# Patient Record
Sex: Male | Born: 1955 | Race: White | Hispanic: No | Marital: Married | Smoking: Former smoker
Health system: Southern US, Community
[De-identification: ages and names within clinical notes are randomized; demographics above are authoritative.]

## PROBLEM LIST (undated history)

## (undated) DIAGNOSIS — I2510 Atherosclerotic heart disease of native coronary artery without angina pectoris: Secondary | ICD-10-CM

## (undated) DIAGNOSIS — M1990 Unspecified osteoarthritis, unspecified site: Secondary | ICD-10-CM

## (undated) DIAGNOSIS — I509 Heart failure, unspecified: Secondary | ICD-10-CM

## (undated) DIAGNOSIS — J449 Chronic obstructive pulmonary disease, unspecified: Secondary | ICD-10-CM

## (undated) DIAGNOSIS — K219 Gastro-esophageal reflux disease without esophagitis: Secondary | ICD-10-CM

## (undated) DIAGNOSIS — IMO0001 Reserved for inherently not codable concepts without codable children: Secondary | ICD-10-CM

## (undated) DIAGNOSIS — J45909 Unspecified asthma, uncomplicated: Secondary | ICD-10-CM

## (undated) DIAGNOSIS — I4891 Unspecified atrial fibrillation: Secondary | ICD-10-CM

## (undated) DIAGNOSIS — I219 Acute myocardial infarction, unspecified: Secondary | ICD-10-CM

## (undated) DIAGNOSIS — I10 Essential (primary) hypertension: Secondary | ICD-10-CM

---

## 2003-08-09 HISTORY — PX: CHOLECYSTECTOMY: SHX55

## 2005-06-04 ENCOUNTER — Other Ambulatory Visit: Payer: Self-pay

## 2005-06-05 ENCOUNTER — Inpatient Hospital Stay: Payer: Self-pay | Admitting: Internal Medicine

## 2007-08-09 HISTORY — PX: CORONARY ANGIOGRAM: SHX5786

## 2007-08-09 HISTORY — PX: ANGIOPLASTY: SHX39

## 2007-08-26 ENCOUNTER — Other Ambulatory Visit: Payer: Self-pay

## 2007-08-27 ENCOUNTER — Other Ambulatory Visit: Payer: Self-pay

## 2007-08-27 ENCOUNTER — Inpatient Hospital Stay: Payer: Self-pay | Admitting: Internal Medicine

## 2007-08-30 ENCOUNTER — Other Ambulatory Visit: Payer: Self-pay

## 2007-09-03 ENCOUNTER — Other Ambulatory Visit: Payer: Self-pay

## 2007-09-18 ENCOUNTER — Ambulatory Visit: Payer: Self-pay | Admitting: Internal Medicine

## 2008-01-08 ENCOUNTER — Ambulatory Visit: Payer: Self-pay | Admitting: Internal Medicine

## 2008-01-17 ENCOUNTER — Ambulatory Visit: Payer: Self-pay | Admitting: Internal Medicine

## 2008-04-17 ENCOUNTER — Ambulatory Visit: Payer: Self-pay | Admitting: Internal Medicine

## 2010-12-17 ENCOUNTER — Ambulatory Visit: Payer: Self-pay

## 2011-01-06 ENCOUNTER — Ambulatory Visit: Payer: Self-pay

## 2011-06-01 ENCOUNTER — Inpatient Hospital Stay: Payer: Self-pay | Admitting: Internal Medicine

## 2011-06-01 ENCOUNTER — Ambulatory Visit: Payer: Self-pay | Admitting: Internal Medicine

## 2011-06-13 ENCOUNTER — Other Ambulatory Visit: Payer: Self-pay | Admitting: Nephrology

## 2013-07-18 ENCOUNTER — Inpatient Hospital Stay: Payer: Self-pay | Admitting: Internal Medicine

## 2013-07-18 LAB — CK TOTAL AND CKMB (NOT AT ARMC): CK, Total: 98 U/L (ref 35–232)

## 2013-07-18 LAB — COMPREHENSIVE METABOLIC PANEL
Albumin: 3.5 g/dL (ref 3.4–5.0)
Anion Gap: 7 (ref 7–16)
Bilirubin,Total: 1.3 mg/dL — ABNORMAL HIGH (ref 0.2–1.0)
Chloride: 101 mmol/L (ref 98–107)
Co2: 27 mmol/L (ref 21–32)
Creatinine: 0.76 mg/dL (ref 0.60–1.30)
EGFR (African American): >60
EGFR (Non-African Amer.): >60
Potassium: 3.8 mmol/L (ref 3.5–5.1)
SGPT (ALT): 32 U/L (ref 12–78)
Total Protein: 7.5 g/dL (ref 6.4–8.2)

## 2013-07-18 LAB — URINALYSIS, COMPLETE
Bacteria: NONE SEEN
Bilirubin,UR: NEGATIVE
Blood: NEGATIVE
Glucose,UR: NEGATIVE mg/dL (ref 0–75)
Nitrite: NEGATIVE
Specific Gravity: 1.013 (ref 1.003–1.030)

## 2013-07-18 LAB — CBC
HGB: 16.1 g/dL (ref 13.0–18.0)
MCH: 29.9 pg (ref 26.0–34.0)
MCHC: 33.1 g/dL (ref 32.0–36.0)
Platelet: 285 10*3/uL (ref 150–440)
RDW: 14.1 % (ref 11.5–14.5)
WBC: 15.5 10*3/uL — ABNORMAL HIGH (ref 3.8–10.6)

## 2013-07-18 LAB — LIPASE, BLOOD: Lipase: 861 U/L — ABNORMAL HIGH (ref 73–393)

## 2013-07-18 LAB — LIPID PANEL: Triglycerides: 53 mg/dL (ref 0–200)

## 2013-07-19 LAB — COMPREHENSIVE METABOLIC PANEL
Albumin: 2.8 g/dL — ABNORMAL LOW (ref 3.4–5.0)
Anion Gap: 5 — ABNORMAL LOW (ref 7–16)
Bilirubin,Total: 1.6 mg/dL — ABNORMAL HIGH (ref 0.2–1.0)
Calcium, Total: 8.4 mg/dL — ABNORMAL LOW (ref 8.5–10.1)
Chloride: 101 mmol/L (ref 98–107)
Co2: 29 mmol/L (ref 21–32)
Creatinine: 0.8 mg/dL (ref 0.60–1.30)
EGFR (Non-African Amer.): >60
Glucose: 86 mg/dL (ref 65–99)
Osmolality: 268 (ref 275–301)
Potassium: 3.9 mmol/L (ref 3.5–5.1)
SGPT (ALT): 32 U/L (ref 12–78)
Sodium: 135 mmol/L — ABNORMAL LOW (ref 136–145)
Total Protein: 6.6 g/dL (ref 6.4–8.2)

## 2013-07-19 LAB — CBC WITH DIFFERENTIAL/PLATELET
Basophil %: 0.3 %
Eosinophil %: 0.9 %
HCT: 42.2 % (ref 40.0–52.0)
HGB: 14.2 g/dL (ref 13.0–18.0)
Lymphocyte %: 16.7 %
MCH: 30.7 pg (ref 26.0–34.0)
MCHC: 33.7 g/dL (ref 32.0–36.0)
MCV: 91 fL (ref 80–100)
Monocyte %: 12.3 %
Neutrophil %: 69.8 %
RBC: 4.64 10*6/uL (ref 4.40–5.90)
WBC: 12.4 10*3/uL — ABNORMAL HIGH (ref 3.8–10.6)

## 2013-07-19 LAB — LIPASE, BLOOD: Lipase: 420 U/L — ABNORMAL HIGH (ref 73–393)

## 2013-07-19 LAB — MAGNESIUM: Magnesium: 1.5 mg/dL — ABNORMAL LOW

## 2013-07-29 ENCOUNTER — Other Ambulatory Visit: Payer: Self-pay | Admitting: Internal Medicine

## 2013-07-29 DIAGNOSIS — K219 Gastro-esophageal reflux disease without esophagitis: Secondary | ICD-10-CM

## 2013-09-04 ENCOUNTER — Other Ambulatory Visit: Payer: Self-pay

## 2014-04-23 ENCOUNTER — Ambulatory Visit: Payer: Self-pay

## 2014-07-08 ENCOUNTER — Ambulatory Visit: Payer: Self-pay | Admitting: Internal Medicine

## 2014-08-18 DIAGNOSIS — J439 Emphysema, unspecified: Secondary | ICD-10-CM | POA: Diagnosis not present

## 2014-08-18 DIAGNOSIS — J449 Chronic obstructive pulmonary disease, unspecified: Secondary | ICD-10-CM | POA: Diagnosis not present

## 2014-09-18 DIAGNOSIS — J449 Chronic obstructive pulmonary disease, unspecified: Secondary | ICD-10-CM | POA: Diagnosis not present

## 2014-09-18 DIAGNOSIS — J439 Emphysema, unspecified: Secondary | ICD-10-CM | POA: Diagnosis not present

## 2014-10-17 DIAGNOSIS — J439 Emphysema, unspecified: Secondary | ICD-10-CM | POA: Diagnosis not present

## 2014-10-17 DIAGNOSIS — J449 Chronic obstructive pulmonary disease, unspecified: Secondary | ICD-10-CM | POA: Diagnosis not present

## 2014-10-23 DIAGNOSIS — M064 Inflammatory polyarthropathy: Secondary | ICD-10-CM | POA: Diagnosis not present

## 2014-10-23 DIAGNOSIS — J449 Chronic obstructive pulmonary disease, unspecified: Secondary | ICD-10-CM | POA: Diagnosis not present

## 2014-10-23 DIAGNOSIS — Z9981 Dependence on supplemental oxygen: Secondary | ICD-10-CM | POA: Diagnosis not present

## 2014-10-23 DIAGNOSIS — I10 Essential (primary) hypertension: Secondary | ICD-10-CM | POA: Diagnosis not present

## 2014-11-12 DIAGNOSIS — J449 Chronic obstructive pulmonary disease, unspecified: Secondary | ICD-10-CM | POA: Diagnosis not present

## 2014-11-12 DIAGNOSIS — J9611 Chronic respiratory failure with hypoxia: Secondary | ICD-10-CM | POA: Diagnosis not present

## 2014-11-12 DIAGNOSIS — J309 Allergic rhinitis, unspecified: Secondary | ICD-10-CM | POA: Diagnosis not present

## 2014-11-12 DIAGNOSIS — R0602 Shortness of breath: Secondary | ICD-10-CM | POA: Diagnosis not present

## 2014-11-12 DIAGNOSIS — F17201 Nicotine dependence, unspecified, in remission: Secondary | ICD-10-CM | POA: Diagnosis not present

## 2014-11-17 DIAGNOSIS — J439 Emphysema, unspecified: Secondary | ICD-10-CM | POA: Diagnosis not present

## 2014-11-17 DIAGNOSIS — J449 Chronic obstructive pulmonary disease, unspecified: Secondary | ICD-10-CM | POA: Diagnosis not present

## 2014-11-28 NOTE — Consult Note (Signed)
PATIENT NAME:  Erik [name], Cotton L MR#:  [id number] DATE OF BIRTH:  Jul 17, 1956  DATE OF CONSULTATION:  07/18/2013  REFERRING PHYSICIAN:   CONSULTING PHYSICIAN:  Marcina MillardAlexander Keiasha Diep, MD  PRIMARY CARE PHYSICIAN: Beverely RisenFozia Khan, MD  PRIMARY CARDIOLOGIST: Arnoldo HookerBruce Kowalski, MD  CHIEF COMPLAINT: Abdominal pain.   REASON FOR CONSULTATION: Requested for evaluation of chest discomfort and atrial fibrillation.   HISTORY OF PRESENT ILLNESS: The patient is a 59 year old gentleman with known history of coronary artery disease status post prior MI and coronary stent as well as atrial fibrillation. The patient is noncompliant with his medications, has not taken any medications for over 7 months and has not seen his primary care physician or cardiologist. The patient reports a several day history of recurrent episodes of midepigastric discomfort described as burning sensation with mild reflux and abdominal distention. He presented [facility] Room where he was experiencing abdominal discomfort with some radiation into his chest. Admission labs were notable for negative troponin, elevated lipase of 86, and a white count of 15,000. EKG revealed atrial fibrillation with rapid ventricular rate.   PAST MEDICAL HISTORY: 1.  Coronary artery disease status post lateral MI, bare metal stent second obtuse marginal branch, 08/2007. 2.  Hypertension.  3.  Hyperlipidemia.  4.  Atrial fibrillation.  5.  Chronic obstructive pulmonary disease.  6.  Sleep apnea.   ADMISSION MEDICATIONS: None. The patient previously was taking Pradaxa 150 mg b.i.d., metoprolol ER 50 mg daily, Cartia XT 240 mg daily, Lasix 40 mg daily, and Bumetanide 1 daily.   SOCIAL HISTORY: The patient is married. He has a remote tobacco abuse history, quit 18 years ago.   FAMILY HISTORY: Father with known history of myocardial infarction.   REVIEW OF SYSTEMS: CONSTITUTIONAL: The patient has had some intermittent fever and chills.  EYES: No blurry  vision.  EARS: No hearing loss.  RESPIRATORY: The patient does have chronic exertional dyspnea.  CARDIOVASCULAR: The patient does have mid epigastric discomfort which radiates up into his chest. GASTROINTESTINAL: The patient has midepigastric discomfort, nausea and  vomiting.  GENITOURINARY: No dysuria or hematuria.  ENDOCRINE: No polyuria or polydipsia.  MUSCULOSKELETAL: No arthralgias or myalgias.  NEUROLOGICAL: No focal muscle weakness or numbness.  PSYCHOLOGICAL: No depression or anxiety.   PHYSICAL EXAMINATION: VITAL SIGNS: Blood pressure 149/79, pulse 85, respirations 18, temperature 97.3, and pulse ox 96%.  HEENT: Pupils equal and reactive to light and accommodation.  NECK: Supple without thyromegaly.  LUNGS: Clear.  HEART: Normal JVP. Normal PMI. Irregularly irregular rhythm. Normal S1 and S2. No appreciable gallop, murmur, or rub.  ABDOMEN: Soft and nontender.  EXTREMITIES: There was trace to 1+ bilateral pedal edema.  MUSCULOSKELETAL: Normal muscle tone.  NEUROLOGIC: The patient is alert and oriented x3. Motor and sensory both grossly intact.   IMPRESSION: A 59 year old gentleman with known coronary artery disease and atrial fibrillation. He has been noncompliant with his medications. The patient presents with mid epigastric discomfort and elevated lipase consistent with pancreatitis. The patient initially was hypertensive, which is now better controlled on current medications. The patient has a CHADS2 score of 1, previously on Pradaxa. Initial troponin is negative   RECOMMENDATIONS: 1.  Agree with overall current therapy. 2.  Would reinstate appropriate medications. The patient was previously on Cardizem and currently is on short acting Cardizem 30 mg daily p.o. q. 6. Once heart rate better tolerated, would switch to long-acting Cardizem CD 120 to 180 mg daily.  3.  Restart metoprolol succinate 50 mg daily.  4.  The patient has a CHADS2 score of 1. Could be on either aspirin or  chronic anticoagulation with warfarin or novel new anticoagulants. May consider continuing an aspirin since there is a question of compliance.  5.  Review 2-D echocardiogram.  6.  Further recommendations pending echocardiogram results.   ____________________________ Marcina Millard, MD ap:sb D: 07/18/2013 12:58:22 ET T: 07/18/2013 13:33:59 ET JOB#: [id number]  cc: Marcina Millard, MD, <Dictator> Marcina Millard MD ELECTRONICALLY SIGNED 07/26/2013 8:51

## 2014-11-28 NOTE — H&P (Signed)
PATIENT NAME:  Erik [name], Erik [name] MR#:  [id number] DATE OF BIRTH:  Oct 03, 1955  DATE OF ADMISSION:  07/18/2013  PRIMARY CARE PHYSICIAN:  Dr. Beverely Risen.   CARDIOLOGIST:  Dr. Gwen Pounds.   CHIEF COMPLAINT:  Chest pain and abdominal pain with indigestion.   HISTORY OF PRESENT ILLNESS:  This is a very nice 59 year old gentleman who has a history of multiple medical problems including chronic obstructive pulmonary disease, coronary artery disease, previous MI, hyperlipidemia, atrial fibrillation previously on Pradaxa and suspected sleep apnea. The patient comes today with a history of indigestion for the past 3 days, getting worse, but the wife states that for 7 days he has been actually feeling ingested. The patient states that last night he was not able to sleep due to the abdominal pain and indigestion. The abdominal pain was a burning sensation, radiating to the back with intensity of 5 or 6/10. The patient states that he had a little bit reflux on top of that and significant abdominal distention. The patient states that this morning, he started having actually chest pain located in the left lower area around the nipple area, deep inside, feeling like it was radiating down into the axilla. The intensity at the moment was 9/10. Right now it is 1 or 2/10. The patient had association with nausea but the nausea has been going on for 3 to 4 days. The patient states that there was not any diaphoresis associated with that. The patient had pain on his chest, which was cramp-like and the pain in the belly is burning like. The patient states that over the past 7 to 8 days, he has been having significant difficulty swallowing. He feels like something gets stuck in his chest. The patient is seen in the ER where his lipase is 886.  His cardiac enzymes are negative, and his white blood count is 15,000.  He has atrial fibrillation with RVR and has significant edema of the lower extremities with erythema, which is new within the  last 24 hours. He denies any fever but his white count is elevated. The patient states that he has been having a lot of family situations for which he has not take his medications in over 6 months. The patient is admitted for control and treatment of these situations.   REVIEW OF SYSTEMS:  A 12-system review of systems is done.  CONSTITUTIONAL:  No fever. Positive fatigue. Positive for shortness of breath, negative weight loss. Positive weight gain.  EYES:  No blurry vision, double vision or glaucoma.  ENT:  Positive difficulty swallowing foods. It gets stuck in his esophagus. No tinnitus. No epistaxis.  RESPIRATORY:  No cough. No wheezing at this moment but the patient has COPD and has significant exertional dyspnea.  CARDIOVASCULAR:  No orthopnea. Positive chest pain as mentioned above. Positive edemas. Positive arrhythmia. Positive palpitations, no syncope.  GASTROINTESTINAL:  Nausea, vomiting positive. No diarrhea. Positive abdominal pain as mentioned above. No constipation, diarrhea. The patient states that his stools a little bit hard, but he goes every day.  GENITOURINARY:  No dysuria, hematuria, changes in frequency.  ENDOCRINE:  No polyuria, polydipsia, polyphagia, cold or heat intolerance.  HEMATOLOGIC AND LYMPHATIC:  No anemia, easy bruising or swollen glands.  SKIN:  No rashes or petechiae.   NEUROLOGIC:  No numbness, tingling or CVAs.  PSYCHIATRIC:  No insomnia or depression.   PAST MEDICAL HISTORY:  1.  Coronary artery disease.  2.  Severe COPD.  3.  A history of previous MI.  4.  Hyperlipidemia.  5.  Chronic atrial fibrillation, supposed to be on anticoagulation with Pradaxa, but he is no longer taking.  6.  Sleep apnea, noncompliant with treatment.  7.  There is no documented history of CHF. Last echo in 2012 had a normal left ventricular ejection fraction of 50% with dilation of the atrium.   ALLERGIES:  BENADRYL and ZYRTEC, which apparently gave him anaphylaxis or  angioedema.   MEDICATIONS:  The patient is no longer taking any medications, but prior he was taking:  1.  Metoprolol ER 50. 2.  Cartia XT 240,  3.  Lasix 20 mg daily. 4.  Bumetanide. 5.  Pradaxa 150 twice daily but, again, he is no longer taking any of those.   FAMILY HISTORY:  Positive for a daughter 61 years old had a stroke. His father had a heart attack. There is history of lung cancer in his family as well.   PAST SURGICAL HISTORY:  1.  Cholecystectomy.  2.  Stent placement in coronary arteries.   SOCIAL HISTORY:  The patient is disabled due to his COPD. He states on the previous history that he was smoking two years ago, although the patient states that he has not smoked anything in 18 years. He lives with his wife.   PHYSICAL EXAMINATION:  VITAL SIGNS:  Blood pressures 150/81, pulse 117, respirations 24, temperature 98, oxygen saturation 100% on room air.  GENERAL:  Alert, oriented x 3, in no acute distress. No respiratory distress. Hemodynamically stable.  HEENT:  Pupils are equal and reactive. Extraocular movements are intact. Mucosa are moist. Anicteric sclerae. Pink conjunctivae. No oral lesions. No oropharyngeal exudates.   NECK:  Fat, supple. No JVD. No thyromegaly. No adenopathy. No rigidity.  CARDIOVASCULAR:  Irregular rate and rhythm. No murmurs, rubs or gallops are appreciated. No displacement of PMI. No tenderness to palpation anterior chest wall.  LUNGS:  Mostly clear without any wheezing or crepitus. No use of accessory muscles. No dullness to percussion.  ABDOMEN:  Soft, tender to palpation at the level of the epigastric area, but no rebound, no guarding. No hepatosplenomegaly. The patient has a very distended abdomen.  GENITAL:  Deferred.  EXTREMITIES:  Edema of the lower extremities with significant erythema on the pretibial areas, goes from the ankles to 2 inches below the knee. The patient states that this is new and it has been going on for the last 24 to 48  hours. The edemas are chronic.  VASCULAR:  Pulses +2. Capillary refill less than 3.  SKIN:  Erythema at the level of the pretibial areas as mentioned above. Plaque-like, no suppuration, the beginning of cellulitis. No petechiae.  LYMPHATIC:  Negative for lymphadenopathy in the neck or supraclavicular areas.  NEUROLOGIC:  Cranial nerves II through XII intact. Strength is 5/5 in all 4 extremities. No focal findings.  PSYCHIATRIC:  No significant agitation. Normal judgment. The patient is alert, oriented x 3.  MUSCULOSKELETAL:  No joint effusions or joint swelling.   LABORATORY, DIAGNOSTIC, AND RADIOLOGICAL DATA:  Glucose 122, sodium 135, creatinine 0.76. Lipase is 861, albumin is 1.3. LFTs within normal limits, otherwise. His white count is 15,000. His hemoglobin 16 and his platelet count is 285. Urinalysis is pending. Chest x-ray: changes of pulmonary disease, emphysematous changes, cardiomegaly, which is stable.   ASSESSMENT AND PLAN:  The patient is a very pleasant 59 year old gentleman, who presented to the ER with chest pain and abdominal digestion. 1.  Chest pain. Due to significant risk factors  and noncompliance with medication, we are going to try to rule out acute coronary syndrome, but the patient very likely has chest pain, which is secondary to radiation of the pain from the abdomen from pancreatitis, but again with his risk factors it is better to rule him out. We are going to do an echocardiogram. We are going to cycle cardiac enzymes. I am going to give him aspirin. Since the patient has severe indigestion, I am going to do buffered aspirin. We are going to give him a beta blocker, nitroglycerin and morphine as needed.  2.  Consultation with Dr. Gwen Pounds.  3.  Atrial fibrillation with rapid ventricular response. The patient has a heart rate in the 120s. At this moment, we are going to give him one dose of IV Cardizem. I think after that he will be able to go to the floor. I am going to put  him on IV metoprolol at 5 mg every 6 hours and start him on Cardizem 30 mg every 6 hours as well. With these interventions, the patient should be able to go to the telemetry floor.  4.  Since the patient has a CHADS score of around 3, we are going to give him full anticoagulation with Lovenox and monitor closely. His platelets are normal. The patient used to be on Pradaxa, has not taken it. He is at risk of stroke.  5.  As far as his abdominal pain and indigestion, the patient has pancreatitis. We are going to keep him n.p.o., IV fluids at 100 mL/hour, and monitor closely. The patient does not have a history congestive heart failure, but that was from an echocardiogram that was done 2 years ago when the patient was treatment. Right now he has not had any significant treatment or medications. We are going to repeat the echocardiogram and monitor for any fluid overload.  6.  Leukocytosis. The patient has erythema of the lower extremities, which is new, likely secondary to beginnings of cellulitis. He had leukocytosis in the past, seems to be chronic process for what we are going to follow up. I am going to start him on Rocephin for treatment of the cellulitis.  7.  Cellulitis. See above. Rocephin indicated at this moment.  8.  Chronic obstructive pulmonary disease. Continue nebulizers. The patient does not have any symptoms of exacerbation at this moment.  9.  Sleep apnea. The patient is intolerant to CPAP. He does not want to be on it.  10.  Hyperlipidemia. Check lipid profile. As far as the pancreatitis goes, the patient does not have gallbladder and he does not have a history of alcohol use for what we are going to rule out the possibility of hypertriglyceridemia. For now, we are going to treat his pancreatitis with IV fluids, symptomatic treatment.  11.  Deep vein thrombosis prophylaxis. The patient full dose of Lovenox.  12.  Gastrointestinal prophylaxis with Protonix.   TIME SPENT:  I spent about 50  minutes with this patient.   ____________________________ Felipa Furnace, MD rsg:jm D: 07/18/2013 11:11:39 ET T: 07/18/2013 11:44:01 ET JOB#: [id number]  cc: Felipa Furnace, MD, <Dictator> Nyellie Yetter Juanda Chance MD ELECTRONICALLY SIGNED 07/29/2013 12:25

## 2014-11-28 NOTE — Discharge Summary (Signed)
PATIENT NAME:  Erik [name], Erik [name] MR#:  [id number] DATE OF BIRTH:  13-May-1956  DATE OF ADMISSION:  07/18/2013 DATE OF DISCHARGE:  07/19/2013  PRIMARY CARE PROVIDER: Adrian BlackwaterShaukat Khan, MD  DISCHARGE DIAGNOSES:  1.  Acute pancreatitis, mild.  2.  Atrial fibrillation with rapid ventricular rate.  3.  Chronic respiratory failure.  4.  Chronic obstructive pulmonary disease.  5.  Noncompliance.   CONSULTS: Dr. Darrold JunkerParaschos of cardiology.   IMAGING STUDIES DONE: Include an echocardiogram which showed EF of 35% to 40%.   Chest x-ray, portable, shows no acute disease, emphysema.   ADMITTING HISTORY AND PHYSICAL: Please see detailed H and P dictated previously by Dr. Mordecai MaesSanchez. In brief, a 59 year old male patient with prior history of hypertension, systolic CHF, COPD, chronic respiratory failure, who presented to the hospital complaining of epigastric abdominal pain. The patient was found to have acute pancreatitis, also atrial fibrillation with rapid ventricular rate, chest pain. Admitted to the hospitalist service.   HOSPITAL COURSE:  1.  Acute pancreatitis, mild. The patient had lipase elevated at 810 which has trended down to 420. He has tolerated a regular diet with minimal pain and he is ready for discharge home. His ultrasound did not show any gallstones or dilation of CBD.  2.  Atrial fibrillation with rapid ventricular rate. The patient was supposed to be on Cartia and metoprolol but has been noncompliant with his medications for many days. This was restarted and his heart rate is less than 100. The patient will be on aspirin. Anticoagulation has been limited to aspirin not to Coumadin per Dr. Darrold JunkerParaschos' recommendation secondary to concern for noncompliance.  3.  Chronic systolic CHF, EF of 35% to 40%. The patient will be on a beta blocker, Lasix. No ACE inhibitors at this time secondary to borderline low blood pressures. The patient will be started on low-dose ACE inhibitors when he follows with his  cardiologist.  4.  COPD and chronic respiratory failure have been stable.  5.  The patient did have mild chest pain secondary to his pancreatitis. Dr. Darrold JunkerParaschos has cleared him from a cardiology standpoint.   Prior to discharge, the patient does not have any wheezing. Heart rate is 90 and the patient will be discharged home to follow up with his primary care physician.   DISCHARGE MEDICATIONS:  Include:  1.  Advair 250/50 inhaled twice a day.  2.  Spiriva 18 mcg inhaled once a day.  3.  Aspirin 325 mg daily.  4.  Metoprolol tartrate 50 mg oral twice a day.  5.  Cardizem CD 240 mg oral once a day.  6.  ProAir HFA 90 mcg per inhalation, 2 puffs inhaled 4 times a day as needed for wheezing.  7.  Lasix 20 mg daily.  8.  Amoxicillin clavulanate 875/125 mg oral 2 times a day.  9.  Acetaminophen oxycodone 325/5, one tablet oral 3 times a day as needed for pain.   DISCHARGE INSTRUCTIONS: Home oxygen with 3 liters of oxygen, continuous; low-sodium diet. Activity as tolerated.   FOLLOWUP: With Dr. Darrold JunkerParaschos of cardiology and primary care physician in 1 to 2 weeks.   TIME SPENT ON DAY OF DISCHARGE IN DISCHARGE ACTIVITY: Was 45 minutes.    ____________________________ Molinda BailiffSrikar R. Jamacia Jester, MD srs:np D: 07/19/2013 14:11:14 ET T: 07/19/2013 15:55:59 ET JOB#: [id number]  cc: Wardell HeathSrikar R. Elpidio AnisSudini, MD, <Dictator> Laurier NancyShaukat A. Khan, MD Marcina MillardAlexander Paraschos, MD Orie FishermanSRIKAR R Lene Mckay MD ELECTRONICALLY SIGNED 07/28/2013 10:37

## 2014-12-17 DIAGNOSIS — J439 Emphysema, unspecified: Secondary | ICD-10-CM | POA: Diagnosis not present

## 2014-12-17 DIAGNOSIS — J449 Chronic obstructive pulmonary disease, unspecified: Secondary | ICD-10-CM | POA: Diagnosis not present

## 2014-12-30 DIAGNOSIS — I10 Essential (primary) hypertension: Secondary | ICD-10-CM | POA: Diagnosis not present

## 2014-12-30 DIAGNOSIS — J440 Chronic obstructive pulmonary disease with acute lower respiratory infection: Secondary | ICD-10-CM | POA: Diagnosis not present

## 2014-12-30 DIAGNOSIS — Z9981 Dependence on supplemental oxygen: Secondary | ICD-10-CM | POA: Diagnosis not present

## 2014-12-30 DIAGNOSIS — J309 Allergic rhinitis, unspecified: Secondary | ICD-10-CM | POA: Diagnosis not present

## 2014-12-30 DIAGNOSIS — R0602 Shortness of breath: Secondary | ICD-10-CM | POA: Diagnosis not present

## 2015-01-17 DIAGNOSIS — J439 Emphysema, unspecified: Secondary | ICD-10-CM | POA: Diagnosis not present

## 2015-01-17 DIAGNOSIS — J449 Chronic obstructive pulmonary disease, unspecified: Secondary | ICD-10-CM | POA: Diagnosis not present

## 2015-02-02 ENCOUNTER — Ambulatory Visit
Admission: RE | Admit: 2015-02-02 | Discharge: 2015-02-02 | Disposition: A | Discharge status: Home / Self Care | Payer: Medicare Other | Source: Ambulatory Visit | Attending: Internal Medicine | Admitting: Internal Medicine

## 2015-02-02 ENCOUNTER — Other Ambulatory Visit: Payer: Self-pay | Admitting: Internal Medicine

## 2015-02-02 DIAGNOSIS — J440 Chronic obstructive pulmonary disease with acute lower respiratory infection: Secondary | ICD-10-CM | POA: Diagnosis not present

## 2015-02-02 DIAGNOSIS — R0602 Shortness of breath: Secondary | ICD-10-CM

## 2015-02-02 DIAGNOSIS — J449 Chronic obstructive pulmonary disease, unspecified: Secondary | ICD-10-CM | POA: Insufficient documentation

## 2015-02-02 DIAGNOSIS — R05 Cough: Secondary | ICD-10-CM | POA: Insufficient documentation

## 2015-02-02 DIAGNOSIS — Z9981 Dependence on supplemental oxygen: Secondary | ICD-10-CM | POA: Diagnosis not present

## 2015-02-02 DIAGNOSIS — J069 Acute upper respiratory infection, unspecified: Secondary | ICD-10-CM

## 2015-02-02 DIAGNOSIS — I2510 Atherosclerotic heart disease of native coronary artery without angina pectoris: Secondary | ICD-10-CM | POA: Diagnosis not present

## 2015-02-02 DIAGNOSIS — J309 Allergic rhinitis, unspecified: Secondary | ICD-10-CM | POA: Diagnosis not present

## 2015-02-05 DIAGNOSIS — R0602 Shortness of breath: Secondary | ICD-10-CM | POA: Diagnosis not present

## 2015-02-16 DIAGNOSIS — J449 Chronic obstructive pulmonary disease, unspecified: Secondary | ICD-10-CM | POA: Diagnosis not present

## 2015-02-16 DIAGNOSIS — J439 Emphysema, unspecified: Secondary | ICD-10-CM | POA: Diagnosis not present

## 2015-02-23 DIAGNOSIS — I10 Essential (primary) hypertension: Secondary | ICD-10-CM | POA: Diagnosis not present

## 2015-02-23 DIAGNOSIS — E782 Mixed hyperlipidemia: Secondary | ICD-10-CM | POA: Diagnosis not present

## 2015-02-23 DIAGNOSIS — I4891 Unspecified atrial fibrillation: Secondary | ICD-10-CM | POA: Diagnosis not present

## 2015-02-23 DIAGNOSIS — I2510 Atherosclerotic heart disease of native coronary artery without angina pectoris: Secondary | ICD-10-CM | POA: Diagnosis not present

## 2015-02-26 DIAGNOSIS — R079 Chest pain, unspecified: Secondary | ICD-10-CM | POA: Diagnosis not present

## 2015-03-02 DIAGNOSIS — I25119 Atherosclerotic heart disease of native coronary artery with unspecified angina pectoris: Secondary | ICD-10-CM | POA: Diagnosis not present

## 2015-03-02 DIAGNOSIS — I2510 Atherosclerotic heart disease of native coronary artery without angina pectoris: Secondary | ICD-10-CM | POA: Insufficient documentation

## 2015-03-12 DIAGNOSIS — J9611 Chronic respiratory failure with hypoxia: Secondary | ICD-10-CM | POA: Diagnosis not present

## 2015-03-12 DIAGNOSIS — I279 Pulmonary heart disease, unspecified: Secondary | ICD-10-CM | POA: Diagnosis not present

## 2015-03-12 DIAGNOSIS — J449 Chronic obstructive pulmonary disease, unspecified: Secondary | ICD-10-CM | POA: Diagnosis not present

## 2015-03-12 DIAGNOSIS — R0602 Shortness of breath: Secondary | ICD-10-CM | POA: Diagnosis not present

## 2015-03-18 ENCOUNTER — Ambulatory Visit
Admission: RE | Admit: 2015-03-18 | Discharge: 2015-03-18 | Disposition: A | Discharge status: Home / Self Care | Payer: Medicare Other | Source: Ambulatory Visit | Attending: Internal Medicine | Admitting: Internal Medicine

## 2015-03-18 ENCOUNTER — Encounter
Admission: RE | Disposition: A | Discharge status: Home / Self Care | Payer: Self-pay | Source: Ambulatory Visit | Attending: Internal Medicine

## 2015-03-18 ENCOUNTER — Encounter: Payer: Self-pay | Admitting: *Deleted

## 2015-03-18 DIAGNOSIS — K219 Gastro-esophageal reflux disease without esophagitis: Secondary | ICD-10-CM | POA: Insufficient documentation

## 2015-03-18 DIAGNOSIS — J439 Emphysema, unspecified: Secondary | ICD-10-CM | POA: Diagnosis not present

## 2015-03-18 DIAGNOSIS — Z823 Family history of stroke: Secondary | ICD-10-CM | POA: Diagnosis not present

## 2015-03-18 DIAGNOSIS — E785 Hyperlipidemia, unspecified: Secondary | ICD-10-CM | POA: Diagnosis not present

## 2015-03-18 DIAGNOSIS — I10 Essential (primary) hypertension: Secondary | ICD-10-CM | POA: Diagnosis not present

## 2015-03-18 DIAGNOSIS — E780 Pure hypercholesterolemia: Secondary | ICD-10-CM | POA: Insufficient documentation

## 2015-03-18 DIAGNOSIS — I25119 Atherosclerotic heart disease of native coronary artery with unspecified angina pectoris: Secondary | ICD-10-CM | POA: Diagnosis not present

## 2015-03-18 DIAGNOSIS — M1990 Unspecified osteoarthritis, unspecified site: Secondary | ICD-10-CM | POA: Insufficient documentation

## 2015-03-18 DIAGNOSIS — Z8249 Family history of ischemic heart disease and other diseases of the circulatory system: Secondary | ICD-10-CM | POA: Diagnosis not present

## 2015-03-18 DIAGNOSIS — Z79899 Other long term (current) drug therapy: Secondary | ICD-10-CM | POA: Insufficient documentation

## 2015-03-18 DIAGNOSIS — Z7982 Long term (current) use of aspirin: Secondary | ICD-10-CM | POA: Insufficient documentation

## 2015-03-18 DIAGNOSIS — Z888 Allergy status to other drugs, medicaments and biological substances status: Secondary | ICD-10-CM | POA: Diagnosis not present

## 2015-03-18 HISTORY — DX: Acute myocardial infarction, unspecified: I21.9

## 2015-03-18 HISTORY — DX: Essential (primary) hypertension: I10

## 2015-03-18 HISTORY — DX: Reserved for inherently not codable concepts without codable children: IMO0001

## 2015-03-18 HISTORY — DX: Atherosclerotic heart disease of native coronary artery without angina pectoris: I25.10

## 2015-03-18 HISTORY — DX: Unspecified asthma, uncomplicated: J45.909

## 2015-03-18 HISTORY — DX: Unspecified atrial fibrillation: I48.91

## 2015-03-18 HISTORY — DX: Unspecified osteoarthritis, unspecified site: M19.90

## 2015-03-18 HISTORY — DX: Chronic obstructive pulmonary disease, unspecified: J44.9

## 2015-03-18 HISTORY — PX: CARDIAC CATHETERIZATION: SHX172

## 2015-03-18 HISTORY — DX: Gastro-esophageal reflux disease without esophagitis: K21.9

## 2015-03-18 SURGERY — LEFT HEART CATH
Anesthesia: Moderate Sedation

## 2015-03-18 MED ORDER — HEPARIN (PORCINE) IN NACL 2-0.9 UNIT/ML-% IJ SOLN
INTRAMUSCULAR | Status: AC
  Filled 2015-03-18: qty 500

## 2015-03-18 MED ORDER — FENTANYL CITRATE (PF) 100 MCG/2ML IJ SOLN
INTRAMUSCULAR | Status: AC
  Filled 2015-03-18: qty 2

## 2015-03-18 MED ORDER — SODIUM CHLORIDE 0.9 % IV SOLN
INTRAVENOUS | Status: DC
  Administered 2015-03-18 (×2): via INTRAVENOUS

## 2015-03-18 MED ORDER — FENTANYL CITRATE (PF) 100 MCG/2ML IJ SOLN
INTRAMUSCULAR | Status: DC | PRN
  Administered 2015-03-18: 25 ug via INTRAVENOUS

## 2015-03-18 MED ORDER — MIDAZOLAM HCL 2 MG/2ML IJ SOLN
INTRAMUSCULAR | Status: DC | PRN
  Administered 2015-03-18: 1 mg via INTRAVENOUS

## 2015-03-18 MED ORDER — MIDAZOLAM HCL 2 MG/2ML IJ SOLN
INTRAMUSCULAR | Status: AC
  Filled 2015-03-18: qty 2

## 2015-03-18 MED ORDER — IOHEXOL 300 MG/ML  SOLN
INTRAMUSCULAR | Status: DC | PRN
  Administered 2015-03-18: 120 mL via INTRA_ARTERIAL

## 2015-03-18 MED ORDER — SODIUM CHLORIDE 0.9 % IJ SOLN: 3.0000 mL | Freq: Two times a day (BID) | INTRAMUSCULAR | Status: DC

## 2015-03-18 SURGICAL SUPPLY — 9 items
CATH INFINITI 5FR ANG PIGTAIL (CATHETERS) ×3 IMPLANT
CATH INFINITI 5FR JL4 (CATHETERS) ×3 IMPLANT
CATH INFINITI JR4 5F (CATHETERS) ×3 IMPLANT
DEVICE CLOSURE MYNXGRIP 5F (Vascular Products) ×3 IMPLANT
KIT MANI 3VAL PERCEP (MISCELLANEOUS) ×3 IMPLANT
NEEDLE PERC 18GX7CM (NEEDLE) ×3 IMPLANT
PACK CARDIAC CATH (CUSTOM PROCEDURE TRAY) ×3 IMPLANT
SHEATH PINNACLE 5F 10CM (SHEATH) ×3 IMPLANT
WIRE EMERALD 3MM-J .035X150CM (WIRE) ×3 IMPLANT

## 2015-03-18 NOTE — Discharge Instructions (Signed)
Coronary Angiogram °A coronary angiogram, also called coronary angiography, is an X-ray procedure used to look at the arteries in the heart. In this procedure, a dye (contrast dye) is injected through a long, hollow tube (catheter). The catheter is about the size of a piece of cooked spaghetti and is inserted through your groin, wrist, or arm. The dye is injected into each artery, and X-rays are then taken to show if there is a blockage in the arteries of your heart. °LET YOUR HEALTH CARE PROVIDER KNOW ABOUT: °· Any allergies you have, including allergies to shellfish or contrast dye.   °· All medicines you are taking, including vitamins, herbs, eye drops, creams, and over-the-counter medicines.   °· Previous problems you or members of your family have had with the use of anesthetics.   °· Any blood disorders you have.   °· Previous surgeries you have had. °· History of kidney problems or failure.   °· Other medical conditions you have. °RISKS AND COMPLICATIONS  °Generally, a coronary angiogram is a safe procedure. However, problems can occur and include: °· Allergic reaction to the dye. °· Bleeding from the access site or other locations. °· Kidney injury, especially in people with impaired kidney function.  °· Stroke (rare). °· Heart attack (rare). °BEFORE THE PROCEDURE  °· Do not eat or drink anything after midnight the night before the procedure or as directed by your health care provider.   °· Ask your health care provider about changing or stopping your regular medicines. This is especially important if you are taking diabetes medicines or blood thinners. °PROCEDURE °· You may be given a medicine to help you relax (sedative) before the procedure. This medicine is given through an intravenous (IV) access tube that is inserted into one of your veins.   °· The area where the catheter will be inserted will be washed and shaved. This is usually done in the groin but may be done in the fold of your arm (near your  elbow) or in the wrist.    °· A medicine will be given to numb the area where the catheter will be inserted (local anesthetic).   °· The health care provider will insert the catheter into an artery. The catheter will be guided by using a special type of X-ray (fluoroscopy) of the blood vessel being examined.   °· A special dye will then be injected into the catheter, and X-rays will be taken. The dye will help to show where any narrowing or blockages are located in the heart arteries.   °AFTER THE PROCEDURE  °· If the procedure is done through the leg, you will be kept in bed lying flat for several hours. You will be instructed to not bend or cross your legs. °· The insertion site will be checked frequently.   °· The pulse in your feet or wrist will be checked frequently.   °· Additional blood tests, X-rays, and an electrocardiogram may be done.   °Document Released: 01/29/2003 Document Revised: 12/09/2013 Document Reviewed: 12/17/2012 °[employer]® Patient Information ©2015 [employer]. This information is not intended to replace advice given to you by your health care provider. Make sure you discuss any questions you have with your health care provider. ° °

## 2015-03-27 DIAGNOSIS — I482 Chronic atrial fibrillation, unspecified: Secondary | ICD-10-CM | POA: Insufficient documentation

## 2015-03-27 DIAGNOSIS — I25119 Atherosclerotic heart disease of native coronary artery with unspecified angina pectoris: Secondary | ICD-10-CM | POA: Diagnosis not present

## 2015-03-27 DIAGNOSIS — I10 Essential (primary) hypertension: Secondary | ICD-10-CM | POA: Diagnosis not present

## 2015-03-27 DIAGNOSIS — E782 Mixed hyperlipidemia: Secondary | ICD-10-CM | POA: Diagnosis not present

## 2015-04-06 ENCOUNTER — Other Ambulatory Visit: Payer: Self-pay | Admitting: Physician Assistant

## 2015-04-06 DIAGNOSIS — R0602 Shortness of breath: Secondary | ICD-10-CM

## 2015-04-08 DIAGNOSIS — R0602 Shortness of breath: Secondary | ICD-10-CM | POA: Diagnosis not present

## 2015-04-19 DIAGNOSIS — J449 Chronic obstructive pulmonary disease, unspecified: Secondary | ICD-10-CM | POA: Diagnosis not present

## 2015-04-19 DIAGNOSIS — J439 Emphysema, unspecified: Secondary | ICD-10-CM | POA: Diagnosis not present

## 2015-04-28 DIAGNOSIS — J440 Chronic obstructive pulmonary disease with acute lower respiratory infection: Secondary | ICD-10-CM | POA: Diagnosis not present

## 2015-04-28 DIAGNOSIS — I10 Essential (primary) hypertension: Secondary | ICD-10-CM | POA: Diagnosis not present

## 2015-04-28 DIAGNOSIS — Z0001 Encounter for general adult medical examination with abnormal findings: Secondary | ICD-10-CM | POA: Diagnosis not present

## 2015-04-28 DIAGNOSIS — I279 Pulmonary heart disease, unspecified: Secondary | ICD-10-CM | POA: Diagnosis not present

## 2015-04-28 DIAGNOSIS — Z9981 Dependence on supplemental oxygen: Secondary | ICD-10-CM | POA: Diagnosis not present

## 2015-04-28 DIAGNOSIS — I2510 Atherosclerotic heart disease of native coronary artery without angina pectoris: Secondary | ICD-10-CM | POA: Diagnosis not present

## 2015-05-19 DIAGNOSIS — J449 Chronic obstructive pulmonary disease, unspecified: Secondary | ICD-10-CM | POA: Diagnosis not present

## 2015-06-19 DIAGNOSIS — J449 Chronic obstructive pulmonary disease, unspecified: Secondary | ICD-10-CM | POA: Diagnosis not present

## 2015-06-19 DIAGNOSIS — J439 Emphysema, unspecified: Secondary | ICD-10-CM | POA: Diagnosis not present

## 2015-07-19 DIAGNOSIS — J449 Chronic obstructive pulmonary disease, unspecified: Secondary | ICD-10-CM | POA: Diagnosis not present

## 2015-07-19 DIAGNOSIS — J439 Emphysema, unspecified: Secondary | ICD-10-CM | POA: Diagnosis not present

## 2015-08-13 DIAGNOSIS — J449 Chronic obstructive pulmonary disease, unspecified: Secondary | ICD-10-CM | POA: Diagnosis not present

## 2015-08-13 DIAGNOSIS — I25118 Atherosclerotic heart disease of native coronary artery with other forms of angina pectoris: Secondary | ICD-10-CM | POA: Diagnosis not present

## 2015-08-13 DIAGNOSIS — J9611 Chronic respiratory failure with hypoxia: Secondary | ICD-10-CM | POA: Diagnosis not present

## 2015-08-19 DIAGNOSIS — J439 Emphysema, unspecified: Secondary | ICD-10-CM | POA: Diagnosis not present

## 2015-08-19 DIAGNOSIS — J449 Chronic obstructive pulmonary disease, unspecified: Secondary | ICD-10-CM | POA: Diagnosis not present

## 2015-08-26 DIAGNOSIS — I10 Essential (primary) hypertension: Secondary | ICD-10-CM | POA: Diagnosis not present

## 2015-08-26 DIAGNOSIS — I482 Chronic atrial fibrillation: Secondary | ICD-10-CM | POA: Diagnosis not present

## 2015-08-26 DIAGNOSIS — Z9981 Dependence on supplemental oxygen: Secondary | ICD-10-CM | POA: Diagnosis not present

## 2015-08-26 DIAGNOSIS — J440 Chronic obstructive pulmonary disease with acute lower respiratory infection: Secondary | ICD-10-CM | POA: Diagnosis not present

## 2015-08-26 DIAGNOSIS — R0602 Shortness of breath: Secondary | ICD-10-CM | POA: Diagnosis not present

## 2015-09-19 DIAGNOSIS — J439 Emphysema, unspecified: Secondary | ICD-10-CM | POA: Diagnosis not present

## 2015-09-19 DIAGNOSIS — J449 Chronic obstructive pulmonary disease, unspecified: Secondary | ICD-10-CM | POA: Diagnosis not present

## 2015-10-17 DIAGNOSIS — J439 Emphysema, unspecified: Secondary | ICD-10-CM | POA: Diagnosis not present

## 2015-10-17 DIAGNOSIS — J449 Chronic obstructive pulmonary disease, unspecified: Secondary | ICD-10-CM | POA: Diagnosis not present

## 2015-11-17 DIAGNOSIS — J439 Emphysema, unspecified: Secondary | ICD-10-CM | POA: Diagnosis not present

## 2015-11-17 DIAGNOSIS — J449 Chronic obstructive pulmonary disease, unspecified: Secondary | ICD-10-CM | POA: Diagnosis not present

## 2015-12-17 DIAGNOSIS — J449 Chronic obstructive pulmonary disease, unspecified: Secondary | ICD-10-CM | POA: Diagnosis not present

## 2015-12-17 DIAGNOSIS — J439 Emphysema, unspecified: Secondary | ICD-10-CM | POA: Diagnosis not present

## 2015-12-24 DIAGNOSIS — I482 Chronic atrial fibrillation: Secondary | ICD-10-CM | POA: Diagnosis not present

## 2015-12-24 DIAGNOSIS — Z0001 Encounter for general adult medical examination with abnormal findings: Secondary | ICD-10-CM | POA: Diagnosis not present

## 2015-12-24 DIAGNOSIS — I2510 Atherosclerotic heart disease of native coronary artery without angina pectoris: Secondary | ICD-10-CM | POA: Diagnosis not present

## 2015-12-24 DIAGNOSIS — J449 Chronic obstructive pulmonary disease, unspecified: Secondary | ICD-10-CM | POA: Diagnosis not present

## 2015-12-24 DIAGNOSIS — I10 Essential (primary) hypertension: Secondary | ICD-10-CM | POA: Diagnosis not present

## 2015-12-24 DIAGNOSIS — Z9981 Dependence on supplemental oxygen: Secondary | ICD-10-CM | POA: Diagnosis not present

## 2015-12-31 ENCOUNTER — Other Ambulatory Visit: Payer: Self-pay | Admitting: Nurse Practitioner

## 2016-01-01 ENCOUNTER — Other Ambulatory Visit: Payer: Self-pay | Admitting: Nurse Practitioner

## 2016-01-01 DIAGNOSIS — N62 Hypertrophy of breast: Secondary | ICD-10-CM

## 2016-01-01 DIAGNOSIS — N649 Disorder of breast, unspecified: Secondary | ICD-10-CM

## 2016-01-12 ENCOUNTER — Other Ambulatory Visit: Payer: Medicare Other

## 2016-01-12 ENCOUNTER — Ambulatory Visit: Payer: Medicare Other

## 2016-01-17 DIAGNOSIS — J439 Emphysema, unspecified: Secondary | ICD-10-CM | POA: Diagnosis not present

## 2016-01-17 DIAGNOSIS — J449 Chronic obstructive pulmonary disease, unspecified: Secondary | ICD-10-CM | POA: Diagnosis not present

## 2016-02-08 ENCOUNTER — Ambulatory Visit
Admission: RE | Admit: 2016-02-08 | Discharge: 2016-02-08 | Disposition: A | Discharge status: Home / Self Care | Payer: Medicare Other | Source: Ambulatory Visit | Attending: Physician Assistant | Admitting: Physician Assistant

## 2016-02-08 ENCOUNTER — Other Ambulatory Visit: Payer: Self-pay | Admitting: Physician Assistant

## 2016-02-08 DIAGNOSIS — R5381 Other malaise: Secondary | ICD-10-CM | POA: Diagnosis not present

## 2016-02-08 DIAGNOSIS — R05 Cough: Secondary | ICD-10-CM

## 2016-02-08 DIAGNOSIS — R140 Abdominal distension (gaseous): Secondary | ICD-10-CM | POA: Diagnosis not present

## 2016-02-08 DIAGNOSIS — K5900 Constipation, unspecified: Secondary | ICD-10-CM | POA: Diagnosis not present

## 2016-02-08 DIAGNOSIS — I482 Chronic atrial fibrillation: Secondary | ICD-10-CM | POA: Diagnosis not present

## 2016-02-08 DIAGNOSIS — R059 Cough, unspecified: Secondary | ICD-10-CM

## 2016-02-08 DIAGNOSIS — R0602 Shortness of breath: Secondary | ICD-10-CM | POA: Diagnosis not present

## 2016-02-16 DIAGNOSIS — J439 Emphysema, unspecified: Secondary | ICD-10-CM | POA: Diagnosis not present

## 2016-02-16 DIAGNOSIS — J449 Chronic obstructive pulmonary disease, unspecified: Secondary | ICD-10-CM | POA: Diagnosis not present

## 2016-02-16 DIAGNOSIS — R140 Abdominal distension (gaseous): Secondary | ICD-10-CM | POA: Diagnosis not present

## 2016-02-16 DIAGNOSIS — K5900 Constipation, unspecified: Secondary | ICD-10-CM | POA: Diagnosis not present

## 2016-02-16 DIAGNOSIS — D72829 Elevated white blood cell count, unspecified: Secondary | ICD-10-CM | POA: Diagnosis not present

## 2016-02-17 DIAGNOSIS — E782 Mixed hyperlipidemia: Secondary | ICD-10-CM | POA: Diagnosis not present

## 2016-02-17 DIAGNOSIS — I25118 Atherosclerotic heart disease of native coronary artery with other forms of angina pectoris: Secondary | ICD-10-CM | POA: Diagnosis not present

## 2016-02-17 DIAGNOSIS — I482 Chronic atrial fibrillation: Secondary | ICD-10-CM | POA: Diagnosis not present

## 2016-03-02 DIAGNOSIS — R140 Abdominal distension (gaseous): Secondary | ICD-10-CM | POA: Diagnosis not present

## 2016-03-14 DIAGNOSIS — K760 Fatty (change of) liver, not elsewhere classified: Secondary | ICD-10-CM | POA: Diagnosis not present

## 2016-03-14 DIAGNOSIS — D72829 Elevated white blood cell count, unspecified: Secondary | ICD-10-CM | POA: Diagnosis not present

## 2016-03-14 DIAGNOSIS — I279 Pulmonary heart disease, unspecified: Secondary | ICD-10-CM | POA: Diagnosis not present

## 2016-03-14 DIAGNOSIS — K5900 Constipation, unspecified: Secondary | ICD-10-CM | POA: Diagnosis not present

## 2016-03-14 DIAGNOSIS — R160 Hepatomegaly, not elsewhere classified: Secondary | ICD-10-CM | POA: Diagnosis not present

## 2016-03-15 ENCOUNTER — Other Ambulatory Visit: Payer: Self-pay | Admitting: Physician Assistant

## 2016-03-15 DIAGNOSIS — R160 Hepatomegaly, not elsewhere classified: Secondary | ICD-10-CM

## 2016-03-18 DIAGNOSIS — J439 Emphysema, unspecified: Secondary | ICD-10-CM | POA: Diagnosis not present

## 2016-03-18 DIAGNOSIS — J449 Chronic obstructive pulmonary disease, unspecified: Secondary | ICD-10-CM | POA: Diagnosis not present

## 2016-03-25 ENCOUNTER — Ambulatory Visit
Admission: RE | Admit: 2016-03-25 | Discharge: 2016-03-25 | Disposition: A | Discharge status: Home / Self Care | Payer: Medicare Other | Source: Ambulatory Visit | Attending: Physician Assistant | Admitting: Physician Assistant

## 2016-03-25 DIAGNOSIS — R160 Hepatomegaly, not elsewhere classified: Secondary | ICD-10-CM | POA: Diagnosis not present

## 2016-03-25 DIAGNOSIS — I700 Atherosclerosis of aorta: Secondary | ICD-10-CM | POA: Insufficient documentation

## 2016-03-25 LAB — POCT I-STAT CREATININE: Creatinine, Ser: 0.8 mg/dL (ref 0.61–1.24)

## 2016-03-25 MED ORDER — IOPAMIDOL (ISOVUE-370) INJECTION 76%
100.0000 mL | Freq: Once | INTRAVENOUS | Status: AC | PRN
  Administered 2016-03-25: 100 mL via INTRAVENOUS

## 2016-04-12 DIAGNOSIS — K760 Fatty (change of) liver, not elsewhere classified: Secondary | ICD-10-CM | POA: Diagnosis not present

## 2016-04-12 DIAGNOSIS — D72829 Elevated white blood cell count, unspecified: Secondary | ICD-10-CM | POA: Diagnosis not present

## 2016-04-12 DIAGNOSIS — R160 Hepatomegaly, not elsewhere classified: Secondary | ICD-10-CM | POA: Diagnosis not present

## 2016-04-18 DIAGNOSIS — J449 Chronic obstructive pulmonary disease, unspecified: Secondary | ICD-10-CM | POA: Diagnosis not present

## 2016-04-18 DIAGNOSIS — J439 Emphysema, unspecified: Secondary | ICD-10-CM | POA: Diagnosis not present

## 2016-04-20 DIAGNOSIS — E782 Mixed hyperlipidemia: Secondary | ICD-10-CM | POA: Diagnosis not present

## 2016-04-20 DIAGNOSIS — I25118 Atherosclerotic heart disease of native coronary artery with other forms of angina pectoris: Secondary | ICD-10-CM | POA: Diagnosis not present

## 2016-04-20 DIAGNOSIS — I10 Essential (primary) hypertension: Secondary | ICD-10-CM | POA: Diagnosis not present

## 2016-04-20 DIAGNOSIS — I482 Chronic atrial fibrillation: Secondary | ICD-10-CM | POA: Diagnosis not present

## 2016-04-22 DIAGNOSIS — K760 Fatty (change of) liver, not elsewhere classified: Secondary | ICD-10-CM | POA: Diagnosis not present

## 2016-04-22 DIAGNOSIS — R7301 Impaired fasting glucose: Secondary | ICD-10-CM | POA: Diagnosis not present

## 2016-04-22 DIAGNOSIS — I5042 Chronic combined systolic (congestive) and diastolic (congestive) heart failure: Secondary | ICD-10-CM | POA: Diagnosis not present

## 2016-04-22 DIAGNOSIS — I10 Essential (primary) hypertension: Secondary | ICD-10-CM | POA: Diagnosis not present

## 2016-04-22 DIAGNOSIS — I2510 Atherosclerotic heart disease of native coronary artery without angina pectoris: Secondary | ICD-10-CM | POA: Diagnosis not present

## 2016-04-22 DIAGNOSIS — M064 Inflammatory polyarthropathy: Secondary | ICD-10-CM | POA: Diagnosis not present

## 2016-05-18 DIAGNOSIS — J439 Emphysema, unspecified: Secondary | ICD-10-CM | POA: Diagnosis not present

## 2016-05-18 DIAGNOSIS — J449 Chronic obstructive pulmonary disease, unspecified: Secondary | ICD-10-CM | POA: Diagnosis not present

## 2016-06-13 DIAGNOSIS — I482 Chronic atrial fibrillation: Secondary | ICD-10-CM | POA: Diagnosis not present

## 2016-06-13 DIAGNOSIS — E782 Mixed hyperlipidemia: Secondary | ICD-10-CM | POA: Diagnosis not present

## 2016-06-13 DIAGNOSIS — I25118 Atherosclerotic heart disease of native coronary artery with other forms of angina pectoris: Secondary | ICD-10-CM | POA: Diagnosis not present

## 2016-06-13 DIAGNOSIS — I10 Essential (primary) hypertension: Secondary | ICD-10-CM | POA: Diagnosis not present

## 2016-06-18 DIAGNOSIS — J439 Emphysema, unspecified: Secondary | ICD-10-CM | POA: Diagnosis not present

## 2016-06-18 DIAGNOSIS — J449 Chronic obstructive pulmonary disease, unspecified: Secondary | ICD-10-CM | POA: Diagnosis not present

## 2016-06-20 DIAGNOSIS — I482 Chronic atrial fibrillation: Secondary | ICD-10-CM | POA: Diagnosis not present

## 2016-07-18 DIAGNOSIS — J449 Chronic obstructive pulmonary disease, unspecified: Secondary | ICD-10-CM | POA: Diagnosis not present

## 2016-07-18 DIAGNOSIS — J439 Emphysema, unspecified: Secondary | ICD-10-CM | POA: Diagnosis not present

## 2016-08-18 DIAGNOSIS — J439 Emphysema, unspecified: Secondary | ICD-10-CM | POA: Diagnosis not present

## 2016-08-18 DIAGNOSIS — J449 Chronic obstructive pulmonary disease, unspecified: Secondary | ICD-10-CM | POA: Diagnosis not present

## 2016-09-18 DIAGNOSIS — J439 Emphysema, unspecified: Secondary | ICD-10-CM | POA: Diagnosis not present

## 2016-09-18 DIAGNOSIS — J449 Chronic obstructive pulmonary disease, unspecified: Secondary | ICD-10-CM | POA: Diagnosis not present

## 2016-10-16 DIAGNOSIS — J449 Chronic obstructive pulmonary disease, unspecified: Secondary | ICD-10-CM | POA: Diagnosis not present

## 2016-10-16 DIAGNOSIS — J439 Emphysema, unspecified: Secondary | ICD-10-CM | POA: Diagnosis not present

## 2016-11-16 DIAGNOSIS — J439 Emphysema, unspecified: Secondary | ICD-10-CM | POA: Diagnosis not present

## 2016-11-16 DIAGNOSIS — J449 Chronic obstructive pulmonary disease, unspecified: Secondary | ICD-10-CM | POA: Diagnosis not present

## 2016-12-15 DIAGNOSIS — J9611 Chronic respiratory failure with hypoxia: Secondary | ICD-10-CM | POA: Diagnosis not present

## 2016-12-15 DIAGNOSIS — I482 Chronic atrial fibrillation: Secondary | ICD-10-CM | POA: Diagnosis not present

## 2016-12-15 DIAGNOSIS — I279 Pulmonary heart disease, unspecified: Secondary | ICD-10-CM | POA: Diagnosis not present

## 2016-12-15 DIAGNOSIS — J449 Chronic obstructive pulmonary disease, unspecified: Secondary | ICD-10-CM | POA: Diagnosis not present

## 2016-12-15 DIAGNOSIS — Z9981 Dependence on supplemental oxygen: Secondary | ICD-10-CM | POA: Diagnosis not present

## 2016-12-16 DIAGNOSIS — J439 Emphysema, unspecified: Secondary | ICD-10-CM | POA: Diagnosis not present

## 2016-12-16 DIAGNOSIS — J449 Chronic obstructive pulmonary disease, unspecified: Secondary | ICD-10-CM | POA: Diagnosis not present

## 2017-01-16 DIAGNOSIS — J439 Emphysema, unspecified: Secondary | ICD-10-CM | POA: Diagnosis not present

## 2017-01-16 DIAGNOSIS — J449 Chronic obstructive pulmonary disease, unspecified: Secondary | ICD-10-CM | POA: Diagnosis not present

## 2017-01-18 DIAGNOSIS — R0602 Shortness of breath: Secondary | ICD-10-CM | POA: Diagnosis not present

## 2017-02-15 DIAGNOSIS — I6523 Occlusion and stenosis of bilateral carotid arteries: Secondary | ICD-10-CM | POA: Diagnosis not present

## 2017-02-15 DIAGNOSIS — J449 Chronic obstructive pulmonary disease, unspecified: Secondary | ICD-10-CM | POA: Diagnosis not present

## 2017-02-15 DIAGNOSIS — Z0001 Encounter for general adult medical examination with abnormal findings: Secondary | ICD-10-CM | POA: Diagnosis not present

## 2017-02-15 DIAGNOSIS — J439 Emphysema, unspecified: Secondary | ICD-10-CM | POA: Diagnosis not present

## 2017-02-15 DIAGNOSIS — I25118 Atherosclerotic heart disease of native coronary artery with other forms of angina pectoris: Secondary | ICD-10-CM | POA: Diagnosis not present

## 2017-02-15 DIAGNOSIS — Z125 Encounter for screening for malignant neoplasm of prostate: Secondary | ICD-10-CM | POA: Diagnosis not present

## 2017-02-15 DIAGNOSIS — M549 Dorsalgia, unspecified: Secondary | ICD-10-CM | POA: Diagnosis not present

## 2017-02-15 DIAGNOSIS — I279 Pulmonary heart disease, unspecified: Secondary | ICD-10-CM | POA: Diagnosis not present

## 2017-03-18 DIAGNOSIS — J449 Chronic obstructive pulmonary disease, unspecified: Secondary | ICD-10-CM | POA: Diagnosis not present

## 2017-03-18 DIAGNOSIS — J439 Emphysema, unspecified: Secondary | ICD-10-CM | POA: Diagnosis not present

## 2017-04-18 DIAGNOSIS — J449 Chronic obstructive pulmonary disease, unspecified: Secondary | ICD-10-CM | POA: Diagnosis not present

## 2017-04-18 DIAGNOSIS — J439 Emphysema, unspecified: Secondary | ICD-10-CM | POA: Diagnosis not present

## 2017-05-18 DIAGNOSIS — J449 Chronic obstructive pulmonary disease, unspecified: Secondary | ICD-10-CM | POA: Diagnosis not present

## 2017-05-18 DIAGNOSIS — J439 Emphysema, unspecified: Secondary | ICD-10-CM | POA: Diagnosis not present

## 2017-06-18 DIAGNOSIS — J449 Chronic obstructive pulmonary disease, unspecified: Secondary | ICD-10-CM | POA: Diagnosis not present

## 2017-06-18 DIAGNOSIS — J439 Emphysema, unspecified: Secondary | ICD-10-CM | POA: Diagnosis not present

## 2017-07-18 DIAGNOSIS — J449 Chronic obstructive pulmonary disease, unspecified: Secondary | ICD-10-CM | POA: Diagnosis not present

## 2017-07-18 DIAGNOSIS — J439 Emphysema, unspecified: Secondary | ICD-10-CM | POA: Diagnosis not present

## 2017-08-13 ENCOUNTER — Inpatient Hospital Stay
Admission: EM | Admit: 2017-08-13 | Discharge: 2017-08-19 | DRG: 190 | Disposition: A | Discharge status: Home Health | Payer: Medicare Other | Attending: Internal Medicine | Admitting: Internal Medicine

## 2017-08-13 ENCOUNTER — Encounter: Payer: Self-pay | Admitting: Emergency Medicine

## 2017-08-13 ENCOUNTER — Emergency Department: Payer: Medicare Other

## 2017-08-13 ENCOUNTER — Other Ambulatory Visit: Payer: Self-pay

## 2017-08-13 DIAGNOSIS — Z79899 Other long term (current) drug therapy: Secondary | ICD-10-CM | POA: Diagnosis not present

## 2017-08-13 DIAGNOSIS — R0603 Acute respiratory distress: Secondary | ICD-10-CM

## 2017-08-13 DIAGNOSIS — I4891 Unspecified atrial fibrillation: Secondary | ICD-10-CM | POA: Diagnosis not present

## 2017-08-13 DIAGNOSIS — J441 Chronic obstructive pulmonary disease with (acute) exacerbation: Principal | ICD-10-CM | POA: Diagnosis present

## 2017-08-13 DIAGNOSIS — I509 Heart failure, unspecified: Secondary | ICD-10-CM | POA: Diagnosis present

## 2017-08-13 DIAGNOSIS — Z7951 Long term (current) use of inhaled steroids: Secondary | ICD-10-CM | POA: Diagnosis not present

## 2017-08-13 DIAGNOSIS — Z9981 Dependence on supplemental oxygen: Secondary | ICD-10-CM

## 2017-08-13 DIAGNOSIS — J9601 Acute respiratory failure with hypoxia: Secondary | ICD-10-CM | POA: Diagnosis not present

## 2017-08-13 DIAGNOSIS — J209 Acute bronchitis, unspecified: Secondary | ICD-10-CM | POA: Diagnosis not present

## 2017-08-13 DIAGNOSIS — Z7982 Long term (current) use of aspirin: Secondary | ICD-10-CM

## 2017-08-13 DIAGNOSIS — I482 Chronic atrial fibrillation: Secondary | ICD-10-CM | POA: Diagnosis not present

## 2017-08-13 DIAGNOSIS — R079 Chest pain, unspecified: Secondary | ICD-10-CM | POA: Diagnosis not present

## 2017-08-13 DIAGNOSIS — I110 Hypertensive heart disease with heart failure: Secondary | ICD-10-CM | POA: Diagnosis present

## 2017-08-13 DIAGNOSIS — Z23 Encounter for immunization: Secondary | ICD-10-CM

## 2017-08-13 DIAGNOSIS — R0602 Shortness of breath: Secondary | ICD-10-CM | POA: Diagnosis not present

## 2017-08-13 DIAGNOSIS — I480 Paroxysmal atrial fibrillation: Secondary | ICD-10-CM | POA: Diagnosis not present

## 2017-08-13 DIAGNOSIS — R0902 Hypoxemia: Secondary | ICD-10-CM

## 2017-08-13 DIAGNOSIS — M5432 Sciatica, left side: Secondary | ICD-10-CM | POA: Diagnosis present

## 2017-08-13 DIAGNOSIS — I252 Old myocardial infarction: Secondary | ICD-10-CM | POA: Diagnosis not present

## 2017-08-13 DIAGNOSIS — J449 Chronic obstructive pulmonary disease, unspecified: Secondary | ICD-10-CM

## 2017-08-13 DIAGNOSIS — J9621 Acute and chronic respiratory failure with hypoxia: Secondary | ICD-10-CM | POA: Diagnosis present

## 2017-08-13 DIAGNOSIS — J440 Chronic obstructive pulmonary disease with acute lower respiratory infection: Secondary | ICD-10-CM | POA: Diagnosis not present

## 2017-08-13 DIAGNOSIS — I2510 Atherosclerotic heart disease of native coronary artery without angina pectoris: Secondary | ICD-10-CM | POA: Diagnosis present

## 2017-08-13 DIAGNOSIS — Z87891 Personal history of nicotine dependence: Secondary | ICD-10-CM | POA: Diagnosis not present

## 2017-08-13 DIAGNOSIS — J439 Emphysema, unspecified: Secondary | ICD-10-CM | POA: Diagnosis not present

## 2017-08-13 DIAGNOSIS — K219 Gastro-esophageal reflux disease without esophagitis: Secondary | ICD-10-CM | POA: Diagnosis not present

## 2017-08-13 HISTORY — DX: Heart failure, unspecified: I50.9

## 2017-08-13 LAB — BASIC METABOLIC PANEL
Anion gap: 10 (ref 5–15)
BUN: 14 mg/dL (ref 6–20)
CHLORIDE: 103 mmol/L (ref 101–111)
CO2: 24 mmol/L (ref 22–32)
CREATININE: 0.87 mg/dL (ref 0.61–1.24)
Calcium: 8.8 mg/dL — ABNORMAL LOW (ref 8.9–10.3)
GFR calc non Af Amer: >60 mL/min (ref >60)
Glucose, Bld: 119 mg/dL — ABNORMAL HIGH (ref 65–99)
Potassium: 4.5 mmol/L (ref 3.5–5.1)
Sodium: 137 mmol/L (ref 135–145)

## 2017-08-13 LAB — BLOOD GAS, VENOUS
Acid-base deficit: 0.2 mmol/L (ref 0.0–2.0)
Bicarbonate: 24.8 mmol/L (ref 20.0–28.0)
Delivery systems: POSITIVE
O2 Saturation: 93.9 %
Patient temperature: 37
pCO2, Ven: 41 mmHg — ABNORMAL LOW (ref 44.0–60.0)
pH, Ven: 7.39 (ref 7.250–7.430)
pO2, Ven: 71 mmHg — ABNORMAL HIGH (ref 32.0–45.0)

## 2017-08-13 LAB — URINALYSIS, COMPLETE (UACMP) WITH MICROSCOPIC
BACTERIA UA: NONE SEEN
Bilirubin Urine: NEGATIVE
GLUCOSE, UA: NEGATIVE mg/dL
KETONES UR: NEGATIVE mg/dL
LEUKOCYTES UA: NEGATIVE
NITRITE: NEGATIVE
PH: 5 (ref 5.0–8.0)
PROTEIN: 30 mg/dL — AB
Specific Gravity, Urine: 1.014 (ref 1.005–1.030)

## 2017-08-13 LAB — CBC WITH DIFFERENTIAL/PLATELET
Basophils Absolute: 0 10*3/uL (ref 0–0.1)
Basophils Relative: 0 %
EOS ABS: 0.1 10*3/uL (ref 0–0.7)
Eosinophils Relative: 2 %
HCT: 40.5 % (ref 40.0–52.0)
HEMOGLOBIN: 13.5 g/dL (ref 13.0–18.0)
LYMPHS ABS: 1.8 10*3/uL (ref 1.0–3.6)
LYMPHS PCT: 26 %
MCH: 30.3 pg (ref 26.0–34.0)
MCHC: 33.2 g/dL (ref 32.0–36.0)
MCV: 91.3 fL (ref 80.0–100.0)
Monocytes Absolute: 1.5 10*3/uL — ABNORMAL HIGH (ref 0.2–1.0)
Monocytes Relative: 21 %
NEUTROS PCT: 51 %
Neutro Abs: 3.5 10*3/uL (ref 1.4–6.5)
Platelets: 220 10*3/uL (ref 150–440)
RBC: 4.44 MIL/uL (ref 4.40–5.90)
RDW: 14.3 % (ref 11.5–14.5)
WBC: 6.9 10*3/uL (ref 3.8–10.6)

## 2017-08-13 LAB — INFLUENZA PANEL BY PCR (TYPE A & B)
INFLAPCR: NEGATIVE
Influenza B By PCR: NEGATIVE

## 2017-08-13 LAB — BRAIN NATRIURETIC PEPTIDE: B Natriuretic Peptide: 187 pg/mL — ABNORMAL HIGH (ref 0.0–100.0)

## 2017-08-13 LAB — LACTIC ACID, PLASMA: Lactic Acid, Venous: 1.3 mmol/L (ref 0.5–1.9)

## 2017-08-13 LAB — TROPONIN I

## 2017-08-13 MED ORDER — METHYLPREDNISOLONE SODIUM SUCC 125 MG IJ SOLR: 125.0000 mg | Freq: Once | INTRAMUSCULAR | Status: DC

## 2017-08-13 MED ORDER — METOPROLOL TARTRATE 25 MG PO TABS
25.0000 mg | ORAL_TABLET | Freq: Once | ORAL | Status: AC
  Administered 2017-08-13: 25 mg via ORAL
  Filled 2017-08-13: qty 1

## 2017-08-13 MED ORDER — METOPROLOL TARTRATE 5 MG/5ML IV SOLN
10.0000 mg | Freq: Once | INTRAVENOUS | Status: AC
  Administered 2017-08-13: 10 mg via INTRAVENOUS
  Filled 2017-08-13: qty 10

## 2017-08-13 MED ORDER — IPRATROPIUM-ALBUTEROL 0.5-2.5 (3) MG/3ML IN SOLN
3.0000 mL | Freq: Once | RESPIRATORY_TRACT | Status: AC
  Administered 2017-08-13: 3 mL via RESPIRATORY_TRACT
  Filled 2017-08-13: qty 3

## 2017-08-13 MED ORDER — METOPROLOL TARTRATE 5 MG/5ML IV SOLN: 5.0000 mg | Freq: Once | INTRAVENOUS | Status: DC

## 2017-08-13 MED ORDER — METOPROLOL TARTRATE 5 MG/5ML IV SOLN
INTRAVENOUS | Status: AC
  Administered 2017-08-13: 5 mg via INTRAVENOUS
  Filled 2017-08-13: qty 5

## 2017-08-13 MED ORDER — METHYLPREDNISOLONE SODIUM SUCC 125 MG IJ SOLR
INTRAMUSCULAR | Status: AC
  Filled 2017-08-13: qty 2

## 2017-08-13 MED ORDER — METOPROLOL TARTRATE 5 MG/5ML IV SOLN
5.0000 mg | Freq: Once | INTRAVENOUS | Status: AC
  Administered 2017-08-13: 5 mg via INTRAVENOUS
  Filled 2017-08-13: qty 5

## 2017-08-13 NOTE — ED Triage Notes (Signed)
EMS pt from home in respiratory distress; pt reports shortness of breath over the last few days that has worsened significantly in the last hour or 2; pt restless;  History of COPD; unable to talk in full sentences;

## 2017-08-13 NOTE — ED Notes (Signed)
meds given again   Pt alert  afib at 145   Pt on bipap.  Iv in place   Skin warm and dry

## 2017-08-13 NOTE — ED Notes (Signed)
Pt taken off bipap and placed on 3 liters oxygen via nasal cannula.

## 2017-08-13 NOTE — ED Provider Notes (Signed)
[facility] Emergency Department Provider Note ____________________________________________   First MD Initiated Contact with Patient 08/13/17 1939     (approximate)  I have reviewed the triage vital signs and the nursing notes.   HISTORY  Chief Complaint Respiratory Distress  History of present illness limited due to respiratory distress  HPI Erik [name] is a 62 y.o. male with past medical history as noted below including CHF and COPD who presents with shortness of breath, gradual onset over the last several days, worsening in the last 2 hours, and associated with nonproductive cough.  No associated fever.  Past Medical History:  Diagnosis Date  . Arthritis   . Asthma   . Atrial fibrillation (HCC)   . CHF (congestive heart failure) (HCC)   . COPD (chronic obstructive pulmonary disease) (HCC)   . Coronary artery disease   . GERD (gastroesophageal reflux disease)   . Hypertension   . Myocardial infarction (HCC)   . Shortness of breath dyspnea     There are no active problems to display for this patient.   Past Surgical History:  Procedure Laterality Date  . CARDIAC CATHETERIZATION N/A 03/18/2015   Procedure: Left Heart Cath with Coronary Angiography;  Surgeon: Lamar Blinks, MD;  Location: [facility] INVASIVE CV LAB;  Service: Cardiovascular;  Laterality: N/A;    Prior to Admission medications   Medication Sig Start Date End Date Taking? Authorizing Provider  albuterol-ipratropium (COMBIVENT) 18-103 MCG/ACT inhaler Inhale 2 puffs into the lungs every 4 (four) hours.   Yes [provider]  aspirin 325 MG tablet Take 325 mg by mouth daily.   Yes [provider]  diltiazem (CARDIZEM) 120 MG tablet Take 120 mg by mouth daily.   Yes [provider]  furosemide (LASIX) 20 MG tablet Take 20 mg by mouth daily.   Yes [provider]  loratadine (CLARITIN) 10 MG tablet Take 10 mg by mouth daily.   Yes [provider]    Allergies Benadryl [diphenhydramine hcl]  History reviewed. No pertinent family history.  Social History Social History   Tobacco Use  . Smoking status: Former Smoker    Years: 25.00    Last attempt to quit: 03/20/1997    Years since quitting: 20.4  . Smokeless tobacco: Never Used  Substance Use Topics  . Alcohol use: No  . Drug use: No    Review of Systems Level V caveat: Review of systems limited due to respiratory distress Constitutional: No fever. Cardiovascular: Positive for mild chest pain. Respiratory: Positive shortness of breath. Gastrointestinal: No vomiting.   Musculoskeletal: Negative for back pain. Skin: Negative for rash. Neurological: Negative for headache.   ____________________________________________   PHYSICAL EXAM:  VITAL SIGNS: ED Triage Vitals  Enc Vitals Group     BP 08/13/17 1934 (!) 142/91     Pulse --      Resp 08/13/17 1934 (!) 38     Temp --      Temp src --      SpO2 08/13/17 1934 100 %     Weight 08/13/17 1935 260 lb 7 oz (118.1 kg)     Height 08/13/17 1935 5\' 10"  (1.778 m)     Head Circumference --      Peak Flow --      Pain Score --      Pain Loc --      Pain Edu? --      Excl. in GC? --     Constitutional:  Alert and oriented.  Uncomfortable appearing and in some respiratory distress. Speaking in short sentences. Eyes: Conjunctivae are normal.  EOMI. Head: Atraumatic. Nose: No congestion/rhinnorhea. Mouth/Throat: Mucous membranes are slightly dry.   Neck: Normal range of motion.  Cardiovascular: Tachycardic, irregular rhythm. Good peripheral circulation. Respiratory: Increased respiratory effort.  Decreased breath sounds bilaterally with scattered rhonchi and coarse sounds. Gastrointestinal: Soft and nontender.  Genitourinary: No flank tenderness. Musculoskeletal: Trace bilateral lower extremity edema.  Extremities warm and well perfused.  Neurologic:  Normal speech and language.  Motor intact in all  extremities.  No gross focal neurologic deficits are appreciated.  Skin:  Skin is warm and dry. No rash noted. Psychiatric: Mood and affect are normal. Speech and behavior are normal.  ____________________________________________   LABS (all labs ordered are listed, but only abnormal results are displayed)  Labs Reviewed  BASIC METABOLIC PANEL - Abnormal; Notable for the following components:      Result Value   Glucose, Bld 119 (*)    Calcium 8.8 (*)    All other components within normal limits  CBC WITH DIFFERENTIAL/PLATELET - Abnormal; Notable for the following components:   Monocytes Absolute 1.5 (*)    All other components within normal limits  BRAIN NATRIURETIC PEPTIDE - Abnormal; Notable for the following components:   B Natriuretic Peptide 187.0 (*)    All other components within normal limits  BLOOD GAS, VENOUS - Abnormal; Notable for the following components:   pCO2, Ven 41 (*)    pO2, Ven 71.0 (*)    All other components within normal limits  CULTURE, BLOOD (ROUTINE X 2)  CULTURE, BLOOD (ROUTINE X 2)  TROPONIN I  LACTIC ACID, PLASMA  INFLUENZA PANEL BY PCR (TYPE A & B)  LACTIC ACID, PLASMA  URINALYSIS, COMPLETE (UACMP) WITH MICROSCOPIC   ____________________________________________  EKG ED ECG REPORT I, Dionne BucySebastian Keidy Thurgood, the attending physician, personally viewed and interpreted this ECG.  Date: 08/13/2017 EKG Time: 1941 Rate: 128 Rhythm: Atrial fibrillation with RVR QRS Axis: normal Intervals: normal ST/T Wave abnormalities: Nonspecific inferior abnormalities Narrative Interpretation: no evidence of acute ischemia; no recent previous EKG available for comparison ____________________________________________  RADIOLOGY  CXR: Cardiomegaly with no evidence of pulmonary edema no focal infiltrate  ____________________________________________   PROCEDURES  Procedure(s) performed: No    Critical Care performed: Yes  CRITICAL CARE Performed by:  Dionne BucySebastian Banita Lehn   Total critical care time: 45 minutes  Critical care time was exclusive of separately billable procedures and treating other patients.  Critical care was necessary to treat or prevent imminent or life-threatening deterioration.  Critical care was time spent personally by me on the following activities: development of treatment plan with patient and/or surrogate as well as nursing, discussions with consultants, evaluation of patient's response to treatment, examination of patient, obtaining history from patient or surrogate, ordering and performing treatments and interventions, ordering and review of laboratory studies, ordering and review of radiographic studies, pulse oximetry and re-evaluation of patient's condition. ____________________________________________   INITIAL IMPRESSION / ASSESSMENT AND PLAN / ED COURSE  Pertinent labs & imaging results that were available during my care of the patient were reviewed by me and considered in my medical decision making (see chart for details).  62 year old male with history of COPD and CHF and other past medical history as noted above presents with gradual onset of shortness of breath over the last several days with acute worsening in the last few hours.  On arrival the patient is extremely uncomfortable appearing, tachypneic, and  in some respiratory distress.  I did not see his room air O2 sat but on nonrebreather he was at 100%.  Heart rate was noted to be as high as the 190s in rapid A. fib, and his respiratory rate was in the 30s and 40s.  Lungs with decreased breath sounds bilaterally and some rhonchi, but no obvious rales or wheeze.  I performed a bedside ultrasound to evaluate the lungs; there were some B-lines in the lower lobes to suggest possible edema but overall the pattern was not consistent with diffuse APE.  Past medical records reviewed in Epic; patient was most recently admitted in 2014 for pancreatitis and  atrial fibrillation with COPD.  Overall presentation based on the vital signs, exam, and bedside ultrasound findings, is most consistent with acute COPD with resulting A. fib with RVR.  I have a lower suspicion for acute CHF given that the patient has no obvious rales, and is not significantly hypertensive.  Also consider pneumonia, bronchitis, influenza.  Due to the work of breathing I placed the patient on BiPAP, and will give nebs; steroid given by EMS.  We will obtain chest x-ray and lab workup to determine the diagnosis.  Given his extremely elevated heart rate I gave metoprolol IV for rate control.   ----------------------------------------- 10:59 PM on 08/13/2017 -----------------------------------------  Patient has been weaned off of the BiPAP is now comfortable on nasal cannula although still somewhat tachypneic.  Maintaining good O2 sat with nasal cannula.  Chest x-ray does not show focal infiltrate and there is no evidence of acute CHF from the current workup.  Heart rate is significantly improved after metoprolol.  Given the patient's respiratory distress and severity of his exacerbation, we will proceed with admission.  I signed the patient out to the hospitalist Dr. Caryn Bee.  ____________________________________________   FINAL CLINICAL IMPRESSION(S) / ED DIAGNOSES  Final diagnoses:  COPD exacerbation (HCC)  Atrial fibrillation, unspecified type (HCC)  Respiratory distress      NEW MEDICATIONS STARTED DURING THIS VISIT:  This SmartLink is deprecated. Use AVSMEDLIST instead to display the medication list for a patient.   Note:  This document was prepared using Dragon voice recognition software and may include unintentional dictation errors.    Dionne Bucy, MD 08/13/17 2300

## 2017-08-13 NOTE — ED Notes (Signed)
Pt waiting on admission.

## 2017-08-14 ENCOUNTER — Inpatient Hospital Stay
Admit: 2017-08-14 | Discharge: 2017-08-14 | Disposition: A | Discharge status: Home / Self Care | Payer: Medicare Other | Attending: Internal Medicine | Admitting: Internal Medicine

## 2017-08-14 DIAGNOSIS — J441 Chronic obstructive pulmonary disease with (acute) exacerbation: Secondary | ICD-10-CM | POA: Diagnosis present

## 2017-08-14 DIAGNOSIS — J9621 Acute and chronic respiratory failure with hypoxia: Secondary | ICD-10-CM | POA: Diagnosis present

## 2017-08-14 DIAGNOSIS — I110 Hypertensive heart disease with heart failure: Secondary | ICD-10-CM | POA: Diagnosis present

## 2017-08-14 DIAGNOSIS — Z23 Encounter for immunization: Secondary | ICD-10-CM | POA: Diagnosis not present

## 2017-08-14 DIAGNOSIS — Z87891 Personal history of nicotine dependence: Secondary | ICD-10-CM | POA: Diagnosis not present

## 2017-08-14 DIAGNOSIS — M5432 Sciatica, left side: Secondary | ICD-10-CM | POA: Diagnosis present

## 2017-08-14 DIAGNOSIS — R0602 Shortness of breath: Secondary | ICD-10-CM | POA: Diagnosis present

## 2017-08-14 DIAGNOSIS — J209 Acute bronchitis, unspecified: Secondary | ICD-10-CM | POA: Diagnosis present

## 2017-08-14 DIAGNOSIS — K219 Gastro-esophageal reflux disease without esophagitis: Secondary | ICD-10-CM | POA: Diagnosis present

## 2017-08-14 DIAGNOSIS — Z7951 Long term (current) use of inhaled steroids: Secondary | ICD-10-CM | POA: Diagnosis not present

## 2017-08-14 DIAGNOSIS — Z79899 Other long term (current) drug therapy: Secondary | ICD-10-CM | POA: Diagnosis not present

## 2017-08-14 DIAGNOSIS — I482 Chronic atrial fibrillation: Secondary | ICD-10-CM | POA: Diagnosis present

## 2017-08-14 DIAGNOSIS — I509 Heart failure, unspecified: Secondary | ICD-10-CM | POA: Diagnosis present

## 2017-08-14 DIAGNOSIS — Z9981 Dependence on supplemental oxygen: Secondary | ICD-10-CM | POA: Diagnosis not present

## 2017-08-14 DIAGNOSIS — J449 Chronic obstructive pulmonary disease, unspecified: Secondary | ICD-10-CM | POA: Diagnosis present

## 2017-08-14 DIAGNOSIS — J440 Chronic obstructive pulmonary disease with acute lower respiratory infection: Secondary | ICD-10-CM | POA: Diagnosis present

## 2017-08-14 DIAGNOSIS — Z7982 Long term (current) use of aspirin: Secondary | ICD-10-CM | POA: Diagnosis not present

## 2017-08-14 DIAGNOSIS — R0902 Hypoxemia: Secondary | ICD-10-CM

## 2017-08-14 DIAGNOSIS — I2510 Atherosclerotic heart disease of native coronary artery without angina pectoris: Secondary | ICD-10-CM | POA: Diagnosis present

## 2017-08-14 DIAGNOSIS — I252 Old myocardial infarction: Secondary | ICD-10-CM | POA: Diagnosis not present

## 2017-08-14 LAB — BASIC METABOLIC PANEL
ANION GAP: 12 (ref 5–15)
BUN: 13 mg/dL (ref 6–20)
CHLORIDE: 100 mmol/L — AB (ref 101–111)
CO2: 25 mmol/L (ref 22–32)
CREATININE: 0.76 mg/dL (ref 0.61–1.24)
Calcium: 9.1 mg/dL (ref 8.9–10.3)
GFR calc non Af Amer: >60 mL/min (ref >60)
Glucose, Bld: 170 mg/dL — ABNORMAL HIGH (ref 65–99)
Potassium: 4.1 mmol/L (ref 3.5–5.1)
SODIUM: 137 mmol/L (ref 135–145)

## 2017-08-14 LAB — CBC
HCT: 50.3 % (ref 40.0–52.0)
HEMOGLOBIN: 16.4 g/dL (ref 13.0–18.0)
MCH: 30.1 pg (ref 26.0–34.0)
MCHC: 32.5 g/dL (ref 32.0–36.0)
MCV: 92.5 fL (ref 80.0–100.0)
Platelets: 329 10*3/uL (ref 150–440)
RBC: 5.44 MIL/uL (ref 4.40–5.90)
RDW: 14.7 % — AB (ref 11.5–14.5)
WBC: 8.3 10*3/uL (ref 3.8–10.6)

## 2017-08-14 LAB — GLUCOSE, CAPILLARY
Glucose-Capillary: 132 mg/dL — ABNORMAL HIGH (ref 65–99)
Glucose-Capillary: 147 mg/dL — ABNORMAL HIGH (ref 65–99)

## 2017-08-14 MED ORDER — FUROSEMIDE 10 MG/ML IJ SOLN
40.0000 mg | Freq: Once | INTRAMUSCULAR | Status: AC
  Administered 2017-08-14: 40 mg via INTRAVENOUS
  Filled 2017-08-14: qty 4

## 2017-08-14 MED ORDER — ENOXAPARIN SODIUM 40 MG/0.4ML ~~LOC~~ SOLN
40.0000 mg | SUBCUTANEOUS | Status: DC
  Administered 2017-08-14 – 2017-08-18 (×5): 40 mg via SUBCUTANEOUS
  Filled 2017-08-14 (×5): qty 0.4

## 2017-08-14 MED ORDER — ONDANSETRON HCL 4 MG PO TABS: 4.0000 mg | ORAL_TABLET | Freq: Four times a day (QID) | ORAL | Status: DC | PRN

## 2017-08-14 MED ORDER — METOPROLOL TARTRATE 25 MG PO TABS
25.0000 mg | ORAL_TABLET | Freq: Two times a day (BID) | ORAL | Status: DC
  Administered 2017-08-14 – 2017-08-19 (×10): 25 mg via ORAL
  Filled 2017-08-14 (×10): qty 1

## 2017-08-14 MED ORDER — METOPROLOL TARTRATE 5 MG/5ML IV SOLN
5.0000 mg | INTRAVENOUS | Status: DC | PRN
  Administered 2017-08-14: 5 mg via INTRAVENOUS
  Filled 2017-08-14: qty 5

## 2017-08-14 MED ORDER — DILTIAZEM HCL ER COATED BEADS 120 MG PO CP24
120.0000 mg | ORAL_CAPSULE | Freq: Every day | ORAL | Status: DC
Start: 2017-08-14 — End: 2017-08-15
  Administered 2017-08-14 – 2017-08-15 (×2): 120 mg via ORAL
  Filled 2017-08-14 (×2): qty 1

## 2017-08-14 MED ORDER — ISOSORBIDE MONONITRATE ER 30 MG PO TB24
30.0000 mg | ORAL_TABLET | Freq: Every day | ORAL | Status: DC
  Administered 2017-08-14 – 2017-08-15 (×2): 15 mg via ORAL
  Administered 2017-08-16 – 2017-08-19 (×4): 30 mg via ORAL
  Filled 2017-08-14 (×6): qty 1

## 2017-08-14 MED ORDER — MELATONIN 5 MG PO TABS
5.0000 mg | ORAL_TABLET | Freq: Every evening | ORAL | Status: DC | PRN
  Administered 2017-08-16: 5 mg via ORAL
  Filled 2017-08-14 (×2): qty 1

## 2017-08-14 MED ORDER — BISACODYL 5 MG PO TBEC: 5.0000 mg | DELAYED_RELEASE_TABLET | Freq: Every day | ORAL | Status: DC | PRN

## 2017-08-14 MED ORDER — SODIUM CHLORIDE 0.9% FLUSH
3.0000 mL | Freq: Two times a day (BID) | INTRAVENOUS | Status: DC
  Administered 2017-08-14 – 2017-08-18 (×9): 3 mL via INTRAVENOUS

## 2017-08-14 MED ORDER — ASPIRIN 325 MG PO TABS
325.0000 mg | ORAL_TABLET | Freq: Every day | ORAL | Status: DC
  Administered 2017-08-14 – 2017-08-19 (×6): 325 mg via ORAL
  Filled 2017-08-14 (×6): qty 1

## 2017-08-14 MED ORDER — METHYLPREDNISOLONE SODIUM SUCC 125 MG IJ SOLR
80.0000 mg | Freq: Four times a day (QID) | INTRAMUSCULAR | Status: DC
  Administered 2017-08-14 – 2017-08-18 (×19): 80 mg via INTRAVENOUS
  Filled 2017-08-14 (×19): qty 2

## 2017-08-14 MED ORDER — FUROSEMIDE 20 MG PO TABS
20.0000 mg | ORAL_TABLET | Freq: Every day | ORAL | Status: DC
  Administered 2017-08-14 – 2017-08-19 (×6): 20 mg via ORAL
  Filled 2017-08-14 (×6): qty 1

## 2017-08-14 MED ORDER — INFLUENZA VAC SPLIT QUAD 0.5 ML IM SUSY
0.5000 mL | PREFILLED_SYRINGE | INTRAMUSCULAR | Status: AC
  Administered 2017-08-15: 0.5 mL via INTRAMUSCULAR
  Filled 2017-08-14: qty 0.5

## 2017-08-14 MED ORDER — ACETAMINOPHEN 325 MG PO TABS
650.0000 mg | ORAL_TABLET | Freq: Four times a day (QID) | ORAL | Status: DC | PRN
  Administered 2017-08-14: 650 mg via ORAL
  Filled 2017-08-14: qty 2

## 2017-08-14 MED ORDER — GUAIFENESIN ER 600 MG PO TB12
600.0000 mg | ORAL_TABLET | Freq: Two times a day (BID) | ORAL | Status: DC
  Administered 2017-08-14 – 2017-08-19 (×11): 600 mg via ORAL
  Filled 2017-08-14 (×11): qty 1

## 2017-08-14 MED ORDER — ACETAMINOPHEN 650 MG RE SUPP: 650.0000 mg | Freq: Four times a day (QID) | RECTAL | Status: DC | PRN

## 2017-08-14 MED ORDER — DEXTROSE 5 % IV SOLN
1.0000 g | INTRAVENOUS | Status: DC
  Administered 2017-08-14 – 2017-08-18 (×5): 1 g via INTRAVENOUS
  Filled 2017-08-14 (×5): qty 10

## 2017-08-14 MED ORDER — IPRATROPIUM-ALBUTEROL 0.5-2.5 (3) MG/3ML IN SOLN
3.0000 mL | Freq: Four times a day (QID) | RESPIRATORY_TRACT | Status: DC
  Administered 2017-08-14 – 2017-08-19 (×20): 3 mL via RESPIRATORY_TRACT
  Filled 2017-08-14 (×22): qty 3

## 2017-08-14 MED ORDER — LORATADINE 10 MG PO TABS
10.0000 mg | ORAL_TABLET | Freq: Every day | ORAL | Status: DC
  Administered 2017-08-16 – 2017-08-19 (×4): 10 mg via ORAL
  Filled 2017-08-14 (×5): qty 1

## 2017-08-14 MED ORDER — PHENOL 1.4 % MT LIQD
1.0000 | OROMUCOSAL | Status: DC | PRN
  Filled 2017-08-14: qty 177

## 2017-08-14 MED ORDER — DEXTROSE 5 % IV SOLN
500.0000 mg | INTRAVENOUS | Status: DC
  Administered 2017-08-14 – 2017-08-17 (×4): 500 mg via INTRAVENOUS
  Filled 2017-08-14 (×4): qty 500

## 2017-08-14 MED ORDER — HYDROCODONE-ACETAMINOPHEN 5-325 MG PO TABS
1.0000 | ORAL_TABLET | ORAL | Status: DC | PRN
  Administered 2017-08-16: 1 via ORAL
  Filled 2017-08-14: qty 1

## 2017-08-14 MED ORDER — HEPARIN SODIUM (PORCINE) 5000 UNIT/ML IJ SOLN
5000.0000 [IU] | Freq: Three times a day (TID) | INTRAMUSCULAR | Status: DC
  Administered 2017-08-14: 5000 [IU] via SUBCUTANEOUS
  Filled 2017-08-14 (×2): qty 1

## 2017-08-14 MED ORDER — IPRATROPIUM-ALBUTEROL 0.5-2.5 (3) MG/3ML IN SOLN
3.0000 mL | Freq: Four times a day (QID) | RESPIRATORY_TRACT | Status: DC | PRN
  Filled 2017-08-14: qty 3

## 2017-08-14 MED ORDER — ONDANSETRON HCL 4 MG/2ML IJ SOLN: 4.0000 mg | Freq: Four times a day (QID) | INTRAMUSCULAR | Status: DC | PRN

## 2017-08-14 MED ORDER — DOCUSATE SODIUM 100 MG PO CAPS
100.0000 mg | ORAL_CAPSULE | Freq: Two times a day (BID) | ORAL | Status: DC
  Administered 2017-08-16 – 2017-08-19 (×3): 100 mg via ORAL
  Filled 2017-08-14 (×9): qty 1

## 2017-08-14 NOTE — ED Notes (Addendum)
Transport patient to floor 2A - 239.AS

## 2017-08-14 NOTE — Care Management Important Message (Signed)
Important Message  Patient Details  Name: Erik [name] MRN: [id number] Date of Birth: 06/30/1956   Medicare Important Message Given:  Yes Signed IM notice given    Eber HongGreene, Tipton Ballow R, RN 08/14/2017, 1:58 PM

## 2017-08-14 NOTE — Progress Notes (Signed)
CCMD called to report patients heart rate in the 180s. Went to check on patient, and patient was having a coughing spell. He stated he couldn't cough up enough of the flem. MD notified, new orders received. Patient calmer now with heart rate back down in the 120s. Will continue to monitor patient.

## 2017-08-14 NOTE — H&P (Signed)
[facility] -  at [facility]   PATIENT NAME: Erik [name]    MR#:  [id number]  DATE OF BIRTH:  10/18/1955  DATE OF ADMISSION:  08/13/2017  PRIMARY CARE PHYSICIAN: Lyndon CodeKhan, Fozia M, MD   REQUESTING/REFERRING PHYSICIAN:   CHIEF COMPLAINT:   Chief Complaint  Patient presents with  . Respiratory Distress    HISTORY OF PRESENT ILLNESS: Erik [name]  is a 62 y.o. male with a known history of CHF and COPD on continuous 2 L per nasal cannula at home.  Patient was brought to emergency room for acute onset of severe shortness of breath started in the past 2-3 days gradually getting worse.  Patient complains of dry cough and wheezing x 3 days,  but no chest pain or fever or chills.  He does recall sick contacts some of the family members, who were recently visiting. At the arrival to emergency room oxygen saturation was in the 80s and heart rate was in 190s.  EKG showed atrial fibrillation with rapid ventricular response.  Chest x-ray shows acute bronchitis changes but no infiltrates.  He was initially placed on BiPAP, but he improved and he is now able to tolerate nasal cannula.  Heart rate improved as well and low 100s after metoprolol IV.   PAST MEDICAL HISTORY:   Past Medical History:  Diagnosis Date  . Arthritis   . Asthma   . Atrial fibrillation (HCC)   . CHF (congestive heart failure) (HCC)   . COPD (chronic obstructive pulmonary disease) (HCC)   . Coronary artery disease   . GERD (gastroesophageal reflux disease)   . Hypertension   . Myocardial infarction (HCC)   . Shortness of breath dyspnea     PAST SURGICAL HISTORY:  Past Surgical History:  Procedure Laterality Date  . CARDIAC CATHETERIZATION N/A 03/18/2015   Procedure: Left Heart Cath with Coronary Angiography;  Surgeon: Lamar BlinksBruce J Kowalski, MD;  Location: [facility] INVASIVE CV LAB;  Service: Cardiovascular;  Laterality: N/A;    SOCIAL HISTORY:  Social History   Tobacco Use  . Smoking status:  Former Smoker    Years: 25.00    Last attempt to quit: 03/20/1997    Years since quitting: 20.4  . Smokeless tobacco: Never Used  Substance Use Topics  . Alcohol use: No    FAMILY HISTORY: History reviewed. No pertinent family history.  DRUG ALLERGIES:  Allergies  Allergen Reactions  . Benadryl [Diphenhydramine Hcl] Shortness Of Breath  . Morphine And Related     REVIEW OF SYSTEMS:   CONSTITUTIONAL: No fever. He c/o fatigue or weakness.  EYES: No blurred or double vision.  EARS, NOSE, AND THROAT: No tinnitus or ear pain.  RESPIRATORY: Pt c/o cough, shortness of breath and  wheezing; no hemoptysis.  CARDIOVASCULAR: No chest pain, orthopnea, edema.  GASTROINTESTINAL: No nausea, vomiting, diarrhea or abdominal pain.  GENITOURINARY: No dysuria, hematuria.  ENDOCRINE: No polyuria, nocturia,  HEMATOLOGY: No anemia, easy bruising or bleeding SKIN: No rash or lesion. MUSCULOSKELETAL: History of osteoarthritis.   NEUROLOGIC: No focal weakness.  PSYCHIATRY: No anxiety or depression.   MEDICATIONS AT HOME:  Prior to Admission medications   Medication Sig Start Date End Date Taking? Authorizing Provider  albuterol-ipratropium (COMBIVENT) 18-103 MCG/ACT inhaler Inhale 2 puffs into the lungs every 4 (four) hours.   Yes [provider]  aspirin 325 MG tablet Take 325 mg by mouth daily.   Yes [provider]  diltiazem (CARTIA XT) 120 MG 24 hr capsule TAKE ONE  CAPSULE BY MOUTH EVERY DAY 04/14/17  Yes [provider]  furosemide (LASIX) 20 MG tablet Take 20 mg by mouth daily.   Yes [provider]  isosorbide mononitrate (IMDUR) 30 MG 24 hr tablet Take 1 tablet by mouth daily. 03/08/17  Yes [provider]  mometasone-formoterol (DULERA) 200-5 MCG/ACT AERO Inhale 2 Inhalers into the lungs 2 (two) times daily.   Yes [provider]  diltiazem (CARDIZEM) 120 MG tablet Take 120 mg by mouth daily.    [provider]  loratadine  (CLARITIN) 10 MG tablet Take 10 mg by mouth daily.    [provider]      PHYSICAL EXAMINATION:   VITAL SIGNS: Blood pressure (!) 142/90, pulse (!) 119, temperature 98.9 F (37.2 C), resp. rate (!) 21, height 5\' 10"  (1.778 m), weight 113.7 kg (250 lb 11.2 oz), SpO2 90 %.  GENERAL:  62 y.o.-year-old patient lying in the bed, at 45 degrees in mild acute respiratory distress.   EYES: Pupils equal, round, reactive to light and accommodation. No scleral icterus. Extraocular muscles intact.  HEENT: Head atraumatic, normocephalic. Oropharynx and nasopharynx clear.  NECK:  Supple, no jugular venous distention. No thyroid enlargement, no tenderness.  LUNGS: Reduced breath sounds and wheezing noted bilaterally.  Currently, no use of accessory muscles of respiration.  CARDIOVASCULAR: Heart rate 109, irregularly irregular. No S3/S4.  ABDOMEN: Soft, nontender, nondistended. Bowel sounds present. No organomegaly or mass.  EXTREMITIES: No pedal edema, cyanosis, or clubbing.  NEUROLOGIC: No focal weakness. Gait not checked.  PSYCHIATRIC: The patient is alert and oriented x 3.  SKIN: No obvious rash, lesion, or ulcer.   LABORATORY PANEL:   CBC Recent Labs  Lab 08/13/17 1940  WBC 6.9  HGB 13.5  HCT 40.5  PLT 220  MCV 91.3  MCH 30.3  MCHC 33.2  RDW 14.3  LYMPHSABS 1.8  MONOABS 1.5*  EOSABS 0.1  BASOSABS 0.0   ------------------------------------------------------------------------------------------------------------------  Chemistries  Recent Labs  Lab 08/13/17 1940  NA 137  K 4.5  CL 103  CO2 24  GLUCOSE 119*  BUN 14  CREATININE 0.87  CALCIUM 8.8*   ------------------------------------------------------------------------------------------------------------------ estimated creatinine clearance is 109.8 mL/min (by C-G formula based on SCr of 0.87  mg/dL). ------------------------------------------------------------------------------------------------------------------ No results for input(s): TSH, T4TOTAL, T3FREE, THYROIDAB in the last 72 hours.  Invalid input(s): FREET3   Coagulation profile No results for input(s): INR, PROTIME in the last 168 hours. ------------------------------------------------------------------------------------------------------------------- No results for input(s): DDIMER in the last 72 hours. -------------------------------------------------------------------------------------------------------------------  Cardiac Enzymes Recent Labs  Lab 08/13/17 1940  TROPONINI <0.03   ------------------------------------------------------------------------------------------------------------------ Invalid input(s): POCBNP  ---------------------------------------------------------------------------------------------------------------  Urinalysis    Component Value Date/Time   COLORURINE YELLOW (A) 08/13/2017 2310   APPEARANCEUR CLEAR (A) 08/13/2017 2310   APPEARANCEUR Clear 07/18/2013 1730   LABSPEC 1.014 08/13/2017 2310   LABSPEC 1.013 07/18/2013 1730   PHURINE 5.0 08/13/2017 2310   GLUCOSEU NEGATIVE 08/13/2017 2310   GLUCOSEU Negative 07/18/2013 1730   HGBUR SMALL (A) 08/13/2017 2310   BILIRUBINUR NEGATIVE 08/13/2017 2310   BILIRUBINUR Negative 07/18/2013 1730   KETONESUR NEGATIVE 08/13/2017 2310   PROTEINUR 30 (A) 08/13/2017 2310   NITRITE NEGATIVE 08/13/2017 2310   LEUKOCYTESUR NEGATIVE 08/13/2017 2310   LEUKOCYTESUR Negative 07/18/2013 1730     RADIOLOGY: Dg Chest Portable 1 View  Result Date: 08/13/2017 CLINICAL DATA:  EMS pt from home in respiratory distress; pt reports shortness of breath over the last few days that has worsened significantly in the last hour  or 2; pt restless; History of COPD; unable to talk in full sentences EXAM: PORTABLE CHEST 1 VIEW COMPARISON:  03/06/2016 FINDINGS:  Cardiopericardial silhouette appears enlarged, also accentuated by the technique. The lungs are clear. No pulmonary edema. IMPRESSION: Enlarged cardiac silhouette. Consider cardiomegaly or pericardial effusion. No pulmonary edema . Electronically Signed   By: Norva Pavlov M.D.   On: 08/13/2017 22:03    EKG: Orders placed or performed during the hospital encounter of 08/13/17  . ED EKG  . ED EKG  . EKG 12-Lead  . EKG 12-Lead    IMPRESSION AND PLAN:  1.  Acute hypoxemic respiratory failure secondary to COPD exacerbation and acute bronchitis.  Will start oxygen treatment, antibiotics, mednebs and steroids. 2.  Acute COPD exacerbation, see treatment above. 3.  Acute bronchitis, treatment as above under #1. 4.  Atrial fibrillation with RVR.  Rate improving status post metoprolol IV.  Restart home medication, Cardizem.  We will check 2D echo.  Patient requires long-term anticoagulation.  All the records are reviewed and case discussed with ED provider. Management plans discussed with the patient, family and they are in agreement.  CODE STATUS:    Code Status Orders  (From admission, onward)        Start     Ordered   08/14/17 0127  Full code  Continuous     08/14/17 0126    Code Status History    Date Active Date Inactive Code Status Order ID Comments User Context   This patient has a current code status but no historical code status.       TOTAL TIME TAKING CARE OF THIS PATIENT: 40 minutes.    Cammy Copa M.D on 08/14/2017 at 2:52 AM  Between 7am to 6pm - Pager - [phone]  After 6pm go to [email or web address] - password [email or web address] [facility]  [place] [facility]  Office  [phone]  CC: Primary care physician; Lyndon Code, MD

## 2017-08-14 NOTE — Progress Notes (Signed)
Pt arrived via stretcher from ED. Pt A&O with tachypnea on O2@ 3 liters. Telemetry monitor applied and called to CCMD.  Oriented to room, call bell, ascom. Booklet given

## 2017-08-14 NOTE — Plan of Care (Signed)
O2 @3  liters, 2liters chronic at home

## 2017-08-14 NOTE — Progress Notes (Signed)
MD Allena KatzPatel made aware of pt's HR in the 110-130's nonsustaining. An order for Lopressor IV Every 4 hours for HR>115 was obtained. Will administer and continue to monitor.

## 2017-08-14 NOTE — Progress Notes (Signed)
Admitted this morning for COPD flare.  Has been having some cough, shortness of breath started on IV steroids, bronchodilators.  Patient states that his phlegm is not breaking and wants some mucolytic.  Admission medications, lab data reviewed.  Vitals; most recent blood pressure 135/86, heart rate 82, saturation 93% on 2 L, temperature 97.30F. Physical examination: Alert, awake, oriented. Cardiovascular: S1, S2 regular. Lungs:  faint expiratory wheeze bilaterally. Abdomen: Soft, nontender, nondistended bowel sounds present.  1.  COPD exacerbation: Continue bronchodilators, IV steroids, antibiotics. 2.  Chronic respiratory failure: Patient has 2 L of oxygen all the time he will continue that. Sinus tachycardia due to COPD flare.  Continue Cardizem, metoprolol. 4.  History of coronary artery disease follows up with Dr. Gwen PoundsKowalski.  Plan continue Imdur, beta-blockers, statins, 5.  Chronic atrial fibrillation: On aspirin 325 mg p.o. daily, Cartia 120 mg p.o. daily, metoprolol 25 mg is ordered here.  requested Mucinex, ordered Mucinex to loosen the phlegm. Time spent 25 min

## 2017-08-14 NOTE — ED Notes (Signed)
Report called to michelle rn floor nurse 

## 2017-08-14 NOTE — Progress Notes (Signed)
*  PRELIMINARY RESULTS* Echocardiogram 2D Echocardiogram has been performed.  Cristela BlueHege, Marigene Erler 08/14/2017, 2:13 PM

## 2017-08-15 LAB — HIV ANTIBODY (ROUTINE TESTING W REFLEX): HIV Screen 4th Generation wRfx: NONREACTIVE

## 2017-08-15 LAB — ECHOCARDIOGRAM COMPLETE
HEIGHTINCHES: 70 in
WEIGHTICAEL: 4011.2 [oz_av]

## 2017-08-15 LAB — GLUCOSE, CAPILLARY: Glucose-Capillary: 127 mg/dL — ABNORMAL HIGH (ref 65–99)

## 2017-08-15 LAB — MAGNESIUM: Magnesium: 2 mg/dL (ref 1.7–2.4)

## 2017-08-15 MED ORDER — DILTIAZEM HCL ER COATED BEADS 180 MG PO CP24
180.0000 mg | ORAL_CAPSULE | Freq: Every day | ORAL | Status: DC
  Administered 2017-08-16 – 2017-08-19 (×4): 180 mg via ORAL
  Filled 2017-08-15 (×4): qty 1

## 2017-08-15 MED ORDER — PSEUDOEPHEDRINE HCL ER 120 MG PO TB12
120.0000 mg | ORAL_TABLET | Freq: Two times a day (BID) | ORAL | Status: DC
  Administered 2017-08-15 – 2017-08-19 (×9): 120 mg via ORAL
  Filled 2017-08-15 (×10): qty 1

## 2017-08-15 MED ORDER — ACETYLCYSTEINE 20 % IN SOLN
2.0000 mL | Freq: Three times a day (TID) | RESPIRATORY_TRACT | Status: DC
  Administered 2017-08-15 – 2017-08-16 (×3): 2 mL via RESPIRATORY_TRACT
  Administered 2017-08-16: 08:00:00 via RESPIRATORY_TRACT
  Administered 2017-08-17: 2 mL via RESPIRATORY_TRACT
  Filled 2017-08-15 (×8): qty 4

## 2017-08-15 NOTE — Progress Notes (Signed)
MD made aware that patient only takes 15mg  of Isosorbide Mononitrate (IMDUR) at home. Here he has been scheduled 30mg , but patient only wants to take the 15mg . MD verbalized that was ok. Will continue to monitor patient.

## 2017-08-15 NOTE — Progress Notes (Signed)
Patient is refusing bed alarm despite education on the importance of having it on. Patient states he is not going to get up without calling first. Bed in lowest position with phone and call bell within reach. Will continue to educate and monitor patient.

## 2017-08-15 NOTE — Care Management Note (Signed)
Case Management Note  Patient Details  Name: Erik [name] MRN: [id number] Date of Birth: 11/26/1955  Subjective/Objective:                 patient admitted from home.  Has chronic oxygen through [employer].  PCP- Beverely RisenFozia Khan and pulmonologist is Freda MunroSaadat Khan in the same office. currently is not receiving any services in the home.  Denies any issues with obtaining medications or with transportation.  Currently is not receiving any services in the home.  Does not require any assistive device for ambulation   Action/Plan:   Expected Discharge Date:                  Expected Discharge Plan:     In-House Referral:     Discharge planning Services     Post Acute Care Choice:    Choice offered to:     DME Arranged:    DME Agency:     HH Arranged:    HH Agency:     Status of Service:     If discussed at [employer] Meetings, dates discussed:    Additional Comments:  Eber HongGreene, Janiylah Hannis R, RN 08/15/2017, 2:11 PM

## 2017-08-15 NOTE — Progress Notes (Signed)
[facility] Physicians - [place] at [facility]   PATIENT NAME: Erik [name]    MR#:  [id number]  DATE OF BIRTH:  10/27/1955  SUBJECTIVE:  He  is admitted for COPD exacerbation yesterday.  Today he complains of sinus pressure, not able to get the phlegm out.  CHIEF COMPLAINT:   Chief Complaint  Patient presents with  . Respiratory Distress    REVIEW OF SYSTEMS:   ROS CONSTITUTIONAL: No fever, fatigue or weakness.  EYES: No blurred or double vision.  EARS, NOSE, AND THROAT: No tinnitus or ear pain.  RESPIRATORY: shortness of breath, mild cough.[name]  CARDIOVASCULAR: No chest pain, orthopnea, edema.  GASTROINTESTINAL: No nausea, vomiting, diarrhea or abdominal pain.  GENITOURINARY: No dysuria, hematuria.  ENDOCRINE: No polyuria, nocturia,  HEMATOLOGY: No anemia, easy bruising or bleeding SKIN: No rash or lesion. MUSCULOSKELETAL: No joint pain or arthritis.   NEUROLOGIC: No tingling, numbness, weakness.  PSYCHIATRY: No anxiety or depression.   DRUG ALLERGIES:   Allergies  Allergen Reactions  . Benadryl [Diphenhydramine Hcl] Shortness Of Breath  . Morphine And Related     VITALS:  Blood pressure (!) 136/105, pulse (!) 116, temperature 98.3 F (36.8 C), resp. rate 18, height 5\' 10"  (1.778 m), weight 112.8 kg (248 lb 11.2 oz), SpO2 97 %.  PHYSICAL EXAMINATION:  GENERAL:  62 y.o.-year-old patient lying in the bed with no acute distress.  c/ of stuffiness in the nose and also , sinus pressure. EYES: Pupils equal, round, reactive to light and accommodation. No scleral icterus. Extraocular muscles intact.  HEENT: Head atraumatic, normocephalic. Oropharynx and nasopharynx clear.  NECK:  Supple, no jugular venous distention. No thyroid enlargement, no tenderness.  LUNGS: Breath sounds bilaterally, no wheezing or rales.[name]  CARDIOVASCULAR: S1, S2 tachycardic, no murmurs, rubs, or gallops.  ABDOMEN: Soft, nontender, nondistended. Bowel sounds present. No organomegaly or  mass.  EXTREMITIES: No pedal edema, cyanosis, or clubbing.  NEUROLOGIC: Cranial nerves II through XII are intact. Muscle strength 5/5 in all extremities. Sensation intact. Gait not checked.  PSYCHIATRIC: The patient is alert and oriented x 3.  SKIN: No obvious rash, lesion, or ulcer.    LABORATORY PANEL:   CBC Recent Labs  Lab 08/14/17 0527  WBC 8.3  HGB 16.4  HCT 50.3  PLT 329   ------------------------------------------------------------------------------------------------------------------  Chemistries  Recent Labs  Lab 08/14/17 0527 08/15/17 0411  NA 137  --   K 4.1  --   CL 100*  --   CO2 25  --   GLUCOSE 170*  --   BUN 13  --   CREATININE 0.76  --   CALCIUM 9.1  --   MG  --  2.0   ------------------------------------------------------------------------------------------------------------------  Cardiac Enzymes Recent Labs  Lab 08/13/17 1940  TROPONINI <0.03   ------------------------------------------------------------------------------------------------------------------  RADIOLOGY:  Dg Chest Portable 1 View  Result Date: 08/13/2017 CLINICAL DATA:  EMS pt from home in respiratory distress; pt reports shortness of breath over the last few days that has worsened significantly in the last hour or 2; pt restless; History of COPD; unable to talk in full sentences EXAM: PORTABLE CHEST 1 VIEW COMPARISON:  03/06/2016 FINDINGS: Cardiopericardial silhouette appears enlarged, also accentuated by the technique. The lungs are clear. No pulmonary edema. IMPRESSION: Enlarged cardiac silhouette. Consider cardiomegaly or pericardial effusion. No pulmonary edema . Electronically Signed   By: Norva Pavlov M.D.   On: 08/13/2017 22:03    EKG:   Orders placed or performed during the hospital encounter of 08/13/17  .  ED EKG  . ED EKG  . EKG 12-Lead  . EKG 12-Lead    ASSESSMENT AND PLAN:   COPD exacerbation.  Clinically slightly better than yesterday, continue  bronchodilators, IV steroids. 2.  Acute bronchitis: Continue IV antibiotics 3.  Sinus tachycardia secondary to COPD exacerbation, respiratory distress: Increase the dose of Cardizem to 180 mg daily. 4.  Chronic atrial fibrillation: Patient is on aspirin 320 mg p.o. daily, continue beta-blockers, calcium channel blockers, Patient follows up with Dr. Gwen PoundsKowalski.  Patient echocardiogram showed EF 60-65%. #5. history of CAD: Patient takes only 15 mg of Imdur not 30 mg.  Change at the dose of Imdur.    All the records are reviewed and case discussed with Care Management/Social Workerr. Management plans discussed with the patient, family and they are in agreement.  CODE STATUS: full code TOTAL TIME TAKING CARE OF THIS PATIENT: 35minutes.   POSSIBLE D/C IN 1-2 DAYS, DEPENDING ON CLINICAL CONDITION.   Katha HammingSnehalatha Kyrra Prada M.D on 08/15/2017 at 12:53 PM  Between 7am to 6pm - Pager - [phone]  After 6pm go to [email or web address] - password [email or web address] [facility]  [facility]  Office  [phone]  CC: Primary care physician; Lyndon CodeKhan, Fozia M, MD   Note: This dictation was prepared with Dragon dictation along with smaller phrase technology. Any transcriptional errors that result from this process are unintentional.

## 2017-08-15 NOTE — Plan of Care (Signed)
  Progressing Education: Knowledge of General Education information will improve 08/15/2017 1748 - Progressing by Erma HeritageAlejo Calderon, Ziona Wickens, RN Health Behavior/Discharge Planning: Ability to manage health-related needs will improve 08/15/2017 1748 - Progressing by Weldon PickingAlejo Calderon, Manuella GhaziBerenice, RN Clinical Measurements: Ability to maintain clinical measurements within normal limits will improve 08/15/2017 1748 - Progressing by Erma HeritageAlejo Calderon, Nyjai Graff, RN Respiratory complications will improve 08/15/2017 1748 - Progressing by Weldon PickingAlejo Calderon, Manuella GhaziBerenice, RN Activity: Risk for activity intolerance will decrease 08/15/2017 1748 - Progressing by Weldon PickingAlejo Calderon, Manuella GhaziBerenice, RN Nutrition: Adequate nutrition will be maintained 08/15/2017 1748 - Progressing by Weldon PickingAlejo Calderon, Manuella GhaziBerenice, RN Coping: Level of anxiety will decrease 08/15/2017 1748 - Progressing by Weldon PickingAlejo Calderon, Manuella GhaziBerenice, RN Elimination: Will not experience complications related to bowel motility 08/15/2017 1748 - Progressing by Weldon PickingAlejo Calderon, Manuella GhaziBerenice, RN Will not experience complications related to urinary retention 08/15/2017 1748 - Progressing by Erma HeritageAlejo Calderon, Arnette Driggs, RN

## 2017-08-15 NOTE — Progress Notes (Signed)
Spoke to MD Caryn BeeMaier to make her aware about pt's HR and increased  in SOB. Verbal orders received for PO lopressor 25 mg BID and IV lasix 40 mg once. Will continue to monitor.

## 2017-08-16 LAB — GLUCOSE, CAPILLARY: GLUCOSE-CAPILLARY: 128 mg/dL — AB (ref 65–99)

## 2017-08-16 MED ORDER — NYSTATIN 100000 UNIT/ML MT SUSP
5.0000 mL | Freq: Four times a day (QID) | OROMUCOSAL | Status: DC
  Administered 2017-08-16 – 2017-08-17 (×4): 500000 [IU] via ORAL
  Filled 2017-08-16 (×6): qty 5

## 2017-08-16 NOTE — Plan of Care (Signed)
  Progressing Education: Knowledge of General Education information will improve 08/16/2017 2327 - Progressing by Dorna LeitzNesbitt, Kadience Macchi M, RN Education: Knowledge of disease or condition will improve 08/16/2017 2327 - Progressing by Dorna LeitzNesbitt, Verl Whitmore M, RN Activity: Ability to tolerate increased activity will improve 08/16/2017 2327 - Progressing by Dorna LeitzNesbitt, Paloma Grange M, RN Respiratory: Levels of oxygenation will improve 08/16/2017 2327 - Progressing by Dorna LeitzNesbitt, Lakecia Deschamps M, RN

## 2017-08-16 NOTE — Progress Notes (Addendum)
[facility] Physicians - [place] at [facility]   PATIENT NAME: Erik [name]    MR#:  [id number]  DATE OF BIRTH:  Jun 22, 1956  SUBJECTIVE:  He  is admitted for COPD exacerbation yesterday.  Feeling  better than yesterday.  Does not want Mucomyst nebulizer.  Complains of sore throat.  CHIEF COMPLAINT:   Chief Complaint  Patient presents with  . Respiratory Distress    REVIEW OF SYSTEMS:   ROS CONSTITUTIONAL: No fever, fatigue or weakness.  EYES: No blurred or double vision.  EARS, NOSE, AND THROAT: No tinnitus or ear pain.  RESPIRATORY: shortness of breath, mild cough.[name]  CARDIOVASCULAR: No chest pain, orthopnea, edema.  GASTROINTESTINAL: No nausea, vomiting, diarrhea or abdominal pain.  GENITOURINARY: No dysuria, hematuria.  ENDOCRINE: No polyuria, nocturia,  HEMATOLOGY: No anemia, easy bruising or bleeding SKIN: No rash or lesion. MUSCULOSKELETAL: No joint pain or arthritis.   NEUROLOGIC: No tingling, numbness, weakness.  PSYCHIATRY: No anxiety or depression.   DRUG ALLERGIES:   Allergies  Allergen Reactions  . Benadryl [Diphenhydramine Hcl] Shortness Of Breath  . Morphine And Related     VITALS:  Blood pressure 132/88, pulse 99, temperature (!) 97.3 F (36.3 C), temperature source Oral, resp. rate 20, height 5\' 10"  (1.778 m), weight 113.2 kg (249 lb 9.6 oz), SpO2 93 %.  PHYSICAL EXAMINATION:  GENERAL:  62 y.o.-year-old patient lying in the bed with no acute distress.  c/ of stuffiness in the nose and also , sinus pressure. EYES: Pupils equal, round, reactive to light and accommodation. No scleral icterus. Extraocular muscles intact.  HEENT: Head atraumatic, normocephalic. Oropharynx and nasopharynx clear.  NECK:  Supple, no jugular venous distention. No thyroid enlargement, no tenderness.  LUNGS: Diminished breath sounds bilaterally. CARDIOVASCULAR: S1, S2 tachycardic, no murmurs, rubs, or gallops.  ABDOMEN: Soft, nontender, nondistended. Bowel sounds  present. No organomegaly or mass.  EXTREMITIES: No pedal edema, cyanosis, or clubbing.  NEUROLOGIC: Cranial nerves II through XII are intact. Muscle strength 5/5 in all extremities. Sensation intact. Gait not checked.  PSYCHIATRIC: The patient is alert and oriented x 3.  SKIN: No obvious rash, lesion, or ulcer.    LABORATORY PANEL:   CBC Recent Labs  Lab 08/14/17 0527  WBC 8.3  HGB 16.4  HCT 50.3  PLT 329   ------------------------------------------------------------------------------------------------------------------  Chemistries  Recent Labs  Lab 08/14/17 0527 08/15/17 0411  NA 137  --   K 4.1  --   CL 100*  --   CO2 25  --   GLUCOSE 170*  --   BUN 13  --   CREATININE 0.76  --   CALCIUM 9.1  --   MG  --  2.0   ------------------------------------------------------------------------------------------------------------------  Cardiac Enzymes Recent Labs  Lab 08/13/17 1940  TROPONINI <0.03   ------------------------------------------------------------------------------------------------------------------  RADIOLOGY:  No results found.  EKG:   Orders placed or performed during the hospital encounter of 08/13/17  . ED EKG  . ED EKG  . EKG 12-Lead  . EKG 12-Lead    ASSESSMENT AND PLAN:   COPD exacerbation.  Clinically slightly better than yesterday, continue bronchodilators, IV steroids.  Discontinue Mucomyst nebulizer because he feels sore throat r.  Continue Mucomyst Mucinex, add nystatin  2.  Acute bronchitis: Continue IV antibiotics 3.  Sinus tachycardia secondary to COPD exacerbation, respiratory distress: Increase the dose of Cardizem to 180 mg daily. 4.  Chronic atrial fibrillation: Patient is on aspirin 320 mg p.o. daily, continue beta-blockers, calcium channel blockers, Patient follows up  with Dr. Gwen PoundsKowalski.  Patient echocardiogram showed EF 60-65%. #5. history of CAD: Patient takes only 15 mg of Imdur not 30 mg.  Change at the dose of  Imdur. Likely discharge  home tomorrow.   All the records are reviewed and case discussed with Care Management/Social Workerr. Management plans discussed with the patient, family and they are in agreement.  CODE STATUS: full code TOTAL TIME TAKING CARE OF THIS PATIENT: 35minutes.   POSSIBLE D/C IN 1-2 DAYS, DEPENDING ON CLINICAL CONDITION.   Katha HammingSnehalatha Galena Logie M.D on 08/16/2017 at 11:21 AM  Between 7am to 6pm - Pager - [phone]  After 6pm go to [email or web address] - password [email or web address] [email or web address]  [facility] [place] [facility]  Office  [phone]  CC: Primary care physician; Lyndon CodeKhan, Fozia M, MD   Note: This dictation was prepared with Dragon dictation along with smaller phrase technology. Any transcriptional errors that result from this process are unintentional.

## 2017-08-17 LAB — GLUCOSE, CAPILLARY: GLUCOSE-CAPILLARY: 188 mg/dL — AB (ref 65–99)

## 2017-08-17 MED ORDER — AZITHROMYCIN 250 MG PO TABS
500.0000 mg | ORAL_TABLET | Freq: Every day | ORAL | Status: DC
  Administered 2017-08-18: 500 mg via ORAL
  Filled 2017-08-17: qty 2

## 2017-08-17 MED ORDER — FUROSEMIDE 10 MG/ML IJ SOLN
40.0000 mg | Freq: Once | INTRAMUSCULAR | Status: AC
  Administered 2017-08-17: 40 mg via INTRAVENOUS
  Filled 2017-08-17: qty 4

## 2017-08-17 NOTE — Evaluation (Signed)
Physical Therapy Evaluation Patient Details Name: Erik [name] MRN: [id number] DOB: 31-Dec-1955 Today's Date: 08/17/2017   History of Present Illness  62 y.o. male with a known history of CHF and COPD on continuous 2 L per nasal cannula at home. Chronic L LE sciatica  Clinical Impression  Pt was able to ambulate ~150 ft with walker but fatigued quickly and ultimately needed O2 increased to 4 L (dropped as low as 83% on 2 liters).  Pt is able to negotiate up/down steps with step-to strategy but overall was safe apart from significant fatigue.  Apparently he uses a standard walker at home and picks it up each time, he did report feeling considerably better with the FWW. Discussed and instructed on hip stretch to initiate addressing sciatica, as pt's activity tolerance improves he may benefit from outpatient PT, but at this point he will require HHPT.    Follow Up Recommendations Home health PT    Equipment Recommendations  Rolling walker with 5" wheels    Recommendations for Other Services       Precautions / Restrictions Restrictions Weight Bearing Restrictions: No      Mobility  Bed Mobility Overal bed mobility: Independent                Transfers Overall transfer level: Independent Equipment used: Rolling walker (2 wheeled)             General transfer comment: Pt needed heavy b/l UE push to get to standing, but did not need assist.  Reliant on walker for balance  Ambulation/Gait Ambulation/Gait assistance: Min guard Ambulation Distance (Feet): 150 Feet Assistive device: Rolling walker (2 wheeled)       General Gait Details: Pt with forward flexed, slow, hesitant gait.  Relatively consistent cadence despite L LE sciatic pain.  Pt became very fatigued with the effort, 2 liters bumped to 4 liters as sats dropped from mid 90s to 83%, increased to ~90 on 4 liters.  Stairs Stairs: Yes Stairs assistance: Min guard Stair Management: Two rails;Step to  pattern Number of Stairs: 6 General stair comments: Pt again hesitant on steps, reliant on UEs, quick to fatigue though he was safe and relatively confident with performing steps.  Wheelchair Mobility    Modified Rankin (Stroke Patients Only)       Balance Overall balance assessment: Modified Independent                                           Pertinent Vitals/Pain      Home Living Family/patient expects to be discharged to:: Private residence Living Arrangements: Spouse/significant other;Other (Comment) Available Help at Discharge: Family Type of Home: House Home Access: Stairs to enter Entrance Stairs-Rails: Can reach both Entrance Stairs-Number of Steps: 6   Home Equipment: Walker - 4 wheels;Walker - standard      Prior Function Level of Independence: Independent with assistive device(s)         Comments: Pt and wife report that he is largely sedentary, reliant on AD     Hand Dominance        Extremity/Trunk Assessment   Upper Extremity Assessment Upper Extremity Assessment: Overall WFL for tasks assessed;Generalized weakness    Lower Extremity Assessment Lower Extremity Assessment: Overall WFL for tasks assessed;Generalized weakness(v. tight in b/l hips, unable to achieve tailor position)       Communication   Communication: No  difficulties  Cognition Arousal/Alertness: Awake/alert Behavior During Therapy: WFL for tasks assessed/performed Overall Cognitive Status: Within Functional Limits for tasks assessed                                        General Comments      Exercises Other Exercises Other Exercises: performed and instructed with hip abd stretches   Assessment/Plan    PT Assessment Patient needs continued PT services  PT Problem List         PT Treatment Interventions Gait training;Therapeutic exercise;Therapeutic activities;DME instruction;Stair training;Functional mobility training;Balance  training;Neuromuscular re-education;Patient/family education    PT Goals (Current goals can be found in the Care Plan section)  Acute Rehab PT Goals Patient Stated Goal: be more active, control L sciatic pain PT Goal Formulation: With patient Time For Goal Achievement: 08/31/17 Potential to Achieve Goals: Fair    Frequency Min 2X/week   Barriers to discharge        Co-evaluation               AM-PAC PT "6 Clicks" Daily Activity  Outcome Measure Difficulty turning over in bed (including adjusting bedclothes, sheets and blankets)?: None Difficulty moving from lying on back to sitting on the side of the bed? : None Difficulty sitting down on and standing up from a chair with arms (e.g., wheelchair, bedside commode, etc,.)?: None Help needed moving to and from a bed to chair (including a wheelchair)?: A Little Help needed walking in hospital room?: A Little Help needed climbing 3-5 steps with a railing? : A Little 6 Click Score: 21    End of Session Equipment Utilized During Treatment: Gait belt;Oxygen Activity Tolerance: Patient tolerated treatment well Patient left: in bed;with call bell/phone within reach;with family/visitor present Nurse Communication: Mobility status PT Visit Diagnosis: Muscle weakness (generalized) (M62.81);Difficulty in walking, not elsewhere classified (R26.2);Pain Pain - Right/Left: Left Pain - part of body: Leg    Time: 1330-1400 PT Time Calculation (min) (ACUTE ONLY): 30 min   Charges:   PT Evaluation $PT Eval Low Complexity: 1 Low     PT G CodesMalachi Pro, DPT 08/17/2017, 2:57 PM

## 2017-08-17 NOTE — Progress Notes (Signed)
[facility] Physicians - [place] at [facility]   PATIENT NAME: Erik [name]    MR#:  [id number]  DATE OF BIRTH:  1955-09-07  SUBJECTIVE: Patient feels gurgling in the chest,.no chest pain.  Getting the phlegm out.  Left leg sciatica, requesting physical therapy..  CHIEF COMPLAINT:   Chief Complaint  Patient presents with  . Respiratory Distress    REVIEW OF SYSTEMS:   ROS CONSTITUTIONAL: No fever, fatigue or weakness.  EYES: No blurred or double vision.  EARS, NOSE, AND THROAT: No tinnitus or ear pain.  RESPIRATORY: shortness of breath, mild cough.[name]  CARDIOVASCULAR: No chest pain, orthopnea, edema.  GASTROINTESTINAL: No nausea, vomiting, diarrhea or abdominal pain.  GENITOURINARY: No dysuria, hematuria.  ENDOCRINE: No polyuria, nocturia,  HEMATOLOGY: No anemia, easy bruising or bleeding SKIN: No rash or lesion. MUSCULOSKELETAL: No joint pain or arthritis.   NEUROLOGIC: No tingling, numbness, weakness.  PSYCHIATRY: No anxiety or depression.   DRUG ALLERGIES:   Allergies  Allergen Reactions  . Benadryl [Diphenhydramine Hcl] Shortness Of Breath  . Morphine And Related     VITALS:  Blood pressure 136/64, pulse 98, temperature (!) 97.3 F (36.3 C), temperature source Oral, resp. rate 18, height 5\' 10"  (1.778 m), weight 113.8 kg (250 lb 12.8 oz), SpO2 98 %.  PHYSICAL EXAMINATION:  GENERAL:  62 y.o.-year-old patient lying in the bed with no acute distress.  c/ of stuffiness in the nose and also , sinus pressure. EYES: Pupils equal, round, reactive to light and accommodation. No scleral icterus. Extraocular muscles intact.  HEENT: Head atraumatic, normocephalic. Oropharynx and nasopharynx clear.  NECK:  Supple, no jugular venous distention. No thyroid enlargement, no tenderness.  LUNGS: Expiratory wheezes bilaterally and lower zones, CARDIOVASCULAR: S1, S2 tachycardic, no murmurs, rubs, or gallops.  ABDOMEN: Soft, nontender, nondistended. Bowel sounds present.  No organomegaly or mass.  EXTREMITIES: No pedal edema, cyanosis, or clubbing.  NEUROLOGIC: Cranial nerves II through XII are intact. Muscle strength 5/5 in all extremities. Sensation intact. Gait not checked.  PSYCHIATRIC: The patient is alert and oriented x 3.  SKIN: No obvious rash, lesion, or ulcer.    LABORATORY PANEL:   CBC Recent Labs  Lab 08/14/17 0527  WBC 8.3  HGB 16.4  HCT 50.3  PLT 329   ------------------------------------------------------------------------------------------------------------------  Chemistries  Recent Labs  Lab 08/14/17 0527 08/15/17 0411  NA 137  --   K 4.1  --   CL 100*  --   CO2 25  --   GLUCOSE 170*  --   BUN 13  --   CREATININE 0.76  --   CALCIUM 9.1  --   MG  --  2.0   ------------------------------------------------------------------------------------------------------------------  Cardiac Enzymes Recent Labs  Lab 08/13/17 1940  TROPONINI <0.03   ------------------------------------------------------------------------------------------------------------------  RADIOLOGY:  No results found.  EKG:   Orders placed or performed during the hospital encounter of 08/13/17  . ED EKG  . ED EKG  . EKG 12-Lead  . EKG 12-Lead    ASSESSMENT AND PLAN:   COPD exacerbation.  slowly improving,, continue bronchodilators, IV steroids.  Discontinue Mucomyst nebulizer because he feels sore throat r.  Continue Mucomyst Mucinex, add nystatin add small dose Lasix to help with gurgling in chest.    2.  Acute bronchitis: Continue IV antibiotics   3.  Sinus tachycardia secondary to COPD exacerbation, respiratory distress: Increased the dose of Cardizem to 180 mg daily.  Now heart rate better. 4.  Chronic atrial fibrillation: Patient is on aspirin  320 mg p.o. daily, continue beta-blockers, calcium channel blockers, Patient follows up with Dr. Gwen PoundsKowalski.  Patient echocardiogram showed EF 60-65%.  And has right RCA stenosis , medical  management advised because of his severe COPD.   #5. history of CAD: Patient takes only 15 mg of Imdur not 30 mg.  Change at the dose of Imdur.  6. left leg sciatica, requesting physical therapy evaluation.   All the records are reviewed and case discussed with Care Management/Social Workerr. Management plans discussed with the patient, family and they are in agreement.  CODE STATUS: full code TOTAL TIME TAKING CARE OF THIS PATIENT: 35minutes.   POSSIBLE D/C IN 1-2 DAYS, DEPENDING ON CLINICAL CONDITION.   Katha HammingSnehalatha Alia Parsley M.D on 08/17/2017 at 10:31 AM  Between 7am to 6pm - Pager - [phone]  After 6pm go to [email or web address] - password [email or web address] [facility]  [facility] [place] [facility]  Office  [phone]  CC: Primary care physician; Lyndon CodeKhan, Fozia M, MD   Note: This dictation was prepared with Dragon dictation along with smaller phrase technology. Any transcriptional errors that result from this process are unintentional.

## 2017-08-17 NOTE — Care Management (Addendum)
Physical therapy has recommended home health physical and a front wheeled rolling walker.  Patient has not had insurance pay for a walker "ever." He is agreeable to home health and no agency preference.  Referral to to and acceptd  by [facility] for PT RN and walker.  Confirmed contact information.  patient may benefit from [facility]/PNA Protocol.  Placed in paper chart for MD signaturee

## 2017-08-17 NOTE — Plan of Care (Addendum)
Pt is A&Ox4. VSS. 2L O2 [place] continued. Pt remains Afib on monitor. Family at bedside. OOB with standby assist and walker. No complaints thus far. Will continue to monitor and report to oncoming RN .  Progressing Education: Knowledge of General Education information will improve 08/17/2017 1708 - Progressing by Jodie EchevariaWhite, Schyler Counsell L, RN Health Behavior/Discharge Planning: Ability to manage health-related needs will improve 08/17/2017 1708 - Progressing by Jodie EchevariaWhite, Heran Campau L, RN Clinical Measurements: Ability to maintain clinical measurements within normal limits will improve 08/17/2017 1708 - Progressing by Jodie EchevariaWhite, Reagyn Facemire L, RN Will remain free from infection 08/17/2017 1708 - Progressing by Jodie EchevariaWhite, Kinsie Belford L, RN Diagnostic test results will improve 08/17/2017 1708 - Progressing by Jodie EchevariaWhite, Naithen Rivenburg L, RN Respiratory complications will improve 08/17/2017 1708 - Progressing by Jodie EchevariaWhite, Nitara Szczerba L, RN Cardiovascular complication will be avoided 08/17/2017 1708 - Progressing by Jodie EchevariaWhite, Illene Sweeting L, RN Activity: Risk for activity intolerance will decrease 08/17/2017 1708 - Progressing by Jodie EchevariaWhite, [name] Ericsson L, RN Nutrition: Adequate nutrition will be maintained 08/17/2017 1708 - Progressing by Jodie EchevariaWhite, Culley Hedeen L, RN Coping: Level of anxiety will decrease 08/17/2017 1708 - Progressing by Jodie EchevariaWhite, Tifini Reeder L, RN Elimination: Will not experience complications related to bowel motility 08/17/2017 1708 - Progressing by Jodie EchevariaWhite, Reeve Mallo L, RN Will not experience complications related to urinary retention 08/17/2017 1708 - Progressing by Jodie EchevariaWhite, Amyriah Buras L, RN Pain Managment: General experience of comfort will improve 08/17/2017 1708 - Progressing by Jodie EchevariaWhite, Walker Paddack L, RN Safety: Ability to remain free from injury will improve 08/17/2017 1708 - Progressing by Jodie EchevariaWhite, Brooklyn Alfredo L, RN Skin Integrity: Risk for impaired skin integrity will decrease 08/17/2017 1708 - Progressing by Jodie EchevariaWhite, Barrett Goldie L, RN Spiritual Needs Ability to function at adequate level 08/17/2017 1708 -  Progressing by Jodie EchevariaWhite, Daison Braxton L, RN Education: Knowledge of disease or condition will improve 08/17/2017 1708 - Progressing by Jodie EchevariaWhite, Louis Ivery L, RN Knowledge of the prescribed therapeutic regimen will improve 08/17/2017 1708 - Progressing by Jodie EchevariaWhite, Jordyn Doane L, RN Activity: Ability to tolerate increased activity will improve 08/17/2017 1708 - Progressing by Jodie EchevariaWhite, Liani Caris L, RN Respiratory: Ability to maintain a clear airway will improve 08/17/2017 1708 - Progressing by Jodie EchevariaWhite, Kellyn Mansfield L, RN Levels of oxygenation will improve 08/17/2017 1708 - Progressing by Jodie EchevariaWhite, Cung Masterson L, RN Ability to maintain adequate ventilation will improve 08/17/2017 1708 - Progressing by Jodie EchevariaWhite, Tawanna Funk L, RN

## 2017-08-18 LAB — CULTURE, BLOOD (ROUTINE X 2)
CULTURE: NO GROWTH
Culture: NO GROWTH
SPECIAL REQUESTS: ADEQUATE
Special Requests: ADEQUATE

## 2017-08-18 LAB — GLUCOSE, CAPILLARY
GLUCOSE-CAPILLARY: 151 mg/dL — AB (ref 65–99)
Glucose-Capillary: 133 mg/dL — ABNORMAL HIGH (ref 65–99)

## 2017-08-18 MED ORDER — METHYLPREDNISOLONE SODIUM SUCC 125 MG IJ SOLR: 60.0000 mg | Freq: Two times a day (BID) | INTRAMUSCULAR | Status: DC

## 2017-08-18 NOTE — Care Management (Addendum)
Attending has signed the [facility] Protocol.  Dan HumphreysWalker has been delivered to room and requested home health order  RN and PT . [facility] has referral

## 2017-08-18 NOTE — Care Management Important Message (Signed)
Important Message  Patient Details  Name: Erik [name] MRN: [id number] Date of Birth: Feb 26, 1956   Medicare Important Message Given:  Yes    Eber HongGreene, Enisa Runyan R, RN 08/18/2017, 3:03 PM

## 2017-08-19 DIAGNOSIS — Z23 Encounter for immunization: Secondary | ICD-10-CM | POA: Diagnosis not present

## 2017-08-19 LAB — CREATININE, SERUM
Creatinine, Ser: 0.71 mg/dL (ref 0.61–1.24)
GFR calc non Af Amer: >60 mL/min (ref >60)

## 2017-08-19 LAB — GLUCOSE, CAPILLARY: Glucose-Capillary: 157 mg/dL — ABNORMAL HIGH (ref 65–99)

## 2017-08-19 MED ORDER — PREDNISONE 10 MG (21) PO TBPK: ORAL_TABLET | ORAL | 0 refills | Status: DC

## 2017-08-19 MED ORDER — AZITHROMYCIN 250 MG PO TABS: ORAL_TABLET | ORAL | 0 refills | Status: AC

## 2017-08-19 MED ORDER — DILTIAZEM HCL ER COATED BEADS 180 MG PO CP24: 180.0000 mg | ORAL_CAPSULE | Freq: Every day | ORAL | 0 refills | Status: DC

## 2017-08-19 MED ORDER — IPRATROPIUM-ALBUTEROL 0.5-2.5 (3) MG/3ML IN SOLN: 3.0000 mL | Freq: Four times a day (QID) | RESPIRATORY_TRACT | 0 refills | Status: DC

## 2017-08-19 NOTE — Progress Notes (Signed)
Discharge home today, instructions are in the computer.  Discharge home with home health physical therapy, patient wanted nebulizer prescription.  Advised to continue incentive spirometry, enrolled in [facility] protocol.  A arrange for home health RN, physical therapy.  Patient requiring oxygen during ambulation increase the oxygen to 4 L because saturation dropped to 83% on 2 L while walking.  Patient had a lot of fatigue while walking.  Advised about pulmonary rehab that [place] can get arranged. History of hypertension, tachycardia, patient heart rate has been high so increased the dose of Cardizem.  And had history RCA stenosis followed by Dr. Gwen PoundsKowalski.  Continue aspirin, Imdur.  Because of his lung problem patient could not have stent.  Time spent 30 minutes.

## 2017-08-19 NOTE — Progress Notes (Signed)
IV and tele removed. Discharge instructions given to patient. No distress at this time. Wife at bedside and will transport patient home.

## 2017-08-19 NOTE — Care Management Note (Signed)
Case Management Note  Patient Details  Name: Erik [name] MRN: [id number] Date of Birth: 1956/03/10  Subjective/Objective:     [name] reports that he already has a RW, a nebulizer machine, and is on chronic 02 with [facility]. A referral for HH=PT, Aide, SW and Respiratory Therapist, was called to [name].                Action/Plan:   Expected Discharge Date:  08/19/17               Expected Discharge Plan:  Home w Home Health Services  In-House Referral:     Discharge planning Services  CM Consult  Post Acute Care Choice:  Home Health, Durable Medical Equipment Choice offered to:  Patient  DME Arranged:  Dan HumphreysWalker DME Agency:  [facility]  HH Arranged:  PT, Nurse's Aide, Social Work, Audiological scientistespirator Therapy HH Agency:  [facility]  Status of Service:  In process, will continue to follow  If discussed at Long Length of Stay Meetings, dates discussed:    Additional Comments:  [name] A, RN 08/19/2017, 11:09 AM

## 2017-08-22 NOTE — Discharge Summary (Signed)
GERMAN MANKE, is a 62 y.o. male  DOB 1956/01/06  MRN [id number].  Admission date:  08/13/2017  Admitting Physician  Cammy Copa, MD  Discharge Date:  08/19/2017   Primary MD  Lyndon Code, MD  Recommendations for primary care physician for things to follow:   Follow-up with PCP in 1 week Follow-up with primary pulmonologist Dr. Freda Munro In 1 week   Admission Diagnosis  Respiratory distress [R06.03] COPD exacerbation (HCC) [J44.1] Atrial fibrillation, unspecified type [facility]) [I48.91]   Discharge Diagnosis  Respiratory distress [R06.03] COPD exacerbation (HCC) [J44.1] Atrial fibrillation, unspecified type (HCC) [I48.91]   Active Problems:   COPD with hypoxia [facility])      Past Medical History:  Diagnosis Date  . Arthritis   . Asthma   . Atrial fibrillation (HCC)   . CHF (congestive heart failure) (HCC)   . COPD (chronic obstructive pulmonary disease) (HCC)   . Coronary artery disease   . GERD (gastroesophageal reflux disease)   . Hypertension   . Myocardial infarction (HCC)   . Shortness of breath dyspnea     Past Surgical History:  Procedure Laterality Date  . CARDIAC CATHETERIZATION N/A 03/18/2015   Procedure: Left Heart Cath with Coronary Angiography;  Surgeon: Lamar Blinks, MD;  Location: [facility] INVASIVE CV LAB;  Service: Cardiovascular;  Laterality: N/A;       History of present illness and  Hospital Course:     Kindly see H&P for history of present illness and admission details, please review complete Labs, Consult reports and Test reports for all details in brief  HPI  from the history and physical done on the day of admission Male patient with history of severe COPD of oxygen at home, CAD came in because of respiratory distress that is getting worse for the past 2-3 days associated with  wheezing, dry cough.  Patient also found to have atrial fibrillation with RVR with heart rate of 190 bpm.  So he is admitted to telemetry for COPD exacerbation and atrial fibrillation with RVR.   Hospital Course  #1.acute on chronic respiratory failure secondary to COPD exacerbation: Started on IV steroids, bronchodilators, antibiotics.  Symptoms slowly improved, patient chest x-ray did not show pneumonia.  Discharged home with tapering course of steroids, also advised to use 4 L of oxygen during ambulation because patient saturation dropped to 83% on 2 L of oxygen. #3 atrial fibrillation with RVR: Required as needed IV metoprolol.  Admitted to telemetry.  Increase CardizemCD from 120 mg to 180 mg. Advised the patient to use Cardizem CD instead of Cartia XT because both medicines are listed in the discharge medication.  We told  the patient only to continue Cardizem CD. Patient is on aspirin 325 mg p.o. daily #4.left sciatica, deconditioning: Seen by physical therapy, recommended home health physical therapy.  #5.  History of CAD with history of RCA stenosis, medical management advised by cardiologist Dr. Gwen Pounds because of his severe COPD.  Patient takes Imdur 15 mg p.o. daily.  Discharge Condition: Stable   Follow UP  Follow-up Information    Lyndon Code, MD Follow up in 1 week(s).   Specialty:  Internal Medicine Contact information: [address] [place] [address] [phone]        Yevonne Pax, MD Follow up in 2 week(s).   Specialties:  Internal Medicine, Pulmonary Disease Contact information: [address] [place] [address] [phone]  Discharge Instructions  and  Discharge Medications    Discharge Instructions    DME Nebulizer machine   Complete by:  As directed    Patient needs a nebulizer to treat with the following condition:  Shortness of breath   Face-to-face encounter (required for Medicare/Medicaid patients)   Complete by:   As directed    I Katha HammingSnehalatha Mikolaj Woolstenhulme certify that this patient is under my care and that I, or a nurse practitioner or physician's assistant working with me, had a face-to-face encounter that meets the physician face-to-face encounter requirements with this patient on 08/19/2017. The encounter with the patient was in whole, or in part for the following medical condition(s) which is the primary reason for home health care ( Coronary artery disease, COPD exacerbation, deconditioning, sciatica pain in the left leg.   The encounter with the patient was in whole, or in part, for the following medical condition, which is the primary reason for home health care:  Whole   I certify that, based on my findings, the following services are medically necessary home health services:   Nursing Physical therapy     Reason for Medically Necessary Home Health Services:  Therapy- Investment banker, operationalGait Training, Patent examinerTransfer Training and Stair Training   My clinical findings support the need for the above services:  Shortness of breath with activity   Further, I certify that my clinical findings support that this patient is homebound due to:  Shortness of Breath with activity   For home use only DME oxygen   Complete by:  As directed    Mode or (Route):  Nasal cannula   Liters per Minute:  2   Frequency:  Continuous (stationary and portable oxygen unit needed)   Oxygen delivery system:  Gas   Home Health   Complete by:  As directed    To provide the following care/treatments:   PT Home Health Aide Social work Respiratory Care       Allergies as of 08/19/2017      Reactions   Benadryl [diphenhydramine Hcl] Shortness Of Breath   Morphine And Related       Medication List    STOP taking these medications   diltiazem 120 MG tablet Commonly known as:  CARDIZEM     TAKE these medications   albuterol-ipratropium 18-103 MCG/ACT inhaler Commonly known as:  COMBIVENT Inhale 2 puffs into the lungs every 4 (four) hours. What  changed:  Another medication with the same name was added. Make sure you understand how and when to take each.   ipratropium-albuterol 0.5-2.5 (3) MG/3ML Soln Commonly known as:  DUONEB Take 3 mLs by nebulization every 6 (six) hours. What changed:  You were already taking a medication with the same name, and this prescription was added. Make sure you understand how and when to take each.   aspirin 325 MG tablet Take 325 mg by mouth daily.   azithromycin 250 MG tablet Commonly known as:  ZITHROMAX Z-PAK Take 2 tablets (500 mg) on  Day 1,  followed by 1 tablet (250 mg) once daily on Days 2 through 5.   CARTIA XT 120 MG 24 hr capsule Generic drug:  diltiazem TAKE ONE CAPSULE BY MOUTH EVERY DAY What changed:  Another medication with the same name was added. Make sure you understand how and when to take each.   diltiazem 180 MG 24 hr capsule Commonly known as:  CARDIZEM CD Take 1 capsule (180 mg total) by mouth daily. What changed:  You were  already taking a medication with the same name, and this prescription was added. Make sure you understand how and when to take each.   DULERA 200-5 MCG/ACT Aero Generic drug:  mometasone-formoterol Inhale 2 Inhalers into the lungs 2 (two) times daily.   furosemide 20 MG tablet Commonly known as:  LASIX Take 20 mg by mouth daily.   isosorbide mononitrate 30 MG 24 hr tablet Commonly known as:  IMDUR Take 1 tablet by mouth daily. Pt states he takes 15mg    loratadine 10 MG tablet Commonly known as:  CLARITIN Take 10 mg by mouth daily.   predniSONE 10 MG (21) Tbpk tablet Commonly known as:  STERAPRED UNI-PAK 21 TAB Taper by 10 mg daily            Durable Medical Equipment  (From admission, onward)        Start     Ordered   08/19/17 0000  DME Nebulizer machine    Question:  Patient needs a nebulizer to treat with the following condition  Answer:  Shortness of breath   08/19/17 1054   08/19/17 0000  For home use only DME oxygen     Question Answer Comment  Mode or (Route) Nasal cannula   Liters per Minute 2   Frequency Continuous (stationary and portable oxygen unit needed)   Oxygen delivery system Gas      08/19/17 1054        Diet and Activity recommendation: See Discharge Instructions above   Consults obtained -physical therapy, case management   Major procedures and Radiology Reports - PLEASE review detailed and final reports for all details, in brief -      Dg Chest Portable 1 View  Result Date: 08/13/2017 CLINICAL DATA:  EMS pt from home in respiratory distress; pt reports shortness of breath over the last few days that has worsened significantly in the last hour or 2; pt restless; History of COPD; unable to talk in full sentences EXAM: PORTABLE CHEST 1 VIEW COMPARISON:  03/06/2016 FINDINGS: Cardiopericardial silhouette appears enlarged, also accentuated by the technique. The lungs are clear. No pulmonary edema. IMPRESSION: Enlarged cardiac silhouette. Consider cardiomegaly or pericardial effusion. No pulmonary edema . Electronically Signed   By: Norva Pavlov M.D.   On: 08/13/2017 22:03    Micro Results     Recent Results (from the past 240 hour(s))  Culture, blood (routine x 2)     Status: None   Collection Time: 08/13/17  7:50 PM  Result Value Ref Range Status   Specimen Description BLOOD RIGHT HAND  Final   Special Requests   Final    BOTTLES DRAWN AEROBIC AND ANAEROBIC Blood Culture adequate volume   Culture   Final    NO GROWTH 5 DAYS Performed at [facility], [address], [place], [place] [address]    Report Status 08/18/2017 FINAL  Final  Culture, blood (routine x 2)     Status: None   Collection Time: 08/13/17  8:05 PM  Result Value Ref Range Status   Specimen Description BLOOD LEFT HAND  Final   Special Requests   Final    BOTTLES DRAWN AEROBIC AND ANAEROBIC Blood Culture adequate volume   Culture   Final    NO GROWTH 5 DAYS Performed at [facility], [address], [place], [place] [address]    Report Status 08/18/2017 FINAL  Final       Today   Subjective:   Caileb Ress today has no  headache,no chest abdominal pain,no new weakness tingling or numbness, feels much better wants to go home today.   Objective:   Blood pressure (!) 143/87, pulse 99, temperature (!) 97.4 F (36.3 C), temperature source Oral, resp. rate 18, height 5\' 10"  (1.778 m), weight 114.8 kg (253 lb), SpO2 99 %.  No intake or output data in the 24 hours ending 08/22/17 1547  Exam Awake Alert, Oriented x 3, No new F.N deficits, Normal affect [place].AT,PERRAL Supple Neck,No JVD, No cervical lymphadenopathy appriciated.  Symmetrical Chest wall movement, Good air movement bilaterally, CTAB RRR,No Gallops,Rubs or new Murmurs, No Parasternal Heave +ve B.Sounds, Abd Soft, Non tender, No organomegaly appriciated, No rebound -guarding or rigidity. No Cyanosis, Clubbing or edema, No new Rash or bruise  Data Review   CBC w Diff:  Lab Results  Component Value Date   WBC 8.3 08/14/2017   HGB 16.4 08/14/2017   HGB 14.2 07/19/2013   HCT 50.3 08/14/2017   HCT 42.2 07/19/2013   PLT 329 08/14/2017   PLT 238 07/19/2013   LYMPHOPCT 26 08/13/2017   LYMPHOPCT 16.7 07/19/2013   MONOPCT 21 08/13/2017   MONOPCT 12.3 07/19/2013   EOSPCT 2 08/13/2017   EOSPCT 0.9 07/19/2013   BASOPCT 0 08/13/2017   BASOPCT 0.3 07/19/2013    CMP:  Lab Results  Component Value Date   NA 137 08/14/2017   NA 135 (L) 07/19/2013   K 4.1 08/14/2017   K 3.9 07/19/2013   CL 100 (L) 08/14/2017   CL 101 07/19/2013   CO2 25 08/14/2017   CO2 29 07/19/2013   BUN 13 08/14/2017   BUN 10 07/19/2013   CREATININE 0.71 08/19/2017   CREATININE 0.80 07/19/2013   PROT 6.6 07/19/2013   ALBUMIN 2.8 (L) 07/19/2013   BILITOT 1.6 (H) 07/19/2013   ALKPHOS 85 07/19/2013   AST 33 07/19/2013   ALT 32 07/19/2013  .   Total Time in preparing paper work, data evaluation and todays exam - 35  minutes  Katha Hamming M.D on 08/19/17 at 3:47 PM    Note: This dictation was prepared with Dragon dictation along with smaller phrase technology. Any transcriptional errors that result from this process are unintentional.

## 2017-08-24 DIAGNOSIS — Z9981 Dependence on supplemental oxygen: Secondary | ICD-10-CM | POA: Diagnosis not present

## 2017-08-24 DIAGNOSIS — J209 Acute bronchitis, unspecified: Secondary | ICD-10-CM | POA: Diagnosis not present

## 2017-08-24 DIAGNOSIS — I509 Heart failure, unspecified: Secondary | ICD-10-CM | POA: Diagnosis not present

## 2017-08-24 DIAGNOSIS — I4891 Unspecified atrial fibrillation: Secondary | ICD-10-CM | POA: Diagnosis not present

## 2017-08-24 DIAGNOSIS — Z7982 Long term (current) use of aspirin: Secondary | ICD-10-CM | POA: Diagnosis not present

## 2017-08-24 DIAGNOSIS — K219 Gastro-esophageal reflux disease without esophagitis: Secondary | ICD-10-CM | POA: Diagnosis not present

## 2017-08-24 DIAGNOSIS — J441 Chronic obstructive pulmonary disease with (acute) exacerbation: Secondary | ICD-10-CM | POA: Diagnosis not present

## 2017-08-24 DIAGNOSIS — I252 Old myocardial infarction: Secondary | ICD-10-CM | POA: Diagnosis not present

## 2017-08-24 DIAGNOSIS — I110 Hypertensive heart disease with heart failure: Secondary | ICD-10-CM | POA: Diagnosis not present

## 2017-08-24 DIAGNOSIS — I2510 Atherosclerotic heart disease of native coronary artery without angina pectoris: Secondary | ICD-10-CM | POA: Diagnosis not present

## 2017-08-24 DIAGNOSIS — J440 Chronic obstructive pulmonary disease with acute lower respiratory infection: Secondary | ICD-10-CM | POA: Diagnosis not present

## 2017-08-24 DIAGNOSIS — Z87891 Personal history of nicotine dependence: Secondary | ICD-10-CM | POA: Diagnosis not present

## 2017-08-25 ENCOUNTER — Telehealth: Payer: Self-pay

## 2017-08-25 DIAGNOSIS — J209 Acute bronchitis, unspecified: Secondary | ICD-10-CM | POA: Diagnosis not present

## 2017-08-25 DIAGNOSIS — I2510 Atherosclerotic heart disease of native coronary artery without angina pectoris: Secondary | ICD-10-CM | POA: Diagnosis not present

## 2017-08-25 DIAGNOSIS — I4891 Unspecified atrial fibrillation: Secondary | ICD-10-CM | POA: Diagnosis not present

## 2017-08-25 DIAGNOSIS — Z87891 Personal history of nicotine dependence: Secondary | ICD-10-CM | POA: Diagnosis not present

## 2017-08-25 DIAGNOSIS — I252 Old myocardial infarction: Secondary | ICD-10-CM | POA: Diagnosis not present

## 2017-08-25 DIAGNOSIS — Z9981 Dependence on supplemental oxygen: Secondary | ICD-10-CM | POA: Diagnosis not present

## 2017-08-25 DIAGNOSIS — K219 Gastro-esophageal reflux disease without esophagitis: Secondary | ICD-10-CM | POA: Diagnosis not present

## 2017-08-25 DIAGNOSIS — I509 Heart failure, unspecified: Secondary | ICD-10-CM | POA: Diagnosis not present

## 2017-08-25 DIAGNOSIS — I110 Hypertensive heart disease with heart failure: Secondary | ICD-10-CM | POA: Diagnosis not present

## 2017-08-25 DIAGNOSIS — J440 Chronic obstructive pulmonary disease with acute lower respiratory infection: Secondary | ICD-10-CM | POA: Diagnosis not present

## 2017-08-25 DIAGNOSIS — J441 Chronic obstructive pulmonary disease with (acute) exacerbation: Secondary | ICD-10-CM | POA: Diagnosis not present

## 2017-08-25 DIAGNOSIS — Z7982 Long term (current) use of aspirin: Secondary | ICD-10-CM | POA: Diagnosis not present

## 2017-08-25 NOTE — Telephone Encounter (Signed)
[facility] [phone]) PHYSICAL [facility]ERPHY CALLED FOR VERBAL ORDER FOR PT FOR 2 TIMES A WEEK FOR 3 WEEKS I GAVE VERBAL AS PER DR Beverely RisenFOZIA KHAN

## 2017-08-28 DIAGNOSIS — I509 Heart failure, unspecified: Secondary | ICD-10-CM | POA: Diagnosis not present

## 2017-08-28 DIAGNOSIS — J440 Chronic obstructive pulmonary disease with acute lower respiratory infection: Secondary | ICD-10-CM | POA: Diagnosis not present

## 2017-08-28 DIAGNOSIS — I4891 Unspecified atrial fibrillation: Secondary | ICD-10-CM | POA: Diagnosis not present

## 2017-08-28 DIAGNOSIS — Z7982 Long term (current) use of aspirin: Secondary | ICD-10-CM | POA: Diagnosis not present

## 2017-08-28 DIAGNOSIS — J209 Acute bronchitis, unspecified: Secondary | ICD-10-CM | POA: Diagnosis not present

## 2017-08-28 DIAGNOSIS — I252 Old myocardial infarction: Secondary | ICD-10-CM | POA: Diagnosis not present

## 2017-08-28 DIAGNOSIS — Z87891 Personal history of nicotine dependence: Secondary | ICD-10-CM | POA: Diagnosis not present

## 2017-08-28 DIAGNOSIS — I2510 Atherosclerotic heart disease of native coronary artery without angina pectoris: Secondary | ICD-10-CM | POA: Diagnosis not present

## 2017-08-28 DIAGNOSIS — I110 Hypertensive heart disease with heart failure: Secondary | ICD-10-CM | POA: Diagnosis not present

## 2017-08-28 DIAGNOSIS — Z9981 Dependence on supplemental oxygen: Secondary | ICD-10-CM | POA: Diagnosis not present

## 2017-08-28 DIAGNOSIS — K219 Gastro-esophageal reflux disease without esophagitis: Secondary | ICD-10-CM | POA: Diagnosis not present

## 2017-08-28 DIAGNOSIS — J441 Chronic obstructive pulmonary disease with (acute) exacerbation: Secondary | ICD-10-CM | POA: Diagnosis not present

## 2017-08-29 DIAGNOSIS — I2510 Atherosclerotic heart disease of native coronary artery without angina pectoris: Secondary | ICD-10-CM | POA: Diagnosis not present

## 2017-08-29 DIAGNOSIS — Z7982 Long term (current) use of aspirin: Secondary | ICD-10-CM | POA: Diagnosis not present

## 2017-08-29 DIAGNOSIS — J209 Acute bronchitis, unspecified: Secondary | ICD-10-CM | POA: Diagnosis not present

## 2017-08-29 DIAGNOSIS — Z9981 Dependence on supplemental oxygen: Secondary | ICD-10-CM | POA: Diagnosis not present

## 2017-08-29 DIAGNOSIS — Z87891 Personal history of nicotine dependence: Secondary | ICD-10-CM | POA: Diagnosis not present

## 2017-08-29 DIAGNOSIS — J440 Chronic obstructive pulmonary disease with acute lower respiratory infection: Secondary | ICD-10-CM | POA: Diagnosis not present

## 2017-08-29 DIAGNOSIS — I110 Hypertensive heart disease with heart failure: Secondary | ICD-10-CM | POA: Diagnosis not present

## 2017-08-29 DIAGNOSIS — J441 Chronic obstructive pulmonary disease with (acute) exacerbation: Secondary | ICD-10-CM | POA: Diagnosis not present

## 2017-08-29 DIAGNOSIS — I252 Old myocardial infarction: Secondary | ICD-10-CM | POA: Diagnosis not present

## 2017-08-29 DIAGNOSIS — I4891 Unspecified atrial fibrillation: Secondary | ICD-10-CM | POA: Diagnosis not present

## 2017-08-29 DIAGNOSIS — I509 Heart failure, unspecified: Secondary | ICD-10-CM | POA: Diagnosis not present

## 2017-08-29 DIAGNOSIS — K219 Gastro-esophageal reflux disease without esophagitis: Secondary | ICD-10-CM | POA: Diagnosis not present

## 2017-08-30 DIAGNOSIS — Z9981 Dependence on supplemental oxygen: Secondary | ICD-10-CM | POA: Diagnosis not present

## 2017-08-30 DIAGNOSIS — J440 Chronic obstructive pulmonary disease with acute lower respiratory infection: Secondary | ICD-10-CM | POA: Diagnosis not present

## 2017-08-30 DIAGNOSIS — I2510 Atherosclerotic heart disease of native coronary artery without angina pectoris: Secondary | ICD-10-CM | POA: Diagnosis not present

## 2017-08-30 DIAGNOSIS — I252 Old myocardial infarction: Secondary | ICD-10-CM | POA: Diagnosis not present

## 2017-08-30 DIAGNOSIS — Z7982 Long term (current) use of aspirin: Secondary | ICD-10-CM | POA: Diagnosis not present

## 2017-08-30 DIAGNOSIS — Z87891 Personal history of nicotine dependence: Secondary | ICD-10-CM | POA: Diagnosis not present

## 2017-08-30 DIAGNOSIS — I509 Heart failure, unspecified: Secondary | ICD-10-CM | POA: Diagnosis not present

## 2017-08-30 DIAGNOSIS — I4891 Unspecified atrial fibrillation: Secondary | ICD-10-CM | POA: Diagnosis not present

## 2017-08-30 DIAGNOSIS — J441 Chronic obstructive pulmonary disease with (acute) exacerbation: Secondary | ICD-10-CM | POA: Diagnosis not present

## 2017-08-30 DIAGNOSIS — J209 Acute bronchitis, unspecified: Secondary | ICD-10-CM | POA: Diagnosis not present

## 2017-08-30 DIAGNOSIS — K219 Gastro-esophageal reflux disease without esophagitis: Secondary | ICD-10-CM | POA: Diagnosis not present

## 2017-08-30 DIAGNOSIS — I110 Hypertensive heart disease with heart failure: Secondary | ICD-10-CM | POA: Diagnosis not present

## 2017-08-31 DIAGNOSIS — I2510 Atherosclerotic heart disease of native coronary artery without angina pectoris: Secondary | ICD-10-CM | POA: Diagnosis not present

## 2017-08-31 DIAGNOSIS — I4891 Unspecified atrial fibrillation: Secondary | ICD-10-CM | POA: Diagnosis not present

## 2017-08-31 DIAGNOSIS — J441 Chronic obstructive pulmonary disease with (acute) exacerbation: Secondary | ICD-10-CM | POA: Diagnosis not present

## 2017-08-31 DIAGNOSIS — Z9981 Dependence on supplemental oxygen: Secondary | ICD-10-CM | POA: Diagnosis not present

## 2017-08-31 DIAGNOSIS — Z7982 Long term (current) use of aspirin: Secondary | ICD-10-CM | POA: Diagnosis not present

## 2017-08-31 DIAGNOSIS — K219 Gastro-esophageal reflux disease without esophagitis: Secondary | ICD-10-CM | POA: Diagnosis not present

## 2017-08-31 DIAGNOSIS — I509 Heart failure, unspecified: Secondary | ICD-10-CM | POA: Diagnosis not present

## 2017-08-31 DIAGNOSIS — I110 Hypertensive heart disease with heart failure: Secondary | ICD-10-CM | POA: Diagnosis not present

## 2017-08-31 DIAGNOSIS — J209 Acute bronchitis, unspecified: Secondary | ICD-10-CM | POA: Diagnosis not present

## 2017-08-31 DIAGNOSIS — J440 Chronic obstructive pulmonary disease with acute lower respiratory infection: Secondary | ICD-10-CM | POA: Diagnosis not present

## 2017-08-31 DIAGNOSIS — Z87891 Personal history of nicotine dependence: Secondary | ICD-10-CM | POA: Diagnosis not present

## 2017-08-31 DIAGNOSIS — I252 Old myocardial infarction: Secondary | ICD-10-CM | POA: Diagnosis not present

## 2017-09-04 DIAGNOSIS — I2510 Atherosclerotic heart disease of native coronary artery without angina pectoris: Secondary | ICD-10-CM | POA: Diagnosis not present

## 2017-09-04 DIAGNOSIS — I252 Old myocardial infarction: Secondary | ICD-10-CM | POA: Diagnosis not present

## 2017-09-04 DIAGNOSIS — Z9981 Dependence on supplemental oxygen: Secondary | ICD-10-CM | POA: Diagnosis not present

## 2017-09-04 DIAGNOSIS — I4891 Unspecified atrial fibrillation: Secondary | ICD-10-CM | POA: Diagnosis not present

## 2017-09-04 DIAGNOSIS — Z7982 Long term (current) use of aspirin: Secondary | ICD-10-CM | POA: Diagnosis not present

## 2017-09-04 DIAGNOSIS — J440 Chronic obstructive pulmonary disease with acute lower respiratory infection: Secondary | ICD-10-CM | POA: Diagnosis not present

## 2017-09-04 DIAGNOSIS — J441 Chronic obstructive pulmonary disease with (acute) exacerbation: Secondary | ICD-10-CM | POA: Diagnosis not present

## 2017-09-04 DIAGNOSIS — K219 Gastro-esophageal reflux disease without esophagitis: Secondary | ICD-10-CM | POA: Diagnosis not present

## 2017-09-04 DIAGNOSIS — Z87891 Personal history of nicotine dependence: Secondary | ICD-10-CM | POA: Diagnosis not present

## 2017-09-04 DIAGNOSIS — I509 Heart failure, unspecified: Secondary | ICD-10-CM | POA: Diagnosis not present

## 2017-09-04 DIAGNOSIS — I110 Hypertensive heart disease with heart failure: Secondary | ICD-10-CM | POA: Diagnosis not present

## 2017-09-04 DIAGNOSIS — J209 Acute bronchitis, unspecified: Secondary | ICD-10-CM | POA: Diagnosis not present

## 2017-09-05 ENCOUNTER — Telehealth: Payer: Self-pay

## 2017-09-05 DIAGNOSIS — Z7982 Long term (current) use of aspirin: Secondary | ICD-10-CM | POA: Diagnosis not present

## 2017-09-05 DIAGNOSIS — I110 Hypertensive heart disease with heart failure: Secondary | ICD-10-CM | POA: Diagnosis not present

## 2017-09-05 DIAGNOSIS — I4891 Unspecified atrial fibrillation: Secondary | ICD-10-CM | POA: Diagnosis not present

## 2017-09-05 DIAGNOSIS — I509 Heart failure, unspecified: Secondary | ICD-10-CM | POA: Diagnosis not present

## 2017-09-05 DIAGNOSIS — J209 Acute bronchitis, unspecified: Secondary | ICD-10-CM | POA: Diagnosis not present

## 2017-09-05 DIAGNOSIS — Z87891 Personal history of nicotine dependence: Secondary | ICD-10-CM | POA: Diagnosis not present

## 2017-09-05 DIAGNOSIS — Z9981 Dependence on supplemental oxygen: Secondary | ICD-10-CM | POA: Diagnosis not present

## 2017-09-05 DIAGNOSIS — J440 Chronic obstructive pulmonary disease with acute lower respiratory infection: Secondary | ICD-10-CM | POA: Diagnosis not present

## 2017-09-05 DIAGNOSIS — J441 Chronic obstructive pulmonary disease with (acute) exacerbation: Secondary | ICD-10-CM | POA: Diagnosis not present

## 2017-09-05 DIAGNOSIS — K219 Gastro-esophageal reflux disease without esophagitis: Secondary | ICD-10-CM | POA: Diagnosis not present

## 2017-09-05 DIAGNOSIS — I252 Old myocardial infarction: Secondary | ICD-10-CM | POA: Diagnosis not present

## 2017-09-05 DIAGNOSIS — I2510 Atherosclerotic heart disease of native coronary artery without angina pectoris: Secondary | ICD-10-CM | POA: Diagnosis not present

## 2017-09-05 NOTE — Telephone Encounter (Signed)
Spoke to Renovohris and advised that pt should have had an appt with DFK and DSK after hospital stay.  He needs to be seen for his swelling.  dbs

## 2017-09-07 DIAGNOSIS — I4891 Unspecified atrial fibrillation: Secondary | ICD-10-CM | POA: Diagnosis not present

## 2017-09-07 DIAGNOSIS — Z7982 Long term (current) use of aspirin: Secondary | ICD-10-CM | POA: Diagnosis not present

## 2017-09-07 DIAGNOSIS — J209 Acute bronchitis, unspecified: Secondary | ICD-10-CM | POA: Diagnosis not present

## 2017-09-07 DIAGNOSIS — Z9981 Dependence on supplemental oxygen: Secondary | ICD-10-CM | POA: Diagnosis not present

## 2017-09-07 DIAGNOSIS — I252 Old myocardial infarction: Secondary | ICD-10-CM | POA: Diagnosis not present

## 2017-09-07 DIAGNOSIS — I110 Hypertensive heart disease with heart failure: Secondary | ICD-10-CM | POA: Diagnosis not present

## 2017-09-07 DIAGNOSIS — Z87891 Personal history of nicotine dependence: Secondary | ICD-10-CM | POA: Diagnosis not present

## 2017-09-07 DIAGNOSIS — J440 Chronic obstructive pulmonary disease with acute lower respiratory infection: Secondary | ICD-10-CM | POA: Diagnosis not present

## 2017-09-07 DIAGNOSIS — I509 Heart failure, unspecified: Secondary | ICD-10-CM | POA: Diagnosis not present

## 2017-09-07 DIAGNOSIS — J441 Chronic obstructive pulmonary disease with (acute) exacerbation: Secondary | ICD-10-CM | POA: Diagnosis not present

## 2017-09-07 DIAGNOSIS — I2510 Atherosclerotic heart disease of native coronary artery without angina pectoris: Secondary | ICD-10-CM | POA: Diagnosis not present

## 2017-09-07 DIAGNOSIS — K219 Gastro-esophageal reflux disease without esophagitis: Secondary | ICD-10-CM | POA: Diagnosis not present

## 2017-09-11 ENCOUNTER — Encounter: Payer: Self-pay | Admitting: Internal Medicine

## 2017-09-11 ENCOUNTER — Ambulatory Visit: Payer: Medicare Other | Admitting: Internal Medicine

## 2017-09-11 VITALS — BP 135/70 | HR 62 | Resp 16 | Ht 70.0 in | Wt 250.8 lb

## 2017-09-11 DIAGNOSIS — I6523 Occlusion and stenosis of bilateral carotid arteries: Secondary | ICD-10-CM | POA: Insufficient documentation

## 2017-09-11 DIAGNOSIS — M549 Dorsalgia, unspecified: Secondary | ICD-10-CM | POA: Insufficient documentation

## 2017-09-11 DIAGNOSIS — I482 Chronic atrial fibrillation, unspecified: Secondary | ICD-10-CM

## 2017-09-11 DIAGNOSIS — I2583 Coronary atherosclerosis due to lipid rich plaque: Secondary | ICD-10-CM

## 2017-09-11 DIAGNOSIS — J9611 Chronic respiratory failure with hypoxia: Secondary | ICD-10-CM | POA: Diagnosis not present

## 2017-09-11 DIAGNOSIS — I2510 Atherosclerotic heart disease of native coronary artery without angina pectoris: Secondary | ICD-10-CM | POA: Diagnosis not present

## 2017-09-11 DIAGNOSIS — I5022 Chronic systolic (congestive) heart failure: Secondary | ICD-10-CM

## 2017-09-11 DIAGNOSIS — J449 Chronic obstructive pulmonary disease, unspecified: Secondary | ICD-10-CM

## 2017-09-11 DIAGNOSIS — I279 Pulmonary heart disease, unspecified: Secondary | ICD-10-CM | POA: Insufficient documentation

## 2017-09-11 NOTE — Progress Notes (Signed)
[facility] [place], [place] [address]  Pulmonary Sleep Medicine  Office Visit Note  Patient Name: Erik [name] DOB: 1956/03/07 MRN [id number]  Date of Service: 09/11/2017  Complaints/HPI: He was admitted to the hospital with an acute COPD exacerbation. Patient is on oxygen now not smoking. He is around smokers though. He feels a little better back to baseline. NO CHF and no pneumonia noted on the cxr. No cough at this time no chest pain noted  ROS  General: (-) fever, (-) chills, (-) night sweats, (-) weakness Skin: (-) rashes, (-) itching,. Eyes: (-) visual changes, (-) redness, (-) itching. Nose and Sinuses: (-) nasal stuffiness or itchiness, (-) postnasal drip, (-) nosebleeds, (-) sinus trouble. Mouth and Throat: (-) sore throat, (-) hoarseness. Neck: (-) swollen glands, (-) enlarged thyroid, (-) neck pain. Respiratory: - cough, (-) bloody sputum, +shortness of breath, - wheezing. Cardiovascular: - ankle swelling, (-) chest pain. Lymphatic: (-) lymph node enlargement. Neurologic: (-) numbness, (-) tingling. Psychiatric: (-) anxiety, (-) depression   Current Medication: Outpatient Encounter Medications as of 09/11/2017  Medication Sig  . albuterol-ipratropium (COMBIVENT) 18-103 MCG/ACT inhaler Inhale 2 puffs into the lungs every 4 (four) hours.  [name] aspirin 325 MG tablet Take 325 mg by mouth daily.  [name] diltiazem (CARDIZEM CD) 180 MG 24 hr capsule Take 1 capsule (180 mg total) by mouth daily.  . furosemide (LASIX) 20 MG tablet Take 20 mg by mouth daily.  [name] ipratropium-albuterol (DUONEB) 0.5-2.5 (3) MG/3ML SOLN Take 3 mLs by nebulization every 6 (six) hours.  . isosorbide mononitrate (IMDUR) 30 MG 24 hr tablet Take 1 tablet by mouth daily. Pt states he takes 17m  . loratadine (CLARITIN) 10 MG tablet Take 10 mg by mouth daily.  . mometasone-formoterol (DULERA) 200-5 MCG/ACT AERO Inhale 2 Inhalers into the lungs 2 (two) times daily.  .Marland Kitchendiltiazem (CARTIA  XT) 120 MG 24 hr capsule TAKE ONE CAPSULE BY MOUTH EVERY DAY  . predniSONE (STERAPRED UNI-PAK 21 TAB) 10 MG (21) TBPK tablet Taper by 10 mg daily (Patient not taking: Reported on 09/11/2017)   No facility-administered encounter medications on file as of 09/11/2017.     Surgical History: Past Surgical History:  Procedure Laterality Date  . ANGIOPLASTY  2009  . CARDIAC CATHETERIZATION N/A 03/18/2015   Procedure: Left Heart Cath with Coronary Angiography;  Surgeon: BCorey Skains MD;  [facility];  Service: Cardiovascular;  Laterality: N/A;  . CHOLECYSTECTOMY  2005  . CORONARY ANGIOGRAM  2009    Medical History: Past Medical History:  Diagnosis Date  . Arthritis   . Asthma   . Atrial fibrillation ([facility]   . CHF (congestive heart failure) ([facility]   . COPD (chronic obstructive pulmonary disease) ([facility]   . Coronary artery disease   . GERD (gastroesophageal reflux disease)   . Hypertension   . Myocardial infarction ([place]   . Shortness of breath dyspnea     Family History: Family History  Problem Relation Age of Onset  . Coronary artery disease Father   . Diabetes Father   . Hyperlipidemia Father   . Hypertension Father     Social History: Social History   Socioeconomic History  . Marital status: Married    Spouse name: Not on file  . Number of children: Not on file  . Years of education: Not on file  . Highest education level: Not on file  Social Needs  . Financial resource strain: Not on file  .  Food insecurity - worry: Not on file  . Food insecurity - inability: Not on file  . Transportation needs - medical: Not on file  . Transportation needs - non-medical: Not on file  Occupational History  . Not on file  Tobacco Use  . Smoking status: Former Smoker    Years: 25.00    Last attempt to quit: 03/20/1997    Years since quitting: 20.4  . Smokeless tobacco: Never Used  Substance and Sexual Activity  . Alcohol use: No  . Drug use: No  . Sexual  activity: Not on file  Other Topics Concern  . Not on file  Social History Narrative  . Not on file    Vital Signs: Blood pressure 135/70, pulse 62, resp. rate 16, height 5' 10"  (1.778 m), weight 250 lb 12.8 oz (113.8 kg), SpO2 96 %.  Examination: General Appearance: The patient is well-developed, well-nourished, and in no distress. Skin: Gross inspection of skin unremarkable. Head: normocephalic, no gross deformities. Eyes: no gross deformities noted. ENT: ears appear grossly normal no exudates. Neck: Supple. No thyromegaly. No LAD. Respiratory: clear. Cardiovascular: Normal S1 and S2 without murmur or rub. Extremities: No cyanosis. pulses are equal. Neurologic: Alert and oriented. No involuntary movements.  LABS: Recent Results (from the past 2160 hour(s))  Basic metabolic panel     Status: Abnormal   Collection Time: 08/13/17  7:40 PM  Result Value Ref Range   Sodium 137 135 - 145 mmol/L   Potassium 4.5 3.5 - 5.1 mmol/L    Comment: HEMOLYSIS AT THIS LEVEL MAY AFFECT RESULT   Chloride 103 101 - 111 mmol/L   CO2 24 22 - 32 mmol/L   Glucose, Bld 119 (H) 65 - 99 mg/dL   BUN 14 6 - 20 mg/dL   Creatinine, Ser 0.87 0.61 - 1.24 mg/dL   Calcium 8.8 (L) 8.9 - 10.3 mg/dL   GFR calc non Af Amer >60 >60 mL/min   GFR calc Af Amer >60 >60 mL/min    Comment: (NOTE) The eGFR has been calculated using the CKD EPI equation. This calculation has not been validated in all clinical situations. eGFR's persistently <60 mL/min signify possible Chronic Kidney Disease.    Anion gap 10 5 - 15    Comment: Performed at [facility], [address] [place], [place] [address]  CBC with Differential     Status: Abnormal   Collection Time: 08/13/17  7:40 PM  Result Value Ref Range   WBC 6.9 3.8 - 10.6 K/uL   RBC 4.44 4.40 - 5.90 MIL/uL   Hemoglobin 13.5 13.0 - 18.0 g/dL   HCT 40.5 40.0 - 52.0 %   MCV 91.3 80.0 - 100.0 fL   MCH 30.3 26.0 - 34.0 pg   MCHC 33.2 32.0 - 36.0 g/dL   RDW  14.3 11.5 - 14.5 %   Platelets 220 150 - 440 K/uL   Neutrophils Relative % 51 %   Neutro Abs 3.5 1.4 - 6.5 K/uL   Lymphocytes Relative 26 %   Lymphs Abs 1.8 1.0 - 3.6 K/uL   Monocytes Relative 21 %   Monocytes Absolute 1.5 (H) 0.2 - 1.0 K/uL   Eosinophils Relative 2 %   Eosinophils Absolute 0.1 0 - 0.7 K/uL   Basophils Relative 0 %   Basophils Absolute 0.0 0 - 0.1 K/uL    Comment: Performed at [facility], [address] [place], [place] [address]  Troponin I     Status: None  Collection Time: 08/13/17  7:40 PM  Result Value Ref Range   Troponin I <0.03 <0.03 ng/mL    Comment: Performed at [facility], [address] [place], [place] [address]  Brain natriuretic peptide     Status: Abnormal   Collection Time: 08/13/17  7:40 PM  Result Value Ref Range   B Natriuretic Peptide 187.0 (H) 0.0 - 100.0 pg/mL    Comment: Performed at [facility] [address] [place], [place] [address]  Lactic acid, plasma     Status: None   Collection Time: 08/13/17  7:40 PM  Result Value Ref Range   Lactic Acid, Venous 1.3 0.5 - 1.9 mmol/L    Comment: Performed at [facility], [address] [place], [place] [address]  Blood gas, venous     Status: Abnormal   Collection Time: 08/13/17  7:41 PM  Result Value Ref Range   Delivery systems BILEVEL POSITIVE AIRWAY PRESSURE    pH, Ven 7.39 7.250 - 7.430   pCO2, Ven 41 (L) 44.0 - 60.0 mmHg   pO2, Ven 71.0 (H) 32.0 - 45.0 mmHg   Bicarbonate 24.8 20.0 - 28.0 mmol/L   Acid-base deficit 0.2 0.0 - 2.0 mmol/L   O2 Saturation 93.9 %   Patient temperature 37.0    Collection site LINE    Sample type VENOUS     Comment: Performed at [facility], [address] [place], [place] [address]  Culture, blood (routine x 2)     Status: None   Collection Time: 08/13/17  7:50 PM  Result Value Ref Range   Specimen Description BLOOD RIGHT HAND    Special Requests      BOTTLES DRAWN AEROBIC AND ANAEROBIC Blood  Culture adequate volume   Culture      NO GROWTH 5 DAYS Performed at [facility], [address] [place], [place] [address]    Report Status 08/18/2017 FINAL   Influenza panel by PCR (type A & B)     Status: None   Collection Time: 08/13/17  7:58 PM  Result Value Ref Range   Influenza A By PCR NEGATIVE NEGATIVE   Influenza B By PCR NEGATIVE NEGATIVE    Comment: (NOTE) The Xpert Xpress Flu assay is intended as an aid in the diagnosis of  influenza and should not be used as a sole basis for treatment.  This  assay is FDA approved for nasopharyngeal swab specimens only. Nasal  washings and aspirates are unacceptable for Xpert Xpress Flu testing. Performed at [facility], [address] [place],  [address]   Culture, blood (routine x 2)     Status: None   Collection Time: 08/13/17  8:05 PM  Result Value Ref Range   Specimen Description BLOOD LEFT HAND    Special Requests      BOTTLES DRAWN AEROBIC AND ANAEROBIC Blood Culture adequate volume   Culture      NO GROWTH 5 DAYS Performed at [facility], [address] [place],  [address]    Report Status 08/18/2017 FINAL   Urinalysis, Complete w Microscopic     Status: Abnormal   Collection Time: 08/13/17 11:10 PM  Result Value Ref Range   Color, Urine YELLOW (A) YELLOW   APPearance CLEAR (A) CLEAR   Specific Gravity, Urine 1.014 1.005 - 1.030   pH 5.0 5.0 - 8.0   Glucose, UA NEGATIVE NEGATIVE mg/dL   Hgb urine dipstick SMALL (A) NEGATIVE   Bilirubin  Urine NEGATIVE NEGATIVE   Ketones, ur NEGATIVE NEGATIVE mg/dL   Protein, ur 30 (A) NEGATIVE mg/dL   Nitrite NEGATIVE NEGATIVE   Leukocytes, UA NEGATIVE NEGATIVE   RBC / HPF 0-5 0 - 5 RBC/hpf   WBC, UA 0-5 0 - 5 WBC/hpf   Bacteria, UA NONE SEEN NONE SEEN   Squamous Epithelial / LPF 0-5 (A) NONE SEEN   Mucus PRESENT    Hyaline Casts, UA PRESENT     Comment: Performed at [facility], [address] [place], [place]  [address]  Basic metabolic panel     Status: Abnormal   Collection Time: 08/14/17  5:27 AM  Result Value Ref Range   Sodium 137 135 - 145 mmol/L   Potassium 4.1 3.5 - 5.1 mmol/L   Chloride 100 (L) 101 - 111 mmol/L   CO2 25 22 - 32 mmol/L   Glucose, Bld 170 (H) 65 - 99 mg/dL   BUN 13 6 - 20 mg/dL   Creatinine, Ser 0.76 0.61 - 1.24 mg/dL   Calcium 9.1 8.9 - 10.3 mg/dL   GFR calc non Af Amer >60 >60 mL/min   GFR calc Af Amer >60 >60 mL/min    Comment: (NOTE) The eGFR has been calculated using the CKD EPI equation. This calculation has not been validated in all clinical situations. eGFR's persistently <60 mL/min signify possible Chronic Kidney Disease.    Anion gap 12 5 - 15    Comment: Performed at [facility], [address] [place], [place] [address]  CBC     Status: Abnormal   Collection Time: 08/14/17  5:27 AM  Result Value Ref Range   WBC 8.3 3.8 - 10.6 K/uL   RBC 5.44 4.40 - 5.90 MIL/uL   Hemoglobin 16.4 13.0 - 18.0 g/dL   HCT 50.3 40.0 - 52.0 %   MCV 92.5 80.0 - 100.0 fL   MCH 30.1 26.0 - 34.0 pg   MCHC 32.5 32.0 - 36.0 g/dL   RDW 14.7 (H) 11.5 - 14.5 %   Platelets 329 150 - 440 K/uL    Comment: Performed at [facility], [address] [place], [place] [address]  HIV antibody     Status: None   Collection Time: 08/14/17  5:27 AM  Result Value Ref Range   HIV Screen 4th Generation wRfx Non Reactive Non Reactive    Comment: (NOTE) Performed At: [facility] [place], [place] [id number] Rush Farmer MD [phone] Performed at [facility], [address] [place], [place] [address]   Glucose, capillary     Status: Abnormal   Collection Time: 08/14/17  8:40 AM  Result Value Ref Range   Glucose-Capillary 132 (H) 65 - 99 mg/dL  ECHOCARDIOGRAM COMPLETE     Status: None   Collection Time: 08/14/17  2:13 PM  Result Value Ref Range   Weight 4,011.2 oz   Height 70 in   BP 135/86 mmHg  Glucose, capillary      Status: Abnormal   Collection Time: 08/14/17  8:44 PM  Result Value Ref Range   Glucose-Capillary 147 (H) 65 - 99 mg/dL  Magnesium     Status: None   Collection Time: 08/15/17  4:11 AM  Result Value Ref Range   Magnesium 2.0 1.7 - 2.4 mg/dL    Comment: Performed at [facility], [address] [place], [place] [address]  Glucose, capillary     Status: Abnormal   Collection Time: 08/15/17  7:14 AM  Result Value Ref Range   Glucose-Capillary 127 (H) 65 - 99 mg/dL  Glucose, capillary     Status: Abnormal   Collection Time: 08/16/17  8:04 AM  Result Value Ref Range   Glucose-Capillary 128 (H) 65 - 99 mg/dL   Comment 1 Notify RN   Glucose, capillary     Status: Abnormal   Collection Time: 08/17/17  7:58 AM  Result Value Ref Range   Glucose-Capillary 188 (H) 65 - 99 mg/dL  Glucose, capillary     Status: Abnormal   Collection Time: 08/18/17  7:58 AM  Result Value Ref Range   Glucose-Capillary 151 (H) 65 - 99 mg/dL   Comment 1 Notify RN   Glucose, capillary     Status: Abnormal   Collection Time: 08/18/17  5:03 PM  Result Value Ref Range   Glucose-Capillary 133 (H) 65 - 99 mg/dL  Creatinine, serum     Status: None   Collection Time: 08/19/17  5:31 AM  Result Value Ref Range   Creatinine, Ser 0.71 0.61 - 1.24 mg/dL   GFR calc non Af Amer >60 >60 mL/min   GFR calc Af Amer >60 >60 mL/min    Comment: (NOTE) The eGFR has been calculated using the CKD EPI equation. This calculation has not been validated in all clinical situations. eGFR's persistently <60 mL/min signify possible Chronic Kidney Disease. Performed at [facility], [address] [place], [place] [address]   Glucose, capillary     Status: Abnormal   Collection Time: 08/19/17  7:55 AM  Result Value Ref Range   Glucose-Capillary 157 (H) 65 - 99 mg/dL    Radiology: No results found.  No results found.  Dg Chest Portable 1 View  Result Date: 08/13/2017 CLINICAL DATA:  EMS pt from home in  respiratory distress; pt reports shortness of breath over the last few days that has worsened significantly in the last hour or 2; pt restless; History of COPD; unable to talk in full sentences EXAM: PORTABLE CHEST 1 VIEW COMPARISON:  03/06/2016 FINDINGS: Cardiopericardial silhouette appears enlarged, also accentuated by the technique. The lungs are clear. No pulmonary edema. IMPRESSION: Enlarged cardiac silhouette. Consider cardiomegaly or pericardial effusion. No pulmonary edema . Electronically Signed   By: Nolon Nations M.D.   On: 08/13/2017 22:03      Assessment and Plan: Patient Active Problem List   Diagnosis Date Noted  . Dorsalgia 09/11/2017  . Occlusion and stenosis of bilateral carotid arteries 09/11/2017  . Pulmonic heart disease ([place]) 09/11/2017  . COPD with hypoxia ([place]) 08/14/2017  . Chronic a-fib ([place]) 03/27/2015  . CAD (coronary artery disease) 03/02/2015  . 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  . Essential hypertension with goal blood pressure less than 130/80 02/23/2015    1. COPD doing better on oxygen will continue with inhlaers as prescribed Last FEV1 33% 2. CHF now compensated will follow with Dr Nehemiah Massed 3. CAD stable monitor 4. Chronic A fib rate is controlled at this time 5. Chronic respiratory failure with hypoxia on 2lpm O2 will be continuied  General Counseling: I have discussed the findings of the evaluation and examination with Erik [name].  I have also discussed any further diagnostic evaluation thatmay be needed or ordered today. Erik [name] verbalizes understanding of the findings of todays visit. We also reviewed his medications today and discussed drug interactions and side effects including but not limited excessive drowsiness and altered mental states. We also discussed that there is always a risk not just to him  but also people around him. he has been encouraged to call the office with any questions or concerns that should arise related to todays  visit.    Time spent: 23mn  I have personally obtained a history, examined the patient, evaluated laboratory and imaging results, formulated the assessment and plan and placed orders.    SAllyne Gee MD [facility]Pulmonary and Critical Care Sleep medicine

## 2017-09-11 NOTE — Patient Instructions (Signed)

## 2017-09-12 DIAGNOSIS — J441 Chronic obstructive pulmonary disease with (acute) exacerbation: Secondary | ICD-10-CM | POA: Diagnosis not present

## 2017-09-12 DIAGNOSIS — I4891 Unspecified atrial fibrillation: Secondary | ICD-10-CM | POA: Diagnosis not present

## 2017-09-12 DIAGNOSIS — I110 Hypertensive heart disease with heart failure: Secondary | ICD-10-CM | POA: Diagnosis not present

## 2017-09-12 DIAGNOSIS — I252 Old myocardial infarction: Secondary | ICD-10-CM | POA: Diagnosis not present

## 2017-09-12 DIAGNOSIS — J209 Acute bronchitis, unspecified: Secondary | ICD-10-CM | POA: Diagnosis not present

## 2017-09-12 DIAGNOSIS — K219 Gastro-esophageal reflux disease without esophagitis: Secondary | ICD-10-CM | POA: Diagnosis not present

## 2017-09-12 DIAGNOSIS — Z87891 Personal history of nicotine dependence: Secondary | ICD-10-CM | POA: Diagnosis not present

## 2017-09-12 DIAGNOSIS — I2510 Atherosclerotic heart disease of native coronary artery without angina pectoris: Secondary | ICD-10-CM | POA: Diagnosis not present

## 2017-09-12 DIAGNOSIS — Z9981 Dependence on supplemental oxygen: Secondary | ICD-10-CM | POA: Diagnosis not present

## 2017-09-12 DIAGNOSIS — I509 Heart failure, unspecified: Secondary | ICD-10-CM | POA: Diagnosis not present

## 2017-09-12 DIAGNOSIS — J440 Chronic obstructive pulmonary disease with acute lower respiratory infection: Secondary | ICD-10-CM | POA: Diagnosis not present

## 2017-09-12 DIAGNOSIS — Z7982 Long term (current) use of aspirin: Secondary | ICD-10-CM | POA: Diagnosis not present

## 2017-09-13 DIAGNOSIS — K219 Gastro-esophageal reflux disease without esophagitis: Secondary | ICD-10-CM | POA: Diagnosis not present

## 2017-09-13 DIAGNOSIS — Z7982 Long term (current) use of aspirin: Secondary | ICD-10-CM | POA: Diagnosis not present

## 2017-09-13 DIAGNOSIS — Z87891 Personal history of nicotine dependence: Secondary | ICD-10-CM | POA: Diagnosis not present

## 2017-09-13 DIAGNOSIS — J209 Acute bronchitis, unspecified: Secondary | ICD-10-CM | POA: Diagnosis not present

## 2017-09-13 DIAGNOSIS — Z9981 Dependence on supplemental oxygen: Secondary | ICD-10-CM | POA: Diagnosis not present

## 2017-09-13 DIAGNOSIS — J440 Chronic obstructive pulmonary disease with acute lower respiratory infection: Secondary | ICD-10-CM | POA: Diagnosis not present

## 2017-09-13 DIAGNOSIS — I110 Hypertensive heart disease with heart failure: Secondary | ICD-10-CM | POA: Diagnosis not present

## 2017-09-13 DIAGNOSIS — J441 Chronic obstructive pulmonary disease with (acute) exacerbation: Secondary | ICD-10-CM | POA: Diagnosis not present

## 2017-09-13 DIAGNOSIS — I2510 Atherosclerotic heart disease of native coronary artery without angina pectoris: Secondary | ICD-10-CM | POA: Diagnosis not present

## 2017-09-13 DIAGNOSIS — I252 Old myocardial infarction: Secondary | ICD-10-CM | POA: Diagnosis not present

## 2017-09-13 DIAGNOSIS — I509 Heart failure, unspecified: Secondary | ICD-10-CM | POA: Diagnosis not present

## 2017-09-13 DIAGNOSIS — I4891 Unspecified atrial fibrillation: Secondary | ICD-10-CM | POA: Diagnosis not present

## 2017-09-14 DIAGNOSIS — I482 Chronic atrial fibrillation: Secondary | ICD-10-CM | POA: Diagnosis not present

## 2017-09-14 DIAGNOSIS — E782 Mixed hyperlipidemia: Secondary | ICD-10-CM | POA: Diagnosis not present

## 2017-09-14 DIAGNOSIS — I25118 Atherosclerotic heart disease of native coronary artery with other forms of angina pectoris: Secondary | ICD-10-CM | POA: Diagnosis not present

## 2017-09-14 DIAGNOSIS — I10 Essential (primary) hypertension: Secondary | ICD-10-CM | POA: Diagnosis not present

## 2017-09-15 DIAGNOSIS — I2510 Atherosclerotic heart disease of native coronary artery without angina pectoris: Secondary | ICD-10-CM | POA: Diagnosis not present

## 2017-09-15 DIAGNOSIS — J441 Chronic obstructive pulmonary disease with (acute) exacerbation: Secondary | ICD-10-CM | POA: Diagnosis not present

## 2017-09-15 DIAGNOSIS — J440 Chronic obstructive pulmonary disease with acute lower respiratory infection: Secondary | ICD-10-CM | POA: Diagnosis not present

## 2017-09-15 DIAGNOSIS — I110 Hypertensive heart disease with heart failure: Secondary | ICD-10-CM | POA: Diagnosis not present

## 2017-09-15 DIAGNOSIS — Z9981 Dependence on supplemental oxygen: Secondary | ICD-10-CM | POA: Diagnosis not present

## 2017-09-15 DIAGNOSIS — I509 Heart failure, unspecified: Secondary | ICD-10-CM | POA: Diagnosis not present

## 2017-09-15 DIAGNOSIS — Z7982 Long term (current) use of aspirin: Secondary | ICD-10-CM | POA: Diagnosis not present

## 2017-09-15 DIAGNOSIS — I252 Old myocardial infarction: Secondary | ICD-10-CM | POA: Diagnosis not present

## 2017-09-15 DIAGNOSIS — Z87891 Personal history of nicotine dependence: Secondary | ICD-10-CM | POA: Diagnosis not present

## 2017-09-15 DIAGNOSIS — I4891 Unspecified atrial fibrillation: Secondary | ICD-10-CM | POA: Diagnosis not present

## 2017-09-15 DIAGNOSIS — K219 Gastro-esophageal reflux disease without esophagitis: Secondary | ICD-10-CM | POA: Diagnosis not present

## 2017-09-15 DIAGNOSIS — J209 Acute bronchitis, unspecified: Secondary | ICD-10-CM | POA: Diagnosis not present

## 2017-09-18 DIAGNOSIS — J439 Emphysema, unspecified: Secondary | ICD-10-CM | POA: Diagnosis not present

## 2017-09-18 DIAGNOSIS — J449 Chronic obstructive pulmonary disease, unspecified: Secondary | ICD-10-CM | POA: Diagnosis not present

## 2017-09-19 DIAGNOSIS — K219 Gastro-esophageal reflux disease without esophagitis: Secondary | ICD-10-CM | POA: Diagnosis not present

## 2017-09-19 DIAGNOSIS — I4891 Unspecified atrial fibrillation: Secondary | ICD-10-CM | POA: Diagnosis not present

## 2017-09-19 DIAGNOSIS — J209 Acute bronchitis, unspecified: Secondary | ICD-10-CM | POA: Diagnosis not present

## 2017-09-19 DIAGNOSIS — J441 Chronic obstructive pulmonary disease with (acute) exacerbation: Secondary | ICD-10-CM | POA: Diagnosis not present

## 2017-09-19 DIAGNOSIS — I509 Heart failure, unspecified: Secondary | ICD-10-CM | POA: Diagnosis not present

## 2017-09-19 DIAGNOSIS — Z9981 Dependence on supplemental oxygen: Secondary | ICD-10-CM | POA: Diagnosis not present

## 2017-09-19 DIAGNOSIS — J440 Chronic obstructive pulmonary disease with acute lower respiratory infection: Secondary | ICD-10-CM | POA: Diagnosis not present

## 2017-09-19 DIAGNOSIS — I252 Old myocardial infarction: Secondary | ICD-10-CM | POA: Diagnosis not present

## 2017-09-19 DIAGNOSIS — Z87891 Personal history of nicotine dependence: Secondary | ICD-10-CM | POA: Diagnosis not present

## 2017-09-19 DIAGNOSIS — Z7982 Long term (current) use of aspirin: Secondary | ICD-10-CM | POA: Diagnosis not present

## 2017-09-19 DIAGNOSIS — I110 Hypertensive heart disease with heart failure: Secondary | ICD-10-CM | POA: Diagnosis not present

## 2017-09-19 DIAGNOSIS — I2510 Atherosclerotic heart disease of native coronary artery without angina pectoris: Secondary | ICD-10-CM | POA: Diagnosis not present

## 2017-09-21 DIAGNOSIS — Z9981 Dependence on supplemental oxygen: Secondary | ICD-10-CM | POA: Diagnosis not present

## 2017-09-21 DIAGNOSIS — I2510 Atherosclerotic heart disease of native coronary artery without angina pectoris: Secondary | ICD-10-CM | POA: Diagnosis not present

## 2017-09-21 DIAGNOSIS — J441 Chronic obstructive pulmonary disease with (acute) exacerbation: Secondary | ICD-10-CM | POA: Diagnosis not present

## 2017-09-21 DIAGNOSIS — Z7982 Long term (current) use of aspirin: Secondary | ICD-10-CM | POA: Diagnosis not present

## 2017-09-21 DIAGNOSIS — I4891 Unspecified atrial fibrillation: Secondary | ICD-10-CM | POA: Diagnosis not present

## 2017-09-21 DIAGNOSIS — Z87891 Personal history of nicotine dependence: Secondary | ICD-10-CM | POA: Diagnosis not present

## 2017-09-21 DIAGNOSIS — I509 Heart failure, unspecified: Secondary | ICD-10-CM | POA: Diagnosis not present

## 2017-09-21 DIAGNOSIS — I110 Hypertensive heart disease with heart failure: Secondary | ICD-10-CM | POA: Diagnosis not present

## 2017-09-21 DIAGNOSIS — J440 Chronic obstructive pulmonary disease with acute lower respiratory infection: Secondary | ICD-10-CM | POA: Diagnosis not present

## 2017-09-21 DIAGNOSIS — I252 Old myocardial infarction: Secondary | ICD-10-CM | POA: Diagnosis not present

## 2017-09-21 DIAGNOSIS — K219 Gastro-esophageal reflux disease without esophagitis: Secondary | ICD-10-CM | POA: Diagnosis not present

## 2017-09-21 DIAGNOSIS — J209 Acute bronchitis, unspecified: Secondary | ICD-10-CM | POA: Diagnosis not present

## 2017-09-27 DIAGNOSIS — Z9981 Dependence on supplemental oxygen: Secondary | ICD-10-CM | POA: Diagnosis not present

## 2017-09-27 DIAGNOSIS — J209 Acute bronchitis, unspecified: Secondary | ICD-10-CM | POA: Diagnosis not present

## 2017-09-27 DIAGNOSIS — K219 Gastro-esophageal reflux disease without esophagitis: Secondary | ICD-10-CM | POA: Diagnosis not present

## 2017-09-27 DIAGNOSIS — Z7982 Long term (current) use of aspirin: Secondary | ICD-10-CM | POA: Diagnosis not present

## 2017-09-27 DIAGNOSIS — I509 Heart failure, unspecified: Secondary | ICD-10-CM | POA: Diagnosis not present

## 2017-09-27 DIAGNOSIS — I252 Old myocardial infarction: Secondary | ICD-10-CM | POA: Diagnosis not present

## 2017-09-27 DIAGNOSIS — J440 Chronic obstructive pulmonary disease with acute lower respiratory infection: Secondary | ICD-10-CM | POA: Diagnosis not present

## 2017-09-27 DIAGNOSIS — Z87891 Personal history of nicotine dependence: Secondary | ICD-10-CM | POA: Diagnosis not present

## 2017-09-27 DIAGNOSIS — J441 Chronic obstructive pulmonary disease with (acute) exacerbation: Secondary | ICD-10-CM | POA: Diagnosis not present

## 2017-09-27 DIAGNOSIS — I2510 Atherosclerotic heart disease of native coronary artery without angina pectoris: Secondary | ICD-10-CM | POA: Diagnosis not present

## 2017-09-27 DIAGNOSIS — I110 Hypertensive heart disease with heart failure: Secondary | ICD-10-CM | POA: Diagnosis not present

## 2017-09-27 DIAGNOSIS — I4891 Unspecified atrial fibrillation: Secondary | ICD-10-CM | POA: Diagnosis not present

## 2017-10-02 DIAGNOSIS — Z7982 Long term (current) use of aspirin: Secondary | ICD-10-CM | POA: Diagnosis not present

## 2017-10-02 DIAGNOSIS — K219 Gastro-esophageal reflux disease without esophagitis: Secondary | ICD-10-CM | POA: Diagnosis not present

## 2017-10-02 DIAGNOSIS — I252 Old myocardial infarction: Secondary | ICD-10-CM | POA: Diagnosis not present

## 2017-10-02 DIAGNOSIS — J440 Chronic obstructive pulmonary disease with acute lower respiratory infection: Secondary | ICD-10-CM | POA: Diagnosis not present

## 2017-10-02 DIAGNOSIS — Z9981 Dependence on supplemental oxygen: Secondary | ICD-10-CM | POA: Diagnosis not present

## 2017-10-02 DIAGNOSIS — I2510 Atherosclerotic heart disease of native coronary artery without angina pectoris: Secondary | ICD-10-CM | POA: Diagnosis not present

## 2017-10-02 DIAGNOSIS — I110 Hypertensive heart disease with heart failure: Secondary | ICD-10-CM | POA: Diagnosis not present

## 2017-10-02 DIAGNOSIS — J441 Chronic obstructive pulmonary disease with (acute) exacerbation: Secondary | ICD-10-CM | POA: Diagnosis not present

## 2017-10-02 DIAGNOSIS — J209 Acute bronchitis, unspecified: Secondary | ICD-10-CM | POA: Diagnosis not present

## 2017-10-02 DIAGNOSIS — I509 Heart failure, unspecified: Secondary | ICD-10-CM | POA: Diagnosis not present

## 2017-10-02 DIAGNOSIS — Z87891 Personal history of nicotine dependence: Secondary | ICD-10-CM | POA: Diagnosis not present

## 2017-10-02 DIAGNOSIS — I4891 Unspecified atrial fibrillation: Secondary | ICD-10-CM | POA: Diagnosis not present

## 2017-10-11 DIAGNOSIS — Z9981 Dependence on supplemental oxygen: Secondary | ICD-10-CM | POA: Diagnosis not present

## 2017-10-11 DIAGNOSIS — J441 Chronic obstructive pulmonary disease with (acute) exacerbation: Secondary | ICD-10-CM | POA: Diagnosis not present

## 2017-10-11 DIAGNOSIS — I2510 Atherosclerotic heart disease of native coronary artery without angina pectoris: Secondary | ICD-10-CM | POA: Diagnosis not present

## 2017-10-11 DIAGNOSIS — I509 Heart failure, unspecified: Secondary | ICD-10-CM | POA: Diagnosis not present

## 2017-10-11 DIAGNOSIS — Z7982 Long term (current) use of aspirin: Secondary | ICD-10-CM | POA: Diagnosis not present

## 2017-10-11 DIAGNOSIS — K219 Gastro-esophageal reflux disease without esophagitis: Secondary | ICD-10-CM | POA: Diagnosis not present

## 2017-10-11 DIAGNOSIS — I252 Old myocardial infarction: Secondary | ICD-10-CM | POA: Diagnosis not present

## 2017-10-11 DIAGNOSIS — J209 Acute bronchitis, unspecified: Secondary | ICD-10-CM | POA: Diagnosis not present

## 2017-10-11 DIAGNOSIS — Z87891 Personal history of nicotine dependence: Secondary | ICD-10-CM | POA: Diagnosis not present

## 2017-10-11 DIAGNOSIS — I110 Hypertensive heart disease with heart failure: Secondary | ICD-10-CM | POA: Diagnosis not present

## 2017-10-11 DIAGNOSIS — I4891 Unspecified atrial fibrillation: Secondary | ICD-10-CM | POA: Diagnosis not present

## 2017-10-11 DIAGNOSIS — J440 Chronic obstructive pulmonary disease with acute lower respiratory infection: Secondary | ICD-10-CM | POA: Diagnosis not present

## 2017-10-16 DIAGNOSIS — J449 Chronic obstructive pulmonary disease, unspecified: Secondary | ICD-10-CM | POA: Diagnosis not present

## 2017-10-16 DIAGNOSIS — J439 Emphysema, unspecified: Secondary | ICD-10-CM | POA: Diagnosis not present

## 2017-10-18 DIAGNOSIS — I2510 Atherosclerotic heart disease of native coronary artery without angina pectoris: Secondary | ICD-10-CM | POA: Diagnosis not present

## 2017-10-18 DIAGNOSIS — Z87891 Personal history of nicotine dependence: Secondary | ICD-10-CM | POA: Diagnosis not present

## 2017-10-18 DIAGNOSIS — I509 Heart failure, unspecified: Secondary | ICD-10-CM | POA: Diagnosis not present

## 2017-10-18 DIAGNOSIS — Z9981 Dependence on supplemental oxygen: Secondary | ICD-10-CM | POA: Diagnosis not present

## 2017-10-18 DIAGNOSIS — I110 Hypertensive heart disease with heart failure: Secondary | ICD-10-CM | POA: Diagnosis not present

## 2017-10-18 DIAGNOSIS — Z7982 Long term (current) use of aspirin: Secondary | ICD-10-CM | POA: Diagnosis not present

## 2017-10-18 DIAGNOSIS — I252 Old myocardial infarction: Secondary | ICD-10-CM | POA: Diagnosis not present

## 2017-10-18 DIAGNOSIS — I4891 Unspecified atrial fibrillation: Secondary | ICD-10-CM | POA: Diagnosis not present

## 2017-10-18 DIAGNOSIS — J441 Chronic obstructive pulmonary disease with (acute) exacerbation: Secondary | ICD-10-CM | POA: Diagnosis not present

## 2017-10-18 DIAGNOSIS — J209 Acute bronchitis, unspecified: Secondary | ICD-10-CM | POA: Diagnosis not present

## 2017-10-18 DIAGNOSIS — K219 Gastro-esophageal reflux disease without esophagitis: Secondary | ICD-10-CM | POA: Diagnosis not present

## 2017-10-18 DIAGNOSIS — J440 Chronic obstructive pulmonary disease with acute lower respiratory infection: Secondary | ICD-10-CM | POA: Diagnosis not present

## 2017-11-03 ENCOUNTER — Telehealth: Payer: Self-pay

## 2017-11-03 NOTE — Telephone Encounter (Signed)
CMN is signed and put in folder. 

## 2017-11-16 DIAGNOSIS — J449 Chronic obstructive pulmonary disease, unspecified: Secondary | ICD-10-CM | POA: Diagnosis not present

## 2017-11-16 DIAGNOSIS — J439 Emphysema, unspecified: Secondary | ICD-10-CM | POA: Diagnosis not present

## 2017-12-16 DIAGNOSIS — J439 Emphysema, unspecified: Secondary | ICD-10-CM | POA: Diagnosis not present

## 2017-12-16 DIAGNOSIS — J449 Chronic obstructive pulmonary disease, unspecified: Secondary | ICD-10-CM | POA: Diagnosis not present

## 2018-01-11 ENCOUNTER — Ambulatory Visit: Payer: Self-pay | Admitting: Internal Medicine

## 2018-01-16 DIAGNOSIS — J439 Emphysema, unspecified: Secondary | ICD-10-CM | POA: Diagnosis not present

## 2018-01-16 DIAGNOSIS — J449 Chronic obstructive pulmonary disease, unspecified: Secondary | ICD-10-CM | POA: Diagnosis not present

## 2018-01-18 ENCOUNTER — Ambulatory Visit: Payer: Self-pay | Admitting: Internal Medicine

## 2018-02-15 DIAGNOSIS — J439 Emphysema, unspecified: Secondary | ICD-10-CM | POA: Diagnosis not present

## 2018-02-15 DIAGNOSIS — J449 Chronic obstructive pulmonary disease, unspecified: Secondary | ICD-10-CM | POA: Diagnosis not present

## 2018-02-22 ENCOUNTER — Telehealth: Payer: Self-pay

## 2018-02-22 NOTE — Telephone Encounter (Signed)
COLOGUARD # ORDER [id number] WAS EXPIRE BECAUSE IT HAS EXCEEDED 365 DAYS FROM THE INITIAL ORDER.

## 2018-02-26 ENCOUNTER — Encounter: Payer: Self-pay | Admitting: Internal Medicine

## 2018-02-26 ENCOUNTER — Ambulatory Visit: Payer: Medicare Other | Admitting: Internal Medicine

## 2018-02-26 VITALS — BP 186/62 | HR 84 | Resp 22 | Ht 70.0 in | Wt 255.0 lb

## 2018-02-26 DIAGNOSIS — I482 Chronic atrial fibrillation, unspecified: Secondary | ICD-10-CM

## 2018-02-26 DIAGNOSIS — J9611 Chronic respiratory failure with hypoxia: Secondary | ICD-10-CM

## 2018-02-26 DIAGNOSIS — J449 Chronic obstructive pulmonary disease, unspecified: Secondary | ICD-10-CM | POA: Diagnosis not present

## 2018-02-26 DIAGNOSIS — R0902 Hypoxemia: Secondary | ICD-10-CM | POA: Diagnosis not present

## 2018-02-26 DIAGNOSIS — R0602 Shortness of breath: Secondary | ICD-10-CM

## 2018-02-26 MED ORDER — IPRATROPIUM-ALBUTEROL 0.5-2.5 (3) MG/3ML IN SOLN: 3.0000 mL | Freq: Four times a day (QID) | RESPIRATORY_TRACT | 6 refills | Status: DC

## 2018-02-26 NOTE — Progress Notes (Signed)
[facility] [address] [place], [place] [address]  Pulmonary Sleep Medicine   Office Visit Note  Patient Name: Erik [name] DOB: December 05, 1955 MRN [id number]  Date of Service: 02/26/2018  Complaints/HPI: Pt here for follow up COPD,and  Chronic Resp failure.  He denies recent admission to hospital.  He has been using the Symbicort inhaler as directed, as well as his nebulizer.  He denies using other medications.  Requesting medication refills which was taken care of today.  Patient denies having any cough no congestion.  No chest pain no palpitations.  Medications do seem to help with his breathing symptoms  ROS  General: (-) fever, (-) chills, (-) night sweats, (-) weakness Skin: (-) rashes, (-) itching,. Eyes: (-) visual changes, (-) redness, (-) itching. Nose and Sinuses: (-) nasal stuffiness or itchiness, (-) postnasal drip, (-) nosebleeds, (-) sinus trouble. Mouth and Throat: (-) sore throat, (-) hoarseness. Neck: (-) swollen glands, (-) enlarged thyroid, (-) neck pain. Respiratory: - cough, (-) bloody sputum, + shortness of breath, - wheezing. Cardiovascular: - ankle swelling, (-) chest pain. Lymphatic: (-) lymph node enlargement. Neurologic: (-) numbness, (-) tingling. Psychiatric: (-) anxiety, (-) depression   Current Medication: Outpatient Encounter Medications as of 02/26/2018  Medication Sig  . albuterol-ipratropium (COMBIVENT) 18-103 MCG/ACT inhaler Inhale 2 puffs into the lungs every 4 (four) hours.  [name] aspirin 325 MG tablet Take 325 mg by mouth daily.  [name] diltiazem (CARDIZEM CD) 180 MG 24 hr capsule Take 1 capsule (180 mg total) by mouth daily.  [name] diltiazem (CARTIA XT) 120 MG 24 hr capsule TAKE ONE CAPSULE BY MOUTH EVERY DAY  . furosemide (LASIX) 20 MG tablet Take 20 mg by mouth daily.  [name] ipratropium-albuterol (DUONEB) 0.5-2.5 (3) MG/3ML SOLN Take 3 mLs by nebulization every 6 (six) hours.  . isosorbide mononitrate (IMDUR) 30 MG 24 hr tablet Take 1  tablet by mouth daily. Pt states he takes 15mg   . loratadine (CLARITIN) 10 MG tablet Take 10 mg by mouth daily.  . mometasone-formoterol (DULERA) 200-5 MCG/ACT AERO Inhale 2 Inhalers into the lungs 2 (two) times daily.  . OXYGEN Inhale into the lungs.  . predniSONE (STERAPRED UNI-PAK 21 TAB) 10 MG (21) TBPK tablet Taper by 10 mg daily   No facility-administered encounter medications on file as of 02/26/2018.     Surgical History: Past Surgical History:  Procedure Laterality Date  . ANGIOPLASTY  2009  . CARDIAC CATHETERIZATION N/A 03/18/2015   Procedure: Left Heart Cath with Coronary Angiography;  Surgeon: Lamar Blinks, MD;  Location: [facility] INVASIVE CV LAB;  Service: Cardiovascular;  Laterality: N/A;  . CHOLECYSTECTOMY  2005  . CORONARY ANGIOGRAM  2009    Medical History: Past Medical History:  Diagnosis Date  . Arthritis   . Asthma   . Atrial fibrillation (HCC)   . CHF (congestive heart failure) (HCC)   . COPD (chronic obstructive pulmonary disease) (HCC)   . Coronary artery disease   . GERD (gastroesophageal reflux disease)   . Hypertension   . Myocardial infarction (HCC)   . Shortness of breath dyspnea     Family History: Family History  Problem Relation Age of Onset  . Coronary artery disease Father   . Diabetes Father   . Hyperlipidemia Father   . Hypertension Father     Social History: Social History   Socioeconomic History  . Marital status: Married    Spouse name: Not on file  . Number of children: Not on file  .  Years of education: Not on file  . Highest education level: Not on file  Occupational History  . Not on file  Social Needs  . Financial resource strain: Not on file  . Food insecurity:    Worry: Not on file    Inability: Not on file  . Transportation needs:    Medical: Not on file    Non-medical: Not on file  Tobacco Use  . Smoking status: Former Smoker    Years: 25.00    Last attempt to quit: 03/20/1997    Years since quitting: 20.9   . Smokeless tobacco: Never Used  Substance and Sexual Activity  . Alcohol use: No  . Drug use: No  . Sexual activity: Not on file  Lifestyle  . Physical activity:    Days per week: Not on file    Minutes per session: Not on file  . Stress: Not on file  Relationships  . Social connections:    Talks on phone: Not on file    Gets together: Not on file    Attends religious service: Not on file    Active member of club or organization: Not on file    Attends meetings of clubs or organizations: Not on file    Relationship status: Not on file  . Intimate partner violence:    Fear of current or ex partner: Not on file    Emotionally abused: Not on file    Physically abused: Not on file    Forced sexual activity: Not on file  Other Topics Concern  . Not on file  Social History Narrative  . Not on file    Vital Signs: Blood pressure (!) 186/62, pulse 84, resp. rate (!) 22, height 5\' 10"  (1.778 m), weight 255 lb (115.7 kg), SpO2 97 %.  Examination: General Appearance: The patient is well-developed, well-nourished, and in no distress. Skin: Gross inspection of skin unremarkable. Head: normocephalic, no gross deformities. Eyes: no gross deformities noted. ENT: ears appear grossly normal no exudates. Neck: Supple. No thyromegaly. No LAD. Respiratory: Diminished breath sounds in all fields. Cardiovascular: Normal S1 and S2 without murmur or rub. Extremities: No cyanosis. pulses are equal. Neurologic: Alert and oriented. No involuntary movements.  LABS: No results found for this or any previous visit (from the past 2160 hour(s)).  Radiology: No results found.  No results found.  No results found.    Assessment and Plan: Patient Active Problem List   Diagnosis Date Noted  . Dorsalgia 09/11/2017  . Occlusion and stenosis of bilateral carotid arteries 09/11/2017  . Pulmonic heart disease (HCC) 09/11/2017  . COPD with hypoxia (HCC) 08/14/2017  . Chronic a-fib (HCC)  03/27/2015  . CAD (coronary artery disease) 03/02/2015  . 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  . Essential hypertension with goal blood pressure less than 130/80 02/23/2015   1. COPD with hypoxia (HCC) Continue to use Spiriva and duonebs as discussed.  Another Sample of Spiriva given.   - ipratropium-albuterol (DUONEB) 0.5-2.5 (3) MG/3ML SOLN; Take 3 mLs by nebulization every 6 (six) hours.  Dispense: 360 mL; Refill: 6  2. Chronic respiratory failure with hypoxia (HCC) On 2 LPM continuous.  Continued order at this time.     3. SOB (shortness of breath) - Spirometry with Graph  4. Chronic a-fib (HCC) Rate controlled. Continue Cardiology mgmt.   General Counseling: I have discussed the findings of the evaluation and examination with Erik [name].  I have also discussed any further diagnostic evaluation thatmay be needed or ordered today.  Erik [name] verbalizes understanding of the findings of todays visit. We also reviewed his medications today and discussed drug interactions and side effects including but not limited excessive drowsiness and altered mental states. We also discussed that there is always a risk not just to him but also people around him. he has been encouraged to call the office with any questions or concerns that should arise related to todays visit.    Time spent: 25 This patient was seen by Blima Ledger AGNP-C in Collaboration with Dr. Freda Munro as a part of collaborative care agreement  I have personally obtained a history, examined the patient, evaluated laboratory and imaging results, formulated the assessment and plan and placed orders.    Yevonne Pax, MD [facility] Pulmonary and Critical Care Sleep medicine

## 2018-02-26 NOTE — Patient Instructions (Signed)

## 2018-02-28 ENCOUNTER — Ambulatory Visit: Payer: Self-pay | Admitting: Adult Health

## 2018-03-01 ENCOUNTER — Encounter: Payer: Self-pay | Admitting: Internal Medicine

## 2018-03-05 ENCOUNTER — Ambulatory Visit (INDEPENDENT_AMBULATORY_CARE_PROVIDER_SITE_OTHER): Payer: Medicare Other | Admitting: Adult Health

## 2018-03-05 ENCOUNTER — Encounter: Payer: Self-pay | Admitting: Adult Health

## 2018-03-05 VITALS — BP 166/89 | HR 76 | Resp 22 | Ht 70.0 in | Wt 256.0 lb

## 2018-03-05 DIAGNOSIS — Z1159 Encounter for screening for other viral diseases: Secondary | ICD-10-CM

## 2018-03-05 DIAGNOSIS — E782 Mixed hyperlipidemia: Secondary | ICD-10-CM

## 2018-03-05 DIAGNOSIS — I482 Chronic atrial fibrillation, unspecified: Secondary | ICD-10-CM

## 2018-03-05 DIAGNOSIS — Z0001 Encounter for general adult medical examination with abnormal findings: Secondary | ICD-10-CM | POA: Diagnosis not present

## 2018-03-05 DIAGNOSIS — R300 Dysuria: Secondary | ICD-10-CM | POA: Diagnosis not present

## 2018-03-05 DIAGNOSIS — J9611 Chronic respiratory failure with hypoxia: Secondary | ICD-10-CM | POA: Diagnosis not present

## 2018-03-05 DIAGNOSIS — J449 Chronic obstructive pulmonary disease, unspecified: Secondary | ICD-10-CM | POA: Diagnosis not present

## 2018-03-05 DIAGNOSIS — R0902 Hypoxemia: Secondary | ICD-10-CM

## 2018-03-05 DIAGNOSIS — R0602 Shortness of breath: Secondary | ICD-10-CM | POA: Diagnosis not present

## 2018-03-05 DIAGNOSIS — W57XXXS Bitten or stung by nonvenomous insect and other nonvenomous arthropods, sequela: Secondary | ICD-10-CM

## 2018-03-05 NOTE — Patient Instructions (Signed)

## 2018-03-05 NOTE — Progress Notes (Signed)
[facility] [address] [place], [place] [id number]  Internal MEDICINE  Office Visit Note  Patient Name: Erik [name]  [id number]  [id number]  Date of Service: 03/05/2018  Chief Complaint  Patient presents with  . Annual Exam    medicare annual exam   . COPD  . Cough  . Hypertension  . Gastroesophageal Reflux     HPI Pt is here for routine health maintenance examination.  He appears at his baseline.  He is on oxygen 24 hours daily.  He current uses 3 LPM.  He also sleeps with the oxygen.   Current Medication: Outpatient Encounter Medications as of 03/05/2018  Medication Sig  . albuterol-ipratropium (COMBIVENT) 18-103 MCG/ACT inhaler Inhale 2 puffs into the lungs every 4 (four) hours.  [name]. aspirin 325 MG tablet Take 325 mg by mouth daily.  [name]. diltiazem (CARDIZEM CD) 180 MG 24 hr capsule Take 1 capsule (180 mg total) by mouth daily.  [name]. diltiazem (CARTIA XT) 120 MG 24 hr capsule TAKE ONE CAPSULE BY MOUTH EVERY DAY  . furosemide (LASIX) 20 MG tablet Take 20 mg by mouth daily.  [name]. ipratropium-albuterol (DUONEB) 0.5-2.5 (3) MG/3ML SOLN Take 3 mLs by nebulization every 6 (six) hours.  . isosorbide mononitrate (IMDUR) 30 MG 24 hr tablet Take 1 tablet by mouth daily. Pt states he takes 15mg   . loratadine (CLARITIN) 10 MG tablet Take 10 mg by mouth daily.  . mometasone-formoterol (DULERA) 200-5 MCG/ACT AERO Inhale 2 Inhalers into the lungs 2 (two) times daily.  . OXYGEN Inhale into the lungs.  . predniSONE (STERAPRED UNI-PAK 21 TAB) 10 MG (21) TBPK tablet Taper by 10 mg daily   No facility-administered encounter medications on file as of 03/05/2018.     Surgical History: Past Surgical History:  Procedure Laterality Date  . ANGIOPLASTY  2009  . CARDIAC CATHETERIZATION N/A 03/18/2015   Procedure: Left Heart Cath with Coronary Angiography;  Surgeon: Lamar BlinksBruce J Kowalski, MD;  Location: [facility] INVASIVE CV LAB;  Service: Cardiovascular;  Laterality: N/A;  . CHOLECYSTECTOMY  2005   . CORONARY ANGIOGRAM  2009    Medical History: Past Medical History:  Diagnosis Date  . Arthritis   . Asthma   . Atrial fibrillation (HCC)   . CHF (congestive heart failure) (HCC)   . COPD (chronic obstructive pulmonary disease) (HCC)   . Coronary artery disease   . GERD (gastroesophageal reflux disease)   . Hypertension   . Myocardial infarction (HCC)   . Shortness of breath dyspnea     Family History: Family History  Problem Relation Age of Onset  . Coronary artery disease Father   . Diabetes Father   . Hyperlipidemia Father   . Hypertension Father       Review of Systems  Constitutional: Negative.  Negative for chills, fatigue and unexpected weight change.  HENT: Negative.  Negative for congestion, rhinorrhea, sneezing and sore throat.   Eyes: Negative for redness.  Respiratory: Negative.  Negative for cough, chest tightness and shortness of breath.   Cardiovascular: Negative.  Negative for chest pain and palpitations.  Gastrointestinal: Negative.  Negative for abdominal pain, constipation, diarrhea, nausea and vomiting.  Endocrine: Negative.   Genitourinary: Negative.  Negative for dysuria and frequency.  Musculoskeletal: Negative.  Negative for arthralgias, back pain, joint swelling and neck pain.  Skin: Negative.  Negative for rash.  Allergic/Immunologic: Negative.   Neurological: Negative.  Negative for tremors and numbness.  Hematological: Negative for adenopathy. Does not bruise/bleed easily.  Psychiatric/Behavioral:  Negative.  Negative for behavioral problems, sleep disturbance and suicidal ideas. The patient is not nervous/anxious.      Vital Signs: BP (!) 166/89   Pulse 76   Resp (!) 22   Ht 5\' 10"  (1.778 m)   Wt 256 lb (116.1 kg)   SpO2 95%   BMI 36.73 kg/m    Physical Exam  Constitutional: He is oriented to person, place, and time. He appears well-developed and well-nourished. No distress.  HENT:  Head: Normocephalic and atraumatic.   Mouth/Throat: Oropharynx is clear and moist. No oropharyngeal exudate.  Eyes: Pupils are equal, round, and reactive to light. EOM are normal.  Neck: Normal range of motion. Neck supple. No JVD present. No tracheal deviation present. No thyromegaly present.  Cardiovascular: Normal rate, regular rhythm and normal heart sounds. Exam reveals no gallop and no friction rub.  No murmur heard. Pulmonary/Chest: No respiratory distress. He has no wheezes. He has no rales. He exhibits no tenderness.  Labored Resp. Decreased breath sounds in all fields.  Abdominal: Soft. There is no tenderness. There is no guarding.  Musculoskeletal: Normal range of motion. He exhibits edema.  BLE edema  Lymphadenopathy:    He has no cervical adenopathy.  Neurological: He is alert and oriented to person, place, and time. No cranial nerve deficit.  Skin: Skin is warm and dry. He is not diaphoretic.  Psychiatric: He has a normal mood and affect. His behavior is normal. Judgment and thought content normal.  Nursing note and vitals reviewed.  LABS: No results found for this or any previous visit (from the past 2160 hour(s)).  Assessment/Plan: 1. Encounter for general adult medical examination with abnormal findings - CBC with Differential/Platelet - Lipid Panel With LDL/HDL Ratio - TSH - T4, free - Comprehensive metabolic panel - PSA  2. COPD with hypoxia (HCC) Continue current therapy.  Continue to use oxygen continuously   3. Chronic a-fib (HCC) Stable, continue current thearpy  4. Chronic respiratory failure with hypoxia (HCC) On chronic Oxygen at home and with sleeping.   5. SOB (shortness of breath) Pt on chronic oxygen.  Denies increased SOB.   6. Combined hyperlipidemia Continue current therapy, will review labs.  7. Dysuria - UA/M w/rflx Culture, Routine  8. Encounter for hepatitis C screening test for low risk patient - Hepatitis c antibody (reflex)  9. Tick bite, sequela - Lyme Disease,  IgM, Early Test w/ Rflx - [place] spotted fvr ab, IgG-blood  General Counseling: Parvin verbalizes understanding of the findings of todays visit and agrees with plan of treatment. I have discussed any further diagnostic evaluation that may be needed or ordered today. We also reviewed his medications today. he has been encouraged to call the office with any questions or concerns that should arise related to todays visit.   Orders Placed This Encounter  Procedures  . UA/M w/rflx Culture, Routine    No orders of the defined types were placed in this encounter.   Time spent: 25 Minutes   This patient was seen by Blima Ledger AGNP-C in Collaboration with Dr Lyndon Code as a part of collaborative care agreement   Lyndon Code, MD  Internal Medicine

## 2018-03-06 LAB — UA/M W/RFLX CULTURE, ROUTINE
BILIRUBIN UA: NEGATIVE
Glucose, UA: NEGATIVE
Ketones, UA: NEGATIVE
LEUKOCYTES UA: NEGATIVE
Nitrite, UA: NEGATIVE
Protein, UA: NEGATIVE
RBC, UA: NEGATIVE
SPEC GRAV UA: 1.014 (ref 1.005–1.030)
Urobilinogen, Ur: 0.2 mg/dL (ref 0.2–1.0)
pH, UA: 5 (ref 5.0–7.5)

## 2018-03-06 LAB — MICROSCOPIC EXAMINATION
BACTERIA UA: NONE SEEN
CASTS: NONE SEEN /LPF

## 2018-03-12 ENCOUNTER — Encounter: Payer: Self-pay | Admitting: Adult Health

## 2018-03-18 DIAGNOSIS — J439 Emphysema, unspecified: Secondary | ICD-10-CM | POA: Diagnosis not present

## 2018-03-18 DIAGNOSIS — J449 Chronic obstructive pulmonary disease, unspecified: Secondary | ICD-10-CM | POA: Diagnosis not present

## 2018-04-06 ENCOUNTER — Ambulatory Visit: Payer: Self-pay | Admitting: Adult Health

## 2018-04-18 DIAGNOSIS — J439 Emphysema, unspecified: Secondary | ICD-10-CM | POA: Diagnosis not present

## 2018-04-18 DIAGNOSIS — J449 Chronic obstructive pulmonary disease, unspecified: Secondary | ICD-10-CM | POA: Diagnosis not present

## 2018-05-18 DIAGNOSIS — J449 Chronic obstructive pulmonary disease, unspecified: Secondary | ICD-10-CM | POA: Diagnosis not present

## 2018-05-18 DIAGNOSIS — J439 Emphysema, unspecified: Secondary | ICD-10-CM | POA: Diagnosis not present

## 2018-06-15 ENCOUNTER — Ambulatory Visit (INDEPENDENT_AMBULATORY_CARE_PROVIDER_SITE_OTHER): Payer: Medicare Other | Admitting: Adult Health

## 2018-06-15 ENCOUNTER — Encounter: Payer: Self-pay | Admitting: Adult Health

## 2018-06-15 VITALS — BP 152/110 | HR 58 | Temp 95.0°F | Resp 22 | Ht 70.0 in | Wt 250.0 lb

## 2018-06-15 DIAGNOSIS — J22 Unspecified acute lower respiratory infection: Secondary | ICD-10-CM | POA: Diagnosis not present

## 2018-06-15 DIAGNOSIS — J9611 Chronic respiratory failure with hypoxia: Secondary | ICD-10-CM | POA: Diagnosis not present

## 2018-06-15 DIAGNOSIS — R0602 Shortness of breath: Secondary | ICD-10-CM

## 2018-06-15 DIAGNOSIS — R6889 Other general symptoms and signs: Secondary | ICD-10-CM | POA: Diagnosis not present

## 2018-06-15 DIAGNOSIS — I10 Essential (primary) hypertension: Secondary | ICD-10-CM

## 2018-06-15 DIAGNOSIS — Z9981 Dependence on supplemental oxygen: Secondary | ICD-10-CM

## 2018-06-15 LAB — POCT INFLUENZA A/B
INFLUENZA A, POC: NEGATIVE
INFLUENZA B, POC: NEGATIVE

## 2018-06-15 MED ORDER — LEVOFLOXACIN 500 MG PO TABS: 500.0000 mg | ORAL_TABLET | Freq: Every day | ORAL | 0 refills | Status: DC

## 2018-06-15 MED ORDER — PREDNISONE 10 MG PO TABS: ORAL_TABLET | ORAL | 0 refills | Status: DC

## 2018-06-15 NOTE — Progress Notes (Signed)
[facility] [address] [place], [place] [address]  Internal MEDICINE  Office Visit Note  Patient Name: Erik [name]  [id number]  [id number]  Date of Service: 06/15/2018  Chief Complaint  Patient presents with  . Cough  . Sinusitis  . Otalgia     HPI Pt is here for a sick visit.  Patient here reporting cough sinus pain and pressure as well as ear pain.  He reports he is feeling chest congestion.  He is on chronic oxygen at 2 L/min continuously.  He reports he is afraid he may be developing pneumonia.      Current Medication:  Outpatient Encounter Medications as of 06/15/2018  Medication Sig  . albuterol-ipratropium (COMBIVENT) 18-103 MCG/ACT inhaler Inhale 2 puffs into the lungs every 4 (four) hours.  [name] aspirin 325 MG tablet Take 325 mg by mouth daily.  [name] diltiazem (CARDIZEM CD) 180 MG 24 hr capsule Take 1 capsule (180 mg total) by mouth daily.  [name] diltiazem (CARTIA XT) 120 MG 24 hr capsule TAKE ONE CAPSULE BY MOUTH EVERY DAY  . furosemide (LASIX) 20 MG tablet Take 20 mg by mouth daily.  [name] ipratropium-albuterol (DUONEB) 0.5-2.5 (3) MG/3ML SOLN Take 3 mLs by nebulization every 6 (six) hours.  . isosorbide mononitrate (IMDUR) 30 MG 24 hr tablet Take 1 tablet by mouth daily. Pt states he takes 15mg   . levofloxacin (LEVAQUIN) 500 MG tablet Take 1 tablet (500 mg total) by mouth daily.  [name] loratadine (CLARITIN) 10 MG tablet Take 10 mg by mouth daily.  . mometasone-formoterol (DULERA) 200-5 MCG/ACT AERO Inhale 2 Inhalers into the lungs 2 (two) times daily.  . OXYGEN Inhale into the lungs.  . predniSONE (DELTASONE) 10 MG tablet Use per dose pack  . [DISCONTINUED] predniSONE (STERAPRED UNI-PAK 21 TAB) 10 MG (21) TBPK tablet Taper by 10 mg daily (Patient not taking: Reported on 06/15/2018)   No facility-administered encounter medications on file as of 06/15/2018.       Medical History: Past Medical History:  Diagnosis Date  . Arthritis   . Asthma   . Atrial  fibrillation (HCC)   . CHF (congestive heart failure) (HCC)   . COPD (chronic obstructive pulmonary disease) (HCC)   . Coronary artery disease   . GERD (gastroesophageal reflux disease)   . Hypertension   . Myocardial infarction (HCC)   . Shortness of breath dyspnea      Vital Signs: BP (!) 152/110 (BP Location: Left Arm, Patient Position: Sitting, Cuff Size: Normal)   Pulse (!) 58   Temp (!) 95 F (35 C) (Oral)   Resp (!) 22   Ht 5\' 10"  (1.778 m)   Wt 250 lb (113.4 kg)   SpO2 91%   BMI 35.87 kg/m    Review of Systems  Constitutional: Positive for chills and fever. Negative for fatigue and unexpected weight change.  HENT: Positive for ear pain. Negative for congestion, rhinorrhea, sneezing and sore throat.   Eyes: Negative for redness.  Respiratory: Positive for cough, chest tightness and shortness of breath.   Cardiovascular: Negative.  Negative for chest pain and palpitations.  Gastrointestinal: Negative.  Negative for abdominal pain, constipation, diarrhea, nausea and vomiting.  Endocrine: Negative.   Genitourinary: Negative.  Negative for dysuria and frequency.  Musculoskeletal: Negative.  Negative for arthralgias, back pain, joint swelling and neck pain.  Skin: Negative.  Negative for rash.  Allergic/Immunologic: Negative.   Neurological: Negative.  Negative for tremors and numbness.  Hematological: Negative for adenopathy. Does  not bruise/bleed easily.  Psychiatric/Behavioral: Negative.  Negative for behavioral problems, sleep disturbance and suicidal ideas. The patient is not nervous/anxious.     Physical Exam  Constitutional: He is oriented to person, place, and time. He appears well-developed and well-nourished. No distress.  HENT:  Head: Normocephalic and atraumatic.  Right Ear: Hearing, tympanic membrane and ear canal normal.  Left Ear: Hearing, tympanic membrane and ear canal normal.  Mouth/Throat: Oropharynx is clear and moist. No oropharyngeal exudate.   Eyes: Pupils are equal, round, and reactive to light. EOM are normal.  Neck: Normal range of motion. Neck supple. No JVD present. No tracheal deviation present. No thyromegaly present.  Cardiovascular: Normal rate, regular rhythm and normal heart sounds. Exam reveals no gallop and no friction rub.  No murmur heard. Pulmonary/Chest: No respiratory distress. He has no wheezes. He has no rales. He exhibits no tenderness.  Effort: slightly labored Diminished breath sounds in bases.  Abdominal: Soft. There is no tenderness. There is no guarding.  Musculoskeletal: Normal range of motion.  Lymphadenopathy:    He has no cervical adenopathy.  Neurological: He is alert and oriented to person, place, and time. No cranial nerve deficit.  Skin: Skin is warm and dry. He is not diaphoretic.  Psychiatric: He has a normal mood and affect. His behavior is normal. Judgment and thought content normal.  Nursing note and vitals reviewed.  Assessment/Plan: 1. Lower respiratory infection Patient given course of Levaquin for 10 days.  Instructed patient to take medication with food as directed.  Patient also provided with prednisone taper. - levofloxacin (LEVAQUIN) 500 MG tablet; Take 1 tablet (500 mg total) by mouth daily.  Dispense: 10 tablet; Refill: 0 -Prednisone 10 mg tablets; take as a taper dose starting with 6 tablets on day 1. 2. Flu-like symptoms Influenza test negative for a and B. - POCT Influenza A/B  3. Chronic respiratory failure with hypoxia (HCC) Patient has chronic rest tori failure however he reports using his oxygen at 2 L continuously his shortness of breath is minimal.  He does have more with exertion.  He walks using 2 canes instead of a walker.  4. SOB (shortness of breath) Patient has minimal shortness of breath at this time he is more concerned about his chest congestion and it turning into pneumonia.  5. Supplemental oxygen dependent Continue supplemental oxygen at 2 L  continuously.  6. Essential hypertension with goal blood pressure less than 130/80 Patient's blood pressure elevated at 152/110 upon his entrance to the exam room.  It is also of note that his pulse was 58 on arrival and respirations were 22 once the patient sat down I rechecked his blood pressure it was 152/95 and his pulse was 64.  We will continue to monitor his blood pressure at future visits. General Counseling: Erik [name] verbalizes understanding of the findings of todays visit and agrees with plan of treatment. I have discussed any further diagnostic evaluation that may be needed or ordered today. We also reviewed his medications today. he has been encouraged to call the office with any questions or concerns that should arise related to todays visit.   Orders Placed This Encounter  Procedures  . POCT Influenza A/B    Meds ordered this encounter  Medications  . levofloxacin (LEVAQUIN) 500 MG tablet    Sig: Take 1 tablet (500 mg total) by mouth daily.    Dispense:  10 tablet    Refill:  0  . predniSONE (DELTASONE) 10 MG tablet  Sig: Use per dose pack    Dispense:  21 tablet    Refill:  0    Time spent: 25 Minutes  This patient was seen by Blima Ledger AGNP-C in Collaboration with Dr Lyndon Code as a part of collaborative care agreement.  Johnna Acosta AGNP-C Internal Medicine

## 2018-06-15 NOTE — Patient Instructions (Signed)
Upper Respiratory Infection, Adult Most upper respiratory infections (URIs) are caused by a virus. A URI affects the nose, throat, and upper air passages. The most common type of URI is often called "the common cold." Follow these instructions at home:  Take medicines only as told by your doctor.  Gargle warm saltwater or take cough drops to comfort your throat as told by your doctor.  Use a warm mist humidifier or inhale steam from a shower to increase air moisture. This may make it easier to breathe.  Drink enough fluid to keep your pee (urine) clear or pale yellow.  Eat soups and other clear broths.  Have a healthy diet.  Rest as needed.  Go back to work when your fever is gone or your doctor says it is okay. ? You may need to stay home longer to avoid giving your URI to others. ? You can also wear a face mask and wash your hands often to prevent spread of the virus.  Use your inhaler more if you have asthma.  Do not use any tobacco products, including cigarettes, chewing tobacco, or electronic cigarettes. If you need help quitting, ask your doctor. Contact a doctor if:  You are getting worse, not better.  Your symptoms are not helped by medicine.  You have chills.  You are getting more short of breath.  You have brown or red mucus.  You have yellow or brown discharge from your nose.  You have pain in your face, especially when you bend forward.  You have a fever.  You have puffy (swollen) neck glands.  You have pain while swallowing.  You have white areas in the back of your throat. Get help right away if:  You have very bad or constant: ? Headache. ? Ear pain. ? Pain in your forehead, behind your eyes, and over your cheekbones (sinus pain). ? Chest pain.  You have long-lasting (chronic) lung disease and any of the following: ? Wheezing. ? Long-lasting cough. ? Coughing up blood. ? A change in your usual mucus.  You have a stiff neck.  You have  changes in your: ? Vision. ? Hearing. ? Thinking. ? Mood. This information is not intended to replace advice given to you by your health care provider. Make sure you discuss any questions you have with your health care provider. Document Released: 01/11/2008 Document Revised: 03/27/2016 Document Reviewed: 10/30/2013 Elsevier Interactive Patient Education  2018 Elsevier Inc.  

## 2018-06-18 DIAGNOSIS — J439 Emphysema, unspecified: Secondary | ICD-10-CM | POA: Diagnosis not present

## 2018-06-18 DIAGNOSIS — J449 Chronic obstructive pulmonary disease, unspecified: Secondary | ICD-10-CM | POA: Diagnosis not present

## 2018-07-18 DIAGNOSIS — J439 Emphysema, unspecified: Secondary | ICD-10-CM | POA: Diagnosis not present

## 2018-07-18 DIAGNOSIS — J449 Chronic obstructive pulmonary disease, unspecified: Secondary | ICD-10-CM | POA: Diagnosis not present

## 2018-08-18 DIAGNOSIS — J449 Chronic obstructive pulmonary disease, unspecified: Secondary | ICD-10-CM | POA: Diagnosis not present

## 2018-08-18 DIAGNOSIS — J439 Emphysema, unspecified: Secondary | ICD-10-CM | POA: Diagnosis not present

## 2018-08-27 ENCOUNTER — Ambulatory Visit: Payer: Medicare Other | Admitting: Internal Medicine

## 2018-08-27 ENCOUNTER — Encounter: Payer: Self-pay | Admitting: Internal Medicine

## 2018-08-27 VITALS — BP 132/88 | HR 59 | Resp 22 | Ht 70.0 in | Wt 260.0 lb

## 2018-08-27 DIAGNOSIS — R0902 Hypoxemia: Secondary | ICD-10-CM

## 2018-08-27 DIAGNOSIS — R0602 Shortness of breath: Secondary | ICD-10-CM | POA: Diagnosis not present

## 2018-08-27 DIAGNOSIS — I2510 Atherosclerotic heart disease of native coronary artery without angina pectoris: Secondary | ICD-10-CM | POA: Diagnosis not present

## 2018-08-27 DIAGNOSIS — J449 Chronic obstructive pulmonary disease, unspecified: Secondary | ICD-10-CM

## 2018-08-27 DIAGNOSIS — I482 Chronic atrial fibrillation, unspecified: Secondary | ICD-10-CM | POA: Diagnosis not present

## 2018-08-27 DIAGNOSIS — I2583 Coronary atherosclerosis due to lipid rich plaque: Secondary | ICD-10-CM

## 2018-08-27 DIAGNOSIS — J9611 Chronic respiratory failure with hypoxia: Secondary | ICD-10-CM | POA: Diagnosis not present

## 2018-08-27 NOTE — Patient Instructions (Signed)

## 2018-08-27 NOTE — Progress Notes (Signed)
[facility] [address] [place], [place] [address]  Pulmonary Sleep Medicine   Office Visit Note  Patient Name: Erik [name] DOB: 03/16/1956 MRN [id number]  Date of Service: 08/27/2018  Complaints/HPI: COPD Oxygen dependent patient is doing fairly well.  He had his oxygen on today.  Seems to be tolerating it well.  The patient will be he states using it regularly.  Denies having any chest pain no cough no congestion at this time.  Has not had any fevers or chills.  He does have some weakness and he states that he tries to exercise but his shortness of breath obviously limits him  ROS  General: (-) fever, (-) chills, (-) night sweats, (-) weakness Skin: (-) rashes, (-) itching,. Eyes: (-) visual changes, (-) redness, (-) itching. Nose and Sinuses: (-) nasal stuffiness or itchiness, (-) postnasal drip, (-) nosebleeds, (-) sinus trouble. Mouth and Throat: (-) sore throat, (-) hoarseness. Neck: (-) swollen glands, (-) enlarged thyroid, (-) neck pain. Respiratory: + cough, (-) bloody sputum, + shortness of breath, - wheezing. Cardiovascular: - ankle swelling, (-) chest pain. Lymphatic: (-) lymph node enlargement. Neurologic: (-) numbness, (-) tingling. Psychiatric: (-) anxiety, (-) depression   Current Medication: Outpatient Encounter Medications as of 08/27/2018  Medication Sig  . albuterol-ipratropium (COMBIVENT) 18-103 MCG/ACT inhaler Inhale 2 puffs into the lungs every 4 (four) hours.  [name] aspirin 325 MG tablet Take 325 mg by mouth daily.  [name] diltiazem (CARDIZEM CD) 180 MG 24 hr capsule Take 1 capsule (180 mg total) by mouth daily.  [name] diltiazem (CARTIA XT) 120 MG 24 hr capsule TAKE ONE CAPSULE BY MOUTH EVERY DAY  . furosemide (LASIX) 20 MG tablet Take 20 mg by mouth daily.  [name] ipratropium-albuterol (DUONEB) 0.5-2.5 (3) MG/3ML SOLN Take 3 mLs by nebulization every 6 (six) hours.  . isosorbide mononitrate (IMDUR) 30 MG 24 hr tablet Take 1 tablet by mouth daily. Pt  states he takes 15mg   . levofloxacin (LEVAQUIN) 500 MG tablet Take 1 tablet (500 mg total) by mouth daily.  [name] loratadine (CLARITIN) 10 MG tablet Take 10 mg by mouth daily.  . mometasone-formoterol (DULERA) 200-5 MCG/ACT AERO Inhale 2 Inhalers into the lungs 2 (two) times daily.  . OXYGEN Inhale into the lungs.  . predniSONE (DELTASONE) 10 MG tablet Use per dose pack   No facility-administered encounter medications on file as of 08/27/2018.     Surgical History: Past Surgical History:  Procedure Laterality Date  . ANGIOPLASTY  2009  . CARDIAC CATHETERIZATION N/A 03/18/2015   Procedure: Left Heart Cath with Coronary Angiography;  Surgeon: Lamar Blinks, MD;  Location: [facility] INVASIVE CV LAB;  Service: Cardiovascular;  Laterality: N/A;  . CHOLECYSTECTOMY  2005  . CORONARY ANGIOGRAM  2009    Medical History: Past Medical History:  Diagnosis Date  . Arthritis   . Asthma   . Atrial fibrillation (HCC)   . CHF (congestive heart failure) (HCC)   . COPD (chronic obstructive pulmonary disease) (HCC)   . Coronary artery disease   . GERD (gastroesophageal reflux disease)   . Hypertension   . Myocardial infarction (HCC)   . Shortness of breath dyspnea     Family History: Family History  Problem Relation Age of Onset  . Coronary artery disease Father   . Diabetes Father   . Hyperlipidemia Father   . Hypertension Father     Social History: Social History   Socioeconomic History  . Marital status: Married    Spouse name:  Not on file  . Number of children: Not on file  . Years of education: Not on file  . Highest education level: Not on file  Occupational History  . Not on file  Social Needs  . Financial resource strain: Not on file  . Food insecurity:    Worry: Not on file    Inability: Not on file  . Transportation needs:    Medical: Not on file    Non-medical: Not on file  Tobacco Use  . Smoking status: Former Smoker    Years: 25.00    Last attempt to quit: 03/20/1997     Years since quitting: 21.4  . Smokeless tobacco: Never Used  Substance and Sexual Activity  . Alcohol use: No  . Drug use: No  . Sexual activity: Not on file  Lifestyle  . Physical activity:    Days per week: Not on file    Minutes per session: Not on file  . Stress: Not on file  Relationships  . Social connections:    Talks on phone: Not on file    Gets together: Not on file    Attends religious service: Not on file    Active member of club or organization: Not on file    Attends meetings of clubs or organizations: Not on file    Relationship status: Not on file  . Intimate partner violence:    Fear of current or ex partner: Not on file    Emotionally abused: Not on file    Physically abused: Not on file    Forced sexual activity: Not on file  Other Topics Concern  . Not on file  Social History Narrative  . Not on file    Vital Signs: Blood pressure 132/88, pulse (!) 59, resp. rate (!) 22, height 5\' 10"  (1.778 m), weight 260 lb (117.9 kg), SpO2 90 %.  Examination: General Appearance: The patient is well-developed, well-nourished, and in no distress. Skin: Gross inspection of skin unremarkable. Head: normocephalic, no gross deformities. Eyes: no gross deformities noted. ENT: ears appear grossly normal no exudates. Neck: Supple. No thyromegaly. No LAD. Respiratory: scattered rhonchi. Cardiovascular: Normal S1 and S2 without murmur or rub. Extremities: No cyanosis. pulses are equal. Neurologic: Alert and oriented. No involuntary movements.  LABS: Recent Results (from the past 2160 hour(s))  POCT Influenza A/B     Status: None   Collection Time: 06/15/18  2:27 PM  Result Value Ref Range   Influenza A, POC Negative Negative   Influenza B, POC Negative Negative    Radiology: No results found.  No results found.  No results found.    Assessment and Plan: Patient Active Problem List   Diagnosis Date Noted  . Dorsalgia 09/11/2017  . Occlusion and  stenosis of bilateral carotid arteries 09/11/2017  . Pulmonic heart disease (HCC) 09/11/2017  . COPD with hypoxia (HCC) 08/14/2017  . Chronic a-fib 03/27/2015  . CAD (coronary artery disease) 03/02/2015  . 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  . Essential hypertension with goal blood pressure less than 130/80 02/23/2015    1. COPD severe disease we will get a follow-up pulmonary function study ordered on him to reassess.  Patient is going to need to continue with his current medication regimen.  He is also on oxygen which should be continued as ordered to help with his COPD. 2. A fib chronic atrial fibrillation is currently controlled by rate we will continue to monitor closely 3. CAD no active chest pain is noted he will  follow-up with his primary cardiologist 4. Chronic respiratory Failure hypoxia on oxygen therapy this shall serve as a face-to-face evaluation for ongoing oxygen needs he is using oxygen and does gain benefit from it  General Counseling: I have discussed the findings of the evaluation and examination with Erik [name].  I have also discussed any further diagnostic evaluation thatmay be needed or ordered today. Erik [name] verbalizes understanding of the findings of todays visit. We also reviewed his medications today and discussed drug interactions and side effects including but not limited excessive drowsiness and altered mental states. We also discussed that there is always a risk not just to him but also people around him. he has been encouraged to call the office with any questions or concerns that should arise related to todays visit.    Time spent: 15 minutes  I have personally obtained a history, examined the patient, evaluated laboratory and imaging results, formulated the assessment and plan and placed orders.    Yevonne PaxSaadat A Khan, MD [facility] Pulmonary and Critical Care Sleep medicine

## 2018-09-18 DIAGNOSIS — J449 Chronic obstructive pulmonary disease, unspecified: Secondary | ICD-10-CM | POA: Diagnosis not present

## 2018-09-18 DIAGNOSIS — J439 Emphysema, unspecified: Secondary | ICD-10-CM | POA: Diagnosis not present

## 2018-10-03 ENCOUNTER — Ambulatory Visit: Payer: Medicare Other | Admitting: Internal Medicine

## 2018-10-03 DIAGNOSIS — R0602 Shortness of breath: Secondary | ICD-10-CM

## 2018-10-03 LAB — PULMONARY FUNCTION TEST

## 2018-10-05 NOTE — Procedures (Signed)
[facility] [address] [place], [address]  DATE OF SERVICE: October 03, 2018  Complete Pulmonary Function Testing Interpretation:  FINDINGS:  The forced vital capacity is moderately decreased.  The FEV1 is 1.24 L which is 36% of predicted and is severely decreased.  FEV1 FVC ratio is severely decreased.  Postbronchodilator there is no significant change in the FEV1.  Total lung capacity is mildly decreased residual volume is normal residual volume total lung capacity ratio is increased VOLUMES were normal.  DLCO is severely decreased.  IMPRESSION:  This pulmonary function study is consistent with severe obstructive lung disease and mild restrictive lung disease.  The DLCO was severely decreased clinical correlation is recommended  Yevonne Pax, MD [facility] Pulmonary Critical Care Medicine Sleep Medicine

## 2018-10-17 DIAGNOSIS — J439 Emphysema, unspecified: Secondary | ICD-10-CM | POA: Diagnosis not present

## 2018-10-17 DIAGNOSIS — J449 Chronic obstructive pulmonary disease, unspecified: Secondary | ICD-10-CM | POA: Diagnosis not present

## 2018-10-18 ENCOUNTER — Telehealth: Payer: Self-pay

## 2018-10-18 NOTE — Telephone Encounter (Signed)
Pt called having sob,coughing and headache no fever and body aches and pt unable to walk as per adam I advised pt call 911 or go to ER for sob

## 2018-11-17 DIAGNOSIS — J449 Chronic obstructive pulmonary disease, unspecified: Secondary | ICD-10-CM | POA: Diagnosis not present

## 2018-11-17 DIAGNOSIS — J439 Emphysema, unspecified: Secondary | ICD-10-CM | POA: Diagnosis not present

## 2018-12-17 DIAGNOSIS — J439 Emphysema, unspecified: Secondary | ICD-10-CM | POA: Diagnosis not present

## 2018-12-17 DIAGNOSIS — J449 Chronic obstructive pulmonary disease, unspecified: Secondary | ICD-10-CM | POA: Diagnosis not present

## 2018-12-24 DIAGNOSIS — I25118 Atherosclerotic heart disease of native coronary artery with other forms of angina pectoris: Secondary | ICD-10-CM | POA: Diagnosis not present

## 2018-12-24 DIAGNOSIS — I482 Chronic atrial fibrillation, unspecified: Secondary | ICD-10-CM | POA: Diagnosis not present

## 2018-12-24 DIAGNOSIS — J449 Chronic obstructive pulmonary disease, unspecified: Secondary | ICD-10-CM | POA: Diagnosis not present

## 2018-12-24 DIAGNOSIS — E782 Mixed hyperlipidemia: Secondary | ICD-10-CM | POA: Diagnosis not present

## 2018-12-24 DIAGNOSIS — I10 Essential (primary) hypertension: Secondary | ICD-10-CM | POA: Diagnosis not present

## 2019-01-17 DIAGNOSIS — J439 Emphysema, unspecified: Secondary | ICD-10-CM | POA: Diagnosis not present

## 2019-01-17 DIAGNOSIS — J449 Chronic obstructive pulmonary disease, unspecified: Secondary | ICD-10-CM | POA: Diagnosis not present

## 2019-01-30 DIAGNOSIS — E782 Mixed hyperlipidemia: Secondary | ICD-10-CM | POA: Diagnosis not present

## 2019-01-30 DIAGNOSIS — I482 Chronic atrial fibrillation, unspecified: Secondary | ICD-10-CM | POA: Diagnosis not present

## 2019-01-30 DIAGNOSIS — I25118 Atherosclerotic heart disease of native coronary artery with other forms of angina pectoris: Secondary | ICD-10-CM | POA: Diagnosis not present

## 2019-01-30 DIAGNOSIS — I10 Essential (primary) hypertension: Secondary | ICD-10-CM | POA: Diagnosis not present

## 2019-02-16 DIAGNOSIS — J439 Emphysema, unspecified: Secondary | ICD-10-CM | POA: Diagnosis not present

## 2019-02-16 DIAGNOSIS — J449 Chronic obstructive pulmonary disease, unspecified: Secondary | ICD-10-CM | POA: Diagnosis not present

## 2019-03-08 ENCOUNTER — Ambulatory Visit: Payer: Self-pay | Admitting: Nurse Practitioner

## 2019-03-11 ENCOUNTER — Ambulatory Visit: Payer: Self-pay | Admitting: Internal Medicine

## 2019-03-19 DIAGNOSIS — J449 Chronic obstructive pulmonary disease, unspecified: Secondary | ICD-10-CM | POA: Diagnosis not present

## 2019-03-19 DIAGNOSIS — J439 Emphysema, unspecified: Secondary | ICD-10-CM | POA: Diagnosis not present

## 2019-04-19 DIAGNOSIS — J439 Emphysema, unspecified: Secondary | ICD-10-CM | POA: Diagnosis not present

## 2019-04-19 DIAGNOSIS — J449 Chronic obstructive pulmonary disease, unspecified: Secondary | ICD-10-CM | POA: Diagnosis not present

## 2019-05-06 DIAGNOSIS — I25118 Atherosclerotic heart disease of native coronary artery with other forms of angina pectoris: Secondary | ICD-10-CM | POA: Diagnosis not present

## 2019-05-06 DIAGNOSIS — I10 Essential (primary) hypertension: Secondary | ICD-10-CM | POA: Diagnosis not present

## 2019-05-06 DIAGNOSIS — I482 Chronic atrial fibrillation, unspecified: Secondary | ICD-10-CM | POA: Diagnosis not present

## 2019-05-06 DIAGNOSIS — E782 Mixed hyperlipidemia: Secondary | ICD-10-CM | POA: Diagnosis not present

## 2019-05-06 DIAGNOSIS — J449 Chronic obstructive pulmonary disease, unspecified: Secondary | ICD-10-CM | POA: Diagnosis not present

## 2019-05-19 DIAGNOSIS — J439 Emphysema, unspecified: Secondary | ICD-10-CM | POA: Diagnosis not present

## 2019-05-19 DIAGNOSIS — J449 Chronic obstructive pulmonary disease, unspecified: Secondary | ICD-10-CM | POA: Diagnosis not present

## 2019-05-30 ENCOUNTER — Other Ambulatory Visit: Payer: Self-pay | Admitting: Adult Health

## 2019-05-30 DIAGNOSIS — J449 Chronic obstructive pulmonary disease, unspecified: Secondary | ICD-10-CM

## 2019-06-17 ENCOUNTER — Ambulatory Visit: Payer: Medicare Other | Admitting: Adult Health

## 2019-06-17 ENCOUNTER — Other Ambulatory Visit: Payer: Self-pay

## 2019-06-18 ENCOUNTER — Other Ambulatory Visit: Payer: Self-pay

## 2019-06-18 ENCOUNTER — Ambulatory Visit (INDEPENDENT_AMBULATORY_CARE_PROVIDER_SITE_OTHER): Payer: Medicare Other | Admitting: Adult Health

## 2019-06-18 DIAGNOSIS — R05 Cough: Secondary | ICD-10-CM

## 2019-06-18 DIAGNOSIS — R059 Cough, unspecified: Secondary | ICD-10-CM

## 2019-06-18 DIAGNOSIS — Z9981 Dependence on supplemental oxygen: Secondary | ICD-10-CM | POA: Diagnosis not present

## 2019-06-18 DIAGNOSIS — J988 Other specified respiratory disorders: Secondary | ICD-10-CM | POA: Diagnosis not present

## 2019-06-18 DIAGNOSIS — J9611 Chronic respiratory failure with hypoxia: Secondary | ICD-10-CM

## 2019-06-18 MED ORDER — AZITHROMYCIN 250 MG PO TABS: ORAL_TABLET | ORAL | 0 refills | Status: DC

## 2019-06-18 NOTE — Progress Notes (Signed)
[facility] [address] [place], [place] [address]  Internal MEDICINE  Telephone Visit  Patient Name: Erik [name]  [id number]  [id number]  Date of Service: 06/30/2019  I connected with the patient at 158 by telephone and verified the patients identity using two identifiers.   I discussed the limitations, risks, security and privacy concerns of performing an evaluation and management service by telephone and the availability of in person appointments. I also discussed with the patient that there may be a patient responsible charge related to the service.  The patient expressed understanding and agrees to proceed.    Chief Complaint  Patient presents with  . Telephone Assessment  . Telephone Screen  . Cough  . Chills  . Fever  . Shortness of Breath    HPI  PT seen on telephone, he reports for the last week or two, he has been coughing and sob.  He reports episodes of choking, and feeling smothered. He reports he has been drinking hot tea with lemon and honey, and that has helped a lot. Denies and fever, or other symptoms currently.   Current Medication: Outpatient Encounter Medications as of 06/18/2019  Medication Sig  . albuterol-ipratropium (COMBIVENT) 18-103 MCG/ACT inhaler Inhale 2 puffs into the lungs every 4 (four) hours.  [name] aspirin 325 MG tablet Take 325 mg by mouth daily.  [name] diltiazem (CARDIZEM CD) 180 MG 24 hr capsule Take 1 capsule (180 mg total) by mouth daily.  [name] diltiazem (CARTIA XT) 120 MG 24 hr capsule TAKE ONE CAPSULE BY MOUTH EVERY DAY  . furosemide (LASIX) 20 MG tablet Take 20 mg by mouth daily.  [name] ipratropium-albuterol (DUONEB) 0.5-2.5 (3) MG/3ML SOLN USE 1 VIAL VIA NEBULIZER EVERY 6 HOURS  . isosorbide mononitrate (IMDUR) 30 MG 24 hr tablet Take 1 tablet by mouth daily. Pt states he takes 15mg   . loratadine (CLARITIN) 10 MG tablet Take 10 mg by mouth daily.  . mometasone-formoterol (DULERA) 200-5 MCG/ACT AERO Inhale 2 Inhalers into the lungs 2  (two) times daily.  . OXYGEN Inhale into the lungs.  azithromycin (ZITHROMAX) 250 MG tablet Take as directed.  . [DISCONTINUED] levofloxacin (LEVAQUIN) 500 MG tablet Take 1 tablet (500 mg total) by mouth daily. (Patient not taking: Reported on 06/18/2019)  . [DISCONTINUED] predniSONE (DELTASONE) 10 MG tablet Use per dose pack (Patient not taking: Reported on 06/18/2019)   No facility-administered encounter medications on file as of 06/18/2019.     Surgical History: Past Surgical History:  Procedure Laterality Date  . ANGIOPLASTY  2009  . CARDIAC CATHETERIZATION N/A 03/18/2015   Procedure: Left Heart Cath with Coronary Angiography;  Surgeon: 05/18/2015, MD;  Location: [facility] INVASIVE CV LAB;  Service: Cardiovascular;  Laterality: N/A;  . CHOLECYSTECTOMY  2005  . CORONARY ANGIOGRAM  2009    Medical History: Past Medical History:  Diagnosis Date  . Arthritis   . Asthma   . Atrial fibrillation (HCC)   . CHF (congestive heart failure) (HCC)   . COPD (chronic obstructive pulmonary disease) (HCC)   . Coronary artery disease   . GERD (gastroesophageal reflux disease)   . Hypertension   . Myocardial infarction (HCC)   . Shortness of breath dyspnea     Family History: Family History  Problem Relation Age of Onset  . Coronary artery disease Father   . Diabetes Father   . Hyperlipidemia Father   . Hypertension Father     Social History   Socioeconomic History  . Marital  status: Married    Spouse name: Not on file  . Number of children: Not on file  . Years of education: Not on file  . Highest education level: Not on file  Occupational History  . Not on file  Social Needs  . Financial resource strain: Not on file  . Food insecurity    Worry: Not on file    Inability: Not on file  . Transportation needs    Medical: Not on file    Non-medical: Not on file  Tobacco Use  . Smoking status: Former Smoker    Years: 25.00    Quit date: 03/20/1997    Years since  quitting: 22.2  . Smokeless tobacco: Never Used  Substance and Sexual Activity  . Alcohol use: No  . Drug use: No  . Sexual activity: Not on file  Lifestyle  . Physical activity    Days per week: Not on file    Minutes per session: Not on file  . Stress: Not on file  Relationships  . Social Musicianconnections    Talks on phone: Not on file    Gets together: Not on file    Attends religious service: Not on file    Active member of club or organization: Not on file    Attends meetings of clubs or organizations: Not on file    Relationship status: Not on file  . Intimate partner violence    Fear of current or ex partner: Not on file    Emotionally abused: Not on file    Physically abused: Not on file    Forced sexual activity: Not on file  Other Topics Concern  . Not on file  Social History Narrative  . Not on file      Review of Systems  Constitutional: Negative.  Negative for chills, fatigue and unexpected weight change.  HENT: Negative.  Negative for congestion, rhinorrhea, sneezing and sore throat.   Eyes: Negative for redness.  Respiratory: Positive for cough and shortness of breath. Negative for chest tightness.   Cardiovascular: Negative.  Negative for chest pain and palpitations.  Gastrointestinal: Negative.  Negative for abdominal pain, constipation, diarrhea, nausea and vomiting.  Endocrine: Negative.   Genitourinary: Negative.  Negative for dysuria and frequency.  Musculoskeletal: Negative.  Negative for arthralgias, back pain, joint swelling and neck pain.  Skin: Negative.  Negative for rash.  Allergic/Immunologic: Negative.   Neurological: Negative.  Negative for tremors and numbness.  Hematological: Negative for adenopathy. Does not bruise/bleed easily.  Psychiatric/Behavioral: Negative.  Negative for behavioral problems, sleep disturbance and suicidal ideas. The patient is not nervous/anxious.     Vital Signs: There were no vitals taken for this  visit.   Observation/Objective:  Well sounding, NAD noted.   Assessment/Plan: 1. Respiratory infection Advised patient to take entire course of antibiotics as prescribed with food. Pt should return to clinic in 7-10 days if symptoms fail to improve or new symptoms develop.  - azithromycin (ZITHROMAX) 250 MG tablet; Take as directed.  Dispense: 6 tablet; Refill: 0  2. Cough Will get CXR - DG Chest 2 View; Future  3. Chronic respiratory failure with hypoxia (HCC) Continue to use inhaler, and oxygen as directed. Use nebulizers PRN.   4. Supplemental oxygen dependent Contineu 2 lpm via nasal canula.   General Counseling: Jaxin verbalizes understanding of the findings of today's phone visit and agrees with plan of treatment. I have discussed any further diagnostic evaluation that may be needed or ordered today. We  also reviewed his medications today. he has been encouraged to call the office with any questions or concerns that should arise related to todays visit.    Orders Placed This Encounter  Procedures  . DG Chest 2 View    Meds ordered this encounter  Medications  . azithromycin (ZITHROMAX) 250 MG tablet    Sig: Take as directed.    Dispense:  6 tablet    Refill:  0    Time spent: Raoul AGNP-C Internal medicine

## 2019-06-19 DIAGNOSIS — J449 Chronic obstructive pulmonary disease, unspecified: Secondary | ICD-10-CM | POA: Diagnosis not present

## 2019-06-19 DIAGNOSIS — J439 Emphysema, unspecified: Secondary | ICD-10-CM | POA: Diagnosis not present

## 2019-07-01 ENCOUNTER — Other Ambulatory Visit: Payer: Self-pay

## 2019-07-01 ENCOUNTER — Ambulatory Visit
Admission: RE | Admit: 2019-07-01 | Discharge: 2019-07-01 | Disposition: A | Discharge status: Home / Self Care | Payer: Medicare Other | Source: Ambulatory Visit | Attending: Adult Health | Admitting: Adult Health

## 2019-07-01 DIAGNOSIS — R05 Cough: Secondary | ICD-10-CM | POA: Diagnosis not present

## 2019-07-01 DIAGNOSIS — R079 Chest pain, unspecified: Secondary | ICD-10-CM | POA: Diagnosis not present

## 2019-07-01 DIAGNOSIS — R0602 Shortness of breath: Secondary | ICD-10-CM | POA: Diagnosis not present

## 2019-07-01 DIAGNOSIS — R059 Cough, unspecified: Secondary | ICD-10-CM

## 2019-07-19 DIAGNOSIS — J449 Chronic obstructive pulmonary disease, unspecified: Secondary | ICD-10-CM | POA: Diagnosis not present

## 2019-07-19 DIAGNOSIS — J439 Emphysema, unspecified: Secondary | ICD-10-CM | POA: Diagnosis not present

## 2019-08-05 ENCOUNTER — Other Ambulatory Visit: Payer: Self-pay

## 2019-08-05 DIAGNOSIS — J988 Other specified respiratory disorders: Secondary | ICD-10-CM

## 2019-08-05 MED ORDER — PREDNISONE 10 MG (21) PO TBPK: ORAL_TABLET | ORAL | 0 refills | Status: AC

## 2019-08-05 MED ORDER — AZITHROMYCIN 250 MG PO TABS: ORAL_TABLET | ORAL | 0 refills | Status: DC

## 2019-08-19 DIAGNOSIS — J439 Emphysema, unspecified: Secondary | ICD-10-CM | POA: Diagnosis not present

## 2019-08-19 DIAGNOSIS — J449 Chronic obstructive pulmonary disease, unspecified: Secondary | ICD-10-CM | POA: Diagnosis not present

## 2019-08-28 ENCOUNTER — Encounter: Payer: Self-pay | Admitting: Adult Health

## 2019-08-28 ENCOUNTER — Ambulatory Visit (INDEPENDENT_AMBULATORY_CARE_PROVIDER_SITE_OTHER): Payer: Medicare Other | Admitting: Adult Health

## 2019-08-28 VITALS — BP 111/74 | HR 97 | Ht 70.0 in | Wt 234.0 lb

## 2019-08-28 DIAGNOSIS — R0902 Hypoxemia: Secondary | ICD-10-CM

## 2019-08-28 DIAGNOSIS — R252 Cramp and spasm: Secondary | ICD-10-CM

## 2019-08-28 DIAGNOSIS — Z9981 Dependence on supplemental oxygen: Secondary | ICD-10-CM

## 2019-08-28 DIAGNOSIS — J9611 Chronic respiratory failure with hypoxia: Secondary | ICD-10-CM

## 2019-08-28 DIAGNOSIS — J449 Chronic obstructive pulmonary disease, unspecified: Secondary | ICD-10-CM | POA: Diagnosis not present

## 2019-08-28 MED ORDER — TRELEGY ELLIPTA 100-62.5-25 MCG/INH IN AEPB: 1.0000 | INHALATION_SPRAY | Freq: Every day | RESPIRATORY_TRACT | 2 refills | Status: DC

## 2019-08-28 NOTE — Progress Notes (Signed)
[facility] [place], [place] [address]  Internal MEDICINE  Telephone Visit  Patient Name: Erik [name]  [id number]  [id number]  Date of Service: 08/28/2019  I connected with the patient at 409 by telephone and verified the patients identity using two identifiers.   I discussed the limitations, risks, security and privacy concerns of performing an evaluation and management service by telephone and the availability of in person appointments. I also discussed with the patient that there may be a patient responsible charge related to the service.  The patient expressed understanding and agrees to proceed.    Chief Complaint  Patient presents with  . Telephone Assessment  . Telephone Screen  . Muscle Pain    last couple of days leg aches have been worse than usual   . Cough    congestion, greyish white phlegm   . Breathing Problem    trouble breathings, when he coughs up phlegm he feels better, the other day he had his oxygen was on and he felt like he couldnt breathe as well with it on.     HPI  Pt seen via telephone. Pt reports about 3 days of semi-productive cough, bilateral leg pain and hip pain.  He also has had increased sob. He reports this leg pain is not new, it is just getting worse lately.    Current Medication: Outpatient Encounter Medications as of 08/28/2019  Medication Sig  . albuterol-ipratropium (COMBIVENT) 18-103 MCG/ACT inhaler Inhale 2 puffs into the lungs every 4 (four) hours.  [name] aspirin 325 MG tablet Take 325 mg by mouth daily.  [name] azithromycin (ZITHROMAX) 250 MG tablet Take as directed.  . diltiazem (CARDIZEM CD) 180 MG 24 hr capsule Take 1 capsule (180 mg total) by mouth daily.  [name] diltiazem (CARTIA XT) 120 MG 24 hr capsule TAKE ONE CAPSULE BY MOUTH EVERY DAY  . furosemide (LASIX) 20 MG tablet Take 20 mg by mouth daily.  [name] ipratropium-albuterol (DUONEB) 0.5-2.5 (3) MG/3ML SOLN USE 1 VIAL VIA NEBULIZER EVERY 6 HOURS  . isosorbide  mononitrate (IMDUR) 30 MG 24 hr tablet Take 1 tablet by mouth daily. Pt states he takes 15mg   . loratadine (CLARITIN) 10 MG tablet Take 10 mg by mouth daily.  . mometasone-formoterol (DULERA) 200-5 MCG/ACT AERO Inhale 2 Inhalers into the lungs 2 (two) times daily.  . OXYGEN Inhale into the lungs.  . Fluticasone-Umeclidin-Vilant (TRELEGY ELLIPTA) 100-62.5-25 MCG/INH AEPB Inhale 1 puff into the lungs daily.   No facility-administered encounter medications on file as of 08/28/2019.    Surgical History: Past Surgical History:  Procedure Laterality Date  . ANGIOPLASTY  2009  . CARDIAC CATHETERIZATION N/A 03/18/2015   Procedure: Left Heart Cath with Coronary Angiography;  Surgeon: Corey Skains, MD;  Location: [facility];  Service: Cardiovascular;  Laterality: N/A;  . CHOLECYSTECTOMY  2005  . CORONARY ANGIOGRAM  2009    Medical History: Past Medical History:  Diagnosis Date  . Arthritis   . Asthma   . Atrial fibrillation ([place])   . CHF (congestive heart failure) ([place])   . COPD (chronic obstructive pulmonary disease) ([place])   . Coronary artery disease   . GERD (gastroesophageal reflux disease)   . Hypertension   . Myocardial infarction ([place])   . Shortness of breath dyspnea     Family History: Family History  Problem Relation Age of Onset  . Coronary artery disease Father   . Diabetes Father   . Hyperlipidemia Father   .  Hypertension Father     Social History   Socioeconomic History  . Marital status: Married    Spouse name: Not on file  . Number of children: Not on file  . Years of education: Not on file  . Highest education level: Not on file  Occupational History  . Not on file  Tobacco Use  . Smoking status: Former Smoker    Years: 25.00    Quit date: 03/20/1997    Years since quitting: 22.4  . Smokeless tobacco: Never Used  Substance and Sexual Activity  . Alcohol use: No  . Drug use: No  . Sexual activity: Not on file  Other Topics Concern  . Not  on file  Social History Narrative  . Not on file   Social Determinants of Health   Financial Resource Strain:   . Difficulty of Paying Living Expenses: Not on file  Food Insecurity:   . Worried About Programme researcher, broadcasting/film/video in the Last Year: Not on file  . Ran Out of Food in the Last Year: Not on file  Transportation Needs:   . Lack of Transportation (Medical): Not on file  . Lack of Transportation (Non-Medical): Not on file  Physical Activity:   . Days of Exercise per Week: Not on file  . Minutes of Exercise per Session: Not on file  Stress:   . Feeling of Stress : Not on file  Social Connections:   . Frequency of Communication with Friends and Family: Not on file  . Frequency of Social Gatherings with Friends and Family: Not on file  . Attends Religious Services: Not on file  . Active Member of Clubs or Organizations: Not on file  . Attends Banker Meetings: Not on file  . Marital Status: Not on file  Intimate Partner Violence:   . Fear of Current or Ex-Partner: Not on file  . Emotionally Abused: Not on file  . Physically Abused: Not on file  . Sexually Abused: Not on file      Review of Systems  Constitutional: Negative.  Negative for chills, fatigue and unexpected weight change.  HENT: Negative.  Negative for congestion, rhinorrhea, sneezing and sore throat.   Eyes: Negative for redness.  Respiratory: Positive for cough and shortness of breath. Negative for chest tightness.   Cardiovascular: Negative.  Negative for chest pain and palpitations.  Gastrointestinal: Negative.  Negative for abdominal pain, constipation, diarrhea, nausea and vomiting.  Endocrine: Negative.   Genitourinary: Negative.  Negative for dysuria and frequency.  Musculoskeletal: Negative.  Negative for arthralgias, back pain, joint swelling and neck pain.  Skin: Negative.  Negative for rash.  Allergic/Immunologic: Negative.   Neurological: Negative.  Negative for tremors and numbness.   Hematological: Negative for adenopathy. Does not bruise/bleed easily.  Psychiatric/Behavioral: Negative.  Negative for behavioral problems, sleep disturbance and suicidal ideas. The patient is not nervous/anxious.     Vital Signs: BP 111/74   Pulse 97   Ht 5\' 10"  (1.778 m)   Wt 234 lb (106.1 kg)   SpO2 96%   BMI 33.58 kg/m    Observation/Objective: Well appearing, NAD noted.    Assessment/Plan: 1. COPD with hypoxia (HCC) Use Trelegy inhaler as discussed. May continue to use rescue inhaler as needed.  - Fluticasone-Umeclidin-Vilant (TRELEGY ELLIPTA) 100-62.5-25 MCG/INH AEPB; Inhale 1 puff into the lungs daily.  Dispense: 60 each; Refill: 2  2. Chronic respiratory failure with hypoxia (HCC) Continue to use oxygen continually, and using inhalers as discussed.  3. Supplemental oxygen dependent Continue to use oxygen as prescribed.   4. Muscle cramps Check electrolytes.  - Basic metabolic panel  General Counseling: Reno verbalizes understanding of the findings of today's phone visit and agrees with plan of treatment. I have discussed any further diagnostic evaluation that may be needed or ordered today. We also reviewed his medications today. he has been encouraged to call the office with any questions or concerns that should arise related to todays visit.    Orders Placed This Encounter  Procedures  . Basic metabolic panel    Meds ordered this encounter  Medications  . Fluticasone-Umeclidin-Vilant (TRELEGY ELLIPTA) 100-62.5-25 MCG/INH AEPB    Sig: Inhale 1 puff into the lungs daily.    Dispense:  60 each    Refill:  2    Time spent: 20 Minutes    Blima Ledger AGNP-C Internal medicine

## 2019-08-30 DIAGNOSIS — I10 Essential (primary) hypertension: Secondary | ICD-10-CM | POA: Diagnosis not present

## 2019-08-30 DIAGNOSIS — E782 Mixed hyperlipidemia: Secondary | ICD-10-CM | POA: Diagnosis not present

## 2019-08-30 DIAGNOSIS — I25118 Atherosclerotic heart disease of native coronary artery with other forms of angina pectoris: Secondary | ICD-10-CM | POA: Diagnosis not present

## 2019-08-30 DIAGNOSIS — I482 Chronic atrial fibrillation, unspecified: Secondary | ICD-10-CM | POA: Diagnosis not present

## 2019-09-02 ENCOUNTER — Telehealth: Payer: Self-pay

## 2019-09-02 NOTE — Telephone Encounter (Signed)
Patient advised to get chest x ray and follow up next week and if his breathing worsens to make sure he gets evaluated with ER per providers orders. beth

## 2019-09-02 NOTE — Telephone Encounter (Signed)
Pt called that having shortness breath and still coughing adam talk him we setup for chest xray and advised to take OTC Mucinex for cough and also advised him if its need to go ED ASAP and beth to setup for chestxray

## 2019-09-03 ENCOUNTER — Other Ambulatory Visit: Payer: Self-pay | Admitting: Internal Medicine

## 2019-09-03 DIAGNOSIS — R0602 Shortness of breath: Secondary | ICD-10-CM

## 2019-09-03 MED ORDER — SULFAMETHOXAZOLE-TRIMETHOPRIM 400-80 MG PO TABS: 1.0000 | ORAL_TABLET | Freq: Two times a day (BID) | ORAL | 0 refills | Status: AC

## 2019-09-03 MED ORDER — PREDNISONE 10 MG PO TABS: ORAL_TABLET | ORAL | 0 refills | Status: DC

## 2019-09-03 NOTE — Progress Notes (Signed)
ST

## 2019-09-03 NOTE — Telephone Encounter (Signed)
Spoke with him, sent in prescription for abx and prednisone, ordered CT, no need to order cxr, He had other complaints as well. He thinks he sees [place] for pcp, please schedule a follow up with [facility] only for pulmonary ( 2 weeks) and a pcp app with [facility] or [facility], He has some gaps that need to be resolved.

## 2019-09-09 ENCOUNTER — Ambulatory Visit
Admission: RE | Admit: 2019-09-09 | Discharge: 2019-09-09 | Disposition: A | Discharge status: Home / Self Care | Payer: Medicare Other | Source: Ambulatory Visit | Attending: Internal Medicine | Admitting: Internal Medicine

## 2019-09-09 ENCOUNTER — Telehealth: Payer: Self-pay

## 2019-09-09 ENCOUNTER — Other Ambulatory Visit: Payer: Self-pay

## 2019-09-09 DIAGNOSIS — R0602 Shortness of breath: Secondary | ICD-10-CM | POA: Diagnosis not present

## 2019-09-10 ENCOUNTER — Telehealth: Payer: Self-pay

## 2019-09-10 NOTE — Telephone Encounter (Signed)
Confirmed appointment with patient and screened for covid. klh 

## 2019-09-11 ENCOUNTER — Ambulatory Visit (INDEPENDENT_AMBULATORY_CARE_PROVIDER_SITE_OTHER): Payer: Medicare Other | Admitting: Adult Health

## 2019-09-11 ENCOUNTER — Encounter: Payer: Self-pay | Admitting: Adult Health

## 2019-09-11 ENCOUNTER — Other Ambulatory Visit: Payer: Self-pay

## 2019-09-11 VITALS — BP 144/73 | HR 80 | Temp 97.7°F | Resp 16 | Ht 70.0 in | Wt 243.4 lb

## 2019-09-11 DIAGNOSIS — J9611 Chronic respiratory failure with hypoxia: Secondary | ICD-10-CM

## 2019-09-11 DIAGNOSIS — Z9981 Dependence on supplemental oxygen: Secondary | ICD-10-CM

## 2019-09-11 DIAGNOSIS — J449 Chronic obstructive pulmonary disease, unspecified: Secondary | ICD-10-CM | POA: Diagnosis not present

## 2019-09-11 DIAGNOSIS — R0902 Hypoxemia: Secondary | ICD-10-CM

## 2019-09-11 DIAGNOSIS — I2510 Atherosclerotic heart disease of native coronary artery without angina pectoris: Secondary | ICD-10-CM | POA: Diagnosis not present

## 2019-09-11 DIAGNOSIS — I2583 Coronary atherosclerosis due to lipid rich plaque: Secondary | ICD-10-CM

## 2019-09-11 NOTE — Telephone Encounter (Signed)
Done by mistake

## 2019-09-11 NOTE — Progress Notes (Signed)
[facility] [place], [place] [address]  Internal MEDICINE  Office Visit Note  Patient Name: Erik [name]  [id number]  [id number]  Date of Service: 09/11/2019  Chief Complaint  Patient presents with  . Follow-up    REVIEW CT    HPI  Pt is here for follow up on CT. His CT shows emphysema and aortic atherosclerosis.  He reports he sees Dr. Nehemiah Massed, who is following his heart and carotids. He continues to wear oxygen at 1 liter currently.  He wears his oxygen continually.  Pt has difficulty with many inhaled medications due to tongue swelling. He feels like he has been doing well over the last few days, but has had some SOB today.     Current Medication: Outpatient Encounter Medications as of 09/11/2019  Medication Sig  . albuterol-ipratropium (COMBIVENT) 18-103 MCG/ACT inhaler Inhale 2 puffs into the lungs every 4 (four) hours.  [name] aspirin 325 MG tablet Take 325 mg by mouth daily.  [name] diltiazem (CARDIZEM CD) 180 MG 24 hr capsule Take 1 capsule (180 mg total) by mouth daily.  [name] diltiazem (CARTIA XT) 120 MG 24 hr capsule TAKE ONE CAPSULE BY MOUTH EVERY DAY  . Fluticasone-Umeclidin-Vilant (TRELEGY ELLIPTA) 100-62.5-25 MCG/INH AEPB Inhale 1 puff into the lungs daily.  . furosemide (LASIX) 20 MG tablet Take 20 mg by mouth daily.  [name] ipratropium-albuterol (DUONEB) 0.5-2.5 (3) MG/3ML SOLN USE 1 VIAL VIA NEBULIZER EVERY 6 HOURS  . isosorbide mononitrate (IMDUR) 30 MG 24 hr tablet Take 1 tablet by mouth daily. Pt states he takes 15mg   . loratadine (CLARITIN) 10 MG tablet Take 10 mg by mouth daily.  . mometasone-formoterol (DULERA) 200-5 MCG/ACT AERO Inhale 2 Inhalers into the lungs 2 (two) times daily.  . OXYGEN Inhale into the lungs.  . predniSONE (DELTASONE) 10 MG tablet Take one tab 3 x day for 3 days, then take one tab 2 x a day for 3 days and then take one tab a day for 3 days for copd  . sulfamethoxazole-trimethoprim (BACTRIM) 400-80 MG tablet Take 1 tablet by  mouth 2 (two) times daily for 10 days. For copd   No facility-administered encounter medications on file as of 09/11/2019.    Surgical History: Past Surgical History:  Procedure Laterality Date  . ANGIOPLASTY  2009  . CARDIAC CATHETERIZATION N/A 03/18/2015   Procedure: Left Heart Cath with Coronary Angiography;  Surgeon: Corey Skains, MD;  Location: [facility];  Service: Cardiovascular;  Laterality: N/A;  . CHOLECYSTECTOMY  2005  . CORONARY ANGIOGRAM  2009    Medical History: Past Medical History:  Diagnosis Date  . Arthritis   . Asthma   . Atrial fibrillation ([place])   . CHF (congestive heart failure) ([place])   . COPD (chronic obstructive pulmonary disease) ([place])   . Coronary artery disease   . GERD (gastroesophageal reflux disease)   . Hypertension   . Myocardial infarction ([place])   . Shortness of breath dyspnea     Family History: Family History  Problem Relation Age of Onset  . Coronary artery disease Father   . Diabetes Father   . Hyperlipidemia Father   . Hypertension Father     Social History   Socioeconomic History  . Marital status: Married    Spouse name: Not on file  . Number of children: Not on file  . Years of education: Not on file  . Highest education level: Not on file  Occupational History  .  Not on file  Tobacco Use  . Smoking status: Former Smoker    Years: 25.00    Quit date: 03/20/1997    Years since quitting: 22.4  . Smokeless tobacco: Never Used  Substance and Sexual Activity  . Alcohol use: No  . Drug use: No  . Sexual activity: Not on file  Other Topics Concern  . Not on file  Social History Narrative  . Not on file   Social Determinants of Health   Financial Resource Strain:   . Difficulty of Paying Living Expenses: Not on file  Food Insecurity:   . Worried About Programme researcher, broadcasting/film/video in the Last Year: Not on file  . Ran Out of Food in the Last Year: Not on file  Transportation Needs:   . Lack of Transportation  (Medical): Not on file  . Lack of Transportation (Non-Medical): Not on file  Physical Activity:   . Days of Exercise per Week: Not on file  . Minutes of Exercise per Session: Not on file  Stress:   . Feeling of Stress : Not on file  Social Connections:   . Frequency of Communication with Friends and Family: Not on file  . Frequency of Social Gatherings with Friends and Family: Not on file  . Attends Religious Services: Not on file  . Active Member of Clubs or Organizations: Not on file  . Attends Banker Meetings: Not on file  . Marital Status: Not on file  Intimate Partner Violence:   . Fear of Current or Ex-Partner: Not on file  . Emotionally Abused: Not on file  . Physically Abused: Not on file  . Sexually Abused: Not on file      Review of Systems  Constitutional: Negative.  Negative for chills, fatigue and unexpected weight change.  HENT: Negative.  Negative for congestion, rhinorrhea, sneezing and sore throat.   Eyes: Negative for redness.  Respiratory: Negative.  Negative for cough, chest tightness and shortness of breath.   Cardiovascular: Negative.  Negative for chest pain and palpitations.  Gastrointestinal: Negative.  Negative for abdominal pain, constipation, diarrhea, nausea and vomiting.  Endocrine: Negative.   Genitourinary: Negative.  Negative for dysuria and frequency.  Musculoskeletal: Negative.  Negative for arthralgias, back pain, joint swelling and neck pain.  Skin: Negative.  Negative for rash.  Allergic/Immunologic: Negative.   Neurological: Negative.  Negative for tremors and numbness.  Hematological: Negative for adenopathy. Does not bruise/bleed easily.  Psychiatric/Behavioral: Negative.  Negative for behavioral problems, sleep disturbance and suicidal ideas. The patient is not nervous/anxious.     Vital Signs: BP (!) 144/73   Pulse 80   Temp 97.7 F (36.5 C)   Resp 16   Ht 5\' 10"  (1.778 m)   Wt 243 lb 6.4 oz (110.4 kg)   SpO2 95%    BMI 34.92 kg/m    Physical Exam Vitals and nursing note reviewed.  Constitutional:      General: He is not in acute distress.    Appearance: He is well-developed. He is not diaphoretic.  HENT:     Head: Normocephalic and atraumatic.     Mouth/Throat:     Pharynx: No oropharyngeal exudate.  Eyes:     Pupils: Pupils are equal, round, and reactive to light.  Neck:     Thyroid: No thyromegaly.     Vascular: No JVD.     Trachea: No tracheal deviation.  Cardiovascular:     Rate and Rhythm: Normal rate and regular rhythm.  Heart sounds: Normal heart sounds. No murmur. No friction rub. No gallop.   Pulmonary:     Effort: Pulmonary effort is normal. No respiratory distress.     Breath sounds: No wheezing or rales.     Comments: Breath sounds diminished in upper lobes. Chest:     Chest wall: No tenderness.  Abdominal:     Palpations: Abdomen is soft.     Tenderness: There is no abdominal tenderness. There is no guarding.  Musculoskeletal:        General: Normal range of motion.     Cervical back: Normal range of motion and neck supple.  Lymphadenopathy:     Cervical: No cervical adenopathy.  Skin:    General: Skin is warm and dry.  Neurological:     Mental Status: He is alert and oriented to person, place, and time.     Cranial Nerves: No cranial nerve deficit.  Psychiatric:        Behavior: Behavior normal.        Thought Content: Thought content normal.        Judgment: Judgment normal.    Assessment/Plan: 1. COPD with hypoxia (HCC) Stable, continue to use medications and oxygen as directed.   2. Chronic respiratory failure with hypoxia (HCC) Stable, continue to use nebulizer, and inhalers as well   3. Supplemental oxygen dependent Continue to use oxygen continually as directed.   4. Coronary artery disease due to lipid rich plaque Followed by Dr. Gwen Pounds.   General Counseling: Erik [name] verbalizes understanding of the findings of todays visit and agrees with  plan of treatment. I have discussed any further diagnostic evaluation that may be needed or ordered today. We also reviewed his medications today. he has been encouraged to call the office with any questions or concerns that should arise related to todays visit.    No orders of the defined types were placed in this encounter.   No orders of the defined types were placed in this encounter.   Time spent: 25 Minutes   This patient was seen by Blima Ledger AGNP-C in Collaboration with Dr Lyndon Code as a part of collaborative care agreement     Johnna Acosta AGNP-C Internal medicine

## 2019-09-19 DIAGNOSIS — J449 Chronic obstructive pulmonary disease, unspecified: Secondary | ICD-10-CM | POA: Diagnosis not present

## 2019-09-19 DIAGNOSIS — J439 Emphysema, unspecified: Secondary | ICD-10-CM | POA: Diagnosis not present

## 2019-09-24 ENCOUNTER — Other Ambulatory Visit: Payer: Self-pay | Admitting: Internal Medicine

## 2019-09-24 DIAGNOSIS — J9611 Chronic respiratory failure with hypoxia: Secondary | ICD-10-CM

## 2019-09-30 ENCOUNTER — Telehealth: Payer: Self-pay

## 2019-09-30 ENCOUNTER — Other Ambulatory Visit: Payer: Self-pay

## 2019-09-30 ENCOUNTER — Encounter: Payer: Self-pay | Admitting: Internal Medicine

## 2019-09-30 ENCOUNTER — Ambulatory Visit: Payer: Medicare Other | Admitting: Internal Medicine

## 2019-09-30 VITALS — BP 134/83 | HR 82 | Temp 97.6°F | Resp 18 | Ht 70.0 in | Wt 245.0 lb

## 2019-09-30 DIAGNOSIS — R0602 Shortness of breath: Secondary | ICD-10-CM | POA: Diagnosis not present

## 2019-09-30 DIAGNOSIS — J9611 Chronic respiratory failure with hypoxia: Secondary | ICD-10-CM

## 2019-09-30 DIAGNOSIS — I482 Chronic atrial fibrillation, unspecified: Secondary | ICD-10-CM

## 2019-09-30 DIAGNOSIS — J449 Chronic obstructive pulmonary disease, unspecified: Secondary | ICD-10-CM

## 2019-09-30 DIAGNOSIS — R0902 Hypoxemia: Secondary | ICD-10-CM | POA: Diagnosis not present

## 2019-09-30 NOTE — Telephone Encounter (Signed)
Confirmed appointment on 10/02/2019. klh

## 2019-09-30 NOTE — Patient Instructions (Signed)
How to Use a Dry Powder Inhaler  A dry powder inhaler (DPI) is a device for taking medicine that must be breathed into the lungs (inhaled). You get one dose each time you inhale the medicine. Most adults use an inhaler two times each day. Your doctor will tell you how often to use your inhaler. There are several types of dry powder inhalers. They include:  Disc inhalers. These are shaped like a disc. The medicine is loaded when you slide the lever.  Inhalers that twist. The medicine is loaded when you twist the inhaler. Follow directions for use from your doctor. Read the package instructions that come with your inhaler. What are the risks? If you do not use your inhaler correctly, medicine might not get to your lungs to help you breathe. Inhaler medicine can cause side effects, such as:  Mouth infection.  Throat infection.  Cough.  A voice that sounds rough (hoarseness).  Headache.  A feeling that you are going to throw up (nausea).  Throwing up (vomiting).  Lung infection (pneumonia). This can happen if you have chronic obstructive lung (pulmonary) disease (COPD). Supplies needed:  An inhaler. The medicine that you will need is already in the inhaler. How to use a dry powder inhaler 1. Remove any gum or candy from your mouth. 2. Stand or sit up straight. 3. Activate the medicine in your inhaler, depending on the type of DPI you have. 4. Turn your head and breathe out. Do not breathe out into the mouthpiece. 5. Turn your head back to the mouthpiece and seal your lips around it. 6. Take a deep breath through your mouth. Do not breathe through your nose. 7. Hold your breath for 10 seconds. 8. Remove the inhaler from your mouth, turn your head, and breathe out slowly. 9. You may not feel the medicine going into your lungs. This is normal. 10. Check the dose number on the inhaler. It should go down when you use it. 11. Rinse your mouth with water. Do not swallow the  water. General recommendations  Do not drop or shake your inhaler.  Do not wash your inhaler. If the mouthpiece gets dirty, use a dry cloth to wipe it clean.  Do not breathe into the inhaler.  Store your inhaler in a dry place at room temperature. Do not store it in your bathroom.  Keep track of your doses: ? When the dose number is low, it is time to pick up a new inhaler at your pharmacy. ? When the dose number shows zero (0), throw away the inhaler. Follow these instructions at home:  Take your inhaled medicine only as told by your doctor.  Keep all follow-up visits as told by your doctor. This is important. Contact a health care provider if:  You have a sore mouth.  You have a sore throat.  You have a lasting (persistent) cough.  Your voice changes.  You have a fever.  You have any of these problems after starting to use your inhaler: ? You get headaches often. ? You often have a feeling that you are going to throw up. ? You throw up often.  Your asthma or COPD symptoms have not improved after you have been using your inhaler for a few weeks. Get help right away if:  You have very bad shortness of breath.  You have trouble breathing. Summary  A dry powder inhaler (DPI) is a device that has medicine in it that should help you breathe better.  Follow instructions from your doctor about using your inhaler.  Your doctor will prescribe the strength of the medicine and how often you need to inhale it.  Keep your inhaler dry. The medicine in the inhaler must be kept dry.  If you have any questions about how to use your inhaler, talk with your doctor or pharmacist. This information is not intended to replace advice given to you by your health care provider. Make sure you discuss any questions you have with your health care provider. Document Revised: 01/23/2018 Document Reviewed: 04/18/2016 Elsevier Patient Education  2020 [employer].

## 2019-09-30 NOTE — Progress Notes (Signed)
[facility] [address] [place], [place] [address]  Pulmonary Sleep Medicine   Office Visit Note  Patient Name: Erik [name] DOB: 1956-02-03 MRN [id number]  Date of Service: 09/30/2019  Complaints/HPI: SOB patient states he continues to have shortness of breath.  Denies smoking he is around secondhand smoke however.  He is on oxygen and states he is using the oxygen as needed.  He currently is on about 1 L of O2.  Had a CT scan done about 3 weeks ago which showed pretty significant emphysematous changes probably from his prior history of smoking.  I spoke to him about the natural disease progression of emphysema and that his breathing is likely to continue to get worse.  He denies having any cough with blood but does have a regular cough and admits to shortness of breath and wheezing.  He also noted some swelling of his legs he is supposed to be on diuretics.  When I specifically questioned him about his medications he noted that he is out of his inhalers which again is likely contributing to his shortness of breath.  I therefore gave him some samples of Trelegy  ROS  General: (-) fever, (-) chills, (-) night sweats, (-) weakness Skin: (-) rashes, (-) itching,. Eyes: (-) visual changes, (-) redness, (-) itching. Nose and Sinuses: (-) nasal stuffiness or itchiness, (-) postnasal drip, (-) nosebleeds, (-) sinus trouble. Mouth and Throat: (-) sore throat, (-) hoarseness. Neck: (-) swollen glands, (-) enlarged thyroid, (-) neck pain. Respiratory: + cough, (-) bloody sputum, + shortness of breath, + wheezing. Cardiovascular: + ankle swelling, (-) chest pain. Lymphatic: (-) lymph node enlargement. Neurologic: (-) numbness, (-) tingling. Psychiatric: (-) anxiety, (-) depression   Current Medication: Outpatient Encounter Medications as of 09/30/2019  Medication Sig  . albuterol-ipratropium (COMBIVENT) 18-103 MCG/ACT inhaler Inhale 2 puffs into the lungs every 4 (four) hours.   [name] aspirin 325 MG tablet Take 325 mg by mouth daily.  [name] diltiazem (CARDIZEM CD) 180 MG 24 hr capsule Take 1 capsule (180 mg total) by mouth daily.  [name] diltiazem (CARTIA XT) 120 MG 24 hr capsule TAKE ONE CAPSULE BY MOUTH EVERY DAY  . furosemide (LASIX) 20 MG tablet Take 20 mg by mouth daily.  [name] ipratropium-albuterol (DUONEB) 0.5-2.5 (3) MG/3ML SOLN USE 1 VIAL VIA NEBULIZER EVERY 6 HOURS  . isosorbide mononitrate (IMDUR) 30 MG 24 hr tablet Take 1 tablet by mouth daily. Pt states he takes 15mg   . loratadine (CLARITIN) 10 MG tablet Take 10 mg by mouth daily.  . mometasone-formoterol (DULERA) 200-5 MCG/ACT AERO Inhale 2 Inhalers into the lungs 2 (two) times daily.  . OXYGEN Inhale into the lungs.  . predniSONE (DELTASONE) 10 MG tablet Take one tab 3 x day for 3 days, then take one tab 2 x a day for 3 days and then take one tab a day for 3 days for copd   No facility-administered encounter medications on file as of 09/30/2019.    Surgical History: Past Surgical History:  Procedure Laterality Date  . ANGIOPLASTY  2009  . CARDIAC CATHETERIZATION N/A 03/18/2015   Procedure: Left Heart Cath with Coronary Angiography;  Surgeon: 05/18/2015, MD;  Location: [facility] INVASIVE CV LAB;  Service: Cardiovascular;  Laterality: N/A;  . CHOLECYSTECTOMY  2005  . CORONARY ANGIOGRAM  2009    Medical History: Past Medical History:  Diagnosis Date  . Arthritis   . Asthma   . Atrial fibrillation (HCC)   . CHF (congestive  heart failure) (HCC)   . COPD (chronic obstructive pulmonary disease) (HCC)   . Coronary artery disease   . GERD (gastroesophageal reflux disease)   . Hypertension   . Myocardial infarction (HCC)   . Shortness of breath dyspnea     Family History: Family History  Problem Relation Age of Onset  . Coronary artery disease Father   . Diabetes Father   . Hyperlipidemia Father   . Hypertension Father     Social History: Social History   Socioeconomic History  . Marital status:  Married    Spouse name: Not on file  . Number of children: Not on file  . Years of education: Not on file  . Highest education level: Not on file  Occupational History  . Not on file  Tobacco Use  . Smoking status: Former Smoker    Years: 25.00    Quit date: 03/20/1997    Years since quitting: 22.5  . Smokeless tobacco: Never Used  Substance and Sexual Activity  . Alcohol use: No  . Drug use: No  . Sexual activity: Not on file  Other Topics Concern  . Not on file  Social History Narrative  . Not on file   Social Determinants of Health   Financial Resource Strain:   . Difficulty of Paying Living Expenses: Not on file  Food Insecurity:   . Worried About Programme researcher, broadcasting/film/video in the Last Year: Not on file  . Ran Out of Food in the Last Year: Not on file  Transportation Needs:   . Lack of Transportation (Medical): Not on file  . Lack of Transportation (Non-Medical): Not on file  Physical Activity:   . Days of Exercise per Week: Not on file  . Minutes of Exercise per Session: Not on file  Stress:   . Feeling of Stress : Not on file  Social Connections:   . Frequency of Communication with Friends and Family: Not on file  . Frequency of Social Gatherings with Friends and Family: Not on file  . Attends Religious Services: Not on file  . Active Member of Clubs or Organizations: Not on file  . Attends Banker Meetings: Not on file  . Marital Status: Not on file  Intimate Partner Violence:   . Fear of Current or Ex-Partner: Not on file  . Emotionally Abused: Not on file  . Physically Abused: Not on file  . Sexually Abused: Not on file    Vital Signs: Blood pressure 134/83, pulse 82, temperature 97.6 F (36.4 C), resp. rate 18, height 5\' 10"  (1.778 m), weight 245 lb (111.1 kg), SpO2 90 %.  Examination: General Appearance: The patient is well-developed, well-nourished, and in no distress. Skin: Gross inspection of skin unremarkable. Head: normocephalic, no gross  deformities. Eyes: no gross deformities noted. ENT: ears appear grossly normal no exudates. Neck: Supple. No thyromegaly. No LAD. Respiratory: diminished BS bilaterally. Cardiovascular: Normal S1 and S2 without murmur or rub. Extremities: No cyanosis. pulses are equal. Neurologic: Alert and oriented. No involuntary movements.  LABS: No results found for this or any previous visit (from the past 2160 hour(s)).  Radiology: CT CHEST WO CONTRAST  Result Date: 09/09/2019 CLINICAL DATA:  Shortness of breath and cough for several years EXAM: CT CHEST WITHOUT CONTRAST TECHNIQUE: Multidetector CT imaging of the chest was performed following the standard protocol without IV contrast. COMPARISON:  04/17/2008 FINDINGS: Cardiovascular: Atherosclerotic calcifications of the thoracic aorta are noted. Heavy coronary calcifications are seen. No aneurysmal dilatation  of the aorta is noted. No enlargement of the pulmonary artery is seen. No cardiac enlargement is noted. Mediastinum/Nodes: The esophagus is within normal limits. No sizable hilar or mediastinal adenopathy is noted. The thoracic inlet is within normal limits. Lungs/Pleura: Lungs are well aerated bilaterally with severe emphysematous bullous disease in the lungs primarily within the upper lobes. The overall appearance is stable from the prior study. No focal infiltrate, effusion or pneumothorax is seen. No sizable parenchymal nodule is noted. Upper Abdomen: Visualized upper abdomen demonstrates changes consistent with cholecystectomy. No other focal abnormality is noted. Musculoskeletal: No acute bony abnormality is noted. IMPRESSION: Diffuse bullous emphysematous disease similar to that noted on the prior exam. No acute abnormality noted. Aortic Atherosclerosis (ICD10-I70.0) and Emphysema (ICD10-J43.9). Electronically Signed   By: Inez Catalina M.D.   On: 09/09/2019 22:27    No results found.  CT CHEST WO CONTRAST  Result Date: 09/09/2019 CLINICAL DATA:   Shortness of breath and cough for several years EXAM: CT CHEST WITHOUT CONTRAST TECHNIQUE: Multidetector CT imaging of the chest was performed following the standard protocol without IV contrast. COMPARISON:  04/17/2008 FINDINGS: Cardiovascular: Atherosclerotic calcifications of the thoracic aorta are noted. Heavy coronary calcifications are seen. No aneurysmal dilatation of the aorta is noted. No enlargement of the pulmonary artery is seen. No cardiac enlargement is noted. Mediastinum/Nodes: The esophagus is within normal limits. No sizable hilar or mediastinal adenopathy is noted. The thoracic inlet is within normal limits. Lungs/Pleura: Lungs are well aerated bilaterally with severe emphysematous bullous disease in the lungs primarily within the upper lobes. The overall appearance is stable from the prior study. No focal infiltrate, effusion or pneumothorax is seen. No sizable parenchymal nodule is noted. Upper Abdomen: Visualized upper abdomen demonstrates changes consistent with cholecystectomy. No other focal abnormality is noted. Musculoskeletal: No acute bony abnormality is noted. IMPRESSION: Diffuse bullous emphysematous disease similar to that noted on the prior exam. No acute abnormality noted. Aortic Atherosclerosis (ICD10-I70.0) and Emphysema (ICD10-J43.9). Electronically Signed   By: Inez Catalina M.D.   On: 09/09/2019 22:27      Assessment and Plan: Patient Active Problem List   Diagnosis Date Noted  . Dorsalgia 09/11/2017  . Occlusion and stenosis of bilateral carotid arteries 09/11/2017  . Pulmonic heart disease ([place]) 09/11/2017  . COPD with hypoxia ([place]) 08/14/2017  . Chronic a-fib ([place]) 03/27/2015  . CAD (coronary artery disease) 03/02/2015  . 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  . Essential hypertension with goal blood pressure less than 130/80 02/23/2015    1. SOB/Emphysema he was supposed to be on dulera and duoneb and states that he is out of his inhalers. Likely contributing  to his SOB. CT scan shows emphysema as noted above. Not smoking but is around smoke secondhand.  Explained to him the importance of being compliant with inhalers we will try to work with him as far as being able to afford his inhalers today I gave him samples of Trelegy which he needs to continue to use.  Also explained to him the importance of rinsing out his mouth after usage of the powdered steroid inhalers. 2. Chronic respiratory failure with hypoxia on oxygen 1LPM he needs to be continued to be compliant with the oxygen therapy explained to him that it is important that he use the O2 as this is what is going to help him to the longer 3. Oxygen dependent as above on oxygen 4. Chronic A fib on cardizem rate controlled at this time will follow with his cardiologist  General Counseling: I have discussed the findings of the evaluation and examination with Linzie.  I have also discussed any further diagnostic evaluation thatmay be needed or ordered today. Christpher verbalizes understanding of the findings of todays visit. We also reviewed his medications today and discussed drug interactions and side effects including but not limited excessive drowsiness and altered mental states. We also discussed that there is always a risk not just to him but also people around him. he has been encouraged to call the office with any questions or concerns that should arise related to todays visit.  Orders Placed This Encounter  Procedures  . DG Chest 2 View    Standing Status:   Future    Standing Expiration Date:   11/27/2020    Order Specific Question:   Reason for Exam (SYMPTOM  OR DIAGNOSIS REQUIRED)    Answer:   SOB COPD    Order Specific Question:   Preferred imaging location?    Answer:   [facility]    Order Specific Question:   Radiology Contrast Protocol - do NOT remove file path    Answer:   \\[email or web address]     Time spent:  I have personally  obtained a history, examined the patient, evaluated laboratory and imaging results, formulated the assessment and plan and placed orders.    Yevonne Pax, MD [facility] Pulmonary and Critical Care Sleep medicine

## 2019-10-02 ENCOUNTER — Ambulatory Visit: Payer: Medicare Other | Admitting: Internal Medicine

## 2019-10-07 ENCOUNTER — Telehealth: Payer: Self-pay

## 2019-10-07 NOTE — Telephone Encounter (Signed)
Confirmed appointment on 10/09/2019 and screened for covid. klh 

## 2019-10-08 ENCOUNTER — Telehealth: Payer: Self-pay

## 2019-10-08 NOTE — Telephone Encounter (Signed)
Confirmed appointment on 10/10/2019 and screened for covid. klh 

## 2019-10-09 ENCOUNTER — Ambulatory Visit: Payer: Medicare Other | Admitting: Internal Medicine

## 2019-10-09 ENCOUNTER — Other Ambulatory Visit: Payer: Self-pay

## 2019-10-09 ENCOUNTER — Ambulatory Visit
Admission: RE | Admit: 2019-10-09 | Discharge: 2019-10-09 | Disposition: A | Discharge status: Home / Self Care | Payer: Medicare Other | Source: Ambulatory Visit | Attending: Internal Medicine | Admitting: Internal Medicine

## 2019-10-09 DIAGNOSIS — R0602 Shortness of breath: Secondary | ICD-10-CM | POA: Diagnosis not present

## 2019-10-09 DIAGNOSIS — J449 Chronic obstructive pulmonary disease, unspecified: Secondary | ICD-10-CM

## 2019-10-09 DIAGNOSIS — R0902 Hypoxemia: Secondary | ICD-10-CM | POA: Insufficient documentation

## 2019-10-09 DIAGNOSIS — R05 Cough: Secondary | ICD-10-CM | POA: Diagnosis not present

## 2019-10-09 LAB — PULMONARY FUNCTION TEST

## 2019-10-10 ENCOUNTER — Encounter: Payer: Self-pay | Admitting: Internal Medicine

## 2019-10-10 ENCOUNTER — Ambulatory Visit: Payer: Medicare Other | Admitting: Internal Medicine

## 2019-10-10 VITALS — BP 141/74 | HR 98 | Temp 97.4°F | Resp 16 | Ht 70.0 in | Wt 233.0 lb

## 2019-10-10 DIAGNOSIS — J449 Chronic obstructive pulmonary disease, unspecified: Secondary | ICD-10-CM

## 2019-10-10 DIAGNOSIS — I10 Essential (primary) hypertension: Secondary | ICD-10-CM | POA: Diagnosis not present

## 2019-10-10 DIAGNOSIS — J9611 Chronic respiratory failure with hypoxia: Secondary | ICD-10-CM

## 2019-10-10 DIAGNOSIS — Z9981 Dependence on supplemental oxygen: Secondary | ICD-10-CM | POA: Diagnosis not present

## 2019-10-10 DIAGNOSIS — I700 Atherosclerosis of aorta: Secondary | ICD-10-CM | POA: Diagnosis not present

## 2019-10-10 DIAGNOSIS — R911 Solitary pulmonary nodule: Secondary | ICD-10-CM

## 2019-10-10 DIAGNOSIS — R0902 Hypoxemia: Secondary | ICD-10-CM

## 2019-10-10 NOTE — Progress Notes (Signed)
[facility] [address] [place], [place] [address]  Pulmonary Sleep Medicine   Office Visit Note  Patient Name: Erik [name] DOB: 05-14-1956 MRN [id number]  Date of Service: 10/10/2019  Complaints/HPI: Pt is here for pulmonary follow up. He has a history of  He had a PFT that shows severe copd. He also had a CXR yesterday that shows a new 57mm nodule in right middle lobe.  A follow up CXR is recommended in 2-3 months.  Will order today, and he will have it done before next visit.   ROS  General: (-) fever, (-) chills, (-) night sweats, (-) weakness Skin: (-) rashes, (-) itching,. Eyes: (-) visual changes, (-) redness, (-) itching. Nose and Sinuses: (-) nasal stuffiness or itchiness, (-) postnasal drip, (-) nosebleeds, (-) sinus trouble. Mouth and Throat: (-) sore throat, (-) hoarseness. Neck: (-) swollen glands, (-) enlarged thyroid, (-) neck pain. Respiratory: - cough, (-) bloody sputum, - shortness of breath, - wheezing. Cardiovascular: - ankle swelling, (-) chest pain. Lymphatic: (-) lymph node enlargement. Neurologic: (-) numbness, (-) tingling. Psychiatric: (-) anxiety, (-) depression   Current Medication: Outpatient Encounter Medications as of 10/10/2019  Medication Sig  . albuterol-ipratropium (COMBIVENT) 18-103 MCG/ACT inhaler Inhale 2 puffs into the lungs every 4 (four) hours.  [name] aspirin 325 MG tablet Take 325 mg by mouth daily.  [name] diltiazem (CARDIZEM CD) 180 MG 24 hr capsule Take 1 capsule (180 mg total) by mouth daily.  [name] diltiazem (CARTIA XT) 120 MG 24 hr capsule TAKE ONE CAPSULE BY MOUTH EVERY DAY  . furosemide (LASIX) 20 MG tablet Take 20 mg by mouth daily.  [name] ipratropium-albuterol (DUONEB) 0.5-2.5 (3) MG/3ML SOLN USE 1 VIAL VIA NEBULIZER EVERY 6 HOURS  . isosorbide mononitrate (IMDUR) 30 MG 24 hr tablet Take 1 tablet by mouth daily. Pt states he takes 15mg   . loratadine (CLARITIN) 10 MG tablet Take 10 mg by mouth daily.  . mometasone-formoterol  (DULERA) 200-5 MCG/ACT AERO Inhale 2 Inhalers into the lungs 2 (two) times daily.  . OXYGEN Inhale into the lungs.  . predniSONE (DELTASONE) 10 MG tablet Take one tab 3 x day for 3 days, then take one tab 2 x a day for 3 days and then take one tab a day for 3 days for copd   No facility-administered encounter medications on file as of 10/10/2019.    Surgical History: Past Surgical History:  Procedure Laterality Date  . ANGIOPLASTY  2009  . CARDIAC CATHETERIZATION N/A 03/18/2015   Procedure: Left Heart Cath with Coronary Angiography;  Surgeon: 05/18/2015, MD;  Location: [facility] INVASIVE CV LAB;  Service: Cardiovascular;  Laterality: N/A;  . CHOLECYSTECTOMY  2005  . CORONARY ANGIOGRAM  2009    Medical History: Past Medical History:  Diagnosis Date  . Arthritis   . Asthma   . Atrial fibrillation (HCC)   . CHF (congestive heart failure) (HCC)   . COPD (chronic obstructive pulmonary disease) (HCC)   . Coronary artery disease   . GERD (gastroesophageal reflux disease)   . Hypertension   . Myocardial infarction (HCC)   . Shortness of breath dyspnea     Family History: Family History  Problem Relation Age of Onset  . Coronary artery disease Father   . Diabetes Father   . Hyperlipidemia Father   . Hypertension Father     Social History: Social History   Socioeconomic History  . Marital status: Married    Spouse name: Not on file  .  Number of children: Not on file  . Years of education: Not on file  . Highest education level: Not on file  Occupational History  . Not on file  Tobacco Use  . Smoking status: Former Smoker    Years: 25.00    Quit date: 03/20/1997    Years since quitting: 22.5  . Smokeless tobacco: Never Used  Substance and Sexual Activity  . Alcohol use: No  . Drug use: No  . Sexual activity: Not on file  Other Topics Concern  . Not on file  Social History Narrative  . Not on file   Social Determinants of Health   Financial Resource Strain:   .  Difficulty of Paying Living Expenses: Not on file  Food Insecurity:   . Worried About Programme researcher, broadcasting/film/video in the Last Year: Not on file  . Ran Out of Food in the Last Year: Not on file  Transportation Needs:   . Lack of Transportation (Medical): Not on file  . Lack of Transportation (Non-Medical): Not on file  Physical Activity:   . Days of Exercise per Week: Not on file  . Minutes of Exercise per Session: Not on file  Stress:   . Feeling of Stress : Not on file  Social Connections:   . Frequency of Communication with Friends and Family: Not on file  . Frequency of Social Gatherings with Friends and Family: Not on file  . Attends Religious Services: Not on file  . Active Member of Clubs or Organizations: Not on file  . Attends Banker Meetings: Not on file  . Marital Status: Not on file  Intimate Partner Violence:   . Fear of Current or Ex-Partner: Not on file  . Emotionally Abused: Not on file  . Physically Abused: Not on file  . Sexually Abused: Not on file    Vital Signs: There were no vitals taken for this visit.  Examination: General Appearance: The patient is well-developed, well-nourished, and in no distress. Skin: Gross inspection of skin unremarkable. Head: normocephalic, no gross deformities. Eyes: no gross deformities noted. ENT: ears appear grossly normal no exudates. Neck: Supple. No thyromegaly. No LAD. Respiratory: clear bilaterally. Cardiovascular: Normal S1 and S2 without murmur or rub. Extremities: No cyanosis. pulses are equal. Neurologic: Alert and oriented. No involuntary movements.  LABS: No results found for this or any previous visit (from the past 2160 hour(s)).  Radiology: DG Chest 2 View  Result Date: 10/09/2019 CLINICAL DATA:  64 year old male history of cough, congestion and shortness of breath. EXAM: CHEST - 2 VIEW COMPARISON:  Chest x-ray 07/01/2019. FINDINGS: Lung volumes are increased with emphysematous changes. Small 5 mm  nodular density projecting over the right mid lung. No other larger more suspicious appearing pulmonary nodules or masses are noted. No acute consolidative airspace disease. No pleural effusions. No evidence of pulmonary edema. No pneumothorax. Heart size is normal. Upper mediastinal contours are within normal limits. Aortic atherosclerosis. IMPRESSION: 1. No radiographic evidence of acute cardiopulmonary disease. 2. Emphysema. 3. Aortic atherosclerosis. 4. New 5 mm nodular density projecting over the right mid lung seen only on the frontal projection. Repeat standing PA and lateral chest radiograph is recommended in the next 2-3 months to ensure the stability or resolution of this finding. Electronically Signed   By: Trudie Reed M.D.   On: 10/09/2019 17:00    DG Chest 2 View  Result Date: 10/09/2019 CLINICAL DATA:  64 year old male history of cough, congestion and shortness of breath. EXAM:  CHEST - 2 VIEW COMPARISON:  Chest x-ray 07/01/2019. FINDINGS: Lung volumes are increased with emphysematous changes. Small 5 mm nodular density projecting over the right mid lung. No other larger more suspicious appearing pulmonary nodules or masses are noted. No acute consolidative airspace disease. No pleural effusions. No evidence of pulmonary edema. No pneumothorax. Heart size is normal. Upper mediastinal contours are within normal limits. Aortic atherosclerosis. IMPRESSION: 1. No radiographic evidence of acute cardiopulmonary disease. 2. Emphysema. 3. Aortic atherosclerosis. 4. New 5 mm nodular density projecting over the right mid lung seen only on the frontal projection. Repeat standing PA and lateral chest radiograph is recommended in the next 2-3 months to ensure the stability or resolution of this finding. Electronically Signed   By: Vinnie Langton M.D.   On: 10/09/2019 17:00    DG Chest 2 View  Result Date: 10/09/2019 CLINICAL DATA:  64 year old male history of cough, congestion and shortness of breath.  EXAM: CHEST - 2 VIEW COMPARISON:  Chest x-ray 07/01/2019. FINDINGS: Lung volumes are increased with emphysematous changes. Small 5 mm nodular density projecting over the right mid lung. No other larger more suspicious appearing pulmonary nodules or masses are noted. No acute consolidative airspace disease. No pleural effusions. No evidence of pulmonary edema. No pneumothorax. Heart size is normal. Upper mediastinal contours are within normal limits. Aortic atherosclerosis. IMPRESSION: 1. No radiographic evidence of acute cardiopulmonary disease. 2. Emphysema. 3. Aortic atherosclerosis. 4. New 5 mm nodular density projecting over the right mid lung seen only on the frontal projection. Repeat standing PA and lateral chest radiograph is recommended in the next 2-3 months to ensure the stability or resolution of this finding. Electronically Signed   By: Vinnie Langton M.D.   On: 10/09/2019 17:00      Assessment and Plan: Patient Active Problem List   Diagnosis Date Noted  . Dorsalgia 09/11/2017  . Occlusion and stenosis of bilateral carotid arteries 09/11/2017  . Pulmonic heart disease ([place]) 09/11/2017  . COPD with hypoxia ([place]) 08/14/2017  . Chronic a-fib ([place]) 03/27/2015  . CAD (coronary artery disease) 03/02/2015  . 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  . Essential hypertension with goal blood pressure less than 130/80 02/23/2015   1. COPD with hypoxia ([place]) Severe disease, continue to use medications as discussed.   2. Chronic respiratory failure with hypoxia (HCC) Continue to use oxygen and inhalers as needed.  3. Supplemental oxygen dependent Stable, continue to use oxygen 2-3 lpm as needed.   4. Aortic atherosclerosis ([place]) On CT.   5. Essential hypertension with goal blood pressure less than 130/80 Stable, continue present mgmt  6. Pulmonary nodule Will get follow up CXR per recommendations before next visit.  - DG Chest 2 View; Future   General Counseling: I have discussed  the findings of the evaluation and examination with Rhythm.  I have also discussed any further diagnostic evaluation thatmay be needed or ordered today. Yiannis verbalizes understanding of the findings of todays visit. We also reviewed his medications today and discussed drug interactions and side effects including but not limited excessive drowsiness and altered mental states. We also discussed that there is always a risk not just to him but also people around him. he has been encouraged to call the office with any questions or concerns that should arise related to todays visit.  No orders of the defined types were placed in this encounter.    Time spent: 25 This patient was seen by Orson Gear AGNP-C in Collaboration with Dr. Devona Konig  as a part of collaborative care agreement.   I have personally obtained a history, examined the patient, evaluated laboratory and imaging results, formulated the assessment and plan and placed orders.    Allyne Gee, MD [facility] Pulmonary and Critical Care Sleep medicine

## 2019-10-14 ENCOUNTER — Ambulatory Visit: Payer: Medicare Other | Admitting: Internal Medicine

## 2019-10-17 DIAGNOSIS — J449 Chronic obstructive pulmonary disease, unspecified: Secondary | ICD-10-CM | POA: Diagnosis not present

## 2019-10-17 DIAGNOSIS — J439 Emphysema, unspecified: Secondary | ICD-10-CM | POA: Diagnosis not present

## 2019-10-20 NOTE — Procedures (Signed)
[facility] [address] [place], [address]  DATE OF SERVICE: October 09, 2019  Complete Pulmonary Function Testing Interpretation:  FINDINGS:  Forced vital capacity is moderately decreased.  The FEV1 is severely decreased measured at 0.95 L.  FEV1 FVC ratio is severely decreased.  Postbronchodilator there is no significant change in the FEV1 clinical improvement may still occur in absence of spirometric improvement.  Total lung capacity is normal.  Residual volume is increased residual in total lung capacity ratio is increased FRC is normal  IMPRESSION:  This pulmonary function study is consistent with very severe obstructive lung disease.  Yevonne Pax, MD [facility] Pulmonary Critical Care Medicine Sleep Medicine

## 2019-12-10 ENCOUNTER — Telehealth: Payer: Self-pay

## 2019-12-10 NOTE — Telephone Encounter (Signed)
Confirmed appointment on 12/12/2019 and screened for covid. klh 

## 2019-12-11 ENCOUNTER — Other Ambulatory Visit: Payer: Self-pay

## 2019-12-11 ENCOUNTER — Ambulatory Visit
Admission: RE | Admit: 2019-12-11 | Discharge: 2019-12-11 | Disposition: A | Discharge status: Home / Self Care | Payer: Medicare Other | Attending: Adult Health | Admitting: Adult Health

## 2019-12-11 ENCOUNTER — Ambulatory Visit
Admission: RE | Admit: 2019-12-11 | Discharge: 2019-12-11 | Disposition: A | Discharge status: Home / Self Care | Payer: Medicare Other | Source: Ambulatory Visit | Attending: Adult Health | Admitting: Adult Health

## 2019-12-11 DIAGNOSIS — J439 Emphysema, unspecified: Secondary | ICD-10-CM | POA: Diagnosis not present

## 2019-12-11 DIAGNOSIS — R911 Solitary pulmonary nodule: Secondary | ICD-10-CM | POA: Diagnosis not present

## 2019-12-11 DIAGNOSIS — R9389 Abnormal findings on diagnostic imaging of other specified body structures: Secondary | ICD-10-CM | POA: Insufficient documentation

## 2019-12-12 ENCOUNTER — Ambulatory Visit: Payer: Medicare Other | Admitting: Internal Medicine

## 2019-12-12 ENCOUNTER — Encounter: Payer: Self-pay | Admitting: Internal Medicine

## 2019-12-12 VITALS — BP 154/90 | HR 50 | Temp 97.3°F | Resp 16 | Ht 70.0 in | Wt 238.0 lb

## 2019-12-12 DIAGNOSIS — I10 Essential (primary) hypertension: Secondary | ICD-10-CM

## 2019-12-12 DIAGNOSIS — R0902 Hypoxemia: Secondary | ICD-10-CM

## 2019-12-12 DIAGNOSIS — R911 Solitary pulmonary nodule: Secondary | ICD-10-CM | POA: Diagnosis not present

## 2019-12-12 DIAGNOSIS — J9611 Chronic respiratory failure with hypoxia: Secondary | ICD-10-CM

## 2019-12-12 DIAGNOSIS — J449 Chronic obstructive pulmonary disease, unspecified: Secondary | ICD-10-CM | POA: Diagnosis not present

## 2019-12-12 DIAGNOSIS — Z9981 Dependence on supplemental oxygen: Secondary | ICD-10-CM

## 2019-12-12 NOTE — Progress Notes (Signed)
[facility] [place], [place] [address]  Pulmonary Sleep Medicine   Office Visit Note  Patient Name: Erik [name] DOB: Jun 29, 1956 MRN [id number]  Date of Service: 12/12/2019  Complaints/HPI: Pt is here for pulmonary follow up.  He had a follow up CXR for a nodule that was found in march.  This most recent x-ray the nodule is no longer present.  He reports his breathing has been a little better.  He continue to use oxygen at night while sleeping on 2-3 lpm. He is using nebulizer treatments as needed.   ROS  General: (-) fever, (-) chills, (-) night sweats, (-) weakness Skin: (-) rashes, (-) itching,. Eyes: (-) visual changes, (-) redness, (-) itching. Nose and Sinuses: (-) nasal stuffiness or itchiness, (-) postnasal drip, (-) nosebleeds, (-) sinus trouble. Mouth and Throat: (-) sore throat, (-) hoarseness. Neck: (-) swollen glands, (-) enlarged thyroid, (-) neck pain. Respiratory: - cough, (-) bloody sputum, - shortness of breath, - wheezing. Cardiovascular: - ankle swelling, (-) chest pain. Lymphatic: (-) lymph node enlargement. Neurologic: (-) numbness, (-) tingling. Psychiatric: (-) anxiety, (-) depression   Current Medication: Outpatient Encounter Medications as of 12/12/2019  Medication Sig  . albuterol-ipratropium (COMBIVENT) 18-103 MCG/ACT inhaler Inhale 2 puffs into the lungs every 4 (four) hours.  [name] aspirin 325 MG tablet Take 325 mg by mouth daily.  [name] diltiazem (CARDIZEM CD) 180 MG 24 hr capsule Take 1 capsule (180 mg total) by mouth daily.  [name] diltiazem (CARTIA XT) 120 MG 24 hr capsule TAKE ONE CAPSULE BY MOUTH EVERY DAY  . furosemide (LASIX) 20 MG tablet Take 20 mg by mouth daily.  [name] ipratropium-albuterol (DUONEB) 0.5-2.5 (3) MG/3ML SOLN USE 1 VIAL VIA NEBULIZER EVERY 6 HOURS  . isosorbide mononitrate (IMDUR) 30 MG 24 hr tablet Take 1 tablet by mouth daily. Pt states he takes 15mg   . loratadine (CLARITIN) 10 MG tablet Take 10 mg by mouth  daily.  . mometasone-formoterol (DULERA) 200-5 MCG/ACT AERO Inhale 2 Inhalers into the lungs 2 (two) times daily.  . OXYGEN Inhale into the lungs.  . predniSONE (DELTASONE) 10 MG tablet Take one tab 3 x day for 3 days, then take one tab 2 x a day for 3 days and then take one tab a day for 3 days for copd   No facility-administered encounter medications on file as of 12/12/2019.    Surgical History: Past Surgical History:  Procedure Laterality Date  . ANGIOPLASTY  2009  . CARDIAC CATHETERIZATION N/A 03/18/2015   Procedure: Left Heart Cath with Coronary Angiography;  Surgeon: Corey Skains, MD;  Location: [facility];  Service: Cardiovascular;  Laterality: N/A;  . CHOLECYSTECTOMY  2005  . CORONARY ANGIOGRAM  2009    Medical History: Past Medical History:  Diagnosis Date  . Arthritis   . Asthma   . Atrial fibrillation ([place])   . CHF (congestive heart failure) ([place])   . COPD (chronic obstructive pulmonary disease) ([place])   . Coronary artery disease   . GERD (gastroesophageal reflux disease)   . Hypertension   . Myocardial infarction ([place])   . Shortness of breath dyspnea     Family History: Family History  Problem Relation Age of Onset  . Coronary artery disease Father   . Diabetes Father   . Hyperlipidemia Father   . Hypertension Father     Social History: Social History   Socioeconomic History  . Marital status: Married    Spouse name: Not  on file  . Number of children: Not on file  . Years of education: Not on file  . Highest education level: Not on file  Occupational History  . Not on file  Tobacco Use  . Smoking status: Former Smoker    Years: 25.00    Quit date: 03/20/1997    Years since quitting: 22.7  . Smokeless tobacco: Never Used  Substance and Sexual Activity  . Alcohol use: No  . Drug use: No  . Sexual activity: Not on file  Other Topics Concern  . Not on file  Social History Narrative  . Not on file   Social Determinants of Health    Financial Resource Strain:   . Difficulty of Paying Living Expenses:   Food Insecurity:   . Worried About Programme researcher, broadcasting/film/video in the Last Year:   . Barista in the Last Year:   Transportation Needs:   . Freight forwarder (Medical):   [name] Lack of Transportation (Non-Medical):   Physical Activity:   . Days of Exercise per Week:   . Minutes of Exercise per Session:   Stress:   . Feeling of Stress :   Social Connections:   . Frequency of Communication with Friends and Family:   . Frequency of Social Gatherings with Friends and Family:   . Attends Religious Services:   . Active Member of Clubs or Organizations:   . Attends Banker Meetings:   [name] Marital Status:   Intimate Partner Violence:   . Fear of Current or Ex-Partner:   . Emotionally Abused:   [name] Physically Abused:   . Sexually Abused:     Vital Signs: Blood pressure (!) 154/90, pulse (!) 50, temperature (!) 97.3 F (36.3 C), resp. rate 16, height 5\' 10"  (1.778 m), weight 238 lb (108 kg), SpO2 90 %.  Examination: General Appearance: The patient is well-developed, well-nourished, and in no distress. Skin: Gross inspection of skin unremarkable. Head: normocephalic, no gross deformities. Eyes: no gross deformities noted. ENT: ears appear grossly normal no exudates. Neck: Supple. No thyromegaly. No LAD. Respiratory: course breath sounds in bases Cardiovascular: Normal S1 and S2 without murmur or rub. Extremities: No cyanosis. pulses are equal. Neurologic: Alert and oriented. No involuntary movements.  LABS: No results found for this or any previous visit (from the past 2160 hour(s)).  Radiology: DG Chest 2 View  Result Date: 12/11/2019 CLINICAL DATA:  Nodule surveillance EXAM: CHEST - 2 VIEW COMPARISON:  10/09/2019, 07/01/2019 FINDINGS: Hyperinflation with emphysematous disease. No acute consolidation or effusion. Mild bronchitic changes. The previously noted right mid lung nodular opacity is not  clearly identified on today's study. Stable cardiomediastinal silhouette with aortic atherosclerosis. No pneumothorax. IMPRESSION: 1. The previously noted small nodular opacity is not clearly identified today 2. Emphysema with bronchitic changes Electronically Signed   By: 07/03/2019 M.D.   On: 12/11/2019 23:26    DG Chest 2 View  Result Date: 12/11/2019 CLINICAL DATA:  Nodule surveillance EXAM: CHEST - 2 VIEW COMPARISON:  10/09/2019, 07/01/2019 FINDINGS: Hyperinflation with emphysematous disease. No acute consolidation or effusion. Mild bronchitic changes. The previously noted right mid lung nodular opacity is not clearly identified on today's study. Stable cardiomediastinal silhouette with aortic atherosclerosis. No pneumothorax. IMPRESSION: 1. The previously noted small nodular opacity is not clearly identified today 2. Emphysema with bronchitic changes Electronically Signed   By: 07/03/2019 M.D.   On: 12/11/2019 23:26    DG Chest 2 View  Result  Date: 12/11/2019 CLINICAL DATA:  Nodule surveillance EXAM: CHEST - 2 VIEW COMPARISON:  10/09/2019, 07/01/2019 FINDINGS: Hyperinflation with emphysematous disease. No acute consolidation or effusion. Mild bronchitic changes. The previously noted right mid lung nodular opacity is not clearly identified on today's study. Stable cardiomediastinal silhouette with aortic atherosclerosis. No pneumothorax. IMPRESSION: 1. The previously noted small nodular opacity is not clearly identified today 2. Emphysema with bronchitic changes Electronically Signed   By: Jasmine Pang M.D.   On: 12/11/2019 23:26      Assessment and Plan: Patient Active Problem List   Diagnosis Date Noted  . Dorsalgia 09/11/2017  . Occlusion and stenosis of bilateral carotid arteries 09/11/2017  . Pulmonic heart disease (HCC) 09/11/2017  . COPD with hypoxia (HCC) 08/14/2017  . Chronic a-fib (HCC) 03/27/2015  . CAD (coronary artery disease) 03/02/2015  . Combined hyperlipidemia  02/23/2015  . Essential hypertension with goal blood pressure less than 130/80 02/23/2015    1. COPD with hypoxia (HCC) Severe disease, Continue with inhalers and nebulizers as prescribed.   2. Chronic respiratory failure with hypoxia (HCC) Severe disease, continue to use supplemental Oxygen.   3. Supplemental oxygen dependent Continue to use oxygen 2-3 LPM as prescribed.   4. Essential hypertension with goal blood pressure less than 130/80 BP 154/90, continue to monitor.  5. Pulmonary nodule Most recent CXR pulmonary nodule no longer visible.  Will continue to monitor in the coming months. Patient is agreeable to plan.   General Counseling: I have discussed the findings of the evaluation and examination with Jimmie.  I have also discussed any further diagnostic evaluation thatmay be needed or ordered today. Alix verbalizes understanding of the findings of todays visit. We also reviewed his medications today and discussed drug interactions and side effects including but not limited excessive drowsiness and altered mental states. We also discussed that there is always a risk not just to him but also people around him. he has been encouraged to call the office with any questions or concerns that should arise related to todays visit.  No orders of the defined types were placed in this encounter.    Time spent: 30 This patient was seen by Blima Ledger AGNP-C in Collaboration with Dr. Freda Munro as a part of collaborative care agreement.   I have personally obtained a history, examined the patient, evaluated laboratory and imaging results, formulated the assessment and plan and placed orders.    Yevonne Pax, MD [facility] Pulmonary and Critical Care Sleep medicine

## 2020-02-17 ENCOUNTER — Telehealth: Payer: Self-pay

## 2020-02-17 NOTE — Telephone Encounter (Signed)
Confirmed appointment on 02/19/2020. klh 

## 2020-02-19 ENCOUNTER — Ambulatory Visit (INDEPENDENT_AMBULATORY_CARE_PROVIDER_SITE_OTHER): Payer: Medicare Other | Admitting: Adult Health

## 2020-02-19 ENCOUNTER — Encounter: Payer: Self-pay | Admitting: Adult Health

## 2020-02-19 ENCOUNTER — Other Ambulatory Visit: Payer: Self-pay

## 2020-02-19 VITALS — BP 124/86 | HR 95 | Temp 97.5°F | Resp 16 | Ht 69.0 in | Wt 240.8 lb

## 2020-02-19 DIAGNOSIS — Z9981 Dependence on supplemental oxygen: Secondary | ICD-10-CM | POA: Diagnosis not present

## 2020-02-19 DIAGNOSIS — R0609 Other forms of dyspnea: Secondary | ICD-10-CM

## 2020-02-19 DIAGNOSIS — Z0001 Encounter for general adult medical examination with abnormal findings: Secondary | ICD-10-CM | POA: Diagnosis not present

## 2020-02-19 DIAGNOSIS — E0789 Other specified disorders of thyroid: Secondary | ICD-10-CM | POA: Diagnosis not present

## 2020-02-19 DIAGNOSIS — Z125 Encounter for screening for malignant neoplasm of prostate: Secondary | ICD-10-CM

## 2020-02-19 DIAGNOSIS — J449 Chronic obstructive pulmonary disease, unspecified: Secondary | ICD-10-CM

## 2020-02-19 DIAGNOSIS — I10 Essential (primary) hypertension: Secondary | ICD-10-CM | POA: Diagnosis not present

## 2020-02-19 DIAGNOSIS — R300 Dysuria: Secondary | ICD-10-CM

## 2020-02-19 DIAGNOSIS — E782 Mixed hyperlipidemia: Secondary | ICD-10-CM

## 2020-02-19 DIAGNOSIS — J9611 Chronic respiratory failure with hypoxia: Secondary | ICD-10-CM | POA: Diagnosis not present

## 2020-02-19 DIAGNOSIS — I482 Chronic atrial fibrillation, unspecified: Secondary | ICD-10-CM

## 2020-02-19 DIAGNOSIS — R0902 Hypoxemia: Secondary | ICD-10-CM

## 2020-02-19 DIAGNOSIS — I700 Atherosclerosis of aorta: Secondary | ICD-10-CM

## 2020-02-19 DIAGNOSIS — R0600 Dyspnea, unspecified: Secondary | ICD-10-CM

## 2020-02-19 NOTE — Progress Notes (Signed)
[facility] [address] [place], [place] [address]  Internal MEDICINE  Office Visit Note  Patient Name: Erik [name]  [id number]  [id number]  Date of Service: 02/19/2020  Chief Complaint  Patient presents with  . Medicare Wellness     HPI Pt is here for routine health maintenance examination.  He is a 64 yo frail appearing patient.  He has a history of copd, chronic respiratory failure with oxygen dependence, a -fib, HLD, and HTN.  His BP is well controlled today.  He is due for labs to check his lipid panel.  He continues to use inhalers, oxygen and nebulizers for his copd.  He has severe disease.   Current Medication: Outpatient Encounter Medications as of 02/19/2020  Medication Sig  . albuterol-ipratropium (COMBIVENT) 18-103 MCG/ACT inhaler Inhale 2 puffs into the lungs every 4 (four) hours.  [name] aspirin 325 MG tablet Take 325 mg by mouth daily.  [name] diltiazem (CARDIZEM CD) 180 MG 24 hr capsule Take 1 capsule (180 mg total) by mouth daily.  [name] diltiazem (CARTIA XT) 120 MG 24 hr capsule TAKE ONE CAPSULE BY MOUTH EVERY DAY  . furosemide (LASIX) 20 MG tablet Take 20 mg by mouth daily.  [name] ipratropium-albuterol (DUONEB) 0.5-2.5 (3) MG/3ML SOLN USE 1 VIAL VIA NEBULIZER EVERY 6 HOURS  . isosorbide mononitrate (IMDUR) 30 MG 24 hr tablet Take 1 tablet by mouth daily. Pt states he takes 15mg   . loratadine (CLARITIN) 10 MG tablet Take 10 mg by mouth daily.  . mometasone-formoterol (DULERA) 200-5 MCG/ACT AERO Inhale 2 Inhalers into the lungs 2 (two) times daily.  . OXYGEN Inhale into the lungs.  . predniSONE (DELTASONE) 10 MG tablet Take one tab 3 x day for 3 days, then take one tab 2 x a day for 3 days and then take one tab a day for 3 days for copd   No facility-administered encounter medications on file as of 02/19/2020.    Surgical History: Past Surgical History:  Procedure Laterality Date  . ANGIOPLASTY  2009  . CARDIAC CATHETERIZATION N/A 03/18/2015   Procedure: Left  Heart Cath with Coronary Angiography;  Surgeon: 05/18/2015, MD;  Location: [facility];  Service: Cardiovascular;  Laterality: N/A;  . CHOLECYSTECTOMY  2005  . CORONARY ANGIOGRAM  2009    Medical History: Past Medical History:  Diagnosis Date  . Arthritis   . Asthma   . Atrial fibrillation (HCC)   . CHF (congestive heart failure) (HCC)   . COPD (chronic obstructive pulmonary disease) (HCC)   . Coronary artery disease   . GERD (gastroesophageal reflux disease)   . Hypertension   . Myocardial infarction (HCC)   . Shortness of breath dyspnea     Family History: Family History  Problem Relation Age of Onset  . Coronary artery disease Father   . Diabetes Father   . Hyperlipidemia Father   . Hypertension Father       Review of Systems  Constitutional: Negative.  Negative for chills, fatigue and unexpected weight change.  HENT: Negative.  Negative for congestion, rhinorrhea, sneezing and sore throat.   Eyes: Negative for redness.  Respiratory: Negative.  Negative for cough, chest tightness and shortness of breath.   Cardiovascular: Negative.  Negative for chest pain and palpitations.  Gastrointestinal: Negative.  Negative for abdominal pain, constipation, diarrhea, nausea and vomiting.  Endocrine: Negative.   Genitourinary: Negative.  Negative for dysuria and frequency.  Musculoskeletal: Negative.  Negative for arthralgias, back pain, joint  swelling and neck pain.  Skin: Negative.  Negative for rash.  Allergic/Immunologic: Negative.   Neurological: Negative.  Negative for tremors and numbness.  Hematological: Negative for adenopathy. Does not bruise/bleed easily.  Psychiatric/Behavioral: Negative.  Negative for behavioral problems, sleep disturbance and suicidal ideas. The patient is not nervous/anxious.      Vital Signs: BP 124/86   Pulse 95   Temp (!) 97.5 F (36.4 C)   Resp 16   Ht 5\' 9"  (1.753 m)   Wt 240 lb 12.8 oz (109.2 kg)   SpO2 96%   BMI  35.56 kg/m    Physical Exam Vitals and nursing note reviewed.  Constitutional:      General: He is not in acute distress.    Appearance: He is well-developed. He is not diaphoretic.  HENT:     Head: Normocephalic and atraumatic.     Mouth/Throat:     Pharynx: No oropharyngeal exudate.  Eyes:     Pupils: Pupils are equal, round, and reactive to light.  Neck:     Thyroid: No thyromegaly.     Vascular: No JVD.     Trachea: No tracheal deviation.  Cardiovascular:     Rate and Rhythm: Normal rate and regular rhythm.     Heart sounds: Normal heart sounds. No murmur heard.  No friction rub. No gallop.   Pulmonary:     Effort: Pulmonary effort is normal. No respiratory distress.     Breath sounds: Normal breath sounds. No wheezing or rales.  Chest:     Chest wall: No tenderness.  Abdominal:     Palpations: Abdomen is soft.     Tenderness: There is no abdominal tenderness. There is no guarding.  Musculoskeletal:        General: Normal range of motion.     Cervical back: Normal range of motion and neck supple.  Lymphadenopathy:     Cervical: No cervical adenopathy.  Skin:    General: Skin is warm and dry.  Neurological:     Mental Status: He is alert and oriented to person, place, and time.     Cranial Nerves: No cranial nerve deficit.  Psychiatric:        Behavior: Behavior normal.        Thought Content: Thought content normal.        Judgment: Judgment normal.      LABS: No results found for this or any previous visit (from the past 2160 hour(s)).  Assessment/Plan: 1. Encounter for general adult medical examination with abnormal findings Up to date on pHM. - CBC with Differential/Platelet - Lipid Panel With LDL/HDL Ratio - TSH - T4, free - Comprehensive metabolic panel  2. Essential hypertension with goal blood pressure less than 130/80 BP well controlled, continue present therapy.   3. Chronic respiratory failure with hypoxia (HCC) Continue to use oxygen and  resp medications as discussed.   4. Supplemental oxygen dependent Continue with oxygen as prescribed.   5. COPD with hypoxia (HCC) Continue to use oxygen, inhalers and nebulizers as discussed.   6. Dyspnea on exertion Will get Echo to eval heart function.  - ECHOCARDIOGRAM COMPLETE; Future  7. Combined hyperlipidemia Lipid panel WNL, continue to monitor, continue current therapy.   8. Screening for prostate cancer - PSA  9. Chronic a-fib [facility]) Continue with cardiology management.   10. Aortic atherosclerosis (HCC) Continue with Aspirin and follow up with cardiology as directed.   11. Dysuria - Urinalysis, Routine w reflex microscopic  General Counseling:  Jamai verbalizes understanding of the findings of todays visit and agrees with plan of treatment. I have discussed any further diagnostic evaluation that may be needed or ordered today. We also reviewed his medications today. he has been encouraged to call the office with any questions or concerns that should arise related to todays visit.   No orders of the defined types were placed in this encounter.   No orders of the defined types were placed in this encounter.   Time spent:30 Minutes   This patient was seen by Blima Ledger AGNP-C in Collaboration with Dr Lyndon Code as a part of collaborative care agreement    Johnna Acosta AGNP-C Internal Medicine

## 2020-02-20 LAB — LIPID PANEL WITH LDL/HDL RATIO
Cholesterol, Total: 184 mg/dL (ref 100–199)
HDL: 49 mg/dL (ref >39)
LDL Chol Calc (NIH): 115 mg/dL — ABNORMAL HIGH (ref 0–99)
LDL/HDL Ratio: 2.3 ratio (ref 0.0–3.6)
Triglycerides: 110 mg/dL (ref 0–149)
VLDL Cholesterol Cal: 20 mg/dL (ref 5–40)

## 2020-02-20 LAB — COMPREHENSIVE METABOLIC PANEL
ALT: 12 IU/L (ref 0–44)
AST: 14 IU/L (ref 0–40)
Albumin/Globulin Ratio: 2 (ref 1.2–2.2)
Albumin: 4.2 g/dL (ref 3.8–4.8)
Alkaline Phosphatase: 81 IU/L (ref 48–121)
BUN/Creatinine Ratio: 16 (ref 10–24)
BUN: 14 mg/dL (ref 8–27)
Bilirubin Total: 0.4 mg/dL (ref 0.0–1.2)
CO2: 24 mmol/L (ref 20–29)
Calcium: 9.1 mg/dL (ref 8.6–10.2)
Chloride: 100 mmol/L (ref 96–106)
Creatinine, Ser: 0.87 mg/dL (ref 0.76–1.27)
GFR calc Af Amer: 105 mL/min/{1.73_m2} (ref >59)
GFR calc non Af Amer: 91 mL/min/{1.73_m2} (ref >59)
Globulin, Total: 2.1 g/dL (ref 1.5–4.5)
Glucose: 106 mg/dL — ABNORMAL HIGH (ref 65–99)
Potassium: 4 mmol/L (ref 3.5–5.2)
Sodium: 138 mmol/L (ref 134–144)
Total Protein: 6.3 g/dL (ref 6.0–8.5)

## 2020-02-20 LAB — T4, FREE: Free T4: 1.39 ng/dL (ref 0.82–1.77)

## 2020-02-20 LAB — URINALYSIS, ROUTINE W REFLEX MICROSCOPIC
Bilirubin, UA: NEGATIVE
Glucose, UA: NEGATIVE
Ketones, UA: NEGATIVE
Leukocytes,UA: NEGATIVE
Nitrite, UA: NEGATIVE
Protein,UA: NEGATIVE
RBC, UA: NEGATIVE
Specific Gravity, UA: 1.007 (ref 1.005–1.030)
Urobilinogen, Ur: 0.2 mg/dL (ref 0.2–1.0)
pH, UA: 6.5 (ref 5.0–7.5)

## 2020-02-20 LAB — CBC WITH DIFFERENTIAL/PLATELET
Basophils Absolute: 0 10*3/uL (ref 0.0–0.2)
Basos: 1 %
EOS (ABSOLUTE): 0.2 10*3/uL (ref 0.0–0.4)
Eos: 2 %
Hematocrit: 45.6 % (ref 37.5–51.0)
Hemoglobin: 15.1 g/dL (ref 13.0–17.7)
Immature Grans (Abs): 0 10*3/uL (ref 0.0–0.1)
Immature Granulocytes: 0 %
Lymphocytes Absolute: 2.2 10*3/uL (ref 0.7–3.1)
Lymphs: 26 %
MCH: 29.8 pg (ref 26.6–33.0)
MCHC: 33.1 g/dL (ref 31.5–35.7)
MCV: 90 fL (ref 79–97)
Monocytes Absolute: 1.2 10*3/uL — ABNORMAL HIGH (ref 0.1–0.9)
Monocytes: 14 %
Neutrophils Absolute: 4.8 10*3/uL (ref 1.4–7.0)
Neutrophils: 57 %
Platelets: 345 10*3/uL (ref 150–450)
RBC: 5.07 x10E6/uL (ref 4.14–5.80)
RDW: 12.9 % (ref 11.6–15.4)
WBC: 8.5 10*3/uL (ref 3.4–10.8)

## 2020-02-20 LAB — PSA: Prostate Specific Ag, Serum: 0.4 ng/mL (ref 0.0–4.0)

## 2020-02-20 LAB — TSH: TSH: 1.38 u[IU]/mL (ref 0.450–4.500)

## 2020-03-11 ENCOUNTER — Telehealth: Payer: Self-pay

## 2020-03-11 NOTE — Telephone Encounter (Signed)
Confirmed and screened for office visit 8/6 

## 2020-03-12 ENCOUNTER — Ambulatory Visit (INDEPENDENT_AMBULATORY_CARE_PROVIDER_SITE_OTHER): Payer: Medicare Other | Admitting: Internal Medicine

## 2020-03-12 ENCOUNTER — Other Ambulatory Visit: Payer: Self-pay

## 2020-03-12 ENCOUNTER — Encounter: Payer: Self-pay | Admitting: Internal Medicine

## 2020-03-12 VITALS — BP 132/70 | HR 97 | Temp 97.8°F | Resp 16 | Ht 70.0 in | Wt 240.0 lb

## 2020-03-12 DIAGNOSIS — J449 Chronic obstructive pulmonary disease, unspecified: Secondary | ICD-10-CM

## 2020-03-12 DIAGNOSIS — J9611 Chronic respiratory failure with hypoxia: Secondary | ICD-10-CM

## 2020-03-12 DIAGNOSIS — R0602 Shortness of breath: Secondary | ICD-10-CM | POA: Diagnosis not present

## 2020-03-12 DIAGNOSIS — Z9981 Dependence on supplemental oxygen: Secondary | ICD-10-CM | POA: Diagnosis not present

## 2020-03-12 DIAGNOSIS — R0902 Hypoxemia: Secondary | ICD-10-CM

## 2020-03-12 DIAGNOSIS — M25552 Pain in left hip: Secondary | ICD-10-CM

## 2020-03-12 DIAGNOSIS — G8929 Other chronic pain: Secondary | ICD-10-CM

## 2020-03-12 DIAGNOSIS — M25551 Pain in right hip: Secondary | ICD-10-CM | POA: Diagnosis not present

## 2020-03-12 MED ORDER — MELOXICAM 7.5 MG PO TABS: 7.5000 mg | ORAL_TABLET | Freq: Every day | ORAL | 0 refills | Status: DC

## 2020-03-12 NOTE — Progress Notes (Signed)
[facility] [place], [place] [address]  Pulmonary Sleep Medicine   Office Visit Note  Patient Name: Erik [name] DOB: [birth year] MRN [id number]  Date of Service: 03/12/2020  Complaints/HPI: Pt is here for pulmonary follow up. He continues on oxygen at night 2-3 lpm.  He reports his breathing has been pretty good over the last 3 months. He is using his nebulizer as needed.  He has needed it less recently. He uses Trelegy inhaler daily, with excellent results.   ROS  General: (-) fever, (-) chills, (-) night sweats, (-) weakness Skin: (-) rashes, (-) itching,. Eyes: (-) visual changes, (-) redness, (-) itching. Nose and Sinuses: (-) nasal stuffiness or itchiness, (-) postnasal drip, (-) nosebleeds, (-) sinus trouble. Mouth and Throat: (-) sore throat, (-) hoarseness. Neck: (-) swollen glands, (-) enlarged thyroid, (-) neck pain. Respiratory: - cough, (-) bloody sputum, + shortness of breath, - wheezing. Cardiovascular: - ankle swelling, (-) chest pain. Lymphatic: (-) lymph node enlargement. Neurologic: (-) numbness, (-) tingling. Psychiatric: (-) anxiety, (-) depression   Current Medication: Outpatient Encounter Medications as of 03/12/2020  Medication Sig  . albuterol-ipratropium (COMBIVENT) 18-103 MCG/ACT inhaler Inhale 2 puffs into the lungs every 4 (four) hours.  [name] aspirin 325 MG tablet Take 325 mg by mouth daily.  [name] diltiazem (CARDIZEM CD) 180 MG 24 hr capsule Take 1 capsule (180 mg total) by mouth daily.  [name] diltiazem (CARTIA XT) 120 MG 24 hr capsule TAKE ONE CAPSULE BY MOUTH EVERY DAY  . furosemide (LASIX) 20 MG tablet Take 20 mg by mouth daily.  [name] ipratropium-albuterol (DUONEB) 0.5-2.5 (3) MG/3ML SOLN USE 1 VIAL VIA NEBULIZER EVERY 6 HOURS  . isosorbide mononitrate (IMDUR) 30 MG 24 hr tablet Take 1 tablet by mouth daily. Pt states he takes 63m  . loratadine (CLARITIN) 10 MG tablet Take 10 mg by mouth daily.  . mometasone-formoterol (DULERA)  200-5 MCG/ACT AERO Inhale 2 Inhalers into the lungs 2 (two) times daily.  . OXYGEN Inhale into the lungs.  . predniSONE (DELTASONE) 10 MG tablet Take one tab 3 x day for 3 days, then take one tab 2 x a day for 3 days and then take one tab a day for 3 days for copd  . TRELEGY ELLIPTA 100-62.5-25 MCG/INH AEPB Inhale 1 puff into the lungs daily.   No facility-administered encounter medications on file as of 03/12/2020.    Surgical History: Past Surgical History:  Procedure Laterality Date  . ANGIOPLASTY  2009  . CARDIAC CATHETERIZATION N/A 03/18/2015   Procedure: Left Heart Cath with Coronary Angiography;  Surgeon: BCorey Skains MD;  [facility];  Service: Cardiovascular;  Laterality: N/A;  . CHOLECYSTECTOMY  2005  . CORONARY ANGIOGRAM  2009    Medical History: Past Medical History:  Diagnosis Date  . Arthritis   . Asthma   . Atrial fibrillation ([place]   . CHF (congestive heart failure) ([place]   . COPD (chronic obstructive pulmonary disease) ([facility]   . Coronary artery disease   . GERD (gastroesophageal reflux disease)   . Hypertension   . Myocardial infarction ([place]   . Shortness of breath dyspnea     Family History: Family History  Problem Relation Age of Onset  . Coronary artery disease Father   . Diabetes Father   . Hyperlipidemia Father   . Hypertension Father     Social History: Social History   Socioeconomic History  . Marital status: Married    Spouse  name: Not on file  . Number of children: Not on file  . Years of education: Not on file  . Highest education level: Not on file  Occupational History  . Not on file  Tobacco Use  . Smoking status: Former Smoker    Years: 25.00    Quit date: 03/20/1997    Years since quitting: 22.9  . Smokeless tobacco: Never Used  Vaping Use  . Vaping Use: Never used  Substance and Sexual Activity  . Alcohol use: No  . Drug use: No  . Sexual activity: Not on file  Other Topics Concern  . Not on file   Social History Narrative  . Not on file   Social Determinants of Health   Financial Resource Strain:   . Difficulty of Paying Living Expenses:   Food Insecurity:   . Worried About Charity fundraiser in the Last Year:   . Arboriculturist in the Last Year:   Transportation Needs:   . Film/video editor (Medical):   [name] Lack of Transportation (Non-Medical):   Physical Activity:   . Days of Exercise per Week:   . Minutes of Exercise per Session:   Stress:   . Feeling of Stress :   Social Connections:   . Frequency of Communication with Friends and Family:   . Frequency of Social Gatherings with Friends and Family:   . Attends Religious Services:   . Active Member of Clubs or Organizations:   . Attends Archivist Meetings:   [name] Marital Status:   Intimate Partner Violence:   . Fear of Current or Ex-Partner:   . Emotionally Abused:   [name] Physically Abused:   . Sexually Abused:     Vital Signs: Blood pressure 132/70, pulse 97, temperature 97.8 F (36.6 C), resp. rate 16, height 5' 10"  (1.778 m), weight 240 lb (108.9 kg), SpO2 97 %.  Examination: General Appearance: The patient is well-developed, well-nourished, and in no distress. Skin: Gross inspection of skin unremarkable. Head: normocephalic, no gross deformities. Eyes: no gross deformities noted. ENT: ears appear grossly normal no exudates. Neck: Supple. No thyromegaly. No LAD. Respiratory: clear bilaterally. Cardiovascular: Normal S1 and S2 without murmur or rub. Extremities: No cyanosis. pulses are equal. Neurologic: Alert and oriented. No involuntary movements.  LABS: Recent Results (from the past 2160 hour(s))  CBC with Differential/Platelet     Status: Abnormal   Collection Time: 02/19/20 12:00 AM  Result Value Ref Range   WBC 8.5 3.4 - 10.8 x10E3/uL   RBC 5.07 4.14 - 5.80 x10E6/uL   Hemoglobin 15.1 13.0 - 17.7 g/dL   Hematocrit 45.6 37.5 - 51.0 %   MCV 90 79 - 97 fL   MCH 29.8 26.6 - 33.0 pg    MCHC 33.1 31 - 35 g/dL   RDW 12.9 11.6 - 15.4 %   Platelets 345 150 - 450 x10E3/uL   Neutrophils 57 Not Estab. %   Lymphs 26 Not Estab. %   Monocytes 14 Not Estab. %   Eos 2 Not Estab. %   Basos 1 Not Estab. %   Neutrophils Absolute 4.8 1 - 7 x10E3/uL   Lymphocytes Absolute 2.2 0 - 3 x10E3/uL   Monocytes Absolute 1.2 (H) 0 - 0 x10E3/uL   EOS (ABSOLUTE) 0.2 0.0 - 0.4 x10E3/uL   Basophils Absolute 0.0 0 - 0 x10E3/uL   Immature Granulocytes 0 Not Estab. %   Immature Grans (Abs) 0.0 0.0 - 0.1 x10E3/uL  Lipid Panel  With LDL/HDL Ratio     Status: Abnormal   Collection Time: 02/19/20 12:00 AM  Result Value Ref Range   Cholesterol, Total 184 100 - 199 mg/dL   Triglycerides 110 0 - 149 mg/dL   HDL 49 >39 mg/dL   VLDL Cholesterol Cal 20 5 - 40 mg/dL   LDL Chol Calc (NIH) 115 (H) 0 - 99 mg/dL   LDL/HDL Ratio 2.3 0.0 - 3.6 ratio    Comment:                                     LDL/HDL Ratio                                             Men  Women                               1/2 Avg.Risk  1.0    1.5                                   Avg.Risk  3.6    3.2                                2X Avg.Risk  6.2    5.0                                3X Avg.Risk  8.0    6.1   TSH     Status: None   Collection Time: 02/19/20 12:00 AM  Result Value Ref Range   TSH 1.380 0.450 - 4.500 uIU/mL  T4, free     Status: None   Collection Time: 02/19/20 12:00 AM  Result Value Ref Range   Free T4 1.39 0.82 - 1.77 ng/dL  Comprehensive metabolic panel     Status: Abnormal   Collection Time: 02/19/20 12:00 AM  Result Value Ref Range   Glucose 106 (H) 65 - 99 mg/dL   BUN 14 8 - 27 mg/dL   Creatinine, Ser 0.87 0.76 - 1.27 mg/dL   GFR calc non Af Amer 91 >59 mL/min/1.73   GFR calc Af Amer 105 >59 mL/min/1.73    Comment: **[facility] currently reports eGFR in compliance with the current**   recommendations of the [employer]. [facility] will   update reporting as new guidelines are published from the  NKF-ASN   Task force.    BUN/Creatinine Ratio 16 10 - 24   Sodium 138 134 - 144 mmol/L   Potassium 4.0 3.5 - 5.2 mmol/L   Chloride 100 96 - 106 mmol/L   CO2 24 20 - 29 mmol/L   Calcium 9.1 8.6 - 10.2 mg/dL   Total Protein 6.3 6.0 - 8.5 g/dL   Albumin 4.2 3.8 - 4.8 g/dL   Globulin, Total 2.1 1.5 - 4.5 g/dL   Albumin/Globulin Ratio 2.0 1.2 - 2.2   Bilirubin Total 0.4 0.0 - 1.2 mg/dL   Alkaline Phosphatase 81 48 - 121 IU/L   AST 14 0 - 40 IU/L   ALT 12  0 - 44 IU/L  PSA     Status: None   Collection Time: 02/19/20 12:00 AM  Result Value Ref Range   Prostate Specific Ag, Serum 0.4 0.0 - 4.0 ng/mL    Comment: Roche ECLIA methodology. According to the American Urological Association, Serum PSA should decrease and remain at undetectable levels after radical prostatectomy. The AUA defines biochemical recurrence as an initial PSA value 0.2 ng/mL or greater followed by a subsequent confirmatory PSA value 0.2 ng/mL or greater. Values obtained with different assay methods or kits cannot be used interchangeably. Results cannot be interpreted as absolute evidence of the presence or absence of malignant disease.   Urinalysis, Routine w reflex microscopic     Status: None   Collection Time: 02/19/20 10:35 AM  Result Value Ref Range   Specific Gravity, UA 1.007 1.005 - 1.030   pH, UA 6.5 5.0 - 7.5   Color, UA Yellow Yellow   Appearance Ur Clear Clear   Leukocytes,UA Negative Negative   Protein,UA Negative Negative/Trace   Glucose, UA Negative Negative   Ketones, UA Negative Negative   RBC, UA Negative Negative   Bilirubin, UA Negative Negative   Urobilinogen, Ur 0.2 0.2 - 1.0 mg/dL   Nitrite, UA Negative Negative   Microscopic Examination Comment     Comment: Microscopic not indicated and not performed.    Radiology: DG Chest 2 View  Result Date: 12/11/2019 CLINICAL DATA:  Nodule surveillance EXAM: CHEST - 2 VIEW COMPARISON:  10/09/2019, 07/01/2019 FINDINGS: Hyperinflation with  emphysematous disease. No acute consolidation or effusion. Mild bronchitic changes. The previously noted right mid lung nodular opacity is not clearly identified on today's study. Stable cardiomediastinal silhouette with aortic atherosclerosis. No pneumothorax. IMPRESSION: 1. The previously noted small nodular opacity is not clearly identified today 2. Emphysema with bronchitic changes Electronically Signed   By: Donavan Foil M.D.   On: 12/11/2019 23:26    No results found.  No results found.    Assessment and Plan: Patient Active Problem List   Diagnosis Date Noted  . Dorsalgia 09/11/2017  . Occlusion and stenosis of bilateral carotid arteries 09/11/2017  . Pulmonic heart disease ([place]) 09/11/2017  . COPD with hypoxia ([place]) 08/14/2017  . Chronic a-fib ([place]) 03/27/2015  . CAD (coronary artery disease) 03/02/2015  . 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  . Essential hypertension with goal blood pressure less than 130/80 02/23/2015    1. COPD with hypoxia ([place]) Slightly improved, continue with oxygen, and inhalers as directed.   2. Supplemental oxygen dependent Continue to use oxygen as directed.   3. Chronic respiratory failure with hypoxia (HCC) At baseline, continue to use nebulizer and inhaler as directed.   4. Chronic pain of both hips Use meloxicam as needed for chronic hip pain. - meloxicam (MOBIC) 7.5 MG tablet; Take 1 tablet (7.5 mg total) by mouth daily.  Dispense: 30 tablet; Refill: 0  5. SOB (shortness of breath) - Spirometry with Graph  General Counseling: I have discussed the findings of the evaluation and examination with Erik [name].  I have also discussed any further diagnostic evaluation thatmay be needed or ordered today. Magic verbalizes understanding of the findings of todays visit. We also reviewed his medications today and discussed drug interactions and side effects including but not limited excessive drowsiness and altered mental states. We also discussed that  there is always a risk not just to him but also people around him. he has been encouraged to call the office with any questions or concerns that should  arise related to todays visit.  Orders Placed This Encounter  Procedures  . Spirometry with Graph    Order Specific Question:   Where should this test be performed?    Answer:   [facility]     Time spent: 30 This patient was seen by Orson Gear AGNP-C in Collaboration with Dr. Devona Konig as a part of collaborative care agreement.   I have personally obtained a history, examined the patient, evaluated laboratory and imaging results, formulated the assessment and plan and placed orders.    Allyne Gee, MD [facility] Pulmonary and Critical Care Sleep medicine

## 2020-03-13 ENCOUNTER — Ambulatory Visit: Payer: Medicare Other

## 2020-03-13 DIAGNOSIS — R0609 Other forms of dyspnea: Secondary | ICD-10-CM

## 2020-03-13 DIAGNOSIS — R0602 Shortness of breath: Secondary | ICD-10-CM | POA: Diagnosis not present

## 2020-03-20 ENCOUNTER — Telehealth: Payer: Self-pay

## 2020-03-20 NOTE — Telephone Encounter (Signed)
Confirmed office visit on 8/17

## 2020-03-24 ENCOUNTER — Ambulatory Visit: Payer: Medicare Other | Admitting: Hospice and Palliative Medicine

## 2020-03-31 ENCOUNTER — Ambulatory Visit (INDEPENDENT_AMBULATORY_CARE_PROVIDER_SITE_OTHER): Payer: Medicare Other | Admitting: Hospice and Palliative Medicine

## 2020-03-31 ENCOUNTER — Encounter: Payer: Self-pay | Admitting: Hospice and Palliative Medicine

## 2020-03-31 DIAGNOSIS — I739 Peripheral vascular disease, unspecified: Secondary | ICD-10-CM | POA: Diagnosis not present

## 2020-03-31 DIAGNOSIS — R600 Localized edema: Secondary | ICD-10-CM

## 2020-03-31 DIAGNOSIS — I5020 Unspecified systolic (congestive) heart failure: Secondary | ICD-10-CM

## 2020-03-31 DIAGNOSIS — R0902 Hypoxemia: Secondary | ICD-10-CM

## 2020-03-31 DIAGNOSIS — J449 Chronic obstructive pulmonary disease, unspecified: Secondary | ICD-10-CM | POA: Diagnosis not present

## 2020-03-31 NOTE — Progress Notes (Signed)
[facility] [address] [place], [place] [address]  Internal MEDICINE  Telephone Visit  Patient Name: Erik [name]   [id number]  [id number]  Date of Service: 03/31/2020  I connected with the patient at 1048 by webcam and verified the patients identity using two identifiers.   I discussed the limitations, risks, security and privacy concerns of performing an evaluation and management service by telephone and the availability of in person appointments. I also discussed with the patient that there may be a patient responsible charge related to the service.  The patient expressed understanding and agrees to proceed.    Chief Complaint  Patient presents with  . Telephone Screen  . Telephone Assessment  . Follow-up    3 week fup review Echo and labs  . Numbness    in fingertips, toes and legs at times for months now but getting worse.  goes and comes  . Tinnitus  . Foot Swelling    right foot bad swelling    HPI Patient is seen today for routine follow-up. We discussed the findings from his recent echo, LVEF 40%. He has not seen a cardiologist in some time he states. He complains of bilateral lower extremity edema that has been ongoing for several months. Recently he reports he notices multiple blisters on bilateral lower extremities in various healing stages. He reports poor healing with blisters. States for several months bilateral hands as well as legs are numb and has tingling sensation. He was prescribed supplemental oxygen in the past but has not been routinely wearing it for several months. Reports he randomly checks his SpO2 levels and they have sustained 90-95% without O2. He has never been tested for OSA. Reports shortness of breath has been well controlled on Trelegy, denies chest pain.  Current Medication: Outpatient Encounter Medications as of 03/31/2020  Medication Sig  . aspirin 325 MG tablet Take 325 mg by mouth daily.  [name] diltiazem (CARDIZEM CD) 180 MG 24  hr capsule Take 1 capsule (180 mg total) by mouth daily.  . furosemide (LASIX) 20 MG tablet Take 20 mg by mouth daily.  [name] ipratropium-albuterol (DUONEB) 0.5-2.5 (3) MG/3ML SOLN USE 1 VIAL VIA NEBULIZER EVERY 6 HOURS  . loratadine (CLARITIN) 10 MG tablet Take 10 mg by mouth daily.  . meloxicam (MOBIC) 7.5 MG tablet Take 1 tablet (7.5 mg total) by mouth daily.  . OXYGEN Inhale into the lungs.  . TRELEGY ELLIPTA 100-62.5-25 MCG/INH AEPB Inhale 1 puff into the lungs daily.  . [DISCONTINUED] albuterol-ipratropium (COMBIVENT) 18-103 MCG/ACT inhaler Inhale 2 puffs into the lungs every 4 (four) hours. (Patient not taking: Reported on 03/31/2020)  . [DISCONTINUED] diltiazem (CARTIA XT) 120 MG 24 hr capsule TAKE ONE CAPSULE BY MOUTH EVERY DAY (Patient not taking: Reported on 03/31/2020)  . [DISCONTINUED] isosorbide mononitrate (IMDUR) 30 MG 24 hr tablet Take 1 tablet by mouth daily. Pt states he takes 15mg  (Patient not taking: Reported on 03/31/2020)  . [DISCONTINUED] mometasone-formoterol (DULERA) 200-5 MCG/ACT AERO Inhale 2 Inhalers into the lungs 2 (two) times daily. (Patient not taking: Reported on 03/31/2020)  . [DISCONTINUED] predniSONE (DELTASONE) 10 MG tablet Take one tab 3 x day for 3 days, then take one tab 2 x a day for 3 days and then take one tab a day for 3 days for copd (Patient not taking: Reported on 03/31/2020)   No facility-administered encounter medications on file as of 03/31/2020.    Surgical History: Past Surgical History:  Procedure Laterality Date  .  ANGIOPLASTY  2009  . CARDIAC CATHETERIZATION N/A 03/18/2015   Procedure: Left Heart Cath with Coronary Angiography;  Surgeon: Lamar Blinks, MD;  Location: [facility] INVASIVE CV LAB;  Service: Cardiovascular;  Laterality: N/A;  . CHOLECYSTECTOMY  2005  . CORONARY ANGIOGRAM  2009    Medical History: Past Medical History:  Diagnosis Date  . Arthritis   . Asthma   . Atrial fibrillation (HCC)   . CHF (congestive heart failure) (HCC)    . COPD (chronic obstructive pulmonary disease) (HCC)   . Coronary artery disease   . GERD (gastroesophageal reflux disease)   . Hypertension   . Myocardial infarction (HCC)   . Shortness of breath dyspnea     Family History: Family History  Problem Relation Age of Onset  . Coronary artery disease Father   . Diabetes Father   . Hyperlipidemia Father   . Hypertension Father     Social History   Socioeconomic History  . Marital status: Married    Spouse name: Not on file  . Number of children: Not on file  . Years of education: Not on file  . Highest education level: Not on file  Occupational History  . Not on file  Tobacco Use  . Smoking status: Former Smoker    Years: 25.00    Quit date: 03/20/1997    Years since quitting: 23.0  . Smokeless tobacco: Never Used  Vaping Use  . Vaping Use: Never used  Substance and Sexual Activity  . Alcohol use: No  . Drug use: No  . Sexual activity: Not on file  Other Topics Concern  . Not on file  Social History Narrative  . Not on file   Social Determinants of Health   Financial Resource Strain:   . Difficulty of Paying Living Expenses: Not on file  Food Insecurity:   . Worried About Programme researcher, broadcasting/film/video in the Last Year: Not on file  . Ran Out of Food in the Last Year: Not on file  Transportation Needs:   . Lack of Transportation (Medical): Not on file  . Lack of Transportation (Non-Medical): Not on file  Physical Activity:   . Days of Exercise per Week: Not on file  . Minutes of Exercise per Session: Not on file  Stress:   . Feeling of Stress : Not on file  Social Connections:   . Frequency of Communication with Friends and Family: Not on file  . Frequency of Social Gatherings with Friends and Family: Not on file  . Attends Religious Services: Not on file  . Active Member of Clubs or Organizations: Not on file  . Attends Banker Meetings: Not on file  . Marital Status: Not on file  Intimate Partner  Violence:   . Fear of Current or Ex-Partner: Not on file  . Emotionally Abused: Not on file  . Physically Abused: Not on file  . Sexually Abused: Not on file      Review of Systems  Constitutional: Negative for chills, fatigue and unexpected weight change.  HENT: Negative for congestion, postnasal drip, rhinorrhea, sneezing and sore throat.   Eyes: Negative for photophobia and visual disturbance.  Respiratory: Negative for cough, chest tightness, shortness of breath and wheezing.   Cardiovascular: Positive for leg swelling. Negative for chest pain and palpitations.       Bilateral lower extremity edema  Gastrointestinal: Negative for abdominal pain, constipation, diarrhea, nausea and vomiting.  Genitourinary: Negative for dysuria and frequency.  Musculoskeletal: Positive  for arthralgias and back pain. Negative for joint swelling and neck pain.  Skin: Positive for wound. Negative for rash.       Blisters on bilateral lower extremities  Neurological: Positive for weakness and numbness. Negative for tremors, light-headedness and headaches.       Numbness and tingling to bilateral hands and lower extremities  Hematological: Negative for adenopathy. Does not bruise/bleed easily.  Psychiatric/Behavioral: Negative for behavioral problems (Depression), sleep disturbance and suicidal ideas. The patient is not nervous/anxious.     Vital Signs: BP 119/74   Pulse 82   Resp 16   Ht 5\' 10"  (1.778 m)   Wt 232 lb (105.2 kg)   BMI 33.29 kg/m    Observation/Objective: No acute distress noted. He was able to turn camera view to his legs, swelling notes, discoloration (errythema) noted, unable to clearly view blisters or wounds to legs.   Assessment/Plan: 1. Heart failure with reduced ejection fraction (HCC) LVEF 40% found on echocardiogram, compared to normal LVEF 55-60% on previous echocardiogram. Refer to cardiology for further management. Continue with furosemide therapy at this time. -  Ambulatory referral to Cardiology  2. Bilateral lower extremity edema Ongoing for several months, has worsened over last few weeks. Swelling not improved with elevation. Numbness and tingling felt in lower extremities. Will need to rule out for DVT, will treat accordingly. - VAS LOWER EXTREMITY VENOUS (DVT); Future  3. Peripheral vascular disease of lower extremity (HCC) Bilateral lower extremity edema with discoloration as well as blisters with poor healing. Rule out DVT, will need to follow-up to assess for peripheral arterial disease. - VAS [place] LOWER EXTREMITY VENOUS (DVT); Future  4. COPD with hypoxia (HCC) Symptoms controlled on Trelegy at this time. Has not been wearing supplemental oxygen for several months, SpO2 stable today. Encouraged to routinely monitor his SpO2 levels at home and wear oxygen if SpO2 88% or below.  General Counseling: Geovanny verbalizes understanding of the findings of today's phone visit and agrees with plan of treatment. I have discussed any further diagnostic evaluation that may be needed or ordered today. We also reviewed his medications today. he has been encouraged to call the office with any questions or concerns that should arise related to todays visit.    Orders Placed This Encounter  Procedures  . Ambulatory referral to Cardiology  . VAS [place] LOWER EXTREMITY VENOUS (DVT)    Time spent: 25 Minutes Time spent includes review of chart, medications, test results and follow-up plan with the patient.  [place] Stasha Naraine AGNP-C Internal medicine

## 2020-04-01 ENCOUNTER — Other Ambulatory Visit: Payer: Self-pay | Admitting: Hospice and Palliative Medicine

## 2020-04-01 DIAGNOSIS — M79605 Pain in left leg: Secondary | ICD-10-CM

## 2020-04-01 DIAGNOSIS — I779 Disorder of arteries and arterioles, unspecified: Secondary | ICD-10-CM

## 2020-04-01 DIAGNOSIS — M79604 Pain in right leg: Secondary | ICD-10-CM

## 2020-04-17 ENCOUNTER — Ambulatory Visit: Payer: Medicare Other

## 2020-04-17 ENCOUNTER — Ambulatory Visit (INDEPENDENT_AMBULATORY_CARE_PROVIDER_SITE_OTHER): Payer: Medicare Other

## 2020-04-17 ENCOUNTER — Other Ambulatory Visit: Payer: Self-pay

## 2020-04-17 DIAGNOSIS — I739 Peripheral vascular disease, unspecified: Secondary | ICD-10-CM

## 2020-04-17 DIAGNOSIS — R2243 Localized swelling, mass and lump, lower limb, bilateral: Secondary | ICD-10-CM | POA: Diagnosis not present

## 2020-04-17 DIAGNOSIS — R600 Localized edema: Secondary | ICD-10-CM

## 2020-04-24 NOTE — Progress Notes (Signed)
[place] normal, negative for DVT. Will discuss further at next follow-up visit.

## 2020-04-29 ENCOUNTER — Telehealth: Payer: Self-pay

## 2020-04-29 NOTE — Telephone Encounter (Signed)
CONFIRMED PT APPT FOR 05/01/20 

## 2020-05-01 ENCOUNTER — Ambulatory Visit: Payer: Medicare Other

## 2020-05-01 ENCOUNTER — Other Ambulatory Visit: Payer: Self-pay

## 2020-05-01 DIAGNOSIS — I779 Disorder of arteries and arterioles, unspecified: Secondary | ICD-10-CM | POA: Diagnosis not present

## 2020-05-04 ENCOUNTER — Ambulatory Visit: Payer: Medicare Other | Admitting: Cardiology

## 2020-05-06 ENCOUNTER — Ambulatory Visit: Payer: Medicare Other | Admitting: Hospice and Palliative Medicine

## 2020-05-07 ENCOUNTER — Telehealth: Payer: Self-pay

## 2020-05-07 ENCOUNTER — Other Ambulatory Visit: Payer: Self-pay | Admitting: Hospice and Palliative Medicine

## 2020-05-07 DIAGNOSIS — I739 Peripheral vascular disease, unspecified: Secondary | ICD-10-CM

## 2020-05-07 NOTE — Telephone Encounter (Signed)
-----   Message from Theotis Burrow, NP sent at 05/07/2020  1:55 PM EDT ----- Please call and let him know based on his [place] results I have referred him to vascular surgery.

## 2020-05-07 NOTE — Telephone Encounter (Signed)
Lmom to reschedule missed appointment for 05-06-20 ov.

## 2020-05-07 NOTE — Telephone Encounter (Signed)
I spoke to the pt and he was told by his heart doctor that there was nothing else they could do for him and no more surgeries he could do.

## 2020-05-14 ENCOUNTER — Ambulatory Visit (INDEPENDENT_AMBULATORY_CARE_PROVIDER_SITE_OTHER): Payer: Medicare Other | Admitting: Hospice and Palliative Medicine

## 2020-05-14 ENCOUNTER — Encounter: Payer: Self-pay | Admitting: Hospice and Palliative Medicine

## 2020-05-14 ENCOUNTER — Other Ambulatory Visit: Payer: Self-pay

## 2020-05-14 DIAGNOSIS — R0902 Hypoxemia: Secondary | ICD-10-CM | POA: Diagnosis not present

## 2020-05-14 DIAGNOSIS — J449 Chronic obstructive pulmonary disease, unspecified: Secondary | ICD-10-CM | POA: Diagnosis not present

## 2020-05-14 DIAGNOSIS — I739 Peripheral vascular disease, unspecified: Secondary | ICD-10-CM

## 2020-05-14 NOTE — Progress Notes (Signed)
[facility] [address] [place], [place] [address]  Internal MEDICINE  Telephone Visit  Patient Name: Erik [name]  [id number]  [id number]  Date of Service: 05/18/2020  I connected with the patient at 1430 by telephone and verified the patients identity using two identifiers.   I discussed the limitations, risks, security and privacy concerns of performing an evaluation and management service by telephone and the availability of in person appointments. I also discussed with the patient that there may be a patient responsible charge related to the service.  The patient expressed understanding and agrees to proceed.    Chief Complaint  Patient presents with  . Follow-up    ultrasound results  . controlled sunstance policy    reviewed with pt  . Telephone Assessment    phone visit, [phone]  . Telephone Screen    HPI Patient is here for routine follow-up We discussed his [place] results--significant vascular disease in bilateral lower extremities Has an appointment to see vascular surgery on October 25th He does have wounds that will not heal on his legs, also lots of swelling Wife says he has multiple holes in the back of his legs that have been present for many months and will not heal  Followed by cardiology CAD and HFpEF as well as chronic A. Fib.-has been non-adherent with cardiology follow-ups, was seen 01/21 for complaints of increased shortness of breath--has since been treated with many rounds of antibiotics and steroids for possible COPD exacerbations Non-adherent with supplemental oxygen as well as inhaler therapy   Current Medication: Outpatient Encounter Medications as of 05/14/2020  Medication Sig  . albuterol (VENTOLIN HFA) 108 (90 Base) MCG/ACT inhaler Inhale into the lungs every 6 (six) hours as needed for wheezing or shortness of breath.  [name] aspirin 325 MG tablet Take 325 mg by mouth daily.  . budesonide-formoterol (SYMBICORT) 160-4.5 MCG/ACT  inhaler Inhale 2 puffs into the lungs 2 (two) times daily.  [name] diltiazem (CARDIZEM CD) 180 MG 24 hr capsule Take 1 capsule (180 mg total) by mouth daily.  . furosemide (LASIX) 20 MG tablet Take 20 mg by mouth daily.  [name] ipratropium-albuterol (DUONEB) 0.5-2.5 (3) MG/3ML SOLN USE 1 VIAL VIA NEBULIZER EVERY 6 HOURS  . loratadine (CLARITIN) 10 MG tablet Take 10 mg by mouth daily.  . OXYGEN Inhale into the lungs.  . TRELEGY ELLIPTA 100-62.5-25 MCG/INH AEPB Inhale 1 puff into the lungs daily.  . meloxicam (MOBIC) 7.5 MG tablet Take 1 tablet (7.5 mg total) by mouth daily.   No facility-administered encounter medications on file as of 05/14/2020.    Surgical History: Past Surgical History:  Procedure Laterality Date  . ANGIOPLASTY  2009  . CARDIAC CATHETERIZATION N/A 03/18/2015   Procedure: Left Heart Cath with Coronary Angiography;  Surgeon: Lamar Blinks, MD;  Location: [facility] INVASIVE CV LAB;  Service: Cardiovascular;  Laterality: N/A;  . CHOLECYSTECTOMY  2005  . CORONARY ANGIOGRAM  2009    Medical History: Past Medical History:  Diagnosis Date  . Arthritis   . Asthma   . Atrial fibrillation (HCC)   . CHF (congestive heart failure) (HCC)   . COPD (chronic obstructive pulmonary disease) (HCC)   . Coronary artery disease   . GERD (gastroesophageal reflux disease)   . Hypertension   . Myocardial infarction (HCC)   . Shortness of breath dyspnea     Family History: Family History  Problem Relation Age of Onset  . Coronary artery disease Father   . Diabetes Father   .  Hyperlipidemia Father   . Hypertension Father     Social History   Socioeconomic History  . Marital status: Married    Spouse name: Not on file  . Number of children: Not on file  . Years of education: Not on file  . Highest education level: Not on file  Occupational History  . Not on file  Tobacco Use  . Smoking status: Former Smoker    Years: 25.00    Quit date: 03/20/1997    Years since quitting: 23.1  .  Smokeless tobacco: Never Used  Vaping Use  . Vaping Use: Never used  Substance and Sexual Activity  . Alcohol use: No  . Drug use: No  . Sexual activity: Not on file  Other Topics Concern  . Not on file  Social History Narrative  . Not on file   Social Determinants of Health   Financial Resource Strain:   . Difficulty of Paying Living Expenses: Not on file  Food Insecurity:   . Worried About Programme researcher, broadcasting/film/video in the Last Year: Not on file  . Ran Out of Food in the Last Year: Not on file  Transportation Needs:   . Lack of Transportation (Medical): Not on file  . Lack of Transportation (Non-Medical): Not on file  Physical Activity:   . Days of Exercise per Week: Not on file  . Minutes of Exercise per Session: Not on file  Stress:   . Feeling of Stress : Not on file  Social Connections:   . Frequency of Communication with Friends and Family: Not on file  . Frequency of Social Gatherings with Friends and Family: Not on file  . Attends Religious Services: Not on file  . Active Member of Clubs or Organizations: Not on file  . Attends Banker Meetings: Not on file  . Marital Status: Not on file  Intimate Partner Violence:   . Fear of Current or Ex-Partner: Not on file  . Emotionally Abused: Not on file  . Physically Abused: Not on file  . Sexually Abused: Not on file   Review of Systems  Constitutional: Negative for chills, fatigue and unexpected weight change.  HENT: Negative for congestion, postnasal drip, rhinorrhea, sneezing and sore throat.   Eyes: Negative for photophobia, redness and visual disturbance.  Respiratory: Positive for cough, shortness of breath and wheezing. Negative for chest tightness.   Cardiovascular: Positive for palpitations and leg swelling. Negative for chest pain.  Gastrointestinal: Negative for abdominal pain, constipation, diarrhea, nausea and vomiting.  Genitourinary: Negative for dysuria and frequency.  Musculoskeletal: Negative  for arthralgias, back pain, joint swelling and neck pain.  Skin: Negative for rash.       Multiple non-healing wounds and blisters on bilateral lower extemities  Neurological: Negative for dizziness, tremors, light-headedness, numbness and headaches.  Hematological: Negative for adenopathy. Does not bruise/bleed easily.  Psychiatric/Behavioral: Negative for behavioral problems (Depression), sleep disturbance and suicidal ideas. The patient is not nervous/anxious.     Vital Signs: Resp 16   Ht 5\' 10"  (1.778 m)   Wt 237 lb (107.5 kg)   BMI 34.01 kg/m    Observation/Objective: No acute distress noted  Assessment/Plan: 1. Peripheral vascular disease of lower extremity (HCC) Discussed importance of following through with scheduled appointment with vascular surgery later this month Discussed the risk of wounds becoming infected and causing further complications Will follow-up again after consultation with vascular surgery  2. COPD with hypoxia [facility]) Discussed importance of adherence to inhalers as  well as supplemental oxygen therapy Closely followed by pulmonology  General Counseling: Aayden verbalizes understanding of the findings of today's phone visit and agrees with plan of treatment. I have discussed any further diagnostic evaluation that may be needed or ordered today. We also reviewed his medications today. he has been encouraged to call the office with any questions or concerns that should arise related to todays visit.   Time spent: 30 Minutes Time spent includes review of chart, medications, test results and follow-up plan with the patient.  Lubertha Basque Kimberla Driskill AGNP-C Internal medicine

## 2020-05-18 ENCOUNTER — Encounter: Payer: Self-pay | Admitting: Hospice and Palliative Medicine

## 2020-05-18 DIAGNOSIS — J449 Chronic obstructive pulmonary disease, unspecified: Secondary | ICD-10-CM | POA: Diagnosis not present

## 2020-05-18 DIAGNOSIS — J439 Emphysema, unspecified: Secondary | ICD-10-CM | POA: Diagnosis not present

## 2020-06-01 ENCOUNTER — Other Ambulatory Visit: Payer: Self-pay

## 2020-06-01 ENCOUNTER — Ambulatory Visit (INDEPENDENT_AMBULATORY_CARE_PROVIDER_SITE_OTHER): Payer: Medicare Other | Admitting: Vascular Surgery

## 2020-06-01 ENCOUNTER — Encounter (INDEPENDENT_AMBULATORY_CARE_PROVIDER_SITE_OTHER): Payer: Self-pay | Admitting: Vascular Surgery

## 2020-06-01 VITALS — BP 152/107 | HR 134 | Resp 20 | Ht 70.0 in

## 2020-06-01 DIAGNOSIS — I70213 Atherosclerosis of native arteries of extremities with intermittent claudication, bilateral legs: Secondary | ICD-10-CM

## 2020-06-01 DIAGNOSIS — I2510 Atherosclerotic heart disease of native coronary artery without angina pectoris: Secondary | ICD-10-CM

## 2020-06-01 DIAGNOSIS — I6523 Occlusion and stenosis of bilateral carotid arteries: Secondary | ICD-10-CM

## 2020-06-01 DIAGNOSIS — I482 Chronic atrial fibrillation, unspecified: Secondary | ICD-10-CM

## 2020-06-01 DIAGNOSIS — I872 Venous insufficiency (chronic) (peripheral): Secondary | ICD-10-CM | POA: Diagnosis not present

## 2020-06-01 DIAGNOSIS — I10 Essential (primary) hypertension: Secondary | ICD-10-CM | POA: Diagnosis not present

## 2020-06-01 DIAGNOSIS — I70219 Atherosclerosis of native arteries of extremities with intermittent claudication, unspecified extremity: Secondary | ICD-10-CM | POA: Insufficient documentation

## 2020-06-01 DIAGNOSIS — I2583 Coronary atherosclerosis due to lipid rich plaque: Secondary | ICD-10-CM

## 2020-06-01 NOTE — Progress Notes (Signed)
MRN : [id number]  Erik [name] is a 64 y.o. (1955-12-16) male who presents with chief complaint of leg pain.  History of Present Illness:    The patient is seen for evaluation of painful lower extremities and diminished pulses. Patient notes the pain is always associated with activity and is very consistent day today. Typically, the pain occurs at less than one block, progress is as activity continues to the point that the patient must stop walking. Resting including standing still for several minutes allowed resumption of the activity and the ability to walk a similar distance before stopping again. Uneven terrain and inclined shorten the distance. The pain has been progressive over the past several years. The patient states the inability to walk is now having a profound negative impact on quality of life and daily activities.  The patient denies rest pain or dangling of an extremity off the side of the bed during the night for relief. No open wounds or sores at this time. No prior interventions or surgeries.  No history of back problems or DJD of the lumbar sacral spine.   The patient denies changes in claudication symptoms or new rest pain symptoms.  No new ulcers or wounds of the foot.  The patient's blood pressure has been stable and relatively well controlled. The patient denies amaurosis fugax or recent TIA symptoms. There are no recent neurological changes noted. The patient denies history of DVT, PE or superficial thrombophlebitis. The patient denies recent episodes of angina or shortness of breath.   No outpatient medications have been marked as taking for the 06/01/20 encounter (Appointment) with Gilda Crease, Latina Craver, MD.    Past Medical History:  Diagnosis Date  . Arthritis   . Asthma   . Atrial fibrillation (HCC)   . CHF (congestive heart failure) (HCC)   . COPD (chronic obstructive pulmonary disease) (HCC)   . Coronary artery disease   . GERD (gastroesophageal  reflux disease)   . Hypertension   . Myocardial infarction (HCC)   . Shortness of breath dyspnea     Past Surgical History:  Procedure Laterality Date  . ANGIOPLASTY  2009  . CARDIAC CATHETERIZATION N/A 03/18/2015   Procedure: Left Heart Cath with Coronary Angiography;  Surgeon: Lamar Blinks, MD;  Location: [facility] INVASIVE CV LAB;  Service: Cardiovascular;  Laterality: N/A;  . CHOLECYSTECTOMY  2005  . CORONARY ANGIOGRAM  2009    Social History Social History   Tobacco Use  . Smoking status: Former Smoker    Years: 25.00    Quit date: 03/20/1997    Years since quitting: 23.2  . Smokeless tobacco: Never Used  Vaping Use  . Vaping Use: Never used  Substance Use Topics  . Alcohol use: No  . Drug use: No    Family History Family History  Problem Relation Age of Onset  . Coronary artery disease Father   . Diabetes Father   . Hyperlipidemia Father   . Hypertension Father   No family history of bleeding/clotting disorders, porphyria or autoimmune disease   Allergies  Allergen Reactions  . Benadryl [Diphenhydramine Hcl] Shortness Of Breath  . Morphine And Related      REVIEW OF SYSTEMS (Negative unless checked)  Constitutional: [] Weight loss  [] Fever  [] Chills Cardiac: [] Chest pain   [] Chest pressure   [] Palpitations   [] Shortness of breath when laying flat   [] Shortness of breath with exertion. Vascular:  [x] Pain in legs with walking   [x] Pain in legs at rest  []   History of DVT   [] Phlebitis   [x] Swelling in legs   [] Varicose veins   [] Non-healing ulcers Pulmonary:   [] Uses home oxygen   [] Productive cough   [] Hemoptysis   [] Wheeze  [] COPD   [] Asthma Neurologic:  [] Dizziness   [] Seizures   [] History of stroke   [] History of TIA  [] Aphasia   [] Vissual changes   [] Weakness or numbness in arm   [] Weakness or numbness in leg Musculoskeletal:   [] Joint swelling   [x] Joint pain   [] Low back pain Hematologic:  [] Easy bruising  [] Easy bleeding   [] Hypercoagulable state    [] Anemic Gastrointestinal:  [] Diarrhea   [] Vomiting  [] Gastroesophageal reflux/heartburn   [] Difficulty swallowing. Genitourinary:  [] Chronic kidney disease   [] Difficult urination  [] Frequent urination   [] Blood in urine Skin:  [] Rashes   [] Ulcers  Psychological:  [] History of anxiety   []  History of major depression.  Physical Examination  There were no vitals filed for this visit. There is no height or weight on file to calculate BMI. Gen: WD/WN, NAD Head: [place]/AT, No temporalis wasting.  Ear/Nose/Throat: Hearing grossly intact, nares w/o erythema or drainage, poor dentition Eyes: PER, EOMI, sclera nonicteric.  Neck: Supple, no masses.  No bruit or JVD.  Pulmonary:  Good air movement, clear to auscultation bilaterally, no use of accessory muscles.  Cardiac: RRR, normal S1, S2, no Murmurs. Vascular:  bilateral carotid bruit; scattered varicosities present bilaterally.  Severe venous stasis changes to the legs bilaterally.  4+ hard non-pitting edema with extensive scaring Vessel Right Left  Radial Palpable Palpable  PT Not Palpable Not Palpable  DP Not Palpable Not Palpable  Gastrointestinal: soft, non-distended. No guarding/no peritoneal signs.  Musculoskeletal: M/S 5/5 throughout.  No deformity or atrophy.  Neurologic: CN 2-12 intact. Pain and light touch intact in extremities.  Symmetrical.  Speech is fluent. Motor exam as listed above. Psychiatric: Judgment intact, Mood & affect appropriate for pt's clinical situation. Dermatologic: Severe venous rashes no ulcers noted.  No changes consistent with cellulitis. Lymph : Extensive lichenification and skin changes of chronic lymphedema.  CBC Lab Results  Component Value Date   WBC 8.5 02/19/2020   HGB 15.1 02/19/2020   HCT 45.6 02/19/2020   MCV 90 02/19/2020   PLT 345 02/19/2020    BMET    Component Value Date/Time   NA 138 02/19/2020 0000   NA 135 (L) 07/19/2013 0417   K 4.0 02/19/2020 0000   K 3.9 07/19/2013 0417   CL  100 02/19/2020 0000   CL 101 07/19/2013 0417   CO2 24 02/19/2020 0000   CO2 29 07/19/2013 0417   GLUCOSE 106 (H) 02/19/2020 0000   GLUCOSE 170 (H) 08/14/2017 0527   GLUCOSE 86 07/19/2013 0417   BUN 14 02/19/2020 0000   BUN 10 07/19/2013 0417   CREATININE 0.87 02/19/2020 0000   CREATININE 0.80 07/19/2013 0417   CALCIUM 9.1 02/19/2020 0000   CALCIUM 8.4 (L) 07/19/2013 0417   GFRNONAA 91 02/19/2020 0000   GFRNONAA >60 07/19/2013 0417   GFRAA 105 02/19/2020 0000   GFRAA >60 07/19/2013 0417   CrCl cannot be calculated (Patient's most recent lab result is older than the maximum 21 days allowed.).  COAG No results found for: INR, PROTIME  Radiology No results found.   Assessment/Plan 1. Atherosclerosis of native artery of both lower extremities with intermittent claudication (HCC) Recommend:  Patient should undergo arterial duplex of the lower extremity ASAP because there has been a significant deterioration in the patient's lower  extremity symptoms.  The patient states they are having increased pain and a marked decrease in the distance that they can walk.  The risks and benefits as well as the alternatives were discussed in detail with the patient.  All questions were answered.  Patient agrees to proceed and understands this could be a prelude to angiography and intervention.  The patient will follow up with me in the office to review the studies.   - VAS [place] ABI WITH/WO TBI; Future  2. Chronic venous insufficiency No surgery or intervention at this point in time.    I have had a long discussion with the patient regarding venous insufficiency and why it  causes symptoms. I have discussed with the patient the chronic skin changes that accompany venous insufficiency and the long term sequela such as infection and ulceration.  Patient will begin wearing graduated compression stockings class 1 (20-30 mmHg) or compression wraps on a daily basis a prescription was given. The patient  will put the stockings on first thing in the morning and removing them in the evening. The patient is instructed specifically not to sleep in the stockings.    In addition, behavioral modification including several periods of elevation of the lower extremities during the day will be continued. I have demonstrated that proper elevation is a position with the ankles at heart level.  The patient is instructed to begin routine exercise, especially walking on a daily basis  Patient should undergo duplex ultrasound of the venous system to ensure that DVT or reflux is not present.  Following the review of the ultrasound the patient will follow up in 2-3 months to reassess the degree of swelling and the control that graduated compression stockings or compression wraps  is offering.   The patient can be assessed for a Lymph Pump at that time - VAS [place] LOWER EXTREMITY VENOUS REFLUX; Future  3. Occlusion and stenosis of bilateral carotid arteries Recommend:  Given the patient's asymptomatic subcritical stenosis no further invasive testing or surgery at this time.  Duplex ultrasound is overdue and will be ordered  Continue antiplatelet therapy as prescribed Continue management of CAD, HTN and Hyperlipidemia Healthy heart diet,  encouraged exercise at least 4 times per week Follow up in 1 month with duplex ultrasound and physical exam   - VAS US CAROTID; Future  4. Essential hypertension with goal blood pressure less than 130/80 Continue antihypertensive medications as already ordered, these medications have been reviewed and there are no changes at this time.   5. Chronic a-fib (HCC) Continue antiarrhythmia medications as already ordered, these medications have been reviewed and there are no changes at this time.  Continue anticoagulation as ordered by Cardiology Service   6. Coronary artery disease due to lipid rich plaque Continue cardiac and antihypertensive medications as already ordered  and reviewed, no changes at this time.  Continue statin as ordered and reviewed, no changes at this time  Nitrates PRN for chest pain    Levora Dredge, MD  06/01/2020 12:25 PM

## 2020-06-17 ENCOUNTER — Ambulatory Visit (INDEPENDENT_AMBULATORY_CARE_PROVIDER_SITE_OTHER): Payer: Medicare Other | Admitting: Nurse Practitioner

## 2020-06-17 ENCOUNTER — Ambulatory Visit (INDEPENDENT_AMBULATORY_CARE_PROVIDER_SITE_OTHER): Payer: Medicare Other

## 2020-06-17 ENCOUNTER — Other Ambulatory Visit: Payer: Self-pay

## 2020-06-17 ENCOUNTER — Encounter (INDEPENDENT_AMBULATORY_CARE_PROVIDER_SITE_OTHER): Payer: Self-pay | Admitting: Nurse Practitioner

## 2020-06-17 VITALS — BP 177/97 | HR 81 | Resp 16

## 2020-06-17 DIAGNOSIS — I70213 Atherosclerosis of native arteries of extremities with intermittent claudication, bilateral legs: Secondary | ICD-10-CM

## 2020-06-17 DIAGNOSIS — I10 Essential (primary) hypertension: Secondary | ICD-10-CM | POA: Diagnosis not present

## 2020-06-17 DIAGNOSIS — I872 Venous insufficiency (chronic) (peripheral): Secondary | ICD-10-CM | POA: Diagnosis not present

## 2020-06-17 DIAGNOSIS — I6523 Occlusion and stenosis of bilateral carotid arteries: Secondary | ICD-10-CM

## 2020-06-18 DIAGNOSIS — J449 Chronic obstructive pulmonary disease, unspecified: Secondary | ICD-10-CM | POA: Diagnosis not present

## 2020-06-18 DIAGNOSIS — J439 Emphysema, unspecified: Secondary | ICD-10-CM | POA: Diagnosis not present

## 2020-06-23 NOTE — Progress Notes (Signed)
Subjective:    Patient ID: Erik [name], male    DOB: [birth year], 64 y.o.   MRN: [id number] Chief Complaint  Patient presents with  . Follow-up    ultrasound follow up    The patient follows up today for multiple vascular studies.  The patient has a history of carotid artery stenosis as well as peripheral arterial disease.  The patient also has had several wounds and ulcerations on his bilateral lower extremities for several months.  The patient also notes that he has had extensive swelling as well.  The patient notes that these wounds and blisters began to appearance legs they began to leak and they seem to get better for time and then repeat.  This has been ongoing for several months.  He denies any fever, chills, nausea, vomiting or diarrhea.  The patient also notes that there have been episodes of cellulitis.  The patient currently has not been wearing medical grade 1 compression stockings daily difficulty with donning them.  Today noninvasive studies show 1 to 39% stenosis in the right ICA with 40 to 59% stenosis of the left ICA.  The bilateral vertebral arteries have antegrade flow with normal flow hemodynamics seen within the bilateral subclavian arteries.  The patient has an ABI 0.96 on the right and 1.19 on the left.  The patient has biphasic tibial artery waveforms with slightly dampened toe waveforms.  The patient has no evidence of DVT or superficial venous thrombosis seen bilaterally.  The patient does have evidence of deep venous insufficiency seen in the left lower extremity.  The patient also has reflux of the great saphenous vein at the saphenofemoral junction seen bilaterally.   Review of Systems  Cardiovascular: Positive for leg swelling.  Skin: Positive for wound.  All other systems reviewed and are negative.      Objective:   Physical Exam Vitals reviewed.  HENT:     Head: Normocephalic.  Cardiovascular:     Rate and Rhythm: Normal rate.     Pulses: Decreased  pulses.  Pulmonary:     Effort: Pulmonary effort is normal.  Musculoskeletal:     Right lower leg: 2+ Pitting Edema present.     Left lower leg: 2+ Pitting Edema present.  Skin:    Comments: Weeping wound  Neurological:     Mental Status: He is alert and oriented to person, place, and time.  Psychiatric:        Mood and Affect: Mood normal.        Behavior: Behavior normal.        Thought Content: Thought content normal.        Judgment: Judgment normal.     BP (!) 177/97 (BP Location: Left Arm)   Pulse 81   Resp 16   Past Medical History:  Diagnosis Date  . Arthritis   . Asthma   . Atrial fibrillation (HCC)   . CHF (congestive heart failure) (HCC)   . COPD (chronic obstructive pulmonary disease) (HCC)   . Coronary artery disease   . GERD (gastroesophageal reflux disease)   . Hypertension   . Myocardial infarction (HCC)   . Shortness of breath dyspnea     Social History   Socioeconomic History  . Marital status: Married    Spouse name: Not on file  . Number of children: Not on file  . Years of education: Not on file  . Highest education level: Not on file  Occupational History  . Not on file  Tobacco Use  . Smoking status: Former Smoker    Years: 25.00    Quit date: 03/20/1997    Years since quitting: 23.2  . Smokeless tobacco: Never Used  Vaping Use  . Vaping Use: Never used  Substance and Sexual Activity  . Alcohol use: No  . Drug use: No  . Sexual activity: Not on file  Other Topics Concern  . Not on file  Social History Narrative  . Not on file   Social Determinants of Health   Financial Resource Strain:   . Difficulty of Paying Living Expenses: Not on file  Food Insecurity:   . Worried About Programme researcher, broadcasting/film/video in the Last Year: Not on file  . Ran Out of Food in the Last Year: Not on file  Transportation Needs:   . Lack of Transportation (Medical): Not on file  . Lack of Transportation (Non-Medical): Not on file  Physical Activity:   .  Days of Exercise per Week: Not on file  . Minutes of Exercise per Session: Not on file  Stress:   . Feeling of Stress : Not on file  Social Connections:   . Frequency of Communication with [name] and Family: Not on file  . Frequency of Social Gatherings with [name] and Family: Not on file  . Attends Religious Services: Not on file  . Active Member of Clubs or Organizations: Not on file  . Attends Banker Meetings: Not on file  . Marital Status: Not on file  Intimate Partner Violence:   . Fear of Current or Ex-Partner: Not on file  . Emotionally Abused: Not on file  . Physically Abused: Not on file  . Sexually Abused: Not on file    Past Surgical History:  Procedure Laterality Date  . ANGIOPLASTY  2009  . CARDIAC CATHETERIZATION N/A 03/18/2015   Procedure: Left Heart Cath with Coronary Angiography;  Surgeon: Lamar Blinks, MD;  Location: [facility] INVASIVE CV LAB;  Service: Cardiovascular;  Laterality: N/A;  . CHOLECYSTECTOMY  2005  . CORONARY ANGIOGRAM  2009    Family History  Problem Relation Age of Onset  . Coronary artery disease Father   . Diabetes Father   . Hyperlipidemia Father   . Hypertension Father     Allergies  Allergen Reactions  . Benadryl [Diphenhydramine Hcl] Shortness Of Breath  . Morphine And Related     CBC Latest Ref Rng & Units 02/19/2020 08/14/2017 08/13/2017  WBC 3.4 - 10.8 x10E3/uL 8.5 8.3 6.9  Hemoglobin 13.0 - 17.7 g/dL 48.2 50.0 37.0  Hematocrit 37.5 - 51.0 % 45.6 50.3 40.5  Platelets 150 - 450 x10E3/uL 345 329 220      CMP     Component Value Date/Time   NA 138 02/19/2020 0000   NA 135 (L) 07/19/2013 0417   K 4.0 02/19/2020 0000   K 3.9 07/19/2013 0417   CL 100 02/19/2020 0000   CL 101 07/19/2013 0417   CO2 24 02/19/2020 0000   CO2 29 07/19/2013 0417   GLUCOSE 106 (H) 02/19/2020 0000   GLUCOSE 170 (H) 08/14/2017 0527   GLUCOSE 86 07/19/2013 0417   BUN 14 02/19/2020 0000   BUN 10 07/19/2013 0417   CREATININE 0.87  02/19/2020 0000   CREATININE 0.80 07/19/2013 0417   CALCIUM 9.1 02/19/2020 0000   CALCIUM 8.4 (L) 07/19/2013 0417   PROT 6.3 02/19/2020 0000   PROT 6.6 07/19/2013 0417   ALBUMIN 4.2 02/19/2020 0000   ALBUMIN 2.8 (L) 07/19/2013  0417   AST 14 02/19/2020 0000   AST 33 07/19/2013 0417   ALT 12 02/19/2020 0000   ALT 32 07/19/2013 0417   ALKPHOS 81 02/19/2020 0000   ALKPHOS 85 07/19/2013 0417   BILITOT 0.4 02/19/2020 0000   BILITOT 1.6 (H) 07/19/2013 0417   GFRNONAA 91 02/19/2020 0000   GFRNONAA >60 07/19/2013 0417   GFRAA 105 02/19/2020 0000   GFRAA >60 07/19/2013 0417     VAS [place] ABI WITH/WO TBI  Result Date: 06/18/2020 LOWER EXTREMITY DOPPLER STUDY Indications: Rest pain, and ulceration.  Performing Technologist: Reece Agar RT (R)(VS)  Examination Guidelines: A complete evaluation includes at minimum, Doppler waveform signals and systolic blood pressure reading at the level of bilateral brachial, anterior tibial, and posterior tibial arteries, when vessel segments are accessible. Bilateral testing is considered an integral part of a complete examination. Photoelectric Plethysmograph (PPG) waveforms and toe systolic pressure readings are included as required and additional duplex testing as needed. Limited examinations for reoccurring indications may be performed as noted.  ABI Findings: +---------+------------------+-----+--------+--------+ Right    Rt Pressure (mmHg)IndexWaveformComment  +---------+------------------+-----+--------+--------+ Brachial 157                                     +---------+------------------+-----+--------+--------+ ATA      154               0.96 biphasic         +---------+------------------+-----+--------+--------+ PTA      153               0.96 biphasic         +---------+------------------+-----+--------+--------+ Great Toe162               1.01 Dampened         +---------+------------------+-----+--------+--------+  +---------+------------------+-----+--------+-------+ Left     Lt Pressure (mmHg)IndexWaveformComment +---------+------------------+-----+--------+-------+ Brachial 160                                    +---------+------------------+-----+--------+-------+ ATA      190               1.19 biphasic        +---------+------------------+-----+--------+-------+ PTA      185               1.16 biphasic        +---------+------------------+-----+--------+-------+ Great Toe144               0.90 Abnormal        +---------+------------------+-----+--------+-------+ Summary: Right: Resting right ankle-brachial index is within normal range. No evidence of significant right lower extremity arterial disease. The right toe-brachial index is normal. Left: Resting left ankle-brachial index is within normal range. No evidence of significant left lower extremity arterial disease. The left toe-brachial index is normal.  *See table(s) above for measurements and observations.  Electronically signed by Levora Dredge MD on 06/18/2020 at 3:42:45 PM.   Final        Assessment & Plan:   1. Atherosclerosis of native artery of both lower extremities with intermittent claudication (HCC)  Recommend:  The patient has evidence of atherosclerosis of the lower extremities with claudication.  The patient does not voice lifestyle limiting changes at this point in time.  Noninvasive studies do not suggest clinically significant change.  No invasive studies, angiography or surgery at  this time The patient should continue walking and begin a more formal exercise program.  The patient should continue antiplatelet therapy and aggressive treatment of the lipid abnormalities  No changes in the patient's medications at this time  The patient should continue wearing graduated compression socks 10-15 mmHg strength to control the mild edema.    2. Occlusion and stenosis of bilateral carotid  arteries Recommend:  Given the patient's asymptomatic subcritical stenosis no further invasive testing or surgery at this time.  Duplex ultrasound shows <50% stenosis bilaterally.  Continue antiplatelet therapy as prescribed Continue management of CAD, HTN and Hyperlipidemia Healthy heart diet,  encouraged exercise at least 4 times per week Follow up in 12 months with duplex ultrasound and physical exam   3. Essential hypertension with goal blood pressure less than 130/80 Continue antihypertensive medications as already ordered, these medications have been reviewed and there are no changes at this time.   4. Chronic venous insufficiency No surgery or intervention at this point in time.    I have had a long discussion with the patient regarding venous insufficiency and why it  causes symptoms, specifically venous ulceration . I have discussed with the patient the chronic skin changes that accompany venous insufficiency and the long term sequela such as infection and recurring  ulceration.  Patient will be placed in [employer] Boot[employer] which will be changed weekly drainage permitting.  In addition, behavioral modification including several periods of elevation of the lower extremities during the day will be continued. Achieving a position with the ankles at heart level was stressed to the patient  The patient is instructed to begin routine exercise, especially walking on a daily basis  Patient should undergo duplex ultrasound of the venous system to ensure that DVT or reflux is not present.  Following the review of the ultrasound the patient will follow up in 4 weeks to reassess the degree of swelling and the control that Unna therapy is offering.   The patient can be assessed for graduated compression stockings or wraps as well as a Lymph Pump once the ulcers are healed.    Current Outpatient Medications on File Prior to Visit  Medication Sig Dispense Refill  . albuterol (VENTOLIN HFA) 108 (90  Base) MCG/ACT inhaler Inhale into the lungs every 6 (six) hours as needed for wheezing or shortness of breath.    [name]. aspirin 325 MG tablet Take 325 mg by mouth daily.    . budesonide-formoterol (SYMBICORT) 160-4.5 MCG/ACT inhaler Inhale 2 puffs into the lungs 2 (two) times daily.    [name]. diltiazem (CARDIZEM CD) 180 MG 24 hr capsule Take 1 capsule (180 mg total) by mouth daily. 30 capsule 0  . furosemide (LASIX) 20 MG tablet Take 20 mg by mouth daily.    [name]. ipratropium-albuterol (DUONEB) 0.5-2.5 (3) MG/3ML SOLN USE 1 VIAL VIA NEBULIZER EVERY 6 HOURS 360 mL 6  . loratadine (CLARITIN) 10 MG tablet Take 10 mg by mouth daily.    . meloxicam (MOBIC) 7.5 MG tablet Take 1 tablet (7.5 mg total) by mouth daily. 30 tablet 0  . OXYGEN Inhale into the lungs.    . TRELEGY ELLIPTA 100-62.5-25 MCG/INH AEPB Inhale 1 puff into the lungs daily.     No current facility-administered medications on file prior to visit.    There are no Patient Instructions on file for this visit. No follow-ups on file.   Georgiana SpinnerFallon E Nakiya Rallis, NP

## 2020-06-24 ENCOUNTER — Encounter (INDEPENDENT_AMBULATORY_CARE_PROVIDER_SITE_OTHER): Payer: Self-pay | Admitting: Nurse Practitioner

## 2020-06-24 ENCOUNTER — Ambulatory Visit (INDEPENDENT_AMBULATORY_CARE_PROVIDER_SITE_OTHER): Payer: Medicare Other | Admitting: Nurse Practitioner

## 2020-06-24 ENCOUNTER — Other Ambulatory Visit: Payer: Self-pay

## 2020-06-24 DIAGNOSIS — I872 Venous insufficiency (chronic) (peripheral): Secondary | ICD-10-CM

## 2020-06-24 NOTE — Progress Notes (Signed)
History of Present Illness  There is no documented history at this time  Assessments & Plan   There are no diagnoses linked to this encounter.    Additional instructions  Subjective:  Patient presents with venous ulcer of the Bilateral lower extremity.    Procedure:  3 layer unna wrap was placed Bilateral lower extremity.   Plan:   Follow up in one week.  

## 2020-06-28 ENCOUNTER — Encounter (INDEPENDENT_AMBULATORY_CARE_PROVIDER_SITE_OTHER): Payer: Self-pay | Admitting: Nurse Practitioner

## 2020-07-01 ENCOUNTER — Ambulatory Visit (INDEPENDENT_AMBULATORY_CARE_PROVIDER_SITE_OTHER): Payer: Medicare Other | Admitting: Nurse Practitioner

## 2020-07-01 ENCOUNTER — Other Ambulatory Visit: Payer: Self-pay

## 2020-07-01 VITALS — BP 138/89 | HR 77 | Ht 70.0 in | Wt 240.0 lb

## 2020-07-01 DIAGNOSIS — I872 Venous insufficiency (chronic) (peripheral): Secondary | ICD-10-CM

## 2020-07-01 NOTE — Progress Notes (Signed)
History of Present Illness  There is no documented history at this time  Assessments & Plan   There are no diagnoses linked to this encounter.    Additional instructions  Subjective:  Patient presents with venous ulcer of the Bilateral lower extremity.    Procedure:  3 layer unna wrap was placed Bilateral lower extremity.   Plan:   Follow up in one week.  

## 2020-07-06 ENCOUNTER — Encounter (INDEPENDENT_AMBULATORY_CARE_PROVIDER_SITE_OTHER): Payer: Self-pay | Admitting: Nurse Practitioner

## 2020-07-08 ENCOUNTER — Ambulatory Visit (INDEPENDENT_AMBULATORY_CARE_PROVIDER_SITE_OTHER): Payer: Medicare Other | Admitting: Nurse Practitioner

## 2020-07-08 ENCOUNTER — Other Ambulatory Visit: Payer: Self-pay

## 2020-07-08 ENCOUNTER — Encounter (INDEPENDENT_AMBULATORY_CARE_PROVIDER_SITE_OTHER): Payer: Self-pay | Admitting: Nurse Practitioner

## 2020-07-08 VITALS — BP 150/76 | HR 65 | Resp 16 | Wt 239.0 lb

## 2020-07-08 DIAGNOSIS — L97909 Non-pressure chronic ulcer of unspecified part of unspecified lower leg with unspecified severity: Secondary | ICD-10-CM

## 2020-07-08 DIAGNOSIS — I83009 Varicose veins of unspecified lower extremity with ulcer of unspecified site: Secondary | ICD-10-CM | POA: Diagnosis not present

## 2020-07-08 DIAGNOSIS — J449 Chronic obstructive pulmonary disease, unspecified: Secondary | ICD-10-CM | POA: Diagnosis not present

## 2020-07-08 DIAGNOSIS — I10 Essential (primary) hypertension: Secondary | ICD-10-CM | POA: Diagnosis not present

## 2020-07-13 ENCOUNTER — Encounter (INDEPENDENT_AMBULATORY_CARE_PROVIDER_SITE_OTHER): Payer: Self-pay | Admitting: Nurse Practitioner

## 2020-07-13 ENCOUNTER — Telehealth (INDEPENDENT_AMBULATORY_CARE_PROVIDER_SITE_OTHER): Payer: Self-pay

## 2020-07-13 NOTE — Progress Notes (Signed)
Subjective:    Patient ID: Erik [name], male    DOB: 10-Feb-1956, 64 y.o.   MRN: [id number] Chief Complaint  Patient presents with  . Follow-up    4wk bil unna boot check    Patient is seen for follow up evaluation of leg pain and swelling associated with venous ulceration. The patient was recently seen here and started on Unna boot therapy.  The swelling abruptly became much worse bilaterally and is associated with pain and discoloration. The pain and swelling worsens with prolonged dependency and improves with elevation.  The patient notes that in the morning the legs are better but the leg symptoms worsened throughout the course of the day. The patient has also noted a progressive worsening of the discoloration in the ankle and shin area.   The patient notes that an ulcer has developed acutely without specific trauma and since it occurred it has been very slow to heal.  There is a moderate amount of drainage associated with the open area.  The wound is also very painful.   The patient states that they have been elevating as much as possible. The patient denies any recent changes in medications.  The patient denies a history of DVT or PE. There is no prior history of phlebitis. There is no history of primary lymphedema.  No SOB or increased cough.  No sputum production.  No recent episodes of CHF exacerbation.   Review of Systems  Respiratory: Positive for shortness of breath (with exertion).   Cardiovascular: Positive for leg swelling.  Skin: Positive for wound.  All other systems reviewed and are negative.      Objective:   Physical Exam Vitals reviewed.  HENT:     Head: Normocephalic.  Cardiovascular:     Rate and Rhythm: Normal rate.     Pulses: Normal pulses.  Skin:    General: Skin is warm and dry.  Neurological:     Mental Status: He is alert and oriented to person, place, and time.     Motor: Weakness present.     Gait: Gait abnormal.  Psychiatric:         Mood and Affect: Mood normal.        Behavior: Behavior normal.        Thought Content: Thought content normal.        Judgment: Judgment normal.     BP (!) 150/76 (BP Location: Left Arm)   Pulse 65   Resp 16   Wt 239 lb (108.4 kg)   BMI 34.29 kg/m   Past Medical History:  Diagnosis Date  . Arthritis   . Asthma   . Atrial fibrillation (HCC)   . CHF (congestive heart failure) (HCC)   . COPD (chronic obstructive pulmonary disease) (HCC)   . Coronary artery disease   . GERD (gastroesophageal reflux disease)   . Hypertension   . Myocardial infarction (HCC)   . Shortness of breath dyspnea     Social History   Socioeconomic History  . Marital status: Married    Spouse name: Not on file  . Number of children: Not on file  . Years of education: Not on file  . Highest education level: Not on file  Occupational History  . Not on file  Tobacco Use  . Smoking status: Former Smoker    Years: 25.00    Quit date: 03/20/1997    Years since quitting: 23.3  . Smokeless tobacco: Never Used  Vaping Use  . Vaping  Use: Never used  Substance and Sexual Activity  . Alcohol use: No  . Drug use: No  . Sexual activity: Not on file  Other Topics Concern  . Not on file  Social History Narrative  . Not on file   Social Determinants of Health   Financial Resource Strain:   . Difficulty of Paying Living Expenses: Not on file  Food Insecurity:   . Worried About Programme researcher, broadcasting/film/video in the Last Year: Not on file  . Ran Out of Food in the Last Year: Not on file  Transportation Needs:   . Lack of Transportation (Medical): Not on file  . Lack of Transportation (Non-Medical): Not on file  Physical Activity:   . Days of Exercise per Week: Not on file  . Minutes of Exercise per Session: Not on file  Stress:   . Feeling of Stress : Not on file  Social Connections:   . Frequency of Communication with [name] and Family: Not on file  . Frequency of Social Gatherings with [name] and  Family: Not on file  . Attends Religious Services: Not on file  . Active Member of Clubs or Organizations: Not on file  . Attends Banker Meetings: Not on file  . Marital Status: Not on file  Intimate Partner Violence:   . Fear of Current or Ex-Partner: Not on file  . Emotionally Abused: Not on file  . Physically Abused: Not on file  . Sexually Abused: Not on file    Past Surgical History:  Procedure Laterality Date  . ANGIOPLASTY  2009  . CARDIAC CATHETERIZATION N/A 03/18/2015   Procedure: Left Heart Cath with Coronary Angiography;  Surgeon: Lamar Blinks, MD;  Location: [facility] INVASIVE CV LAB;  Service: Cardiovascular;  Laterality: N/A;  . CHOLECYSTECTOMY  2005  . CORONARY ANGIOGRAM  2009    Family History  Problem Relation Age of Onset  . Coronary artery disease Father   . Diabetes Father   . Hyperlipidemia Father   . Hypertension Father     Allergies  Allergen Reactions  . Benadryl [Diphenhydramine Hcl] Shortness Of Breath  . Morphine And Related     CBC Latest Ref Rng & Units 02/19/2020 08/14/2017 08/13/2017  WBC 3.4 - 10.8 x10E3/uL 8.5 8.3 6.9  Hemoglobin 13.0 - 17.7 g/dL 53.6 14.4 31.5  Hematocrit 37.5 - 51.0 % 45.6 50.3 40.5  Platelets 150 - 450 x10E3/uL 345 329 220      CMP     Component Value Date/Time   NA 138 02/19/2020 0000   NA 135 (L) 07/19/2013 0417   K 4.0 02/19/2020 0000   K 3.9 07/19/2013 0417   CL 100 02/19/2020 0000   CL 101 07/19/2013 0417   CO2 24 02/19/2020 0000   CO2 29 07/19/2013 0417   GLUCOSE 106 (H) 02/19/2020 0000   GLUCOSE 170 (H) 08/14/2017 0527   GLUCOSE 86 07/19/2013 0417   BUN 14 02/19/2020 0000   BUN 10 07/19/2013 0417   CREATININE 0.87 02/19/2020 0000   CREATININE 0.80 07/19/2013 0417   CALCIUM 9.1 02/19/2020 0000   CALCIUM 8.4 (L) 07/19/2013 0417   PROT 6.3 02/19/2020 0000   PROT 6.6 07/19/2013 0417   ALBUMIN 4.2 02/19/2020 0000   ALBUMIN 2.8 (L) 07/19/2013 0417   AST 14 02/19/2020 0000   AST 33  07/19/2013 0417   ALT 12 02/19/2020 0000   ALT 32 07/19/2013 0417   ALKPHOS 81 02/19/2020 0000   ALKPHOS 85 07/19/2013 0417  BILITOT 0.4 02/19/2020 0000   BILITOT 1.6 (H) 07/19/2013 0417   GFRNONAA 91 02/19/2020 0000   GFRNONAA >60 07/19/2013 0417   GFRAA 105 02/19/2020 0000   GFRAA >60 07/19/2013 0417     VAS [place] ABI WITH/WO TBI  Result Date: 06/18/2020 LOWER EXTREMITY DOPPLER STUDY Indications: Rest pain, and ulceration.  Performing Technologist: Reece Agar RT (R)(VS)  Examination Guidelines: A complete evaluation includes at minimum, Doppler waveform signals and systolic blood pressure reading at the level of bilateral brachial, anterior tibial, and posterior tibial arteries, when vessel segments are accessible. Bilateral testing is considered an integral part of a complete examination. Photoelectric Plethysmograph (PPG) waveforms and toe systolic pressure readings are included as required and additional duplex testing as needed. Limited examinations for reoccurring indications may be performed as noted.  ABI Findings: +---------+------------------+-----+--------+--------+ Right    Rt Pressure (mmHg)IndexWaveformComment  +---------+------------------+-----+--------+--------+ Brachial 157                                     +---------+------------------+-----+--------+--------+ ATA      154               0.96 biphasic         +---------+------------------+-----+--------+--------+ PTA      153               0.96 biphasic         +---------+------------------+-----+--------+--------+ Great Toe162               1.01 Dampened         +---------+------------------+-----+--------+--------+ +---------+------------------+-----+--------+-------+ Left     Lt Pressure (mmHg)IndexWaveformComment +---------+------------------+-----+--------+-------+ Brachial 160                                    +---------+------------------+-----+--------+-------+ ATA      190                1.19 biphasic        +---------+------------------+-----+--------+-------+ PTA      185               1.16 biphasic        +---------+------------------+-----+--------+-------+ Great Toe144               0.90 Abnormal        +---------+------------------+-----+--------+-------+ Summary: Right: Resting right ankle-brachial index is within normal range. No evidence of significant right lower extremity arterial disease. The right toe-brachial index is normal. Left: Resting left ankle-brachial index is within normal range. No evidence of significant left lower extremity arterial disease. The left toe-brachial index is normal.  *See table(s) above for measurements and observations.  Electronically signed by Levora Dredge MD on 06/18/2020 at 3:42:45 PM.   Final        Assessment & Plan:   1. Venous ulcer (HCC) No surgery or intervention at this point in time.    I have had a long discussion with the patient regarding venous insufficiency and why it  causes symptoms, specifically venous ulceration . I have discussed with the patient the chronic skin changes that accompany venous insufficiency and the long term sequela such as infection and recurring  ulceration.  Patient will be placed in [employer] which will be changed weekly drainage permitting.  In addition, behavioral modification including several periods of elevation of the lower extremities during the day will be  continued. Achieving a position with the ankles at heart level was stressed to the patient   The patient will follow up in six weeks to reassess the degree of swelling and the control that Unna therapy is offering.   The patient can be assessed for graduated compression stockings or wraps as well as a Lymph Pump once the ulcers are healed.   2. Essential hypertension with goal blood pressure less than 130/80 Continue antihypertensive medications as already ordered, these medications have been reviewed and  there are no changes at this time.   3. Chronic obstructive pulmonary disease, unspecified COPD type (HCC) Continue pulmonary medications and aerosols as already ordered, these medications have been reviewed and there are no changes at this time.     Current Outpatient Medications on File Prior to Visit  Medication Sig Dispense Refill  . albuterol (VENTOLIN HFA) 108 (90 Base) MCG/ACT inhaler Inhale into the lungs every 6 (six) hours as needed for wheezing or shortness of breath.    [name]. aspirin 325 MG tablet Take 325 mg by mouth daily.    . budesonide-formoterol (SYMBICORT) 160-4.5 MCG/ACT inhaler Inhale 2 puffs into the lungs 2 (two) times daily.    [name]. diltiazem (CARDIZEM CD) 180 MG 24 hr capsule Take 1 capsule (180 mg total) by mouth daily. 30 capsule 0  . furosemide (LASIX) 20 MG tablet Take 20 mg by mouth daily.    [name]. ipratropium-albuterol (DUONEB) 0.5-2.5 (3) MG/3ML SOLN USE 1 VIAL VIA NEBULIZER EVERY 6 HOURS 360 mL 6  . loratadine (CLARITIN) 10 MG tablet Take 10 mg by mouth daily.    . meloxicam (MOBIC) 7.5 MG tablet Take 1 tablet (7.5 mg total) by mouth daily. 30 tablet 0  . OXYGEN Inhale into the lungs.    . TRELEGY ELLIPTA 100-62.5-25 MCG/INH AEPB Inhale 1 puff into the lungs daily.     No current facility-administered medications on file prior to visit.    There are no Patient Instructions on file for this visit. No follow-ups on file.   Georgiana SpinnerFallon E Evette Diclemente, NP

## 2020-07-13 NOTE — Telephone Encounter (Signed)
Home health orders were faxed to [facility] for bilateral unna wraps

## 2020-07-14 ENCOUNTER — Ambulatory Visit: Payer: Medicare Other | Admitting: Hospice and Palliative Medicine

## 2020-07-14 DIAGNOSIS — I872 Venous insufficiency (chronic) (peripheral): Secondary | ICD-10-CM | POA: Diagnosis not present

## 2020-07-14 DIAGNOSIS — Z87891 Personal history of nicotine dependence: Secondary | ICD-10-CM | POA: Diagnosis not present

## 2020-07-14 DIAGNOSIS — L97911 Non-pressure chronic ulcer of unspecified part of right lower leg limited to breakdown of skin: Secondary | ICD-10-CM | POA: Diagnosis not present

## 2020-07-14 DIAGNOSIS — I2510 Atherosclerotic heart disease of native coronary artery without angina pectoris: Secondary | ICD-10-CM | POA: Diagnosis not present

## 2020-07-14 DIAGNOSIS — I4891 Unspecified atrial fibrillation: Secondary | ICD-10-CM | POA: Diagnosis not present

## 2020-07-14 DIAGNOSIS — I110 Hypertensive heart disease with heart failure: Secondary | ICD-10-CM | POA: Diagnosis not present

## 2020-07-14 DIAGNOSIS — L97921 Non-pressure chronic ulcer of unspecified part of left lower leg limited to breakdown of skin: Secondary | ICD-10-CM | POA: Diagnosis not present

## 2020-07-14 DIAGNOSIS — I509 Heart failure, unspecified: Secondary | ICD-10-CM | POA: Diagnosis not present

## 2020-07-14 DIAGNOSIS — J449 Chronic obstructive pulmonary disease, unspecified: Secondary | ICD-10-CM | POA: Diagnosis not present

## 2020-07-14 DIAGNOSIS — M6281 Muscle weakness (generalized): Secondary | ICD-10-CM | POA: Diagnosis not present

## 2020-07-15 ENCOUNTER — Encounter (INDEPENDENT_AMBULATORY_CARE_PROVIDER_SITE_OTHER): Payer: Medicare Other

## 2020-07-16 ENCOUNTER — Ambulatory Visit: Payer: Medicare Other | Admitting: Hospice and Palliative Medicine

## 2020-07-17 DIAGNOSIS — L97911 Non-pressure chronic ulcer of unspecified part of right lower leg limited to breakdown of skin: Secondary | ICD-10-CM | POA: Diagnosis not present

## 2020-07-17 DIAGNOSIS — J449 Chronic obstructive pulmonary disease, unspecified: Secondary | ICD-10-CM | POA: Diagnosis not present

## 2020-07-17 DIAGNOSIS — Z87891 Personal history of nicotine dependence: Secondary | ICD-10-CM | POA: Diagnosis not present

## 2020-07-17 DIAGNOSIS — I110 Hypertensive heart disease with heart failure: Secondary | ICD-10-CM | POA: Diagnosis not present

## 2020-07-17 DIAGNOSIS — L97921 Non-pressure chronic ulcer of unspecified part of left lower leg limited to breakdown of skin: Secondary | ICD-10-CM | POA: Diagnosis not present

## 2020-07-17 DIAGNOSIS — I4891 Unspecified atrial fibrillation: Secondary | ICD-10-CM | POA: Diagnosis not present

## 2020-07-17 DIAGNOSIS — I872 Venous insufficiency (chronic) (peripheral): Secondary | ICD-10-CM | POA: Diagnosis not present

## 2020-07-17 DIAGNOSIS — I2510 Atherosclerotic heart disease of native coronary artery without angina pectoris: Secondary | ICD-10-CM | POA: Diagnosis not present

## 2020-07-17 DIAGNOSIS — M6281 Muscle weakness (generalized): Secondary | ICD-10-CM | POA: Diagnosis not present

## 2020-07-17 DIAGNOSIS — L97909 Non-pressure chronic ulcer of unspecified part of unspecified lower leg with unspecified severity: Secondary | ICD-10-CM | POA: Diagnosis not present

## 2020-07-17 DIAGNOSIS — I509 Heart failure, unspecified: Secondary | ICD-10-CM | POA: Diagnosis not present

## 2020-07-18 DIAGNOSIS — J439 Emphysema, unspecified: Secondary | ICD-10-CM | POA: Diagnosis not present

## 2020-07-18 DIAGNOSIS — J449 Chronic obstructive pulmonary disease, unspecified: Secondary | ICD-10-CM | POA: Diagnosis not present

## 2020-07-22 ENCOUNTER — Encounter (INDEPENDENT_AMBULATORY_CARE_PROVIDER_SITE_OTHER): Payer: Medicare Other

## 2020-07-22 ENCOUNTER — Telehealth (INDEPENDENT_AMBULATORY_CARE_PROVIDER_SITE_OTHER): Payer: Self-pay

## 2020-07-22 DIAGNOSIS — I4891 Unspecified atrial fibrillation: Secondary | ICD-10-CM | POA: Diagnosis not present

## 2020-07-22 DIAGNOSIS — Z87891 Personal history of nicotine dependence: Secondary | ICD-10-CM | POA: Diagnosis not present

## 2020-07-22 DIAGNOSIS — I110 Hypertensive heart disease with heart failure: Secondary | ICD-10-CM | POA: Diagnosis not present

## 2020-07-22 DIAGNOSIS — L97911 Non-pressure chronic ulcer of unspecified part of right lower leg limited to breakdown of skin: Secondary | ICD-10-CM | POA: Diagnosis not present

## 2020-07-22 DIAGNOSIS — I509 Heart failure, unspecified: Secondary | ICD-10-CM | POA: Diagnosis not present

## 2020-07-22 DIAGNOSIS — J449 Chronic obstructive pulmonary disease, unspecified: Secondary | ICD-10-CM | POA: Diagnosis not present

## 2020-07-22 DIAGNOSIS — I872 Venous insufficiency (chronic) (peripheral): Secondary | ICD-10-CM | POA: Diagnosis not present

## 2020-07-22 DIAGNOSIS — M6281 Muscle weakness (generalized): Secondary | ICD-10-CM | POA: Diagnosis not present

## 2020-07-22 DIAGNOSIS — I2510 Atherosclerotic heart disease of native coronary artery without angina pectoris: Secondary | ICD-10-CM | POA: Diagnosis not present

## 2020-07-22 DIAGNOSIS — L97921 Non-pressure chronic ulcer of unspecified part of left lower leg limited to breakdown of skin: Secondary | ICD-10-CM | POA: Diagnosis not present

## 2020-07-22 NOTE — Telephone Encounter (Signed)
Left Vm for Pt's home care Nurse to call back with information about patient.

## 2020-07-24 ENCOUNTER — Telehealth (INDEPENDENT_AMBULATORY_CARE_PROVIDER_SITE_OTHER): Payer: Self-pay

## 2020-07-24 NOTE — Telephone Encounter (Signed)
I called and made the PT aware of Dr. Driscilla Grammes instructions.

## 2020-07-24 NOTE — Telephone Encounter (Signed)
The pt's PT therapist called and left a VM say that per the pt's request the pt's PT evaluation will be put off until after the holiday DEC 26- JAN 1. Please advise if this is ok.

## 2020-07-27 ENCOUNTER — Ambulatory Visit: Payer: Medicare Other | Admitting: Hospice and Palliative Medicine

## 2020-07-28 DIAGNOSIS — I110 Hypertensive heart disease with heart failure: Secondary | ICD-10-CM | POA: Diagnosis not present

## 2020-07-28 DIAGNOSIS — I509 Heart failure, unspecified: Secondary | ICD-10-CM | POA: Diagnosis not present

## 2020-07-28 DIAGNOSIS — I872 Venous insufficiency (chronic) (peripheral): Secondary | ICD-10-CM | POA: Diagnosis not present

## 2020-07-28 DIAGNOSIS — I4891 Unspecified atrial fibrillation: Secondary | ICD-10-CM | POA: Diagnosis not present

## 2020-07-28 DIAGNOSIS — Z87891 Personal history of nicotine dependence: Secondary | ICD-10-CM | POA: Diagnosis not present

## 2020-07-28 DIAGNOSIS — L97911 Non-pressure chronic ulcer of unspecified part of right lower leg limited to breakdown of skin: Secondary | ICD-10-CM | POA: Diagnosis not present

## 2020-07-28 DIAGNOSIS — I2510 Atherosclerotic heart disease of native coronary artery without angina pectoris: Secondary | ICD-10-CM | POA: Diagnosis not present

## 2020-07-28 DIAGNOSIS — J449 Chronic obstructive pulmonary disease, unspecified: Secondary | ICD-10-CM | POA: Diagnosis not present

## 2020-07-28 DIAGNOSIS — M6281 Muscle weakness (generalized): Secondary | ICD-10-CM | POA: Diagnosis not present

## 2020-07-28 DIAGNOSIS — L97921 Non-pressure chronic ulcer of unspecified part of left lower leg limited to breakdown of skin: Secondary | ICD-10-CM | POA: Diagnosis not present

## 2020-07-29 ENCOUNTER — Ambulatory Visit (INDEPENDENT_AMBULATORY_CARE_PROVIDER_SITE_OTHER): Payer: Medicare Other | Admitting: Nurse Practitioner

## 2020-07-29 ENCOUNTER — Ambulatory Visit (INDEPENDENT_AMBULATORY_CARE_PROVIDER_SITE_OTHER): Payer: Medicare Other | Admitting: Hospice and Palliative Medicine

## 2020-07-29 ENCOUNTER — Other Ambulatory Visit: Payer: Self-pay | Admitting: Adult Health

## 2020-07-29 ENCOUNTER — Encounter: Payer: Self-pay | Admitting: Hospice and Palliative Medicine

## 2020-07-29 VITALS — HR 99 | Temp 97.8°F | Resp 16 | Ht 70.0 in | Wt 218.0 lb

## 2020-07-29 DIAGNOSIS — J441 Chronic obstructive pulmonary disease with (acute) exacerbation: Secondary | ICD-10-CM

## 2020-07-29 DIAGNOSIS — J9611 Chronic respiratory failure with hypoxia: Secondary | ICD-10-CM

## 2020-07-29 DIAGNOSIS — Z9981 Dependence on supplemental oxygen: Secondary | ICD-10-CM

## 2020-07-29 DIAGNOSIS — R0902 Hypoxemia: Secondary | ICD-10-CM

## 2020-07-29 MED ORDER — PREDNISONE 10 MG PO TABS: ORAL_TABLET | ORAL | 0 refills | Status: DC

## 2020-07-29 MED ORDER — AZITHROMYCIN 250 MG PO TABS: ORAL_TABLET | ORAL | 0 refills | Status: DC

## 2020-07-29 NOTE — Progress Notes (Signed)
[facility] [address] [place], [place] [address]  Internal MEDICINE  Telephone Visit  Patient Name: Erik [name]  [id number]  [id number]  Date of Service: 08/07/2020  I connected with the patient at 1229 by telephone and verified the patients identity using two identifiers.   I discussed the limitations, risks, security and privacy concerns of performing an evaluation and management service by telephone and the availability of in person appointments. I also discussed with the patient that there may be a patient responsible charge related to the service.  The patient expressed understanding and agrees to proceed.    Chief Complaint  Patient presents with  . Acute Visit    Breathing issues, coughing started couple days ago but it got worse last night   . Asthma  . COPD  . Hypertension  . Gastroesophageal Reflux  . Telephone Assessment    [phone]  . Telephone Screen    Video or phone    HPI Patient is being seen via virtual visit or acute visit Complaining today of increased shortness of breath, chest congestion, wheezing as well as productive coughing Unable to sleep last night due to coughing Continues to wear 2-3 LPM supplemental oxygen therapy at all times Reports SpO2 levels remain stable  History of COPD and chronic respiratory failure Last PFT--very severe obstructive lung disease   Current Medication: Outpatient Encounter Medications as of 07/29/2020  Medication Sig  . albuterol (VENTOLIN HFA) 108 (90 Base) MCG/ACT inhaler Inhale into the lungs every 6 (six) hours as needed for wheezing or shortness of breath.  [name] aspirin 325 MG tablet Take 325 mg by mouth daily.  [name] azithromycin (ZITHROMAX) 250 MG tablet Take one tab po qd for 14 days  . budesonide-formoterol (SYMBICORT) 160-4.5 MCG/ACT inhaler Inhale 2 puffs into the lungs 2 (two) times daily.  [name] diltiazem (CARDIZEM CD) 180 MG 24 hr capsule Take 1 capsule (180 mg total) by mouth daily.  .  furosemide (LASIX) 20 MG tablet Take 20 mg by mouth daily.  [name] loratadine (CLARITIN) 10 MG tablet Take 10 mg by mouth daily.  . meloxicam (MOBIC) 7.5 MG tablet Take 1 tablet (7.5 mg total) by mouth daily.  . OXYGEN Inhale into the lungs.  . predniSONE (DELTASONE) 10 MG tablet Take one tablet three times daily for one day. Take one tablet twice daily for two days. Take one tablet daily for two days.  . TRELEGY ELLIPTA 100-62.5-25 MCG/INH AEPB Inhale 1 puff into the lungs daily.  . [DISCONTINUED] ipratropium-albuterol (DUONEB) 0.5-2.5 (3) MG/3ML SOLN USE 1 VIAL VIA NEBULIZER EVERY 6 HOURS   No facility-administered encounter medications on file as of 07/29/2020.    Surgical History: Past Surgical History:  Procedure Laterality Date  . ANGIOPLASTY  2009  . CARDIAC CATHETERIZATION N/A 03/18/2015   Procedure: Left Heart Cath with Coronary Angiography;  Surgeon: Lamar Blinks, MD;  Location: [facility] INVASIVE CV LAB;  Service: Cardiovascular;  Laterality: N/A;  . CHOLECYSTECTOMY  2005  . CORONARY ANGIOGRAM  2009    Medical History: Past Medical History:  Diagnosis Date  . Arthritis   . Asthma   . Atrial fibrillation (HCC)   . CHF (congestive heart failure) (HCC)   . COPD (chronic obstructive pulmonary disease) (HCC)   . Coronary artery disease   . GERD (gastroesophageal reflux disease)   . Hypertension   . Myocardial infarction (HCC)   . Shortness of breath dyspnea     Family History: Family History  Problem Relation Age  of Onset  . Coronary artery disease Father   . Diabetes Father   . Hyperlipidemia Father   . Hypertension Father     Social History   Socioeconomic History  . Marital status: Married    Spouse name: Not on file  . Number of children: Not on file  . Years of education: Not on file  . Highest education level: Not on file  Occupational History  . Not on file  Tobacco Use  . Smoking status: Former Smoker    Years: 25.00    Quit date: 03/20/1997    Years  since quitting: 23.4  . Smokeless tobacco: Never Used  Vaping Use  . Vaping Use: Never used  Substance and Sexual Activity  . Alcohol use: No  . Drug use: No  . Sexual activity: Not on file  Other Topics Concern  . Not on file  Social History Narrative  . Not on file   Social Determinants of Health   Financial Resource Strain: Not on file  Food Insecurity: Not on file  Transportation Needs: Not on file  Physical Activity: Not on file  Stress: Not on file  Social Connections: Not on file  Intimate Partner Violence: Not on file    Review of Systems  Constitutional: Negative for chills, fatigue and unexpected weight change.  HENT: Negative for congestion, postnasal drip, rhinorrhea, sneezing and sore throat.   Eyes: Negative for redness.  Respiratory: Positive for cough, shortness of breath and wheezing. Negative for chest tightness.   Cardiovascular: Negative for chest pain, palpitations and leg swelling.  Gastrointestinal: Negative for abdominal pain, constipation, diarrhea, nausea and vomiting.  Genitourinary: Negative for dysuria and frequency.  Musculoskeletal: Negative for arthralgias, back pain, joint swelling and neck pain.  Skin: Negative for rash.  Neurological: Negative for tremors and numbness.  Hematological: Negative for adenopathy. Does not bruise/bleed easily.  Psychiatric/Behavioral: Negative for behavioral problems (Depression), sleep disturbance and suicidal ideas. The patient is not nervous/anxious.     Vital Signs: Pulse 99   Temp 97.8 F (36.6 C)   Resp 16   Ht 5\' 10"  (1.778 m)   Wt 218 lb (98.9 kg)   SpO2 96%   BMI 31.28 kg/m    Observation/Objective: Congestion and coughing noted, no acute distress observed or respiratory distress.  Assessment/Plan: 1. Acute exacerbation of chronic obstructive pulmonary disease (COPD) (HCC) Start extended course of azithromycin, may need additional round of therapy Start low dose and short course  prednisone to avoid hospitalization around holidays May benefit from starting Daliresp - azithromycin (ZITHROMAX) 250 MG tablet; Take one tab po qd for 14 days  Dispense: 14 tablet; Refill: 0 - predniSONE (DELTASONE) 10 MG tablet; Take one tablet three times daily for one day. Take one tablet twice daily for two days. Take one tablet daily for two days.  Dispense: 9 tablet; Refill: 0  2. Chronic respiratory failure with hypoxia (HCC) Continue with current supplemental therapies at this time  3. Supplemental oxygen dependent Continue with current oxygen settings as SpO2 levels remain stable, continue to closely monitor  General Counseling: Justun verbalizes understanding of the findings of today's phone visit and agrees with plan of treatment. I have discussed any further diagnostic evaluation that may be needed or ordered today. We also reviewed his medications today. he has been encouraged to call the office with any questions or concerns that should arise related to todays visit.   Meds ordered this encounter  Medications  . azithromycin (ZITHROMAX) 250 MG  tablet    Sig: Take one tab po qd for 14 days    Dispense:  14 tablet    Refill:  0  . predniSONE (DELTASONE) 10 MG tablet    Sig: Take one tablet three times daily for one day. Take one tablet twice daily for two days. Take one tablet daily for two days.    Dispense:  9 tablet    Refill:  0     Time spent: 30 Minutes Time spent includes review of chart, medications, test results and follow-up plan with the patient.  Lubertha Basque Skarlette Lattner AGNP-C Internal medicine

## 2020-07-30 ENCOUNTER — Other Ambulatory Visit: Payer: Self-pay

## 2020-07-30 DIAGNOSIS — R0902 Hypoxemia: Secondary | ICD-10-CM

## 2020-07-30 MED ORDER — IPRATROPIUM-ALBUTEROL 0.5-2.5 (3) MG/3ML IN SOLN: RESPIRATORY_TRACT | 6 refills | Status: DC

## 2020-08-04 ENCOUNTER — Ambulatory Visit: Payer: Medicare Other | Admitting: Internal Medicine

## 2020-08-04 DIAGNOSIS — M6281 Muscle weakness (generalized): Secondary | ICD-10-CM | POA: Diagnosis not present

## 2020-08-04 DIAGNOSIS — L97921 Non-pressure chronic ulcer of unspecified part of left lower leg limited to breakdown of skin: Secondary | ICD-10-CM | POA: Diagnosis not present

## 2020-08-04 DIAGNOSIS — I110 Hypertensive heart disease with heart failure: Secondary | ICD-10-CM | POA: Diagnosis not present

## 2020-08-04 DIAGNOSIS — Z87891 Personal history of nicotine dependence: Secondary | ICD-10-CM | POA: Diagnosis not present

## 2020-08-04 DIAGNOSIS — J449 Chronic obstructive pulmonary disease, unspecified: Secondary | ICD-10-CM | POA: Diagnosis not present

## 2020-08-04 DIAGNOSIS — I2510 Atherosclerotic heart disease of native coronary artery without angina pectoris: Secondary | ICD-10-CM | POA: Diagnosis not present

## 2020-08-04 DIAGNOSIS — I509 Heart failure, unspecified: Secondary | ICD-10-CM | POA: Diagnosis not present

## 2020-08-04 DIAGNOSIS — I872 Venous insufficiency (chronic) (peripheral): Secondary | ICD-10-CM | POA: Diagnosis not present

## 2020-08-04 DIAGNOSIS — I4891 Unspecified atrial fibrillation: Secondary | ICD-10-CM | POA: Diagnosis not present

## 2020-08-04 DIAGNOSIS — L97911 Non-pressure chronic ulcer of unspecified part of right lower leg limited to breakdown of skin: Secondary | ICD-10-CM | POA: Diagnosis not present

## 2020-08-05 DIAGNOSIS — M6281 Muscle weakness (generalized): Secondary | ICD-10-CM | POA: Diagnosis not present

## 2020-08-05 DIAGNOSIS — J449 Chronic obstructive pulmonary disease, unspecified: Secondary | ICD-10-CM | POA: Diagnosis not present

## 2020-08-05 DIAGNOSIS — I4891 Unspecified atrial fibrillation: Secondary | ICD-10-CM | POA: Diagnosis not present

## 2020-08-05 DIAGNOSIS — I110 Hypertensive heart disease with heart failure: Secondary | ICD-10-CM | POA: Diagnosis not present

## 2020-08-05 DIAGNOSIS — Z87891 Personal history of nicotine dependence: Secondary | ICD-10-CM | POA: Diagnosis not present

## 2020-08-05 DIAGNOSIS — I2510 Atherosclerotic heart disease of native coronary artery without angina pectoris: Secondary | ICD-10-CM | POA: Diagnosis not present

## 2020-08-05 DIAGNOSIS — I509 Heart failure, unspecified: Secondary | ICD-10-CM | POA: Diagnosis not present

## 2020-08-05 DIAGNOSIS — L97911 Non-pressure chronic ulcer of unspecified part of right lower leg limited to breakdown of skin: Secondary | ICD-10-CM | POA: Diagnosis not present

## 2020-08-05 DIAGNOSIS — L97921 Non-pressure chronic ulcer of unspecified part of left lower leg limited to breakdown of skin: Secondary | ICD-10-CM | POA: Diagnosis not present

## 2020-08-05 DIAGNOSIS — I872 Venous insufficiency (chronic) (peripheral): Secondary | ICD-10-CM | POA: Diagnosis not present

## 2020-08-07 ENCOUNTER — Encounter: Payer: Self-pay | Admitting: Hospice and Palliative Medicine

## 2020-08-12 DIAGNOSIS — I509 Heart failure, unspecified: Secondary | ICD-10-CM | POA: Diagnosis not present

## 2020-08-12 DIAGNOSIS — M6281 Muscle weakness (generalized): Secondary | ICD-10-CM | POA: Diagnosis not present

## 2020-08-12 DIAGNOSIS — L97911 Non-pressure chronic ulcer of unspecified part of right lower leg limited to breakdown of skin: Secondary | ICD-10-CM | POA: Diagnosis not present

## 2020-08-12 DIAGNOSIS — I872 Venous insufficiency (chronic) (peripheral): Secondary | ICD-10-CM | POA: Diagnosis not present

## 2020-08-12 DIAGNOSIS — I4891 Unspecified atrial fibrillation: Secondary | ICD-10-CM | POA: Diagnosis not present

## 2020-08-12 DIAGNOSIS — I2510 Atherosclerotic heart disease of native coronary artery without angina pectoris: Secondary | ICD-10-CM | POA: Diagnosis not present

## 2020-08-12 DIAGNOSIS — J449 Chronic obstructive pulmonary disease, unspecified: Secondary | ICD-10-CM | POA: Diagnosis not present

## 2020-08-12 DIAGNOSIS — L97921 Non-pressure chronic ulcer of unspecified part of left lower leg limited to breakdown of skin: Secondary | ICD-10-CM | POA: Diagnosis not present

## 2020-08-12 DIAGNOSIS — Z87891 Personal history of nicotine dependence: Secondary | ICD-10-CM | POA: Diagnosis not present

## 2020-08-12 DIAGNOSIS — I110 Hypertensive heart disease with heart failure: Secondary | ICD-10-CM | POA: Diagnosis not present

## 2020-08-14 DIAGNOSIS — L97909 Non-pressure chronic ulcer of unspecified part of unspecified lower leg with unspecified severity: Secondary | ICD-10-CM | POA: Diagnosis not present

## 2020-08-17 ENCOUNTER — Ambulatory Visit (INDEPENDENT_AMBULATORY_CARE_PROVIDER_SITE_OTHER): Payer: Medicare Other | Admitting: Nurse Practitioner

## 2020-08-18 DIAGNOSIS — J449 Chronic obstructive pulmonary disease, unspecified: Secondary | ICD-10-CM | POA: Diagnosis not present

## 2020-08-18 DIAGNOSIS — J439 Emphysema, unspecified: Secondary | ICD-10-CM | POA: Diagnosis not present

## 2020-08-19 DIAGNOSIS — I2510 Atherosclerotic heart disease of native coronary artery without angina pectoris: Secondary | ICD-10-CM | POA: Diagnosis not present

## 2020-08-19 DIAGNOSIS — L97921 Non-pressure chronic ulcer of unspecified part of left lower leg limited to breakdown of skin: Secondary | ICD-10-CM | POA: Diagnosis not present

## 2020-08-19 DIAGNOSIS — I4891 Unspecified atrial fibrillation: Secondary | ICD-10-CM | POA: Diagnosis not present

## 2020-08-19 DIAGNOSIS — J449 Chronic obstructive pulmonary disease, unspecified: Secondary | ICD-10-CM | POA: Diagnosis not present

## 2020-08-19 DIAGNOSIS — I509 Heart failure, unspecified: Secondary | ICD-10-CM | POA: Diagnosis not present

## 2020-08-19 DIAGNOSIS — I110 Hypertensive heart disease with heart failure: Secondary | ICD-10-CM | POA: Diagnosis not present

## 2020-08-19 DIAGNOSIS — Z87891 Personal history of nicotine dependence: Secondary | ICD-10-CM | POA: Diagnosis not present

## 2020-08-19 DIAGNOSIS — I872 Venous insufficiency (chronic) (peripheral): Secondary | ICD-10-CM | POA: Diagnosis not present

## 2020-08-19 DIAGNOSIS — M6281 Muscle weakness (generalized): Secondary | ICD-10-CM | POA: Diagnosis not present

## 2020-08-19 DIAGNOSIS — L97911 Non-pressure chronic ulcer of unspecified part of right lower leg limited to breakdown of skin: Secondary | ICD-10-CM | POA: Diagnosis not present

## 2020-08-20 ENCOUNTER — Telehealth: Payer: Self-pay

## 2020-08-20 ENCOUNTER — Other Ambulatory Visit: Payer: Self-pay | Admitting: Hospice and Palliative Medicine

## 2020-08-20 DIAGNOSIS — M6281 Muscle weakness (generalized): Secondary | ICD-10-CM | POA: Diagnosis not present

## 2020-08-20 DIAGNOSIS — Z87891 Personal history of nicotine dependence: Secondary | ICD-10-CM | POA: Diagnosis not present

## 2020-08-20 DIAGNOSIS — I509 Heart failure, unspecified: Secondary | ICD-10-CM | POA: Diagnosis not present

## 2020-08-20 DIAGNOSIS — J449 Chronic obstructive pulmonary disease, unspecified: Secondary | ICD-10-CM | POA: Diagnosis not present

## 2020-08-20 DIAGNOSIS — I2510 Atherosclerotic heart disease of native coronary artery without angina pectoris: Secondary | ICD-10-CM | POA: Diagnosis not present

## 2020-08-20 DIAGNOSIS — L97921 Non-pressure chronic ulcer of unspecified part of left lower leg limited to breakdown of skin: Secondary | ICD-10-CM | POA: Diagnosis not present

## 2020-08-20 DIAGNOSIS — I110 Hypertensive heart disease with heart failure: Secondary | ICD-10-CM | POA: Diagnosis not present

## 2020-08-20 DIAGNOSIS — I872 Venous insufficiency (chronic) (peripheral): Secondary | ICD-10-CM | POA: Diagnosis not present

## 2020-08-20 DIAGNOSIS — I4891 Unspecified atrial fibrillation: Secondary | ICD-10-CM | POA: Diagnosis not present

## 2020-08-20 DIAGNOSIS — L97911 Non-pressure chronic ulcer of unspecified part of right lower leg limited to breakdown of skin: Secondary | ICD-10-CM | POA: Diagnosis not present

## 2020-08-20 NOTE — Telephone Encounter (Signed)
lmom to call us back regarding nurse from [facility] left message regarding his BP and nurse left her phone she left pt phone no

## 2020-08-21 ENCOUNTER — Telehealth: Payer: Self-pay

## 2020-08-21 NOTE — Telephone Encounter (Signed)
Spoke with pt he is doing good his BP was 109/80 and advised him to keep follow up

## 2020-08-25 ENCOUNTER — Encounter: Payer: Self-pay | Admitting: Internal Medicine

## 2020-08-25 ENCOUNTER — Telehealth: Payer: Self-pay

## 2020-08-25 ENCOUNTER — Ambulatory Visit (INDEPENDENT_AMBULATORY_CARE_PROVIDER_SITE_OTHER): Payer: Medicare Other | Admitting: Internal Medicine

## 2020-08-25 VITALS — BP 135/80 | HR 95 | Ht 70.0 in | Wt 214.0 lb

## 2020-08-25 DIAGNOSIS — J441 Chronic obstructive pulmonary disease with (acute) exacerbation: Secondary | ICD-10-CM | POA: Diagnosis not present

## 2020-08-25 DIAGNOSIS — Z9981 Dependence on supplemental oxygen: Secondary | ICD-10-CM | POA: Diagnosis not present

## 2020-08-25 DIAGNOSIS — R682 Dry mouth, unspecified: Secondary | ICD-10-CM

## 2020-08-25 DIAGNOSIS — J9611 Chronic respiratory failure with hypoxia: Secondary | ICD-10-CM

## 2020-08-25 MED ORDER — HUMIDIFIER MISC: 0 refills | Status: DC

## 2020-08-25 NOTE — Telephone Encounter (Signed)
Gave [facility] orders for nebulizer and service oxygen equipment. Erik [name]

## 2020-08-25 NOTE — Progress Notes (Signed)
[facility] [address] [place], [place] [address]  Internal MEDICINE  Telephone Visit  Patient Name: Erik [name]  [id number]  [id number]  Date of Service: 08/25/2020  I connected with the patient at  by telephone and verified the patients identity using two identifiers.   I discussed the limitations, risks, security and privacy concerns of performing an evaluation and management service by telephone and the availability of in person appointments. I also discussed with the patient that there may be a patient responsible charge related to the service.  The patient expressed understanding and agrees to proceed.    Chief Complaint  Patient presents with  . Telephone Assessment    [phone]  . Telephone Screen  . COPD  . Hypertension  . Congestive Heart Failure  . Sinusitis  . Sore Throat  . Cough    HPI Pt is connected via tele-visit for acute visit. His breathing is better however he is c/o dry mouth all the time. He is on oxygen therapy for end stage lung disease. There is no humidifier with O2. He has been treated last month for acute COPD exacerbation    Current Medication: Outpatient Encounter Medications as of 08/25/2020  Medication Sig  . albuterol (VENTOLIN HFA) 108 (90 Base) MCG/ACT inhaler Inhale into the lungs every 6 (six) hours as needed for wheezing or shortness of breath.  [name] aspirin 325 MG tablet Take 325 mg by mouth daily.  . budesonide-formoterol (SYMBICORT) 160-4.5 MCG/ACT inhaler Inhale 2 puffs into the lungs 2 (two) times daily.  [name] diltiazem (CARDIZEM CD) 180 MG 24 hr capsule Take 1 capsule (180 mg total) by mouth daily.  . furosemide (LASIX) 20 MG tablet Take 20 mg by mouth daily.  . Humidifier MISC Use with O2 at all times  . ipratropium-albuterol (DUONEB) 0.5-2.5 (3) MG/3ML SOLN USE 1 VIAL VIA NEBULIZER EVERY 6 HOURS  . loratadine (CLARITIN) 10 MG tablet Take 10 mg by mouth daily.  . meloxicam (MOBIC) 7.5 MG tablet Take 1 tablet (7.5 mg  total) by mouth daily.  . OXYGEN Inhale into the lungs.  . TRELEGY ELLIPTA 100-62.5-25 MCG/INH AEPB Inhale 1 puff into the lungs daily.  . [DISCONTINUED] azithromycin (ZITHROMAX) 250 MG tablet Take one tab po qd for 14 days  . [DISCONTINUED] predniSONE (DELTASONE) 10 MG tablet Take one tablet three times daily for one day. Take one tablet twice daily for two days. Take one tablet daily for two days.   No facility-administered encounter medications on file as of 08/25/2020.    Surgical History: Past Surgical History:  Procedure Laterality Date  . ANGIOPLASTY  2009  . CARDIAC CATHETERIZATION N/A 03/18/2015   Procedure: Left Heart Cath with Coronary Angiography;  Surgeon: Lamar Blinks, MD;  Location: [facility] INVASIVE CV LAB;  Service: Cardiovascular;  Laterality: N/A;  . CHOLECYSTECTOMY  2005  . CORONARY ANGIOGRAM  2009    Medical History: Past Medical History:  Diagnosis Date  . Arthritis   . Asthma   . Atrial fibrillation (HCC)   . CHF (congestive heart failure) (HCC)   . COPD (chronic obstructive pulmonary disease) (HCC)   . Coronary artery disease   . GERD (gastroesophageal reflux disease)   . Hypertension   . Myocardial infarction (HCC)   . Shortness of breath dyspnea     Family History: Family History  Problem Relation Age of Onset  . Coronary artery disease Father   . Diabetes Father   . Hyperlipidemia Father   . Hypertension Father  Social History   Socioeconomic History  . Marital status: Married    Spouse name: Not on file  . Number of children: Not on file  . Years of education: Not on file  . Highest education level: Not on file  Occupational History  . Not on file  Tobacco Use  . Smoking status: Former Smoker    Years: 25.00    Quit date: 03/20/1997    Years since quitting: 23.4  . Smokeless tobacco: Never Used  Vaping Use  . Vaping Use: Never used  Substance and Sexual Activity  . Alcohol use: No  . Drug use: No  . Sexual activity: Not on  file  Other Topics Concern  . Not on file  Social History Narrative  . Not on file   Social Determinants of Health   Financial Resource Strain: Not on file  Food Insecurity: Not on file  Transportation Needs: Not on file  Physical Activity: Not on file  Stress: Not on file  Social Connections: Not on file  Intimate Partner Violence: Not on file      Review of Systems  Constitutional: Negative for fatigue and fever.  HENT: Negative for congestion, mouth sores and postnasal drip.        Dry mouth   Respiratory: Positive for shortness of breath. Negative for cough.   Cardiovascular: Negative.  Negative for chest pain.  Genitourinary: Negative for flank pain.  Musculoskeletal: Negative.   Skin: Negative.   Psychiatric/Behavioral: Negative.      Vital Signs: BP 135/80   Pulse 95   Ht 5\' 10"  (1.778 m)   Wt 214 lb (97.1 kg)   SpO2 91% Comment: 3 litre  BMI 30.71 kg/m    Observation/Objective: NAD, breathing improved     Assessment/Plan: 1. Dry mouth Can be side effects of Anticholinergic inhalers and constantbuse of O2, will add humidifier    - Humidifier MISC; Use with O2 at all times  Dispense: 1 each; Refill: 0  2. Chronic respiratory failure with hypoxia (HCC) Stable with no decompensation   3. Supplemental oxygen dependent - Humidifier MISC; Use with O2 at all times  Dispense: 1 each; Refill: 0  4. Acute exacerbation of chronic obstructive pulmonary disease (COPD) (HCC) Continue therapy, might need to Add Daliresp in future to prevent further flare ups   General Counseling: Erik [name] verbalizes understanding of the findings of today's phone visit and agrees with plan of treatment. I have discussed any further diagnostic evaluation that may be needed or ordered today. We also reviewed his medications today. he has been encouraged to call the office with any questions or concerns that should arise related to todays visit.   Meds ordered this encounter   Medications  . Humidifier MISC    Sig: Use with O2 at all times    Dispense:  1 each    Refill:  0    Time spent:20Minutes    Dr Internal medicine

## 2020-08-27 DIAGNOSIS — I4891 Unspecified atrial fibrillation: Secondary | ICD-10-CM | POA: Diagnosis not present

## 2020-08-27 DIAGNOSIS — I509 Heart failure, unspecified: Secondary | ICD-10-CM | POA: Diagnosis not present

## 2020-08-27 DIAGNOSIS — L97911 Non-pressure chronic ulcer of unspecified part of right lower leg limited to breakdown of skin: Secondary | ICD-10-CM | POA: Diagnosis not present

## 2020-08-27 DIAGNOSIS — I872 Venous insufficiency (chronic) (peripheral): Secondary | ICD-10-CM | POA: Diagnosis not present

## 2020-08-27 DIAGNOSIS — J449 Chronic obstructive pulmonary disease, unspecified: Secondary | ICD-10-CM | POA: Diagnosis not present

## 2020-08-27 DIAGNOSIS — Z87891 Personal history of nicotine dependence: Secondary | ICD-10-CM | POA: Diagnosis not present

## 2020-08-27 DIAGNOSIS — I110 Hypertensive heart disease with heart failure: Secondary | ICD-10-CM | POA: Diagnosis not present

## 2020-08-27 DIAGNOSIS — I2510 Atherosclerotic heart disease of native coronary artery without angina pectoris: Secondary | ICD-10-CM | POA: Diagnosis not present

## 2020-08-27 DIAGNOSIS — M6281 Muscle weakness (generalized): Secondary | ICD-10-CM | POA: Diagnosis not present

## 2020-08-27 DIAGNOSIS — L97921 Non-pressure chronic ulcer of unspecified part of left lower leg limited to breakdown of skin: Secondary | ICD-10-CM | POA: Diagnosis not present

## 2020-08-28 DIAGNOSIS — L97911 Non-pressure chronic ulcer of unspecified part of right lower leg limited to breakdown of skin: Secondary | ICD-10-CM | POA: Diagnosis not present

## 2020-08-28 DIAGNOSIS — I872 Venous insufficiency (chronic) (peripheral): Secondary | ICD-10-CM | POA: Diagnosis not present

## 2020-08-28 DIAGNOSIS — I509 Heart failure, unspecified: Secondary | ICD-10-CM | POA: Diagnosis not present

## 2020-08-28 DIAGNOSIS — I110 Hypertensive heart disease with heart failure: Secondary | ICD-10-CM | POA: Diagnosis not present

## 2020-08-28 DIAGNOSIS — Z87891 Personal history of nicotine dependence: Secondary | ICD-10-CM | POA: Diagnosis not present

## 2020-08-28 DIAGNOSIS — L97921 Non-pressure chronic ulcer of unspecified part of left lower leg limited to breakdown of skin: Secondary | ICD-10-CM | POA: Diagnosis not present

## 2020-08-28 DIAGNOSIS — J449 Chronic obstructive pulmonary disease, unspecified: Secondary | ICD-10-CM | POA: Diagnosis not present

## 2020-08-28 DIAGNOSIS — I2510 Atherosclerotic heart disease of native coronary artery without angina pectoris: Secondary | ICD-10-CM | POA: Diagnosis not present

## 2020-08-28 DIAGNOSIS — I4891 Unspecified atrial fibrillation: Secondary | ICD-10-CM | POA: Diagnosis not present

## 2020-08-28 DIAGNOSIS — M6281 Muscle weakness (generalized): Secondary | ICD-10-CM | POA: Diagnosis not present

## 2020-09-01 ENCOUNTER — Ambulatory Visit (INDEPENDENT_AMBULATORY_CARE_PROVIDER_SITE_OTHER): Payer: Medicare Other | Admitting: Nurse Practitioner

## 2020-09-01 ENCOUNTER — Encounter (INDEPENDENT_AMBULATORY_CARE_PROVIDER_SITE_OTHER): Payer: Self-pay | Admitting: Nurse Practitioner

## 2020-09-01 ENCOUNTER — Other Ambulatory Visit: Payer: Self-pay

## 2020-09-01 VITALS — BP 116/65 | HR 63 | Resp 16 | Wt 213.0 lb

## 2020-09-01 DIAGNOSIS — I10 Essential (primary) hypertension: Secondary | ICD-10-CM

## 2020-09-01 DIAGNOSIS — L97909 Non-pressure chronic ulcer of unspecified part of unspecified lower leg with unspecified severity: Secondary | ICD-10-CM | POA: Diagnosis not present

## 2020-09-01 DIAGNOSIS — I83009 Varicose veins of unspecified lower extremity with ulcer of unspecified site: Secondary | ICD-10-CM

## 2020-09-01 DIAGNOSIS — J449 Chronic obstructive pulmonary disease, unspecified: Secondary | ICD-10-CM

## 2020-09-02 ENCOUNTER — Ambulatory Visit (INDEPENDENT_AMBULATORY_CARE_PROVIDER_SITE_OTHER): Payer: Medicare Other | Admitting: Nurse Practitioner

## 2020-09-02 ENCOUNTER — Ambulatory Visit: Payer: Medicare Other | Admitting: Internal Medicine

## 2020-09-03 DIAGNOSIS — L97911 Non-pressure chronic ulcer of unspecified part of right lower leg limited to breakdown of skin: Secondary | ICD-10-CM | POA: Diagnosis not present

## 2020-09-03 DIAGNOSIS — I2510 Atherosclerotic heart disease of native coronary artery without angina pectoris: Secondary | ICD-10-CM | POA: Diagnosis not present

## 2020-09-03 DIAGNOSIS — I872 Venous insufficiency (chronic) (peripheral): Secondary | ICD-10-CM | POA: Diagnosis not present

## 2020-09-03 DIAGNOSIS — J449 Chronic obstructive pulmonary disease, unspecified: Secondary | ICD-10-CM | POA: Diagnosis not present

## 2020-09-03 DIAGNOSIS — I4891 Unspecified atrial fibrillation: Secondary | ICD-10-CM | POA: Diagnosis not present

## 2020-09-03 DIAGNOSIS — I110 Hypertensive heart disease with heart failure: Secondary | ICD-10-CM | POA: Diagnosis not present

## 2020-09-03 DIAGNOSIS — I509 Heart failure, unspecified: Secondary | ICD-10-CM | POA: Diagnosis not present

## 2020-09-03 DIAGNOSIS — M6281 Muscle weakness (generalized): Secondary | ICD-10-CM | POA: Diagnosis not present

## 2020-09-03 DIAGNOSIS — L97921 Non-pressure chronic ulcer of unspecified part of left lower leg limited to breakdown of skin: Secondary | ICD-10-CM | POA: Diagnosis not present

## 2020-09-03 DIAGNOSIS — Z87891 Personal history of nicotine dependence: Secondary | ICD-10-CM | POA: Diagnosis not present

## 2020-09-07 ENCOUNTER — Telehealth: Payer: Self-pay

## 2020-09-07 ENCOUNTER — Encounter (INDEPENDENT_AMBULATORY_CARE_PROVIDER_SITE_OTHER): Payer: Self-pay | Admitting: Nurse Practitioner

## 2020-09-07 NOTE — Telephone Encounter (Signed)
Patient called c/o having dizziness on sat and called EMS but everything checked out ok except he had A-fib.  Every few days he is having congestion and ear pain, nausea and diarrhea, chills, had sore throat last week and at times some headaches.  I asked pt if he had been tested for covid and he said no and wasn't going to wait in no lines to be checked.  Pt also has not had covid vaccines.i spoke to Dr Welton Flakes and she asked if pt could come to clinic to be tested but pt stated that it was hard for him to get out of house so Dr Welton Flakes advised that pt needed to call EMS and have them take him to the hospital to be evaluated.

## 2020-09-07 NOTE — Progress Notes (Signed)
Subjective:    Patient ID: Erik [name], male    DOB: Jul 24, 1956, 65 y.o.   MRN: [id number] Chief Complaint  Patient presents with  . Follow-up    Unna boot check    The patient presents today for wrap evaluation. Today the patient swelling is doing much better however the wound persists. He denies any pain or drainage of the wound. He denies any fever, chills, nausea, vomiting or diarrhea. His wound wraps have been done by home health. He has been tolerating them well at this time.   Review of Systems  Musculoskeletal: Positive for back pain.  Skin: Positive for wound.  Neurological: Positive for weakness.  All other systems reviewed and are negative.      Objective:   Physical Exam Vitals reviewed.  HENT:     Head: Normocephalic.  Cardiovascular:     Rate and Rhythm: Normal rate.     Pulses: Normal pulses.  Pulmonary:     Effort: Pulmonary effort is normal.  Skin:    General: Skin is warm and dry.  Neurological:     Mental Status: He is alert and oriented to person, place, and time.  Psychiatric:        Mood and Affect: Mood normal.        Behavior: Behavior normal.        Thought Content: Thought content normal.        Judgment: Judgment normal.     BP 116/65 (BP Location: Left Arm)   Pulse 63   Resp 16   Wt 213 lb (96.6 kg)   BMI 30.56 kg/m   Past Medical History:  Diagnosis Date  . Arthritis   . Asthma   . Atrial fibrillation (HCC)   . CHF (congestive heart failure) (HCC)   . COPD (chronic obstructive pulmonary disease) (HCC)   . Coronary artery disease   . GERD (gastroesophageal reflux disease)   . Hypertension   . Myocardial infarction (HCC)   . Shortness of breath dyspnea     Social History   Socioeconomic History  . Marital status: Married    Spouse name: Not on file  . Number of children: Not on file  . Years of education: Not on file  . Highest education level: Not on file  Occupational History  . Not on file  Tobacco Use  .  Smoking status: Former Smoker    Years: 25.00    Quit date: 03/20/1997    Years since quitting: 23.4  . Smokeless tobacco: Never Used  Vaping Use  . Vaping Use: Never used  Substance and Sexual Activity  . Alcohol use: No  . Drug use: No  . Sexual activity: Not on file  Other Topics Concern  . Not on file  Social History Narrative  . Not on file   Social Determinants of Health   Financial Resource Strain: Not on file  Food Insecurity: Not on file  Transportation Needs: Not on file  Physical Activity: Not on file  Stress: Not on file  Social Connections: Not on file  Intimate Partner Violence: Not on file    Past Surgical History:  Procedure Laterality Date  . ANGIOPLASTY  2009  . CARDIAC CATHETERIZATION N/A 03/18/2015   Procedure: Left Heart Cath with Coronary Angiography;  Surgeon: Lamar Blinks, MD;  Location: [facility] INVASIVE CV LAB;  Service: Cardiovascular;  Laterality: N/A;  . CHOLECYSTECTOMY  2005  . CORONARY ANGIOGRAM  2009    Family History  Problem  Relation Age of Onset  . Coronary artery disease Father   . Diabetes Father   . Hyperlipidemia Father   . Hypertension Father     Allergies  Allergen Reactions  . Benadryl [Diphenhydramine Hcl] Shortness Of Breath  . Morphine And Related     CBC Latest Ref Rng & Units 02/19/2020 08/14/2017 08/13/2017  WBC 3.4 - 10.8 x10E3/uL 8.5 8.3 6.9  Hemoglobin 13.0 - 17.7 g/dL 82.4 23.5 36.1  Hematocrit 37.5 - 51.0 % 45.6 50.3 40.5  Platelets 150 - 450 x10E3/uL 345 329 220      CMP     Component Value Date/Time   NA 138 02/19/2020 0000   NA 135 (L) 07/19/2013 0417   K 4.0 02/19/2020 0000   K 3.9 07/19/2013 0417   CL 100 02/19/2020 0000   CL 101 07/19/2013 0417   CO2 24 02/19/2020 0000   CO2 29 07/19/2013 0417   GLUCOSE 106 (H) 02/19/2020 0000   GLUCOSE 170 (H) 08/14/2017 0527   GLUCOSE 86 07/19/2013 0417   BUN 14 02/19/2020 0000   BUN 10 07/19/2013 0417   CREATININE 0.87 02/19/2020 0000   CREATININE 0.80  07/19/2013 0417   CALCIUM 9.1 02/19/2020 0000   CALCIUM 8.4 (L) 07/19/2013 0417   PROT 6.3 02/19/2020 0000   PROT 6.6 07/19/2013 0417   ALBUMIN 4.2 02/19/2020 0000   ALBUMIN 2.8 (L) 07/19/2013 0417   AST 14 02/19/2020 0000   AST 33 07/19/2013 0417   ALT 12 02/19/2020 0000   ALT 32 07/19/2013 0417   ALKPHOS 81 02/19/2020 0000   ALKPHOS 85 07/19/2013 0417   BILITOT 0.4 02/19/2020 0000   BILITOT 1.6 (H) 07/19/2013 0417   GFRNONAA 91 02/19/2020 0000   GFRNONAA >60 07/19/2013 0417   GFRAA 105 02/19/2020 0000   GFRAA >60 07/19/2013 0417     No results found.     Assessment & Plan:   1. Venous ulcer (HCC) The patient has very good control of his swelling however his ulceration still persists. We will culture it to ensure there is no biofilm. Otherwise the patient will continue to have his Unna wrap changed by home health. Patient will return to the office in 4 weeks.  2. Essential hypertension with goal blood pressure less than 130/80 Continue antihypertensive medications as already ordered, these medications have been reviewed and there are no changes at this time.   3. Chronic obstructive pulmonary disease, unspecified COPD type (HCC) Continue pulmonary medications and aerosols as already ordered, these medications have been reviewed and there are no changes at this time.     Current Outpatient Medications on File Prior to Visit  Medication Sig Dispense Refill  . albuterol (VENTOLIN HFA) 108 (90 Base) MCG/ACT inhaler Inhale into the lungs every 6 (six) hours as needed for wheezing or shortness of breath.    [name] aspirin 325 MG tablet Take 325 mg by mouth daily.    . budesonide-formoterol (SYMBICORT) 160-4.5 MCG/ACT inhaler Inhale 2 puffs into the lungs 2 (two) times daily.    [name] diltiazem (CARDIZEM CD) 180 MG 24 hr capsule Take 1 capsule (180 mg total) by mouth daily. 30 capsule 0  . furosemide (LASIX) 20 MG tablet Take 20 mg by mouth daily.    . Humidifier MISC Use with O2 at  all times 1 each 0  . ipratropium-albuterol (DUONEB) 0.5-2.5 (3) MG/3ML SOLN USE 1 VIAL VIA NEBULIZER EVERY 6 HOURS 360 mL 6  . loratadine (CLARITIN) 10 MG tablet Take 10 mg  by mouth daily.    . meloxicam (MOBIC) 7.5 MG tablet Take 1 tablet (7.5 mg total) by mouth daily. 30 tablet 0  . OXYGEN Inhale into the lungs.    . torsemide (DEMADEX) 10 MG tablet Take 10 mg by mouth daily.    . TRELEGY ELLIPTA 100-62.5-25 MCG/INH AEPB Inhale 1 puff into the lungs daily.     No current facility-administered medications on file prior to visit.    There are no Patient Instructions on file for this visit. No follow-ups on file.   Georgiana Spinner, NP

## 2020-09-08 DIAGNOSIS — I509 Heart failure, unspecified: Secondary | ICD-10-CM | POA: Diagnosis not present

## 2020-09-08 DIAGNOSIS — J449 Chronic obstructive pulmonary disease, unspecified: Secondary | ICD-10-CM | POA: Diagnosis not present

## 2020-09-08 DIAGNOSIS — I110 Hypertensive heart disease with heart failure: Secondary | ICD-10-CM | POA: Diagnosis not present

## 2020-09-08 DIAGNOSIS — M6281 Muscle weakness (generalized): Secondary | ICD-10-CM | POA: Diagnosis not present

## 2020-09-08 DIAGNOSIS — I2510 Atherosclerotic heart disease of native coronary artery without angina pectoris: Secondary | ICD-10-CM | POA: Diagnosis not present

## 2020-09-08 DIAGNOSIS — L97921 Non-pressure chronic ulcer of unspecified part of left lower leg limited to breakdown of skin: Secondary | ICD-10-CM | POA: Diagnosis not present

## 2020-09-08 DIAGNOSIS — Z4800 Encounter for change or removal of nonsurgical wound dressing: Secondary | ICD-10-CM | POA: Diagnosis not present

## 2020-09-08 DIAGNOSIS — I4891 Unspecified atrial fibrillation: Secondary | ICD-10-CM | POA: Diagnosis not present

## 2020-09-08 DIAGNOSIS — I872 Venous insufficiency (chronic) (peripheral): Secondary | ICD-10-CM | POA: Diagnosis not present

## 2020-09-08 DIAGNOSIS — Z87891 Personal history of nicotine dependence: Secondary | ICD-10-CM | POA: Diagnosis not present

## 2020-09-09 DIAGNOSIS — I4891 Unspecified atrial fibrillation: Secondary | ICD-10-CM | POA: Diagnosis not present

## 2020-09-09 DIAGNOSIS — I872 Venous insufficiency (chronic) (peripheral): Secondary | ICD-10-CM | POA: Diagnosis not present

## 2020-09-09 DIAGNOSIS — J449 Chronic obstructive pulmonary disease, unspecified: Secondary | ICD-10-CM | POA: Diagnosis not present

## 2020-09-09 DIAGNOSIS — Z4800 Encounter for change or removal of nonsurgical wound dressing: Secondary | ICD-10-CM | POA: Diagnosis not present

## 2020-09-09 DIAGNOSIS — L97921 Non-pressure chronic ulcer of unspecified part of left lower leg limited to breakdown of skin: Secondary | ICD-10-CM | POA: Diagnosis not present

## 2020-09-09 DIAGNOSIS — Z87891 Personal history of nicotine dependence: Secondary | ICD-10-CM | POA: Diagnosis not present

## 2020-09-09 DIAGNOSIS — M6281 Muscle weakness (generalized): Secondary | ICD-10-CM | POA: Diagnosis not present

## 2020-09-09 DIAGNOSIS — I509 Heart failure, unspecified: Secondary | ICD-10-CM | POA: Diagnosis not present

## 2020-09-09 DIAGNOSIS — I2510 Atherosclerotic heart disease of native coronary artery without angina pectoris: Secondary | ICD-10-CM | POA: Diagnosis not present

## 2020-09-09 DIAGNOSIS — I110 Hypertensive heart disease with heart failure: Secondary | ICD-10-CM | POA: Diagnosis not present

## 2020-09-10 DIAGNOSIS — I2510 Atherosclerotic heart disease of native coronary artery without angina pectoris: Secondary | ICD-10-CM | POA: Diagnosis not present

## 2020-09-10 DIAGNOSIS — M6281 Muscle weakness (generalized): Secondary | ICD-10-CM | POA: Diagnosis not present

## 2020-09-10 DIAGNOSIS — I509 Heart failure, unspecified: Secondary | ICD-10-CM | POA: Diagnosis not present

## 2020-09-10 DIAGNOSIS — Z4800 Encounter for change or removal of nonsurgical wound dressing: Secondary | ICD-10-CM | POA: Diagnosis not present

## 2020-09-10 DIAGNOSIS — I872 Venous insufficiency (chronic) (peripheral): Secondary | ICD-10-CM | POA: Diagnosis not present

## 2020-09-10 DIAGNOSIS — I110 Hypertensive heart disease with heart failure: Secondary | ICD-10-CM | POA: Diagnosis not present

## 2020-09-10 DIAGNOSIS — L97921 Non-pressure chronic ulcer of unspecified part of left lower leg limited to breakdown of skin: Secondary | ICD-10-CM | POA: Diagnosis not present

## 2020-09-10 DIAGNOSIS — Z87891 Personal history of nicotine dependence: Secondary | ICD-10-CM | POA: Diagnosis not present

## 2020-09-10 DIAGNOSIS — I4891 Unspecified atrial fibrillation: Secondary | ICD-10-CM | POA: Diagnosis not present

## 2020-09-10 DIAGNOSIS — J449 Chronic obstructive pulmonary disease, unspecified: Secondary | ICD-10-CM | POA: Diagnosis not present

## 2020-09-14 ENCOUNTER — Ambulatory Visit: Payer: Medicare Other | Admitting: Internal Medicine

## 2020-09-15 ENCOUNTER — Ambulatory Visit: Payer: Medicare Other | Admitting: Internal Medicine

## 2020-09-15 ENCOUNTER — Encounter: Payer: Self-pay | Admitting: Internal Medicine

## 2020-09-15 VITALS — BP 134/72 | HR 84 | Temp 98.4°F | Resp 16 | Ht 70.0 in | Wt 226.4 lb

## 2020-09-15 DIAGNOSIS — J449 Chronic obstructive pulmonary disease, unspecified: Secondary | ICD-10-CM

## 2020-09-15 DIAGNOSIS — R0902 Hypoxemia: Secondary | ICD-10-CM

## 2020-09-15 DIAGNOSIS — R0602 Shortness of breath: Secondary | ICD-10-CM

## 2020-09-15 DIAGNOSIS — Z7722 Contact with and (suspected) exposure to environmental tobacco smoke (acute) (chronic): Secondary | ICD-10-CM | POA: Diagnosis not present

## 2020-09-15 DIAGNOSIS — Z9981 Dependence on supplemental oxygen: Secondary | ICD-10-CM

## 2020-09-15 DIAGNOSIS — J9611 Chronic respiratory failure with hypoxia: Secondary | ICD-10-CM | POA: Diagnosis not present

## 2020-09-15 MED ORDER — IPRATROPIUM-ALBUTEROL 0.5-2.5 (3) MG/3ML IN SOLN: RESPIRATORY_TRACT | 6 refills | Status: DC

## 2020-09-15 NOTE — Progress Notes (Signed)
[facility] [address] [place], [place] [address]  Pulmonary Sleep Medicine   Office Visit Note  Patient Name: Erik [name] DOB: 06-Oct-1955 MRN [id number]  Date of Service: 09/23/2020  Complaints/HPI:   Patient has longstanding history of COPD has had some issues with increased shortness of breath.  Patient done does have cough and some congestion.  Has been prescribed albuterol as well as Symbicort.  In addition to that patient does have some cardiac issues is on chronic diuresis for fluid overload.  Patient also does have history of chronic atrial fibrillation.  Multifactorial in terms of the cause of the shortness of breath.  Patient does admit to having ongoing cough which currently is nonproductive along with the shortness or breath that is noted  ROS  General: (-) fever, (-) chills, (-) night sweats, (-) weakness Skin: (-) rashes, (-) itching,. Eyes: (-) visual changes, (-) redness, (-) itching. Nose and Sinuses: (-) nasal stuffiness or itchiness, (-) postnasal drip, (-) nosebleeds, (-) sinus trouble. Mouth and Throat: (-) sore throat, (-) hoarseness. Neck: (-) swollen glands, (-) enlarged thyroid, (-) neck pain. Respiratory: + cough, (-) bloody sputum, + shortness of breath, - wheezing. Cardiovascular: - ankle swelling, (-) chest pain. Lymphatic: (-) lymph node enlargement. Neurologic: (-) numbness, (-) tingling. Psychiatric: (-) anxiety, (-) depression   Current Medication: Outpatient Encounter Medications as of 09/15/2020  Medication Sig  . albuterol (VENTOLIN HFA) 108 (90 Base) MCG/ACT inhaler Inhale into the lungs every 6 (six) hours as needed for wheezing or shortness of breath.  [name] aspirin 325 MG tablet Take 325 mg by mouth daily.  . budesonide-formoterol (SYMBICORT) 160-4.5 MCG/ACT inhaler Inhale 2 puffs into the lungs 2 (two) times daily.  [name] diltiazem (CARDIZEM CD) 180 MG 24 hr capsule Take 1 capsule (180 mg total) by mouth daily.  . furosemide  (LASIX) 20 MG tablet Take 20 mg by mouth daily.  . Humidifier MISC Use with O2 at all times  . loratadine (CLARITIN) 10 MG tablet Take 10 mg by mouth daily.  . meloxicam (MOBIC) 7.5 MG tablet Take 1 tablet (7.5 mg total) by mouth daily.  . OXYGEN Inhale into the lungs.  . torsemide (DEMADEX) 10 MG tablet Take 10 mg by mouth daily.  . TRELEGY ELLIPTA 100-62.5-25 MCG/INH AEPB Inhale 1 puff into the lungs daily.  . [DISCONTINUED] ipratropium-albuterol (DUONEB) 0.5-2.5 (3) MG/3ML SOLN USE 1 VIAL VIA NEBULIZER EVERY 6 HOURS  . ipratropium-albuterol (DUONEB) 0.5-2.5 (3) MG/3ML SOLN USE 1 VIAL VIA NEBULIZER EVERY 6 HOURS   No facility-administered encounter medications on file as of 09/15/2020.    Surgical History: Past Surgical History:  Procedure Laterality Date  . ANGIOPLASTY  2009  . CARDIAC CATHETERIZATION N/A 03/18/2015   Procedure: Left Heart Cath with Coronary Angiography;  Surgeon: Lamar Blinks, MD;  Location: [facility] INVASIVE CV LAB;  Service: Cardiovascular;  Laterality: N/A;  . CHOLECYSTECTOMY  2005  . CORONARY ANGIOGRAM  2009    Medical History: Past Medical History:  Diagnosis Date  . Arthritis   . Asthma   . Atrial fibrillation (HCC)   . CHF (congestive heart failure) (HCC)   . COPD (chronic obstructive pulmonary disease) (HCC)   . Coronary artery disease   . GERD (gastroesophageal reflux disease)   . Hypertension   . Myocardial infarction (HCC)   . Shortness of breath dyspnea     Family History: Family History  Problem Relation Age of Onset  . Coronary artery disease Father   . Diabetes  Father   . Hyperlipidemia Father   . Hypertension Father     Social History: Social History   Socioeconomic History  . Marital status: Married    Spouse name: Not on file  . Number of children: Not on file  . Years of education: Not on file  . Highest education level: Not on file  Occupational History  . Not on file  Tobacco Use  . Smoking status: Former Smoker     Years: 25.00    Quit date: 03/20/1997    Years since quitting: 23.5  . Smokeless tobacco: Never Used  Vaping Use  . Vaping Use: Never used  Substance and Sexual Activity  . Alcohol use: No  . Drug use: No  . Sexual activity: Not on file  Other Topics Concern  . Not on file  Social History Narrative  . Not on file   Social Determinants of Health   Financial Resource Strain: Not on file  Food Insecurity: Not on file  Transportation Needs: Not on file  Physical Activity: Not on file  Stress: Not on file  Social Connections: Not on file  Intimate Partner Violence: Not on file    Vital Signs: Blood pressure 134/72, pulse 84, temperature 98.4 F (36.9 C), resp. rate 16, height 5\' 10"  (1.778 m), weight 226 lb 6.4 oz (102.7 kg), SpO2 98 %.  Examination: General Appearance: The patient is well-developed, well-nourished, and in no distress. Skin: Gross inspection of skin unremarkable. Head: normocephalic, no gross deformities. Eyes: no gross deformities noted. ENT: ears appear grossly normal no exudates. Neck: Supple. No thyromegaly. No LAD. Respiratory: few rhonchi noted. Cardiovascular: Normal S1 and S2 without murmur or rub. Extremities: No cyanosis. pulses are equal. Neurologic: Alert and oriented. No involuntary movements.  LABS: No results found for this or any previous visit (from the past 2160 hour(s)).  Radiology: DG Chest 2 View  Result Date: 12/11/2019 CLINICAL DATA:  Nodule surveillance EXAM: CHEST - 2 VIEW COMPARISON:  10/09/2019, 07/01/2019 FINDINGS: Hyperinflation with emphysematous disease. No acute consolidation or effusion. Mild bronchitic changes. The previously noted right mid lung nodular opacity is not clearly identified on today's study. Stable cardiomediastinal silhouette with aortic atherosclerosis. No pneumothorax. IMPRESSION: 1. The previously noted small nodular opacity is not clearly identified today 2. Emphysema with bronchitic changes Electronically  Signed   By: 07/03/2019 M.D.   On: 12/11/2019 23:26    No results found.  No results found.    Assessment and Plan: Patient Active Problem List   Diagnosis Date Noted  . Atherosclerosis of native arteries of extremity with intermittent claudication (HCC) 06/01/2020  . Chronic venous insufficiency 06/01/2020  . Dorsalgia 09/11/2017  . Occlusion and stenosis of bilateral carotid arteries 09/11/2017  . Pulmonic heart disease (HCC) 09/11/2017  . COPD (chronic obstructive pulmonary disease) (HCC) 08/14/2017  . Chronic a-fib (HCC) 03/27/2015  . CAD (coronary artery disease) 03/02/2015  . 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  . Essential hypertension with goal blood pressure less than 130/80 02/23/2015     1. COPD with hypoxia (HCC)  a he may benefit from addition of nebulizer as sort of rescue when he is not able to do the MDI properly. - For home use only DME Nebulizer machine - ipratropium-albuterol (DUONEB) 0.5-2.5 (3) MG/3ML SOLN; USE 1 VIAL VIA NEBULIZER EVERY 6 HOURS  Dispense: 360 mL; Refill: 6  2. SOB (shortness of breath)   Secondary to severe COPD plan is going to be to continue with the current management -  Spirometry with Graph  3. Obesity, morbid (HCC) Obesity Counseling: Had a lengthy discussion regarding patients BMI and weight issues. Patient was instructed on portion control as well as increased activity. Also discussed caloric restrictions with trying to maintain intake less than 2000 Kcal. Discussions were made in accordance with the 5As of weight management. Simple actions such as not eating late and if able to, taking a walk is suggested.   4. Chronic respiratory failure with hypoxia (HCC)  secondary to COPD.  Saturations are good right now will continue with the supplemental oxygen gave a very strong suggestion that patient be extremely compliant with the oxygen therapy in addition to that avoid cigarette smoke even secondary smoke.  5. Supplemental oxygen  dependent   As above strongly recommended to maintain oxygen with the supplemental oxygen  6. Second hand smoke exposure  avoid secondhand smoke at [place] patient should have family members go outside and smoke rather than smoking inside the house sold   General Counseling: I have discussed the findings of the evaluation and examination with Arliss.  I have also discussed any further diagnostic evaluation thatmay be needed or ordered today. Cataldo verbalizes understanding of the findings of todays visit. We also reviewed his medications today and discussed drug interactions and side effects including but not limited excessive drowsiness and altered mental states. We also discussed that there is always a risk not just to him but also people around him. he has been encouraged to call the office with any questions or concerns that should arise related to todays visit.  Orders Placed This Encounter  Procedures  . For home use only DME Nebulizer machine    Order Specific Question:   Patient needs a nebulizer to treat with the following condition    Answer:   COPD (chronic obstructive pulmonary disease) (HCC) [024097]    Order Specific Question:   Length of Need    Answer:   Lifetime  . Spirometry with Graph    Order Specific Question:   Where should this test be performed?    Answer:   [facility]    Order Specific Question:   Basic spirometry    Answer:   Yes    Order Specific Question:   Spirometry pre & post bronchodilator    Answer:   No     Time spent: 16  I have personally obtained a history, examined the patient, evaluated laboratory and imaging results, formulated the assessment and plan and placed orders.    Yevonne Pax, MD [facility] Pulmonary and Critical Care Sleep medicine

## 2020-09-16 DIAGNOSIS — I872 Venous insufficiency (chronic) (peripheral): Secondary | ICD-10-CM | POA: Diagnosis not present

## 2020-09-16 DIAGNOSIS — J449 Chronic obstructive pulmonary disease, unspecified: Secondary | ICD-10-CM | POA: Diagnosis not present

## 2020-09-16 DIAGNOSIS — I4891 Unspecified atrial fibrillation: Secondary | ICD-10-CM | POA: Diagnosis not present

## 2020-09-16 DIAGNOSIS — Z4800 Encounter for change or removal of nonsurgical wound dressing: Secondary | ICD-10-CM | POA: Diagnosis not present

## 2020-09-16 DIAGNOSIS — I110 Hypertensive heart disease with heart failure: Secondary | ICD-10-CM | POA: Diagnosis not present

## 2020-09-16 DIAGNOSIS — L97921 Non-pressure chronic ulcer of unspecified part of left lower leg limited to breakdown of skin: Secondary | ICD-10-CM | POA: Diagnosis not present

## 2020-09-16 DIAGNOSIS — I509 Heart failure, unspecified: Secondary | ICD-10-CM | POA: Diagnosis not present

## 2020-09-16 DIAGNOSIS — Z87891 Personal history of nicotine dependence: Secondary | ICD-10-CM | POA: Diagnosis not present

## 2020-09-16 DIAGNOSIS — I2510 Atherosclerotic heart disease of native coronary artery without angina pectoris: Secondary | ICD-10-CM | POA: Diagnosis not present

## 2020-09-16 DIAGNOSIS — M6281 Muscle weakness (generalized): Secondary | ICD-10-CM | POA: Diagnosis not present

## 2020-09-17 DIAGNOSIS — R0902 Hypoxemia: Secondary | ICD-10-CM | POA: Diagnosis not present

## 2020-09-17 DIAGNOSIS — J449 Chronic obstructive pulmonary disease, unspecified: Secondary | ICD-10-CM | POA: Diagnosis not present

## 2020-09-18 DIAGNOSIS — J449 Chronic obstructive pulmonary disease, unspecified: Secondary | ICD-10-CM | POA: Diagnosis not present

## 2020-09-18 DIAGNOSIS — J439 Emphysema, unspecified: Secondary | ICD-10-CM | POA: Diagnosis not present

## 2020-09-21 DIAGNOSIS — I2510 Atherosclerotic heart disease of native coronary artery without angina pectoris: Secondary | ICD-10-CM | POA: Diagnosis not present

## 2020-09-21 DIAGNOSIS — L97921 Non-pressure chronic ulcer of unspecified part of left lower leg limited to breakdown of skin: Secondary | ICD-10-CM | POA: Diagnosis not present

## 2020-09-21 DIAGNOSIS — I4891 Unspecified atrial fibrillation: Secondary | ICD-10-CM | POA: Diagnosis not present

## 2020-09-21 DIAGNOSIS — Z4800 Encounter for change or removal of nonsurgical wound dressing: Secondary | ICD-10-CM | POA: Diagnosis not present

## 2020-09-21 DIAGNOSIS — I872 Venous insufficiency (chronic) (peripheral): Secondary | ICD-10-CM | POA: Diagnosis not present

## 2020-09-21 DIAGNOSIS — J449 Chronic obstructive pulmonary disease, unspecified: Secondary | ICD-10-CM | POA: Diagnosis not present

## 2020-09-21 DIAGNOSIS — I110 Hypertensive heart disease with heart failure: Secondary | ICD-10-CM | POA: Diagnosis not present

## 2020-09-21 DIAGNOSIS — I509 Heart failure, unspecified: Secondary | ICD-10-CM | POA: Diagnosis not present

## 2020-09-21 DIAGNOSIS — Z87891 Personal history of nicotine dependence: Secondary | ICD-10-CM | POA: Diagnosis not present

## 2020-09-21 DIAGNOSIS — M6281 Muscle weakness (generalized): Secondary | ICD-10-CM | POA: Diagnosis not present

## 2020-09-25 ENCOUNTER — Other Ambulatory Visit: Payer: Self-pay | Admitting: Hospice and Palliative Medicine

## 2020-09-25 ENCOUNTER — Telehealth: Payer: Self-pay

## 2020-09-25 ENCOUNTER — Other Ambulatory Visit: Payer: Self-pay

## 2020-09-25 MED ORDER — AZITHROMYCIN 250 MG PO TABS: ORAL_TABLET | ORAL | 0 refills | Status: DC

## 2020-09-25 MED ORDER — TRELEGY ELLIPTA 100-62.5-25 MCG/INH IN AEPB: 1.0000 | INHALATION_SPRAY | Freq: Every day | RESPIRATORY_TRACT | 3 refills | Status: DC

## 2020-09-25 MED ORDER — DALIRESP 250 MCG PO TABS: 250.0000 ug | ORAL_TABLET | Freq: Every day | ORAL | 1 refills | Status: DC

## 2020-09-25 NOTE — Telephone Encounter (Signed)
Pt called that coughing congestion and sob as per taylor send zirthromax 250 mg for 14 days and also send daliresp and he can take mucinex for cough

## 2020-09-28 ENCOUNTER — Ambulatory Visit (INDEPENDENT_AMBULATORY_CARE_PROVIDER_SITE_OTHER): Payer: Medicare Other | Admitting: Nurse Practitioner

## 2020-09-28 ENCOUNTER — Telehealth: Payer: Self-pay

## 2020-09-28 ENCOUNTER — Ambulatory Visit: Payer: Medicare Other | Admitting: Internal Medicine

## 2020-09-28 ENCOUNTER — Other Ambulatory Visit: Payer: Self-pay

## 2020-09-28 NOTE — Telephone Encounter (Signed)
Patient has been advised to see ER due to him on 3L and oxygen dropping into low 80's its Erik [name] for him to go get evaluated, his heart rate fluctuating and may need x rays and EKG and a complete workup, per Dr Welton Flakes pt has been advised to see hospital. Erik [name]

## 2020-09-28 NOTE — Telephone Encounter (Signed)
Pt called that having side effects zithromax blurry vision and  Having irregular heart rate a advised him to stopped antibiotic also pt need to seen in person and advised him to call cardiology for heart

## 2020-09-29 ENCOUNTER — Ambulatory Visit: Payer: Medicare Other | Admitting: Cardiovascular Disease

## 2020-09-29 ENCOUNTER — Encounter: Payer: Self-pay | Admitting: Cardiovascular Disease

## 2020-09-29 VITALS — BP 129/73 | HR 111 | Ht 70.0 in | Wt 224.2 lb

## 2020-09-29 DIAGNOSIS — I482 Chronic atrial fibrillation, unspecified: Secondary | ICD-10-CM

## 2020-09-29 DIAGNOSIS — I25118 Atherosclerotic heart disease of native coronary artery with other forms of angina pectoris: Secondary | ICD-10-CM

## 2020-09-29 DIAGNOSIS — I5022 Chronic systolic (congestive) heart failure: Secondary | ICD-10-CM | POA: Diagnosis not present

## 2020-09-29 MED ORDER — APIXABAN 5 MG PO TABS: 5.0000 mg | ORAL_TABLET | Freq: Two times a day (BID) | ORAL | 5 refills | Status: DC

## 2020-09-29 MED ORDER — METOPROLOL TARTRATE 25 MG PO TABS: 25.0000 mg | ORAL_TABLET | Freq: Two times a day (BID) | ORAL | 1 refills | Status: DC

## 2020-09-29 NOTE — Progress Notes (Signed)
Cardiology Office Note   Date:  09/29/2020   ID:  Erik [name], DOB Mar 09, 1956, MRN [id number]  PCP:  Erik Code, MD  Cardiologist:   Erik Bears, MD   Chief Complaint  Patient presents with  . New Patient (Initial Visit)    Self ref for chest pain, history of A-fib, dizziness shortness of breath, LE edema and pain in legs with walking. Medications reviewed by the patient verbally.       History of Present Illness: Erik [name] is a 65 y.o. male who was evaluated by Dr. Welton [name] for evaluation management of atrial fibrillation chronic systolic heart failure. He has known history of coronary artery disease status post bare-metal stent placement to OM 2 in 2009.  Most recent cardiac catheterization in 2016 showed patent OM 2 stent with mild in-stent restenosis, chronically occluded proximal right coronary artery with good collaterals, moderate mid LAD stenosis and mild left circumflex disease.  He has known history of chronic systolic heart failure, chronic atrial fibrillation not on anticoagulation, COPD He was hospitalized in 2014 with mild acute pancreatitis.  It appears that anticoagulation with Pradaxa was discontinued at that time due to compliance issue according to documentation.  The patient did not have a bleeding event.   He is a previous smoker and quit in 1997 but does have known history of COPD and currently on home oxygen 2 to 3 L.  He has been on home oxygen since 2012.  He reports having an upper respiratory tract infection in December and overall had severe symptoms but did not require hospitalization.  He does not think it was Covid but I do not think he was tested.  Since that time, he noticed significant worsening of exertional dyspnea as well as intermittent tachycardia with heart rate going as fast as 170 bpm. There has been no weight gain although he does have significant bilateral leg edema.  He has occasional chest discomfort when his heart rate is  fast.   Past Medical History:  Diagnosis Date  . Arthritis   . Asthma   . Atrial fibrillation (HCC)   . CHF (congestive heart failure) (HCC)   . COPD (chronic obstructive pulmonary disease) (HCC)   . Coronary artery disease   . GERD (gastroesophageal reflux disease)   . Hypertension   . Myocardial infarction (HCC)   . Shortness of breath dyspnea     Past Surgical History:  Procedure Laterality Date  . ANGIOPLASTY  2009  . CARDIAC CATHETERIZATION N/A 03/18/2015   Procedure: Left Heart Cath with Coronary Angiography;  Surgeon: Lamar Blinks, MD;  Location: [facility] INVASIVE CV LAB;  Service: Cardiovascular;  Laterality: N/A;  . CHOLECYSTECTOMY  2005  . CORONARY ANGIOGRAM  2009     Current Outpatient Medications  Medication Sig Dispense Refill  . aspirin 325 MG tablet Take 325 mg by mouth daily.    [name] diltiazem (CARDIZEM CD) 180 MG 24 hr capsule Take 1 capsule (180 mg total) by mouth daily. 30 capsule 0  . Humidifier MISC Use with O2 at all times 1 each 0  . ipratropium-albuterol (DUONEB) 0.5-2.5 (3) MG/3ML SOLN USE 1 VIAL VIA NEBULIZER EVERY 6 HOURS 360 mL 6  . loratadine (CLARITIN) 10 MG tablet Take 10 mg by mouth daily.    . OXYGEN Inhale 2-3 L into the lungs daily.    [name] torsemide (DEMADEX) 10 MG tablet Take 10 mg by mouth daily.    . TRELEGY ELLIPTA 100-62.5-25 MCG/INH AEPB  Inhale 1 puff into the lungs daily. 1 each 3   No current facility-administered medications for this visit.    Allergies:   Benadryl [diphenhydramine hcl] and Morphine and related    Social History:  The patient  reports that he quit smoking about 23 years ago. He quit after 25.00 years of use. He has never used smokeless tobacco. He reports that he does not drink alcohol and does not use drugs.   Family History:  The patient's family history includes Coronary artery disease in his father; Diabetes in his father; Hyperlipidemia in his father and mother; Hypertension in his father; Stroke in his mother.     ROS:  Please see the history of present illness.   Otherwise, review of systems are positive for none.   All other systems are reviewed and negative.    PHYSICAL EXAM: VS:  BP 129/73 (BP Location: Right Arm, Patient Position: Sitting, Cuff Size: Normal)   Pulse (!) 111   Ht 5\' 10"  (1.778 m)   Wt 224 lb 4 oz (101.7 kg)   SpO2 96% Comment: 3 liters of oxygen  BMI 32.18 kg/m  , BMI Body mass index is 32.18 kg/m. GEN: Well nourished, well developed, in no acute distress  HEENT: normal  Neck: no JVD, carotid bruits, or masses Cardiac: Irregularly irregular and tachycardic; no murmurs, rubs, or gallops, mild edema  Respiratory: Diminished breath sounds bilaterally. GI: soft, nontender, nondistended, + BS MS: no deformity or atrophy  Skin: warm and dry, no rash Neuro:  Strength and sensation are intact Psych: euthymic mood, full affect   EKG:  EKG is ordered today. The ekg ordered today demonstrates atrial fibrillation with RVR and possible old anteroseptal infarct.   Recent Labs: 02/19/2020: ALT 12; BUN 14; Creatinine, Ser 0.87; Hemoglobin 15.1; Platelets 345; Potassium 4.0; Sodium 138; TSH 1.380    Lipid Panel    Component Value Date/Time   CHOL 184 02/19/2020 0000   CHOL 146 07/18/2013 0934   TRIG 110 02/19/2020 0000   TRIG 53 07/18/2013 0934   HDL 49 02/19/2020 0000   HDL 48 07/18/2013 0934   VLDL 11 07/18/2013 0934   LDLCALC 115 (H) 02/19/2020 0000   LDLCALC 87 07/18/2013 0934      Wt Readings from Last 3 Encounters:  09/29/20 224 lb 4 oz (101.7 kg)  09/15/20 226 lb 6.4 oz (102.7 kg)  09/01/20 213 lb (96.6 kg)       PAD Screen 09/29/2020  Previous PAD dx? No  Previous surgical procedure? No  Pain with walking? Yes  Feet/toe relief with dangling? No  Painful, non-healing ulcers? Yes  Extremities discolored? Yes      ASSESSMENT AND PLAN:  1.  Chronic systolic heart failure: He has history of mildly reduced ejection fraction.  He reports significant  worsening of symptoms since December after he had what seems to be a pulmonary infection.  We have to evaluate his ejection fraction and optimize his medications.  I requested an echocardiogram. He does not seem to be on optimal medications for heart failure.  Continue same dose torsemide for now 10 mg daily.  I elected to add metoprolol 25 mg twice daily with plans to switch him to Toprol-XL upon follow-up. If there is a change in his LV systolic function, he will likely require a right and left cardiac catheterization.  2.  Chronic atrial fibrillation: Ventricular rate is not controlled.  I elected to add metoprolol as outlined above.  If his EF is  low, diltiazem is not a good option and we will have to transition him to a beta-blocker. Chads vas score is 4.  I discussed the importance of long-term anticoagulation.  I elected to start Eliquis 5 mg twice daily.  3.  Coronary artery disease: Significant worsening of dyspnea, the patient will require further cardiac evaluation upon follow-up which will be determined based on his echocardiogram results.    Disposition:   FU with me in 1 month  Signed,  Erik Bears, MD  09/29/2020 2:13 PM     [facility]

## 2020-09-29 NOTE — Patient Instructions (Signed)
Medication Instructions:  Your physician has recommended you make the following change in your medication:   1) STOP Aspirin  2) START Eliquis 5 mg twice daily. An Rx has been sent to your pharmacy.  3) START Metoprolol 25 mg twice daily. An Rx has been sent to your pharmacy  *If you need a refill on your cardiac medications before your next appointment, please call your pharmacy*   Lab Work: None ordered If you have labs (blood work) drawn today and your tests are completely normal, you will receive your results only by: [name] MyChart Message (if you have MyChart) OR . A paper copy in the mail If you have any lab test that is abnormal or we need to change your treatment, we will call you to review the results.   Testing/Procedures: Your physician has requested that you have an echocardiogram. Echocardiography is a painless test that uses sound waves to create images of your heart. It provides your doctor with information about the size and shape of your heart and how well your heart's chambers and valves are working. This procedure takes approximately one hour. There are no restrictions for this procedure.     Follow-Up: At [facility], you and your health needs are our priority.  As part of our continuing mission to provide you with exceptional heart care, we have created designated Provider Care Teams.  These Care Teams include your primary Cardiologist (physician) and Advanced Practice Providers (APPs -  Physician Assistants and Nurse Practitioners) who all work together to provide you with the care you need, when you need it.  We recommend signing up for the patient portal called "MyChart".  Sign up information is provided on this After Visit Summary.  MyChart is used to connect with patients for Virtual Visits (Telemedicine).  Patients are able to view lab/test results, encounter notes, upcoming appointments, etc.  Non-urgent messages can be sent to your provider as well.   To  learn more about what you can do with MyChart, go to [email or web address].    Your next appointment:   4 week(s)  The format for your next appointment:   In Person  Provider:   You may see Lorine Bears, MD or one of the following Advanced Practice Providers on your designated Care Team:    Nicolasa Ducking, NP  Eula Listen, PA-C  Marisue Ivan, PA-C  Cadence [place], [place]  Gillian Shields, NP    Other Instructions N/A

## 2020-09-30 ENCOUNTER — Other Ambulatory Visit: Payer: Self-pay

## 2020-09-30 ENCOUNTER — Ambulatory Visit (INDEPENDENT_AMBULATORY_CARE_PROVIDER_SITE_OTHER): Payer: Medicare Other

## 2020-09-30 DIAGNOSIS — I5022 Chronic systolic (congestive) heart failure: Secondary | ICD-10-CM

## 2020-09-30 LAB — ECHOCARDIOGRAM COMPLETE
AR max vel: 1.47 cm2
AV Area VTI: 1.73 cm2
AV Area mean vel: 1.56 cm2
AV Mean grad: 5.5 mmHg
AV Peak grad: 12.2 mmHg
Ao pk vel: 1.75 m/s
Area-P 1/2: 3.58 cm2

## 2020-10-01 ENCOUNTER — Other Ambulatory Visit: Payer: Self-pay

## 2020-10-01 ENCOUNTER — Telehealth: Payer: Self-pay | Admitting: Cardiovascular Disease

## 2020-10-01 ENCOUNTER — Telehealth: Payer: Self-pay

## 2020-10-01 DIAGNOSIS — I5022 Chronic systolic (congestive) heart failure: Secondary | ICD-10-CM | POA: Diagnosis not present

## 2020-10-01 DIAGNOSIS — I509 Heart failure, unspecified: Secondary | ICD-10-CM | POA: Diagnosis not present

## 2020-10-01 DIAGNOSIS — Z87891 Personal history of nicotine dependence: Secondary | ICD-10-CM | POA: Diagnosis not present

## 2020-10-01 DIAGNOSIS — I4891 Unspecified atrial fibrillation: Secondary | ICD-10-CM | POA: Diagnosis not present

## 2020-10-01 DIAGNOSIS — J449 Chronic obstructive pulmonary disease, unspecified: Secondary | ICD-10-CM | POA: Diagnosis not present

## 2020-10-01 DIAGNOSIS — Z4800 Encounter for change or removal of nonsurgical wound dressing: Secondary | ICD-10-CM | POA: Diagnosis not present

## 2020-10-01 DIAGNOSIS — M6281 Muscle weakness (generalized): Secondary | ICD-10-CM | POA: Diagnosis not present

## 2020-10-01 DIAGNOSIS — L97921 Non-pressure chronic ulcer of unspecified part of left lower leg limited to breakdown of skin: Secondary | ICD-10-CM | POA: Diagnosis not present

## 2020-10-01 DIAGNOSIS — I2510 Atherosclerotic heart disease of native coronary artery without angina pectoris: Secondary | ICD-10-CM | POA: Diagnosis not present

## 2020-10-01 DIAGNOSIS — I110 Hypertensive heart disease with heart failure: Secondary | ICD-10-CM | POA: Diagnosis not present

## 2020-10-01 DIAGNOSIS — I872 Venous insufficiency (chronic) (peripheral): Secondary | ICD-10-CM | POA: Diagnosis not present

## 2020-10-01 MED ORDER — DALIRESP 250 MCG PO TABS: ORAL_TABLET | ORAL | 3 refills | Status: DC

## 2020-10-01 MED ORDER — PERFLUTREN LIPID MICROSPHERE
1.0000 mL | INTRAVENOUS | Status: AC | PRN
  Administered 2020-10-01: 2 mL via INTRAVENOUS

## 2020-10-01 NOTE — Telephone Encounter (Signed)
PA approved for DALIRESP 250 mcg tabs on 10/01/20 and valid thru 08/07/2021

## 2020-10-01 NOTE — Telephone Encounter (Signed)
Patient c/o Palpitations:  High priority if patient c/o lightheadedness, shortness of breath, or chest pain  1) How long have you had palpitations/irregular HR/ Afib? Are you having the symptoms now? Few days (recently started metoprolol and eliquis)  2) Are you currently experiencing lightheadedness, SOB or CP? Sob, lightheaded off and on  3) Do you have a history of afib (atrial fibrillation) or irregular heart rhythm? Yes, a fib  4) Have you checked your BP or HR? (document readings if available):   2/23 59 then to 39 then jumped to 125  5) Are you experiencing any other symptoms?

## 2020-10-01 NOTE — Telephone Encounter (Addendum)
Spoke with the patient. Patient sts that is currently doing ok. Patient  sts that he had an episode yesterday of dizziness. He is on continuous oxygen 3 L. He reports drops in his O2 sat with ambulation. Adv him to discuss his O2 with his pulmonologist Dr. Park Breed. He was having fluctuation in  his HR. He was using a pulse ox. Patient sts that he put new batteries in his pulse ox and thinks it is accurate.  He is taking his metoprolol and diltiazem. Patient checked his BP and HR while I held the line. 119/84 88 bpm.  Adv the patient to continue monitor his symptoms. He is to check his BP and HR when he has symptoms of dizziness or lightheadiness. He is to contact the office if his BP/HR running low.

## 2020-10-02 ENCOUNTER — Telehealth (INDEPENDENT_AMBULATORY_CARE_PROVIDER_SITE_OTHER): Payer: Self-pay

## 2020-10-02 NOTE — Telephone Encounter (Signed)
Agree. thanks

## 2020-10-02 NOTE — Telephone Encounter (Signed)
Safari from [facility] left a voicemail requesting to use calcium alginate with silver for left leg drainage. I spoke with Sheppard Plumber NP and is she fine with requesting orders. Home health nurse has been made aware with approved orders.

## 2020-10-06 ENCOUNTER — Other Ambulatory Visit: Payer: Self-pay

## 2020-10-06 ENCOUNTER — Ambulatory Visit (INDEPENDENT_AMBULATORY_CARE_PROVIDER_SITE_OTHER): Payer: Medicare Other | Admitting: Nurse Practitioner

## 2020-10-06 ENCOUNTER — Encounter (INDEPENDENT_AMBULATORY_CARE_PROVIDER_SITE_OTHER): Payer: Self-pay | Admitting: Nurse Practitioner

## 2020-10-06 ENCOUNTER — Encounter: Payer: Self-pay | Admitting: Hospice and Palliative Medicine

## 2020-10-06 ENCOUNTER — Ambulatory Visit (INDEPENDENT_AMBULATORY_CARE_PROVIDER_SITE_OTHER): Payer: Medicare Other | Admitting: Hospice and Palliative Medicine

## 2020-10-06 VITALS — BP 125/76 | HR 84 | Resp 16

## 2020-10-06 VITALS — BP 128/68 | HR 80 | Temp 97.4°F | Resp 16 | Ht 70.0 in | Wt 226.0 lb

## 2020-10-06 DIAGNOSIS — I83009 Varicose veins of unspecified lower extremity with ulcer of unspecified site: Secondary | ICD-10-CM | POA: Diagnosis not present

## 2020-10-06 DIAGNOSIS — L97909 Non-pressure chronic ulcer of unspecified part of unspecified lower leg with unspecified severity: Secondary | ICD-10-CM | POA: Diagnosis not present

## 2020-10-06 DIAGNOSIS — I872 Venous insufficiency (chronic) (peripheral): Secondary | ICD-10-CM | POA: Diagnosis not present

## 2020-10-06 DIAGNOSIS — E782 Mixed hyperlipidemia: Secondary | ICD-10-CM | POA: Diagnosis not present

## 2020-10-06 DIAGNOSIS — M130 Polyarthritis, unspecified: Secondary | ICD-10-CM

## 2020-10-06 DIAGNOSIS — J9611 Chronic respiratory failure with hypoxia: Secondary | ICD-10-CM

## 2020-10-06 DIAGNOSIS — G8929 Other chronic pain: Secondary | ICD-10-CM

## 2020-10-06 DIAGNOSIS — I10 Essential (primary) hypertension: Secondary | ICD-10-CM | POA: Diagnosis not present

## 2020-10-06 MED ORDER — GABAPENTIN 100 MG PO CAPS: 100.0000 mg | ORAL_CAPSULE | Freq: Three times a day (TID) | ORAL | 3 refills | Status: DC

## 2020-10-06 NOTE — Progress Notes (Signed)
Subjective:    Patient ID: Erik [name], male    DOB: 02-10-1956, 65 y.o.   MRN: [id number] Chief Complaint  Patient presents with  . Follow-up    Unna wrap follow up    The patient presents today for evaluation of the ulceration on his left posterior calf.  The patient's wound check was delayed by approximately a week or so due to recent hospitalization.  During that time his atrial fibrillation was brought under control.  The patient notes that he feels much better.  He also notes that several of his medications were changed during that time.  The patient's wound has decreased by approximately 50% in size.  Wound bed also looks red and beefy.  It is a drastic improvement from his previous office visit.   Review of Systems  Cardiovascular: Positive for leg swelling.  Skin: Positive for wound.  Neurological: Positive for weakness.  All other systems reviewed and are negative.      Objective:   Physical Exam Vitals reviewed.  HENT:     Head: Normocephalic.  Cardiovascular:     Rate and Rhythm: Normal rate.     Pulses: Normal pulses.  Pulmonary:     Effort: Pulmonary effort is normal.  Musculoskeletal:     Right lower leg: 1+ Edema present.     Left lower leg: 1+ Edema present.  Skin:      Neurological:     Mental Status: He is alert and oriented to person, place, and time.     Motor: Weakness present.     Gait: Gait abnormal.  Psychiatric:        Mood and Affect: Mood normal.        Behavior: Behavior normal.        Thought Content: Thought content normal.        Judgment: Judgment normal.     BP 125/76 (BP Location: Left Arm)   Pulse 84   Resp 16   Past Medical History:  Diagnosis Date  . Arthritis   . Asthma   . Atrial fibrillation (HCC)   . CHF (congestive heart failure) (HCC)   . COPD (chronic obstructive pulmonary disease) (HCC)   . Coronary artery disease   . GERD (gastroesophageal reflux disease)   . Hypertension   . Myocardial infarction (HCC)    . Shortness of breath dyspnea     Social History   Socioeconomic History  . Marital status: Married    Spouse name: Not on file  . Number of children: Not on file  . Years of education: Not on file  . Highest education level: Not on file  Occupational History  . Not on file  Tobacco Use  . Smoking status: Former Smoker    Years: 25.00    Quit date: 03/20/1997    Years since quitting: 23.5  . Smokeless tobacco: Never Used  Vaping Use  . Vaping Use: Never used  Substance and Sexual Activity  . Alcohol use: No  . Drug use: No  . Sexual activity: Not on file  Other Topics Concern  . Not on file  Social History Narrative  . Not on file   Social Determinants of Health   Financial Resource Strain: Not on file  Food Insecurity: Not on file  Transportation Needs: Not on file  Physical Activity: Not on file  Stress: Not on file  Social Connections: Not on file  Intimate Partner Violence: Not on file    Past Surgical History:  Procedure Laterality Date  . ANGIOPLASTY  2009  . CARDIAC CATHETERIZATION N/A 03/18/2015   Procedure: Left Heart Cath with Coronary Angiography;  Surgeon: Lamar Blinks, MD;  Location: [facility] INVASIVE CV LAB;  Service: Cardiovascular;  Laterality: N/A;  . CHOLECYSTECTOMY  2005  . CORONARY ANGIOGRAM  2009    Family History  Problem Relation Age of Onset  . Coronary artery disease Father   . Diabetes Father   . Hyperlipidemia Father   . Hypertension Father   . Stroke Mother   . Hyperlipidemia Mother     Allergies  Allergen Reactions  . Benadryl [Diphenhydramine Hcl] Shortness Of Breath  . Morphine And Related     CBC Latest Ref Rng & Units 02/19/2020 08/14/2017 08/13/2017  WBC 3.4 - 10.8 x10E3/uL 8.5 8.3 6.9  Hemoglobin 13.0 - 17.7 g/dL 46.9 62.9 52.8  Hematocrit 37.5 - 51.0 % 45.6 50.3 40.5  Platelets 150 - 450 x10E3/uL 345 329 220      CMP     Component Value Date/Time   NA 138 02/19/2020 0000   NA 135 (L) 07/19/2013 0417   K  4.0 02/19/2020 0000   K 3.9 07/19/2013 0417   CL 100 02/19/2020 0000   CL 101 07/19/2013 0417   CO2 24 02/19/2020 0000   CO2 29 07/19/2013 0417   GLUCOSE 106 (H) 02/19/2020 0000   GLUCOSE 170 (H) 08/14/2017 0527   GLUCOSE 86 07/19/2013 0417   BUN 14 02/19/2020 0000   BUN 10 07/19/2013 0417   CREATININE 0.87 02/19/2020 0000   CREATININE 0.80 07/19/2013 0417   CALCIUM 9.1 02/19/2020 0000   CALCIUM 8.4 (L) 07/19/2013 0417   PROT 6.3 02/19/2020 0000   PROT 6.6 07/19/2013 0417   ALBUMIN 4.2 02/19/2020 0000   ALBUMIN 2.8 (L) 07/19/2013 0417   AST 14 02/19/2020 0000   AST 33 07/19/2013 0417   ALT 12 02/19/2020 0000   ALT 32 07/19/2013 0417   ALKPHOS 81 02/19/2020 0000   ALKPHOS 85 07/19/2013 0417   BILITOT 0.4 02/19/2020 0000   BILITOT 1.6 (H) 07/19/2013 0417   GFRNONAA 91 02/19/2020 0000   GFRNONAA >60 07/19/2013 0417   GFRAA 105 02/19/2020 0000   GFRAA >60 07/19/2013 0417     No results found.     Assessment & Plan:   1. Venous ulcer (HCC) The patient's wound has drastically decreased by about 50%.  Wound bed looks red and beefy.  He is continuing to tolerate the Unna wraps well.  They will continue to be changed on a weekly basis by home health.  We will have the patient return to the office in the 4 weeks to evaluate progress.  2. Essential hypertension with goal blood pressure less than 130/80 Continue antihypertensive medications as already ordered, these medications have been reviewed and there are no changes at this time.   3. Combined hyperlipidemia Continue statin as ordered and reviewed, no changes at this time    Current Outpatient Medications on File Prior to Visit  Medication Sig Dispense Refill  . apixaban (ELIQUIS) 5 MG TABS tablet Take 1 tablet (5 mg total) by mouth 2 (two) times daily. 60 tablet 5  . diltiazem (CARDIZEM CD) 180 MG 24 hr capsule Take 1 capsule (180 mg total) by mouth daily. 30 capsule 0  . Humidifier MISC Use with O2 at all times 1  each 0  . ipratropium-albuterol (DUONEB) 0.5-2.5 (3) MG/3ML SOLN USE 1 VIAL VIA NEBULIZER EVERY 6 HOURS 360 mL 6  .  loratadine (CLARITIN) 10 MG tablet Take 10 mg by mouth daily.    . metoprolol tartrate (LOPRESSOR) 25 MG tablet Take 1 tablet (25 mg total) by mouth 2 (two) times daily. 180 tablet 1  . OXYGEN Inhale 2-3 L into the lungs daily.    . Roflumilast (DALIRESP) 250 MCG TABS Take one tablet by mouth daily 28 tablet 3  . torsemide (DEMADEX) 10 MG tablet Take 10 mg by mouth daily.    . TRELEGY ELLIPTA 100-62.5-25 MCG/INH AEPB Inhale 1 puff into the lungs daily. 1 each 3   No current facility-administered medications on file prior to visit.    There are no Patient Instructions on file for this visit. No follow-ups on file.   Georgiana Spinner, NP

## 2020-10-06 NOTE — Progress Notes (Signed)
[facility] [address] [place], [place] [address]  Internal MEDICINE  Office Visit Note  Patient Name: Erik [name]  [id number]  [id number]  Date of Service: 10/19/2020  Chief Complaint  Patient presents with  . Follow-up  . Gastroesophageal Reflux  . Hypertension  . left leg pain  . Hip Pain    Popping and snapping    HPI Patient here for routine follow-up Seen by vascular prior to our visit for left lower leg venous ulcer, unna wrap in place--managed weekly by home health Followed by pulmonology for COPD and chronic respiratory failure, continues on supplemental oxygen, recently started on Daliresp, has only been taking for a few days as PA was needed for insurance coverage C/o ongoing chronic hip, back and knee pain Has been an ongoing issue for many years Had previous imaging many years ago and was told at that time he had fairly severe arthritis, has been dealing with the pain but has come to a point where it is becoming unbearable Wanting to discuss options for pain control  Having a difficult time getting around due to the pain and other chronic medical conditions   Current Medication: Outpatient Encounter Medications as of 10/06/2020  Medication Sig  . apixaban (ELIQUIS) 5 MG TABS tablet Take 1 tablet (5 mg total) by mouth 2 (two) times daily.  [name] diltiazem (CARDIZEM CD) 180 MG 24 hr capsule Take 1 capsule (180 mg total) by mouth daily.  [name] gabapentin (NEURONTIN) 100 MG capsule Take 1 capsule (100 mg total) by mouth 3 (three) times daily.  . Humidifier MISC Use with O2 at all times  . ipratropium-albuterol (DUONEB) 0.5-2.5 (3) MG/3ML SOLN USE 1 VIAL VIA NEBULIZER EVERY 6 HOURS  . loratadine (CLARITIN) 10 MG tablet Take 10 mg by mouth daily.  . metoprolol tartrate (LOPRESSOR) 25 MG tablet Take 1 tablet (25 mg total) by mouth 2 (two) times daily.  . OXYGEN Inhale 2-3 L into the lungs daily.  . Roflumilast (DALIRESP) 250 MCG TABS Take one tablet by mouth  daily  . torsemide (DEMADEX) 10 MG tablet Take 10 mg by mouth daily.  . TRELEGY ELLIPTA 100-62.5-25 MCG/INH AEPB Inhale 1 puff into the lungs daily.   No facility-administered encounter medications on file as of 10/06/2020.    Surgical History: Past Surgical History:  Procedure Laterality Date  . ANGIOPLASTY  2009  . CARDIAC CATHETERIZATION N/A 03/18/2015   Procedure: Left Heart Cath with Coronary Angiography;  Surgeon: Lamar Blinks, MD;  Location: [facility] INVASIVE CV LAB;  Service: Cardiovascular;  Laterality: N/A;  . CHOLECYSTECTOMY  2005  . CORONARY ANGIOGRAM  2009    Medical History: Past Medical History:  Diagnosis Date  . Arthritis   . Asthma   . Atrial fibrillation (HCC)   . CHF (congestive heart failure) (HCC)   . COPD (chronic obstructive pulmonary disease) (HCC)   . Coronary artery disease   . GERD (gastroesophageal reflux disease)   . Hypertension   . Myocardial infarction (HCC)   . Shortness of breath dyspnea     Family History: Family History  Problem Relation Age of Onset  . Coronary artery disease Father   . Diabetes Father   . Hyperlipidemia Father   . Hypertension Father   . Stroke Mother   . Hyperlipidemia Mother     Social History   Socioeconomic History  . Marital status: Married    Spouse name: Not on file  . Number of children: Not on file  .  Years of education: Not on file  . Highest education level: Not on file  Occupational History  . Not on file  Tobacco Use  . Smoking status: Former Smoker    Years: 25.00    Quit date: 03/20/1997    Years since quitting: 23.6  . Smokeless tobacco: Never Used  Vaping Use  . Vaping Use: Never used  Substance and Sexual Activity  . Alcohol use: No  . Drug use: No  . Sexual activity: Not on file  Other Topics Concern  . Not on file  Social History Narrative  . Not on file   Social Determinants of Health   Financial Resource Strain: Not on file  Food Insecurity: Not on file  Transportation  Needs: Not on file  Physical Activity: Not on file  Stress: Not on file  Social Connections: Not on file  Intimate Partner Violence: Not on file      Review of Systems  Constitutional: Negative for chills, fatigue and unexpected weight change.  HENT: Negative for congestion, postnasal drip, rhinorrhea, sneezing and sore throat.   Eyes: Negative for redness.  Respiratory: Positive for shortness of breath and wheezing. Negative for cough and chest tightness.   Cardiovascular: Negative for chest pain and palpitations.  Gastrointestinal: Negative for abdominal pain, constipation, diarrhea, nausea and vomiting.  Genitourinary: Negative for dysuria, frequency and scrotal swelling.  Musculoskeletal: Positive for arthralgias, back pain and gait problem. Negative for joint swelling and neck pain.  Skin: Negative for rash.       Ulcer to left leg  Neurological: Positive for weakness. Negative for tremors and numbness.  Hematological: Negative for adenopathy. Does not bruise/bleed easily.  Psychiatric/Behavioral: Negative for behavioral problems (Depression), sleep disturbance and suicidal ideas. The patient is not nervous/anxious.     Vital Signs: BP 128/68   Pulse 80   Temp (!) 97.4 F (36.3 C)   Resp 16   Ht 5\' 10"  (1.778 m)   Wt 226 lb (102.5 kg)   SpO2 95%   BMI 32.43 kg/m    Physical Exam Vitals reviewed.  Constitutional:      Appearance: He is obese. He is ill-appearing.  Cardiovascular:     Rate and Rhythm: Normal rate and regular rhythm.     Pulses: Normal pulses.     Heart sounds: Normal heart sounds.  Pulmonary:     Effort: Pulmonary effort is normal.     Breath sounds: Wheezing present.  Abdominal:     General: Abdomen is flat.     Palpations: Abdomen is soft.  Musculoskeletal:     Cervical back: Normal range of motion.     Right lower leg: Swelling present. Edema present.     Left lower leg: Swelling present. Edema present.     Comments: Strength RUE 4/5, LUE  4/5, full range of motion Strength RLE 3/5, LLE 3/5--pain 8/10 to LLE Ulceration present--unable to assess due to una boot placement from vascular surgery Unable to ambulate at this time due to pain and una boot secondary to ulceration  Skin:    General: Skin is warm.  Neurological:     General: No focal deficit present.     Mental Status: He is alert and oriented to person, place, and time. Mental status is at baseline.  Psychiatric:        Mood and Affect: Mood normal.        Behavior: Behavior normal.        Thought Content: Thought content normal.  Judgment: Judgment normal.    Assessment/Plan: 1. Chronic respiratory failure with hypoxia (HCC) Continue with all supportive measures Difficulty ambulating--shortness of breath with exertion, continuous oxygen therapy further complicating ambulation - For home use only DME Other see comment  2. Venous insufficiency of both lower extremities Contributes to difficulty with ambulation due to pain - For home use only DME Other see comment  3. Venous ulcer (HCC) Managed by vascular surgery, contributing to pain and difficulty ambulating - For home use only DME Other see comment  4. Arthritis of multiple sites Contributing to pain and difficulty ambulating Start low dose gabapentin and assess pain response - gabapentin (NEURONTIN) 100 MG capsule; Take 1 capsule (100 mg total) by mouth 3 (three) times daily.  Dispense: 90 capsule; Refill: 3 - For home use only DME Other see comment  5. Other chronic pain Start low dose gabapentin, follow-up on tolerability and response to pain - gabapentin (NEURONTIN) 100 MG capsule; Take 1 capsule (100 mg total) by mouth 3 (three) times daily.  Dispense: 90 capsule; Refill: 3 - For home use only DME Other see comment  General Counseling: Lakin verbalizes understanding of the findings of todays visit and agrees with plan of treatment. I have discussed any further diagnostic evaluation that  may be needed or ordered today. We also reviewed his medications today. he has been encouraged to call the office with any questions or concerns that should arise related to todays visit.    Orders Placed This Encounter  Procedures  . For home use only DME Other see comment    Meds ordered this encounter  Medications  . gabapentin (NEURONTIN) 100 MG capsule    Sig: Take 1 capsule (100 mg total) by mouth 3 (three) times daily.    Dispense:  90 capsule    Refill:  3    Time spent: 30 Minutes Time spent includes review of chart, medications, test results and follow-up plan with the patient.  This patient was seen by Leeanne Deed AGNP-C in Collaboration with Dr Lyndon Code as a part of collaborative care agreement     Lubertha Basque. Kimra Kantor AGNP-C Internal medicine

## 2020-10-07 ENCOUNTER — Telehealth: Payer: Self-pay

## 2020-10-07 NOTE — Telephone Encounter (Signed)
Called to give the patient echo results. [email or web address] 

## 2020-10-07 NOTE — Telephone Encounter (Signed)
Patient made aware of echo results with verbalized understanding. 

## 2020-10-07 NOTE — Telephone Encounter (Signed)
-----   Message from Iran Ouch, MD sent at 10/06/2020  5:01 PM EST ----- Inform patient that echo showed mildly reduced ejection fraction.  Follow-up as planned.

## 2020-10-09 DIAGNOSIS — I872 Venous insufficiency (chronic) (peripheral): Secondary | ICD-10-CM | POA: Diagnosis not present

## 2020-10-09 DIAGNOSIS — Z4800 Encounter for change or removal of nonsurgical wound dressing: Secondary | ICD-10-CM | POA: Diagnosis not present

## 2020-10-09 DIAGNOSIS — Z87891 Personal history of nicotine dependence: Secondary | ICD-10-CM | POA: Diagnosis not present

## 2020-10-09 DIAGNOSIS — L97921 Non-pressure chronic ulcer of unspecified part of left lower leg limited to breakdown of skin: Secondary | ICD-10-CM | POA: Diagnosis not present

## 2020-10-09 DIAGNOSIS — I4891 Unspecified atrial fibrillation: Secondary | ICD-10-CM | POA: Diagnosis not present

## 2020-10-09 DIAGNOSIS — I509 Heart failure, unspecified: Secondary | ICD-10-CM | POA: Diagnosis not present

## 2020-10-09 DIAGNOSIS — I110 Hypertensive heart disease with heart failure: Secondary | ICD-10-CM | POA: Diagnosis not present

## 2020-10-09 DIAGNOSIS — J449 Chronic obstructive pulmonary disease, unspecified: Secondary | ICD-10-CM | POA: Diagnosis not present

## 2020-10-09 DIAGNOSIS — M6281 Muscle weakness (generalized): Secondary | ICD-10-CM | POA: Diagnosis not present

## 2020-10-09 DIAGNOSIS — I2510 Atherosclerotic heart disease of native coronary artery without angina pectoris: Secondary | ICD-10-CM | POA: Diagnosis not present

## 2020-10-15 DIAGNOSIS — R0902 Hypoxemia: Secondary | ICD-10-CM | POA: Diagnosis not present

## 2020-10-15 DIAGNOSIS — J449 Chronic obstructive pulmonary disease, unspecified: Secondary | ICD-10-CM | POA: Diagnosis not present

## 2020-10-16 DIAGNOSIS — L97921 Non-pressure chronic ulcer of unspecified part of left lower leg limited to breakdown of skin: Secondary | ICD-10-CM | POA: Diagnosis not present

## 2020-10-16 DIAGNOSIS — Z87891 Personal history of nicotine dependence: Secondary | ICD-10-CM | POA: Diagnosis not present

## 2020-10-16 DIAGNOSIS — I2510 Atherosclerotic heart disease of native coronary artery without angina pectoris: Secondary | ICD-10-CM | POA: Diagnosis not present

## 2020-10-16 DIAGNOSIS — I4891 Unspecified atrial fibrillation: Secondary | ICD-10-CM | POA: Diagnosis not present

## 2020-10-16 DIAGNOSIS — J449 Chronic obstructive pulmonary disease, unspecified: Secondary | ICD-10-CM | POA: Diagnosis not present

## 2020-10-16 DIAGNOSIS — J439 Emphysema, unspecified: Secondary | ICD-10-CM | POA: Diagnosis not present

## 2020-10-16 DIAGNOSIS — I509 Heart failure, unspecified: Secondary | ICD-10-CM | POA: Diagnosis not present

## 2020-10-16 DIAGNOSIS — I872 Venous insufficiency (chronic) (peripheral): Secondary | ICD-10-CM | POA: Diagnosis not present

## 2020-10-16 DIAGNOSIS — M6281 Muscle weakness (generalized): Secondary | ICD-10-CM | POA: Diagnosis not present

## 2020-10-16 DIAGNOSIS — I110 Hypertensive heart disease with heart failure: Secondary | ICD-10-CM | POA: Diagnosis not present

## 2020-10-16 DIAGNOSIS — Z4800 Encounter for change or removal of nonsurgical wound dressing: Secondary | ICD-10-CM | POA: Diagnosis not present

## 2020-10-19 ENCOUNTER — Encounter: Payer: Self-pay | Admitting: Hospice and Palliative Medicine

## 2020-10-20 ENCOUNTER — Telehealth: Payer: Self-pay

## 2020-10-20 NOTE — Telephone Encounter (Signed)
Faxed power wheelchair order  to [facility]

## 2020-10-22 DIAGNOSIS — I509 Heart failure, unspecified: Secondary | ICD-10-CM | POA: Diagnosis not present

## 2020-10-22 DIAGNOSIS — Z4800 Encounter for change or removal of nonsurgical wound dressing: Secondary | ICD-10-CM | POA: Diagnosis not present

## 2020-10-22 DIAGNOSIS — L97921 Non-pressure chronic ulcer of unspecified part of left lower leg limited to breakdown of skin: Secondary | ICD-10-CM | POA: Diagnosis not present

## 2020-10-22 DIAGNOSIS — I872 Venous insufficiency (chronic) (peripheral): Secondary | ICD-10-CM | POA: Diagnosis not present

## 2020-10-22 DIAGNOSIS — Z87891 Personal history of nicotine dependence: Secondary | ICD-10-CM | POA: Diagnosis not present

## 2020-10-22 DIAGNOSIS — M6281 Muscle weakness (generalized): Secondary | ICD-10-CM | POA: Diagnosis not present

## 2020-10-22 DIAGNOSIS — I2510 Atherosclerotic heart disease of native coronary artery without angina pectoris: Secondary | ICD-10-CM | POA: Diagnosis not present

## 2020-10-22 DIAGNOSIS — J449 Chronic obstructive pulmonary disease, unspecified: Secondary | ICD-10-CM | POA: Diagnosis not present

## 2020-10-22 DIAGNOSIS — I4891 Unspecified atrial fibrillation: Secondary | ICD-10-CM | POA: Diagnosis not present

## 2020-10-22 DIAGNOSIS — I110 Hypertensive heart disease with heart failure: Secondary | ICD-10-CM | POA: Diagnosis not present

## 2020-10-26 DIAGNOSIS — I509 Heart failure, unspecified: Secondary | ICD-10-CM | POA: Diagnosis not present

## 2020-10-26 DIAGNOSIS — I4891 Unspecified atrial fibrillation: Secondary | ICD-10-CM | POA: Diagnosis not present

## 2020-10-26 DIAGNOSIS — Z4800 Encounter for change or removal of nonsurgical wound dressing: Secondary | ICD-10-CM | POA: Diagnosis not present

## 2020-10-26 DIAGNOSIS — I2510 Atherosclerotic heart disease of native coronary artery without angina pectoris: Secondary | ICD-10-CM | POA: Diagnosis not present

## 2020-10-26 DIAGNOSIS — J449 Chronic obstructive pulmonary disease, unspecified: Secondary | ICD-10-CM | POA: Diagnosis not present

## 2020-10-26 DIAGNOSIS — M6281 Muscle weakness (generalized): Secondary | ICD-10-CM | POA: Diagnosis not present

## 2020-10-26 DIAGNOSIS — I110 Hypertensive heart disease with heart failure: Secondary | ICD-10-CM | POA: Diagnosis not present

## 2020-10-26 DIAGNOSIS — L97921 Non-pressure chronic ulcer of unspecified part of left lower leg limited to breakdown of skin: Secondary | ICD-10-CM | POA: Diagnosis not present

## 2020-10-26 DIAGNOSIS — I872 Venous insufficiency (chronic) (peripheral): Secondary | ICD-10-CM | POA: Diagnosis not present

## 2020-10-26 DIAGNOSIS — Z87891 Personal history of nicotine dependence: Secondary | ICD-10-CM | POA: Diagnosis not present

## 2020-10-28 ENCOUNTER — Telehealth: Payer: Self-pay | Admitting: Internal Medicine

## 2020-10-28 ENCOUNTER — Ambulatory Visit: Payer: Medicare Other | Admitting: Family

## 2020-10-28 NOTE — Progress Notes (Signed)
  Chronic Care Management   Note  10/28/2020 Name: Erik [name] MRN: [id number] DOB: 14-Oct-1955  Erik [name] is a 65 y.o. year old male who is a primary care patient of Lyndon Code, MD. I reached out to [facility] by phone today in response to a referral sent by [name] PCP, Lyndon Code, MD.   [name] was given information about Chronic Care Management services today including:  1. CCM service includes personalized support from designated clinical staff supervised by his physician, including individualized plan of care and coordination with other care providers 2. 24/7 contact phone numbers for assistance for urgent and routine care needs. 3. Service will only be billed when office clinical staff spend 20 minutes or more in a month to coordinate care. 4. Only one practitioner may furnish and bill the service in a calendar month. 5. The patient may stop CCM services at any time (effective at the end of the month) by phone call to the office staff.   Patient agreed to services and verbal consent obtained.   Follow up plan:   Carley Perdue [employer] Scheduler

## 2020-10-29 ENCOUNTER — Telehealth: Payer: Self-pay | Admitting: Pharmacist

## 2020-10-29 NOTE — Progress Notes (Signed)
Chronic Care Management Pharmacy Note  11/04/2020 Name:  Erik [name] MRN:  [id number] DOB:  Jun 30, 1956  Subjective: Erik [name] is an 65 y.o. year old male who is a primary patient of Humphrey Rolls, Timoteo Gaul, MD.  The CCM team was consulted for assistance with disease management and care coordination needs.    Engaged with patient face to face for initial visit in response to provider referral for pharmacy case management and/or care coordination services.   Consent to Services:  The patient was given the following information about Chronic Care Management services today, agreed to services, and gave verbal consent: 1. CCM service includes personalized support from designated clinical staff supervised by the primary care provider, including individualized plan of care and coordination with other care providers 2. 24/7 contact phone numbers for assistance for urgent and routine care needs. 3. Service will only be billed when office clinical staff spend 20 minutes or more in a month to coordinate care. 4. Only one practitioner may furnish and bill the service in a calendar month. 5.The patient may stop CCM services at any time (effective at the end of the month) by phone call to the office staff. 6. The patient will be responsible for cost sharing (co-pay) of up to 20% of the service fee (after annual deductible is met). Patient agreed to services and consent obtained.  Patient Care Team: Lavera Guise, MD as PCP - General (Internal Medicine) Wellington Hampshire, MD as PCP - Cardiology (Cardiology) Edythe Clarity, [facility] as Pharmacist (Pharmacist)  Recent office visits: 10/06/20 [facility]) - Started on gabapentin 1567m tid for complaints of arthritis and other pain  08/25/20 (Humphrey Rolls - televisit complains of dry mouth.  Added humidifier to see if this helps with air in home. Recent consult visits: 09/29/20 ([facility]) - Metoprolol 27mtwice daily started for HF - plan to switch to Toprol XL  at follow up.  Also started on Eliquis 67m91mwice daily ith CHADS2VASc of 4.  Hospital visits: None in previous 6 months  Objective:  Lab Results  Component Value Date   CREATININE 0.80 11/02/2020   BUN 13 11/02/2020   GFRNONAA >60 11/02/2020   GFRAA 105 02/19/2020   NA 137 11/02/2020   K 4.4 11/02/2020   CALCIUM 9.1 11/02/2020   CO2 27 11/02/2020   GLUCOSE 101 (H) 11/02/2020    Lab Results  Component Value Date/Time   HGBA1C 6.3 07/19/2013 04:17 AM    Last diabetic Eye exam: No results found for: HMDIABEYEEXA  Last diabetic Foot exam: No results found for: HMDIABFOOTEX   Lab Results  Component Value Date   CHOL 223 (H) 11/02/2020   HDL 57 11/02/2020   LDLCALC 149 (H) 11/02/2020   LDLDIRECT 140.3 (H) 11/02/2020   TRIG 83 11/02/2020   CHOLHDL 3.9 11/02/2020    Hepatic Function Latest Ref Rng & Units 02/19/2020 07/19/2013 07/18/2013  Total Protein 6.0 - 8.5 g/dL 6.3 6.6 7.5  Albumin 3.8 - 4.8 g/dL 4.2 2.8(L) 3.5  AST 0 - 40 IU/L 14 33 22  ALT 0 - 44 IU/L 12 32 32  Alk Phosphatase 48 - 121 IU/L 81 85 89  Total Bilirubin 0.0 - 1.2 mg/dL 0.4 1.6(H) 1.3(H)    Lab Results  Component Value Date/Time   TSH 1.380 02/19/2020 12:00 AM   TSH 0.643 07/19/2013 04:17 AM   FREET4 1.39 02/19/2020 12:00 AM    CBC Latest Ref Rng & Units 11/02/2020 02/19/2020 08/14/2017  WBC 4.0 -  10.5 K/uL 8.5 8.5 8.3  Hemoglobin 13.0 - 17.0 g/dL 15.2 15.1 16.4  Hematocrit 39.0 - 52.0 % 45.8 45.6 50.3  Platelets 150 - 400 K/uL 350 345 329    No results found for: VD25OH  Clinical ASCVD: Yes  The 10-year ASCVD risk score Mikey Bussing DC Jr., et al., 2013) is: 17.9%   Values used to calculate the score:     Age: 59 years     Sex: Male     Is Non-Hispanic African American: No     Diabetic: No     Tobacco smoker: No     Systolic Blood Pressure: 762 mmHg     Is BP treated: Yes     HDL Cholesterol: 57 mg/dL     Total Cholesterol: 223 mg/dL    Depression screen [facility] 2/9 08/25/2020 05/14/2020 03/31/2020   Decreased Interest 0 0 3  Down, Depressed, Hopeless 0 0 3  PHQ - 2 Score 0 0 6      Social History   Tobacco Use  Smoking Status Former Smoker  . Years: 25.00  . Quit date: 03/20/1997  . Years since quitting: 23.6  Smokeless Tobacco Never Used   BP Readings from Last 3 Encounters:  11/03/20 (!) 138/92  11/02/20 120/72  10/06/20 128/68   Pulse Readings from Last 3 Encounters:  11/03/20 69  11/02/20 84  10/06/20 80   Wt Readings from Last 3 Encounters:  11/03/20 227 lb (103 kg)  11/02/20 227 lb (103 kg)  10/06/20 226 lb (102.5 kg)   BMI Readings from Last 3 Encounters:  11/03/20 32.57 kg/m  11/02/20 32.57 kg/m  10/06/20 32.43 kg/m    Assessment/Interventions: Review of patient past medical history, allergies, medications, health status, including review of consultants reports, laboratory and other test data, was performed as part of comprehensive evaluation and provision of chronic care management services.   SDOH:  (Social Determinants of Health) assessments and interventions performed: Yes  SDOH Screenings   Alcohol Screen: Low Risk   . Last Alcohol Screening Score (AUDIT): 0  Depression (PHQ2-9): Low Risk   . PHQ-2 Score: 0  Financial Resource Strain: Low Risk   . Difficulty of Paying Living Expenses: Not hard at all  Food Insecurity: Not on file  Housing: Not on file  Physical Activity: Not on file  Social Connections: Not on file  Stress: Not on file  Tobacco Use: Medium Risk  . Smoking Tobacco Use: Former Smoker  . Smokeless Tobacco Use: Never Used  Transportation Needs: Not on file    CCM Care Plan  Allergies  Allergen Reactions  . Benadryl [Diphenhydramine Hcl] Shortness Of Breath  . Morphine And Related     Medications Reviewed Today    Reviewed by Edythe Clarity, [facility] (Pharmacist) on 11/04/20 at [place] List Status: <None>  Medication Order Taking? Sig Documenting Provider Last Dose Status Informant  apixaban (ELIQUIS) 5 MG TABS  tablet [id number] Yes Take 1 tablet (5 mg total) by mouth 2 (two) times daily. Wellington Hampshire, MD Taking Active   gabapentin (NEURONTIN) 100 MG capsule [id number] Yes Take 1 capsule (100 mg total) by mouth 3 (three) times daily. Luiz Ochoa, NP Taking Active   Humidifier [place] [id number] Yes Use with O2 at all times Lavera Guise, MD Taking Active   ipratropium-albuterol (DUONEB) 0.5-2.5 (3) MG/3ML Bailey Mech [id number] Yes USE 1 VIAL VIA NEBULIZER EVERY 6 HOURS Allyne Gee, MD Taking Active   loratadine (CLARITIN) 10 MG tablet [id number]  Yes Take 10 mg by mouth daily. [provider] Taking Active   losartan (COZAAR) 25 MG tablet [id number] Yes Take 0.5 tablets (12.5 mg total) by mouth daily. Loel Dubonnet, NP Taking Active   metoprolol tartrate (LOPRESSOR) 25 MG tablet [id number] Yes Take 2 tablets (50 mg total) by mouth 2 (two) times daily. Loel Dubonnet, NP Taking Active   OXYGEN [id number] Yes Inhale 2-3 L into the lungs daily. [provider] Taking Active   Roflumilast (DALIRESP) 250 MCG TABS [id number] Yes Take one tablet by mouth daily Luiz Ochoa, NP Taking Active   rosuvastatin (CRESTOR) 10 MG tablet [id number] Yes Take 1 tablet (10 mg total) by mouth daily. Loel Dubonnet, NP Taking Active   torsemide (DEMADEX) 10 MG tablet [id number] Yes Take 10 mg by mouth daily. [provider] Taking Active   TRELEGY ELLIPTA 100-62.5-25 MCG/INH AEPB [id number] Yes Inhale 1 puff into the lungs daily. Luiz Ochoa, NP Taking Active           Patient Active Problem List   Diagnosis Date Noted  . Atherosclerosis of native arteries of extremity with intermittent claudication ([place]) 06/01/2020  . Chronic venous insufficiency 06/01/2020  . Dorsalgia 09/11/2017  . Occlusion and stenosis of bilateral carotid arteries 09/11/2017  . Pulmonic heart disease ([place]) 09/11/2017  . COPD (chronic obstructive pulmonary disease) ([place]) 08/14/2017  . Chronic a-fib ([place])  03/27/2015  . CAD (coronary artery disease) 03/02/2015  . 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  . Essential hypertension with goal blood pressure less than 130/80 02/23/2015    Immunization History  Administered Date(s) Administered  . Influenza,inj,Quad PF,6+ Mos 08/15/2017    Conditions to be addressed/monitored:  Afib, HTN, COPD, HLD/CAD  Care Plan : General Pharmacy (Adult)  Updates made by Edythe Clarity, RPH since 11/04/2020 12:00 AM    Problem: Afib, HTN, COPD, HLD/CAD   Priority: High  Onset Date: 11/04/2020    Long-Range Goal: Patient-Specific Goal   Start Date: 11/04/2020  Expected End Date: 05/07/2021  This Visit's Progress: On track  Priority: High  Note:   Current Barriers:  . Unable to independently afford treatment regimen . Unable to achieve control of cholesterol  . Confusion of medication administration.   Pharmacist Clinical Goal(s):  [name] Patient will verbalize ability to afford treatment regimen . achieve control of cholesterol as evidenced by lipid panel . maintain control of blood pressure as evidenced by home monitoring  . achieve ability to self administer medications as prescribed through use of pill packs as evidenced by patient report through collaboration with PharmD and provider.   Interventions: . 1:1 collaboration with Lavera Guise, MD regarding development and update of comprehensive plan of care as evidenced by provider attestation and co-signature . Inter-disciplinary care team collaboration (see longitudinal plan of care) . Comprehensive medication review performed; medication list updated in electronic medical record  Hypertension (BP goal <130/80) -Controlled -Current treatment: . Losartan 12.55m daily . Metoprolol 254mtwo tablets twice daily -Medications previously tried: Diltiazem (d/c)  -Current home readings: no specific readings available, patient has cuff at home and reports it has always been normal  -Current exercise  habits: minimal -Denies hypotensive/hypertensive symptoms -Educated on BP goals and benefits of medications for prevention of heart attack, stroke and kidney damage; Importance of home blood pressure monitoring; Symptoms of hypotension and importance of maintaining adequate hydration; Use caution when going from sitting to standing -Counseled to monitor BP at home daily, document, and provide log at future appointments -  Recommended to continue current medication Counseled on new medication starts and which medication to d/c   -Patient had not picked up losartan yet but was aware of change.  Hyperlipidemia/CAD: (LDL goal < 70) -Uncontrolled -Current treatment: . Rosuvastatin 70m daily -Medications previously tried: pravastatin   -Current exercise habits: minimal -Educated on Cholesterol goals;  Benefits of statin for ASCVD risk reduction; Importance of limiting foods high in cholesterol;  -Had not picked up newly prescribed statin - aware of change and plans to pick up. -Recommended to continue current medication Educated on benefits of taking at bedtime  Atrial Fibrillation (Goal: prevent stroke and major bleeding) -Controlled -CHADSVASC: 4 -Current treatment: . Rate control: Metoprolol tartrate 259mtwo tablets daily . Anticoagulation: Eliquis 12m38mid -Medications previously tried: Diltiazem CD 180 -Home BP and HR readings: no specifics available - patient reports always WNL  -Counseled on increased risk of stroke due to Afib and benefits of anticoagulation for stroke prevention; bleeding risk associated with Eliquis and importance of self-monitoring for signs/symptoms of bleeding; Importance of taking as directed - avoidance of NSAIDs -Recommended to continue current medication Assessed patient finances. He reports copay is manageable currently, however with amount of brand name medications I fear donut hole is in the future.  Will look into PAP programs as he is only on  SSI.  COPD (Goal: control symptoms and prevent exacerbations) -Controlled -Current treatment  . Duoneb 0.5-2.5 mg/3ml62mn . Trelegy 100-62.5-212mcg.inh -Medications previously tried: none noted   -Exacerbations requiring treatment in last 6 months: none -Patient denies consistent use of maintenance inhaler -Frequency of rescue inhaler use: as needed -He reports sometimes he does not use his Trelegy daily, mainly due to cost concerns and trying to make it last longer.  Also cannot afford Daliresp due to copay.  He would really benefit from using maintenance inhaler daily.   -Counseled on Proper inhaler technique; Benefits of consistent maintenance inhaler use -Recommended to continue current medication Assessed patient finances. Will look into PAP programs for both Dailiresp and Trelegy.   Patient Goals/Self-Care Activities . Patient will:  - take medications as prescribed check blood pressure daily, document, and provide at future appointments collaborate with provider on medication access solutions  Follow Up Plan: The care management team will reach out to the patient again over the next 90 days.        Medication Assistance: Process for patient assistance applications for Dailiresp, Trelegy, and Eliquis started.  Patient's preferred pharmacy is:  [facility] [place] [id number]: [phone]: [phone] pill box? No - has one but does not use it. Pt endorses 90% compliance  We discussed: Benefits of medication synchronization, packaging and delivery as well as enhanced pharmacist oversight with [facility]. Patient decided to: Utilize [facility] for medication synchronization, packaging and delivery  Verbal consent obtained for [facility] enhanced pharmacy services (medication synchronization, adherence packaging, delivery coordination). A medication  sync plan was created to allow patient to get all medications delivered once every 30 to 90 days per patient preference. Patient understands they have freedom to choose pharmacy and clinical pharmacist will coordinate care between all prescribers and [facility].   Care Plan and Follow Up Patient Decision:  Patient agrees to Care Plan and Follow-up.  Plan: The care management team will reach out to the patient again over the next 90 days.  ChriBeverly MilcharmD Clinical Pharmacist [employer]  Family Medicine [phone]

## 2020-10-29 NOTE — Progress Notes (Addendum)
° ° °  Chronic Care Management Pharmacy Assistant   Name: Erik [name]  MRN: [id number] DOB: 02/09/1956  Reason for Encounter: Initial Questions  Medications: Outpatient Encounter Medications as of 10/29/2020  Medication Sig   apixaban (ELIQUIS) 5 MG TABS tablet Take 1 tablet (5 mg total) by mouth 2 (two) times daily.   diltiazem (CARDIZEM CD) 180 MG 24 hr capsule Take 1 capsule (180 mg total) by mouth daily.   gabapentin (NEURONTIN) 100 MG capsule Take 1 capsule (100 mg total) by mouth 3 (three) times daily.   Humidifier MISC Use with O2 at all times   ipratropium-albuterol (DUONEB) 0.5-2.5 (3) MG/3ML SOLN USE 1 VIAL VIA NEBULIZER EVERY 6 HOURS   loratadine (CLARITIN) 10 MG tablet Take 10 mg by mouth daily.   metoprolol tartrate (LOPRESSOR) 25 MG tablet Take 1 tablet (25 mg total) by mouth 2 (two) times daily.   OXYGEN Inhale 2-3 L into the lungs daily.   Roflumilast (DALIRESP) 250 MCG TABS Take one tablet by mouth daily   torsemide (DEMADEX) 10 MG tablet Take 10 mg by mouth daily.   TRELEGY ELLIPTA 100-62.5-25 MCG/INH AEPB Inhale 1 puff into the lungs daily.   No facility-administered encounter medications on file as of 10/29/2020.   Have you seen any other providers since your last visit? Patient stated no.    Any changes in your medications or health? Patient stated no.   Any side effects from any medications? Patient stated headaches, numbness in fingers/toes since he started Eliquis.    Do you have an symptoms or problems not managed by your medications? Patient stated he has been having back and joint pain.   Any concerns about your health right now? Patient stated his heart rate from the pulse ox has been in the 30's  Has your provider asked that you check blood pressure, blood sugar, or follow special diet at home? Patient stated he checks his blood pressure and he stays away from salt.   Do you get any type of exercise on a regular basis? Patient stated no but he is trying  to start.   Can you think of a goal you would like to reach for your health? Patient stated he would like to walk better.   Do you have any problems getting your medications? Patient stated he is unable to get his Roflumilast 250 mg because of the price.  Is there anything that you would like to discuss during the appointment? Patient stated no.  Please bring medications and supplements to appointment, patient reminded.    Follow-Up:Pharmacist Review  Hulen Luster, RMA Clinical Pharmacist Assistant [phone]  6 minutes spent in review, coordination, and documentation.  Reviewed by: Willa Frater, PharmD Clinical Pharmacist [facility] [phone]

## 2020-11-02 ENCOUNTER — Other Ambulatory Visit: Payer: Self-pay

## 2020-11-02 ENCOUNTER — Other Ambulatory Visit
Admission: RE | Admit: 2020-11-02 | Discharge: 2020-11-02 | Disposition: A | Discharge status: Home / Self Care | Payer: Medicare Other | Attending: Family | Admitting: Family

## 2020-11-02 ENCOUNTER — Ambulatory Visit: Payer: Medicare Other | Admitting: Family

## 2020-11-02 ENCOUNTER — Telehealth: Payer: Self-pay

## 2020-11-02 ENCOUNTER — Encounter: Payer: Self-pay | Admitting: Family

## 2020-11-02 VITALS — BP 120/72 | HR 84 | Ht 70.0 in | Wt 227.0 lb

## 2020-11-02 DIAGNOSIS — Z7901 Long term (current) use of anticoagulants: Secondary | ICD-10-CM | POA: Diagnosis not present

## 2020-11-02 DIAGNOSIS — I482 Chronic atrial fibrillation, unspecified: Secondary | ICD-10-CM | POA: Diagnosis not present

## 2020-11-02 DIAGNOSIS — E785 Hyperlipidemia, unspecified: Secondary | ICD-10-CM | POA: Diagnosis not present

## 2020-11-02 DIAGNOSIS — I5022 Chronic systolic (congestive) heart failure: Secondary | ICD-10-CM

## 2020-11-02 DIAGNOSIS — J441 Chronic obstructive pulmonary disease with (acute) exacerbation: Secondary | ICD-10-CM

## 2020-11-02 DIAGNOSIS — I25118 Atherosclerotic heart disease of native coronary artery with other forms of angina pectoris: Secondary | ICD-10-CM | POA: Insufficient documentation

## 2020-11-02 LAB — BASIC METABOLIC PANEL
Anion gap: 8 (ref 5–15)
BUN: 13 mg/dL (ref 8–23)
CO2: 27 mmol/L (ref 22–32)
Calcium: 9.1 mg/dL (ref 8.9–10.3)
Chloride: 102 mmol/L (ref 98–111)
Creatinine, Ser: 0.8 mg/dL (ref 0.61–1.24)
GFR, Estimated: >60 mL/min (ref >60)
Glucose, Bld: 101 mg/dL — ABNORMAL HIGH (ref 70–99)
Potassium: 4.4 mmol/L (ref 3.5–5.1)
Sodium: 137 mmol/L (ref 135–145)

## 2020-11-02 LAB — CBC
HCT: 45.8 % (ref 39.0–52.0)
Hemoglobin: 15.2 g/dL (ref 13.0–17.0)
MCH: 30.6 pg (ref 26.0–34.0)
MCHC: 33.2 g/dL (ref 30.0–36.0)
MCV: 92.2 fL (ref 80.0–100.0)
Platelets: 350 10*3/uL (ref 150–400)
RBC: 4.97 MIL/uL (ref 4.22–5.81)
RDW: 13.9 % (ref 11.5–15.5)
WBC: 8.5 10*3/uL (ref 4.0–10.5)
nRBC: 0 % (ref 0.0–0.2)

## 2020-11-02 LAB — LIPID PANEL
Cholesterol: 223 mg/dL — ABNORMAL HIGH (ref 0–200)
HDL: 57 mg/dL (ref >40)
LDL Cholesterol: 149 mg/dL — ABNORMAL HIGH (ref 0–99)
Total CHOL/HDL Ratio: 3.9 RATIO
Triglycerides: 83 mg/dL (ref <150)
VLDL: 17 mg/dL (ref 0–40)

## 2020-11-02 LAB — LDL CHOLESTEROL, DIRECT: Direct LDL: 140.3 mg/dL — ABNORMAL HIGH (ref 0–99)

## 2020-11-02 MED ORDER — METOPROLOL TARTRATE 25 MG PO TABS: 50.0000 mg | ORAL_TABLET | Freq: Two times a day (BID) | ORAL | 1 refills | Status: DC

## 2020-11-02 MED ORDER — LOSARTAN POTASSIUM 25 MG PO TABS: 12.5000 mg | ORAL_TABLET | Freq: Every day | ORAL | 1 refills | Status: DC

## 2020-11-02 NOTE — Progress Notes (Signed)
Office Visit    Patient Name: Erik [name] Date of Encounter: 11/02/2020  PCP:  Lyndon Code, MD   [facility]  Cardiologist:  Lorine Bears, MD  Advanced Practice Provider:  No care team member to display Electrophysiologist:  None    Chief Complaint    Erik [name] is a 65 y.o. male with a hx of coronary artery disease, atrial fibrillation, chronic anticoagulation, HFrEF, COPD on home O2, arthritis, hyperlipidemia presents today for follow-up after addition of metoprolol and echocardiogram  Past Medical History    Past Medical History:  Diagnosis Date  . Arthritis   . Asthma   . Atrial fibrillation (HCC)   . CHF (congestive heart failure) (HCC)   . COPD (chronic obstructive pulmonary disease) (HCC)   . Coronary artery disease   . GERD (gastroesophageal reflux disease)   . Hypertension   . Myocardial infarction (HCC)   . Shortness of breath dyspnea    Past Surgical History:  Procedure Laterality Date  . ANGIOPLASTY  2009  . CARDIAC CATHETERIZATION N/A 03/18/2015   Procedure: Left Heart Cath with Coronary Angiography;  Surgeon: Lamar Blinks, MD;  Location: [facility] INVASIVE CV LAB;  Service: Cardiovascular;  Laterality: N/A;  . CHOLECYSTECTOMY  2005  . CORONARY ANGIOGRAM  2009    Allergies  Allergies  Allergen Reactions  . Benadryl [Diphenhydramine Hcl] Shortness Of Breath  . Morphine And Related     History of Present Illness    [name] is a 64 y.o. male with a hx of coronary artery disease, atrial fibrillation, chronic anticoagulation, HFrEF, COPD on home O2, arthritis, hyperlipidemia last seen 09/29/20 by Dr. Kirke Corin.  Coronary artery disease s/p bare-metal stent 012 2009.  Cardiac catheterization 2016 with patent onto stent with mild in-stent stenosis, chronically occluded proximal RCA with good collateral, moderate mid LAD stenosis and mild LCx disease.  He was previously followed by Dr. Gwen Pounds of [facility]  cardiology. He was hospitalized in 2014 with mild acute pancreatitis and anticoagulation with Pradaxa was discontinued at that time due to compliance issue per documentation. No bleeding event noted.   Previous echocardiogram 08/14/17 EF 55-60% ? 03/13/20 EF 40% (performed at outside facility).  Seen by Dr. Kirke Corin 09/29/2020 as a new patient noting worsening exertional dyspnea as well as intermittent tachycardia since an upper respiratory infection in December. He also noted significant bilateral lower extremity edema though no weight gain.  Ventricular rate was not well controlled with EKG showing atrial fibrillation 111 BPM.  Metoprolol 25 mg twice daily was added with plan to optimize HF medications and potentially transition to Toprol and consideration of cardiac cath at follow up.   He presents today for follow up. We reviewed his echocardiogram in detail though he had some difficulty understanding the goal for his ejection fraction versus heart rate. Reports his BP at home has been well controlled at goal of <130/80. HR has been as low as 39bpm when checked though without lightheadedness nor dizziness and as high as 140bpm. He is unable to estimate an average heart rate. Reports his dyspnea on exertion is stable at his baseline but is able to be off his oxygen sometimes at home without desaturation. He reports occasional left sided chest discomfort that occurs both at rest and with activity. He has not taken any PRN Nitroglycerin. When asked about a statin he tells me he has a previous intolerance but cannot recall which agents he took in the past. N  orthopnea, PND. Reports some improvement in his LE edema since last clinic visit.   EKGs/Labs/Other Studies Reviewed:   The following studies were reviewed today:  Echo 09/30/20 1. Left ventricular ejection fraction, by estimation, is 40 to 45%. The  left ventricle has mild to moderately decreased function. Left ventricular  endocardial border not  optimally defined to evaluate regional wall motion.  Left ventricular diastolic  parameters are indeterminate.   2. Right ventricular systolic function is low normal. The right  ventricular size is normal.   3. Left atrial size was severely dilated.   4. Right atrial size was moderately dilated.   5. The mitral valve is degenerative. Mild mitral valve regurgitation.   6. The aortic valve was not well visualized. Aortic valve regurgitation  is not visualized. Mild aortic valve sclerosis is present, with no  evidence of aortic valve stenosis.   7. The inferior vena cava is normal in size with greater than 50%  respiratory variability, suggesting right atrial pressure of 3 mmHg   LHC 03/2015  Prox RCA lesion, 100% stenosed.  Mid LAD lesion, 40% stenosed.  Mid Cx to Dist Cx lesion, 30% stenosed.   Assessment The patient has had progressive canadian class 3 anginal symptoms with a high probability stress test with risk factors including high blood pressure and high cholesterol.   abnormal left ventricular function with ejection fraction of 35%   severe 1 vessel coronary artery disease    There is significant stenosis of right coronary artery   Plan Continue medical management of CAD risk factors, Additional medications for management of angina and No further cardiac intervention at this time  EKG:  EKG is ordered today.  The ekg ordered today demonstrates rate controlled atrial fibrillation 84bpm  Recent Labs: 02/19/2020: ALT 12; TSH 1.380 11/02/2020: BUN 13; Creatinine, Ser 0.80; Hemoglobin 15.2; Platelets 350; Potassium 4.4; Sodium 137  Recent Lipid Panel    Component Value Date/Time   CHOL 223 (H) 11/02/2020 1649   CHOL 184 02/19/2020 0000   CHOL 146 07/18/2013 0934   TRIG 83 11/02/2020 1649   TRIG 53 07/18/2013 0934   HDL 57 11/02/2020 1649   HDL 49 02/19/2020 0000   HDL 48 07/18/2013 0934   CHOLHDL 3.9 11/02/2020 1649   VLDL 17 11/02/2020 1649   VLDL 11 07/18/2013  0934   LDLCALC 149 (H) 11/02/2020 1649   LDLCALC 115 (H) 02/19/2020 0000   LDLCALC 87 07/18/2013 0934    Risk Assessment/Calculations:   CHA2DS2-VASc Score = 3  This indicates a 3.2% annual risk of stroke. The patient's score is based upon: CHF History: Yes HTN History: No Diabetes History: No Stroke History: No Vascular Disease History: Yes Age Score: 1 Gender Score: 0  Home Medications   Current Meds  Medication Sig  . apixaban (ELIQUIS) 5 MG TABS tablet Take 1 tablet (5 mg total) by mouth 2 (two) times daily.  [name] gabapentin (NEURONTIN) 100 MG capsule Take 1 capsule (100 mg total) by mouth 3 (three) times daily.  . Humidifier MISC Use with O2 at all times  . ipratropium-albuterol (DUONEB) 0.5-2.5 (3) MG/3ML SOLN USE 1 VIAL VIA NEBULIZER EVERY 6 HOURS  . loratadine (CLARITIN) 10 MG tablet Take 10 mg by mouth daily.  [name] losartan (COZAAR) 25 MG tablet Take 0.5 tablets (12.5 mg total) by mouth daily.  . OXYGEN Inhale 2-3 L into the lungs daily.  . Roflumilast (DALIRESP) 250 MCG TABS Take one tablet by mouth daily  . torsemide (DEMADEX)  10 MG tablet Take 10 mg by mouth daily.  . TRELEGY ELLIPTA 100-62.5-25 MCG/INH AEPB Inhale 1 puff into the lungs daily.  . [DISCONTINUED] diltiazem (CARDIZEM CD) 180 MG 24 hr capsule Take 1 capsule (180 mg total) by mouth daily.  . [DISCONTINUED] metoprolol tartrate (LOPRESSOR) 25 MG tablet Take 1 tablet (25 mg total) by mouth 2 (two) times daily.     Review of Systems  All other systems reviewed and are otherwise negative except as noted above.  Physical Exam    VS:  BP 120/72 (BP Location: Right Arm, Patient Position: Sitting, Cuff Size: Normal)   Pulse 84   Ht 5\' 10"  (1.778 m)   Wt 227 lb (103 kg)   SpO2 97% Comment: 2 Liters of Oxygen  BMI 32.57 kg/m  , BMI Body mass index is 32.57 kg/m.  Wt Readings from Last 3 Encounters:  11/02/20 227 lb (103 kg)  10/06/20 226 lb (102.5 kg)  09/29/20 224 lb 4 oz (101.7 kg)    GEN: Well  nourished, well developed, in no acute distress. HEENT: normal. Neck: Supple, no JVD, carotid bruits, or masses. Cardiac: Irregularly irregular, no murmurs, rubs, or gallops. No clubbing, cyanosis, edema.  Radials 2+ and equal bilaterally.  Respiratory:  Respirations regular and unlabored, clear to auscultation bilaterally. On 2L O2. GI: Soft, nontender, nondistended. MS: No deformity or atrophy. Skin: Warm and dry, no rash. LLE in 10/01/20.  Neuro:  Strength and sensation are intact. Psych: Normal affect.  Assessment & Plan    1. HFrEF - Echo 03/18/20 with newly reduced LVEF 40%. Not seen in cardiology follow up until 09/2020 with echo 09/30/20 LVEF 40-45%. NYHA II-III with dyspnea, orthopnea.   Will need optimization of GDMT: stop Diltiazem as it is contraindicated in HFrEF, increase Metoprolol tartrate to 50mg  BID for adequate HR control, start Losartan 12.5mg  daily for BP control. Continue Torsemide 20mg  daily.   Future consideration include addition of MRA and transition to Toprol.   Discussed indication for ischemic evaluation in the setting of newly reduced LVEF. We discussed cardiac catheterization and that risks include but are not limited to stroke (1 in 1000), death (1 in 1000), kidney failure [usually temporary] (1 in 500), bleeding (1 in 200), allergic reaction [possibly serious] (1 in 200). He is hesitant and wishes to think about it.   May benefit from cardiac rehab, discuss at follow up.   BMP, CBC today.   2. Chronic atrial fibrillation / chronic anticoagulation - Rate control improved since addition of Metoprolol Tartrate. As we are discontinuing Diltiazem, increase dose as detailed above. Reports tolerating Eliquis 5mg  BID. CHA2DS2-VASc Score = 3 [CHF History: Yes, HTN History: No, Diabetes History: No, Stroke History: No, Vascular Disease History: Yes, Age Score: 1, Gender Score: 0].  Therefore, the patient's annual risk of stroke is 3.2 %.  CBC today for monitoring.    3. CAD - Reports intermittent left sided chest pain. EKG today rate controlled atrial fibrillation with no acute ST/T wave changes. Discussed indication for ischemic evaluation in setting of newly reduced LVEF, as above. GDMT includes beta blocker. Recommended he initiate statin, as below. No aspirin secondary to chronic anticoagulation.   4. Venous ulcer - Follows with [facility]. LLE with unna boot in place.   5. COPD - Continue to follow with pulmonology. No signs of acute exacerbation.   6. HLD, LDL goal <70 - 02/19/20 LDL 115. Previous intolerance to statin, but uncertain which. Per previous cardiologists documentation he has been  on Pravastatin 20mg  daily though was concerned about "cholesterol medication causing cancer". Lipid panel, direct LDL, CMP today. In setting of CAD, moderate or high intensity statin is indicated. Pending lipid results would recommend addition of Crestor.  Disposition: Follow up in 2-3 week(s) with Dr. Kirke CorinArida or APP.   Signed, Alver Sorrowaitlin S Vali Capano, NP 11/02/2020, 6:11 PM [facility]

## 2020-11-02 NOTE — Telephone Encounter (Signed)
Spoke with pt he refused to get power wheel chair due to its heavy

## 2020-11-02 NOTE — Patient Instructions (Addendum)
Medication Instructions:  Your provider has recommended you make the following change in your medication:   STOP Diltiazem  CHANGE Metoprolol Tartrate to 25mg - take 2 tablets (50 mg) twice daily *This is to keep your heart rate well controlled*  START Losartan 25- take half tablet (12.5 mg) daily *This is to help with blood pressure and to strengthen your heart*  *If you need a refill on your cardiac medications before your next appointment, please call your pharmacy*  Lab Work: Your provider recommends lab work today: CMET, direct LDL, lipid panel, CBC  If you have labs (blood work) drawn today and your tests are completely normal, you will receive your results only by: MyChart Message (if you have MyChart) OR . A paper copy in the mail If you have any lab test that is abnormal or we need to change your treatment, we will call you to review the results.  Testing/Procedures: Your physician has requested that you have a cardiac catheterization. Cardiac catheterization is used to diagnose and/or treat various heart conditions. Doctors may recommend this procedure for a number of different reasons. The most common reason is to evaluate chest pain. Chest pain can be a symptom of coronary artery disease (CAD), and cardiac catheterization can show whether plaque is narrowing or blocking your heart's arteries. This procedure is also used to evaluate the valves, as well as measure the blood flow and oxygen levels in different parts of your heart. Please follow instruction sheet, as given. We are concerned that a new blockage in your heart could be causing your reduced heart pumping function - if you decide you wish to have this testing done, please call our office.   Follow-Up: At [facility], you and your health needs are our priority.  As part of our continuing mission to provide you with exceptional heart care, we have created designated Provider Care Teams.  These Care Teams include your  primary Cardiologist (physician) and Advanced Practice Providers (APPs -  Physician Assistants and Nurse Practitioners) who all work together to provide you with the care you need, when you need it.  We recommend signing up for the patient portal called "MyChart".  Sign up information is provided on this After Visit Summary.  MyChart is used to connect with patients for Virtual Visits (Telemedicine).  Patients are able to view lab/test results, encounter notes, upcoming appointments, etc.  Non-urgent messages can be sent to your provider as well.   To learn more about what you can do with MyChart, go to [facility].    Your next appointment:   2-3 week(s)  The format for your next appointment:   In Person  Provider:   You may see [email or web address], MD or one of the following Advanced Practice Providers on your designated Care Team:    Lorine Bears, NP  Nicolasa Ducking, PA-C  Eula Listen, PA-C  Cadence Marisue Ivan, Fransico Michael  [place], NP  Other Instructions  Heart Healthy Diet Recommendations: A low-salt diet is recommended. Aim for for less than 2g of sodium per day.  Meats should be grilled, baked, or boiled. Avoid fried foods. Focus on lean protein sources like fish or chicken with vegetables and fruits. The American Heart Association is a Gillian Shields!  American Heart Association Diet and Lifeystyle Recommendations   Recommend weighing daily and keeping a log. Report weight gain of 2 pounds overnight or 5 pounds in 1 week.

## 2020-11-03 ENCOUNTER — Ambulatory Visit (INDEPENDENT_AMBULATORY_CARE_PROVIDER_SITE_OTHER): Payer: Medicare Other | Admitting: Nurse Practitioner

## 2020-11-03 ENCOUNTER — Encounter (INDEPENDENT_AMBULATORY_CARE_PROVIDER_SITE_OTHER): Payer: Self-pay | Admitting: Nurse Practitioner

## 2020-11-03 ENCOUNTER — Telehealth: Payer: Self-pay | Admitting: *Deleted

## 2020-11-03 VITALS — BP 138/92 | HR 69 | Resp 16 | Wt 227.0 lb

## 2020-11-03 DIAGNOSIS — L97909 Non-pressure chronic ulcer of unspecified part of unspecified lower leg with unspecified severity: Secondary | ICD-10-CM

## 2020-11-03 DIAGNOSIS — E782 Mixed hyperlipidemia: Secondary | ICD-10-CM

## 2020-11-03 DIAGNOSIS — I10 Essential (primary) hypertension: Secondary | ICD-10-CM | POA: Diagnosis not present

## 2020-11-03 DIAGNOSIS — I83022 Varicose veins of left lower extremity with ulcer of calf: Secondary | ICD-10-CM | POA: Diagnosis not present

## 2020-11-03 DIAGNOSIS — I83009 Varicose veins of unspecified lower extremity with ulcer of unspecified site: Secondary | ICD-10-CM

## 2020-11-03 MED ORDER — ROSUVASTATIN CALCIUM 10 MG PO TABS: 10.0000 mg | ORAL_TABLET | Freq: Every day | ORAL | 5 refills | Status: DC

## 2020-11-03 NOTE — Telephone Encounter (Signed)
-----   Message from Alver Sorrow, NP sent at 11/03/2020  7:31 AM EDT ----- CBC no evidence of anemia nor infection. Normal kidney function and electrolytes. Cholesterol panel shows elevated total cholesterol and LDL. Our goal for LDL is <70 to reduce risk for cardiovascular disease, recommend starting Rosuvastatin 10mg  daily.   Previously on Pravastatin. Unclear if he had side effects. Rosuvastatin is typically very well tolerated and we are starting at a low dose.

## 2020-11-03 NOTE — Telephone Encounter (Signed)
Spoke to pt. Notified of lab results and provider's recc.  Pt verbalized understanding.  He does confirm that he took Pravastatin in the past and does not recall any side effects.  He does have generalized joint pain now unrelated to statin.  Pt agrees to start Rosuvastatin 10mg  daily and will call office with any new side effects.  Rx sent to [facility] in [place] per pt request.  Pt has no further questions at this time.

## 2020-11-04 ENCOUNTER — Ambulatory Visit: Payer: Medicare Other | Admitting: Pharmacist

## 2020-11-04 ENCOUNTER — Telehealth: Payer: Self-pay | Admitting: Family

## 2020-11-04 DIAGNOSIS — E782 Mixed hyperlipidemia: Secondary | ICD-10-CM

## 2020-11-04 DIAGNOSIS — I25118 Atherosclerotic heart disease of native coronary artery with other forms of angina pectoris: Secondary | ICD-10-CM

## 2020-11-04 DIAGNOSIS — J449 Chronic obstructive pulmonary disease, unspecified: Secondary | ICD-10-CM

## 2020-11-04 DIAGNOSIS — E785 Hyperlipidemia, unspecified: Secondary | ICD-10-CM

## 2020-11-04 DIAGNOSIS — I482 Chronic atrial fibrillation, unspecified: Secondary | ICD-10-CM

## 2020-11-04 DIAGNOSIS — I2510 Atherosclerotic heart disease of native coronary artery without angina pectoris: Secondary | ICD-10-CM

## 2020-11-04 DIAGNOSIS — I2583 Coronary atherosclerosis due to lipid rich plaque: Secondary | ICD-10-CM

## 2020-11-04 DIAGNOSIS — I10 Essential (primary) hypertension: Secondary | ICD-10-CM

## 2020-11-04 DIAGNOSIS — I5022 Chronic systolic (congestive) heart failure: Secondary | ICD-10-CM

## 2020-11-04 MED ORDER — LOSARTAN POTASSIUM 25 MG PO TABS
12.5000 mg | ORAL_TABLET | Freq: Every day | ORAL | 1 refills | Status: DC
Start: 2020-11-04 — End: 2020-12-01

## 2020-11-04 MED ORDER — APIXABAN 5 MG PO TABS: 5.0000 mg | ORAL_TABLET | Freq: Two times a day (BID) | ORAL | 5 refills | Status: DC

## 2020-11-04 MED ORDER — METOPROLOL TARTRATE 25 MG PO TABS: 50.0000 mg | ORAL_TABLET | Freq: Two times a day (BID) | ORAL | 5 refills | Status: DC

## 2020-11-04 MED ORDER — ROSUVASTATIN CALCIUM 10 MG PO TABS
10.0000 mg | ORAL_TABLET | Freq: Every day | ORAL | 5 refills | Status: DC
Start: 2020-11-04 — End: 2020-12-01

## 2020-11-04 NOTE — Telephone Encounter (Signed)
Received notification from Willa Frater, [facility] that Mr. Erik [name] will be receiving medication synchronization and packaging from [facility]. Sent electronic Rx for cardiac medications including Losartan, Metoprolol, Eliquis, and Rosuvastatin to [facility].   Alver Sorrow, NP

## 2020-11-04 NOTE — Patient Instructions (Addendum)
Visit Information  Goals Addressed            This Visit's Progress   . Track and Manage My Symptoms-COPD       Timeframe:  Long-Range Goal Priority:  High Start Date:    11/04/20                         Expected End Date: 05/07/21                      Follow Up Date 02/04/21   - begin a symptom diary - eliminate symptom triggers at home - follow rescue plan if symptoms flare-up - keep follow-up appointments    Why is this important?    Tracking your symptoms and other information about your health helps your doctor plan your care.   Write down the symptoms, the time of day, what you were doing and what medicine you are taking.   You will soon learn how to manage your symptoms.     Notes: Use maintenance inhaler daily!!      Patient Care Plan: General Pharmacy (Adult)    Problem Identified: Afib, HTN, COPD, HLD/CAD   Priority: High  Onset Date: 11/04/2020    Long-Range Goal: Patient-Specific Goal   Start Date: 11/04/2020  Expected End Date: 05/07/2021  This Visit's Progress: On track  Priority: High  Note:   Current Barriers:  . Unable to independently afford treatment regimen . Unable to achieve control of cholesterol  . Confusion of medication administration.   Pharmacist Clinical Goal(s):  [name] Patient will verbalize ability to afford treatment regimen . achieve control of cholesterol as evidenced by lipid panel . maintain control of blood pressure as evidenced by home monitoring  . achieve ability to self administer medications as prescribed through use of pill packs as evidenced by patient report through collaboration with PharmD and provider.   Interventions: . 1:1 collaboration with Lyndon Code, MD regarding development and update of comprehensive plan of care as evidenced by provider attestation and co-signature . Inter-disciplinary care team collaboration (see longitudinal plan of care) . Comprehensive medication review performed; medication list  updated in electronic medical record  Hypertension (BP goal <130/80) -Controlled -Current treatment: . Losartan 12.5mg  daily . Metoprolol 25mg  two tablets twice daily -Medications previously tried: Diltiazem (d/c)  -Current home readings: no specific readings available, patient has cuff at home and reports it has always been normal  -Current exercise habits: minimal -Denies hypotensive/hypertensive symptoms -Educated on BP goals and benefits of medications for prevention of heart attack, stroke and kidney damage; Importance of home blood pressure monitoring; Symptoms of hypotension and importance of maintaining adequate hydration; Use caution when going from sitting to standing -Counseled to monitor BP at home daily, document, and provide log at future appointments -Recommended to continue current medication Counseled on new medication starts and which medication to d/c   -Patient had not picked up losartan yet but was aware of change.  Hyperlipidemia/CAD: (LDL goal < 70) -Uncontrolled -Current treatment: . Rosuvastatin 10mg  daily -Medications previously tried: pravastatin   -Current exercise habits: minimal -Educated on Cholesterol goals;  Benefits of statin for ASCVD risk reduction; Importance of limiting foods high in cholesterol;  -Had not picked up newly prescribed statin - aware of change and plans to pick up. -Recommended to continue current medication Educated on benefits of taking at bedtime  Atrial Fibrillation (Goal: prevent stroke and major bleeding) -Controlled -CHADSVASC: 4 -Current  treatment: . Rate control: Metoprolol tartrate 25mg  two tablets daily . Anticoagulation: Eliquis 5mg  bid -Medications previously tried: Diltiazem CD 180 -Home BP and HR readings: no specifics available - patient reports always WNL  -Counseled on increased risk of stroke due to Afib and benefits of anticoagulation for stroke prevention; bleeding risk associated with Eliquis and  importance of self-monitoring for signs/symptoms of bleeding; Importance of taking as directed - avoidance of NSAIDs -Recommended to continue current medication Assessed patient finances. He reports copay is manageable currently, however with amount of brand name medications I fear donut hole is in the future.  Will look into PAP programs as he is only on SSI.  COPD (Goal: control symptoms and prevent exacerbations) -Controlled -Current treatment  . Duoneb 0.5-2.5 mg/91ml prn . Trelegy 100-62.5-25mcg.inh -Medications previously tried: none noted   -Exacerbations requiring treatment in last 6 months: none -Patient denies consistent use of maintenance inhaler -Frequency of rescue inhaler use: as needed -He reports sometimes he does not use his Trelegy daily, mainly due to cost concerns and trying to make it last longer.  Also cannot afford Daliresp due to copay.  He would really benefit from using maintenance inhaler daily.   -Counseled on Proper inhaler technique; Benefits of consistent maintenance inhaler use -Recommended to continue current medication Assessed patient finances. Will look into PAP programs for both Dailiresp and Trelegy.   Patient Goals/Self-Care Activities . Patient will:  - take medications as prescribed check blood pressure daily, document, and provide at future appointments collaborate with provider on medication access solutions  Follow Up Plan: The care management team will reach out to the patient again over the next 90 days.       [name] was given information about Chronic Care Management services today including:  1. CCM service includes personalized support from designated clinical staff supervised by his physician, including individualized plan of care and coordination with other care providers 2. 24/7 contact phone numbers for assistance for urgent and routine care needs. 3. Standard insurance, coinsurance, copays and deductibles apply for chronic  care management only during months in which we provide at least 20 minutes of these services. Most insurances cover these services at 100%, however patients may be responsible for any copay, coinsurance and/or deductible if applicable. This service may help you avoid the need for more expensive face-to-face services. 4. Only one practitioner may furnish and bill the service in a calendar month. 5. The patient may stop CCM services at any time (effective at the end of the month) by phone call to the office staff.  Patient agreed to services and verbal consent obtained.   The patient verbalized understanding of instructions, educational materials, and care plan provided today and agreed to receive a mailed copy of patient instructions, educational materials, and care plan.  Telephone follow up appointment with pharmacy team member scheduled for: 3 months  09-06-1987, [facility]

## 2020-11-05 ENCOUNTER — Other Ambulatory Visit: Payer: Self-pay

## 2020-11-05 ENCOUNTER — Other Ambulatory Visit: Payer: Self-pay | Admitting: Hospice and Palliative Medicine

## 2020-11-05 ENCOUNTER — Telehealth: Payer: Self-pay

## 2020-11-05 DIAGNOSIS — J449 Chronic obstructive pulmonary disease, unspecified: Secondary | ICD-10-CM | POA: Diagnosis not present

## 2020-11-05 DIAGNOSIS — I10 Essential (primary) hypertension: Secondary | ICD-10-CM | POA: Diagnosis not present

## 2020-11-05 MED ORDER — TRELEGY ELLIPTA 100-62.5-25 MCG/INH IN AEPB: 1.0000 | INHALATION_SPRAY | Freq: Every day | RESPIRATORY_TRACT | 3 refills | Status: DC

## 2020-11-05 MED ORDER — LORATADINE 10 MG PO TABS: 10.0000 mg | ORAL_TABLET | Freq: Every day | ORAL | 1 refills | Status: DC

## 2020-11-05 MED ORDER — IPRATROPIUM-ALBUTEROL 0.5-2.5 (3) MG/3ML IN SOLN: RESPIRATORY_TRACT | 6 refills | Status: DC

## 2020-11-05 NOTE — Telephone Encounter (Signed)
I Received message from Erik [name] ,[facility] that Erik [name]  will receiving Medication Synchronization and packaging from [facility] so I Electronic send RX Trelegy,Claritin and Duoneb

## 2020-11-06 ENCOUNTER — Telehealth: Payer: Self-pay | Admitting: Pharmacist

## 2020-11-06 DIAGNOSIS — M6281 Muscle weakness (generalized): Secondary | ICD-10-CM | POA: Diagnosis not present

## 2020-11-06 DIAGNOSIS — J449 Chronic obstructive pulmonary disease, unspecified: Secondary | ICD-10-CM | POA: Diagnosis not present

## 2020-11-06 DIAGNOSIS — I4891 Unspecified atrial fibrillation: Secondary | ICD-10-CM | POA: Diagnosis not present

## 2020-11-06 DIAGNOSIS — L97921 Non-pressure chronic ulcer of unspecified part of left lower leg limited to breakdown of skin: Secondary | ICD-10-CM | POA: Diagnosis not present

## 2020-11-06 DIAGNOSIS — I509 Heart failure, unspecified: Secondary | ICD-10-CM | POA: Diagnosis not present

## 2020-11-06 DIAGNOSIS — I110 Hypertensive heart disease with heart failure: Secondary | ICD-10-CM | POA: Diagnosis not present

## 2020-11-06 DIAGNOSIS — Z87891 Personal history of nicotine dependence: Secondary | ICD-10-CM | POA: Diagnosis not present

## 2020-11-06 DIAGNOSIS — I2510 Atherosclerotic heart disease of native coronary artery without angina pectoris: Secondary | ICD-10-CM | POA: Diagnosis not present

## 2020-11-06 DIAGNOSIS — Z4800 Encounter for change or removal of nonsurgical wound dressing: Secondary | ICD-10-CM | POA: Diagnosis not present

## 2020-11-06 DIAGNOSIS — I872 Venous insufficiency (chronic) (peripheral): Secondary | ICD-10-CM | POA: Diagnosis not present

## 2020-11-06 NOTE — Progress Notes (Addendum)
    Chronic Care Management Pharmacy Assistant   Name: Erik [name]  MRN: [id number] DOB: Dec 06, 1955  Reason for Encounter: PAP  Medications: Outpatient Encounter Medications as of 11/06/2020  Medication Sig   apixaban (ELIQUIS) 5 MG TABS tablet Take 1 tablet (5 mg total) by mouth 2 (two) times daily.   gabapentin (NEURONTIN) 100 MG capsule Take 1 capsule (100 mg total) by mouth 3 (three) times daily.   Humidifier MISC Use with O2 at all times   ipratropium-albuterol (DUONEB) 0.5-2.5 (3) MG/3ML SOLN USE 1 VIAL VIA NEBULIZER EVERY 6 HOURS   loratadine (CLARITIN) 10 MG tablet Take 1 tablet (10 mg total) by mouth daily.   losartan (COZAAR) 25 MG tablet Take 0.5 tablets (12.5 mg total) by mouth daily.   metoprolol tartrate (LOPRESSOR) 25 MG tablet Take 2 tablets (50 mg total) by mouth 2 (two) times daily.   OXYGEN Inhale 2-3 L into the lungs daily.   Roflumilast (DALIRESP) 250 MCG TABS Take one tablet by mouth daily   rosuvastatin (CRESTOR) 10 MG tablet Take 1 tablet (10 mg total) by mouth daily.   torsemide (DEMADEX) 10 MG tablet Take 10 mg by mouth daily.   TRELEGY ELLIPTA 100-62.5-25 MCG/INH AEPB Inhale 1 puff into the lungs daily.   No facility-administered encounter medications on file as of 11/06/2020.   New patient assistance application form filled out to [employer], [employer], [place] [employer] and  for Trelegy Ellipta, Eliquis,and Roflumilast. Waiting for patient and provider to complete and sign documentation. Called patient to inquire if they wanted the application mailed to them or if they wanted to come into the office. Patient is aware he is required to sign application and to bring/have proof of income. He stated he would be willing to bring the needed information into the office to bring proof of income and sign application once he receives all forms in the mail. He would like it mailed to their residence address [address] [place] [address]    Follow-Up:Pharmacist  Review  Hulen Luster, RMA Clinical Pharmacist Assistant [phone]   3 minutes spent in review, coordination, and documentation.  Reviewed by: Willa Frater, PharmD Clinical Pharmacist [facility] [phone]

## 2020-11-09 ENCOUNTER — Encounter (INDEPENDENT_AMBULATORY_CARE_PROVIDER_SITE_OTHER): Payer: Self-pay | Admitting: Nurse Practitioner

## 2020-11-09 MED ORDER — COLLAGENASE 250 UNIT/GM EX OINT: TOPICAL_OINTMENT | CUTANEOUS | 3 refills | Status: DC

## 2020-11-09 MED ORDER — TORSEMIDE 10 MG PO TABS: 10.0000 mg | ORAL_TABLET | Freq: Every day | ORAL | 5 refills | Status: DC

## 2020-11-09 NOTE — Progress Notes (Signed)
Subjective:    Patient ID: Erik [name], male    DOB: [birth year], 65 y.o.   MRN: [id number] Chief Complaint  Patient presents with  . Follow-up    4wk left unna wrap follow up    The patient presents today for evaluation of the ulceration on his left posterior calf.  The patient's wound has continued to decrease in size but the progression has slowed.  Wound bed is also covered with slough.  The patient swelling continues to look better than it has previously.  He denies any fever or chills.  He denies any chest pain or shortness of breath.     Review of Systems  Cardiovascular: Positive for leg swelling.  Skin: Positive for wound.  All other systems reviewed and are negative.      Objective:   Physical Exam Vitals reviewed.  HENT:     Head: Normocephalic.  Cardiovascular:     Rate and Rhythm: Normal rate.     Pulses: Normal pulses.  Pulmonary:     Effort: Pulmonary effort is normal.  Skin:    General: Skin is warm and dry.  Neurological:     Mental Status: He is alert and oriented to person, place, and time.     Motor: Weakness present.     Gait: Gait abnormal.  Psychiatric:        Mood and Affect: Mood normal.        Behavior: Behavior normal.        Thought Content: Thought content normal.        Judgment: Judgment normal.     BP (!) 138/92 (BP Location: Left Arm)   Pulse 69   Resp 16   Wt 227 lb (103 kg)   BMI 32.57 kg/m   Past Medical History:  Diagnosis Date  . Arthritis   . Asthma   . Atrial fibrillation (HCC)   . CHF (congestive heart failure) (HCC)   . COPD (chronic obstructive pulmonary disease) (HCC)   . Coronary artery disease   . GERD (gastroesophageal reflux disease)   . Hypertension   . Myocardial infarction (HCC)   . Shortness of breath dyspnea     Social History   Socioeconomic History  . Marital status: Married    Spouse name: Not on file  . Number of children: Not on file  . Years of education: Not on file  . Highest  education level: Not on file  Occupational History  . Not on file  Tobacco Use  . Smoking status: Former Smoker    Years: 25.00    Quit date: 03/20/1997    Years since quitting: 23.6  . Smokeless tobacco: Never Used  Vaping Use  . Vaping Use: Never used  Substance and Sexual Activity  . Alcohol use: No  . Drug use: No  . Sexual activity: Not on file  Other Topics Concern  . Not on file  Social History Narrative  . Not on file   Social Determinants of Health   Financial Resource Strain: Low Risk   . Difficulty of Paying Living Expenses: Not hard at all  Food Insecurity: Not on file  Transportation Needs: Not on file  Physical Activity: Not on file  Stress: Not on file  Social Connections: Not on file  Intimate Partner Violence: Not on file    Past Surgical History:  Procedure Laterality Date  . ANGIOPLASTY  2009  . CARDIAC CATHETERIZATION N/A 03/18/2015   Procedure: Left Heart Cath with Coronary  Angiography;  Surgeon: Lamar Blinks, MD;  Location: [facility] INVASIVE CV LAB;  Service: Cardiovascular;  Laterality: N/A;  . CHOLECYSTECTOMY  2005  . CORONARY ANGIOGRAM  2009    Family History  Problem Relation Age of Onset  . Coronary artery disease Father   . Diabetes Father   . Hyperlipidemia Father   . Hypertension Father   . Stroke Mother   . Hyperlipidemia Mother     Allergies  Allergen Reactions  . Benadryl [Diphenhydramine Hcl] Shortness Of Breath  . Morphine And Related     CBC Latest Ref Rng & Units 11/02/2020 02/19/2020 08/14/2017  WBC 4.0 - 10.5 K/uL 8.5 8.5 8.3  Hemoglobin 13.0 - 17.0 g/dL 47.4 25.9 56.3  Hematocrit 39.0 - 52.0 % 45.8 45.6 50.3  Platelets 150 - 400 K/uL 350 345 329      CMP     Component Value Date/Time   NA 137 11/02/2020 1649   NA 138 02/19/2020 0000   NA 135 (L) 07/19/2013 0417   K 4.4 11/02/2020 1649   K 3.9 07/19/2013 0417   CL 102 11/02/2020 1649   CL 101 07/19/2013 0417   CO2 27 11/02/2020 1649   CO2 29 07/19/2013 0417    GLUCOSE 101 (H) 11/02/2020 1649   GLUCOSE 86 07/19/2013 0417   BUN 13 11/02/2020 1649   BUN 14 02/19/2020 0000   BUN 10 07/19/2013 0417   CREATININE 0.80 11/02/2020 1649   CREATININE 0.80 07/19/2013 0417   CALCIUM 9.1 11/02/2020 1649   CALCIUM 8.4 (L) 07/19/2013 0417   PROT 6.3 02/19/2020 0000   PROT 6.6 07/19/2013 0417   ALBUMIN 4.2 02/19/2020 0000   ALBUMIN 2.8 (L) 07/19/2013 0417   AST 14 02/19/2020 0000   AST 33 07/19/2013 0417   ALT 12 02/19/2020 0000   ALT 32 07/19/2013 0417   ALKPHOS 81 02/19/2020 0000   ALKPHOS 85 07/19/2013 0417   BILITOT 0.4 02/19/2020 0000   BILITOT 1.6 (H) 07/19/2013 0417   GFRNONAA >60 11/02/2020 1649   GFRNONAA >60 07/19/2013 0417   GFRAA 105 02/19/2020 0000   GFRAA >60 07/19/2013 0417     No results found.     Assessment & Plan:   1. Venous ulcer (HCC) The patient's wound continues to get slightly smaller.  The patient does have some slough present in the wound.  We will try to debride the wound to help with wound healing by sending in Santyl ointment to be changed with Unna wrap changes.  Otherwise we will have the patient return to the office in 4 weeks for reevaluation of his lower extremity wounds. - collagenase (SANTYL) ointment; Apply 1 application with unna wrap changes  Dispense: 30 g; Refill: 3  2. Combined hyperlipidemia Continue statin as ordered and reviewed, no changes at this time   3. Essential hypertension with goal blood pressure less than 130/80 Continue antihypertensive medications as already ordered, these medications have been reviewed and there are no changes at this time.    Current Outpatient Medications on File Prior to Visit  Medication Sig Dispense Refill  . gabapentin (NEURONTIN) 100 MG capsule Take 1 capsule (100 mg total) by mouth 3 (three) times daily. 90 capsule 3  . Humidifier MISC Use with O2 at all times 1 each 0  . OXYGEN Inhale 2-3 L into the lungs daily.    . Roflumilast (DALIRESP) 250 MCG TABS  Take one tablet by mouth daily 28 tablet 3   No current facility-administered medications on  file prior to visit.    There are no Patient Instructions on file for this visit. No follow-ups on file.   Kris Hartmann, NP

## 2020-11-09 NOTE — Telephone Encounter (Signed)
Sent Rx for Torsemide 10mg  PO QD to [facility] for medication synchronization and packaging.   , NP

## 2020-11-09 NOTE — Addendum Note (Signed)
Addended by: Alver Sorrow on: 11/09/2020 07:43 AM   Modules accepted: Orders

## 2020-11-10 DIAGNOSIS — Z87891 Personal history of nicotine dependence: Secondary | ICD-10-CM | POA: Diagnosis not present

## 2020-11-10 DIAGNOSIS — Z4800 Encounter for change or removal of nonsurgical wound dressing: Secondary | ICD-10-CM | POA: Diagnosis not present

## 2020-11-10 DIAGNOSIS — I872 Venous insufficiency (chronic) (peripheral): Secondary | ICD-10-CM | POA: Diagnosis not present

## 2020-11-10 DIAGNOSIS — I509 Heart failure, unspecified: Secondary | ICD-10-CM | POA: Diagnosis not present

## 2020-11-10 DIAGNOSIS — J449 Chronic obstructive pulmonary disease, unspecified: Secondary | ICD-10-CM | POA: Diagnosis not present

## 2020-11-10 DIAGNOSIS — I110 Hypertensive heart disease with heart failure: Secondary | ICD-10-CM | POA: Diagnosis not present

## 2020-11-10 DIAGNOSIS — L97921 Non-pressure chronic ulcer of unspecified part of left lower leg limited to breakdown of skin: Secondary | ICD-10-CM | POA: Diagnosis not present

## 2020-11-10 DIAGNOSIS — I2510 Atherosclerotic heart disease of native coronary artery without angina pectoris: Secondary | ICD-10-CM | POA: Diagnosis not present

## 2020-11-10 DIAGNOSIS — I4891 Unspecified atrial fibrillation: Secondary | ICD-10-CM | POA: Diagnosis not present

## 2020-11-10 DIAGNOSIS — M6281 Muscle weakness (generalized): Secondary | ICD-10-CM | POA: Diagnosis not present

## 2020-11-15 DIAGNOSIS — R0902 Hypoxemia: Secondary | ICD-10-CM | POA: Diagnosis not present

## 2020-11-15 DIAGNOSIS — J449 Chronic obstructive pulmonary disease, unspecified: Secondary | ICD-10-CM | POA: Diagnosis not present

## 2020-11-16 ENCOUNTER — Encounter: Payer: Self-pay | Admitting: Family

## 2020-11-16 ENCOUNTER — Other Ambulatory Visit: Payer: Self-pay

## 2020-11-16 ENCOUNTER — Ambulatory Visit: Payer: Medicare Other | Admitting: Family

## 2020-11-16 VITALS — BP 120/80 | HR 72 | Ht 70.0 in | Wt 212.0 lb

## 2020-11-16 DIAGNOSIS — I25118 Atherosclerotic heart disease of native coronary artery with other forms of angina pectoris: Secondary | ICD-10-CM | POA: Diagnosis not present

## 2020-11-16 DIAGNOSIS — J441 Chronic obstructive pulmonary disease with (acute) exacerbation: Secondary | ICD-10-CM | POA: Diagnosis not present

## 2020-11-16 DIAGNOSIS — I83009 Varicose veins of unspecified lower extremity with ulcer of unspecified site: Secondary | ICD-10-CM

## 2020-11-16 DIAGNOSIS — J449 Chronic obstructive pulmonary disease, unspecified: Secondary | ICD-10-CM

## 2020-11-16 DIAGNOSIS — L97909 Non-pressure chronic ulcer of unspecified part of unspecified lower leg with unspecified severity: Secondary | ICD-10-CM | POA: Diagnosis not present

## 2020-11-16 DIAGNOSIS — I5022 Chronic systolic (congestive) heart failure: Secondary | ICD-10-CM | POA: Diagnosis not present

## 2020-11-16 DIAGNOSIS — J439 Emphysema, unspecified: Secondary | ICD-10-CM | POA: Diagnosis not present

## 2020-11-16 DIAGNOSIS — Z7901 Long term (current) use of anticoagulants: Secondary | ICD-10-CM | POA: Diagnosis not present

## 2020-11-16 DIAGNOSIS — E785 Hyperlipidemia, unspecified: Secondary | ICD-10-CM | POA: Diagnosis not present

## 2020-11-16 DIAGNOSIS — I482 Chronic atrial fibrillation, unspecified: Secondary | ICD-10-CM | POA: Diagnosis not present

## 2020-11-16 MED ORDER — SPIRONOLACTONE 25 MG PO TABS: 12.5000 mg | ORAL_TABLET | Freq: Every day | ORAL | 2 refills | Status: DC

## 2020-11-16 NOTE — Progress Notes (Signed)
Office Visit    Patient Name: Erik [name] Date of Encounter: 11/16/2020  PCP:  Lyndon Code, MD   [facility]  Cardiologist:  Lorine Bears, MD  Advanced Practice Provider:  No care team member to display Electrophysiologist:  None   Chief Complaint    Erik [name] is a 65 y.o. male with a hx of coronary artery disease, atrial fibrillation, chronic anticoagulation, HFrEF, COPD on home O2, arthritis, hyperlipidemia presents today for follow-up after addition of metoprolol and echocardiogram  Past Medical History    Past Medical History:  Diagnosis Date  . Arthritis   . Asthma   . Atrial fibrillation (HCC)   . CHF (congestive heart failure) (HCC)   . COPD (chronic obstructive pulmonary disease) (HCC)   . Coronary artery disease   . GERD (gastroesophageal reflux disease)   . Hypertension   . Myocardial infarction (HCC)   . Shortness of breath dyspnea    Past Surgical History:  Procedure Laterality Date  . ANGIOPLASTY  2009  . CARDIAC CATHETERIZATION N/A 03/18/2015   Procedure: Left Heart Cath with Coronary Angiography;  Surgeon: Lamar Blinks, MD;  Location: [facility] INVASIVE CV LAB;  Service: Cardiovascular;  Laterality: N/A;  . CHOLECYSTECTOMY  2005  . CORONARY ANGIOGRAM  2009    Allergies  Allergies  Allergen Reactions  . Benadryl [Diphenhydramine Hcl] Shortness Of Breath  . Morphine And Related     History of Present Illness    Erik [name] is a 65 y.o. male with a hx of coronary artery disease, atrial fibrillation, chronic anticoagulation, HFrEF, COPD on home O2, arthritis, hyperlipidemia last seen 09/29/20 by Dr. Kirke Corin.  Coronary artery disease s/p bare-metal stent2009.  Cardiac catheterization 2016 with patent onto stent with mild in-stent stenosis, chronically occluded proximal RCA with good collateral, moderate mid LAD stenosis and mild LCx disease.  He was previously followed by Dr. Gwen Pounds of [facility] cardiology.  He was hospitalized in 2014 with mild acute pancreatitis and anticoagulation with Pradaxa was discontinued at that time due to compliance issue per documentation. No bleeding event noted.   Previous echocardiogram 08/14/17 EF 55-60% ? 03/13/20 EF 40% (performed at outside facility).  Seen by Dr. Kirke Corin 09/29/2020 as a new patient noting worsening exertional dyspnea as well as intermittent tachycardia since an upper respiratory infection in December. He also noted significant bilateral lower extremity edema though no weight gain.  Ventricular rate was not well controlled with EKG showing atrial fibrillation 111 BPM.  Metoprolol 25 mg twice daily was added with plan to optimize HF medications and potentially transition to Toprol and consideration of cardiac cath at follow up.   He was seen in follow-up 11/02/2020.  Echocardiogram was reviewed results indication for cardiac catheterization and he was hesitant at that time.  He noted previous intolerance to pravastatin but was agreeable to try rosuvastatin.  His diltiazem was discontinued due to reduced EF and he was started on losartan as well as metoprolol.  He presents today for follow-up.  When asked he is still not interested in cardiac catheterization despite discussion regarding the indication in setting of reduced LVEF.  He tells me he previously was told that there was "nothing they could do "about the stent we discussed that there could be a new plaque buildup or blockage.  He continues to decline cardiac catheterization.  His weight is down 15 pounds since clinic visit 2 weeks ago.  Reports his blood pressure and heart rate have  been well controlled at home.  Denies chest pain, pressure, tightness.  Reports stable dyspnea on exertion.  Does note he has been waking up in the evening with headaches which she thinks is due to pollen and is relieved by Tylenol.  He has also been taking Claritin intermittently with some relief.  Tells me his legs have stopped  swelling since discontinuing diltiazem.  Reports bruising on Eliquis but no hematuria nor melena.  Tells me he has been having "milligrams" for the last 2 weeks on and off and was encouraged to follow-up with his primary care provider.  We discussed his Crestor nor Eliquis.  EKGs/Labs/Other Studies Reviewed:   The following studies were reviewed today:  Echo 09/30/20 1. Left ventricular ejection fraction, by estimation, is 40 to 45%. The  left ventricle has mild to moderately decreased function. Left ventricular  endocardial border not optimally defined to evaluate regional wall motion.  Left ventricular diastolic  parameters are indeterminate.   2. Right ventricular systolic function is low normal. The right  ventricular size is normal.   3. Left atrial size was severely dilated.   4. Right atrial size was moderately dilated.   5. The mitral valve is degenerative. Mild mitral valve regurgitation.   6. The aortic valve was not well visualized. Aortic valve regurgitation  is not visualized. Mild aortic valve sclerosis is present, with no  evidence of aortic valve stenosis.   7. The inferior vena cava is normal in size with greater than 50%  respiratory variability, suggesting right atrial pressure of 3 mmHg   LHC 03/2015  Prox RCA lesion, 100% stenosed.  Mid LAD lesion, 40% stenosed.  Mid Cx to Dist Cx lesion, 30% stenosed.   Assessment The patient has had progressive canadian class 3 anginal symptoms with a high probability stress test with risk factors including high blood pressure and high cholesterol.   abnormal left ventricular function with ejection fraction of 35%   severe 1 vessel coronary artery disease    There is significant stenosis of right coronary artery   Plan Continue medical management of CAD risk factors, Additional medications for management of angina and No further cardiac intervention at this time  EKG:  NoEKG is ordered today.  The ekg independently  reviewed from 11/02/2020 demonstrated rate controlled atrial fibrillation 84bpm  Recent Labs: 02/19/2020: ALT 12; TSH 1.380 11/02/2020: BUN 13; Creatinine, Ser 0.80; Hemoglobin 15.2; Platelets 350; Potassium 4.4; Sodium 137  Recent Lipid Panel    Component Value Date/Time   CHOL 223 (H) 11/02/2020 1649   CHOL 184 02/19/2020 0000   CHOL 146 07/18/2013 0934   TRIG 83 11/02/2020 1649   TRIG 53 07/18/2013 0934   HDL 57 11/02/2020 1649   HDL 49 02/19/2020 0000   HDL 48 07/18/2013 0934   CHOLHDL 3.9 11/02/2020 1649   VLDL 17 11/02/2020 1649   VLDL 11 07/18/2013 0934   LDLCALC 149 (H) 11/02/2020 1649   LDLCALC 115 (H) 02/19/2020 0000   LDLCALC 87 07/18/2013 0934   LDLDIRECT 140.3 (H) 11/02/2020 1649    Risk Assessment/Calculations:   CHA2DS2-VASc Score = 3  This indicates a 3.2% annual risk of stroke. The patient's score is based upon: CHF History: Yes HTN History: No Diabetes History: No Stroke History: No Vascular Disease History: Yes Age Score: 1 Gender Score: 0  Home Medications   Current Meds  Medication Sig  . apixaban (ELIQUIS) 5 MG TABS tablet Take 1 tablet (5 mg total) by mouth 2 (two) times  daily.  . collagenase (SANTYL) ointment Apply 1 application with unna wrap changes  . gabapentin (NEURONTIN) 100 MG capsule Take 1 capsule (100 mg total) by mouth 3 (three) times daily.  . Humidifier MISC Use with O2 at all times  . ipratropium-albuterol (DUONEB) 0.5-2.5 (3) MG/3ML SOLN USE 1 VIAL VIA NEBULIZER EVERY 6 HOURS  . loratadine (CLARITIN) 10 MG tablet Take 1 tablet (10 mg total) by mouth daily.  [name]. losartan (COZAAR) 25 MG tablet Take 0.5 tablets (12.5 mg total) by mouth daily.  . metoprolol tartrate (LOPRESSOR) 25 MG tablet Take 2 tablets (50 mg total) by mouth 2 (two) times daily.  . OXYGEN Inhale 2-3 L into the lungs daily.  . Roflumilast (DALIRESP) 250 MCG TABS Take one tablet by mouth daily  . rosuvastatin (CRESTOR) 10 MG tablet Take 1 tablet (10 mg total) by mouth  daily.  [name]. torsemide (DEMADEX) 10 MG tablet Take 1 tablet (10 mg total) by mouth daily.  . TRELEGY ELLIPTA 100-62.5-25 MCG/INH AEPB Inhale 1 puff into the lungs daily.     Review of Systems  All other systems reviewed and are otherwise negative except as noted above.  Physical Exam    VS:  BP 120/80 (BP Location: Right Arm, Patient Position: Sitting, Cuff Size: Large)   Pulse 72   Ht 5\' 10"  (1.778 m)   Wt 212 lb (96.2 kg)   SpO2 95%   BMI 30.42 kg/m  , BMI Body mass index is 30.42 kg/m.  Wt Readings from Last 3 Encounters:  11/16/20 212 lb (96.2 kg)  11/03/20 227 lb (103 kg)  11/02/20 227 lb (103 kg)   GEN: Well nourished, well developed, in no acute distress. HEENT: normal. Neck: Supple, no JVD, carotid bruits, or masses. Cardiac: Irregularly irregular, no murmurs, rubs, or gallops. No clubbing, cyanosis, edema.  Radials 2+ and equal bilaterally.  Respiratory:  Respirations regular and unlabored, clear to auscultation bilaterally. On 2L O2. GI: Soft, nontender, nondistended. MS: No deformity or atrophy. Skin: Warm and dry, no rash. LLE in [employer] LockerUnna boot.  Neuro:  Strength and sensation are intact. Psych: Normal affect.  Assessment & Plan    1. HFrEF - Echo 03/18/20 with newly reduced LVEF 40%. Not seen in cardiology follow up until 09/2020 with echo 09/30/20 LVEF 40-45%. NYHA II-III with dyspnea, orthopnea.   GDMT includes metoprolol tartrate 50 mg twice daily, losartan 12.5 mg daily, torsemide 10 mg daily.  Start spironolactone 12.5 mg daily.  BMP in 1 week.  Future considerations include transition to [facility] or addition of [place]   Discussed indication for ischemic evaluation in the setting of newly reduced LVEF. We discussed cardiac catheterization and that risks include but are not limited to stroke (1 in 1000), death (1 in 1000), kidney failure [usually temporary] (1 in 500), bleeding (1 in 200), allergic reaction [possibly serious] (1 in 200). He continues to politely  decline as he has been told in the past that there was "nothing they could do".  Discussed that there could be a new blockage but he is still unwilling to proceed with cardiac catheterization.  Declines cardiac rehab referral.  2. Chronic atrial fibrillation / chronic anticoagulation -rate controlled on present dose of metoprolol.  Reports tolerating Eliquis 5mg  BID. CHA2DS2-VASc Score = 3 [CHF History: Yes, HTN History: No, Diabetes History: No, Stroke History: No, Vascular Disease History: Yes, Age Score: 1, Gender Score: 0].  Therefore, the patient's annual risk of stroke is 3.2 %.  CBC today for monitoring.  3. CAD -reports no chest pain, pressure, tightness.  Again discussed indication for ischemic evaluation in setting of newly reduced LVEF, as above. GDMT includes beta blocker, crestor  4. Venous ulcer - Follows with [facility]. LLE with unna boot in place.  Edema improved since discontinuation of diltiazem.  5. COPD - Continue to follow with pulmonology. No signs of acute exacerbation.   6. HLD, LDL goal <70 -11/02/2020 direct LDL 140.  Started on Crestor 10 mg daily.  Plan for repeat lipid panel at follow-up.    Disposition: Follow up in 1 month(s) with Dr. Kirke Corin or APP.   Signed, Alver Sorrow, NP 11/16/2020, 2:49 PM [facility]

## 2020-11-16 NOTE — Patient Instructions (Addendum)
Medication Instructions:  Your physician has recommended you make the following change in your medication:  START Spironolactone 12.5mg  (half tablet) daily  *This is to help strengthen you heart pumping function*  *If you need a refill on your cardiac medications before your next appointment, please call your pharmacy*  Lab Work: Your provider recommends that you return for lab work in 1 week at [employer] for [facility], BNP  Medical Mall Entrance at [facility] 1st desk on the right to check in, past the screening table Lab hours: Monday- Friday (7:30 am- 5:30 pm)   Testing/Procedures: Your physician has requested that you have a cardiac catheterization. Cardiac catheterization is used to diagnose and/or treat various heart conditions.This procedure is also used to evaluate the valves, as well as measure the blood flow and oxygen levels in different parts of your heart.  If you decide you would like to have the cardiac catheterization, please call us to let us know.   Follow-Up: At [facility], you and your health needs are our priority.  As part of our continuing mission to provide you with exceptional heart care, we have created designated Provider Care Teams.  These Care Teams include your primary Cardiologist (physician) and Advanced Practice Providers (APPs -  Physician Assistants and Nurse Practitioners) who all work together to provide you with the care you need, when you need it.  We recommend signing up for the patient portal called "MyChart".  Sign up information is provided on this After Visit Summary.  MyChart is used to connect with patients for Virtual Visits (Telemedicine).  Patients are able to view lab/test results, encounter notes, upcoming appointments, etc.  Non-urgent messages can be sent to your provider as well.   To learn more about what you can do with MyChart, go to [email or web address].    Your next appointment:   1 month(s)  The format for your next  appointment:   In Person  Provider:   You may see Lorine Bears, MD or one of the following Advanced Practice Providers on your designated Care Team:    Nicolasa Ducking, NP  Eula Listen, PA-C  Marisue Ivan, PA-C  Cadence Fransico Michael, [place]  Gillian Shields, NP  Other Instructions  Heart Healthy Diet Recommendations: A low-salt diet is recommended. Meats should be grilled, baked, or boiled. Avoid fried foods. Focus on lean protein sources like fish or chicken with vegetables and fruits. The American Heart Association is a Chief Technology Officer!  American Heart Association Diet and Lifeystyle Recommendations   Exercise recommendations: The American Heart Association recommends 150 minutes of moderate intensity exercise weekly. Try 30 minutes of moderate intensity exercise 4-5 times per week. This could include walking, jogging, or swimming.  Recommend weighing daily and keeping a log. Please call our office if you have weight gain of 2 pounds overnight or 5 pounds in 1 week.   Date  Time Weight

## 2020-11-20 DIAGNOSIS — L97921 Non-pressure chronic ulcer of unspecified part of left lower leg limited to breakdown of skin: Secondary | ICD-10-CM | POA: Diagnosis not present

## 2020-11-20 DIAGNOSIS — I2510 Atherosclerotic heart disease of native coronary artery without angina pectoris: Secondary | ICD-10-CM | POA: Diagnosis not present

## 2020-11-20 DIAGNOSIS — I509 Heart failure, unspecified: Secondary | ICD-10-CM | POA: Diagnosis not present

## 2020-11-20 DIAGNOSIS — J449 Chronic obstructive pulmonary disease, unspecified: Secondary | ICD-10-CM | POA: Diagnosis not present

## 2020-11-20 DIAGNOSIS — Z4800 Encounter for change or removal of nonsurgical wound dressing: Secondary | ICD-10-CM | POA: Diagnosis not present

## 2020-11-20 DIAGNOSIS — I4891 Unspecified atrial fibrillation: Secondary | ICD-10-CM | POA: Diagnosis not present

## 2020-11-20 DIAGNOSIS — M6281 Muscle weakness (generalized): Secondary | ICD-10-CM | POA: Diagnosis not present

## 2020-11-20 DIAGNOSIS — I872 Venous insufficiency (chronic) (peripheral): Secondary | ICD-10-CM | POA: Diagnosis not present

## 2020-11-20 DIAGNOSIS — I110 Hypertensive heart disease with heart failure: Secondary | ICD-10-CM | POA: Diagnosis not present

## 2020-11-20 DIAGNOSIS — Z87891 Personal history of nicotine dependence: Secondary | ICD-10-CM | POA: Diagnosis not present

## 2020-11-23 ENCOUNTER — Telehealth (INDEPENDENT_AMBULATORY_CARE_PROVIDER_SITE_OTHER): Payer: Self-pay

## 2020-11-23 NOTE — Telephone Encounter (Signed)
Home care nurse called and left a VM on the nurses line about the  The pt's wound care orders for Santyl. I called in the Rx to the pt's pharmacy and made the pt and the nurse aware.

## 2020-11-25 ENCOUNTER — Telehealth: Payer: Self-pay | Admitting: Pharmacist

## 2020-11-25 NOTE — Progress Notes (Addendum)
Chronic Care Management Pharmacy Assistant   Name: Erik [name]  MRN: [id number] DOB: [birth year]  Reason for Encounter: Medication Review/Medication Coordination Call  Medications: Outpatient Encounter Medications as of 11/25/2020  Medication Sig   apixaban (ELIQUIS) 5 MG TABS tablet Take 1 tablet (5 mg total) by mouth 2 (two) times daily.   collagenase (SANTYL) ointment Apply 1 application with unna wrap changes   gabapentin (NEURONTIN) 100 MG capsule Take 1 capsule (100 mg total) by mouth 3 (three) times daily.   Humidifier MISC Use with O2 at all times   ipratropium-albuterol (DUONEB) 0.5-2.5 (3) MG/3ML SOLN USE 1 VIAL VIA NEBULIZER EVERY 6 HOURS   loratadine (CLARITIN) 10 MG tablet Take 1 tablet (10 mg total) by mouth daily.   losartan (COZAAR) 25 MG tablet Take 0.5 tablets (12.5 mg total) by mouth daily.   metoprolol tartrate (LOPRESSOR) 25 MG tablet Take 2 tablets (50 mg total) by mouth 2 (two) times daily.   OXYGEN Inhale 2-3 L into the lungs daily.   Roflumilast (DALIRESP) 250 MCG TABS Take one tablet by mouth daily   rosuvastatin (CRESTOR) 10 MG tablet Take 1 tablet (10 mg total) by mouth daily.   spironolactone (ALDACTONE) 25 MG tablet Take 0.5 tablets (12.5 mg total) by mouth daily.   torsemide (DEMADEX) 10 MG tablet Take 1 tablet (10 mg total) by mouth daily.   TRELEGY ELLIPTA 100-62.5-25 MCG/INH AEPB Inhale 1 puff into the lungs daily.   No facility-administered encounter medications on file as of 11/25/2020.   Reviewed chart for medication changes ahead of medication coordination call.  No OVs  or hospital visits since last Pharmacist visit.  Consult Visits: 11/16/20 Cardiology Alver Sorrow, NP. For follow-up. STARTED Spironolactone 12.5 mg daily. (Patient stated he does not want to take this at this time)  BP Readings from Last 3 Encounters:  11/16/20 120/80  11/03/20 (!) 138/92  11/02/20 120/72    Lab Results  Component Value Date   HGBA1C 6.3  07/19/2013     Patient obtains medications through Vials  30 Days   Last adherence delivery included: Rosuvastatin 10 mg  Loratadine 10 mg  Trelegy Aer 100 mg  Patient declined meds last month: Eliquis 5 mg ( Patient does not want to take) Spironolactone 25 mg (Patient does not want to take this) Ipratropium 0.5 mg  Loratadine 10 mg  Torsemide 10 mg  Metoprolol Tar 25 mg   Patient is due for next adherence delivery on: 12/04/20.  Called patient and reviewed medications and coordinated delivery.  This delivery to include: Losartan 25 mg 1 tablet daily  Metoprolol Tar 25 mg 2 tablets for breakfast and evening  Rosuvastatin 10 mg 1 tablet daily  Torsemide 10 mg 1 tablet daily  Loratadine 10 mg  1 tablet daily   Patient declined the following medications: Ipratropium 0.5 mg (enough on hand) Trelegy Aer 100 mg (enough on hand) Eliquis 5 mg ( Patient does not want to take) Spironolactone 25 mg (Patient does not want to take this right now) Aspirin 81 mg 1 tablet daily (OTC)  Patient does not need refills at this time.   Confirmed delivery date of 12/04/20, advised patient that pharmacy will contact them the morning of delivery.     Follow-Up:Pharmacist Review  Hulen Luster, RMA Clinical Pharmacist Assistant [phone]  10 minutes spent in review, coordination, and documentation.  Contacted cardiology regarding refusal of Eliquis.  Reviewed by: Willa Frater, PharmD Clinical Pharmacist [facility] [phone])  [phone]  

## 2020-11-26 ENCOUNTER — Telehealth (INDEPENDENT_AMBULATORY_CARE_PROVIDER_SITE_OTHER): Payer: Self-pay

## 2020-11-26 DIAGNOSIS — I872 Venous insufficiency (chronic) (peripheral): Secondary | ICD-10-CM | POA: Diagnosis not present

## 2020-11-26 DIAGNOSIS — I2510 Atherosclerotic heart disease of native coronary artery without angina pectoris: Secondary | ICD-10-CM | POA: Diagnosis not present

## 2020-11-26 DIAGNOSIS — I509 Heart failure, unspecified: Secondary | ICD-10-CM | POA: Diagnosis not present

## 2020-11-26 DIAGNOSIS — J449 Chronic obstructive pulmonary disease, unspecified: Secondary | ICD-10-CM | POA: Diagnosis not present

## 2020-11-26 DIAGNOSIS — I110 Hypertensive heart disease with heart failure: Secondary | ICD-10-CM | POA: Diagnosis not present

## 2020-11-26 DIAGNOSIS — M6281 Muscle weakness (generalized): Secondary | ICD-10-CM | POA: Diagnosis not present

## 2020-11-26 DIAGNOSIS — L97921 Non-pressure chronic ulcer of unspecified part of left lower leg limited to breakdown of skin: Secondary | ICD-10-CM | POA: Diagnosis not present

## 2020-11-26 DIAGNOSIS — Z87891 Personal history of nicotine dependence: Secondary | ICD-10-CM | POA: Diagnosis not present

## 2020-11-26 DIAGNOSIS — I4891 Unspecified atrial fibrillation: Secondary | ICD-10-CM | POA: Diagnosis not present

## 2020-11-26 DIAGNOSIS — Z4800 Encounter for change or removal of nonsurgical wound dressing: Secondary | ICD-10-CM | POA: Diagnosis not present

## 2020-11-26 NOTE — Telephone Encounter (Signed)
Safari from [facility] called to see if she could use medi homey on patient wound. Patient had Santyl sent into pharmacy but refuses to pick up due to cost. Home health nurse has been made aware that she can use the medi honey per Sheppard Plumber NP.

## 2020-12-01 ENCOUNTER — Telehealth: Payer: Self-pay | Admitting: *Deleted

## 2020-12-01 ENCOUNTER — Ambulatory Visit (INDEPENDENT_AMBULATORY_CARE_PROVIDER_SITE_OTHER): Payer: Medicare Other | Admitting: Nurse Practitioner

## 2020-12-01 NOTE — Telephone Encounter (Signed)
Thank you for speaking with him and clarifying! Appreciate you getting him scheduled for follow up.   Blood blisters would be an uncommon reaction to Eliquis. Skin reactions occur typically <1% of the time but not impossible. Would he be willing to trial Xarelto 20 mg daily with supper?  It is important that he remain on a blood thinner in the setting of atrial fibrillation to protect him from having a stroke.Would prefer he resume some sort of anticoagulation before our next follow up. If he wishes to trial Xarelto, he could pick up samples or send coupon for free 30 day supply.  We can defer reinitiation of spironolactone, per my discussion with the pharmacist he is concerned regarding gynecomastia and anticoagulation is more important at this time.   Continue Losartan 25mg  QD and Metoprolol 25mg  twice daily - will reassess blood pressure and heart rate at upcoming follow up. Will address Rosuvastatin at follow up as well.   , NP

## 2020-12-01 NOTE — Telephone Encounter (Signed)
Will do!  Alver Sorrow, NP

## 2020-12-01 NOTE — Telephone Encounter (Signed)
-----   Message from Alver Sorrow, NP sent at 12/01/2020  7:36 AM EDT ----- Regarding: Erik [name] Received notice from pharmacy that Erik [name] has declined his Erik [name]. Can we please reach out to him to inquire why?  He has chronic atrial fibrillation with CHADS2VASc of at least 3 giving him a 3.2% chance of stroke when he does not take anticoagulation. If it is a cost issue, happy to fill out patient assistance paperwork. Can also offer samples, if available.  Pharmacist also made me aware that he declined Spironolactone, can remove from med list if he is not taking. Erik [name] is the priority to resume.   TH=hanks,  Alver Sorrow, NP

## 2020-12-01 NOTE — Telephone Encounter (Signed)
I called and spoke with the patient to follow up on why he declined his eliquis at the pharmacy.  Per the patient, he took eliquis for 3 weeks, but developed blood blisters to his chest/ neck area ~ 3-4 days after starting this.  I inquired if he had noticed any bleeding in his urine/ stool/ abnormal gum bleeding/ nose bleeding while on the eliquis. He advised "I think I had some blood in my urine, it looked a little red."  However, since stopping eliquis, his symptoms seem to be improving.  He states he does not want to take this. I advised him of his stroke risk and he advised he was not overly concerned about this. I inquired about spironolactone and he advised he was not taking this either because, "I know what that does to you."  I reviewed his medication list with him and confirmed. - he is not taking gabapentin "because you can get addicted to that and I'm not taking that" - he is using his ihalers - he is taking losartan 25 mg, but a whole tablet once daily (he did not realize the RX said to take 0.5 tablet once daily and is concerned the pill is too small to cut in half) - he is taking metoprolol tartrate 25 mg, but just 1 tablet twice daily (because he is seeing HR's in the 30-40's at times) - not taking roflumilast - he is taking torsemide as prescribed - he is not taking rosuvastatin  He states he checks his BP periodically at home and it is "good."  I advised the patient I will update his medication list to reflect what his is taking and forward to Gillian Shields, NP as an [place].   The patient stated "she's gone isn't she." I advise her transition to the [facility] location has been postponed a little bit. He was scheduled to follow up with Cadence on 12/21/20, but I offered to switch this to Caitlin's schedule as she will be here that same day. The patient was agreeable.

## 2020-12-02 ENCOUNTER — Encounter (INDEPENDENT_AMBULATORY_CARE_PROVIDER_SITE_OTHER): Payer: Self-pay | Admitting: Nurse Practitioner

## 2020-12-02 NOTE — Telephone Encounter (Signed)
Called Mr. Erik [name] regarding recommendation for anticoagulation.  Left detailed VM per DPR asking if he would be willing to try Xarelto 20 mg daily and requesting call back.   Will attempt to call again tomorrow if we do not hear back from him  Alver Sorrow, NP

## 2020-12-03 ENCOUNTER — Telehealth: Payer: Self-pay | Admitting: Pharmacist

## 2020-12-03 DIAGNOSIS — J449 Chronic obstructive pulmonary disease, unspecified: Secondary | ICD-10-CM | POA: Diagnosis not present

## 2020-12-03 DIAGNOSIS — I110 Hypertensive heart disease with heart failure: Secondary | ICD-10-CM | POA: Diagnosis not present

## 2020-12-03 DIAGNOSIS — I872 Venous insufficiency (chronic) (peripheral): Secondary | ICD-10-CM | POA: Diagnosis not present

## 2020-12-03 DIAGNOSIS — Z4800 Encounter for change or removal of nonsurgical wound dressing: Secondary | ICD-10-CM | POA: Diagnosis not present

## 2020-12-03 DIAGNOSIS — L97921 Non-pressure chronic ulcer of unspecified part of left lower leg limited to breakdown of skin: Secondary | ICD-10-CM | POA: Diagnosis not present

## 2020-12-03 DIAGNOSIS — I4891 Unspecified atrial fibrillation: Secondary | ICD-10-CM | POA: Diagnosis not present

## 2020-12-03 DIAGNOSIS — I2510 Atherosclerotic heart disease of native coronary artery without angina pectoris: Secondary | ICD-10-CM | POA: Diagnosis not present

## 2020-12-03 DIAGNOSIS — Z87891 Personal history of nicotine dependence: Secondary | ICD-10-CM | POA: Diagnosis not present

## 2020-12-03 DIAGNOSIS — M6281 Muscle weakness (generalized): Secondary | ICD-10-CM | POA: Diagnosis not present

## 2020-12-03 DIAGNOSIS — I509 Heart failure, unspecified: Secondary | ICD-10-CM | POA: Diagnosis not present

## 2020-12-03 NOTE — Progress Notes (Addendum)
    Chronic Care Management Pharmacy Assistant   Name: Erik [name]  MRN: [id number] DOB: 12/15/1955  Reason for Encounter: Adherence Review  Medications: Outpatient Encounter Medications as of 12/03/2020  Medication Sig   collagenase (SANTYL) ointment Apply 1 application with unna wrap changes   Humidifier MISC Use with O2 at all times   ipratropium-albuterol (DUONEB) 0.5-2.5 (3) MG/3ML SOLN USE 1 VIAL VIA NEBULIZER EVERY 6 HOURS   loratadine (CLARITIN) 10 MG tablet Take 1 tablet (10 mg total) by mouth daily.   losartan (COZAAR) 25 MG tablet Take 1 tablet (25 mg) by mouth once daily   metoprolol tartrate (LOPRESSOR) 25 MG tablet Take 1 tablet (25 mg) by mouth twice daily   OXYGEN Inhale 2-3 L into the lungs daily.   torsemide (DEMADEX) 10 MG tablet Take 1 tablet (10 mg total) by mouth daily.   TRELEGY ELLIPTA 100-62.5-25 MCG/INH AEPB Inhale 1 puff into the lungs daily.   No facility-administered encounter medications on file as of 12/03/2020.   Reviewed the patients chart for any medical/health and/or medication changes. 12/01/20 (Telephone) with the Cardiologist with Alver Sorrow, NP. CHANGED Losartan Potassium to 25 mg 1 tablet daily, Metoprolol Tartrate 25 mg 1 tablet by mouth twice daily. STOPPED Apixaban, Gabapentin, Roflimilast, Rosuvastatin, and Spironolactone.    Follow-Up:Pharmacist Review  Hulen Luster, RMA Clinical Pharmacist Assistant [phone]

## 2020-12-03 NOTE — Telephone Encounter (Signed)
Called Mr. Kant regarding anticoagulation recommendations.   Lots of leg, hip, and back pain. Tells me he has been trying to take it easy as well as Tylenol/Ibuprofen. Tells me Ibuprofen has been helpful. He is taking Ibuprofen in the morning and occasionally in the afternoon. Tells me this has been going on for years and seems to be getting worse. Encouraged to discuss with his primary care provider.   Notes no lightheadedness nor dizziness. No palpitations. Tells me when he took the Eliquis he was getting "red welts" on his neck, chest, and arms. Tells me they have gotten better but are still there. He has not been putting anything on them - encouraged to trial hydrocortisone cream.   When we discussed the stroke risk off of anticoagulation he tells me "If I make it day by day I'm happy". He is not interested in trialing Xarelto at this time. Reiterated risk of stroke and educated on signs of stroke. He verbalized understanding. Tells me his wife and daughter both have had stroke and he is aware of the signs and symptoms.  Does feel better taking the Metoprolol 25mg  twice daily and notice it "keeps his heart down".   , NP

## 2020-12-05 DIAGNOSIS — I10 Essential (primary) hypertension: Secondary | ICD-10-CM | POA: Diagnosis not present

## 2020-12-05 DIAGNOSIS — J449 Chronic obstructive pulmonary disease, unspecified: Secondary | ICD-10-CM | POA: Diagnosis not present

## 2020-12-08 ENCOUNTER — Encounter (INDEPENDENT_AMBULATORY_CARE_PROVIDER_SITE_OTHER): Payer: Self-pay | Admitting: Nurse Practitioner

## 2020-12-08 ENCOUNTER — Other Ambulatory Visit: Payer: Self-pay

## 2020-12-08 ENCOUNTER — Ambulatory Visit (INDEPENDENT_AMBULATORY_CARE_PROVIDER_SITE_OTHER): Payer: Medicare Other | Admitting: Nurse Practitioner

## 2020-12-08 VITALS — BP 130/79 | HR 96 | Resp 16

## 2020-12-08 DIAGNOSIS — L97909 Non-pressure chronic ulcer of unspecified part of unspecified lower leg with unspecified severity: Secondary | ICD-10-CM

## 2020-12-08 DIAGNOSIS — I83009 Varicose veins of unspecified lower extremity with ulcer of unspecified site: Secondary | ICD-10-CM | POA: Diagnosis not present

## 2020-12-08 DIAGNOSIS — I10 Essential (primary) hypertension: Secondary | ICD-10-CM | POA: Diagnosis not present

## 2020-12-08 DIAGNOSIS — E782 Mixed hyperlipidemia: Secondary | ICD-10-CM | POA: Diagnosis not present

## 2020-12-10 DIAGNOSIS — I4891 Unspecified atrial fibrillation: Secondary | ICD-10-CM | POA: Diagnosis not present

## 2020-12-10 DIAGNOSIS — M6281 Muscle weakness (generalized): Secondary | ICD-10-CM | POA: Diagnosis not present

## 2020-12-10 DIAGNOSIS — I2510 Atherosclerotic heart disease of native coronary artery without angina pectoris: Secondary | ICD-10-CM | POA: Diagnosis not present

## 2020-12-10 DIAGNOSIS — I872 Venous insufficiency (chronic) (peripheral): Secondary | ICD-10-CM | POA: Diagnosis not present

## 2020-12-10 DIAGNOSIS — J449 Chronic obstructive pulmonary disease, unspecified: Secondary | ICD-10-CM | POA: Diagnosis not present

## 2020-12-10 DIAGNOSIS — L97921 Non-pressure chronic ulcer of unspecified part of left lower leg limited to breakdown of skin: Secondary | ICD-10-CM | POA: Diagnosis not present

## 2020-12-10 DIAGNOSIS — Z87891 Personal history of nicotine dependence: Secondary | ICD-10-CM | POA: Diagnosis not present

## 2020-12-10 DIAGNOSIS — I509 Heart failure, unspecified: Secondary | ICD-10-CM | POA: Diagnosis not present

## 2020-12-10 DIAGNOSIS — Z4800 Encounter for change or removal of nonsurgical wound dressing: Secondary | ICD-10-CM | POA: Diagnosis not present

## 2020-12-10 DIAGNOSIS — I110 Hypertensive heart disease with heart failure: Secondary | ICD-10-CM | POA: Diagnosis not present

## 2020-12-14 ENCOUNTER — Encounter (INDEPENDENT_AMBULATORY_CARE_PROVIDER_SITE_OTHER): Payer: Self-pay | Admitting: Nurse Practitioner

## 2020-12-14 NOTE — Progress Notes (Signed)
Subjective:    Patient ID: Erik [name], male    DOB: 06/16/1956, 65 y.o.   MRN: [id number] Chief Complaint  Patient presents with  . Follow-up    Unna boot check    The patient presents today for evaluation of the ulceration on his left posterior calf.  The patient's wound has continued to decrease in size but the progression has slowed.  Wound bed is also covered with slough.  The patient swelling continues to look better than it has previously.  He denies any fever or chills.  He denies any chest pain or shortness of breath.  The patient was unable to obtain the Santyl as we previously discussed but we will utilize Medihoney instead   Review of Systems  Cardiovascular: Positive for leg swelling.  Skin: Positive for wound.  Neurological: Positive for weakness.       Objective:   Physical Exam Vitals reviewed.  HENT:     Head: Normocephalic.  Cardiovascular:     Rate and Rhythm: Normal rate.     Pulses: Normal pulses.  Pulmonary:     Effort: Pulmonary effort is normal.  Skin:    General: Skin is warm and dry.  Neurological:     Mental Status: He is alert and oriented to person, place, and time.     Motor: Weakness present.     Gait: Gait abnormal.  Psychiatric:        Mood and Affect: Mood normal.        Behavior: Behavior normal.        Thought Content: Thought content normal.        Judgment: Judgment normal.     BP 130/79 (BP Location: Left Arm)   Pulse 96   Resp 16   Past Medical History:  Diagnosis Date  . Arthritis   . Asthma   . Atrial fibrillation (HCC)   . CHF (congestive heart failure) (HCC)   . COPD (chronic obstructive pulmonary disease) (HCC)   . Coronary artery disease   . GERD (gastroesophageal reflux disease)   . Hypertension   . Myocardial infarction (HCC)   . Shortness of breath dyspnea     Social History   Socioeconomic History  . Marital status: Married    Spouse name: Not on file  . Number of children: Not on file  . Years  of education: Not on file  . Highest education level: Not on file  Occupational History  . Not on file  Tobacco Use  . Smoking status: Former Smoker    Years: 25.00    Quit date: 03/20/1997    Years since quitting: 23.7  . Smokeless tobacco: Never Used  Vaping Use  . Vaping Use: Never used  Substance and Sexual Activity  . Alcohol use: No  . Drug use: No  . Sexual activity: Not on file  Other Topics Concern  . Not on file  Social History Narrative  . Not on file   Social Determinants of Health   Financial Resource Strain: Low Risk   . Difficulty of Paying Living Expenses: Not hard at all  Food Insecurity: Not on file  Transportation Needs: Not on file  Physical Activity: Not on file  Stress: Not on file  Social Connections: Not on file  Intimate Partner Violence: Not on file    Past Surgical History:  Procedure Laterality Date  . ANGIOPLASTY  2009  . CARDIAC CATHETERIZATION N/A 03/18/2015   Procedure: Left Heart Cath with Coronary Angiography;  Surgeon: Lamar Blinks, MD;  Location: [facility] INVASIVE CV LAB;  Service: Cardiovascular;  Laterality: N/A;  . CHOLECYSTECTOMY  2005  . CORONARY ANGIOGRAM  2009    Family History  Problem Relation Age of Onset  . Coronary artery disease Father   . Diabetes Father   . Hyperlipidemia Father   . Hypertension Father   . Stroke Mother   . Hyperlipidemia Mother     Allergies  Allergen Reactions  . Benadryl [Diphenhydramine Hcl] Shortness Of Breath  . Morphine And Related     CBC Latest Ref Rng & Units 11/02/2020 02/19/2020 08/14/2017  WBC 4.0 - 10.5 K/uL 8.5 8.5 8.3  Hemoglobin 13.0 - 17.0 g/dL 33.0 07.6 22.6  Hematocrit 39.0 - 52.0 % 45.8 45.6 50.3  Platelets 150 - 400 K/uL 350 345 329      CMP     Component Value Date/Time   NA 137 11/02/2020 1649   NA 138 02/19/2020 0000   NA 135 (L) 07/19/2013 0417   K 4.4 11/02/2020 1649   K 3.9 07/19/2013 0417   CL 102 11/02/2020 1649   CL 101 07/19/2013 0417   CO2 27  11/02/2020 1649   CO2 29 07/19/2013 0417   GLUCOSE 101 (H) 11/02/2020 1649   GLUCOSE 86 07/19/2013 0417   BUN 13 11/02/2020 1649   BUN 14 02/19/2020 0000   BUN 10 07/19/2013 0417   CREATININE 0.80 11/02/2020 1649   CREATININE 0.80 07/19/2013 0417   CALCIUM 9.1 11/02/2020 1649   CALCIUM 8.4 (L) 07/19/2013 0417   PROT 6.3 02/19/2020 0000   PROT 6.6 07/19/2013 0417   ALBUMIN 4.2 02/19/2020 0000   ALBUMIN 2.8 (L) 07/19/2013 0417   AST 14 02/19/2020 0000   AST 33 07/19/2013 0417   ALT 12 02/19/2020 0000   ALT 32 07/19/2013 0417   ALKPHOS 81 02/19/2020 0000   ALKPHOS 85 07/19/2013 0417   BILITOT 0.4 02/19/2020 0000   BILITOT 1.6 (H) 07/19/2013 0417   GFRNONAA >60 11/02/2020 1649   GFRNONAA >60 07/19/2013 0417   GFRAA 105 02/19/2020 0000   GFRAA >60 07/19/2013 0417     No results found.     Assessment & Plan:   1. Venous ulcer (HCC) The patient's ulceration is still slow to heal.  We originally were going to place Santyl ointment on the wound however this was very expensive for the patient.  Therefore we will try to utilize Medihoney instead.  Patient is advised to continue to elevate his lower extremity as he can.  The patient will continue to have his legs wrapped by home health we will have him follow-up in 4 weeks.  2. Essential hypertension with goal blood pressure less than 130/80 Continue antihypertensive medications as already ordered, these medications have been reviewed and there are no changes at this time.   3. Combined hyperlipidemia Continue statin as ordered and reviewed, no changes at this time    Current Outpatient Medications on File Prior to Visit  Medication Sig Dispense Refill  . collagenase (SANTYL) ointment Apply 1 application with unna wrap changes 30 g 3  . Humidifier MISC Use with O2 at all times 1 each 0  . ipratropium-albuterol (DUONEB) 0.5-2.5 (3) MG/3ML SOLN USE 1 VIAL VIA NEBULIZER EVERY 6 HOURS 360 mL 6  . loratadine (CLARITIN) 10 MG tablet  Take 1 tablet (10 mg total) by mouth daily. 90 tablet 1  . losartan (COZAAR) 25 MG tablet Take 1 tablet (25 mg) by mouth once daily    .  metoprolol tartrate (LOPRESSOR) 25 MG tablet Take 1 tablet (25 mg) by mouth twice daily    . OXYGEN Inhale 2-3 L into the lungs daily.    . rosuvastatin (CRESTOR) 10 MG tablet SMARTSIG:1 Tablet(s) By Mouth Every Evening    . torsemide (DEMADEX) 10 MG tablet Take 1 tablet (10 mg total) by mouth daily. 30 tablet 5  . TRELEGY ELLIPTA 100-62.5-25 MCG/INH AEPB Inhale 1 puff into the lungs daily. 1 each 3   No current facility-administered medications on file prior to visit.    There are no Patient Instructions on file for this visit. No follow-ups on file.   Georgiana Spinner, NP

## 2020-12-15 DIAGNOSIS — I872 Venous insufficiency (chronic) (peripheral): Secondary | ICD-10-CM | POA: Diagnosis not present

## 2020-12-15 DIAGNOSIS — I2510 Atherosclerotic heart disease of native coronary artery without angina pectoris: Secondary | ICD-10-CM | POA: Diagnosis not present

## 2020-12-15 DIAGNOSIS — I110 Hypertensive heart disease with heart failure: Secondary | ICD-10-CM | POA: Diagnosis not present

## 2020-12-15 DIAGNOSIS — R0902 Hypoxemia: Secondary | ICD-10-CM | POA: Diagnosis not present

## 2020-12-15 DIAGNOSIS — L97921 Non-pressure chronic ulcer of unspecified part of left lower leg limited to breakdown of skin: Secondary | ICD-10-CM | POA: Diagnosis not present

## 2020-12-15 DIAGNOSIS — J449 Chronic obstructive pulmonary disease, unspecified: Secondary | ICD-10-CM | POA: Diagnosis not present

## 2020-12-15 DIAGNOSIS — Z87891 Personal history of nicotine dependence: Secondary | ICD-10-CM | POA: Diagnosis not present

## 2020-12-15 DIAGNOSIS — I4891 Unspecified atrial fibrillation: Secondary | ICD-10-CM | POA: Diagnosis not present

## 2020-12-15 DIAGNOSIS — Z4800 Encounter for change or removal of nonsurgical wound dressing: Secondary | ICD-10-CM | POA: Diagnosis not present

## 2020-12-15 DIAGNOSIS — M6281 Muscle weakness (generalized): Secondary | ICD-10-CM | POA: Diagnosis not present

## 2020-12-15 DIAGNOSIS — I509 Heart failure, unspecified: Secondary | ICD-10-CM | POA: Diagnosis not present

## 2020-12-16 DIAGNOSIS — J439 Emphysema, unspecified: Secondary | ICD-10-CM | POA: Diagnosis not present

## 2020-12-16 DIAGNOSIS — J449 Chronic obstructive pulmonary disease, unspecified: Secondary | ICD-10-CM | POA: Diagnosis not present

## 2020-12-21 ENCOUNTER — Ambulatory Visit: Payer: Medicare Other | Admitting: Medical

## 2020-12-21 ENCOUNTER — Ambulatory Visit: Payer: Medicare Other | Admitting: Family

## 2020-12-21 NOTE — Progress Notes (Deleted)
Office Visit    Patient Name: Erik [name] Date of Encounter: 12/21/2020  PCP:  Lyndon Code, MD   [facility]  Cardiologist:  Lorine Bears, MD  Advanced Practice Provider:  No care team member to display Electrophysiologist:  None   Chief Complaint    Erik [name] is a 65 y.o. male with a hx of coronary artery disease, atrial fibrillation, chronic anticoagulation, HFrEF, COPD on home O2, arthritis, hyperlipidemia presents today for follow-up after addition of metoprolol and echocardiogram  Past Medical History    Past Medical History:  Diagnosis Date  . Arthritis   . Asthma   . Atrial fibrillation (HCC)   . CHF (congestive heart failure) (HCC)   . COPD (chronic obstructive pulmonary disease) (HCC)   . Coronary artery disease   . GERD (gastroesophageal reflux disease)   . Hypertension   . Myocardial infarction (HCC)   . Shortness of breath dyspnea    Past Surgical History:  Procedure Laterality Date  . ANGIOPLASTY  2009  . CARDIAC CATHETERIZATION N/A 03/18/2015   Procedure: Left Heart Cath with Coronary Angiography;  Surgeon: Lamar Blinks, MD;  Location: [facility] INVASIVE CV LAB;  Service: Cardiovascular;  Laterality: N/A;  . CHOLECYSTECTOMY  2005  . CORONARY ANGIOGRAM  2009    Allergies  Allergies  Allergen Reactions  . Benadryl [Diphenhydramine Hcl] Shortness Of Breath  . Morphine And Related     History of Present Illness    Erik [name] is a 65 y.o. male with a hx of coronary artery disease, atrial fibrillation, chronic anticoagulation, HFrEF, COPD on home O2, arthritis, hyperlipidemia last seen 09/29/20 by Dr. Kirke Corin.  Coronary artery disease s/p bare-metal stent2009.  Cardiac catheterization 2016 with patent onto stent with mild in-stent stenosis, chronically occluded proximal RCA with good collateral, moderate mid LAD stenosis and mild LCx disease.  He was previously followed by Dr. Gwen Pounds of [facility] cardiology.  He was hospitalized in 2014 with mild acute pancreatitis and anticoagulation with Pradaxa was discontinued at that time due to compliance issue per documentation. No bleeding event noted.   Previous echocardiogram 08/14/17 EF 55-60% ? 03/13/20 EF 40% (performed at outside facility).  Seen by Dr. Kirke Corin 09/29/2020 as a new patient noting worsening exertional dyspnea as well as intermittent tachycardia since an upper respiratory infection in December. He also noted significant bilateral lower extremity edema though no weight gain.  Ventricular rate was not well controlled with EKG showing atrial fibrillation 111 BPM.  Metoprolol 25 mg twice daily was added with plan to optimize HF medications and potentially transition to Toprol and consideration of cardiac cath at follow up.   He was seen in follow-up 11/02/2020.  Echocardiogram was reviewed results indication for cardiac catheterization and he was hesitant at that time.  He noted previous intolerance to pravastatin but was agreeable to try rosuvastatin.  His diltiazem was discontinued due to reduced EF and he was started on losartan as well as metoprolol.  He presents today for follow-up.  When asked he is still not interested in cardiac catheterization despite discussion regarding the indication in setting of reduced LVEF.  He tells me he previously was told that there was "nothing they could do "about the stent we discussed that there could be a new plaque buildup or blockage.  He continues to decline cardiac catheterization.  His weight is down 15 pounds since clinic visit 2 weeks ago.  Reports his blood pressure and heart rate have  been well controlled at home.  Denies chest pain, pressure, tightness.  Reports stable dyspnea on exertion.  Does note he has been waking up in the evening with headaches which she thinks is due to pollen and is relieved by Tylenol.  He has also been taking Claritin intermittently with some relief.  Tells me his legs have stopped  swelling since discontinuing diltiazem.  Reports bruising on Eliquis but no hematuria nor melena.  Tells me he has been having "milligrams" for the last 2 weeks on and off and was encouraged to follow-up with his primary care provider.  We discussed his Crestor nor Eliquis.  ***  EKGs/Labs/Other Studies Reviewed:   The following studies were reviewed today:  Echo 09/30/20 1. Left ventricular ejection fraction, by estimation, is 40 to 45%. The  left ventricle has mild to moderately decreased function. Left ventricular  endocardial border not optimally defined to evaluate regional wall motion.  Left ventricular diastolic  parameters are indeterminate.   2. Right ventricular systolic function is low normal. The right  ventricular size is normal.   3. Left atrial size was severely dilated.   4. Right atrial size was moderately dilated.   5. The mitral valve is degenerative. Mild mitral valve regurgitation.   6. The aortic valve was not well visualized. Aortic valve regurgitation  is not visualized. Mild aortic valve sclerosis is present, with no  evidence of aortic valve stenosis.   7. The inferior vena cava is normal in size with greater than 50%  respiratory variability, suggesting right atrial pressure of 3 mmHg   LHC 03/2015  Prox RCA lesion, 100% stenosed.  Mid LAD lesion, 40% stenosed.  Mid Cx to Dist Cx lesion, 30% stenosed.   Assessment The patient has had progressive canadian class 3 anginal symptoms with a high probability stress test with risk factors including high blood pressure and high cholesterol.   abnormal left ventricular function with ejection fraction of 35%   severe 1 vessel coronary artery disease    There is significant stenosis of right coronary artery   Plan Continue medical management of CAD risk factors, Additional medications for management of angina and No further cardiac intervention at this time  EKG:  NoEKG is ordered today.  The ekg  independently reviewed from 11/02/2020 demonstrated rate controlled atrial fibrillation 84bpm***  Recent Labs: 02/19/2020: ALT 12; TSH 1.380 11/02/2020: BUN 13; Creatinine, Ser 0.80; Hemoglobin 15.2; Platelets 350; Potassium 4.4; Sodium 137  Recent Lipid Panel    Component Value Date/Time   CHOL 223 (H) 11/02/2020 1649   CHOL 184 02/19/2020 0000   CHOL 146 07/18/2013 0934   TRIG 83 11/02/2020 1649   TRIG 53 07/18/2013 0934   HDL 57 11/02/2020 1649   HDL 49 02/19/2020 0000   HDL 48 07/18/2013 0934   CHOLHDL 3.9 11/02/2020 1649   VLDL 17 11/02/2020 1649   VLDL 11 07/18/2013 0934   LDLCALC 149 (H) 11/02/2020 1649   LDLCALC 115 (H) 02/19/2020 0000   LDLCALC 87 07/18/2013 0934   LDLDIRECT 140.3 (H) 11/02/2020 1649    Risk Assessment/Calculations:   CHA2DS2-VASc Score = 3  This indicates a 3.2% annual risk of stroke. The patient's score is based upon: CHF History: Yes HTN History: No Diabetes History: No Stroke History: No Vascular Disease History: Yes Age Score: 1 Gender Score: 0  Home Medications   No outpatient medications have been marked as taking for the 12/21/20 encounter (Appointment) with Alver Sorrow, NP.  Review of Systems  All other systems reviewed and are otherwise negative except as noted above.  Physical Exam    VS:  There were no vitals taken for this visit. , BMI There is no height or weight on file to calculate BMI.  Wt Readings from Last 3 Encounters:  11/16/20 212 lb (96.2 kg)  11/03/20 227 lb (103 kg)  11/02/20 227 lb (103 kg)  *** GEN: Well nourished, well developed, in no acute distress. HEENT: normal. Neck: Supple, no JVD, carotid bruits, or masses. Cardiac: Irregularly irregular, no murmurs, rubs, or gallops. No clubbing, cyanosis, edema.  Radials 2+ and equal bilaterally.  Respiratory:  Respirations regular and unlabored, clear to auscultation bilaterally. On 2L O2. GI: Soft, nontender, nondistended. MS: No deformity or  atrophy. Skin: Warm and dry, no rash. LLE in [employer].  Neuro:  Strength and sensation are intact. Psych: Normal affect.  Assessment & Plan   *** 1. HFrEF - Echo 03/18/20 with newly reduced LVEF 40%. Not seen in cardiology follow up until 09/2020 with echo 09/30/20 LVEF 40-45%. NYHA II-III with dyspnea, orthopnea.   GDMT includes metoprolol tartrate 50 mg twice daily, losartan 12.5 mg daily, torsemide 10 mg daily.  Start spironolactone 12.5 mg daily.  BMP in 1 week.  Future considerations include transition to [facility] or addition of [place]   Discussed indication for ischemic evaluation in the setting of newly reduced LVEF. We discussed cardiac catheterization and that risks include but are not limited to stroke (1 in 1000), death (1 in 1000), kidney failure [usually temporary] (1 in 500), bleeding (1 in 200), allergic reaction [possibly serious] (1 in 200). He continues to politely decline as he has been told in the past that there was "nothing they could do".  Discussed that there could be a new blockage but he is still unwilling to proceed with cardiac catheterization.  Declines cardiac rehab referral.  2. Chronic atrial fibrillation / chronic anticoagulation -rate controlled on present dose of metoprolol.  Reports tolerating Eliquis 5mg  BID. CHA2DS2-VASc Score = 3 [CHF History: Yes, HTN History: No, Diabetes History: No, Stroke History: No, Vascular Disease History: Yes, Age Score: 1, Gender Score: 0].  Therefore, the patient's annual risk of stroke is 3.2 %.  CBC today for monitoring.   3. CAD -reports no chest pain, pressure, tightness.  Again discussed indication for ischemic evaluation in setting of newly reduced LVEF, as above. GDMT includes beta blocker, crestor  4. Venous ulcer - Follows with [facility]. LLE with unna boot in place.  Edema improved since discontinuation of diltiazem.  5. COPD - Continue to follow with pulmonology. No signs of acute exacerbation.   6. HLD, LDL goal <70  -11/02/2020 direct LDL 140.  Started on Crestor 10 mg daily.  Plan for repeat lipid panel at follow-up.    Disposition: Follow up in 1 month(s)*** with Dr. 11/04/2020 or APP.   Signed, Kirke Corin, NP 12/21/2020, 2:12 PM [facility]

## 2020-12-22 ENCOUNTER — Telehealth: Payer: Self-pay | Admitting: Pharmacist

## 2020-12-22 NOTE — Progress Notes (Addendum)
    Chronic Care Management Pharmacy Assistant   Name: Erik [name]  MRN: [id number] DOB: Jun 18, 1956  Reason for Encounter: Medication Review/Medication Coordination Call.   Medications: Outpatient Encounter Medications as of 12/22/2020  Medication Sig   collagenase (SANTYL) ointment Apply 1 application with unna wrap changes   Humidifier MISC Use with O2 at all times   ipratropium-albuterol (DUONEB) 0.5-2.5 (3) MG/3ML SOLN USE 1 VIAL VIA NEBULIZER EVERY 6 HOURS   loratadine (CLARITIN) 10 MG tablet Take 1 tablet (10 mg total) by mouth daily.   losartan (COZAAR) 25 MG tablet Take 1 tablet (25 mg) by mouth once daily   metoprolol tartrate (LOPRESSOR) 25 MG tablet Take 1 tablet (25 mg) by mouth twice daily   OXYGEN Inhale 2-3 L into the lungs daily.   rosuvastatin (CRESTOR) 10 MG tablet SMARTSIG:1 Tablet(s) By Mouth Every Evening   torsemide (DEMADEX) 10 MG tablet Take 1 tablet (10 mg total) by mouth daily.   TRELEGY ELLIPTA 100-62.5-25 MCG/INH AEPB Inhale 1 puff into the lungs daily.   No facility-administered encounter medications on file as of 12/22/2020.    Reviewed chart for medication changes ahead of medication coordination call.  No OVs or hospital visits since last care coordination call.  Consult Visit: 12/08/20 Georgiana Spinner, NP. For venous ulcer. No medication changes.   No medication changes indicated.  BP Readings from Last 3 Encounters:  12/08/20 130/79  11/16/20 120/80  11/03/20 (!) 138/92    Lab Results  Component Value Date   HGBA1C 6.3 07/19/2013     Patient obtains medications through Vials  30 Days   Last adherence delivery included: Losartan 25 mg 1 tablet daily  Metoprolol Tar 25 mg 2 tablets for breakfast and evening  Rosuvastatin 10 mg 1 tablet daily  Torsemide 10 mg 1 tablet daily  Loratadine 10 mg  1 tablet daily   Patient declined (meds) last month: Ipratropium 0.5 mg (enough on hand) Trelegy Aer 100 mg (enough on hand) Eliquis 5 mg (  Patient does not want to take) Spironolactone 25 mg (Patient does not want to take this right now) Aspirin 81 mg 1 tablet daily (OTC)  Patient is due for next adherence delivery on: 01/01/21.  Called patient and reviewed medications and coordinated delivery.  This delivery to include: Trelegy Aer 100 mg  Loratadine 10 mg  1 tablet daily   Patient declined the following medication: Torsemide 10 mg 1 tablet daily (enough on hand from previous pharmacy) Rosuvastatin 10 mg 1 tablet daily ( enough on hand from previous pharmacy) Metoprolol Tar 25 mg 2 tablets for breakfast and evening (enough on hand from previous pharmacy) Losartan 25 mg 1/2 tablet daily ( Patient stated he's not taking it) Eliquis 5 mg ( Patient does not want to take) Spironolactone 25 mg (Patient does not want to take this right now) Aspirin 81 mg 1 tablet daily (OTC) Ipratropium 0.5 mg (enough on hand)  Patient does not need refills at this time.   Confirmed delivery date of 01/01/21, advised patient that pharmacy will contact them the morning of delivery.  Follow-Up:Pharmacist Review   Hulen Luster, RMA Clinical Pharmacist Assistant [phone]  10 minutes spent in review, coordination, and documentation.  Reviewed by: Willa Frater, PharmD Clinical Pharmacist [facility] [phone]

## 2020-12-25 DIAGNOSIS — I872 Venous insufficiency (chronic) (peripheral): Secondary | ICD-10-CM | POA: Diagnosis not present

## 2020-12-25 DIAGNOSIS — J449 Chronic obstructive pulmonary disease, unspecified: Secondary | ICD-10-CM | POA: Diagnosis not present

## 2020-12-25 DIAGNOSIS — I4891 Unspecified atrial fibrillation: Secondary | ICD-10-CM | POA: Diagnosis not present

## 2020-12-25 DIAGNOSIS — L97921 Non-pressure chronic ulcer of unspecified part of left lower leg limited to breakdown of skin: Secondary | ICD-10-CM | POA: Diagnosis not present

## 2020-12-25 DIAGNOSIS — Z87891 Personal history of nicotine dependence: Secondary | ICD-10-CM | POA: Diagnosis not present

## 2020-12-25 DIAGNOSIS — I509 Heart failure, unspecified: Secondary | ICD-10-CM | POA: Diagnosis not present

## 2020-12-25 DIAGNOSIS — Z4800 Encounter for change or removal of nonsurgical wound dressing: Secondary | ICD-10-CM | POA: Diagnosis not present

## 2020-12-25 DIAGNOSIS — I2510 Atherosclerotic heart disease of native coronary artery without angina pectoris: Secondary | ICD-10-CM | POA: Diagnosis not present

## 2020-12-25 DIAGNOSIS — M6281 Muscle weakness (generalized): Secondary | ICD-10-CM | POA: Diagnosis not present

## 2020-12-25 DIAGNOSIS — I110 Hypertensive heart disease with heart failure: Secondary | ICD-10-CM | POA: Diagnosis not present

## 2021-01-01 DIAGNOSIS — I2510 Atherosclerotic heart disease of native coronary artery without angina pectoris: Secondary | ICD-10-CM | POA: Diagnosis not present

## 2021-01-01 DIAGNOSIS — I872 Venous insufficiency (chronic) (peripheral): Secondary | ICD-10-CM | POA: Diagnosis not present

## 2021-01-01 DIAGNOSIS — I4891 Unspecified atrial fibrillation: Secondary | ICD-10-CM | POA: Diagnosis not present

## 2021-01-01 DIAGNOSIS — J449 Chronic obstructive pulmonary disease, unspecified: Secondary | ICD-10-CM | POA: Diagnosis not present

## 2021-01-01 DIAGNOSIS — Z4800 Encounter for change or removal of nonsurgical wound dressing: Secondary | ICD-10-CM | POA: Diagnosis not present

## 2021-01-01 DIAGNOSIS — M6281 Muscle weakness (generalized): Secondary | ICD-10-CM | POA: Diagnosis not present

## 2021-01-01 DIAGNOSIS — L97921 Non-pressure chronic ulcer of unspecified part of left lower leg limited to breakdown of skin: Secondary | ICD-10-CM | POA: Diagnosis not present

## 2021-01-01 DIAGNOSIS — I509 Heart failure, unspecified: Secondary | ICD-10-CM | POA: Diagnosis not present

## 2021-01-01 DIAGNOSIS — Z87891 Personal history of nicotine dependence: Secondary | ICD-10-CM | POA: Diagnosis not present

## 2021-01-01 DIAGNOSIS — I110 Hypertensive heart disease with heart failure: Secondary | ICD-10-CM | POA: Diagnosis not present

## 2021-01-05 ENCOUNTER — Ambulatory Visit (INDEPENDENT_AMBULATORY_CARE_PROVIDER_SITE_OTHER): Payer: Medicare Other | Admitting: Nurse Practitioner

## 2021-01-05 DIAGNOSIS — I10 Essential (primary) hypertension: Secondary | ICD-10-CM | POA: Diagnosis not present

## 2021-01-05 DIAGNOSIS — J449 Chronic obstructive pulmonary disease, unspecified: Secondary | ICD-10-CM | POA: Diagnosis not present

## 2021-01-06 ENCOUNTER — Encounter (INDEPENDENT_AMBULATORY_CARE_PROVIDER_SITE_OTHER): Payer: Self-pay | Admitting: Nurse Practitioner

## 2021-01-07 ENCOUNTER — Ambulatory Visit: Payer: Medicare Other | Admitting: Nurse Practitioner

## 2021-01-07 DIAGNOSIS — I872 Venous insufficiency (chronic) (peripheral): Secondary | ICD-10-CM | POA: Diagnosis not present

## 2021-01-07 DIAGNOSIS — M6281 Muscle weakness (generalized): Secondary | ICD-10-CM | POA: Diagnosis not present

## 2021-01-07 DIAGNOSIS — I2510 Atherosclerotic heart disease of native coronary artery without angina pectoris: Secondary | ICD-10-CM | POA: Diagnosis not present

## 2021-01-07 DIAGNOSIS — L97921 Non-pressure chronic ulcer of unspecified part of left lower leg limited to breakdown of skin: Secondary | ICD-10-CM | POA: Diagnosis not present

## 2021-01-07 DIAGNOSIS — Z87891 Personal history of nicotine dependence: Secondary | ICD-10-CM | POA: Diagnosis not present

## 2021-01-07 DIAGNOSIS — I4891 Unspecified atrial fibrillation: Secondary | ICD-10-CM | POA: Diagnosis not present

## 2021-01-07 DIAGNOSIS — J449 Chronic obstructive pulmonary disease, unspecified: Secondary | ICD-10-CM | POA: Diagnosis not present

## 2021-01-07 DIAGNOSIS — Z4800 Encounter for change or removal of nonsurgical wound dressing: Secondary | ICD-10-CM | POA: Diagnosis not present

## 2021-01-07 DIAGNOSIS — I509 Heart failure, unspecified: Secondary | ICD-10-CM | POA: Diagnosis not present

## 2021-01-07 DIAGNOSIS — I110 Hypertensive heart disease with heart failure: Secondary | ICD-10-CM | POA: Diagnosis not present

## 2021-01-14 DIAGNOSIS — L97921 Non-pressure chronic ulcer of unspecified part of left lower leg limited to breakdown of skin: Secondary | ICD-10-CM | POA: Diagnosis not present

## 2021-01-14 DIAGNOSIS — M6281 Muscle weakness (generalized): Secondary | ICD-10-CM | POA: Diagnosis not present

## 2021-01-14 DIAGNOSIS — I509 Heart failure, unspecified: Secondary | ICD-10-CM | POA: Diagnosis not present

## 2021-01-14 DIAGNOSIS — I110 Hypertensive heart disease with heart failure: Secondary | ICD-10-CM | POA: Diagnosis not present

## 2021-01-14 DIAGNOSIS — J449 Chronic obstructive pulmonary disease, unspecified: Secondary | ICD-10-CM | POA: Diagnosis not present

## 2021-01-14 DIAGNOSIS — Z87891 Personal history of nicotine dependence: Secondary | ICD-10-CM | POA: Diagnosis not present

## 2021-01-14 DIAGNOSIS — I2510 Atherosclerotic heart disease of native coronary artery without angina pectoris: Secondary | ICD-10-CM | POA: Diagnosis not present

## 2021-01-14 DIAGNOSIS — I252 Old myocardial infarction: Secondary | ICD-10-CM | POA: Diagnosis not present

## 2021-01-14 DIAGNOSIS — I4891 Unspecified atrial fibrillation: Secondary | ICD-10-CM | POA: Diagnosis not present

## 2021-01-14 DIAGNOSIS — I872 Venous insufficiency (chronic) (peripheral): Secondary | ICD-10-CM | POA: Diagnosis not present

## 2021-01-15 DIAGNOSIS — R0902 Hypoxemia: Secondary | ICD-10-CM | POA: Diagnosis not present

## 2021-01-15 DIAGNOSIS — J449 Chronic obstructive pulmonary disease, unspecified: Secondary | ICD-10-CM | POA: Diagnosis not present

## 2021-01-16 DIAGNOSIS — J439 Emphysema, unspecified: Secondary | ICD-10-CM | POA: Diagnosis not present

## 2021-01-16 DIAGNOSIS — J449 Chronic obstructive pulmonary disease, unspecified: Secondary | ICD-10-CM | POA: Diagnosis not present

## 2021-01-20 DIAGNOSIS — Z87891 Personal history of nicotine dependence: Secondary | ICD-10-CM | POA: Diagnosis not present

## 2021-01-20 DIAGNOSIS — I252 Old myocardial infarction: Secondary | ICD-10-CM | POA: Diagnosis not present

## 2021-01-20 DIAGNOSIS — I4891 Unspecified atrial fibrillation: Secondary | ICD-10-CM | POA: Diagnosis not present

## 2021-01-20 DIAGNOSIS — M6281 Muscle weakness (generalized): Secondary | ICD-10-CM | POA: Diagnosis not present

## 2021-01-20 DIAGNOSIS — L97921 Non-pressure chronic ulcer of unspecified part of left lower leg limited to breakdown of skin: Secondary | ICD-10-CM | POA: Diagnosis not present

## 2021-01-20 DIAGNOSIS — J449 Chronic obstructive pulmonary disease, unspecified: Secondary | ICD-10-CM | POA: Diagnosis not present

## 2021-01-20 DIAGNOSIS — I509 Heart failure, unspecified: Secondary | ICD-10-CM | POA: Diagnosis not present

## 2021-01-20 DIAGNOSIS — I2510 Atherosclerotic heart disease of native coronary artery without angina pectoris: Secondary | ICD-10-CM | POA: Diagnosis not present

## 2021-01-20 DIAGNOSIS — I872 Venous insufficiency (chronic) (peripheral): Secondary | ICD-10-CM | POA: Diagnosis not present

## 2021-01-20 DIAGNOSIS — I110 Hypertensive heart disease with heart failure: Secondary | ICD-10-CM | POA: Diagnosis not present

## 2021-01-22 ENCOUNTER — Telehealth: Payer: Self-pay | Admitting: Pharmacist

## 2021-01-22 NOTE — Progress Notes (Addendum)
    Chronic Care Management Pharmacy Assistant   Name: Erik [name]  MRN: [id number] DOB: [birth year]  Reason for Encounter: PAP Follow-Up  Medications: Outpatient Encounter Medications as of 01/22/2021  Medication Sig   collagenase (SANTYL) ointment Apply 1 application with unna wrap changes   Humidifier MISC Use with O2 at all times   ipratropium-albuterol (DUONEB) 0.5-2.5 (3) MG/3ML SOLN USE 1 VIAL VIA NEBULIZER EVERY 6 HOURS   loratadine (CLARITIN) 10 MG tablet Take 1 tablet (10 mg total) by mouth daily.   losartan (COZAAR) 25 MG tablet Take 1 tablet (25 mg) by mouth once daily   metoprolol tartrate (LOPRESSOR) 25 MG tablet Take 1 tablet (25 mg) by mouth twice daily   OXYGEN Inhale 2-3 L into the lungs daily.   rosuvastatin (CRESTOR) 10 MG tablet SMARTSIG:1 Tablet(s) By Mouth Every Evening   torsemide (DEMADEX) 10 MG tablet Take 1 tablet (10 mg total) by mouth daily.   TRELEGY ELLIPTA 100-62.5-25 MCG/INH AEPB Inhale 1 puff into the lungs daily.   No facility-administered encounter medications on file as of 01/22/2021.   Spoke with the patient in regards of his patient assistance forms I mailed to him in April of 2022 to see If he needed any assistance with them and/or if he ever received them in the mail. The patient stated he did in fact receive the forms but has decided to not go fourth with the filling them out at this time. I informed him to let us know when he did want to restart the process and we will be able to assist him he voiced understanding.   Folllow-Up:Pharmacist Review  Hulen Luster, RMA Clinical Pharmacist Assistant [phone]

## 2021-01-25 ENCOUNTER — Telehealth: Payer: Self-pay | Admitting: Pharmacist

## 2021-01-25 NOTE — Progress Notes (Addendum)
    Chronic Care Management Pharmacy Assistant   Name: Erik [name]  MRN: [id number] DOB: Jun 20, 1956  Reason for Encounter: Medication Review/Medication Coordination Call  Medications: Outpatient Encounter Medications as of 01/25/2021  Medication Sig   collagenase (SANTYL) ointment Apply 1 application with unna wrap changes   Humidifier MISC Use with O2 at all times   ipratropium-albuterol (DUONEB) 0.5-2.5 (3) MG/3ML SOLN USE 1 VIAL VIA NEBULIZER EVERY 6 HOURS   loratadine (CLARITIN) 10 MG tablet Take 1 tablet (10 mg total) by mouth daily.   losartan (COZAAR) 25 MG tablet Take 1 tablet (25 mg) by mouth once daily   metoprolol tartrate (LOPRESSOR) 25 MG tablet Take 1 tablet (25 mg) by mouth twice daily   OXYGEN Inhale 2-3 L into the lungs daily.   rosuvastatin (CRESTOR) 10 MG tablet SMARTSIG:1 Tablet(s) By Mouth Every Evening   torsemide (DEMADEX) 10 MG tablet Take 1 tablet (10 mg total) by mouth daily.   TRELEGY ELLIPTA 100-62.5-25 MCG/INH AEPB Inhale 1 puff into the lungs daily.   No facility-administered encounter medications on file as of 01/25/2021.   Reviewed chart for medication changes ahead of medication coordination call.  No OVs, Consults, or hospital visits since last care coordination call.  No medication changes indicated.  BP Readings from Last 3 Encounters:  12/08/20 130/79  11/16/20 120/80  11/03/20 (!) 138/92    Lab Results  Component Value Date   HGBA1C 6.3 07/19/2013     Patient obtains medications through Vials  30 Days   Last adherence delivery included:  Trelegy Aer 100 mg Loratadine 10 mg  1 tablet daily   Patient declined meds last month: Torsemide 10 mg 1 tablet daily (enough on hand from previous pharmacy) Rosuvastatin 10 mg 1 tablet daily ( enough on hand from previous pharmacy) Metoprolol Tar 25 mg 2 tablets for breakfast and evening (enough on hand from previous pharmacy) Losartan 25 mg 1/2 tablet daily ( Patient stated he's not taking  it) Eliquis 5 mg ( Patient does not want to take) Spironolactone 25 mg (Patient does not want to take this right now) Aspirin 81 mg 1 tablet daily (OTC) Ipratropium 0.5 mg (enough on hand)  Third unsuccessful telephone outreach was attempted today. The patient was referred to the pharmacist for assistance with care management and care coordination.   Follow-Up:Pharmacist Review  Hulen Luster, RMA Clinical Pharmacist Assistant [phone]

## 2021-01-26 ENCOUNTER — Ambulatory Visit: Payer: Medicare Other | Admitting: Nurse Practitioner

## 2021-01-29 DIAGNOSIS — I252 Old myocardial infarction: Secondary | ICD-10-CM | POA: Diagnosis not present

## 2021-01-29 DIAGNOSIS — I110 Hypertensive heart disease with heart failure: Secondary | ICD-10-CM | POA: Diagnosis not present

## 2021-01-29 DIAGNOSIS — I2510 Atherosclerotic heart disease of native coronary artery without angina pectoris: Secondary | ICD-10-CM | POA: Diagnosis not present

## 2021-01-29 DIAGNOSIS — I4891 Unspecified atrial fibrillation: Secondary | ICD-10-CM | POA: Diagnosis not present

## 2021-01-29 DIAGNOSIS — I509 Heart failure, unspecified: Secondary | ICD-10-CM | POA: Diagnosis not present

## 2021-01-29 DIAGNOSIS — M6281 Muscle weakness (generalized): Secondary | ICD-10-CM | POA: Diagnosis not present

## 2021-01-29 DIAGNOSIS — L97921 Non-pressure chronic ulcer of unspecified part of left lower leg limited to breakdown of skin: Secondary | ICD-10-CM | POA: Diagnosis not present

## 2021-01-29 DIAGNOSIS — I872 Venous insufficiency (chronic) (peripheral): Secondary | ICD-10-CM | POA: Diagnosis not present

## 2021-01-29 DIAGNOSIS — Z87891 Personal history of nicotine dependence: Secondary | ICD-10-CM | POA: Diagnosis not present

## 2021-01-29 DIAGNOSIS — J449 Chronic obstructive pulmonary disease, unspecified: Secondary | ICD-10-CM | POA: Diagnosis not present

## 2021-02-04 DIAGNOSIS — I509 Heart failure, unspecified: Secondary | ICD-10-CM | POA: Diagnosis not present

## 2021-02-04 DIAGNOSIS — J449 Chronic obstructive pulmonary disease, unspecified: Secondary | ICD-10-CM | POA: Diagnosis not present

## 2021-02-04 DIAGNOSIS — I10 Essential (primary) hypertension: Secondary | ICD-10-CM | POA: Diagnosis not present

## 2021-02-04 DIAGNOSIS — I2510 Atherosclerotic heart disease of native coronary artery without angina pectoris: Secondary | ICD-10-CM | POA: Diagnosis not present

## 2021-02-04 DIAGNOSIS — M6281 Muscle weakness (generalized): Secondary | ICD-10-CM | POA: Diagnosis not present

## 2021-02-04 DIAGNOSIS — I110 Hypertensive heart disease with heart failure: Secondary | ICD-10-CM | POA: Diagnosis not present

## 2021-02-04 DIAGNOSIS — L97921 Non-pressure chronic ulcer of unspecified part of left lower leg limited to breakdown of skin: Secondary | ICD-10-CM | POA: Diagnosis not present

## 2021-02-04 DIAGNOSIS — I872 Venous insufficiency (chronic) (peripheral): Secondary | ICD-10-CM | POA: Diagnosis not present

## 2021-02-04 DIAGNOSIS — Z87891 Personal history of nicotine dependence: Secondary | ICD-10-CM | POA: Diagnosis not present

## 2021-02-04 DIAGNOSIS — I252 Old myocardial infarction: Secondary | ICD-10-CM | POA: Diagnosis not present

## 2021-02-04 DIAGNOSIS — I4891 Unspecified atrial fibrillation: Secondary | ICD-10-CM | POA: Diagnosis not present

## 2021-02-10 ENCOUNTER — Telehealth: Payer: Self-pay

## 2021-02-10 DIAGNOSIS — I252 Old myocardial infarction: Secondary | ICD-10-CM | POA: Diagnosis not present

## 2021-02-10 DIAGNOSIS — I2510 Atherosclerotic heart disease of native coronary artery without angina pectoris: Secondary | ICD-10-CM | POA: Diagnosis not present

## 2021-02-10 DIAGNOSIS — I110 Hypertensive heart disease with heart failure: Secondary | ICD-10-CM | POA: Diagnosis not present

## 2021-02-10 DIAGNOSIS — L97921 Non-pressure chronic ulcer of unspecified part of left lower leg limited to breakdown of skin: Secondary | ICD-10-CM | POA: Diagnosis not present

## 2021-02-10 DIAGNOSIS — I509 Heart failure, unspecified: Secondary | ICD-10-CM | POA: Diagnosis not present

## 2021-02-10 DIAGNOSIS — M6281 Muscle weakness (generalized): Secondary | ICD-10-CM | POA: Diagnosis not present

## 2021-02-10 DIAGNOSIS — I872 Venous insufficiency (chronic) (peripheral): Secondary | ICD-10-CM | POA: Diagnosis not present

## 2021-02-10 DIAGNOSIS — Z87891 Personal history of nicotine dependence: Secondary | ICD-10-CM | POA: Diagnosis not present

## 2021-02-10 DIAGNOSIS — J449 Chronic obstructive pulmonary disease, unspecified: Secondary | ICD-10-CM | POA: Diagnosis not present

## 2021-02-10 DIAGNOSIS — I4891 Unspecified atrial fibrillation: Secondary | ICD-10-CM | POA: Diagnosis not present

## 2021-02-10 NOTE — Progress Notes (Deleted)
Chronic Care Management Pharmacy Note  02/10/2021 Name:  Erik [name] MRN:  [id number] DOB:  [birth year]  Subjective: Erik [name] is an 65 y.o. year old male who is a primary patient of Humphrey Rolls, Timoteo Gaul, MD.  The CCM team was consulted for assistance with disease management and care coordination needs.    Engaged with patient face to face for initial visit in response to provider referral for pharmacy case management and/or care coordination services.   Consent to Services:  The patient was given the following information about Chronic Care Management services today, agreed to services, and gave verbal consent: 1. CCM service includes personalized support from designated clinical staff supervised by the primary care provider, including individualized plan of care and coordination with other care providers 2. 24/7 contact phone numbers for assistance for urgent and routine care needs. 3. Service will only be billed when office clinical staff spend 20 minutes or more in a month to coordinate care. 4. Only one practitioner may furnish and bill the service in a calendar month. 5.The patient may stop CCM services at any time (effective at the end of the month) by phone call to the office staff. 6. The patient will be responsible for cost sharing (co-pay) of up to 20% of the service fee (after annual deductible is met). Patient agreed to services and consent obtained.  Patient Care Team: Lavera Guise, MD as PCP - General (Internal Medicine) Wellington Hampshire, MD as PCP - Cardiology (Cardiology) Edythe Clarity, [facility] as Pharmacist (Pharmacist)  Recent office visits: 10/06/20 [facility]) - Started on gabapentin 130m tid for complaints of arthritis and other pain  08/25/20 (Humphrey Rolls - televisit complains of dry mouth.  Added humidifier to see if this helps with air in home. Recent consult visits: 09/29/20 ([facility]) - Metoprolol 27mtwice daily started for HF - plan to switch to Toprol XL at  follow up.  Also started on Eliquis 56m63mwice daily ith CHADS2VASc of 4.  Hospital visits: None in previous 6 months  Objective:  Lab Results  Component Value Date   CREATININE 0.80 11/02/2020   BUN 13 11/02/2020   GFRNONAA >60 11/02/2020   GFRAA 105 02/19/2020   NA 137 11/02/2020   K 4.4 11/02/2020   CALCIUM 9.1 11/02/2020   CO2 27 11/02/2020   GLUCOSE 101 (H) 11/02/2020    Lab Results  Component Value Date/Time   HGBA1C 6.3 07/19/2013 04:17 AM    Last diabetic Eye exam: No results found for: HMDIABEYEEXA  Last diabetic Foot exam: No results found for: HMDIABFOOTEX   Lab Results  Component Value Date   CHOL 223 (H) 11/02/2020   HDL 57 11/02/2020   LDLCALC 149 (H) 11/02/2020   LDLDIRECT 140.3 (H) 11/02/2020   TRIG 83 11/02/2020   CHOLHDL 3.9 11/02/2020    Hepatic Function Latest Ref Rng & Units 02/19/2020 07/19/2013 07/18/2013  Total Protein 6.0 - 8.5 g/dL 6.3 6.6 7.5  Albumin 3.8 - 4.8 g/dL 4.2 2.8(L) 3.5  AST 0 - 40 IU/L 14 33 22  ALT 0 - 44 IU/L 12 32 32  Alk Phosphatase 48 - 121 IU/L 81 85 89  Total Bilirubin 0.0 - 1.2 mg/dL 0.4 1.6(H) 1.3(H)    Lab Results  Component Value Date/Time   TSH 1.380 02/19/2020 12:00 AM   TSH 0.643 07/19/2013 04:17 AM   FREET4 1.39 02/19/2020 12:00 AM    CBC Latest Ref Rng & Units 11/02/2020 02/19/2020 08/14/2017  WBC 4.0 -  10.5 K/uL 8.5 8.5 8.3  Hemoglobin 13.0 - 17.0 g/dL 15.2 15.1 16.4  Hematocrit 39.0 - 52.0 % 45.8 45.6 50.3  Platelets 150 - 400 K/uL 350 345 329    No results found for: VD25OH  Clinical ASCVD: Yes  The 10-year ASCVD risk score Mikey Bussing DC Jr., et al., 2013) is: 16.3%   Values used to calculate the score:     Age: 42 years     Sex: Male     Is Non-Hispanic African American: No     Diabetic: No     Tobacco smoker: No     Systolic Blood Pressure: 177 mmHg     Is BP treated: Yes     HDL Cholesterol: 57 mg/dL     Total Cholesterol: 223 mg/dL    Depression screen [facility] 2/9 08/25/2020 05/14/2020 03/31/2020   Decreased Interest 0 0 3  Down, Depressed, Hopeless 0 0 3  PHQ - 2 Score 0 0 6      Social History   Tobacco Use  Smoking Status Former   Years: 25.00   Pack years: 0.00   Types: Cigarettes   Quit date: 03/20/1997   Years since quitting: 23.9  Smokeless Tobacco Never   BP Readings from Last 3 Encounters:  12/08/20 130/79  11/16/20 120/80  11/03/20 (!) 138/92   Pulse Readings from Last 3 Encounters:  12/08/20 96  11/16/20 72  11/03/20 69   Wt Readings from Last 3 Encounters:  11/16/20 212 lb (96.2 kg)  11/03/20 227 lb (103 kg)  11/02/20 227 lb (103 kg)   BMI Readings from Last 3 Encounters:  11/16/20 30.42 kg/m  11/03/20 32.57 kg/m  11/02/20 32.57 kg/m    Assessment/Interventions: Review of patient past medical history, allergies, medications, health status, including review of consultants reports, laboratory and other test data, was performed as part of comprehensive evaluation and provision of chronic care management services.   SDOH:  (Social Determinants of Health) assessments and interventions performed: Yes  SDOH Screenings   Alcohol Screen: Low Risk    Last Alcohol Screening Score (AUDIT): 0  Depression (PHQ2-9): Low Risk    PHQ-2 Score: 0  Financial Resource Strain: Low Risk    Difficulty of Paying Living Expenses: Not hard at all  Food Insecurity: Not on file  Housing: Not on file  Physical Activity: Not on file  Social Connections: Not on file  Stress: Not on file  Tobacco Use: Medium Risk   Smoking Tobacco Use: Former   Smokeless Tobacco Use: Never  Transportation Needs: Not on file    [place]  Allergies  Allergen Reactions   Benadryl [Diphenhydramine Hcl] Shortness Of Breath   Morphine And Related     Medications Reviewed Today     Reviewed by Kris Hartmann, NP (Nurse Practitioner) on 12/14/20 at [place] List Status: <None>   Medication Order Taking? Sig Documenting Provider Last Dose Status Informant  collagenase  (SANTYL) ointment [id number] Yes Apply 1 application with unna wrap changes Kris Hartmann, NP Taking Active   Humidifier MISC [id number] Yes Use with O2 at all times Lavera Guise, MD Taking Active   ipratropium-albuterol (DUONEB) 0.5-2.5 (3) MG/3ML Bailey Mech [id number] Yes USE 1 VIAL VIA NEBULIZER EVERY 6 HOURS Luiz Ochoa, NP Taking Active   loratadine (CLARITIN) 10 MG tablet [id number] Yes Take 1 tablet (10 mg total) by mouth daily. Luiz Ochoa, NP Taking Active   losartan (COZAAR) 25 MG tablet [id number] Yes Take 1  tablet (25 mg) by mouth once daily [provider] Taking Active   metoprolol tartrate (LOPRESSOR) 25 MG tablet [id number] Yes Take 1 tablet (25 mg) by mouth twice daily [provider] Taking Active   OXYGEN [id number] Yes Inhale 2-3 L into the lungs daily. [provider] Taking Active   rosuvastatin (CRESTOR) 10 MG tablet [id number] Yes SMARTSIG:1 Tablet(s) By Mouth Every Evening [provider] Taking Active   torsemide (DEMADEX) 10 MG tablet [id number] Yes Take 1 tablet (10 mg total) by mouth daily. Loel Dubonnet, NP Taking Active   TRELEGY ELLIPTA 100-62.5-25 MCG/INH AEPB [id number] Yes Inhale 1 puff into the lungs daily. Luiz Ochoa, NP Taking Active             Patient Active Problem List   Diagnosis Date Noted   Atherosclerosis of native arteries of extremity with intermittent claudication ([place]) 06/01/2020   Chronic venous insufficiency 06/01/2020   Dorsalgia 09/11/2017   Occlusion and stenosis of bilateral carotid arteries 09/11/2017   Pulmonic heart disease ([place]) 09/11/2017   COPD (chronic obstructive pulmonary disease) ([place]) 08/14/2017   Chronic a-fib ([place]) 03/27/2015   CAD (coronary artery disease) 03/02/2015   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   Essential hypertension with goal blood pressure less than 130/80 02/23/2015    Immunization History  Administered Date(s) Administered   Influenza,inj,Quad PF,6+ Mos  08/15/2017    Conditions to be addressed/monitored:  Afib, HTN, COPD, HLD/CAD  There are no care plans that you recently modified to display for this patient.    Medication Assistance:  Process for patient assistance applications for Dailiresp, Trelegy, and Eliquis started.  Patient's preferred pharmacy is:  [id number] [address], [place] AT [facility] OF [address] & [address] [place] [address] Phone: [phone] Fax: [phone]  Uses pill box? No - has one but does not use it. Pt endorses 90% compliance  We discussed: Benefits of medication synchronization, packaging and delivery as well as enhanced pharmacist oversight with [facility]. Patient decided to: Utilize [facility] pharmacy for medication synchronization, packaging and delivery  Verbal consent obtained for [facility] enhanced pharmacy services (medication synchronization, adherence packaging, delivery coordination). A medication sync plan was created to allow patient to get all medications delivered once every 30 to 90 days per patient preference. Patient understands they have freedom to choose pharmacy and clinical pharmacist will coordinate care between all prescribers and [facility].   Care Plan and Follow Up Patient Decision:  Patient agrees to Care Plan and Follow-up.  Plan: The care management team will reach out to the patient again over the next 90 days.  Beverly Milch, PharmD Clinical Pharmacist [facility] [phone]    Current Barriers:  Unable to independently afford treatment regimen Unable to achieve control of cholesterol  Confusion of medication administration.   Pharmacist Clinical Goal(s):  Patient will verbalize ability to afford treatment regimen achieve control of cholesterol as evidenced by lipid panel maintain control of blood pressure as evidenced by home monitoring  achieve ability to self administer  medications as prescribed through use of pill packs as evidenced by patient report through collaboration with PharmD and provider.   Interventions: 1:1 collaboration with Lavera Guise, MD regarding development and update of comprehensive plan of care as evidenced by provider attestation and co-signature Inter-disciplinary care team collaboration (see longitudinal plan of care) Comprehensive medication review performed; medication list updated in electronic medical record  Hypertension (  BP goal <130/80) -Controlled -Current treatment: Losartan 12.59m daily Metoprolol 276mtwo tablets twice daily -Medications previously tried: Diltiazem (d/c)  -Current home readings: no specific readings available, patient has cuff at home and reports it has always been normal  -Current exercise habits: minimal -Denies hypotensive/hypertensive symptoms -Educated on BP goals and benefits of medications for prevention of heart attack, stroke and kidney damage; Importance of home blood pressure monitoring; Symptoms of hypotension and importance of maintaining adequate hydration; Use caution when going from sitting to standing -Counseled to monitor BP at home daily, document, and provide log at future appointments -Recommended to continue current medication Counseled on new medication starts and which medication to d/c   -Patient had not picked up losartan yet but was aware of change.  Hyperlipidemia/CAD: (LDL goal < 70) -Uncontrolled -Current treatment: Rosuvastatin 1075maily -Medications previously tried: pravastatin   -Current exercise habits: minimal -Educated on Cholesterol goals;  Benefits of statin for ASCVD risk reduction; Importance of limiting foods high in cholesterol;  -Had not picked up newly prescribed statin - aware of change and plans to pick up. -Recommended to continue current medication Educated on benefits of taking at bedtime  Atrial Fibrillation (Goal: prevent stroke and  major bleeding) -Controlled -CHADSVASC: 4 -Current treatment: Rate control: Metoprolol tartrate 14m35mo tablets daily Anticoagulation: Eliquis 5mg 10m -Medications previously tried: Diltiazem CD 180 -Home BP and HR readings: no specifics available - patient reports always WNL  -Counseled on increased risk of stroke due to Afib and benefits of anticoagulation for stroke prevention; bleeding risk associated with Eliquis and importance of self-monitoring for signs/symptoms of bleeding; Importance of taking as directed - avoidance of NSAIDs -Recommended to continue current medication Assessed patient finances. He reports copay is manageable currently, however with amount of brand name medications I fear donut hole is in the future.  Will look into PAP programs as he is only on SSI.  COPD (Goal: control symptoms and prevent exacerbations) -Controlled -Current treatment  Duoneb 0.5-2.5 mg/3ml p36mTrelegy 100-62.5-14mcg.inh -Medications previously tried: none noted   -Exacerbations requiring treatment in last 6 months: none -Patient denies consistent use of maintenance inhaler -Frequency of rescue inhaler use: as needed -He reports sometimes he does not use his Trelegy daily, mainly due to cost concerns and trying to make it last longer.  Also cannot afford Daliresp due to copay.  He would really benefit from using maintenance inhaler daily.   -Counseled on Proper inhaler technique; Benefits of consistent maintenance inhaler use -Recommended to continue current medication Assessed patient finances. Will look into PAP programs for both Dailiresp and Trelegy.   Patient Goals/Self-Care Activities Patient will:  - take medications as prescribed check blood pressure daily, document, and provide at future appointments collaborate with provider on medication access solutions  Follow Up Plan: The care management team will reach out to the patient again over the next 90 days.

## 2021-02-14 DIAGNOSIS — J449 Chronic obstructive pulmonary disease, unspecified: Secondary | ICD-10-CM | POA: Diagnosis not present

## 2021-02-14 DIAGNOSIS — R0902 Hypoxemia: Secondary | ICD-10-CM | POA: Diagnosis not present

## 2021-02-15 ENCOUNTER — Telehealth: Payer: Self-pay | Admitting: *Deleted

## 2021-02-15 DIAGNOSIS — J449 Chronic obstructive pulmonary disease, unspecified: Secondary | ICD-10-CM | POA: Diagnosis not present

## 2021-02-15 DIAGNOSIS — J439 Emphysema, unspecified: Secondary | ICD-10-CM | POA: Diagnosis not present

## 2021-02-15 NOTE — Telephone Encounter (Signed)
-----   Message from [facility], CPhT sent at 02/15/2021 11:33 AM EDT ----- Regarding: Medication Adherence Hello-My name is Maxine Glenn and I am a Engineer, materials with [facility]  that is assigned to your practice to ensure the patients are staying adherent with prescribed medications that fall into the Medicare adherence measures. This PT has two medications that I would like to make you aware of. This PT has not filled Losartan or Rosuvastatin since 12/01/20-30 day supplies. I verified with PT pharmacy( [facility]) and then reached out to Erik [name]. He states to me that he is not taking either of these medications. He states he did not like the way they made him feel and states he was breaking out in whelps and that he is unsure which med caused what reactions/side effects etc. I asked him if he had made your office aware that he had stopped taking and he said he told the office and was just told it was normal If PT  doesn't fill either med he will fail the measures by 02/27/21 for the 2022 year, these medications are triple weighted-so if you are ok with the PT not taking these I am asking that your office discontinue and remove from med list and let pt pharmacy know they have been discontinued. This will remove him from the measure and he should not fall into the measure for next year unless he is prescribed another med that falls into either of the three measures that I monitor. Please let me know if you have any questions or concerns-My phone number is [phone].

## 2021-02-15 NOTE — Telephone Encounter (Signed)
[name], Erik L - 02/15/2021 12:25 PM Alver Sorrow, NP  Sent: Mon February 15, 2021 12:34 PM  To: Jefferey Pica, RN; Iran Ouch, MD  Cc: Jarvis Newcomer, RN; P C[facility] Div [facility] Scheduling          Message  He did share with me via phone call that he would like to remain off Spironolactone - okay to discontinue and remove from medication list. He should continue Losartan and Metoprolol given his atrial fibrillation, hypertension, and systolic heart failure. He has previously declined multiple medications including anticoagulation in the setting of atrial fibrillation and CHADS2VASc of at least 3.     I have CC'd scheduling team to get him an appointment with Dr. Kirke Corin or APP for discussion of medications, HF follow up.    I will route to [facility], CPhT who initially informed us as a community message so she and her team are aware.    [place],  Alver Sorrow, NP

## 2021-02-15 NOTE — Telephone Encounter (Signed)
This is a patient of Dr. Jari Sportsman. He last saw Gillian Shields, NP on 11/16/20.   I spoke with him on 12/01/20 as part of triage.   See 12/01/20 phone note regarding the patient's medications.  To Dr. Modesta Messing, NP.   He no showed for his follow up appointment with Luther Parody, NP on 12/21/20.

## 2021-02-15 NOTE — Telephone Encounter (Signed)
Spoke with patient and he has not been taking losartan. He reports only taking metoprolol and torsemide. Reviewed reasons for taking these medications but he states that he is just not going to take those. Confirmed upcoming appointment and requested that he please bring in his medication bottles so we can update his chart. He verbalized understanding with no further questions.

## 2021-02-17 ENCOUNTER — Telehealth: Payer: Self-pay | Admitting: Pharmacist

## 2021-02-17 ENCOUNTER — Telehealth (INDEPENDENT_AMBULATORY_CARE_PROVIDER_SITE_OTHER): Payer: Self-pay

## 2021-02-17 DIAGNOSIS — I872 Venous insufficiency (chronic) (peripheral): Secondary | ICD-10-CM | POA: Diagnosis not present

## 2021-02-17 DIAGNOSIS — M6281 Muscle weakness (generalized): Secondary | ICD-10-CM | POA: Diagnosis not present

## 2021-02-17 DIAGNOSIS — I509 Heart failure, unspecified: Secondary | ICD-10-CM | POA: Diagnosis not present

## 2021-02-17 DIAGNOSIS — I110 Hypertensive heart disease with heart failure: Secondary | ICD-10-CM | POA: Diagnosis not present

## 2021-02-17 DIAGNOSIS — I2510 Atherosclerotic heart disease of native coronary artery without angina pectoris: Secondary | ICD-10-CM | POA: Diagnosis not present

## 2021-02-17 DIAGNOSIS — J449 Chronic obstructive pulmonary disease, unspecified: Secondary | ICD-10-CM | POA: Diagnosis not present

## 2021-02-17 DIAGNOSIS — I4891 Unspecified atrial fibrillation: Secondary | ICD-10-CM | POA: Diagnosis not present

## 2021-02-17 DIAGNOSIS — I252 Old myocardial infarction: Secondary | ICD-10-CM | POA: Diagnosis not present

## 2021-02-17 DIAGNOSIS — L97921 Non-pressure chronic ulcer of unspecified part of left lower leg limited to breakdown of skin: Secondary | ICD-10-CM | POA: Diagnosis not present

## 2021-02-17 DIAGNOSIS — Z87891 Personal history of nicotine dependence: Secondary | ICD-10-CM | POA: Diagnosis not present

## 2021-02-17 NOTE — Telephone Encounter (Signed)
Placed Rx on your desk

## 2021-02-17 NOTE — Telephone Encounter (Signed)
[place] Nurse with [facility] called and left a VM on the nurses line wanting to know could she have verbal orders to change the wound care to hydropheri blue and foam dressing due to the pt saying the wraps make the wound worse. Please advise.

## 2021-02-17 NOTE — Telephone Encounter (Signed)
Do you still have this Rx?

## 2021-02-17 NOTE — Telephone Encounter (Signed)
Do you still have the Rx?

## 2021-02-17 NOTE — Telephone Encounter (Signed)
I spoke to the NP and she made me aware that it is ok for the home care nurse to to change the orders to the above I called the nurse an made her aware.

## 2021-02-17 NOTE — Progress Notes (Addendum)
    Chronic Care Management Pharmacy Assistant   Name: Erik [name]  MRN: [id number] DOB: Jul 14, 1956  Reason for Encounter: Medication Review/Medication Coordination Call.  Medications: Outpatient Encounter Medications as of 02/17/2021  Medication Sig   collagenase (SANTYL) ointment Apply 1 application with unna wrap changes   Humidifier MISC Use with O2 at all times   ipratropium-albuterol (DUONEB) 0.5-2.5 (3) MG/3ML SOLN USE 1 VIAL VIA NEBULIZER EVERY 6 HOURS   loratadine (CLARITIN) 10 MG tablet Take 1 tablet (10 mg total) by mouth daily.   losartan (COZAAR) 25 MG tablet Take 1 tablet (25 mg) by mouth once daily (Patient not taking: Reported on 02/15/2021)   metoprolol tartrate (LOPRESSOR) 25 MG tablet Take 1 tablet (25 mg) by mouth twice daily   OXYGEN Inhale 2-3 L into the lungs daily.   rosuvastatin (CRESTOR) 10 MG tablet SMARTSIG:1 Tablet(s) By Mouth Every Evening (Patient not taking: Reported on 02/15/2021)   torsemide (DEMADEX) 10 MG tablet Take 1 tablet (10 mg total) by mouth daily.   TRELEGY ELLIPTA 100-62.5-25 MCG/INH AEPB Inhale 1 puff into the lungs daily.   No facility-administered encounter medications on file as of 02/17/2021.   Reviewed chart for medication changes ahead of medication coordination call.  No OVs, Consults, or hospital visits since last care coordination call.  No medication changes.  BP Readings from Last 3 Encounters:  12/08/20 130/79  11/16/20 120/80  11/03/20 (!) 138/92    Lab Results  Component Value Date   HGBA1C 6.3 07/19/2013     Patient obtains medications through Vials  30 Days   Last adherence delivery included:  Trelegy Aer 100 mg Loratadine 10 mg  1 tablet daily   Patient declined meds last month: (Unable to reach the patient) Trelegy Aer 100 mg Loratadine 10 mg  1 tablet daily  Torsemide 10 mg 1 tablet daily (enough on hand from previous pharmacy) Rosuvastatin 10 mg 1 tablet daily ( enough on hand from previous  pharmacy) Metoprolol Tar 25 mg 2 tablets for breakfast and evening (enough on hand from previous pharmacy) Losartan 25 mg 1/2 tablet daily ( Patient stated he's not taking it) Eliquis 5 mg ( Patient does not want to take) Spironolactone 25 mg (Patient does not want to take this right now) Aspirin 81 mg 1 tablet daily (OTC) Ipratropium 0.5 mg (enough on hand)  Called patient and reviewed medications.  This delivery to include: None  Patient declined the following medications: (Patient stated he has enough medication on hand) Trelegy Aer 100 mg Loratadine 10 mg  1 tablet daily  Torsemide 10 mg 1 tablet daily (enough on hand from previous pharmacy) Rosuvastatin 10 mg 1 tablet daily ( enough on hand from previous pharmacy) Metoprolol Tar 25 mg 2 tablets for breakfast and evening (enough on hand from previous pharmacy) Losartan 25 mg 1/2 tablet daily ( Patient stated he's not taking it) Eliquis 5 mg ( Patient does not want to take) Spironolactone 25 mg (Patient does not want to take this right now) Aspirin 81 mg 1 tablet daily (OTC) Ipratropium 0.5 mg (enough on hand) Patient does not need refills at this time.   Follow-Up:Pharmacist Review  Hulen Luster, RMA Clinical Pharmacist Assistant [phone]  10 minutes spent in review, coordination, and documentation.  Reviewed by: Willa Frater, PharmD Clinical Pharmacist [facility] [phone]

## 2021-02-19 DIAGNOSIS — L97909 Non-pressure chronic ulcer of unspecified part of unspecified lower leg with unspecified severity: Secondary | ICD-10-CM | POA: Diagnosis not present

## 2021-02-22 ENCOUNTER — Ambulatory Visit: Payer: Medicare Other | Admitting: Physician Assistant

## 2021-02-22 DIAGNOSIS — J449 Chronic obstructive pulmonary disease, unspecified: Secondary | ICD-10-CM | POA: Diagnosis not present

## 2021-02-26 DIAGNOSIS — I252 Old myocardial infarction: Secondary | ICD-10-CM | POA: Diagnosis not present

## 2021-02-26 DIAGNOSIS — L97921 Non-pressure chronic ulcer of unspecified part of left lower leg limited to breakdown of skin: Secondary | ICD-10-CM | POA: Diagnosis not present

## 2021-02-26 DIAGNOSIS — Z87891 Personal history of nicotine dependence: Secondary | ICD-10-CM | POA: Diagnosis not present

## 2021-02-26 DIAGNOSIS — J449 Chronic obstructive pulmonary disease, unspecified: Secondary | ICD-10-CM | POA: Diagnosis not present

## 2021-02-26 DIAGNOSIS — I110 Hypertensive heart disease with heart failure: Secondary | ICD-10-CM | POA: Diagnosis not present

## 2021-02-26 DIAGNOSIS — I509 Heart failure, unspecified: Secondary | ICD-10-CM | POA: Diagnosis not present

## 2021-02-26 DIAGNOSIS — I4891 Unspecified atrial fibrillation: Secondary | ICD-10-CM | POA: Diagnosis not present

## 2021-02-26 DIAGNOSIS — M6281 Muscle weakness (generalized): Secondary | ICD-10-CM | POA: Diagnosis not present

## 2021-02-26 DIAGNOSIS — I2510 Atherosclerotic heart disease of native coronary artery without angina pectoris: Secondary | ICD-10-CM | POA: Diagnosis not present

## 2021-02-26 DIAGNOSIS — I872 Venous insufficiency (chronic) (peripheral): Secondary | ICD-10-CM | POA: Diagnosis not present

## 2021-03-04 ENCOUNTER — Ambulatory Visit: Payer: Medicare Other | Admitting: Physician Assistant

## 2021-03-04 DIAGNOSIS — I509 Heart failure, unspecified: Secondary | ICD-10-CM | POA: Diagnosis not present

## 2021-03-04 DIAGNOSIS — I110 Hypertensive heart disease with heart failure: Secondary | ICD-10-CM | POA: Diagnosis not present

## 2021-03-04 DIAGNOSIS — I252 Old myocardial infarction: Secondary | ICD-10-CM | POA: Diagnosis not present

## 2021-03-04 DIAGNOSIS — M6281 Muscle weakness (generalized): Secondary | ICD-10-CM | POA: Diagnosis not present

## 2021-03-04 DIAGNOSIS — I2510 Atherosclerotic heart disease of native coronary artery without angina pectoris: Secondary | ICD-10-CM | POA: Diagnosis not present

## 2021-03-04 DIAGNOSIS — L97921 Non-pressure chronic ulcer of unspecified part of left lower leg limited to breakdown of skin: Secondary | ICD-10-CM | POA: Diagnosis not present

## 2021-03-04 DIAGNOSIS — J449 Chronic obstructive pulmonary disease, unspecified: Secondary | ICD-10-CM | POA: Diagnosis not present

## 2021-03-04 DIAGNOSIS — I4891 Unspecified atrial fibrillation: Secondary | ICD-10-CM | POA: Diagnosis not present

## 2021-03-04 DIAGNOSIS — Z87891 Personal history of nicotine dependence: Secondary | ICD-10-CM | POA: Diagnosis not present

## 2021-03-04 DIAGNOSIS — I872 Venous insufficiency (chronic) (peripheral): Secondary | ICD-10-CM | POA: Diagnosis not present

## 2021-03-07 DIAGNOSIS — J449 Chronic obstructive pulmonary disease, unspecified: Secondary | ICD-10-CM | POA: Diagnosis not present

## 2021-03-07 DIAGNOSIS — I10 Essential (primary) hypertension: Secondary | ICD-10-CM | POA: Diagnosis not present

## 2021-03-13 NOTE — Progress Notes (Deleted)
Cardiology Office Note    Date:  03/13/2021   ID:  Erik [name], DOB 1955/09/02, MRN [id number]  PCP:  Lyndon Code, MD  Cardiologist:  Lorine Bears, MD  Electrophysiologist:  None   Chief Complaint: ***  History of Present Illness:   Erik [name] is a 65 y.o. male with history of CAD status post PCI/BMS to OM2 in 2009, HFrEF secondary to ICM, chronic A. fib, chronic hypoxic respiratory failure on home O2 at 2 to 3 L via nasal cannula since 2012, COPD, prior tobacco use quitting in 1997, and HLD who presents for ***  He was previously followed by Dr. Gwen Pounds with [facility] clinic cardiology.  He was admitted in 2014 with mild acute pancreatitis with anticoagulation being discontinued at that time due to adherence issues per prior notes.  There was no significant bleeding event.  Echo at that time demonstrated an EF of 35 to 40%, moderately dilated left atrium, mild tricuspid regurgitation, and mild to moderate mitral regurgitation.  Most recent LHC from 2016 demonstrated a patent OM2 stent with mild in-stent restenosis, chronically occluded proximal RCA with good collaterals, moderate mid LAD stenosis, and mild LCx disease.  Echo in 08/2017 demonstrated an EF of 55 to 60%, dilated left atrium, and no significant valvular abnormalities.  Echo performed at outside office in 03/2020 reported an EF of 40% with septal and anteroapical hypokinesis and mild tricuspid regurgitation.  He establish care with our office in 09/2020.  He reported since having had an upper respiratory tract infection in 07/2020, he did not think it was COVID, he reported worsening exertional dyspnea as well as intermittent tachycardia with heart rates going into the 170s bpm.  He did not note any significant weight gain, though did have lower extremity edema.  Ventricular rates were suboptimally controlled in the 1 teens bpm.  He was started on metoprolol for added rate control and to optimize GDMT.  Due to his  CHA2DS2-VASc of 4 he was initiated on Eliquis.  To further evaluate his dyspnea, he underwent echo in 09/2020 which showed an EF of 40 to 45%, indeterminate LV diastolic function parameters, low normal RV systolic function with normal RV cavity size, severely dilated left atrium, moderately dilated right atrium, degenerative mitral valve with mild regurgitation, mild aortic valve sclerosis without evidence of stenosis, and an estimated right atrial pressure of 3 mmHg.  This was compared to a prior echo performed in [place], [place] with an EF of 49% noted.  In follow-up in 10/2020, diltiazem was discontinued and he was initiated on losartan.  He preferred to think about diagnostic cardiac cath to further evaluate his cardiomyopathy.  He was last seen in the office in 11/2020 and continued to prefer to defer cardiac cath.  His weight was up 15 pounds since his clinic visit 2 weeks prior.  He noted improvement in his lower extremity swelling following the discontinuation of diltiazem.  He was initiated on spironolactone.  Follow-up labs remain pending.  ***   Labs independently reviewed: 10/2020 - potassium 4.4, BUN 13, serum creatinine 0.8, Hgb 15.2, PLT 350, TC 223, TG 83, HDL 57, LDL 149, direct LDL 140 02/2020 - albumin 4.2, AST/ALT normal, TSH normal  Past Medical History:  Diagnosis Date   Arthritis    Asthma    Atrial fibrillation (HCC)    CHF (congestive heart failure) (HCC)    COPD (chronic obstructive pulmonary disease) (HCC)    Coronary artery disease    GERD (gastroesophageal  reflux disease)    Hypertension    Myocardial infarction [facility])    Shortness of breath dyspnea     Past Surgical History:  Procedure Laterality Date   ANGIOPLASTY  2009   CARDIAC CATHETERIZATION N/A 03/18/2015   Procedure: Left Heart Cath with Coronary Angiography;  Surgeon: Lamar Blinks, MD;  Location: [facility] INVASIVE CV LAB;  Service: Cardiovascular;  Laterality: N/A;   CHOLECYSTECTOMY  2005   CORONARY ANGIOGRAM   2009    Current Medications: No outpatient medications have been marked as taking for the 03/19/21 encounter (Appointment) with Sondra Barges, PA-C.    Allergies:   Benadryl [diphenhydramine hcl] and Morphine and related   Social History   Socioeconomic History   Marital status: Married    Spouse name: Not on file   Number of children: Not on file   Years of education: Not on file   Highest education level: Not on file  Occupational History   Not on file  Tobacco Use   Smoking status: Former    Years: 25.00    Types: Cigarettes    Quit date: 03/20/1997    Years since quitting: 23.9   Smokeless tobacco: Never  Vaping Use   Vaping Use: Never used  Substance and Sexual Activity   Alcohol use: No   Drug use: No   Sexual activity: Not on file  Other Topics Concern   Not on file  Social History Narrative   Not on file   Social Determinants of Health   Financial Resource Strain: Low Risk    Difficulty of Paying Living Expenses: Not hard at all  Food Insecurity: Not on file  Transportation Needs: Not on file  Physical Activity: Not on file  Stress: Not on file  Social Connections: Not on file     Family History:  The patient's family history includes Coronary artery disease in his father; Diabetes in his father; Hyperlipidemia in his father and mother; Hypertension in his father; Stroke in his mother.  ROS:   ROS   EKGs/Labs/Other Studies Reviewed:    Studies reviewed were summarized above. The additional studies were reviewed today:  2D echo 09/30/2020: 1. Left ventricular ejection fraction, by estimation, is 40 to 45%. The  left ventricle has mild to moderately decreased function. Left ventricular  endocardial border not optimally defined to evaluate regional wall motion.  Left ventricular diastolic  parameters are indeterminate.   2. Right ventricular systolic function is low normal. The right  ventricular size is normal.   3. Left atrial size was severely  dilated.   4. Right atrial size was moderately dilated.   5. The mitral valve is degenerative. Mild mitral valve regurgitation.   6. The aortic valve was not well visualized. Aortic valve regurgitation  is not visualized. Mild aortic valve sclerosis is present, with no  evidence of aortic valve stenosis.   7. The inferior vena cava is normal in size with greater than 50%  respiratory variability, suggesting right atrial pressure of 3 mmHg.   Comparison(s): Prior Echo done in [place], [place]  EF 49%.    EKG:  EKG is ordered today.  The EKG ordered today demonstrates ***  Recent Labs: 11/02/2020: BUN 13; Creatinine, Ser 0.80; Hemoglobin 15.2; Platelets 350; Potassium 4.4; Sodium 137  Recent Lipid Panel    Component Value Date/Time   CHOL 223 (H) 11/02/2020 1649   CHOL 184 02/19/2020 0000   CHOL 146 07/18/2013 0934   TRIG 83 11/02/2020 1649  TRIG 53 07/18/2013 0934   HDL 57 11/02/2020 1649   HDL 49 02/19/2020 0000   HDL 48 07/18/2013 0934   CHOLHDL 3.9 11/02/2020 1649   VLDL 17 11/02/2020 1649   VLDL 11 07/18/2013 0934   LDLCALC 149 (H) 11/02/2020 1649   LDLCALC 115 (H) 02/19/2020 0000   LDLCALC 87 07/18/2013 0934   LDLDIRECT 140.3 (H) 11/02/2020 1649    PHYSICAL EXAM:    VS:  There were no vitals taken for this visit.  BMI: There is no height or weight on file to calculate BMI.  Physical Exam  Wt Readings from Last 3 Encounters:  11/16/20 212 lb (96.2 kg)  11/03/20 227 lb (103 kg)  11/02/20 227 lb (103 kg)     ASSESSMENT & PLAN:   CAD involving the native coronary arteries without***angina:  HFrEF secondary to ICM:  Permanent A. fib:  HLD: LDL 140 in 10/2020 with goal LDL being less than 70.  Venous ulcer: Followed by vascular surgery.  Chronic hypoxic respiratory failure/COPD:  Disposition: F/u with Dr. Kirke Corin or an APP in ***.   Medication Adjustments/Labs and Tests Ordered: Current medicines are reviewed at length with the patient today.  Concerns  regarding medicines are outlined above. Medication changes, Labs and Tests ordered today are summarized above and listed in the Patient Instructions accessible in Encounters.   Signed, Eula Listen, PA-C 03/13/2021 11:26 AM     [facility] [place] [address] [place], [place] [address] [phone]

## 2021-03-15 DIAGNOSIS — I4891 Unspecified atrial fibrillation: Secondary | ICD-10-CM | POA: Diagnosis not present

## 2021-03-15 DIAGNOSIS — L97921 Non-pressure chronic ulcer of unspecified part of left lower leg limited to breakdown of skin: Secondary | ICD-10-CM | POA: Diagnosis not present

## 2021-03-15 DIAGNOSIS — I872 Venous insufficiency (chronic) (peripheral): Secondary | ICD-10-CM | POA: Diagnosis not present

## 2021-03-15 DIAGNOSIS — I2510 Atherosclerotic heart disease of native coronary artery without angina pectoris: Secondary | ICD-10-CM | POA: Diagnosis not present

## 2021-03-15 DIAGNOSIS — I252 Old myocardial infarction: Secondary | ICD-10-CM | POA: Diagnosis not present

## 2021-03-15 DIAGNOSIS — M6281 Muscle weakness (generalized): Secondary | ICD-10-CM | POA: Diagnosis not present

## 2021-03-15 DIAGNOSIS — Z87891 Personal history of nicotine dependence: Secondary | ICD-10-CM | POA: Diagnosis not present

## 2021-03-15 DIAGNOSIS — J449 Chronic obstructive pulmonary disease, unspecified: Secondary | ICD-10-CM | POA: Diagnosis not present

## 2021-03-15 DIAGNOSIS — I509 Heart failure, unspecified: Secondary | ICD-10-CM | POA: Diagnosis not present

## 2021-03-15 DIAGNOSIS — I110 Hypertensive heart disease with heart failure: Secondary | ICD-10-CM | POA: Diagnosis not present

## 2021-03-17 DIAGNOSIS — R0902 Hypoxemia: Secondary | ICD-10-CM | POA: Diagnosis not present

## 2021-03-17 DIAGNOSIS — J449 Chronic obstructive pulmonary disease, unspecified: Secondary | ICD-10-CM | POA: Diagnosis not present

## 2021-03-18 DIAGNOSIS — J449 Chronic obstructive pulmonary disease, unspecified: Secondary | ICD-10-CM | POA: Diagnosis not present

## 2021-03-18 DIAGNOSIS — J439 Emphysema, unspecified: Secondary | ICD-10-CM | POA: Diagnosis not present

## 2021-03-19 ENCOUNTER — Ambulatory Visit: Payer: Medicare Other | Admitting: Physician Assistant

## 2021-03-19 ENCOUNTER — Telehealth: Payer: Self-pay | Admitting: Pharmacist

## 2021-03-19 NOTE — Progress Notes (Addendum)
    Chronic Care Management Pharmacy Assistant   Name: Erik [name]  MRN: [id number] DOB: 1956-08-04  Reason for Encounter: Medication Review/Medication Coordination Call   Medications: Outpatient Encounter Medications as of 03/19/2021  Medication Sig   collagenase (SANTYL) ointment Apply 1 application with unna wrap changes   Humidifier MISC Use with O2 at all times   ipratropium-albuterol (DUONEB) 0.5-2.5 (3) MG/3ML SOLN USE 1 VIAL VIA NEBULIZER EVERY 6 HOURS   loratadine (CLARITIN) 10 MG tablet Take 1 tablet (10 mg total) by mouth daily.   losartan (COZAAR) 25 MG tablet Take 1 tablet (25 mg) by mouth once daily (Patient not taking: Reported on 02/15/2021)   metoprolol tartrate (LOPRESSOR) 25 MG tablet Take 1 tablet (25 mg) by mouth twice daily   OXYGEN Inhale 2-3 L into the lungs daily.   rosuvastatin (CRESTOR) 10 MG tablet SMARTSIG:1 Tablet(s) By Mouth Every Evening (Patient not taking: Reported on 02/15/2021)   torsemide (DEMADEX) 10 MG tablet Take 1 tablet (10 mg total) by mouth daily.   TRELEGY ELLIPTA 100-62.5-25 MCG/INH AEPB Inhale 1 puff into the lungs daily.   No facility-administered encounter medications on file as of 03/19/2021.   Reviewed chart for medication changes ahead of medication coordination call.  No OVs, Consults, or hospital visits since last care coordination call.  No medication changes indicated.  BP Readings from Last 3 Encounters:  12/08/20 130/79  11/16/20 120/80  11/03/20 (!) 138/92    Lab Results  Component Value Date   HGBA1C 6.3 07/19/2013     Patient obtains medications through Vials  30 Days   Last adherence delivery included: 12/22/20 Trelegy Aer 100 mg  Loratadine 10 mg  1 tablet daily    Patient declined meds last month: Trelegy Aer 100 mg Loratadine 10 mg  1 tablet daily  Torsemide 10 mg 1 tablet daily (enough on hand from previous pharmacy) Rosuvastatin 10 mg 1 tablet daily ( enough on hand from previous pharmacy) Metoprolol  Tar 25 mg 2 tablets for breakfast and evening (enough on hand from previous pharmacy) Losartan 25 mg 1/2 tablet daily ( Patient stated he's not taking it) Eliquis 5 mg ( Patient does not want to take) Spironolactone 25 mg (Patient does not want to take this right now) Aspirin 81 mg 1 tablet daily (OTC) Ipratropium 0.5 mg (enough on hand)  Patient is due for next adherence delivery on: 04/01/21.  Called patient and reviewed medications and coordinated delivery.  This delivery to include: Trelegy Aer 100 mg Torsemide 10 mg 1 tablet daily  Metoprolol Tar 25 mg 2 tablets for breakfast and evening  Patient declined the following medications: Aspirin 81 mg 1 tablet daily (OTC) Ipratropium 0.5 mg (enough on hand) Eliquis 5 mg (Patient does not want to take this, stated he takes aspirin) Spironolactone 25 mg (Patient does not want to take this) Losartan 25 mg 1/2 tablet daily (Patient does not want to take this) Loratadine 10 mg  1 tablet daily (enough on hand) Rosuvastatin 10 mg 1 tablet daily ( enough on hand from previous pharmacy)  Patient does not need refills at this time.  Confirmed delivery date of 04/01/21, advised patient that pharmacy will contact them the morning of delivery.  Follow-Up:Pharmacist Review  Hulen Luster, RMA Clinical Pharmacist Assistant [phone]  10 minutes spent in review, coordination, and documentation.  Reviewed by: Willa Frater, PharmD Clinical Pharmacist [phone]

## 2021-03-23 DIAGNOSIS — I2510 Atherosclerotic heart disease of native coronary artery without angina pectoris: Secondary | ICD-10-CM | POA: Diagnosis not present

## 2021-03-23 DIAGNOSIS — Z87891 Personal history of nicotine dependence: Secondary | ICD-10-CM | POA: Diagnosis not present

## 2021-03-23 DIAGNOSIS — I509 Heart failure, unspecified: Secondary | ICD-10-CM | POA: Diagnosis not present

## 2021-03-23 DIAGNOSIS — J449 Chronic obstructive pulmonary disease, unspecified: Secondary | ICD-10-CM | POA: Diagnosis not present

## 2021-03-23 DIAGNOSIS — I252 Old myocardial infarction: Secondary | ICD-10-CM | POA: Diagnosis not present

## 2021-03-23 DIAGNOSIS — I4891 Unspecified atrial fibrillation: Secondary | ICD-10-CM | POA: Diagnosis not present

## 2021-03-23 DIAGNOSIS — I110 Hypertensive heart disease with heart failure: Secondary | ICD-10-CM | POA: Diagnosis not present

## 2021-03-23 DIAGNOSIS — I872 Venous insufficiency (chronic) (peripheral): Secondary | ICD-10-CM | POA: Diagnosis not present

## 2021-03-23 DIAGNOSIS — M6281 Muscle weakness (generalized): Secondary | ICD-10-CM | POA: Diagnosis not present

## 2021-03-23 DIAGNOSIS — L97921 Non-pressure chronic ulcer of unspecified part of left lower leg limited to breakdown of skin: Secondary | ICD-10-CM | POA: Diagnosis not present

## 2021-03-31 ENCOUNTER — Telehealth (INDEPENDENT_AMBULATORY_CARE_PROVIDER_SITE_OTHER): Payer: Self-pay

## 2021-03-31 DIAGNOSIS — M6281 Muscle weakness (generalized): Secondary | ICD-10-CM | POA: Diagnosis not present

## 2021-03-31 DIAGNOSIS — I4891 Unspecified atrial fibrillation: Secondary | ICD-10-CM | POA: Diagnosis not present

## 2021-03-31 DIAGNOSIS — I252 Old myocardial infarction: Secondary | ICD-10-CM | POA: Diagnosis not present

## 2021-03-31 DIAGNOSIS — L97921 Non-pressure chronic ulcer of unspecified part of left lower leg limited to breakdown of skin: Secondary | ICD-10-CM | POA: Diagnosis not present

## 2021-03-31 DIAGNOSIS — I509 Heart failure, unspecified: Secondary | ICD-10-CM | POA: Diagnosis not present

## 2021-03-31 DIAGNOSIS — I872 Venous insufficiency (chronic) (peripheral): Secondary | ICD-10-CM | POA: Diagnosis not present

## 2021-03-31 DIAGNOSIS — I2510 Atherosclerotic heart disease of native coronary artery without angina pectoris: Secondary | ICD-10-CM | POA: Diagnosis not present

## 2021-03-31 DIAGNOSIS — I110 Hypertensive heart disease with heart failure: Secondary | ICD-10-CM | POA: Diagnosis not present

## 2021-03-31 DIAGNOSIS — Z87891 Personal history of nicotine dependence: Secondary | ICD-10-CM | POA: Diagnosis not present

## 2021-03-31 DIAGNOSIS — J449 Chronic obstructive pulmonary disease, unspecified: Secondary | ICD-10-CM | POA: Diagnosis not present

## 2021-03-31 NOTE — Telephone Encounter (Signed)
That is fine 

## 2021-03-31 NOTE — Telephone Encounter (Signed)
The pt's Home care nurse called and left a VM on the nurse's line wanting to making [place] aware that the pt now has a wound on his Lt LE . The nurse would like to know could she have a verbal order for hydro phera blue an to use an unna boot for compression. Please advise.

## 2021-04-01 DIAGNOSIS — L97909 Non-pressure chronic ulcer of unspecified part of unspecified lower leg with unspecified severity: Secondary | ICD-10-CM | POA: Diagnosis not present

## 2021-04-01 NOTE — Telephone Encounter (Signed)
I called and left the RN [facility] a VM making her aware of the NP's instructions.

## 2021-04-07 ENCOUNTER — Telehealth: Payer: Self-pay

## 2021-04-07 DIAGNOSIS — J449 Chronic obstructive pulmonary disease, unspecified: Secondary | ICD-10-CM | POA: Diagnosis not present

## 2021-04-07 DIAGNOSIS — I10 Essential (primary) hypertension: Secondary | ICD-10-CM | POA: Diagnosis not present

## 2021-04-07 NOTE — Telephone Encounter (Signed)
Gave verbal order to [facility] ([facility])for PT evaluation [phone]

## 2021-04-08 DIAGNOSIS — J449 Chronic obstructive pulmonary disease, unspecified: Secondary | ICD-10-CM | POA: Diagnosis not present

## 2021-04-08 DIAGNOSIS — I509 Heart failure, unspecified: Secondary | ICD-10-CM | POA: Diagnosis not present

## 2021-04-08 DIAGNOSIS — L97921 Non-pressure chronic ulcer of unspecified part of left lower leg limited to breakdown of skin: Secondary | ICD-10-CM | POA: Diagnosis not present

## 2021-04-08 DIAGNOSIS — M6281 Muscle weakness (generalized): Secondary | ICD-10-CM | POA: Diagnosis not present

## 2021-04-08 DIAGNOSIS — Z87891 Personal history of nicotine dependence: Secondary | ICD-10-CM | POA: Diagnosis not present

## 2021-04-08 DIAGNOSIS — I2510 Atherosclerotic heart disease of native coronary artery without angina pectoris: Secondary | ICD-10-CM | POA: Diagnosis not present

## 2021-04-08 DIAGNOSIS — I4891 Unspecified atrial fibrillation: Secondary | ICD-10-CM | POA: Diagnosis not present

## 2021-04-08 DIAGNOSIS — I872 Venous insufficiency (chronic) (peripheral): Secondary | ICD-10-CM | POA: Diagnosis not present

## 2021-04-08 DIAGNOSIS — I110 Hypertensive heart disease with heart failure: Secondary | ICD-10-CM | POA: Diagnosis not present

## 2021-04-08 DIAGNOSIS — I252 Old myocardial infarction: Secondary | ICD-10-CM | POA: Diagnosis not present

## 2021-04-09 ENCOUNTER — Telehealth (INDEPENDENT_AMBULATORY_CARE_PROVIDER_SITE_OTHER): Payer: Self-pay

## 2021-04-09 DIAGNOSIS — J449 Chronic obstructive pulmonary disease, unspecified: Secondary | ICD-10-CM | POA: Diagnosis not present

## 2021-04-09 NOTE — Telephone Encounter (Signed)
Sonja from [facility] requested orders for the patient to start using compression wraps. I spoke with Sheppard Plumber NP and she is fine with patient starting to used the compression wraps. Celine Mans will check to see if they will have to order the wraps if not she will contact the office.

## 2021-04-13 DIAGNOSIS — L97921 Non-pressure chronic ulcer of unspecified part of left lower leg limited to breakdown of skin: Secondary | ICD-10-CM | POA: Diagnosis not present

## 2021-04-13 DIAGNOSIS — I110 Hypertensive heart disease with heart failure: Secondary | ICD-10-CM | POA: Diagnosis not present

## 2021-04-13 DIAGNOSIS — I872 Venous insufficiency (chronic) (peripheral): Secondary | ICD-10-CM | POA: Diagnosis not present

## 2021-04-13 DIAGNOSIS — I4891 Unspecified atrial fibrillation: Secondary | ICD-10-CM | POA: Diagnosis not present

## 2021-04-13 DIAGNOSIS — I2510 Atherosclerotic heart disease of native coronary artery without angina pectoris: Secondary | ICD-10-CM | POA: Diagnosis not present

## 2021-04-13 DIAGNOSIS — J449 Chronic obstructive pulmonary disease, unspecified: Secondary | ICD-10-CM | POA: Diagnosis not present

## 2021-04-13 DIAGNOSIS — M6281 Muscle weakness (generalized): Secondary | ICD-10-CM | POA: Diagnosis not present

## 2021-04-13 DIAGNOSIS — Z87891 Personal history of nicotine dependence: Secondary | ICD-10-CM | POA: Diagnosis not present

## 2021-04-13 DIAGNOSIS — I252 Old myocardial infarction: Secondary | ICD-10-CM | POA: Diagnosis not present

## 2021-04-13 DIAGNOSIS — I509 Heart failure, unspecified: Secondary | ICD-10-CM | POA: Diagnosis not present

## 2021-04-14 ENCOUNTER — Other Ambulatory Visit: Payer: Self-pay

## 2021-04-14 MED ORDER — TRELEGY ELLIPTA 100-62.5-25 MCG/INH IN AEPB: 1.0000 | INHALATION_SPRAY | Freq: Every day | RESPIRATORY_TRACT | 3 refills | Status: DC

## 2021-04-15 DIAGNOSIS — I509 Heart failure, unspecified: Secondary | ICD-10-CM | POA: Diagnosis not present

## 2021-04-15 DIAGNOSIS — I110 Hypertensive heart disease with heart failure: Secondary | ICD-10-CM | POA: Diagnosis not present

## 2021-04-15 DIAGNOSIS — I4891 Unspecified atrial fibrillation: Secondary | ICD-10-CM | POA: Diagnosis not present

## 2021-04-15 DIAGNOSIS — Z87891 Personal history of nicotine dependence: Secondary | ICD-10-CM | POA: Diagnosis not present

## 2021-04-15 DIAGNOSIS — I872 Venous insufficiency (chronic) (peripheral): Secondary | ICD-10-CM | POA: Diagnosis not present

## 2021-04-15 DIAGNOSIS — I252 Old myocardial infarction: Secondary | ICD-10-CM | POA: Diagnosis not present

## 2021-04-15 DIAGNOSIS — M6281 Muscle weakness (generalized): Secondary | ICD-10-CM | POA: Diagnosis not present

## 2021-04-15 DIAGNOSIS — J449 Chronic obstructive pulmonary disease, unspecified: Secondary | ICD-10-CM | POA: Diagnosis not present

## 2021-04-15 DIAGNOSIS — L97921 Non-pressure chronic ulcer of unspecified part of left lower leg limited to breakdown of skin: Secondary | ICD-10-CM | POA: Diagnosis not present

## 2021-04-15 DIAGNOSIS — I2510 Atherosclerotic heart disease of native coronary artery without angina pectoris: Secondary | ICD-10-CM | POA: Diagnosis not present

## 2021-04-17 DIAGNOSIS — J449 Chronic obstructive pulmonary disease, unspecified: Secondary | ICD-10-CM | POA: Diagnosis not present

## 2021-04-17 DIAGNOSIS — R0902 Hypoxemia: Secondary | ICD-10-CM | POA: Diagnosis not present

## 2021-04-18 DIAGNOSIS — J449 Chronic obstructive pulmonary disease, unspecified: Secondary | ICD-10-CM | POA: Diagnosis not present

## 2021-04-18 DIAGNOSIS — J439 Emphysema, unspecified: Secondary | ICD-10-CM | POA: Diagnosis not present

## 2021-04-18 NOTE — Progress Notes (Deleted)
Cardiology Office Note    Date:  04/18/2021   ID:  Erik [name], DOB 12-May-1956, MRN [id number]  PCP:  Lyndon Code, MD  Cardiologist:  Lorine Bears, MD  Electrophysiologist:  None   Chief Complaint: ***  History of Present Illness:   Erik [name] is a 65 y.o. male with history of CAD status post PCI/BMS to OM2 in 2009, HFrEF secondary to ICM, chronic A. fib, chronic hypoxic respiratory failure on home O2 at 2 to 3 L via nasal cannula since 2012, COPD, prior tobacco use quitting in 1997, and HLD who presents for ***  He was previously followed by Dr. Gwen Pounds with [facility] clinic cardiology.  He was admitted in 2014 with mild acute pancreatitis with anticoagulation being discontinued at that time due to adherence issues per prior notes.  There was no significant bleeding event.  Echo at that time demonstrated an EF of 35 to 40%, moderately dilated left atrium, mild tricuspid regurgitation, and mild to moderate mitral regurgitation.  Most recent LHC from 2016 demonstrated a patent OM2 stent with mild in-stent restenosis, chronically occluded proximal RCA with good collaterals, moderate mid LAD stenosis, and mild LCx disease.  Echo in 08/2017 demonstrated an EF of 55 to 60%, dilated left atrium, and no significant valvular abnormalities.  Echo performed at outside office in 03/2020 reported an EF of 40% with septal and anteroapical hypokinesis and mild tricuspid regurgitation.  He establish care with our office in 09/2020.  He reported since having had an upper respiratory tract infection in 07/2020, he did not think it was COVID, he reported worsening exertional dyspnea as well as intermittent tachycardia with heart rates going into the 170s bpm.  He did not note any significant weight gain, though did have lower extremity edema.  Ventricular rates were suboptimally controlled in the 1 teens bpm.  He was started on metoprolol for added rate control and to optimize GDMT.  Due to his  CHA2DS2-VASc of 4 he was initiated on Eliquis.  To further evaluate his dyspnea, he underwent echo in 09/2020 which showed an EF of 40 to 45%, indeterminate LV diastolic function parameters, low normal RV systolic function with normal RV cavity size, severely dilated left atrium, moderately dilated right atrium, degenerative mitral valve with mild regurgitation, mild aortic valve sclerosis without evidence of stenosis, and an estimated right atrial pressure of 3 mmHg.  This was compared to a prior echo performed in [place], [place] with an EF of 49% noted.  In follow-up in 10/2020, diltiazem was discontinued and he was initiated on losartan.  He preferred to think about diagnostic cardiac cath to further evaluate his cardiomyopathy.  He was last seen in the office in 11/2020 and continued to prefer to defer cardiac cath.  His weight was up 15 pounds since his clinic visit 2 weeks prior.  He noted improvement in his lower extremity swelling following the discontinuation of diltiazem.  He was initiated on spironolactone.  Follow-up labs remain pending.  ***   Labs independently reviewed: 10/2020 - potassium 4.4, BUN 13, serum creatinine 0.8, Hgb 15.2, PLT 350, TC 223, TG 83, HDL 57, LDL 149, direct LDL 140 02/2020 - albumin 4.2, AST/ALT normal, TSH normal    Past Medical History:  Diagnosis Date   Arthritis    Asthma    Atrial fibrillation (HCC)    CHF (congestive heart failure) (HCC)    COPD (chronic obstructive pulmonary disease) (HCC)    Coronary artery disease  GERD (gastroesophageal reflux disease)    Hypertension    Myocardial infarction [facility])    Shortness of breath dyspnea     Past Surgical History:  Procedure Laterality Date   ANGIOPLASTY  2009   CARDIAC CATHETERIZATION N/A 03/18/2015   Procedure: Left Heart Cath with Coronary Angiography;  Surgeon: Lamar Blinks, MD;  Location: [facility] INVASIVE CV LAB;  Service: Cardiovascular;  Laterality: N/A;   CHOLECYSTECTOMY  2005   CORONARY  ANGIOGRAM  2009    Current Medications: No outpatient medications have been marked as taking for the 04/23/21 encounter (Appointment) with Sondra Barges, PA-C.    Allergies:   Benadryl [diphenhydramine hcl] and Morphine and related   Social History   Socioeconomic History   Marital status: Married    Spouse name: Not on file   Number of children: Not on file   Years of education: Not on file   Highest education level: Not on file  Occupational History   Not on file  Tobacco Use   Smoking status: Former    Years: 25.00    Types: Cigarettes    Quit date: 03/20/1997    Years since quitting: 24.0   Smokeless tobacco: Never  Vaping Use   Vaping Use: Never used  Substance and Sexual Activity   Alcohol use: No   Drug use: No   Sexual activity: Not on file  Other Topics Concern   Not on file  Social History Narrative   Not on file   Social Determinants of Health   Financial Resource Strain: Low Risk    Difficulty of Paying Living Expenses: Not hard at all  Food Insecurity: Not on file  Transportation Needs: Not on file  Physical Activity: Not on file  Stress: Not on file  Social Connections: Not on file     Family History:  The patient's family history includes Coronary artery disease in his father; Diabetes in his father; Hyperlipidemia in his father and mother; Hypertension in his father; Stroke in his mother.  ROS:   ROS   EKGs/Labs/Other Studies Reviewed:    Studies reviewed were summarized above. The additional studies were reviewed today:  2D echo 09/30/2020: 1. Left ventricular ejection fraction, by estimation, is 40 to 45%. The  left ventricle has mild to moderately decreased function. Left ventricular  endocardial border not optimally defined to evaluate regional wall motion.  Left ventricular diastolic  parameters are indeterminate.   2. Right ventricular systolic function is low normal. The right  ventricular size is normal.   3. Left atrial size was  severely dilated.   4. Right atrial size was moderately dilated.   5. The mitral valve is degenerative. Mild mitral valve regurgitation.   6. The aortic valve was not well visualized. Aortic valve regurgitation  is not visualized. Mild aortic valve sclerosis is present, with no  evidence of aortic valve stenosis.   7. The inferior vena cava is normal in size with greater than 50%  respiratory variability, suggesting right atrial pressure of 3 mmHg.   Comparison(s): Prior Echo done in [place], [place]  EF 49%.     EKG:  EKG is ordered today.  The EKG ordered today demonstrates ***  Recent Labs: 11/02/2020: BUN 13; Creatinine, Ser 0.80; Hemoglobin 15.2; Platelets 350; Potassium 4.4; Sodium 137  Recent Lipid Panel    Component Value Date/Time   CHOL 223 (H) 11/02/2020 1649   CHOL 184 02/19/2020 0000   CHOL 146 07/18/2013 0934   TRIG 83 11/02/2020  1649   TRIG 53 07/18/2013 0934   HDL 57 11/02/2020 1649   HDL 49 02/19/2020 0000   HDL 48 07/18/2013 0934   CHOLHDL 3.9 11/02/2020 1649   VLDL 17 11/02/2020 1649   VLDL 11 07/18/2013 0934   LDLCALC 149 (H) 11/02/2020 1649   LDLCALC 115 (H) 02/19/2020 0000   LDLCALC 87 07/18/2013 0934   LDLDIRECT 140.3 (H) 11/02/2020 1649    PHYSICAL EXAM:    VS:  There were no vitals taken for this visit.  BMI: There is no height or weight on file to calculate BMI.  Physical Exam  Wt Readings from Last 3 Encounters:  11/16/20 212 lb (96.2 kg)  11/03/20 227 lb (103 kg)  11/02/20 227 lb (103 kg)     ASSESSMENT & PLAN:   CAD involving the native coronary arteries without***angina:  HFrEF secondary to ICM:  Permanent A. fib:  HLD: LDL 140 in 10/2020 with goal LDL being less than 70.  Venous ulcer: Followed by vascular surgery.  Chronic hypoxic respiratory failure/COPD:  Disposition: F/u with Dr. Kirke Corin or an APP in ***.   Medication Adjustments/Labs and Tests Ordered: Current medicines are reviewed at length with the patient today.   Concerns regarding medicines are outlined above. Medication changes, Labs and Tests ordered today are summarized above and listed in the Patient Instructions accessible in Encounters.   Signed, Eula Listen, PA-C 04/18/2021 10:42 AM     [facility] [place] [address] [place], [place] [address] [phone]

## 2021-04-22 ENCOUNTER — Telehealth: Payer: Self-pay | Admitting: Pharmacist

## 2021-04-22 ENCOUNTER — Ambulatory Visit (INDEPENDENT_AMBULATORY_CARE_PROVIDER_SITE_OTHER): Payer: Medicare Other | Admitting: Vascular Surgery

## 2021-04-22 DIAGNOSIS — M6281 Muscle weakness (generalized): Secondary | ICD-10-CM | POA: Diagnosis not present

## 2021-04-22 DIAGNOSIS — I872 Venous insufficiency (chronic) (peripheral): Secondary | ICD-10-CM | POA: Diagnosis not present

## 2021-04-22 DIAGNOSIS — J449 Chronic obstructive pulmonary disease, unspecified: Secondary | ICD-10-CM | POA: Diagnosis not present

## 2021-04-22 DIAGNOSIS — I509 Heart failure, unspecified: Secondary | ICD-10-CM | POA: Diagnosis not present

## 2021-04-22 DIAGNOSIS — I252 Old myocardial infarction: Secondary | ICD-10-CM | POA: Diagnosis not present

## 2021-04-22 DIAGNOSIS — Z87891 Personal history of nicotine dependence: Secondary | ICD-10-CM | POA: Diagnosis not present

## 2021-04-22 DIAGNOSIS — I4891 Unspecified atrial fibrillation: Secondary | ICD-10-CM | POA: Diagnosis not present

## 2021-04-22 DIAGNOSIS — I110 Hypertensive heart disease with heart failure: Secondary | ICD-10-CM | POA: Diagnosis not present

## 2021-04-22 DIAGNOSIS — L97921 Non-pressure chronic ulcer of unspecified part of left lower leg limited to breakdown of skin: Secondary | ICD-10-CM | POA: Diagnosis not present

## 2021-04-22 DIAGNOSIS — I2510 Atherosclerotic heart disease of native coronary artery without angina pectoris: Secondary | ICD-10-CM | POA: Diagnosis not present

## 2021-04-22 NOTE — Progress Notes (Addendum)
    Chronic Care Management Pharmacy Assistant   Name: Erik [name]  MRN: [id number] DOB: [birth year]  Reason for Encounter: Medication Review/Medication Coordination Call   Medications: Outpatient Encounter Medications as of 04/22/2021  Medication Sig   collagenase (SANTYL) ointment Apply 1 application with unna wrap changes   Humidifier MISC Use with O2 at all times   ipratropium-albuterol (DUONEB) 0.5-2.5 (3) MG/3ML SOLN USE 1 VIAL VIA NEBULIZER EVERY 6 HOURS   loratadine (CLARITIN) 10 MG tablet Take 1 tablet (10 mg total) by mouth daily.   losartan (COZAAR) 25 MG tablet Take 1 tablet (25 mg) by mouth once daily (Patient not taking: Reported on 02/15/2021)   metoprolol tartrate (LOPRESSOR) 25 MG tablet Take 1 tablet (25 mg) by mouth twice daily   OXYGEN Inhale 2-3 L into the lungs daily.   rosuvastatin (CRESTOR) 10 MG tablet SMARTSIG:1 Tablet(s) By Mouth Every Evening (Patient not taking: Reported on 02/15/2021)   torsemide (DEMADEX) 10 MG tablet Take 1 tablet (10 mg total) by mouth daily.   TRELEGY ELLIPTA 100-62.5-25 MCG/INH AEPB Inhale 1 puff into the lungs daily.   No facility-administered encounter medications on file as of 04/22/2021.   Reviewed chart for medication changes ahead of medication coordination call.  No OVs, Consults, or hospital visits since last care coordination call.  No medication changes indicated.  BP Readings from Last 3 Encounters:  12/08/20 130/79  11/16/20 120/80  11/03/20 (!) 138/92    Lab Results  Component Value Date   HGBA1C 6.3 07/19/2013     Patient obtains medications through Vials  30 Days   Last adherence delivery included:  Trelegy Aer 100 mg Torsemide 10 mg 1 tablet daily  Metoprolol Tar 25 mg 2 tablets for breakfast and evening  Patient declined meds last month: Aspirin 81 mg 1 tablet daily (OTC) Ipratropium 0.5 mg (enough on hand) Eliquis 5 mg (Patient does not want to take this, stated he takes aspirin) Spironolactone 25  mg (Patient does not want to take this) Losartan 25 mg 1/2 tablet daily (Patient does not want to take this) Loratadine 10 mg  1 tablet daily (enough on hand) Rosuvastatin 10 mg 1 tablet daily ( enough on hand from previous pharmacy)  Patient is due for next adherence delivery on: 05/03/21.  Called patient and reviewed medications and coordinated delivery.  This delivery to include: Trelegy Aer 100 mg Torsemide 10 mg 1 tablet daily  Metoprolol Tar 25 mg 2 tablets for breakfast and evening Loratadine 10 mg  1 tablet daily  Patient declined the following medications: Rosuvastatin 10 mg 1 tablet daily (Patient does not want to take this) Aspirin 81 mg 1 tablet daily (OTC) Ipratropium 0.5 mg (enough on hand) Eliquis 5 mg (Patient does not want to take this, stated he takes aspirin) Spironolactone 25 mg (Patient does not want to take this) Losartan 25 mg 1/2 tablet daily (Patient does not want to take this)  Patient does not need refills at this time.   Confirmed delivery date of 05/03/21, advised patient that pharmacy will contact them the morning of delivery.   Care Gaps:Patient is due for a colonoscopy.   Follow-Up:Pharmacist Review  Hulen Luster, RMA Clinical Pharmacist Assistant [phone]  10 minutes spent in review, coordination, and documentation.  Reviewed by: Willa Frater, PharmD Clinical Pharmacist [phone]

## 2021-04-23 ENCOUNTER — Ambulatory Visit: Payer: Medicare Other | Admitting: Physician Assistant

## 2021-04-28 DIAGNOSIS — J449 Chronic obstructive pulmonary disease, unspecified: Secondary | ICD-10-CM | POA: Diagnosis not present

## 2021-04-28 DIAGNOSIS — I2510 Atherosclerotic heart disease of native coronary artery without angina pectoris: Secondary | ICD-10-CM | POA: Diagnosis not present

## 2021-04-28 DIAGNOSIS — I252 Old myocardial infarction: Secondary | ICD-10-CM | POA: Diagnosis not present

## 2021-04-28 DIAGNOSIS — I872 Venous insufficiency (chronic) (peripheral): Secondary | ICD-10-CM | POA: Diagnosis not present

## 2021-04-28 DIAGNOSIS — I509 Heart failure, unspecified: Secondary | ICD-10-CM | POA: Diagnosis not present

## 2021-04-28 DIAGNOSIS — Z87891 Personal history of nicotine dependence: Secondary | ICD-10-CM | POA: Diagnosis not present

## 2021-04-28 DIAGNOSIS — M6281 Muscle weakness (generalized): Secondary | ICD-10-CM | POA: Diagnosis not present

## 2021-04-28 DIAGNOSIS — I110 Hypertensive heart disease with heart failure: Secondary | ICD-10-CM | POA: Diagnosis not present

## 2021-04-28 DIAGNOSIS — I4891 Unspecified atrial fibrillation: Secondary | ICD-10-CM | POA: Diagnosis not present

## 2021-04-28 DIAGNOSIS — L97921 Non-pressure chronic ulcer of unspecified part of left lower leg limited to breakdown of skin: Secondary | ICD-10-CM | POA: Diagnosis not present

## 2021-05-05 DIAGNOSIS — Z87891 Personal history of nicotine dependence: Secondary | ICD-10-CM | POA: Diagnosis not present

## 2021-05-05 DIAGNOSIS — I2510 Atherosclerotic heart disease of native coronary artery without angina pectoris: Secondary | ICD-10-CM | POA: Diagnosis not present

## 2021-05-05 DIAGNOSIS — I872 Venous insufficiency (chronic) (peripheral): Secondary | ICD-10-CM | POA: Diagnosis not present

## 2021-05-05 DIAGNOSIS — J449 Chronic obstructive pulmonary disease, unspecified: Secondary | ICD-10-CM | POA: Diagnosis not present

## 2021-05-05 DIAGNOSIS — I252 Old myocardial infarction: Secondary | ICD-10-CM | POA: Diagnosis not present

## 2021-05-05 DIAGNOSIS — L97921 Non-pressure chronic ulcer of unspecified part of left lower leg limited to breakdown of skin: Secondary | ICD-10-CM | POA: Diagnosis not present

## 2021-05-05 DIAGNOSIS — I4891 Unspecified atrial fibrillation: Secondary | ICD-10-CM | POA: Diagnosis not present

## 2021-05-05 DIAGNOSIS — I110 Hypertensive heart disease with heart failure: Secondary | ICD-10-CM | POA: Diagnosis not present

## 2021-05-05 DIAGNOSIS — I509 Heart failure, unspecified: Secondary | ICD-10-CM | POA: Diagnosis not present

## 2021-05-05 DIAGNOSIS — M6281 Muscle weakness (generalized): Secondary | ICD-10-CM | POA: Diagnosis not present

## 2021-05-07 DIAGNOSIS — I10 Essential (primary) hypertension: Secondary | ICD-10-CM | POA: Diagnosis not present

## 2021-05-07 DIAGNOSIS — J449 Chronic obstructive pulmonary disease, unspecified: Secondary | ICD-10-CM | POA: Diagnosis not present

## 2021-05-10 ENCOUNTER — Ambulatory Visit (INDEPENDENT_AMBULATORY_CARE_PROVIDER_SITE_OTHER): Payer: Medicare Other | Admitting: Vascular Surgery

## 2021-05-11 ENCOUNTER — Encounter (INDEPENDENT_AMBULATORY_CARE_PROVIDER_SITE_OTHER): Payer: Self-pay | Admitting: Vascular Surgery

## 2021-05-12 DIAGNOSIS — I4891 Unspecified atrial fibrillation: Secondary | ICD-10-CM | POA: Diagnosis not present

## 2021-05-12 DIAGNOSIS — I2510 Atherosclerotic heart disease of native coronary artery without angina pectoris: Secondary | ICD-10-CM | POA: Diagnosis not present

## 2021-05-12 DIAGNOSIS — J449 Chronic obstructive pulmonary disease, unspecified: Secondary | ICD-10-CM | POA: Diagnosis not present

## 2021-05-12 DIAGNOSIS — M6281 Muscle weakness (generalized): Secondary | ICD-10-CM | POA: Diagnosis not present

## 2021-05-12 DIAGNOSIS — I509 Heart failure, unspecified: Secondary | ICD-10-CM | POA: Diagnosis not present

## 2021-05-12 DIAGNOSIS — I252 Old myocardial infarction: Secondary | ICD-10-CM | POA: Diagnosis not present

## 2021-05-12 DIAGNOSIS — L97921 Non-pressure chronic ulcer of unspecified part of left lower leg limited to breakdown of skin: Secondary | ICD-10-CM | POA: Diagnosis not present

## 2021-05-12 DIAGNOSIS — I872 Venous insufficiency (chronic) (peripheral): Secondary | ICD-10-CM | POA: Diagnosis not present

## 2021-05-12 DIAGNOSIS — I110 Hypertensive heart disease with heart failure: Secondary | ICD-10-CM | POA: Diagnosis not present

## 2021-05-17 DIAGNOSIS — J449 Chronic obstructive pulmonary disease, unspecified: Secondary | ICD-10-CM | POA: Diagnosis not present

## 2021-05-17 DIAGNOSIS — R0902 Hypoxemia: Secondary | ICD-10-CM | POA: Diagnosis not present

## 2021-05-17 NOTE — Progress Notes (Deleted)
Cardiology Office Note    Date:  05/17/2021   ID:  Erik [name], DOB 07/05/1956, MRN [id number]  PCP:  Lyndon Code, MD  Cardiologist:  Lorine Bears, MD  Electrophysiologist:  None   Chief Complaint: ***  History of Present Illness:   Erik [name] is a 65 y.o. male with history of CAD status post PCI/BMS to OM2 in 2009, HFrEF secondary to ICM, chronic A. fib, chronic hypoxic respiratory failure on home O2 at 2 to 3 L via nasal cannula since 2012, COPD, prior tobacco use quitting in 1997, and HLD who presents for ***  He was previously followed by Dr. Gwen Pounds with [facility] clinic cardiology.  He was admitted in 2014 with mild acute pancreatitis with anticoagulation being discontinued at that time due to adherence issues per prior notes.  There was no significant bleeding event.  Echo at that time demonstrated an EF of 35 to 40%, moderately dilated left atrium, mild tricuspid regurgitation, and mild to moderate mitral regurgitation.  Most recent LHC from 2016 demonstrated a patent OM2 stent with mild in-stent restenosis, chronically occluded proximal RCA with good collaterals, moderate mid LAD stenosis, and mild LCx disease.  Echo in 08/2017 demonstrated an EF of 55 to 60%, dilated left atrium, and no significant valvular abnormalities.  Echo performed at outside office in 03/2020 reported an EF of 40% with septal and anteroapical hypokinesis and mild tricuspid regurgitation.  He establish care with our office in 09/2020.  He reported since having had an upper respiratory tract infection in 07/2020, he did not think it was COVID, he reported worsening exertional dyspnea as well as intermittent tachycardia with heart rates going into the 170s bpm.  He did not note any significant weight gain, though did have lower extremity edema.  Ventricular rates were suboptimally controlled in the 1 teens bpm.  He was started on metoprolol for added rate control and to optimize GDMT.  Due to his  CHA2DS2-VASc of 4 he was initiated on Eliquis.  To further evaluate his dyspnea, he underwent echo in 09/2020 which showed an EF of 40 to 45%, indeterminate LV diastolic function parameters, low normal RV systolic function with normal RV cavity size, severely dilated left atrium, moderately dilated right atrium, degenerative mitral valve with mild regurgitation, mild aortic valve sclerosis without evidence of stenosis, and an estimated right atrial pressure of 3 mmHg.  This was compared to a prior echo performed in [place], [place] with an EF of 49% noted.  In follow-up in 10/2020, diltiazem was discontinued and he was initiated on losartan.  He preferred to think about diagnostic cardiac cath to further evaluate his cardiomyopathy.  He was last seen in the office in 11/2020 and continued to prefer to defer cardiac cath.  His weight was up 15 pounds since his clinic visit 2 weeks prior.  He noted improvement in his lower extremity swelling following the discontinuation of diltiazem.  He was initiated on spironolactone.  Follow-up labs remain pending.  ***   Labs independently reviewed: 10/2020 - potassium 4.4, BUN 13, serum creatinine 0.8, Hgb 15.2, PLT 350, TC 223, TG 83, HDL 57, LDL 149, direct LDL 140 02/2020 - albumin 4.2, AST/ALT normal, TSH normal   Past Medical History:  Diagnosis Date   Arthritis    Asthma    Atrial fibrillation (HCC)    CHF (congestive heart failure) (HCC)    COPD (chronic obstructive pulmonary disease) (HCC)    Coronary artery disease    GERD (  gastroesophageal reflux disease)    Hypertension    Myocardial infarction [facility])    Shortness of breath dyspnea     Past Surgical History:  Procedure Laterality Date   ANGIOPLASTY  2009   CARDIAC CATHETERIZATION N/A 03/18/2015   Procedure: Left Heart Cath with Coronary Angiography;  Surgeon: Lamar Blinks, MD;  Location: [facility] INVASIVE CV LAB;  Service: Cardiovascular;  Laterality: N/A;   CHOLECYSTECTOMY  2005   CORONARY  ANGIOGRAM  2009    Current Medications: No outpatient medications have been marked as taking for the 05/19/21 encounter (Appointment) with Sondra Barges, PA-C.    Allergies:   Benadryl [diphenhydramine hcl] and Morphine and related   Social History   Socioeconomic History   Marital status: Married    Spouse name: Not on file   Number of children: Not on file   Years of education: Not on file   Highest education level: Not on file  Occupational History   Not on file  Tobacco Use   Smoking status: Former    Years: 25.00    Types: Cigarettes    Quit date: 03/20/1997    Years since quitting: 24.1   Smokeless tobacco: Never  Vaping Use   Vaping Use: Never used  Substance and Sexual Activity   Alcohol use: No   Drug use: No   Sexual activity: Not on file  Other Topics Concern   Not on file  Social History Narrative   Not on file   Social Determinants of Health   Financial Resource Strain: Low Risk    Difficulty of Paying Living Expenses: Not hard at all  Food Insecurity: Not on file  Transportation Needs: Not on file  Physical Activity: Not on file  Stress: Not on file  Social Connections: Not on file     Family History:  The patient's family history includes Coronary artery disease in his father; Diabetes in his father; Hyperlipidemia in his father and mother; Hypertension in his father; Stroke in his mother.  ROS:   ROS   EKGs/Labs/Other Studies Reviewed:    Studies reviewed were summarized above. The additional studies were reviewed today:  2D echo 09/30/2020: 1. Left ventricular ejection fraction, by estimation, is 40 to 45%. The  left ventricle has mild to moderately decreased function. Left ventricular  endocardial border not optimally defined to evaluate regional wall motion.  Left ventricular diastolic  parameters are indeterminate.   2. Right ventricular systolic function is low normal. The right  ventricular size is normal.   3. Left atrial size was  severely dilated.   4. Right atrial size was moderately dilated.   5. The mitral valve is degenerative. Mild mitral valve regurgitation.   6. The aortic valve was not well visualized. Aortic valve regurgitation  is not visualized. Mild aortic valve sclerosis is present, with no  evidence of aortic valve stenosis.   7. The inferior vena cava is normal in size with greater than 50%  respiratory variability, suggesting right atrial pressure of 3 mmHg.   Comparison(s): Prior Echo done in [place], [place]  EF 49%.     EKG:  EKG is ordered today.  The EKG ordered today demonstrates ***  Recent Labs: 11/02/2020: BUN 13; Creatinine, Ser 0.80; Hemoglobin 15.2; Platelets 350; Potassium 4.4; Sodium 137  Recent Lipid Panel    Component Value Date/Time   CHOL 223 (H) 11/02/2020 1649   CHOL 184 02/19/2020 0000   CHOL 146 07/18/2013 0934   TRIG 83 11/02/2020 1649  TRIG 53 07/18/2013 0934   HDL 57 11/02/2020 1649   HDL 49 02/19/2020 0000   HDL 48 07/18/2013 0934   CHOLHDL 3.9 11/02/2020 1649   VLDL 17 11/02/2020 1649   VLDL 11 07/18/2013 0934   LDLCALC 149 (H) 11/02/2020 1649   LDLCALC 115 (H) 02/19/2020 0000   LDLCALC 87 07/18/2013 0934   LDLDIRECT 140.3 (H) 11/02/2020 1649    PHYSICAL EXAM:    VS:  There were no vitals taken for this visit.  BMI: There is no height or weight on file to calculate BMI.  Physical Exam  Wt Readings from Last 3 Encounters:  11/16/20 212 lb (96.2 kg)  11/03/20 227 lb (103 kg)  11/02/20 227 lb (103 kg)     ASSESSMENT & PLAN:   CAD involving the native coronary arteries without***angina:  HFrEF secondary to ICM:  Permanent A. fib:  HLD: LDL 140 in 10/2020 with goal LDL being less than 70.  Venous ulcer: Followed by vascular surgery.  Chronic hypoxic respiratory failure/COPD:  Disposition: F/u with Dr. Kirke Corin or an APP in ***.   Medication Adjustments/Labs and Tests Ordered: Current medicines are reviewed at length with the patient today.   Concerns regarding medicines are outlined above. Medication changes, Labs and Tests ordered today are summarized above and listed in the Patient Instructions accessible in Encounters.   Signed, Eula Listen, PA-C 05/17/2021 7:53 AM     [facility] [place] [address] [place], [place] [address] [phone]

## 2021-05-18 DIAGNOSIS — J449 Chronic obstructive pulmonary disease, unspecified: Secondary | ICD-10-CM | POA: Diagnosis not present

## 2021-05-18 DIAGNOSIS — J439 Emphysema, unspecified: Secondary | ICD-10-CM | POA: Diagnosis not present

## 2021-05-19 ENCOUNTER — Ambulatory Visit: Payer: Medicare Other | Admitting: Physician Assistant

## 2021-05-19 ENCOUNTER — Telehealth: Payer: Self-pay | Admitting: Pharmacist

## 2021-05-19 DIAGNOSIS — I2510 Atherosclerotic heart disease of native coronary artery without angina pectoris: Secondary | ICD-10-CM | POA: Diagnosis not present

## 2021-05-19 DIAGNOSIS — M6281 Muscle weakness (generalized): Secondary | ICD-10-CM | POA: Diagnosis not present

## 2021-05-19 DIAGNOSIS — I110 Hypertensive heart disease with heart failure: Secondary | ICD-10-CM | POA: Diagnosis not present

## 2021-05-19 DIAGNOSIS — I509 Heart failure, unspecified: Secondary | ICD-10-CM | POA: Diagnosis not present

## 2021-05-19 DIAGNOSIS — L97921 Non-pressure chronic ulcer of unspecified part of left lower leg limited to breakdown of skin: Secondary | ICD-10-CM | POA: Diagnosis not present

## 2021-05-19 DIAGNOSIS — I252 Old myocardial infarction: Secondary | ICD-10-CM | POA: Diagnosis not present

## 2021-05-19 DIAGNOSIS — J449 Chronic obstructive pulmonary disease, unspecified: Secondary | ICD-10-CM | POA: Diagnosis not present

## 2021-05-19 DIAGNOSIS — I872 Venous insufficiency (chronic) (peripheral): Secondary | ICD-10-CM | POA: Diagnosis not present

## 2021-05-19 DIAGNOSIS — I4891 Unspecified atrial fibrillation: Secondary | ICD-10-CM | POA: Diagnosis not present

## 2021-05-19 NOTE — Progress Notes (Addendum)
    Chronic Care Management Pharmacy Assistant   Name: Erik [name]  MRN: [id number] DOB: Jul 30, 1956  Reason for Encounter: Medication Review/Medication Coordination Call   Medications: Outpatient Encounter Medications as of 05/19/2021  Medication Sig   collagenase (SANTYL) ointment Apply 1 application with unna wrap changes   Humidifier MISC Use with O2 at all times   ipratropium-albuterol (DUONEB) 0.5-2.5 (3) MG/3ML SOLN USE 1 VIAL VIA NEBULIZER EVERY 6 HOURS   loratadine (CLARITIN) 10 MG tablet Take 1 tablet (10 mg total) by mouth daily.   losartan (COZAAR) 25 MG tablet Take 1 tablet (25 mg) by mouth once daily (Patient not taking: Reported on 02/15/2021)   metoprolol tartrate (LOPRESSOR) 25 MG tablet Take 1 tablet (25 mg) by mouth twice daily   OXYGEN Inhale 2-3 L into the lungs daily.   rosuvastatin (CRESTOR) 10 MG tablet SMARTSIG:1 Tablet(s) By Mouth Every Evening (Patient not taking: Reported on 02/15/2021)   torsemide (DEMADEX) 10 MG tablet Take 1 tablet (10 mg total) by mouth daily.   TRELEGY ELLIPTA 100-62.5-25 MCG/INH AEPB Inhale 1 puff into the lungs daily.   No facility-administered encounter medications on file as of 05/19/2021.    Reviewed chart for medication changes ahead of medication coordination call.  No OVs, Consults, or hospital visits since last care coordination call.  No medication changes indicated.  BP Readings from Last 3 Encounters:  12/08/20 130/79  11/16/20 120/80  11/03/20 (!) 138/92    Lab Results  Component Value Date   HGBA1C 6.3 07/19/2013     Patient obtains medications through Vials  30 Days   Last adherence delivery included:  Trelegy Aer 100 mg Torsemide 10 mg 1 tablet daily  Metoprolol Tar 25 mg 2 tablets for breakfast and evening Loratadine 10 mg  1 tablet daily  Patient declined meds last month: Rosuvastatin 10 mg 1 tablet daily (Patient does not want to take this) Aspirin 81 mg 1 tablet daily (OTC) Ipratropium 0.5 mg  (enough on hand) Eliquis 5 mg (Patient does not want to take this, stated he takes aspirin) Spironolactone 25 mg (Patient does not want to take this) Losartan 25 mg 1/2 tablet daily (Patient does not want to take this)  Patient is due for next adherence delivery on: 06/01/21.  Called patient and reviewed medications and coordinated delivery.  This delivery to include: Trelegy Aer 100 mg Torsemide 10 mg 1 tablet daily  Metoprolol Tar 25 mg 2 tablets for breakfast and evening  Patient declined the following medications: Loratadine 10 mg  1 tablet daily Rosuvastatin 10 mg 1 tablet daily (Patient does not want to take this) Aspirin 81 mg 1 tablet daily (OTC) Ipratropium 0.5 mg (enough on hand) Eliquis 5 mg (Patient does not want to take this, stated he takes aspirin) Spironolactone 25 mg (Patient does not want to take this) Losartan 25 mg 1/2 tablet daily (Patient does not want to take this)  Patient does not need refills at this time.   Confirmed delivery date of 06/01/21, advised patient that pharmacy will contact them the morning of delivery.  Follow-Up:Pharmacist Review  Hulen Luster, RMA Clinical Pharmacist Assistant [phone]  7 minutes spent in review, coordination, and documentation.  Reviewed by: Willa Frater, PharmD Clinical Pharmacist [phone]

## 2021-05-20 ENCOUNTER — Telehealth: Payer: Self-pay

## 2021-05-20 NOTE — Telephone Encounter (Signed)
Pt called unable to come in early appt due to he cannot get up advised him call back and make appt face to face even for home health help or any  DME

## 2021-05-24 ENCOUNTER — Ambulatory Visit: Payer: Medicare Other | Admitting: Physician Assistant

## 2021-05-27 DIAGNOSIS — I872 Venous insufficiency (chronic) (peripheral): Secondary | ICD-10-CM | POA: Diagnosis not present

## 2021-05-27 DIAGNOSIS — M6281 Muscle weakness (generalized): Secondary | ICD-10-CM | POA: Diagnosis not present

## 2021-05-27 DIAGNOSIS — I4891 Unspecified atrial fibrillation: Secondary | ICD-10-CM | POA: Diagnosis not present

## 2021-05-27 DIAGNOSIS — I2510 Atherosclerotic heart disease of native coronary artery without angina pectoris: Secondary | ICD-10-CM | POA: Diagnosis not present

## 2021-05-27 DIAGNOSIS — J449 Chronic obstructive pulmonary disease, unspecified: Secondary | ICD-10-CM | POA: Diagnosis not present

## 2021-05-27 DIAGNOSIS — I110 Hypertensive heart disease with heart failure: Secondary | ICD-10-CM | POA: Diagnosis not present

## 2021-05-27 DIAGNOSIS — I509 Heart failure, unspecified: Secondary | ICD-10-CM | POA: Diagnosis not present

## 2021-05-27 DIAGNOSIS — L97921 Non-pressure chronic ulcer of unspecified part of left lower leg limited to breakdown of skin: Secondary | ICD-10-CM | POA: Diagnosis not present

## 2021-05-27 DIAGNOSIS — I252 Old myocardial infarction: Secondary | ICD-10-CM | POA: Diagnosis not present

## 2021-05-28 ENCOUNTER — Telehealth: Payer: Self-pay | Admitting: Cardiovascular Disease

## 2021-05-28 NOTE — Telephone Encounter (Signed)
  Patient Consent for Virtual Visit        Erik [name] has provided verbal consent on 05/28/2021 for a virtual visit (video or telephone).   CONSENT FOR VIRTUAL VISIT FOR:  Erik [name]  By participating in this virtual visit I agree to the following:  I hereby voluntarily request, consent and authorize [facility] and its employed or contracted physicians, physician assistants, nurse practitioners or other licensed health care professionals (the Practitioner), to provide me with telemedicine health care services (the "Services") as deemed necessary by the treating Practitioner. I acknowledge and consent to receive the Services by the Practitioner via telemedicine. I understand that the telemedicine visit will involve communicating with the Practitioner through live audiovisual communication technology and the disclosure of certain medical information by electronic transmission. I acknowledge that I have been given the opportunity to request an in-person assessment or other available alternative prior to the telemedicine visit and am voluntarily participating in the telemedicine visit.  I understand that I have the right to withhold or withdraw my consent to the use of telemedicine in the course of my care at any time, without affecting my right to future care or treatment, and that the Practitioner or I may terminate the telemedicine visit at any time. I understand that I have the right to inspect all information obtained and/or recorded in the course of the telemedicine visit and may receive copies of available information for a reasonable fee.  I understand that some of the potential risks of receiving the Services via telemedicine include:  Delay or interruption in medical evaluation due to technological equipment failure or disruption; Information transmitted may not be sufficient (e.g. poor resolution of images) to allow for appropriate medical decision making by the Practitioner;  and/or  In rare instances, security protocols could fail, causing a breach of personal health information.  Furthermore, I acknowledge that it is my responsibility to provide information about my medical history, conditions and care that is complete and accurate to the [place] of my ability. I acknowledge that Practitioner's advice, recommendations, and/or decision may be based on factors not within their control, such as incomplete or inaccurate data provided by me or distortions of diagnostic images or specimens that may result from electronic transmissions. I understand that the practice of medicine is not an exact science and that Practitioner makes no warranties or guarantees regarding treatment outcomes. I acknowledge that a copy of this consent can be made available to me via my patient portal [facility] MyChart), or I can request a printed copy by calling the office of [facility].    I understand that my insurance will be billed for this visit.   I have read or had this consent read to me. I understand the contents of this consent, which adequately explains the benefits and risks of the Services being provided via telemedicine.  I have been provided ample opportunity to ask questions regarding this consent and the Services and have had my questions answered to my satisfaction. I give my informed consent for the services to be provided through the use of telemedicine in my medical care

## 2021-05-31 ENCOUNTER — Other Ambulatory Visit: Payer: Self-pay

## 2021-05-31 ENCOUNTER — Ambulatory Visit (INDEPENDENT_AMBULATORY_CARE_PROVIDER_SITE_OTHER): Payer: Medicare Other | Admitting: Physician Assistant

## 2021-05-31 NOTE — Progress Notes (Signed)
err

## 2021-06-02 DIAGNOSIS — I2510 Atherosclerotic heart disease of native coronary artery without angina pectoris: Secondary | ICD-10-CM | POA: Diagnosis not present

## 2021-06-02 DIAGNOSIS — J449 Chronic obstructive pulmonary disease, unspecified: Secondary | ICD-10-CM | POA: Diagnosis not present

## 2021-06-02 DIAGNOSIS — L97921 Non-pressure chronic ulcer of unspecified part of left lower leg limited to breakdown of skin: Secondary | ICD-10-CM | POA: Diagnosis not present

## 2021-06-02 DIAGNOSIS — I509 Heart failure, unspecified: Secondary | ICD-10-CM | POA: Diagnosis not present

## 2021-06-02 DIAGNOSIS — I110 Hypertensive heart disease with heart failure: Secondary | ICD-10-CM | POA: Diagnosis not present

## 2021-06-02 DIAGNOSIS — I4891 Unspecified atrial fibrillation: Secondary | ICD-10-CM | POA: Diagnosis not present

## 2021-06-02 DIAGNOSIS — I252 Old myocardial infarction: Secondary | ICD-10-CM | POA: Diagnosis not present

## 2021-06-02 DIAGNOSIS — I872 Venous insufficiency (chronic) (peripheral): Secondary | ICD-10-CM | POA: Diagnosis not present

## 2021-06-02 DIAGNOSIS — M6281 Muscle weakness (generalized): Secondary | ICD-10-CM | POA: Diagnosis not present

## 2021-06-07 DIAGNOSIS — J449 Chronic obstructive pulmonary disease, unspecified: Secondary | ICD-10-CM | POA: Diagnosis not present

## 2021-06-07 DIAGNOSIS — I10 Essential (primary) hypertension: Secondary | ICD-10-CM | POA: Diagnosis not present

## 2021-06-17 DIAGNOSIS — R0902 Hypoxemia: Secondary | ICD-10-CM | POA: Diagnosis not present

## 2021-06-17 DIAGNOSIS — J449 Chronic obstructive pulmonary disease, unspecified: Secondary | ICD-10-CM | POA: Diagnosis not present

## 2021-06-18 ENCOUNTER — Telehealth: Payer: Self-pay | Admitting: Student-PharmD

## 2021-06-18 DIAGNOSIS — J439 Emphysema, unspecified: Secondary | ICD-10-CM | POA: Diagnosis not present

## 2021-06-18 DIAGNOSIS — J449 Chronic obstructive pulmonary disease, unspecified: Secondary | ICD-10-CM | POA: Diagnosis not present

## 2021-06-18 NOTE — Progress Notes (Addendum)
    Chronic Care Management Pharmacy Assistant   Name: Erik [name]  MRN: [id number] DOB: [birth year]  Reason for Encounter: Medication Review/Medication Coordination Call   Medications: Outpatient Encounter Medications as of 06/18/2021  Medication Sig   collagenase (SANTYL) ointment Apply 1 application with unna wrap changes   Humidifier MISC Use with O2 at all times   ipratropium-albuterol (DUONEB) 0.5-2.5 (3) MG/3ML SOLN USE 1 VIAL VIA NEBULIZER EVERY 6 HOURS   loratadine (CLARITIN) 10 MG tablet Take 1 tablet (10 mg total) by mouth daily.   losartan (COZAAR) 25 MG tablet Take 1 tablet (25 mg) by mouth once daily (Patient not taking: Reported on 02/15/2021)   metoprolol tartrate (LOPRESSOR) 25 MG tablet Take 1 tablet (25 mg) by mouth twice daily   OXYGEN Inhale 2-3 L into the lungs daily.   rosuvastatin (CRESTOR) 10 MG tablet SMARTSIG:1 Tablet(s) By Mouth Every Evening (Patient not taking: Reported on 02/15/2021)   torsemide (DEMADEX) 10 MG tablet Take 1 tablet (10 mg total) by mouth daily.   TRELEGY ELLIPTA 100-62.5-25 MCG/INH AEPB Inhale 1 puff into the lungs daily.   No facility-administered encounter medications on file as of 06/18/2021.   Reviewed chart for medication changes ahead of medication coordination call.  No OVs, Consults, or hospital visits since last care coordination call.  No medication changes indicated.  BP Readings from Last 3 Encounters:  12/08/20 130/79  11/16/20 120/80  11/03/20 (!) 138/92    Lab Results  Component Value Date   HGBA1C 6.3 07/19/2013     Patient obtains medications through Vials  30 Days   Last adherence delivery included: Trelegy Aer 100 mg Torsemide 10 mg 1 tablet daily  Metoprolol Tar 25 mg 2 tablets for breakfast and evening  Patient declined meds last month: Loratadine 10 mg  1 tablet daily Rosuvastatin 10 mg 1 tablet daily (Patient does not want to take this) Aspirin 81 mg 1 tablet daily (OTC) Ipratropium 0.5 mg  (enough on hand)-Gets supplies from somewhere else) Eliquis 5 mg (Patient does not want to take this, stated he takes aspirin) Spironolactone 25 mg (Patient does not want to take this) Losartan 25 mg 1/2 tablet daily (Patient does not want to take this  Patient is due for next adherence delivery on: 06/30/21.  Called patient and reviewed medications and coordinated delivery.  This delivery to include: Trelegy Aer 100 mg Torsemide 10 mg 1 tablet daily  Metoprolol Tar 25 mg 2 tablets for breakfast and evening  Patient declined the following medications: Loratadine 10 mg  1 tablet daily Rosuvastatin 10 mg 1 tablet daily (Patient does not want to take this) Aspirin 81 mg 1 tablet daily (OTC) Ipratropium 0.5 mg (enough on hand)-Gets supplies from somewhere else) Eliquis 5 mg (Patient does not want to take this, stated he takes aspirin) Spironolactone 25 mg (Patient does not want to take this) Losartan 25 mg 1/2 tablet daily (Patient does not want to take this  Patient does not need refills at this time.   Confirmed delivery date of 06/30/21, advised patient that pharmacy will contact them the morning of delivery.   Follow-Up:Pharmacist Review  Hulen Luster, RMA Clinical Pharmacist Assistant [phone]  5 minutes spent in review, coordination, and documentation.  Reviewed by: Monika Salk, PharmD Clinical Pharmacist [phone]

## 2021-06-22 DIAGNOSIS — J449 Chronic obstructive pulmonary disease, unspecified: Secondary | ICD-10-CM | POA: Diagnosis not present

## 2021-07-05 ENCOUNTER — Telehealth: Payer: Self-pay | Admitting: Student-PharmD

## 2021-07-05 ENCOUNTER — Telehealth: Payer: Self-pay

## 2021-07-05 NOTE — Telephone Encounter (Signed)
As per angela from CCM phar pt is in [place] and need samples advised her to let pt know that samples is ready for pickup

## 2021-07-05 NOTE — Telephone Encounter (Signed)
    Chronic Care Management Clinical Pharmacist  Name: Erik [name]  MRN: [id number] DOB: 06/24/1956  Reason for Encounter: Trelegy Samples  Reached out to patient via phone to let him know that Trelegy samples are waiting for him at front desk. Patient states wife will pick up tomorrow.  This should provide patient with enough inhalers to get through his insurance coverage gap for the year.   Monika Salk, PharmD Clinical Pharmacist [phone]

## 2021-07-07 DIAGNOSIS — I10 Essential (primary) hypertension: Secondary | ICD-10-CM | POA: Diagnosis not present

## 2021-07-07 DIAGNOSIS — J449 Chronic obstructive pulmonary disease, unspecified: Secondary | ICD-10-CM | POA: Diagnosis not present

## 2021-07-17 DIAGNOSIS — J449 Chronic obstructive pulmonary disease, unspecified: Secondary | ICD-10-CM | POA: Diagnosis not present

## 2021-07-17 DIAGNOSIS — R0902 Hypoxemia: Secondary | ICD-10-CM | POA: Diagnosis not present

## 2021-07-18 DIAGNOSIS — J439 Emphysema, unspecified: Secondary | ICD-10-CM | POA: Diagnosis not present

## 2021-07-18 DIAGNOSIS — J449 Chronic obstructive pulmonary disease, unspecified: Secondary | ICD-10-CM | POA: Diagnosis not present

## 2021-07-19 ENCOUNTER — Telehealth: Payer: Self-pay | Admitting: Student-PharmD

## 2021-07-19 NOTE — Progress Notes (Addendum)
  Chronic Care Management Pharmacy Assistant   Name: Erik [name]  MRN: [id number] DOB: [birth year]  Reason for Encounter: Medication Review/Medication Coordination Call  Medications: Outpatient Encounter Medications as of 07/19/2021  Medication Sig   collagenase (SANTYL) ointment Apply 1 application with unna wrap changes   Humidifier MISC Use with O2 at all times   ipratropium-albuterol (DUONEB) 0.5-2.5 (3) MG/3ML SOLN USE 1 VIAL VIA NEBULIZER EVERY 6 HOURS   loratadine (CLARITIN) 10 MG tablet Take 1 tablet (10 mg total) by mouth daily.   losartan (COZAAR) 25 MG tablet Take 1 tablet (25 mg) by mouth once daily (Patient not taking: Reported on 02/15/2021)   metoprolol tartrate (LOPRESSOR) 25 MG tablet Take 1 tablet (25 mg) by mouth twice daily   OXYGEN Inhale 2-3 L into the lungs daily.   rosuvastatin (CRESTOR) 10 MG tablet SMARTSIG:1 Tablet(s) By Mouth Every Evening (Patient not taking: Reported on 02/15/2021)   torsemide (DEMADEX) 10 MG tablet Take 1 tablet (10 mg total) by mouth daily.   TRELEGY ELLIPTA 100-62.5-25 MCG/INH AEPB Inhale 1 puff into the lungs daily.   No facility-administered encounter medications on file as of 07/19/2021.   Reviewed chart for medication changes ahead of medication coordination call.  No OVs, Consults, or hospital visits since last care coordination call.  No medication changes indicated.  BP Readings from Last 3 Encounters:  12/08/20 130/79  11/16/20 120/80  11/03/20 (!) 138/92    Lab Results  Component Value Date   HGBA1C 6.3 07/19/2013     Patient obtains medications through Vials  30 Days   Last adherence delivery included: Torsemide 10 mg 1 tablet daily  Metoprolol Tar 25 mg 2 tablets for breakfast and evening  Patient declined meds last month: Loratadine 10 mg  1 tablet daily Rosuvastatin 10 mg 1 tablet daily (Patient does not want to take this) Aspirin 81 mg 1 tablet daily (OTC) Ipratropium 0.5 mg (enough on hand)-Gets supplies  from somewhere else) Eliquis 5 mg (Patient does not want to take this, stated he takes aspirin) Spironolactone 25 mg (Patient does not want to take this) Losartan 25 mg 1/2 tablet daily (Patient does not want to take this  Patient is due for next adherence delivery on: No medications being delivered.  Called patient and reviewed medications and coordinated delivery.  This delivery to include: None.  Patient declined the following medications: Trelegy Aer 100 mg (Samples given) Torsemide 10 mg 1 tablet daily (Patient stated he has enough at home) Metoprolol Tar 25 mg 2 tablets for breakfast and evening (Patient stated he has enough at home) Loratadine 10 mg  1 tablet daily (AS NEEDED) Rosuvastatin 10 mg 1 tablet daily (Patient does not want to take this) Aspirin 81 mg 1 tablet daily (OTC) Ipratropium 0.5 mg (enough on hand)-Gets supplies from somewhere else) Eliquis 5 mg (Patient does not want to take this, stated he takes aspirin) Spironolactone 25 mg (Patient does not want to take this) Losartan 25 mg 1/2 tablet daily (Patient does not want to take this)  Patient does not needs refills at this time.   Follow-Up:Pharmacist Review  Hulen Luster, RMA Clinical Pharmacist Assistant [phone]  5 minutes spent in review, coordination, and documentation.  Reviewed by: Monika Salk, PharmD Clinical Pharmacist [phone]

## 2021-07-21 ENCOUNTER — Ambulatory Visit: Payer: Medicare Other | Admitting: Student-PharmD

## 2021-07-21 ENCOUNTER — Other Ambulatory Visit: Payer: Self-pay

## 2021-07-21 DIAGNOSIS — I10 Essential (primary) hypertension: Secondary | ICD-10-CM

## 2021-07-21 DIAGNOSIS — J449 Chronic obstructive pulmonary disease, unspecified: Secondary | ICD-10-CM

## 2021-07-21 NOTE — Progress Notes (Signed)
Follow Up CCM Pharmacist Visit  Erik [name], Erik [name] [id number] 65 years, Male  DOB: 03-29-1956  M: [phone]  Summary: Patient reports pain so severe that he is unwilling to leave house or move around. Urged patient to please make an appt with staff to evaluate concern. Reminded patient he is due for AWV Patient BP and HR is concerning today at 128/109, HR 199. Informed patient of stroke/heart attack risk and counseled on signs/symptoms  Recommendations/Changes made from today's visit: Please schedule office visit with PCP  Plan: F/U 4/23   Patient Chart Prep  Completed by Hulen Luster on 07/20/2021  Chronic Conditions Patient's Chronic Conditions: CAD, Atrial Fibrillation, Hypertension (HTN), Chronic Obstructive Pulmonary Disease (COPD), Hyperlipidemia/Dyslipidemia (HLD) List Other Conditions (separated by comma):  Doctor and Hospital Visits Were there PCP Visits since last visit with the Pharmacist?: No Were there Specialist Visits since last visit with the Pharmacist?: No Was there a Hospital Visit in last 30 days?: No Were there other Hospital Visits since last visit with the Pharmacist?: No  Medication Information Have there been any medication changes from PCP or Specialist since last visit with the Pharmacist?: No Are there any Medication adherence gaps (beyond 5 days past due)?: Yes Details: Patient is not complaint with taking his medications.  Medication adherence rates for the STAR rating drugs:  Rosuvastatin 10 mg 30 DS 12/01/20 Losartan 25 mg 30 DS 12/01/2020 List Patient's current Care Gaps: Need A1c Documented, No current Care Gaps identified, Need BP Documented, Need Statin Therapy, Need Colorectal Cancer Screening  Pre-Call Questions  Are you able to connect with Patient?: Yes Visit Type: Phone Call May we confirm what is the F[place] phone # for the pharmacist to call you?: [phone] Have you been in the hospital or emergency room recently?: No Do you  have any questions or concerns that you want to make sure your pharmacist addresses with your during your appointment?: No What, if any, problems do you have getting your medications from the pharmacy?: Financial barriers Details: Patient does have some concerns about the pricing of some of this medications.  Is there anything else that you would like me to pass along to your pharmacist or PCP?: No Patient reminded to bring medication bottles to visit (not a list of meds) OR have medication bottles pulled out prior to appointment time: Done  Disease Assessments  Subjective Information Current BP: 130/79 Current HR: 96 taken on: 12/07/2020 Weight: 212 BMI: 30.42 kg/m Last GFR: 60 taken on: 11/01/2020  Hypertension (HTN) Assess this condition today?: Yes Is patient able to obtain BP reading today?: Yes BP today is: 128/109 Heart Rate is: 199 Goal: <130/80 mmHG Hypertension Stage: Stage 2 (SBP >140 or DBP > 90) Patient has tried and failed: Losartan - states gave him weird nightmares Is Patient checking BP at home?: Yes Patient home BP readings are ranging: Does not take daily, says sometimes it is "low" and sometimes it is "high" How often does patient miss taking their blood pressure medications?: Patient reports no missed doses of Metoprolol Has patient experienced hypotension, dizziness, falls or bradycardia?: No Check present secondary causes (below) for HTN: Obesity Does Patient use RPM device?: No BP RPM device: Does patient qualify?: No We discussed: DASH diet:  following a diet emphasizing fruits and vegetables and low-fat dairy products along with whole grains, fish, poultry, and nuts. Reducing red meats and sugars., Targeting 150 minutes of aerobic activity per week, Reducing the amount of salt intake to 1500mg /per day. Assessment:: Uncontrolled Drug:  Metoprolol Tartrate 25mg  1 tablet twice daily Assessment: Appropriate, Effective, Safe, Accessible Additional Info: Pt  states he is adherent but refill dates are late, suggesting nonadherence. Informed patient that he needed to be seen at office for AWV and needs to check his blood pressure when having headaches. Counseled patient on risk of heart attack or stroke with SBP>160 and DBP>100 with elevated heart rate. HC Follow up: 3 months Pharmacist Follow up: 4/23  Chronic Obstructive Pulmonary Disease (COPD) Current FEV1/FVC: 47% Current FEV1: 1.2 taken on: 09/15/2020 Current Eosinophils: 0.1 taken on: 11/01/2020 Assess this condition today?: Yes Gold group: A (low sx, < 2 exacerbations / yr) Exacerbations since last visit with pharmacist?: No Has there been change in patients smoking/vaping habit since last visit with pharmacist?: No Home oxygen therapy: Yes Details: 2-3 L daily Frequency of SABA/SAMA use: Infrequently Assessment:: Controlled Drug: Trelegy 100-62.5-25 1 puff daily Assessment: Appropriate, Effective, Safe, Accessible Drug: Duoneb 0.5-2.5 1 vial every 6 hours as needed Assessment: Appropriate, Effective, Safe, Accessible Plan to (other): Continue current medication therapy HC Follow up: 3 months Pharmacist Follow up: 4/23  Pain Assess this condition today?: Yes Patient has tried and failed: Tylenol, Ibuprofen, Heating Pad Other Information: Patient reports pain is so severe that he cannot walk or get out of house. Assessment:: Uncontrolled Drug: N/A Assessment: Query need Additional Info: Instructed patient to contact office for AWV to evaluate pain HC Follow up: 3 months Pharmacist Follow up: 4/23  Exercise, Diet and Non-Drug Coordination Needs Additional exercise counseling points. We discussed: decreasing sedentary behavior Discussed Non-Drug Care Coordination Needs: Yes Does Patient have Medication financial barriers?: No  Accountable [facility] Health-Related Social Needs Screening Tool -  SDOH  What is your living situation today? (ref #1): I have a steady  place to live Think about the place you live. Do you have problems with any of the following? (ref #2): None of the above Within the past 12 months, you worried that your food would run out before you got money to buy more (ref #3): Never true Within the past 12 months, the food you bought just didn't last and you didn't have money to get more (ref #4): Never true In the past 12 months, has lack of reliable transportation kept you from medical appointments, meetings, work or from getting things needed for daily living? (ref #5): No In the past 12 months, has the electric, gas, oil, or water company threatened to shut off services in your home? (ref #6): No How often does anyone, including family and friends, physically hurt you? (ref #7): Never (1) How often does anyone, including family and friends, insult or talk down to you? (ref #8): Never (1) How often does anyone, including friends and family, threaten you with harm? (ref #9): Never (1) How often does anyone, including family and friends, scream or curse at you? (ref #10): Never (1)  COMPREHENSIVE CARE PLAN AND GOALS:     HYPERTENSION   OFFICE VISIT BLOOD PRESSURE:   128/109  HOME BLOOD PRESSURE MONITORING: Checks occasionally  MY GOAL BLOOD PRESSURE: <130/80 mmHg  CURRENT MEDICATION AND DOSING:  Metoprolol Tartrate 25mg  - take 1 tablet by mouth daily   BARRIERS TO ACHIEVING GOALS: Medication adherence   PLAN:  Continue current medications as prescribed.  Please make Office visit      COPD  LAST SPIROMETRY SCORE/DATE:     Severely decreased 10/2019  CURRENT MEDICATION AND DOSING:  Trelegy 100-62.5-25mg  1 puff daily Oxygen 2-3L/day Duoneb 0.5-2.5 1 vial via nebulizer every 6 hours as needed  THE GOALS WE HAVE CHOSEN ARE: Prevent worsening of shortness of breath and hospitalizations.  BARRIERS TO ACHIEVING GOALS: None identified  PLAN TO WORK ON THESE GOALS: Continue current  medications as prescribed.    HEALTHY HABITS (Diet and exercise)  CURRENT DIET/EXERCISE: No exercise  THE GOALS WE HAVE CHOSEN ARE:   Stay active, engage in at least 150 minutes per week of moderate-intensity exercise such as brisk walking (15- to 20-minute mile) or something similar. Increase flexibility exercises, break up prolonged periods of sitting Increase seafood, lean meats, whole grains, legumes, nuts, fruits (for dessert), and vegetables. Limit salt intake, sugars, carbs, and red meat, refined and processed foods.  BARRIERS TO ACHIEVING GOALS:  Severe Leg Pain  PLAN TO WORK ON THESE GOALS:  Make appt for AWV to be evaulated    ACTIVE MEDICATION LIST  MEDICATION DOSE DIRECTIONS CONDITION NOTES  Duoneb 0.5-2.5 1 vial every 6 hours as needed via nebulizer COPD   Loratadine 10mg  1 tab daily Allergies   Metoprolol tartrate 25mg  1 tab twice daily Blood pressure   Rosuvastatin 10mg  1 tab daily Cholesterol   Torsemide  10mg  1 tab daily fluid   Trelegy 100-62.6-25 1 puff daily COPD   Oxygen 2-3L Daily COPD                MEDICATION REVIEW  MEDICATION REVIEW CONDUCTED:   Yes DATE:  07/21/2021 [place] POSSIBLE MEDICATION HISTORY SOURCE:   Medical Records  PHARMACY  VIALS OR PACKS: Vials  UPSTREAM      ALLERGIES/INTOLERANCES   NAME OF MEDICATION  REACTION    Benadrayl  Shortness of breath  Morphine Unknown     CURRENT HEALTHCARE PROVIDER TEAM   PROVIDER/TEAM MEMBER  ROLE  PHONE NUMBER  COMMENTS    Primary Care Provider [phone]    10-28-2001 Clinical Pharmacist [phone]              Managing Your Hypertension Hypertension, also called high blood pressure, is when the force of the blood pressing against the walls of the arteries is too strong. Arteries are blood vessels that carry blood from your heart throughout your body. Hypertension forces the heart to work harder to pump blood and may cause the arteries to become narrow or stiff. Understanding blood  pressure readings Your personal target blood pressure may vary depending on your medical conditions, your age, and other factors. A blood pressure reading includes a higher number over a lower number. Ideally, your blood pressure should be below 120/80. You should know that: The first, or top, number is called the systolic pressure. It is a measure of the pressure in your arteries as your heart beats. The second, or bottom number, is called the diastolic pressure. It is a measure of the pressure in your arteries as the heart relaxes. Blood pressure is classified into four stages. Based on your blood pressure reading, your health care provider may use the following stages to determine what type of treatment you need, if any. Systolic pressure and diastolic pressure are measured in a unit called mmHg. Normal Systolic pressure: below 120. Diastolic pressure: below 80. Elevated Systolic pressure: 120-129. Diastolic pressure: below 80. Hypertension stage 1 Systolic pressure: 130-139. Diastolic pressure: 80-89. Hypertension stage 2 Systolic pressure: 140 or above. Diastolic pressure: 90 or above. How can this condition affect me? Managing your hypertension  is an important responsibility. Over time, hypertension can damage the arteries and decrease blood flow to important parts of the body, including the brain, heart, and kidneys. Having untreated or uncontrolled hypertension can lead to: A heart attack. A stroke. A weakened blood vessel (aneurysm). Heart failure. Kidney damage. Eye damage. Metabolic syndrome. Memory and concentration problems. Vascular dementia. What actions can I take to manage this condition? Hypertension can be managed by making lifestyle changes and possibly by taking medicines. Your health care provider will help you make a plan to bring your blood pressure within a normal range. Nutrition Eat a diet that is high in fiber and potassium, and low in salt (sodium), added  sugar, and fat. An example eating plan is called the Dietary Approaches to Stop Hypertension (DASH) diet. To eat this way: Eat plenty of fresh fruits and vegetables. Try to fill one-half of your plate at each meal with fruits and vegetables. Eat whole grains, such as whole-wheat pasta, brown rice, or whole-grain bread. Fill about one-fourth of your plate with whole grains. Eat low-fat dairy products. Avoid fatty cuts of meat, processed or cured meats, and poultry with skin. Fill about one-fourth of your plate with lean proteins such as fish, chicken without skin, beans, eggs, and tofu. Avoid pre-made and processed foods. These tend to be higher in sodium, added sugar, and fat. Reduce your daily sodium intake. Most people with hypertension should eat less than 1,500 mg of sodium a day. Lifestyle Work with your health care provider to maintain a healthy body weight or to lose weight. Ask what an ideal weight is for you. Get at least 30 minutes of exercise that causes your heart to beat faster (aerobic exercise) most days of the week. Activities may include walking, swimming, or biking. Include exercise to strengthen your muscles (resistance exercise), such as weight lifting, as part of your weekly exercise routine. Try to do these types of exercises for 30 minutes at least 3 days a week. Do not use any products that contain nicotine or tobacco, such as cigarettes, e-cigarettes, and chewing tobacco. If you need help quitting, ask your health care provider. Control any long-term (chronic) conditions you have, such as high cholesterol or diabetes. Identify your sources of stress and find ways to manage stress. This may include meditation, deep breathing, or making time for fun activities. Alcohol use Do not drink alcohol if: Your health care provider tells you not to drink. You are pregnant, may be pregnant, or are planning to become pregnant. If you drink alcohol: Limit how much you use to: 0-1 drink  a day for women. 0-2 drinks a day for men. Be aware of how much alcohol is in your drink. In the [place] one drink equals one 12 oz bottle of beer (355 mL), one 5 oz glass of wine (148 mL), or one 1 oz glass of hard liquor (44 mL). Medicines Your health care provider may prescribe medicine if lifestyle changes are not enough to get your blood pressure under control and if: Your systolic blood pressure is 130 or higher. Your diastolic blood pressure is 80 or higher. Take medicines only as told by your health care provider. Follow the directions carefully. Blood pressure medicines must be taken as told by your health care provider. The medicine does not work as well when you skip doses. Skipping doses also puts you at risk for problems. Monitoring Before you monitor your blood pressure: Do not smoke, drink caffeinated beverages, or exercise within 30 minutes  before taking a measurement. Use the bathroom and empty your bladder (urinate). Sit quietly for at least 5 minutes before taking measurements. Monitor your blood pressure at home as told by your health care provider. To do this: Sit with your back straight and supported. Place your feet flat on the floor. Do not cross your legs. Support your arm on a flat surface, such as a table. Make sure your upper arm is at heart level. Each time you measure, take two or three readings one minute apart and record the results. You may also need to have your blood pressure checked regularly by your health care provider. General information Talk with your health care provider about your diet, exercise habits, and other lifestyle factors that may be contributing to hypertension. Review all the medicines you take with your health care provider because there may be side effects or interactions. Keep all visits as told by your health care provider. Your health care provider can help you create and adjust your plan for managing your high blood pressure. Where to  find more information National Heart, Lung, and Blood Institute: PopSte[employer].is American Heart Association: www.heart.org Contact a health care provider if: You think you are having a reaction to medicines you have taken. You have repeated (recurrent) headaches. You feel dizzy. You have swelling in your ankles. You have trouble with your vision. Get help right away if: You develop a severe headache or confusion. You have unusual weakness or numbness, or you feel faint. You have severe pain in your chest or abdomen. You vomit repeatedly. You have trouble breathing. These symptoms may represent a serious problem that is an emergency. Do not wait to see if the symptoms will go away. Get medical help right away. Call your local emergency services (911 in the [place] Do not drive yourself to the hospital. Summary Hypertension is when the force of blood pumping through your arteries is too strong. If this condition is not controlled, it may put you at risk for serious complications. Your personal target blood pressure may vary depending on your medical conditions, your age, and other factors. For most people, a normal blood pressure is less than 120/80. Hypertension is managed by lifestyle changes, medicines, or both. Lifestyle changes to help manage hypertension include losing weight, eating a healthy, low-sodium diet, exercising more, stopping smoking, and limiting alcohol. This information is not intended to replace advice given to you by your health care provider. Make sure you discuss any questions you have with your health care provider.

## 2021-07-22 ENCOUNTER — Telehealth: Payer: Self-pay | Admitting: Student-PharmD

## 2021-07-22 NOTE — Progress Notes (Signed)
°  Chronic Care Management Pharmacy Assistant   Name: Erik [name]  MRN: [id number] DOB: July 24, 1956  Reason for Encounter: CCM Care Plan  Medications: Outpatient Encounter Medications as of 07/22/2021  Medication Sig   collagenase (SANTYL) ointment Apply 1 application with unna wrap changes   Humidifier MISC Use with O2 at all times   ipratropium-albuterol (DUONEB) 0.5-2.5 (3) MG/3ML SOLN USE 1 VIAL VIA NEBULIZER EVERY 6 HOURS   loratadine (CLARITIN) 10 MG tablet Take 1 tablet (10 mg total) by mouth daily.   losartan (COZAAR) 25 MG tablet Take 1 tablet (25 mg) by mouth once daily (Patient not taking: Reported on 02/15/2021)   metoprolol tartrate (LOPRESSOR) 25 MG tablet Take 1 tablet (25 mg) by mouth twice daily   OXYGEN Inhale 2-3 L into the lungs daily.   rosuvastatin (CRESTOR) 10 MG tablet SMARTSIG:1 Tablet(s) By Mouth Every Evening (Patient not taking: Reported on 02/15/2021)   torsemide (DEMADEX) 10 MG tablet Take 1 tablet (10 mg total) by mouth daily.   TRELEGY ELLIPTA 100-62.5-25 MCG/INH AEPB Inhale 1 puff into the lungs daily.   No facility-administered encounter medications on file as of 07/22/2021.   Reviewed the patients initial visit reinsured it was completed per the pharmacist Monika Salk request. Printed the CCM care plan. Mailed the patient CCM care plan to their most recent address on file.  Follow-Up: Pharmacist Review  Erik [name], RMA Clinical Pharmacist Assistant [phone]

## 2021-08-06 DIAGNOSIS — J449 Chronic obstructive pulmonary disease, unspecified: Secondary | ICD-10-CM | POA: Diagnosis not present

## 2021-08-06 DIAGNOSIS — I10 Essential (primary) hypertension: Secondary | ICD-10-CM | POA: Diagnosis not present

## 2021-08-17 DIAGNOSIS — R0902 Hypoxemia: Secondary | ICD-10-CM | POA: Diagnosis not present

## 2021-08-17 DIAGNOSIS — J449 Chronic obstructive pulmonary disease, unspecified: Secondary | ICD-10-CM | POA: Diagnosis not present

## 2021-08-18 DIAGNOSIS — J439 Emphysema, unspecified: Secondary | ICD-10-CM | POA: Diagnosis not present

## 2021-08-18 DIAGNOSIS — J449 Chronic obstructive pulmonary disease, unspecified: Secondary | ICD-10-CM | POA: Diagnosis not present

## 2021-08-19 ENCOUNTER — Telehealth: Payer: Self-pay | Admitting: Student-PharmD

## 2021-08-19 NOTE — Progress Notes (Addendum)
°  Chronic Care Management Pharmacy Assistant   Name: Erik [name]  MRN: [id number] DOB: 1956-08-06   Reason for Encounter: Medication Review/Medication Coordination Call  Medications: Outpatient Encounter Medications as of 08/19/2021  Medication Sig   collagenase (SANTYL) ointment Apply 1 application with unna wrap changes   Humidifier MISC Use with O2 at all times   ipratropium-albuterol (DUONEB) 0.5-2.5 (3) MG/3ML SOLN USE 1 VIAL VIA NEBULIZER EVERY 6 HOURS   loratadine (CLARITIN) 10 MG tablet Take 1 tablet (10 mg total) by mouth daily.   losartan (COZAAR) 25 MG tablet Take 1 tablet (25 mg) by mouth once daily (Patient not taking: Reported on 02/15/2021)   metoprolol tartrate (LOPRESSOR) 25 MG tablet Take 1 tablet (25 mg) by mouth twice daily   OXYGEN Inhale 2-3 L into the lungs daily.   rosuvastatin (CRESTOR) 10 MG tablet SMARTSIG:1 Tablet(s) By Mouth Every Evening (Patient not taking: Reported on 02/15/2021)   torsemide (DEMADEX) 10 MG tablet Take 1 tablet (10 mg total) by mouth daily.   TRELEGY ELLIPTA 100-62.5-25 MCG/INH AEPB Inhale 1 puff into the lungs daily.   No facility-administered encounter medications on file as of 08/19/2021.   Reviewed chart for medication changes ahead of medication coordination call.  No OVs, Consults, or hospital visits since last Pharmacist visit.  No medication changes indicated.  BP Readings from Last 3 Encounters:  12/08/20 130/79  11/16/20 120/80  11/03/20 (!) 138/92    Lab Results  Component Value Date   HGBA1C 6.3 07/19/2013     Patient obtains medications through Vials  30 Days   Last adherence delivery included:  None.  Patient declined meds last month: Trelegy Aer 100 mg (Samples given) Torsemide 10 mg 1 tablet daily (Patient stated he has enough at home) Metoprolol Tar 25 mg 2 tablets for breakfast and evening (Patient stated he has enough at home) Loratadine 10 mg  1 tablet daily (AS NEEDED) Rosuvastatin 10 mg 1 tablet  daily (Patient does not want to take this) Aspirin 81 mg 1 tablet daily (OTC) Ipratropium 0.5 mg (enough on hand)-Gets supplies from somewhere else) Eliquis 5 mg (Patient does not want to take this, stated he takes aspirin) Spironolactone 25 mg (Patient does not want to take this) Losartan 25 mg 1/2 tablet daily (Patient does not want to take this)  Patient is due for next adherence delivery on: 08/31/20. Called patient and reviewed medications and coordinated delivery.  This delivery to include: Torsemide 10 mg 1 tablet daily Metoprolol Tar 25 mg 2 tablets for breakfast and evening  Patient declined the following medications: Loratadine 10 mg  1 tablet daily (AS NEEDED) Rosuvastatin 10 mg 1 tablet daily (Patient does not want to take this) Aspirin 81 mg 1 tablet daily (OTC) Ipratropium 0.5 mg (enough on hand)-Gets supplies from somewhere else) Eliquis 5 mg (Patient does not want to take this, stated he takes aspirin) Spironolactone 25 mg (Patient does not want to take this) Losartan 25 mg 1/2 tablet daily (Patient does not want to take this) Trelegy Aer 100 mg (Samples given)  Patient does not need refills at this time.  Confirmed delivery date of 08/31/20, advised patient that pharmacy will contact them the morning of delivery.  Charlann Lange, RMA Clinical Pharmacist Assistant [phone]  5 minutes spent in review, coordination, and documentation.  Reviewed by: Alena Bills, PharmD Clinical Pharmacist [phone]

## 2021-09-18 DIAGNOSIS — J439 Emphysema, unspecified: Secondary | ICD-10-CM | POA: Diagnosis not present

## 2021-09-18 DIAGNOSIS — J449 Chronic obstructive pulmonary disease, unspecified: Secondary | ICD-10-CM | POA: Diagnosis not present

## 2021-09-21 ENCOUNTER — Telehealth: Payer: Self-pay | Admitting: Student-PharmD

## 2021-09-21 NOTE — Progress Notes (Addendum)
°  Hypertension (HTN) Review Call   Ghrist,Kylor L [id number] 65 years, Male  DOB: 07/23/1956  M: [phone]  Hypertension Review (HC) Completed by Hulen Luster on 09/21/2021  Chart Review Is the patient enrolled in RPM with BP Monitor?: No  BP #1 reading (last): 130/79 on: 05/02/202  BP #2 reading: 120/80 on: 11/15/2020  BP #3 reading: 138/92 on: 11/02/2020  Any of the last 3 BP > 140/90 mmHg?: No  What recent interventions have been made by any provider to improve the patient's conditions in the last 3 months?: None.  Any recent hospitalizations or ED visits since last visit with CPP?: No  Adherence Review Adherence rates for STAR metric medications: Losartan 25 mg 12/01/20 30 DS Rosuvastatin 10 mg 12/01/20 30 DS  Adherence rates for medications indicated for disease state being reviewed: Losartan 25 mg 12/01/20 30 DS  Does the patient have >5 day gap between last estimated fill dates for any of the above medications?: Yes  Reasons for medication gaps: Losartan 25 mg 12/01/20 30 DS-Patient decided to not take this medication any longer.  Disease State Questions Able to connect with the Patient?: Yes  Is the patient monitoring his/her BP?: Yes  How often are you checking your BP?: occasionally  Home BP Reading #1 (most recent): 115/70  Is the patient having any low BP Readings <90/60?: No  Is the patient having any BP readings above >180/100?: No  Is the patient's average BP>140/90?: No  What is your blood pressure goal?: 120/80  Educate patient to inform proper points on checking BP at home:: Sit with feet flat on the floor, arm at heart level.  What diet changes have you made to improve your Blood Pressure Control?: eating more home-cooked meals, limiting processed foods  What exercise are you doing to improve your Blood Pressure Control?: no formal exercise  Engagement Notes Hulen Luster on 09/21/2021 10:38 AM HC Chart Review: 10 min  09/21/21 HC Assessment call time spent: 11 min 09/21/21  Pharmacist Review Adherence gaps identified?: No Drug Therapy Problems identified?: No Assessment: Controlled  Monika Salk Clinical Pharmacist

## 2021-10-05 DIAGNOSIS — J449 Chronic obstructive pulmonary disease, unspecified: Secondary | ICD-10-CM | POA: Diagnosis not present

## 2021-10-05 DIAGNOSIS — I10 Essential (primary) hypertension: Secondary | ICD-10-CM | POA: Diagnosis not present

## 2021-10-16 DIAGNOSIS — J439 Emphysema, unspecified: Secondary | ICD-10-CM | POA: Diagnosis not present

## 2021-10-16 DIAGNOSIS — J449 Chronic obstructive pulmonary disease, unspecified: Secondary | ICD-10-CM | POA: Diagnosis not present

## 2021-10-19 ENCOUNTER — Other Ambulatory Visit: Payer: Self-pay | Admitting: Family

## 2021-10-19 DIAGNOSIS — I482 Chronic atrial fibrillation, unspecified: Secondary | ICD-10-CM

## 2021-10-19 NOTE — Telephone Encounter (Signed)
Rx(s) sent to pharmacy electronically.  

## 2021-10-20 ENCOUNTER — Telehealth: Payer: Self-pay | Admitting: Student-PharmD

## 2021-10-20 NOTE — Progress Notes (Signed)
?  Chronic Care Management ?Pharmacy Assistant  ? ?Name: KYANDRE OKRAY  MRN: [id number] DOB: 1956-03-12 ? ?Reason for Encounter: Medication Review/Medication Coordination Call ? ?Medications: ?Outpatient Encounter Medications as of 10/20/2021  ?Medication Sig  ? collagenase (SANTYL) ointment Apply 1 application with unna wrap changes  ? Humidifier MISC Use with O2 at all times  ? ipratropium-albuterol (DUONEB) 0.5-2.5 (3) MG/3ML SOLN USE 1 VIAL VIA NEBULIZER EVERY 6 HOURS  ? loratadine (CLARITIN) 10 MG tablet Take 1 tablet (10 mg total) by mouth daily.  ? losartan (COZAAR) 25 MG tablet Take 1 tablet (25 mg) by mouth once daily (Patient not taking: Reported on 02/15/2021)  ? metoprolol tartrate (LOPRESSOR) 25 MG tablet TAKE TWO TABLETS BY MOUTH EVERY MORNING and TAKE TWO TABLETS BY MOUTH EVERY EVENING  ? OXYGEN Inhale 2-3 L into the lungs daily.  ? rosuvastatin (CRESTOR) 10 MG tablet SMARTSIG:1 Tablet(s) By Mouth Every Evening (Patient not taking: Reported on 02/15/2021)  ? torsemide (DEMADEX) 10 MG tablet TAKE ONE TABLET BY MOUTH ONCE DAILY  ? TRELEGY ELLIPTA 100-62.5-25 MCG/INH AEPB Inhale 1 puff into the lungs daily.  ? ?No facility-administered encounter medications on file as of 10/20/2021.  ? ? ?Reviewed chart for medication changes ahead of medication coordination call. ? ?No OVs, Consults, or hospital visits since last care coordination call. ? ?No medication changes indicated. ? ?BP Readings from Last 3 Encounters:  ?12/08/20 130/79  ?11/16/20 120/80  ?11/03/20 (!) 138/92  ?  ?Lab Results  ?Component Value Date  ? HGBA1C 6.3 07/19/2013  ?  ? ?Patient obtains medications through Vials  30 Days  ? ?Last adherence delivery included:  ?Torsemide 10 mg 1 tablet daily ?Metoprolol Tar 25 mg 2 tablets for breakfast and evening ? ?Patient declined meds last month: ?Loratadine 10 mg  1 tablet daily (AS NEEDED) ?Rosuvastatin 10 mg 1 tablet daily (Patient does not want to take this) ?Aspirin 81 mg 1 tablet daily  (OTC) ?Ipratropium 0.5 mg (enough on hand)-Gets supplies from somewhere else) ?Eliquis 5 mg (Patient does not want to take this, stated he takes aspirin) ?Spironolactone 25 mg (Patient does not want to take this) ?Losartan 25 mg 1/2 tablet daily (Patient does not want to take this) ?Trelegy Aer 100 mg (Samples given) ? ?Patient is due for next adherence delivery on: 10/29/21. ? ?Called patient and reviewed medications and coordinated delivery. ? ?This delivery to include: ?Ipratropium 0.5 mg  ?Torsemide 10 mg 1 tablet daily ?Metoprolol Tar 25 mg 2 tablets for breakfast and evening ? ?Patient declined the following medications: ?Loratadine 10 mg  1 tablet daily (AS NEEDED) ?Rosuvastatin 10 mg 1 tablet daily (Patient does not want to take this) ?Aspirin 81 mg 1 tablet daily (OTC) ?Eliquis 5 mg (Patient does not want to take this, stated he takes aspirin) ?Spironolactone 25 mg (Patient does not want to take this) ?Losartan 25 mg 1/2 tablet daily (Patient does not want to take this) ?Trelegy Aer 100 mg (Samples given) ? ?Patient needs refills for: Torsemide 10 mg 1 tablet daily and ?Metoprolol Tar 25 mg 2 tablets for breakfast and evening-Requested.  ? ?Confirmed delivery date of 10/29/21, advised patient that pharmacy will contact them the morning of delivery. ? ?Hulen Luster, RMA ?Healthcare Concierge  ?[phone] ? ?

## 2021-11-09 ENCOUNTER — Ambulatory Visit (INDEPENDENT_AMBULATORY_CARE_PROVIDER_SITE_OTHER): Payer: Medicare Other | Admitting: Nurse Practitioner

## 2021-11-09 ENCOUNTER — Encounter: Payer: Self-pay | Admitting: Nurse Practitioner

## 2021-11-09 VITALS — BP 113/69 | HR 117 | Temp 98.5°F | Resp 16 | Ht 70.0 in

## 2021-11-09 DIAGNOSIS — L03032 Cellulitis of left toe: Secondary | ICD-10-CM

## 2021-11-09 DIAGNOSIS — Z9189 Other specified personal risk factors, not elsewhere classified: Secondary | ICD-10-CM | POA: Diagnosis not present

## 2021-11-09 DIAGNOSIS — M79605 Pain in left leg: Secondary | ICD-10-CM

## 2021-11-09 DIAGNOSIS — L03116 Cellulitis of left lower limb: Secondary | ICD-10-CM | POA: Diagnosis not present

## 2021-11-09 DIAGNOSIS — M79604 Pain in right leg: Secondary | ICD-10-CM | POA: Diagnosis not present

## 2021-11-09 DIAGNOSIS — I890 Lymphedema, not elsewhere classified: Secondary | ICD-10-CM

## 2021-11-09 DIAGNOSIS — E669 Obesity, unspecified: Secondary | ICD-10-CM

## 2021-11-09 MED ORDER — SULFAMETHOXAZOLE-TRIMETHOPRIM 800-160 MG PO TABS: 2.0000 | ORAL_TABLET | Freq: Two times a day (BID) | ORAL | 0 refills | Status: DC

## 2021-11-09 MED ORDER — TRAMADOL HCL 50 MG PO TABS: 50.0000 mg | ORAL_TABLET | Freq: Three times a day (TID) | ORAL | 0 refills | Status: AC | PRN

## 2021-11-09 NOTE — Progress Notes (Signed)
[facility] ?[address] ?[place], [place] [address] ? ?Internal MEDICINE  ?Office Visit Note ? ?Patient Name: Erik [name] ? [id number]  ?[id number] ? ?Date of Service: 11/09/2021 ? ?Chief Complaint  ?Patient presents with  ? Foot Problem  ?  Blisters on legs and big toe nails are oozing  ? ? ? ?HPI ?Alfonzia presents for an acute sick visit for oozing blisters on legs and purulent drainage from wound on left great toe.  He has a history of bilateral lymphedema with the left leg being worse than the right.  He has had issues in the past with not being able to move his legs when he is laying down which could be a neurologic issue but he is having acute signs of a skin infection at this time that needs to be treated as a priority. ?He has multiple areas on the left leg that are oozing serous drainage on his lower leg.  The left great toe has a fungal nail infection with an open wound under the nail that is oozing purulent drainage.  He denies any fever, chills, change in level of consciousness or other signs of sepsis. ? ? ?Current Medication: ? ?Outpatient Encounter Medications as of 11/09/2021  ?Medication Sig  ? collagenase (SANTYL) ointment Apply 1 application with unna wrap changes  ? Humidifier MISC Use with O2 at all times  ? ipratropium-albuterol (DUONEB) 0.5-2.5 (3) MG/3ML SOLN USE 1 VIAL VIA NEBULIZER EVERY 6 HOURS  ? loratadine (CLARITIN) 10 MG tablet Take 1 tablet (10 mg total) by mouth daily.  ? losartan (COZAAR) 25 MG tablet Take 1 tablet (25 mg) by mouth once daily  ? metoprolol tartrate (LOPRESSOR) 25 MG tablet TAKE TWO TABLETS BY MOUTH EVERY MORNING and TAKE TWO TABLETS BY MOUTH EVERY EVENING  ? OXYGEN Inhale 2-3 L into the lungs daily.  ? rosuvastatin (CRESTOR) 10 MG tablet   ? sulfamethoxazole-trimethoprim (BACTRIM DS) 800-160 MG tablet Take 2 tablets by mouth 2 (two) times daily.  ? torsemide (DEMADEX) 10 MG tablet TAKE ONE TABLET BY MOUTH ONCE DAILY  ? traMADol (ULTRAM) 50 MG tablet Take  1 tablet (50 mg total) by mouth every 8 (eight) hours as needed for up to 5 days for severe pain.  ? TRELEGY ELLIPTA 100-62.5-25 MCG/INH AEPB Inhale 1 puff into the lungs daily.  ? ?No facility-administered encounter medications on file as of 11/09/2021.  ? ? ? ? ?Medical History: ?Past Medical History:  ?Diagnosis Date  ? Arthritis   ? Asthma   ? Atrial fibrillation (HCC)   ? CHF (congestive heart failure) (HCC)   ? COPD (chronic obstructive pulmonary disease) (HCC)   ? Coronary artery disease   ? GERD (gastroesophageal reflux disease)   ? Hypertension   ? Myocardial infarction [facility])   ? Shortness of breath dyspnea   ? ? ? ?Vital Signs: ?BP 113/69   Pulse (!) 117   Temp 98.5 ?F (36.9 ?C)   Resp 16   Ht 5\' 10"  (1.778 m)   SpO2 95%   BMI 30.42 kg/m?  ? ? ?Review of Systems  ?Constitutional:  Negative for chills, fatigue and fever.  ?Respiratory:  Positive for shortness of breath (patient is oxygen dependent). Negative for cough, chest tightness and wheezing.   ?Cardiovascular: Negative.  Negative for chest pain and palpitations.  ?Gastrointestinal: Negative.   ?Musculoskeletal:   ?     Severe bilateral leg pain related to swelling infection and edema.   ? ?Physical Exam ?Vitals  reviewed.  ?Constitutional:   ?   Appearance: Normal appearance.  ?HENT:  ?   Head: Normocephalic and atraumatic.  ?Cardiovascular:  ?   Rate and Rhythm: Regular rhythm. Tachycardia present.  ?Pulmonary:  ?   Effort: Pulmonary effort is normal. No respiratory distress.  ?Musculoskeletal:  ?   Right lower leg: 2+ Pitting Edema present.  ?   Left lower leg: Tenderness present. 3+ Pitting Edema present.  ?   Right ankle: Swelling present. Tenderness present.  ?   Left ankle: Swelling present. Tenderness present.  ?   Left foot: Swelling and tenderness present.  ?Feet:  ?   Left foot:  ?   Skin integrity: Ulcer, skin breakdown and erythema present.  ?   Toenail Condition: Fungal disease present. ?Skin: ?   General: Skin is warm and moist.  ?    Findings: Erythema, rash and wound (open oozing areas on left lower leg) present. Rash is macular.  ? ?    ?   Comments: Open wound under the nail of the left great toe is oozing purulent drainage with foul odor. Serous drainage is oozing from 2 covered open sores of left lower leg. Pitting edema noted bilaterally left>right  ?Neurological:  ?   Mental Status: He is alert and oriented to person, place, and time.  ?Psychiatric:     ?   Mood and Affect: Mood normal.     ?   Behavior: Behavior normal.  ? ? ? ? ?Assessment/Plan: ?1. Cellulitis of great toe of left foot ?High dose Bactrim double strength prescribed for patient to take 2 tablets twice daily for 2 weeks to treat severe cellulitis. ?- sulfamethoxazole-trimethoprim (BACTRIM DS) 800-160 MG tablet; Take 2 tablets by mouth 2 (two) times daily.  Dispense: 56 tablet; Refill: 0 ? ?2. Cellulitis of left lower leg ?See problem #1 ?- sulfamethoxazole-trimethoprim (BACTRIM DS) 800-160 MG tablet; Take 2 tablets by mouth 2 (two) times daily.  Dispense: 56 tablet; Refill: 0 ? ?3. Leg pain, bilateral ?Short-term prescription for tramadol sent to pharmacy to alleviate pain while waiting for the antibiotic to start taking effect ?- traMADol (ULTRAM) 50 MG tablet; Take 1 tablet (50 mg total) by mouth every 8 (eight) hours as needed for up to 5 days for severe pain.  Dispense: 15 tablet; Refill: 0 ? ?4. At risk for osteomyelitis ?The infected wound on the left great toe is at risk for developing osteomyelitis without prompt treatment, high strength antibiotics prescribed, plan to follow-up with patient in 2 weeks and will determine if x-ray is needed to evaluate for infection of the bone. ? ?5. Lymphedema associated with obesity ?Patient has lymphedema and has previously been seen at the wound clinic.  After initial treatment of cellulitis will refer to wound clinic [facility] at [facility] and will also reevaluate for neurology referral for lower extremity neuropathy ? ? ?General  Counseling: Kylor verbalizes understanding of the findings of todays visit and agrees with plan of treatment. I have discussed any further diagnostic evaluation that may be needed or ordered today. We also reviewed his medications today. he has been encouraged to call the office with any questions or concerns that should arise related to todays visit. ? ? ? ?Counseling: ? ? ? ?No orders of the defined types were placed in this encounter. ? ? ?Meds ordered this encounter  ?Medications  ? sulfamethoxazole-trimethoprim (BACTRIM DS) 800-160 MG tablet  ?  Sig: Take 2 tablets by mouth 2 (two) times daily.  ?  Dispense:  56 tablet  ?  Refill:  0  ? traMADol (ULTRAM) 50 MG tablet  ?  Sig: Take 1 tablet (50 mg total) by mouth every 8 (eight) hours as needed for up to 5 days for severe pain.  ?  Dispense:  15 tablet  ?  Refill:  0  ?  No refills  ? ? ?Return in about 2 weeks (around 11/23/2021) for F/U cellulitis, in person but will change to virtual if unable to physically come in the office. ? ?[place] Controlled Substance Database was reviewed by me for overdose risk score (ORS) ? ?Time spent:30 Minutes ?Time spent with patient included reviewing progress notes, labs, imaging studies, and discussing plan for follow up.  ? ?This patient was seen by Sallyanne KusterAlyssa Eboney Claybrook, FNP-C in collaboration with Dr. Beverely RisenFozia Khan as a part of collaborative care agreement. ? ?Ameka Krigbaum R. Tedd SiasAbernathy, MSN, FNP-C ?Internal Medicine ?

## 2021-11-10 ENCOUNTER — Telehealth: Payer: Medicare Other

## 2021-11-16 DIAGNOSIS — J439 Emphysema, unspecified: Secondary | ICD-10-CM | POA: Diagnosis not present

## 2021-11-16 DIAGNOSIS — J449 Chronic obstructive pulmonary disease, unspecified: Secondary | ICD-10-CM | POA: Diagnosis not present

## 2021-11-24 ENCOUNTER — Ambulatory Visit: Payer: Medicare Other | Admitting: Nurse Practitioner

## 2021-12-13 ENCOUNTER — Other Ambulatory Visit: Payer: Self-pay | Admitting: Cardiovascular Disease

## 2021-12-13 DIAGNOSIS — I482 Chronic atrial fibrillation, unspecified: Secondary | ICD-10-CM

## 2021-12-13 NOTE — Telephone Encounter (Signed)
Spoke with the patient. Patient is past due for f/u. He has a hx of cancelling same day or no shows. ?Advised the patient that he would need to schedule an keep an appt in order to receive additional refills. ?Patient sts that he  is not able ambulate in to the office for an appt. Offered a telephone visit. ?Patient agreed. Appt scheduled on 12/17/21 @ 2:20pm. Verified the phone number on which to call. Telehealth consent already on file. ?Adv the patient that he must keep this appt in order to receive medication refills. ?Patient verbalized understanding. ?  ?

## 2021-12-13 NOTE — Telephone Encounter (Signed)
Patient notified of needing to set up an appointment. The patient is requesting refills on his medications and is past due for a visit with Dr. Kirke Corin.   ?The patient is having difficulties with LE edema and is unable to walk. He would like to discuss with a nurse due to needing medications and not sure he can come into the office for a visit.  ?

## 2021-12-16 DIAGNOSIS — J449 Chronic obstructive pulmonary disease, unspecified: Secondary | ICD-10-CM | POA: Diagnosis not present

## 2021-12-16 DIAGNOSIS — J439 Emphysema, unspecified: Secondary | ICD-10-CM | POA: Diagnosis not present

## 2021-12-17 ENCOUNTER — Encounter: Payer: Self-pay | Admitting: Medical

## 2021-12-17 ENCOUNTER — Ambulatory Visit (INDEPENDENT_AMBULATORY_CARE_PROVIDER_SITE_OTHER): Payer: Medicare Other | Admitting: Medical

## 2021-12-17 VITALS — BP 109/67 | HR 75 | Ht 70.0 in | Wt 230.0 lb

## 2021-12-17 DIAGNOSIS — I480 Paroxysmal atrial fibrillation: Secondary | ICD-10-CM

## 2021-12-17 DIAGNOSIS — I4821 Permanent atrial fibrillation: Secondary | ICD-10-CM

## 2021-12-17 DIAGNOSIS — I25118 Atherosclerotic heart disease of native coronary artery with other forms of angina pectoris: Secondary | ICD-10-CM

## 2021-12-17 DIAGNOSIS — E782 Mixed hyperlipidemia: Secondary | ICD-10-CM

## 2021-12-17 DIAGNOSIS — I5022 Chronic systolic (congestive) heart failure: Secondary | ICD-10-CM | POA: Diagnosis not present

## 2021-12-17 NOTE — Patient Instructions (Signed)
Medication Instructions:  ?Your physician has recommended you make the following change in your medication:  ? ?INCREASE Torsemide to 20 mg daily for 3 days. Then RESUME Torsemide 10 mg daily ? ?*If you need a refill on your cardiac medications before your next appointment, please call your pharmacy* ? ? ?Lab Work: ?None ordered ?If you have labs (blood work) drawn today and your tests are completely normal, you will receive your results only by: ?MyChart Message (if you have MyChart) OR ?A paper copy in the mail ?If you have any lab test that is abnormal or we need to change your treatment, we will call you to review the results. ? ? ?Testing/Procedures: ?None ordered ? ? ?Follow-Up: ?At [facility], you and your health needs are our priority.  As part of our continuing mission to provide you with exceptional heart care, we have created designated Provider Care Teams.  These Care Teams include your primary Cardiologist (physician) and Advanced Practice Providers (APPs -  Physician Assistants and Nurse Practitioners) who all work together to provide you with the care you need, when you need it. ? ?We recommend signing up for the patient portal called "MyChart".  Sign up information is provided on this After Visit Summary.  MyChart is used to connect with patients for Virtual Visits (Telemedicine).  Patients are able to view lab/test results, encounter notes, upcoming appointments, etc.  Non-urgent messages can be sent to your provider as well.   ?To learn more about what you can do with MyChart, go to [email or web address].   ? ?Your next appointment:   ?4 week(s) ? ?The format for your next appointment:   ?In Person ONLY ? ?Provider:   ?You may see Lorine Bears, MD or one of the following Advanced Practice Providers on your designated Care Team:   ?Nicolasa Ducking, NP ?Eula Listen, PA-C ?Cadence Fransico Michael, PA-C ? ? ?Other Instructions ?N/A ? ?Important Information About Sugar ? ? ? ? ? ? ?

## 2021-12-17 NOTE — Progress Notes (Signed)
Virtual Visit via Telephone Note   This visit type was conducted due to national recommendations for restrictions regarding the COVID-19 Pandemic (e.g. social distancing) in an effort to limit this patient's exposure and mitigate transmission in our community.  Due to his co-morbid illnesses, this patient is at least at moderate risk for complications without adequate follow up.  This format is felt to be most appropriate for this patient at this time.  The patient did not have access to video technology/had technical difficulties with video requiring transitioning to audio format only (telephone).  All issues noted in this document were discussed and addressed.  No physical exam could be performed with this format.  Please refer to the patient's chart for his  consent to telehealth for [facility].    Date:  12/17/2021   ID:  Erik [name], DOB 1956/05/06, MRN [id number] The patient was identified using 2 identifiers.  Patient Location: Home Provider Location: Office/Clinic   PCP:  Sallyanne KusterAbernathy, Alyssa, NP   [facility] Providers Cardiologist:  Lorine BearsMuhammad Arida, MD {  Evaluation Performed:  Follow-Up Visit  Chief Complaint:  Refills   History of Present Illness:    Darden DatesJohnnie [name] [name] is a 66 y.o. male with coronary artery disease, atrial fibrillation, chronic anticoagulation, HFrEF, COPD on home O2, arthritis, hyperlipidemia presents today for follow-up   Coronary artery disease s/p bare-metal stent2009.  Cardiac catheterization 2016 with patent onto stent with mild in-stent stenosis, chronically occluded proximal RCA with good collateral, moderate mid LAD stenosis and mild LCx disease.   He was previously followed by [facility] Gwen [facility]owalski [facility]. He was hospitalized in 2014 with mild acute pancreatitis and anticoagulation with Pradaxa was discontinued at that time due to compliance issue per documentation. No bleeding event noted.    Previous echocardiogram 08/14/17 EF  55-60% ? 03/13/20 EF 40% (performed at outside facility).   Seen by Dr. Kirke CorinArida 09/29/2020 as a new patient noting worsening exertional dyspnea as well as intermittent tachycardia since an upper respiratory infection in December. He also noted significant bilateral lower extremity edema though no weight gain.  Ventricular rate was not well controlled with EKG showing atrial fibrillation 111 BPM.  Metoprolol 25 mg twice daily was added with plan to optimize HF medications and potentially transition to Toprol and consideration of cardiac cath at follow up.    He was seen in follow-up 11/02/2020.  Echocardiogram was reviewed results indication for cardiac catheterization and he was hesitant at that time.  He noted previous intolerance to pravastatin but was agreeable to try rosuvastatin.  His diltiazem was discontinued due to reduced EF and he was started on losartan as well as metoprolol.  He was last seen 11/16/20 and was not interested in cardiac catheterization. Spironolactone was added.   Today, the patient reports he is unable to come to the visit because he is having trouble walking. Says he can hardly walk. He is using a walker. He denies injuries, unsure if he had a stroke. Has some hip and back pain. Patient saw PCP a month ago and was started on bactrim for cellulitis.He reports lower leg edema. , worse on the right side. He takes torsemide 10mg  daily. Has not tried taking more than that. Weight 230lbs, was down to 212lbs a year ago, however this may because he is sedentary. He denies chest pain. He has chronic SOB. No orthopnea or pnd. He stopped Eliquis due to rash on the arms. Not interested in Xarelto. He takes Aspirin 325mg   daily.   Past Medical History:  Diagnosis Date   Arthritis    Asthma    Atrial fibrillation (HCC)    CHF (congestive heart failure) (HCC)    COPD (chronic obstructive pulmonary disease) (HCC)    Coronary artery disease    GERD (gastroesophageal reflux disease)     Hypertension    Myocardial infarction [facility])    Shortness of breath dyspnea    Past Surgical History:  Procedure Laterality Date   ANGIOPLASTY  2009   CARDIAC CATHETERIZATION N/A 03/18/2015   Procedure: Left Heart Cath with Coronary Angiography;  Surgeon: Lamar Blinks, MD;  Location: [facility] INVASIVE CV LAB;  Service: Cardiovascular;  Laterality: N/A;   CHOLECYSTECTOMY  2005   CORONARY ANGIOGRAM  2009     Current Meds  Medication Sig   aspirin EC 325 MG tablet Take 325 mg by mouth daily.   Humidifier MISC Use with O2 at all times   ipratropium-albuterol (DUONEB) 0.5-2.5 (3) MG/3ML SOLN USE 1 VIAL VIA NEBULIZER EVERY 6 HOURS   loratadine (CLARITIN) 10 MG tablet Take 1 tablet (10 mg total) by mouth daily.   metoprolol tartrate (LOPRESSOR) 25 MG tablet TAKE TWO TABLETS BY MOUTH EVERY MORNING and TAKE TWO TABLETS BY MOUTH EVERY EVENING   OXYGEN Inhale 2-3 [name] into the lungs daily.   torsemide (DEMADEX) 10 MG tablet TAKE ONE TABLET BY MOUTH ONCE DAILY   TRELEGY ELLIPTA 100-62.5-25 MCG/INH AEPB Inhale 1 puff into the lungs daily.     Allergies:   Benadryl [diphenhydramine hcl] and Morphine and related   Social History   Tobacco Use   Smoking status: Former    Years: 25.00    Types: Cigarettes    Quit date: 03/20/1997    Years since quitting: 24.7   Smokeless tobacco: Never  Vaping Use   Vaping Use: Never used  Substance Use Topics   Alcohol use: No   Drug use: No     Family Hx: The patient's family history includes Coronary artery disease in his father; Diabetes in his father; Hyperlipidemia in his father and mother; Hypertension in his father; Stroke in his mother.  ROS:   Please see the history of present illness.     All other systems reviewed and are negative.   Prior CV studies:   The following studies were reviewed today:  Echo 09/30/20 1. Left ventricular ejection fraction, by estimation, is 40 to 45%. The  left ventricle has mild to moderately decreased function.  Left ventricular  endocardial border not optimally defined to evaluate regional wall motion.  Left ventricular diastolic  parameters are indeterminate.   2. Right ventricular systolic function is low normal. The right  ventricular size is normal.   3. Left atrial size was severely dilated.   4. Right atrial size was moderately dilated.   5. The mitral valve is degenerative. Mild mitral valve regurgitation.   6. The aortic valve was not well visualized. Aortic valve regurgitation  is not visualized. Mild aortic valve sclerosis is present, with no  evidence of aortic valve stenosis.   7. The inferior vena cava is normal in size with greater than 50%  respiratory variability, suggesting right atrial pressure of 3 mmHg    LHC 03/2015 Prox RCA lesion, 100% stenosed. Mid LAD lesion, 40% stenosed. Mid Cx to Dist Cx lesion, 30% stenosed.   Assessment The patient has had progressive canadian class 3 anginal symptoms with a high probability stress test with risk factors including high blood pressure  and high cholesterol.   abnormal left ventricular function with ejection fraction of 35%   severe 1 vessel coronary artery disease    There is significant stenosis of right coronary artery   Plan Continue medical management of CAD risk factors, Additional medications for management of angina and No further cardiac intervention at this time  Labs/Other Tests and Data Reviewed:    EKG:  No ECG reviewed.  Recent Labs: No results found for requested labs within last 8760 hours.   Recent Lipid Panel Lab Results  Component Value Date/Time   CHOL 223 (H) 11/02/2020 04:49 PM   CHOL 184 02/19/2020 12:00 AM   CHOL 146 07/18/2013 09:34 AM   TRIG 83 11/02/2020 04:49 PM   TRIG 53 07/18/2013 09:34 AM   HDL 57 11/02/2020 04:49 PM   HDL 49 02/19/2020 12:00 AM   HDL 48 07/18/2013 09:34 AM   CHOLHDL 3.9 11/02/2020 04:49 PM   LDLCALC 149 (H) 11/02/2020 04:49 PM   LDLCALC 115 (H) 02/19/2020 12:00 AM    LDLCALC 87 07/18/2013 09:34 AM   LDLDIRECT 140.3 (H) 11/02/2020 04:49 PM    Wt Readings from Last 3 Encounters:  12/17/21 230 lb (104.3 kg)  11/16/20 212 lb (96.2 kg)  11/03/20 227 lb (103 kg)         Objective:    Vital Signs:  BP 109/67 (BP Location: Left Arm, Patient Position: Sitting, Cuff Size: Normal)   Pulse 75   Ht 5\' 10"  (1.778 m)   Wt 230 lb (104.3 kg)   BMI 33.00 kg/m    VITAL SIGNS:  reviewed  ASSESSMENT & PLAN:    HFrEF CM Echo 09/2020 showed LVEF 40-45%, he denied cardiac cath in the past. Patient reports lower leg edema, worse on the right. He saw PCP last month who gave him antibiotics for cellulitis. He takes torsemide 10mg  daily. Recommended he take torsemide 10mg  BID x 3 days then back down to 10mg  daily. Again, he is not interested in cardiac cath for reduced EF. Continue Lopressor 25mg  BID and Losartan 25mg  daily. CHF education dicussed. I am hesitant to continue GDMT given he may not come in for lab work. I recommend he come into the office at the next visit.  Permanent Afib CHADSVASC of 3 ( CHF, HTN, PAD). He self-discontinued Eliquis for upper extremity arm rash. We discussed other blood thinner options, but he is not interested in this. He is on Aspirin 325mg  daily.   CAD He denies anginal symptoms. No further ischemic work-up at this time. Continue Aspirin, statin and BB therapy.   HLD LDL 149 a year go. I recommended he come in for repeat labs. Continue Crestor 10mg  daily   Time:   Today, I have spent 25 minutes with the patient with telehealth technology discussing the above problems.     Medication Adjustments/Labs and Tests Ordered: Current medicines are reviewed at length with the patient today.  Concerns regarding medicines are outlined above.   Tests Ordered: No orders of the defined types were placed in this encounter.   Medication Changes: No orders of the defined types were placed in this encounter.   Follow Up:  In Person in  1 month(s)  Signed, Korinna Tat 10/2020  12/17/2021 4:33 PM    [facility]

## 2021-12-27 ENCOUNTER — Other Ambulatory Visit: Payer: Self-pay

## 2021-12-27 DIAGNOSIS — J449 Chronic obstructive pulmonary disease, unspecified: Secondary | ICD-10-CM

## 2021-12-27 DIAGNOSIS — I890 Lymphedema, not elsewhere classified: Secondary | ICD-10-CM

## 2021-12-27 DIAGNOSIS — R600 Localized edema: Secondary | ICD-10-CM

## 2021-12-27 MED ORDER — IPRATROPIUM-ALBUTEROL 0.5-2.5 (3) MG/3ML IN SOLN: RESPIRATORY_TRACT | 6 refills | Status: DC

## 2021-12-27 NOTE — Progress Notes (Signed)
Pt needs to be re-seen and sending a new referral to wound clinic

## 2021-12-29 ENCOUNTER — Telehealth: Payer: Medicare Other

## 2021-12-30 ENCOUNTER — Encounter: Payer: Self-pay | Admitting: Nurse Practitioner

## 2022-01-13 ENCOUNTER — Inpatient Hospital Stay
Admission: EM | Admit: 2022-01-13 | Discharge: 2022-01-25 | DRG: 602 | Disposition: A | Discharge status: Skilled Nursing Facility | Payer: Medicare Other | Attending: Internal Medicine | Admitting: Internal Medicine

## 2022-01-13 ENCOUNTER — Emergency Department: Payer: Medicare Other

## 2022-01-13 ENCOUNTER — Ambulatory Visit: Payer: Medicare Other | Admitting: Physician Assistant

## 2022-01-13 ENCOUNTER — Other Ambulatory Visit: Payer: Self-pay

## 2022-01-13 DIAGNOSIS — G8929 Other chronic pain: Secondary | ICD-10-CM | POA: Diagnosis present

## 2022-01-13 DIAGNOSIS — I872 Venous insufficiency (chronic) (peripheral): Secondary | ICD-10-CM | POA: Diagnosis present

## 2022-01-13 DIAGNOSIS — I110 Hypertensive heart disease with heart failure: Secondary | ICD-10-CM | POA: Diagnosis not present

## 2022-01-13 DIAGNOSIS — M4856XA Collapsed vertebra, not elsewhere classified, lumbar region, initial encounter for fracture: Secondary | ICD-10-CM | POA: Diagnosis present

## 2022-01-13 DIAGNOSIS — I2510 Atherosclerotic heart disease of native coronary artery without angina pectoris: Secondary | ICD-10-CM | POA: Diagnosis not present

## 2022-01-13 DIAGNOSIS — J449 Chronic obstructive pulmonary disease, unspecified: Secondary | ICD-10-CM | POA: Diagnosis present

## 2022-01-13 DIAGNOSIS — I70219 Atherosclerosis of native arteries of extremities with intermittent claudication, unspecified extremity: Secondary | ICD-10-CM | POA: Diagnosis not present

## 2022-01-13 DIAGNOSIS — R2689 Other abnormalities of gait and mobility: Secondary | ICD-10-CM | POA: Diagnosis not present

## 2022-01-13 DIAGNOSIS — I252 Old myocardial infarction: Secondary | ICD-10-CM

## 2022-01-13 DIAGNOSIS — M1612 Unilateral primary osteoarthritis, left hip: Secondary | ICD-10-CM | POA: Diagnosis present

## 2022-01-13 DIAGNOSIS — Z9981 Dependence on supplemental oxygen: Secondary | ICD-10-CM

## 2022-01-13 DIAGNOSIS — J439 Emphysema, unspecified: Secondary | ICD-10-CM | POA: Diagnosis present

## 2022-01-13 DIAGNOSIS — M25452 Effusion, left hip: Secondary | ICD-10-CM | POA: Diagnosis not present

## 2022-01-13 DIAGNOSIS — L98499 Non-pressure chronic ulcer of skin of other sites with unspecified severity: Secondary | ICD-10-CM

## 2022-01-13 DIAGNOSIS — L03116 Cellulitis of left lower limb: Secondary | ICD-10-CM | POA: Diagnosis not present

## 2022-01-13 DIAGNOSIS — Z9861 Coronary angioplasty status: Secondary | ICD-10-CM

## 2022-01-13 DIAGNOSIS — I10 Essential (primary) hypertension: Secondary | ICD-10-CM | POA: Diagnosis not present

## 2022-01-13 DIAGNOSIS — I5023 Acute on chronic systolic (congestive) heart failure: Secondary | ICD-10-CM | POA: Diagnosis present

## 2022-01-13 DIAGNOSIS — R531 Weakness: Secondary | ICD-10-CM | POA: Diagnosis not present

## 2022-01-13 DIAGNOSIS — R262 Difficulty in walking, not elsewhere classified: Secondary | ICD-10-CM | POA: Diagnosis present

## 2022-01-13 DIAGNOSIS — L97919 Non-pressure chronic ulcer of unspecified part of right lower leg with unspecified severity: Secondary | ICD-10-CM | POA: Diagnosis present

## 2022-01-13 DIAGNOSIS — Z9181 History of falling: Secondary | ICD-10-CM | POA: Diagnosis not present

## 2022-01-13 DIAGNOSIS — I5022 Chronic systolic (congestive) heart failure: Secondary | ICD-10-CM | POA: Diagnosis not present

## 2022-01-13 DIAGNOSIS — I482 Chronic atrial fibrillation, unspecified: Secondary | ICD-10-CM | POA: Diagnosis present

## 2022-01-13 DIAGNOSIS — R1312 Dysphagia, oropharyngeal phase: Secondary | ICD-10-CM | POA: Diagnosis not present

## 2022-01-13 DIAGNOSIS — Z9049 Acquired absence of other specified parts of digestive tract: Secondary | ICD-10-CM | POA: Diagnosis not present

## 2022-01-13 DIAGNOSIS — G629 Polyneuropathy, unspecified: Secondary | ICD-10-CM | POA: Diagnosis not present

## 2022-01-13 DIAGNOSIS — M79605 Pain in left leg: Secondary | ICD-10-CM | POA: Diagnosis not present

## 2022-01-13 DIAGNOSIS — E782 Mixed hyperlipidemia: Secondary | ICD-10-CM | POA: Diagnosis not present

## 2022-01-13 DIAGNOSIS — B370 Candidal stomatitis: Secondary | ICD-10-CM | POA: Diagnosis not present

## 2022-01-13 DIAGNOSIS — I878 Other specified disorders of veins: Secondary | ICD-10-CM | POA: Diagnosis not present

## 2022-01-13 DIAGNOSIS — Z743 Need for continuous supervision: Secondary | ICD-10-CM | POA: Diagnosis not present

## 2022-01-13 DIAGNOSIS — K5900 Constipation, unspecified: Secondary | ICD-10-CM | POA: Diagnosis not present

## 2022-01-13 DIAGNOSIS — L853 Xerosis cutis: Secondary | ICD-10-CM | POA: Diagnosis not present

## 2022-01-13 DIAGNOSIS — M7989 Other specified soft tissue disorders: Secondary | ICD-10-CM | POA: Diagnosis not present

## 2022-01-13 DIAGNOSIS — Z79899 Other long term (current) drug therapy: Secondary | ICD-10-CM

## 2022-01-13 DIAGNOSIS — I4821 Permanent atrial fibrillation: Secondary | ICD-10-CM | POA: Diagnosis not present

## 2022-01-13 DIAGNOSIS — R609 Edema, unspecified: Secondary | ICD-10-CM

## 2022-01-13 DIAGNOSIS — W1830XA Fall on same level, unspecified, initial encounter: Secondary | ICD-10-CM | POA: Diagnosis present

## 2022-01-13 DIAGNOSIS — L97929 Non-pressure chronic ulcer of unspecified part of left lower leg with unspecified severity: Secondary | ICD-10-CM | POA: Diagnosis not present

## 2022-01-13 DIAGNOSIS — Z7982 Long term (current) use of aspirin: Secondary | ICD-10-CM

## 2022-01-13 DIAGNOSIS — E161 Other hypoglycemia: Secondary | ICD-10-CM | POA: Diagnosis not present

## 2022-01-13 DIAGNOSIS — Z87891 Personal history of nicotine dependence: Secondary | ICD-10-CM

## 2022-01-13 DIAGNOSIS — I499 Cardiac arrhythmia, unspecified: Secondary | ICD-10-CM | POA: Diagnosis not present

## 2022-01-13 DIAGNOSIS — Z885 Allergy status to narcotic agent status: Secondary | ICD-10-CM

## 2022-01-13 DIAGNOSIS — I5031 Acute diastolic (congestive) heart failure: Secondary | ICD-10-CM | POA: Diagnosis not present

## 2022-01-13 DIAGNOSIS — J441 Chronic obstructive pulmonary disease with (acute) exacerbation: Secondary | ICD-10-CM | POA: Diagnosis present

## 2022-01-13 DIAGNOSIS — L03115 Cellulitis of right lower limb: Secondary | ICD-10-CM | POA: Diagnosis not present

## 2022-01-13 DIAGNOSIS — M5136 Other intervertebral disc degeneration, lumbar region: Secondary | ICD-10-CM | POA: Diagnosis present

## 2022-01-13 DIAGNOSIS — M6281 Muscle weakness (generalized): Secondary | ICD-10-CM | POA: Diagnosis not present

## 2022-01-13 DIAGNOSIS — E162 Hypoglycemia, unspecified: Secondary | ICD-10-CM | POA: Diagnosis not present

## 2022-01-13 DIAGNOSIS — M79604 Pain in right leg: Secondary | ICD-10-CM | POA: Diagnosis not present

## 2022-01-13 DIAGNOSIS — L97909 Non-pressure chronic ulcer of unspecified part of unspecified lower leg with unspecified severity: Secondary | ICD-10-CM

## 2022-01-13 DIAGNOSIS — M549 Dorsalgia, unspecified: Secondary | ICD-10-CM | POA: Diagnosis not present

## 2022-01-13 DIAGNOSIS — R600 Localized edema: Secondary | ICD-10-CM | POA: Diagnosis not present

## 2022-01-13 DIAGNOSIS — R2681 Unsteadiness on feet: Secondary | ICD-10-CM | POA: Diagnosis not present

## 2022-01-13 DIAGNOSIS — Z8249 Family history of ischemic heart disease and other diseases of the circulatory system: Secondary | ICD-10-CM

## 2022-01-13 DIAGNOSIS — I509 Heart failure, unspecified: Secondary | ICD-10-CM | POA: Diagnosis not present

## 2022-01-13 DIAGNOSIS — M25552 Pain in left hip: Secondary | ICD-10-CM

## 2022-01-13 DIAGNOSIS — Z8679 Personal history of other diseases of the circulatory system: Secondary | ICD-10-CM | POA: Diagnosis not present

## 2022-01-13 DIAGNOSIS — S22040D Wedge compression fracture of fourth thoracic vertebra, subsequent encounter for fracture with routine healing: Secondary | ICD-10-CM | POA: Diagnosis not present

## 2022-01-13 DIAGNOSIS — Z7401 Bed confinement status: Secondary | ICD-10-CM | POA: Diagnosis not present

## 2022-01-13 DIAGNOSIS — K219 Gastro-esophageal reflux disease without esophagitis: Secondary | ICD-10-CM | POA: Diagnosis present

## 2022-01-13 DIAGNOSIS — J9611 Chronic respiratory failure with hypoxia: Secondary | ICD-10-CM | POA: Diagnosis not present

## 2022-01-13 DIAGNOSIS — I2583 Coronary atherosclerosis due to lipid rich plaque: Secondary | ICD-10-CM | POA: Diagnosis not present

## 2022-01-13 DIAGNOSIS — S32020A Wedge compression fracture of second lumbar vertebra, initial encounter for closed fracture: Secondary | ICD-10-CM | POA: Diagnosis not present

## 2022-01-13 DIAGNOSIS — R6889 Other general symptoms and signs: Secondary | ICD-10-CM | POA: Diagnosis not present

## 2022-01-13 DIAGNOSIS — R0689 Other abnormalities of breathing: Secondary | ICD-10-CM | POA: Diagnosis not present

## 2022-01-13 DIAGNOSIS — Z888 Allergy status to other drugs, medicaments and biological substances status: Secondary | ICD-10-CM

## 2022-01-13 LAB — BASIC METABOLIC PANEL
Anion gap: 4 — ABNORMAL LOW (ref 5–15)
BUN: 17 mg/dL (ref 8–23)
CO2: 30 mmol/L (ref 22–32)
Calcium: 8.4 mg/dL — ABNORMAL LOW (ref 8.9–10.3)
Chloride: 103 mmol/L (ref 98–111)
Creatinine, Ser: 0.68 mg/dL (ref 0.61–1.24)
GFR, Estimated: >60 mL/min (ref >60)
Glucose, Bld: 95 mg/dL (ref 70–99)
Potassium: 4.7 mmol/L (ref 3.5–5.1)
Sodium: 137 mmol/L (ref 135–145)

## 2022-01-13 LAB — CBC WITH DIFFERENTIAL/PLATELET
Abs Immature Granulocytes: 0.05 10*3/uL (ref 0.00–0.07)
Basophils Absolute: 0 10*3/uL (ref 0.0–0.1)
Basophils Relative: 0 %
Eosinophils Absolute: 0.2 10*3/uL (ref 0.0–0.5)
Eosinophils Relative: 2 %
HCT: 44 % (ref 39.0–52.0)
Hemoglobin: 13.8 g/dL (ref 13.0–17.0)
Immature Granulocytes: 1 %
Lymphocytes Relative: 18 %
Lymphs Abs: 1.9 10*3/uL (ref 0.7–4.0)
MCH: 30 pg (ref 26.0–34.0)
MCHC: 31.4 g/dL (ref 30.0–36.0)
MCV: 95.7 fL (ref 80.0–100.0)
Monocytes Absolute: 1.3 10*3/uL — ABNORMAL HIGH (ref 0.1–1.0)
Monocytes Relative: 12 %
Neutro Abs: 7 10*3/uL (ref 1.7–7.7)
Neutrophils Relative %: 67 %
Platelets: 363 10*3/uL (ref 150–400)
RBC: 4.6 MIL/uL (ref 4.22–5.81)
RDW: 13.2 % (ref 11.5–15.5)
WBC: 10.5 10*3/uL (ref 4.0–10.5)
nRBC: 0 % (ref 0.0–0.2)

## 2022-01-13 LAB — HEPATIC FUNCTION PANEL
ALT: 17 U/L (ref 0–44)
AST: 29 U/L (ref 15–41)
Albumin: 3.2 g/dL — ABNORMAL LOW (ref 3.5–5.0)
Alkaline Phosphatase: 64 U/L (ref 38–126)
Bilirubin, Direct: 0.4 mg/dL — ABNORMAL HIGH (ref 0.0–0.2)
Indirect Bilirubin: 0.7 mg/dL (ref 0.3–0.9)
Total Bilirubin: 1.1 mg/dL (ref 0.3–1.2)
Total Protein: 6.2 g/dL — ABNORMAL LOW (ref 6.5–8.1)

## 2022-01-13 LAB — LACTIC ACID, PLASMA
Lactic Acid, Venous: 1.6 mmol/L (ref 0.5–1.9)
Lactic Acid, Venous: 1.4 mmol/L (ref 0.5–1.9)

## 2022-01-13 LAB — TROPONIN I (HIGH SENSITIVITY)
Troponin I (High Sensitivity): 7 ng/L (ref <18)
Troponin I (High Sensitivity): 7 ng/L (ref <18)

## 2022-01-13 LAB — SEDIMENTATION RATE: Sed Rate: 22 mm/hr — ABNORMAL HIGH (ref 0–20)

## 2022-01-13 LAB — BRAIN NATRIURETIC PEPTIDE: B Natriuretic Peptide: 185.2 pg/mL — ABNORMAL HIGH (ref 0.0–100.0)

## 2022-01-13 MED ORDER — HYDROCODONE-ACETAMINOPHEN 5-325 MG PO TABS
1.0000 | ORAL_TABLET | ORAL | Status: DC | PRN
  Administered 2022-01-13: 2 via ORAL
  Administered 2022-01-14: 1 via ORAL
  Administered 2022-01-14: 2 via ORAL
  Filled 2022-01-13: qty 1
  Filled 2022-01-13 (×2): qty 2

## 2022-01-13 MED ORDER — IPRATROPIUM-ALBUTEROL 0.5-2.5 (3) MG/3ML IN SOLN
3.0000 mL | RESPIRATORY_TRACT | Status: DC | PRN
Start: 2022-01-13 — End: 2022-01-25
  Administered 2022-01-14 – 2022-01-25 (×13): 3 mL via RESPIRATORY_TRACT
  Filled 2022-01-13 (×12): qty 3

## 2022-01-13 MED ORDER — SODIUM CHLORIDE 0.9 % IV SOLN
2.0000 g | Freq: Once | INTRAVENOUS | Status: AC
  Administered 2022-01-13: 2 g via INTRAVENOUS
  Filled 2022-01-13: qty 20

## 2022-01-13 MED ORDER — ONDANSETRON HCL 4 MG/2ML IJ SOLN: 4.0000 mg | Freq: Four times a day (QID) | INTRAMUSCULAR | Status: DC | PRN

## 2022-01-13 MED ORDER — HYDROMORPHONE HCL 1 MG/ML IJ SOLN
0.5000 mg | Freq: Once | INTRAMUSCULAR | Status: AC
  Administered 2022-01-13: 0.5 mg via INTRAVENOUS
  Filled 2022-01-13: qty 0.5

## 2022-01-13 MED ORDER — METOPROLOL TARTRATE 5 MG/5ML IV SOLN
2.5000 mg | Freq: Once | INTRAVENOUS | Status: AC
Start: 2022-01-13 — End: 2022-01-13
  Administered 2022-01-13: 2.5 mg via INTRAVENOUS
  Filled 2022-01-13: qty 5

## 2022-01-13 MED ORDER — FUROSEMIDE 10 MG/ML IJ SOLN
40.0000 mg | Freq: Two times a day (BID) | INTRAMUSCULAR | Status: DC
Start: 2022-01-13 — End: 2022-01-14
  Administered 2022-01-14: 40 mg via INTRAVENOUS
  Filled 2022-01-13: qty 4

## 2022-01-13 MED ORDER — ONDANSETRON HCL 4 MG PO TABS
4.0000 mg | ORAL_TABLET | Freq: Four times a day (QID) | ORAL | Status: DC | PRN
  Administered 2022-01-15: 4 mg via ORAL
  Filled 2022-01-13: qty 1

## 2022-01-13 MED ORDER — ONDANSETRON HCL 4 MG/2ML IJ SOLN
4.0000 mg | Freq: Once | INTRAMUSCULAR | Status: AC
  Administered 2022-01-13: 4 mg via INTRAVENOUS
  Filled 2022-01-13: qty 2

## 2022-01-13 MED ORDER — VANCOMYCIN HCL IN DEXTROSE 1-5 GM/200ML-% IV SOLN
1000.0000 mg | Freq: Two times a day (BID) | INTRAVENOUS | Status: DC
  Administered 2022-01-14: 1000 mg via INTRAVENOUS
  Filled 2022-01-13 (×2): qty 200

## 2022-01-13 MED ORDER — VANCOMYCIN HCL 2000 MG/400ML IV SOLN
2000.0000 mg | Freq: Once | INTRAVENOUS | Status: AC
  Administered 2022-01-13: 2000 mg via INTRAVENOUS
  Filled 2022-01-13: qty 400

## 2022-01-13 MED ORDER — METOPROLOL TARTRATE 25 MG PO TABS
25.0000 mg | ORAL_TABLET | Freq: Two times a day (BID) | ORAL | Status: DC
  Administered 2022-01-13 – 2022-01-25 (×24): 25 mg via ORAL
  Filled 2022-01-13 (×24): qty 1

## 2022-01-13 MED ORDER — FUROSEMIDE 10 MG/ML IJ SOLN
40.0000 mg | Freq: Once | INTRAMUSCULAR | Status: AC
  Administered 2022-01-13: 40 mg via INTRAVENOUS
  Filled 2022-01-13: qty 4

## 2022-01-13 MED ORDER — ACETAMINOPHEN 325 MG PO TABS: 650.0000 mg | ORAL_TABLET | Freq: Four times a day (QID) | ORAL | Status: DC | PRN

## 2022-01-13 MED ORDER — ASPIRIN 325 MG PO TBEC
325.0000 mg | DELAYED_RELEASE_TABLET | Freq: Every day | ORAL | Status: DC
  Administered 2022-01-13 – 2022-01-25 (×13): 325 mg via ORAL
  Filled 2022-01-13 (×13): qty 1

## 2022-01-13 MED ORDER — ENOXAPARIN SODIUM 40 MG/0.4ML IJ SOSY: 40.0000 mg | PREFILLED_SYRINGE | INTRAMUSCULAR | Status: DC

## 2022-01-13 MED ORDER — ACETAMINOPHEN 650 MG RE SUPP: 650.0000 mg | Freq: Four times a day (QID) | RECTAL | Status: DC | PRN

## 2022-01-13 MED ORDER — SODIUM CHLORIDE 0.9 % IV SOLN
1.0000 g | INTRAVENOUS | Status: DC
  Administered 2022-01-14 – 2022-01-16 (×3): 1 g via INTRAVENOUS
  Filled 2022-01-13 (×4): qty 10

## 2022-01-13 MED ORDER — ENOXAPARIN SODIUM 60 MG/0.6ML IJ SOSY
0.5000 mg/kg | PREFILLED_SYRINGE | INTRAMUSCULAR | Status: DC
  Administered 2022-01-13 – 2022-01-20 (×8): 52.5 mg via SUBCUTANEOUS
  Filled 2022-01-13 (×8): qty 0.6

## 2022-01-13 NOTE — Assessment & Plan Note (Addendum)
--  started on vanc and ceftriaxone, d/c'ed vanc.  Received 4 days of ceftriaxone and transitioned to augmentin Plan: --cont augmentin to finish 7-day course

## 2022-01-13 NOTE — ED Provider Notes (Signed)
[facility] Provider Note    Event Date/Time   First MD Initiated Contact with Patient 01/13/22 1744     (approximate)   History   Leg Swelling   HPI  Erik [name] is a 66 y.o. male  with h/o HFrEF, COPD, HTN, CAD s/p MI, here with leg pain. Reports over the past week, he's had increasing bilateral, R>L swelling. R leg has gotten more painful and red, swollen as well. He has also had worsening cough, orthopnea, sputum production. Has had difficulty getting around due to his leg swelling and pain. Denies known fevers. No drainage. Has not necessarily been taking his meds as prescribed. Was seen by Cardiology last 12/2021, they requested he be evaluated in person and pt reiterated he was not interested in cath for his ongoing CP and reduced EF.Refuses anticoagulation for his AFib as well.       Physical Exam   Triage Vital Signs: ED Triage Vitals  Enc Vitals Group     BP 01/13/22 1559 (!) 140/91     Pulse Rate 01/13/22 1559 97     Resp 01/13/22 1559 18     Temp 01/13/22 1559 98.1 F (36.7 C)     Temp Source 01/13/22 1559 Oral     SpO2 01/13/22 1559 100 %     Weight 01/13/22 1559 235 lb (106.6 kg)     Height 01/13/22 1559 5' 10"  (1.778 m)     Head Circumference --      Peak Flow --      Pain Score 01/13/22 1606 5     Pain Loc --      Pain Edu? --      Excl. in [place]? --     Most recent vital signs: Vitals:   01/13/22 2323 01/13/22 2336  BP: (!) 141/128 131/83  Pulse: (!) 107   Resp: 20 17  Temp: 98.2 F (36.8 C)   SpO2: 95% 96%     General: Awake, no distress.  CV:  Good peripheral perfusion. RRR. Resp:  Normal effort. Mild basilar rales noted. Abd:  No distention. No tenderness. Other:  3+ pitting edema b/l LE. Marked erythema bilaterally, particularly on LLE with streaking along medial and upper thigh. Superficial excoriations/ulcers with drainage, no purulence.    ED Results / Procedures / Treatments   Labs (all labs ordered are  listed, but only abnormal results are displayed) Labs Reviewed  BASIC METABOLIC PANEL - Abnormal; Notable for the following components:      Result Value   Calcium 8.4 (*)    Anion gap 4 (*)    All other components within normal limits  CBC WITH DIFFERENTIAL/PLATELET - Abnormal; Notable for the following components:   Monocytes Absolute 1.3 (*)    All other components within normal limits  BRAIN NATRIURETIC PEPTIDE - Abnormal; Notable for the following components:   B Natriuretic Peptide 185.2 (*)    All other components within normal limits  SEDIMENTATION RATE - Abnormal; Notable for the following components:   Sed Rate 22 (*)    All other components within normal limits  HEPATIC FUNCTION PANEL - Abnormal; Notable for the following components:   Total Protein 6.2 (*)    Albumin 3.2 (*)    Bilirubin, Direct 0.4 (*)    All other components within normal limits  CULTURE, BLOOD (ROUTINE X 2)  CULTURE, BLOOD (ROUTINE X 2)  LACTIC ACID, PLASMA  LACTIC ACID, PLASMA  CREATININE, SERUM  HIV ANTIBODY (  ROUTINE TESTING W REFLEX)  TROPONIN I (HIGH SENSITIVITY)  TROPONIN I (HIGH SENSITIVITY)     EKG    RADIOLOGY CXR: No active disease [place] Bilateral: No DVT.   I also independently reviewed and agree with radiologist interpretations.   PROCEDURES:  Critical Care performed: No  .1-3 Lead EKG Interpretation  Performed by: Duffy Bruce, MD Authorized by: Duffy Bruce, MD     Interpretation: normal     ECG rate:  80-100   ECG rate assessment: normal     Rhythm: sinus rhythm     Ectopy: none     Conduction: normal   Comments:     Indication: Weakness     MEDICATIONS ORDERED IN ED: Medications  aspirin EC tablet 325 mg (325 mg Oral Given 01/13/22 2350)  metoprolol tartrate (LOPRESSOR) tablet 25 mg (25 mg Oral Given 01/13/22 2349)  ipratropium-albuterol (DUONEB) 0.5-2.5 (3) MG/3ML nebulizer solution 3 mL (has no administration in time range)  acetaminophen (TYLENOL)  tablet 650 mg (has no administration in time range)    Or  acetaminophen (TYLENOL) suppository 650 mg (has no administration in time range)  ondansetron (ZOFRAN) tablet 4 mg (has no administration in time range)    Or  ondansetron (ZOFRAN) injection 4 mg (has no administration in time range)  furosemide (LASIX) injection 40 mg (40 mg Intravenous Not Given 01/13/22 2232)  HYDROcodone-acetaminophen (NORCO/VICODIN) 5-325 MG per tablet 1-2 tablet (2 tablets Oral Given 01/13/22 2350)  cefTRIAXone (ROCEPHIN) 1 g in sodium chloride 0.9 % 100 mL IVPB (has no administration in time range)  enoxaparin (LOVENOX) injection 52.5 mg (52.5 mg Subcutaneous Given 01/13/22 2349)  vancomycin (VANCOCIN) IVPB 1000 mg/200 mL premix (has no administration in time range)  cefTRIAXone (ROCEPHIN) 2 g in sodium chloride 0.9 % 100 mL IVPB (0 g Intravenous Stopped 01/13/22 2037)  vancomycin (VANCOREADY) IVPB 2000 mg/400 mL (0 mg Intravenous Stopped 01/14/22 0047)  ondansetron (ZOFRAN) injection 4 mg (4 mg Intravenous Given 01/13/22 1936)  HYDROmorphone (DILAUDID) injection 0.5 mg (0.5 mg Intravenous Given 01/13/22 1938)  furosemide (LASIX) injection 40 mg (40 mg Intravenous Given 01/13/22 2108)  metoprolol tartrate (LOPRESSOR) injection 2.5 mg (2.5 mg Intravenous Given 01/13/22 2108)     IMPRESSION / MDM / ASSESSMENT AND PLAN / ED COURSE  I reviewed the triage vital signs and the nursing notes.                               The patient is on the cardiac monitor to evaluate for evidence of arrhythmia and/or significant heart rate changes.   Ddx:  Differential includes the following, with pertinent life- or limb-threatening emergencies considered:  Bilateral LE cellulitis, lymphangitis, CHF with venous stasis and acute edema, DVT, renal failure, liver failure  Patient's presentation is most consistent with acute presentation with potential threat to life or bodily function.  MDM:  66 yo M with h/o AFib, CHF, COPD, HTN, here  with severe BL leg swelling, redness, pain. Suspect CHF exacerbation/peripheral edema with superimposed cellulitis, possible lymphangitis. Exam shows significant warmth, tenderness. He does not appear septic or to have a necrotizing infection. CBC shows no leukocytosis or anemia. BNP slightly elevated. Renal function is at baseline. DVT study obtained and is negative. ESR elevated.   Will start on empiric ABX, diurese, and admit. Pt updated and in agreement.   MEDICATIONS GIVEN IN ED: Medications  aspirin EC tablet 325 mg (325 mg Oral Given 01/13/22 2350)  metoprolol tartrate (LOPRESSOR) tablet 25 mg (25 mg Oral Given 01/13/22 2349)  ipratropium-albuterol (DUONEB) 0.5-2.5 (3) MG/3ML nebulizer solution 3 mL (has no administration in time range)  acetaminophen (TYLENOL) tablet 650 mg (has no administration in time range)    Or  acetaminophen (TYLENOL) suppository 650 mg (has no administration in time range)  ondansetron (ZOFRAN) tablet 4 mg (has no administration in time range)    Or  ondansetron (ZOFRAN) injection 4 mg (has no administration in time range)  furosemide (LASIX) injection 40 mg (40 mg Intravenous Not Given 01/13/22 2232)  HYDROcodone-acetaminophen (NORCO/VICODIN) 5-325 MG per tablet 1-2 tablet (2 tablets Oral Given 01/13/22 2350)  cefTRIAXone (ROCEPHIN) 1 g in sodium chloride 0.9 % 100 mL IVPB (has no administration in time range)  enoxaparin (LOVENOX) injection 52.5 mg (52.5 mg Subcutaneous Given 01/13/22 2349)  vancomycin (VANCOCIN) IVPB 1000 mg/200 mL premix (has no administration in time range)  cefTRIAXone (ROCEPHIN) 2 g in sodium chloride 0.9 % 100 mL IVPB (0 g Intravenous Stopped 01/13/22 2037)  vancomycin (VANCOREADY) IVPB 2000 mg/400 mL (0 mg Intravenous Stopped 01/14/22 0047)  ondansetron (ZOFRAN) injection 4 mg (4 mg Intravenous Given 01/13/22 1936)  HYDROmorphone (DILAUDID) injection 0.5 mg (0.5 mg Intravenous Given 01/13/22 1938)  furosemide (LASIX) injection 40 mg (40 mg Intravenous  Given 01/13/22 2108)  metoprolol tartrate (LOPRESSOR) injection 2.5 mg (2.5 mg Intravenous Given 01/13/22 2108)     Consults:  Hospitalist consulted for admission.   EMR reviewed  PCP and Cardiology notes, most recently 12/2021     FINAL CLINICAL IMPRESSION(S) / ED DIAGNOSES   Final diagnoses:  Bilateral lower leg cellulitis  Peripheral edema     Rx / DC Orders   ED Discharge Orders     None        Note:  This document was prepared using Dragon voice recognition software and may include unintentional dictation errors.   Duffy Bruce, MD 01/14/22 [phone]

## 2022-01-13 NOTE — Assessment & Plan Note (Addendum)
Patient developed a rash with Eliquis.  Was seen by his cardiologist on May 2023 and was not interested in starting Xarelto.   --cont home ASA 325 mg daily --cont home Lopressor

## 2022-01-13 NOTE — Assessment & Plan Note (Addendum)
Multifactorial related to venous insufficiency, physical deconditioning related to CHF and COPD requiring oxygen, recent fall Treat acute conditions --PT eval, rec SNF rehab, pt still hasn't agreed to go

## 2022-01-13 NOTE — ED Provider Triage Note (Signed)
Emergency Medicine Provider Triage Evaluation Note  Erik [name], a 66 y.o. male  was evaluated in triage.  Pt complains of presents from home via EMS with complaints of leg swelling and weeping.  Patient ambulates with a walker at home with a history of CHF and reports a mechanical fall about 5 days earlier.  Denies any head injury or LOC.  He denies any interim fevers, chills, sweats.  Patient denies any chest pain or shortness of breath.  Review of Systems  Positive: BLE edema, weeping, LBP s/p fall Negative: FCS  Physical Exam  There were no vitals taken for this visit. Gen:   Awake, no distress  NAD Resp:  Normal effort rhonchi lower lobes MSK:   Moves extremities without difficulty BLE edema, serous drainage, erythema Other:    Medical Decision Making  Medically screening exam initiated at 3:27 PM.  Appropriate orders placed.  Erik [name] was informed that the remainder of the evaluation will be completed by another provider, this initial triage assessment does not replace that evaluation, and the importance of remaining in the ED until their evaluation is complete.  Patient with ED evaluation of BLE edema and weeping.    Lissa Hoard, PA-C 01/13/22 1529

## 2022-01-13 NOTE — Progress Notes (Signed)
PHARMACIST - PHYSICIAN COMMUNICATION  CONCERNING:  Enoxaparin (Lovenox) for DVT Prophylaxis    RECOMMENDATION: Patient was prescribed enoxaprin 40mg  q24 hours for VTE prophylaxis.   Filed Weights   01/13/22 1559  Weight: 106.6 kg (235 lb)    Body mass index is 33.72 kg/m.  Estimated Creatinine Clearance: 109.5 mL/min (by C-G formula based on SCr of 0.68 mg/dL).   Based on [facility] policy patient is candidate for enoxaparin 0.5mg /kg TBW SQ every 24 hours based on BMI being >30.  DESCRIPTION: Pharmacy has adjusted enoxaparin dose per [facility] policy.  Patient is now receiving enoxaparin 0.5 mg/kg every 24 hours    [facility], PharmD Clinical Pharmacist  01/13/2022 9:19 PM

## 2022-01-13 NOTE — Assessment & Plan Note (Addendum)
On 2-3L O2 at baseline.

## 2022-01-13 NOTE — Progress Notes (Signed)
PHARMACY -  BRIEF ANTIBIOTIC NOTE   Pharmacy has received consult(s) for vancomycin from an ED provider.  The patient's profile has been reviewed for ht/wt/allergies/indication/available labs.    One time order(s) placed for vancomycin 2000 mg IV x 1  Further antibiotics/pharmacy consults should be ordered by admitting physician if indicated.                       Thank you, Dallie Piles 01/13/2022  6:27 PM

## 2022-01-13 NOTE — Assessment & Plan Note (Addendum)
Ruled out acute exacerbation BNP modestly elevated at 185 and chest x-ray clear.  BLE swelling chronic.   --current Echo with LVEF 45-50%, slightly improved from prior 40-45% in 2022. Plan: --cont IV lasix for LE edema --cont Lopressor

## 2022-01-13 NOTE — Assessment & Plan Note (Addendum)
Not acutely exacerbated Continue home inhalers.

## 2022-01-13 NOTE — ED Notes (Addendum)
Pt provided a sandwich tray and water.  

## 2022-01-13 NOTE — Assessment & Plan Note (Signed)
Management as above °

## 2022-01-13 NOTE — ED Triage Notes (Signed)
Patient to ER via EMS with complaints of leg swelling and weeping. Reports mechanical fall five days ago and has had difficulty ambulating since. Denies LOC or hitting head.

## 2022-01-13 NOTE — Assessment & Plan Note (Addendum)
No complaints of chest pain --cont home ASA 325 mg daily --resume statin after discharge

## 2022-01-13 NOTE — H&P (Signed)
History and Physical    Patient: Erik [name] IZT:[id number] DOB: 12-15-1955 DOA: 01/13/2022 DOS: the patient was seen and examined on 01/13/2022 PCP: Sallyanne Kuster, NP  Patient coming from: Home  Chief Complaint:  Chief Complaint  Patient presents with   Leg Swelling    HPI: Erik [name] is a 66 y.o. male with medical history significant for CAD s/p PCI, HFrEF (last echo 40% to 45% 09/2020), permanent A-fib on aspirin {not on anticoagulation per preference), COPD on home O2, arthritis and chronic venous stasis, last seen by his cardiologist on 12/17/2020 with a complaint at the time of increased fluid retention and shortness of breath causing difficulty ambulating, at which time his torsemide was increased to twice daily for a few days, who presents to the ED with a similar complaint of worsening of his chronic leg swelling now associated with weeping and worsening ability to ambulate, resulting in a mechanical fall 5 days prior which in turn reduce his ability to get around even more.  At baseline he ambulates with a walker.  He denies hitting his head, loss of consciousness or any additional injury related to the fall. ED course and data review: BP 140/91 with otherwise normal vitals.  Troponin 7 and BNP 185.  CBC and BMP unremarkable hepatic function panel unremarkable.  Lactic acid normal at 1.4.  Sed rate slightly elevated at 22. Chest x-ray shows no active disease.  Bilateral lower extremity Doppler with no evidence of DVT. Patient was treated with a dose of IV Lasix, and started on antibiotics of Rocephin and vancomycin for possible cellulitis.  Hospitalist consulted for admission.   Review of Systems: As mentioned in the history of present illness. All other systems reviewed and are negative.  Past Medical History:  Diagnosis Date   Arthritis    Asthma    Atrial fibrillation (HCC)    CHF (congestive heart failure) (HCC)    COPD (chronic obstructive pulmonary disease) (HCC)     Coronary artery disease    GERD (gastroesophageal reflux disease)    Hypertension    Myocardial infarction [facility])    Shortness of breath dyspnea    Past Surgical History:  Procedure Laterality Date   ANGIOPLASTY  2009   CARDIAC CATHETERIZATION N/A 03/18/2015   Procedure: Left Heart Cath with Coronary Angiography;  Surgeon: Lamar Blinks, MD;  Location: [facility] INVASIVE CV LAB;  Service: Cardiovascular;  Laterality: N/A;   CHOLECYSTECTOMY  2005   CORONARY ANGIOGRAM  2009   Social History:  reports that he quit smoking about 24 years ago. He has never used smokeless tobacco. He reports that he does not drink alcohol and does not use drugs.  Allergies  Allergen Reactions   Benadryl [Diphenhydramine Hcl] Shortness Of Breath   Morphine And Related     Family History  Problem Relation Age of Onset   Coronary artery disease Father    Diabetes Father    Hyperlipidemia Father    Hypertension Father    Stroke Mother    Hyperlipidemia Mother     Prior to Admission medications   Medication Sig Start Date End Date Taking? Authorizing Provider  metoprolol tartrate (LOPRESSOR) 25 MG tablet TAKE TWO TABLETS BY MOUTH EVERY MORNING and TAKE TWO TABLETS BY MOUTH EVERY EVENING 12/28/21   Furth, Cadence H, PA-C  torsemide (DEMADEX) 10 MG tablet TAKE ONE TABLET BY MOUTH ONCE DAILY 12/28/21   Furth, Cadence H, PA-C  aspirin EC 325 MG tablet Take 325 mg by mouth daily.  [provider]  collagenase (SANTYL) ointment Apply 1 application with unna wrap changes Patient not taking: Reported on 12/17/2021 11/09/20   Georgiana Spinner, NP  Humidifier MISC Use with O2 at all times 08/25/20   Lyndon Code, MD  ipratropium-albuterol (DUONEB) 0.5-2.5 (3) MG/3ML SOLN USE 1 VIAL VIA NEBULIZER EVERY 6 HOURS 12/27/21   Sallyanne Kuster, NP  loratadine (CLARITIN) 10 MG tablet Take 1 tablet (10 mg total) by mouth daily. 11/05/20   Theotis Burrow, NP  losartan (COZAAR) 25 MG tablet Take 1 tablet (25 mg) by mouth  once daily Patient not taking: Reported on 12/17/2021    [provider]  OXYGEN Inhale 2-3 L into the lungs daily.    [provider]  rosuvastatin (CRESTOR) 10 MG tablet  12/01/20   [provider]  sulfamethoxazole-trimethoprim (BACTRIM DS) 800-160 MG tablet Take 2 tablets by mouth 2 (two) times daily. Patient not taking: Reported on 12/17/2021 11/09/21   Sallyanne Kuster, NP  TRELEGY ELLIPTA 100-62.5-25 MCG/INH AEPB Inhale 1 puff into the lungs daily. 04/14/21   Lyndon Code, MD    Physical Exam: Vitals:   01/13/22 1559  BP: (!) 140/91  Pulse: 97  Resp: 18  Temp: 98.1 F (36.7 C)  TempSrc: Oral  SpO2: 100%  Weight: 106.6 kg  Height: 5\' 10"  (1.778 m)   Physical Exam Vitals and nursing note reviewed.  Constitutional:      General: He is not in acute distress. HENT:     Head: Normocephalic and atraumatic.  Cardiovascular:     Rate and Rhythm: Normal rate and regular rhythm.     Heart sounds: Normal heart sounds.  Pulmonary:     Effort: Pulmonary effort is normal.     Breath sounds: Normal breath sounds.  Abdominal:     Palpations: Abdomen is soft.     Tenderness: There is no abdominal tenderness.  Musculoskeletal:     Right lower leg: Edema present.     Left lower leg: Edema present.     Comments: As shown below in pic  Skin:    Comments: Weeping ulcers anteriorly and on posterior left calf  Neurological:     Mental Status: Mental status is at baseline.        Labs on Admission: I have personally reviewed following labs and imaging studies  CBC: Recent Labs  Lab 01/13/22 1607  WBC 10.5  NEUTROABS 7.0  HGB 13.8  HCT 44.0  MCV 95.7  PLT 363   Basic Metabolic Panel: Recent Labs  Lab 01/13/22 1607  NA 137  K 4.7  CL 103  CO2 30  GLUCOSE 95  BUN 17  CREATININE 0.68  CALCIUM 8.4*   GFR: Estimated Creatinine Clearance: 109.5 mL/min (by C-G formula based on SCr of 0.68 mg/dL). Liver Function Tests: Recent Labs  Lab  01/13/22 1607  AST 29  ALT 17  ALKPHOS 64  BILITOT 1.1  PROT 6.2*  ALBUMIN 3.2*   No results for input(s): "LIPASE", "AMYLASE" in the last 168 hours. No results for input(s): "AMMONIA" in the last 168 hours. Coagulation Profile: No results for input(s): "INR", "PROTIME" in the last 168 hours. Cardiac Enzymes: No results for input(s): "CKTOTAL", "CKMB", "CKMBINDEX", "TROPONINI" in the last 168 hours. BNP (last 3 results) No results for input(s): "PROBNP" in the last 8760 hours. HbA1C: No results for input(s): "HGBA1C" in the last 72 hours. CBG: No results for input(s): "GLUCAP" in the last 168 hours. Lipid Profile: No results  for input(s): "CHOL", "HDL", "LDLCALC", "TRIG", "CHOLHDL", "LDLDIRECT" in the last 72 hours. Thyroid Function Tests: No results for input(s): "TSH", "T4TOTAL", "FREET4", "T3FREE", "THYROIDAB" in the last 72 hours. Anemia Panel: No results for input(s): "VITAMINB12", "FOLATE", "FERRITIN", "TIBC", "IRON", "RETICCTPCT" in the last 72 hours. Urine analysis:    Component Value Date/Time   COLORURINE YELLOW (A) 08/13/2017 2310   APPEARANCEUR Clear 02/19/2020 1035   LABSPEC 1.014 08/13/2017 2310   LABSPEC 1.013 07/18/2013 1730   PHURINE 5.0 08/13/2017 2310   GLUCOSEU Negative 02/19/2020 1035   GLUCOSEU Negative 07/18/2013 1730   HGBUR SMALL (A) 08/13/2017 2310   BILIRUBINUR Negative 02/19/2020 1035   BILIRUBINUR Negative 07/18/2013 1730   KETONESUR NEGATIVE 08/13/2017 2310   PROTEINUR Negative 02/19/2020 1035   PROTEINUR 30 (A) 08/13/2017 2310   NITRITE Negative 02/19/2020 1035   NITRITE NEGATIVE 08/13/2017 2310   LEUKOCYTESUR Negative 02/19/2020 1035   LEUKOCYTESUR Negative 07/18/2013 1730    Radiological Exams on Admission: US Venous Img Lower Bilateral  Result Date: 01/13/2022 CLINICAL DATA:  Bilateral leg pain and swelling, initial encounter EXAM: BILATERAL LOWER EXTREMITY VENOUS DOPPLER ULTRASOUND TECHNIQUE: Gray-scale sonography with graded  compression, as well as color Doppler and duplex ultrasound were performed to evaluate the lower extremity deep venous systems from the level of the common femoral vein and including the common femoral, femoral, profunda femoral, popliteal and calf veins including the posterior tibial, peroneal and gastrocnemius veins when visible. The superficial great saphenous vein was also interrogated. Spectral Doppler was utilized to evaluate flow at rest and with distal augmentation maneuvers in the common femoral, femoral and popliteal veins. COMPARISON:  None Available. FINDINGS: RIGHT LOWER EXTREMITY Common Femoral Vein: No evidence of thrombus. Normal compressibility, respiratory phasicity and response to augmentation. Saphenofemoral Junction: No evidence of thrombus. Normal compressibility and flow on color Doppler imaging. Profunda Femoral Vein: No evidence of thrombus. Normal compressibility and flow on color Doppler imaging. Femoral Vein: No evidence of thrombus. Normal compressibility, respiratory phasicity and response to augmentation. Popliteal Vein: No evidence of thrombus. Normal compressibility, respiratory phasicity and response to augmentation. Calf Veins: No evidence of thrombus. Normal compressibility and flow on color Doppler imaging. Superficial Great Saphenous Vein: No evidence of thrombus. Normal compressibility. Venous Reflux:  None. Other Findings:  None. LEFT LOWER EXTREMITY Common Femoral Vein: No evidence of thrombus. Normal compressibility, respiratory phasicity and response to augmentation. Saphenofemoral Junction: No evidence of thrombus. Normal compressibility and flow on color Doppler imaging. Profunda Femoral Vein: No evidence of thrombus. Normal compressibility and flow on color Doppler imaging. Femoral Vein: No evidence of thrombus. Normal compressibility, respiratory phasicity and response to augmentation. Popliteal Vein: No evidence of thrombus. Normal compressibility, respiratory  phasicity and response to augmentation. Calf Veins: No evidence of thrombus. Normal compressibility and flow on color Doppler imaging. Superficial Great Saphenous Vein: No evidence of thrombus. Normal compressibility. Venous Reflux:  None. Other Findings:  None. IMPRESSION: No evidence of deep venous thrombosis in either lower extremity. Electronically Signed   By: Alcide Clever M.D.   On: 01/13/2022 20:27   DG Chest Portable 1 View  Result Date: 01/13/2022 CLINICAL DATA:  SOB. Leg swelling and weeping. Reports mechanical fall five days ago. Hx of SOB, asthma, a-fib, CHF, COPD, CAD, HTN, MI, former smoker. EXAM: PORTABLE CHEST 1 VIEW COMPARISON:  Chest x-ray 12/11/2019, CT chest 09/09/2019 FINDINGS: The heart and mediastinal contours are unchanged. Atherosclerotic plaque. Biapical pleural/pulmonary scarring. Hyperinflation. No focal consolidation. Chronic portion show markings with no overt pulmonary edema. No  pleural effusion. No pneumothorax. No acute osseous abnormality. IMPRESSION: 1. No active disease. 2. Aortic Atherosclerosis (ICD10-I70.0) and Emphysema (ICD10-J43.9). Electronically Signed   By: Tish FredericksonMorgane  Naveau M.D.   On: 01/13/2022 18:33     Data Reviewed: Relevant notes from primary care and specialist visits, past discharge summaries as available in EHR, including Care Everywhere. Prior diagnostic testing as pertinent to current admission diagnoses Updated medications and problem lists for reconciliation ED course, including vitals, labs, imaging, treatment and response to treatment Triage notes, nursing and pharmacy notes and ED provider's notes Notable results as noted in HPI   Assessment and Plan: * Bilateral cellulitis of lower leg Rocephin and azithromycin Keep leg elevated Wound care consult Follow blood cultures  Ulcer of extremity due to chronic venous insufficiency (HCC) Management as above  Acute on chronic HFrEF (heart failure with reduced ejection fraction)  (HCC) Patient has increasing fluid overload to lower extremities and recently had his torsemide increased for a few days by his cardiologist BNP modestly elevated at 185 and chest x-ray clear We will give IV Lasix Continue home metoprolol and losartan Daily weights with intake and output monitoring Most recent echo from review of cardiology records appears to be 2022 so we will repeat Consider cardiology consult.  [facility]  [facility]  Ambulatory dysfunction Multifactorial related to venous insufficiency, physical deconditioning related to CHF and COPD requiring oxygen, recent fall Treat acute conditions PT eval and TOC consult  Chronic a-fib (HCC) Continue aspirin Patient developed a rash with Eliquis.  Was seen by his cardiologist on May 2023 and was not interested in starting Xarelto.  Note reviewed.  CAD (coronary artery disease) No complaints of chest pain Continue aspirin, rosuvastatin and metoprolol  Chronic respiratory failure with hypoxia (HCC) No increased requirement Continue supplemental oxygen and titrate as needed needed  COPD (chronic obstructive pulmonary disease) (HCC) Not acutely exacerbated Continue home inhalers.  DuoNebs as needed        DVT prophylaxis: Lovenox  Consults: none  Advance Care Planning:   Code Status: Prior   Family Communication: none  Disposition Plan: Back to previous home environment  Severity of Illness: The appropriate patient status for this patient is INPATIENT. Inpatient status is judged to be reasonable and necessary in order to provide the required intensity of service to ensure the patient's safety. The patient's presenting symptoms, physical exam findings, and initial radiographic and laboratory data in the context of their chronic comorbidities is felt to place them at high risk for further clinical deterioration. Furthermore, it is not anticipated that the patient will be medically  stable for discharge from the hospital within 2 midnights of admission.   * I certify that at the point of admission it is my clinical judgment that the patient will require inpatient hospital care spanning beyond 2 midnights from the point of admission due to high intensity of service, high risk for further deterioration and high frequency of surveillance required.*  Author: Andris BaumannHazel V Obi Scrima, MD 01/13/2022 9:07 PM  For on call review [email or web address].

## 2022-01-13 NOTE — Progress Notes (Signed)
Pharmacy Antibiotic Note  Erik [name] is a 66 y.o. male w/ PMH of CAD s/p PCI, HFrEF (last echo 40% to 45% 09/2020), permanent A-fib on aspirin {not on anticoagulation per preference), COPD on home O2, arthritis and chronic venous stasisadmitted on 01/13/2022 with cellulitis.  Pharmacy has been consulted for vancomycin dosing. There is an active order for 2000 mg IV vancomycin  Plan: start vancomycin 1000 mg IV Q 12 hrs Goal AUC 400-550 Expected AUC: 462.2 SCr used: 0.80 mg/dL (rounded up) Ke 0.081 h-1, T1/2: 8.4 h Daily serum creatinine while on IV vancomycin   Height: 5\' 10"  (177.8 cm) Weight: 106.6 kg (235 lb) IBW/kg (Calculated) : 73  Temp (24hrs), Avg:98.1 F (36.7 C), Min:98.1 F (36.7 C), Max:98.1 F (36.7 C)  Recent Labs  Lab 01/13/22 1607 01/13/22 1951  WBC 10.5  --   CREATININE 0.68  --   LATICACIDVEN 1.6 1.4    Estimated Creatinine Clearance: 109.5 mL/min (by C-G formula based on SCr of 0.68 mg/dL).    Allergies  Allergen Reactions   Benadryl [Diphenhydramine Hcl] Shortness Of Breath   Morphine And Related     Antimicrobials this admission: 06/08 vancomycin >>  06/08 ceftriaxone >>   Microbiology results: 06/08 BCx: pending  Thank you for allowing pharmacy to be a part of this patient's care.  Dallie Piles 01/13/2022 9:23 PM

## 2022-01-14 ENCOUNTER — Inpatient Hospital Stay: Admit: 2022-01-14 | Payer: Medicare Other

## 2022-01-14 ENCOUNTER — Inpatient Hospital Stay (HOSPITAL_COMMUNITY)
Admit: 2022-01-14 | Discharge: 2022-01-14 | Disposition: A | Discharge status: Home / Self Care | Payer: Medicare Other | Attending: Internal Medicine | Admitting: Internal Medicine

## 2022-01-14 DIAGNOSIS — I872 Venous insufficiency (chronic) (peripheral): Secondary | ICD-10-CM

## 2022-01-14 DIAGNOSIS — I5031 Acute diastolic (congestive) heart failure: Secondary | ICD-10-CM

## 2022-01-14 DIAGNOSIS — L03116 Cellulitis of left lower limb: Principal | ICD-10-CM

## 2022-01-14 DIAGNOSIS — L03115 Cellulitis of right lower limb: Secondary | ICD-10-CM | POA: Diagnosis not present

## 2022-01-14 DIAGNOSIS — L97909 Non-pressure chronic ulcer of unspecified part of unspecified lower leg with unspecified severity: Secondary | ICD-10-CM

## 2022-01-14 DIAGNOSIS — L98499 Non-pressure chronic ulcer of skin of other sites with unspecified severity: Secondary | ICD-10-CM

## 2022-01-14 LAB — ECHOCARDIOGRAM COMPLETE
AR max vel: 1.54 cm2
AV Area VTI: 1.41 cm2
AV Area mean vel: 1.55 cm2
AV Mean grad: 6 mmHg
AV Peak grad: 11.4 mmHg
Ao pk vel: 1.69 m/s
Area-P 1/2: 2.74 cm2
Calc EF: 66.9 %
Height: 70 in
MV VTI: 1.19 cm2
S' Lateral: 5.04 cm
Single Plane A2C EF: 59.5 %
Single Plane A4C EF: 71 %
Weight: 3887.15 oz

## 2022-01-14 LAB — CREATININE, SERUM
Creatinine, Ser: 0.84 mg/dL (ref 0.61–1.24)
GFR, Estimated: >60 mL/min (ref >60)

## 2022-01-14 LAB — HIV ANTIBODY (ROUTINE TESTING W REFLEX): HIV Screen 4th Generation wRfx: NONREACTIVE

## 2022-01-14 MED ORDER — IPRATROPIUM-ALBUTEROL 0.5-2.5 (3) MG/3ML IN SOLN: 3.0000 mL | Freq: Two times a day (BID) | RESPIRATORY_TRACT | Status: DC

## 2022-01-14 MED ORDER — FUROSEMIDE 10 MG/ML IJ SOLN
40.0000 mg | Freq: Two times a day (BID) | INTRAMUSCULAR | Status: DC
  Administered 2022-01-14 – 2022-01-18 (×9): 40 mg via INTRAVENOUS
  Filled 2022-01-14 (×9): qty 4

## 2022-01-14 MED ORDER — FLUTICASONE FUROATE-VILANTEROL 100-25 MCG/ACT IN AEPB
1.0000 | INHALATION_SPRAY | Freq: Every day | RESPIRATORY_TRACT | Status: DC
  Administered 2022-01-14 – 2022-01-25 (×11): 1 via RESPIRATORY_TRACT
  Filled 2022-01-14 (×2): qty 28

## 2022-01-14 MED ORDER — UMECLIDINIUM BROMIDE 62.5 MCG/ACT IN AEPB
1.0000 | INHALATION_SPRAY | Freq: Every day | RESPIRATORY_TRACT | Status: DC
  Administered 2022-01-14 – 2022-01-25 (×10): 1 via RESPIRATORY_TRACT
  Filled 2022-01-14 (×3): qty 7

## 2022-01-14 MED ORDER — SACCHAROMYCES BOULARDII 250 MG PO CAPS
250.0000 mg | ORAL_CAPSULE | Freq: Two times a day (BID) | ORAL | Status: DC
  Administered 2022-01-14 – 2022-01-25 (×21): 250 mg via ORAL
  Filled 2022-01-14 (×23): qty 1

## 2022-01-14 MED ORDER — PERFLUTREN LIPID MICROSPHERE
1.0000 mL | INTRAVENOUS | Status: AC | PRN
  Administered 2022-01-14: 3 mL via INTRAVENOUS

## 2022-01-14 MED ORDER — TRAMADOL HCL 50 MG PO TABS
50.0000 mg | ORAL_TABLET | Freq: Four times a day (QID) | ORAL | Status: DC | PRN
  Administered 2022-01-14 – 2022-01-20 (×13): 50 mg via ORAL
  Filled 2022-01-14 (×13): qty 1

## 2022-01-14 MED ORDER — DOCUSATE SODIUM 100 MG PO CAPS
100.0000 mg | ORAL_CAPSULE | Freq: Every day | ORAL | Status: DC
  Administered 2022-01-14 – 2022-01-25 (×12): 100 mg via ORAL
  Filled 2022-01-14 (×12): qty 1

## 2022-01-14 MED ORDER — ACETAMINOPHEN 500 MG PO TABS
1000.0000 mg | ORAL_TABLET | Freq: Three times a day (TID) | ORAL | Status: DC | PRN
  Administered 2022-01-14 – 2022-01-20 (×3): 1000 mg via ORAL
  Filled 2022-01-14 (×4): qty 2

## 2022-01-14 NOTE — TOC Initial Note (Signed)
Transition of Care [facility]) - Initial/Assessment Note    Patient Details  Name: Erik [name] MRN: [id number] Date of Birth: 1955-12-02  Transition of Care [facility]) CM/SW Contact:    Truddie Hidden, RN Phone Number: 01/14/2022, 3:32 PM  Clinical Narrative:                 Attempt to reach patient for choice of SNF as recommended by therapy. No answer. PASSAR obtained. FL2 completed.         Patient Goals and CMS Choice        Expected Discharge Plan and Services                                                Prior Living Arrangements/Services                       Activities of Daily Living      Permission Sought/Granted                  Emotional Assessment              Admission diagnosis:  Peripheral edema [R60.9] Bilateral lower leg cellulitis [L03.116, L03.115] Patient Active Problem List   Diagnosis Date Noted   Ulcer of extremity due to chronic venous insufficiency (HCC) 01/14/2022   Bilateral cellulitis of lower leg 01/13/2022   Ambulatory dysfunction 01/13/2022   Bilateral lower leg cellulitis 01/13/2022   Acute on chronic HFrEF (heart failure with reduced ejection fraction) (HCC) 01/13/2022   Chronic respiratory failure with hypoxia (HCC) 01/13/2022   Atherosclerosis of native arteries of extremity with intermittent claudication (HCC) 06/01/2020   Dorsalgia 09/11/2017   Occlusion and stenosis of bilateral carotid arteries 09/11/2017   Pulmonic heart disease (HCC) 09/11/2017   COPD (chronic obstructive pulmonary disease) (HCC) 08/14/2017   Chronic a-fib (HCC) 03/27/2015   CAD (coronary artery disease) 03/02/2015   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 [place] [address] Phone: [phone] Fax: [phone]  [facility] - [place], [place] - [address] [facility] [employer] [address] [place] [address] Phone: [phone] Fax: [phone]     Social Determinants of Health (SDOH) Interventions    Readmission Risk Interventions     No data to display

## 2022-01-14 NOTE — [place] FL2 (Signed)
[place] MEDICAID FL2 LEVEL OF CARE SCREENING TOOL     IDENTIFICATION  Patient Name: Erik [name] Birthdate: 30-Mar-1956 Sex: male Admission Date (Current Location): 01/13/2022  [facility] and [place] Number:  Chiropodist and Address:  [facility], [address], [place], [place] [address]      Provider Number: [id number]  Attending Physician Name and Address:  Darlin Priestly, MD  Relative Name and Phone Number:  Babette Relic [phone]    Current Level of Care: Hospital Recommended Level of Care: Skilled Nursing Facility Prior Approval Number:    Date Approved/Denied:   PASRR Number: [id number]  Discharge Plan: SNF    Current Diagnoses: Patient Active Problem List   Diagnosis Date Noted   Ulcer of extremity due to chronic venous insufficiency (HCC) 01/14/2022   Bilateral cellulitis of lower leg 01/13/2022   Ambulatory dysfunction 01/13/2022   Bilateral lower leg cellulitis 01/13/2022   Acute on chronic HFrEF (heart failure with reduced ejection fraction) (HCC) 01/13/2022   Chronic respiratory failure with hypoxia (HCC) 01/13/2022   Atherosclerosis of native arteries of extremity with intermittent claudication (HCC) 06/01/2020   Dorsalgia 09/11/2017   Occlusion and stenosis of bilateral carotid arteries 09/11/2017   Pulmonic heart disease (HCC) 09/11/2017   COPD (chronic obstructive pulmonary disease) (HCC) 08/14/2017   Chronic a-fib (HCC) 03/27/2015   CAD (coronary artery disease) 03/02/2015   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   Essential hypertension with goal blood pressure less than 130/80 02/23/2015    Orientation RESPIRATION BLADDER Height & Weight     Self, Time, Situation, Place  Normal External catheter Weight: 110.2 kg Height:  5\' 10"  (177.8 cm)  BEHAVIORAL SYMPTOMS/MOOD NEUROLOGICAL BOWEL NUTRITION STATUS      Continent Diet  AMBULATORY STATUS COMMUNICATION OF NEEDS Skin   Limited Assist Verbally Other (Comment)  (Cellulitis bilateral LE)                       Personal Care Assistance Level of Assistance  Bathing, Feeding, Dressing, Total care Bathing Assistance: Limited assistance Feeding assistance: Independent Dressing Assistance: Limited assistance Total Care Assistance: Limited assistance   Functional Limitations Info             SPECIAL CARE FACTORS FREQUENCY  PT (By licensed PT)     PT Frequency: 2x min weekly              Contractures Contractures Info: Not present    Additional Factors Info  Code Status, Allergies Code Status Info: FULL Allergies Info: Benadryl (Diphenhydramine Hcl), Morphine And Related           Current Medications (01/14/2022):  This is the current hospital active medication list Current Facility-Administered Medications  Medication Dose Route Frequency Provider Last Rate Last Admin   acetaminophen (TYLENOL) tablet 1,000 mg  1,000 mg Oral TID PRN 03/16/2022, MD       aspirin EC tablet 325 mg  325 mg Oral Daily Darlin Priestly V, MD   325 mg at 01/14/22 0820   cefTRIAXone (ROCEPHIN) 1 g in sodium chloride 0.9 % 100 mL IVPB  1 g Intravenous Q24H 03/16/22 V, MD       docusate sodium (COLACE) capsule 100 mg  100 mg Oral Daily Lindajo Royal, MD   100 mg at 01/14/22 1148   enoxaparin (LOVENOX) injection 52.5 mg  0.5 mg/kg Subcutaneous Q24H 03/16/22, RPH   52.5 mg at 01/13/22 2349   fluticasone furoate-vilanterol (BREO ELLIPTA) 100-25 MCG/ACT  1 puff  1 puff Inhalation Daily Darlin Priestly, MD   1 puff at 01/14/22 1445   furosemide (LASIX) injection 40 mg  40 mg Intravenous BID Darlin Priestly, MD   40 mg at 01/14/22 1445   ipratropium-albuterol (DUONEB) 0.5-2.5 (3) MG/3ML nebulizer solution 3 mL  3 mL Nebulization Q4H PRN Andris Baumann, MD   3 mL at 01/14/22 1117   metoprolol tartrate (LOPRESSOR) tablet 25 mg  25 mg Oral BID Andris Baumann, MD   25 mg at 01/14/22 0820   ondansetron (ZOFRAN) tablet 4 mg  4 mg Oral Q6H PRN Andris Baumann, MD       Or    ondansetron [facility]) injection 4 mg  4 mg Intravenous Q6H PRN Andris Baumann, MD       saccharomyces boulardii (FLORASTOR) capsule 250 mg  250 mg Oral BID Darlin Priestly, MD   250 mg at 01/14/22 1148   traMADol (ULTRAM) tablet 50 mg  50 mg Oral Q6H PRN Darlin Priestly, MD       umeclidinium bromide (INCRUSE ELLIPTA) 62.5 MCG/ACT 1 puff  1 puff Inhalation Daily Darlin Priestly, MD   1 puff at 01/14/22 1446   Facility-Administered Medications Ordered in Other Encounters  Medication Dose Route Frequency Provider Last Rate Last Admin   perflutren lipid microspheres (DEFINITY) IV suspension  1-10 mL Intravenous PRN Andris Baumann, MD   3 mL at 01/14/22 1353     Discharge Medications: Please see discharge summary for a list of discharge medications.  Relevant Imaging Results:  Relevant Lab Results:   Additional Information SS# [id number]  Truddie Hidden, RN

## 2022-01-14 NOTE — Progress Notes (Signed)
  Progress Note   Patient: Erik [name] [id number]:[id number] DOB: 11/23/1955 DOA: 01/13/2022     1 DOS: the patient was seen and examined on 01/14/2022   Brief hospital course: No notes on file  Assessment and Plan: * Bilateral cellulitis of lower leg --started on vanc and ceftriaxone Plan: --cont ceftriaxone, d/c vanc since no obvious purulence --wound care consult  Ulcer of extremity due to chronic venous insufficiency (HCC) --tense edema Plan: --cont IV lasix 40 BID  Chronic systolic CHF (congestive heart failure) (HCC) Ruled out acute exacerbation BNP modestly elevated at 185 and chest x-ray clear.  BLE swelling chronic.   --current Echo with LVEF 45-50%, slightly improved from prior 40-45% in 2022. Plan: --cont IV lasix for LE edema --cont Lopressor  Ambulatory dysfunction Multifactorial related to venous insufficiency, physical deconditioning related to CHF and COPD requiring oxygen, recent fall Treat acute conditions --PT eval, rec SNF rehab  Chronic a-fib [facility]) Patient developed a rash with Eliquis.  Was seen by his cardiologist on May 2023 and was not interested in starting Xarelto.   --cont home ASA 325 mg daily --cont home Lopressor  CAD (coronary artery disease) No complaints of chest pain --cont home ASA 325 mg daily --resume statin after discharge  Chronic respiratory failure with hypoxia (HCC) On 2-3L O2 at baseline.  COPD (chronic obstructive pulmonary disease) (HCC) Not acutely exacerbated Continue home inhalers.          Subjective:  Pt reported swelling improved, had good urine output.  Complained of back pain which is chronic.   Physical Exam:  Constitutional: NAD, AAOx3 HEENT: conjunctivae and lids normal, EOMI CV: No cyanosis.   RESP: normal respiratory effort, on 4L Extremities: tense edema in BLE with erythema.  LLE wrapped. SKIN: warm, dry Neuro: II - XII grossly intact.   Psych: Normal mood and affect.  Appropriate judgement and  reason   Data Reviewed:  Family Communication:   Disposition: Status is: Inpatient   Planned Discharge Destination: Rehab    Time spent: 50 minutes  Author: Darlin Priestly, MD 01/14/2022 5:00 PM  For on call review [email or web address].

## 2022-01-14 NOTE — Evaluation (Signed)
Physical Therapy Evaluation Patient Details Name: Erik [name] MRN: [id number] DOB: Jan 22, 1956 Today's Date: 01/14/2022  History of Present Illness  Erik [name] is a 66 y.o. male  with h/o HFrEF, COPD, HTN, CAD s/p MI, here with leg pain. Reports over the past week, he's had increasing bilateral, R>L swelling. R leg has gotten more painful and red, swollen as well. He has also had worsening cough, orthopnea, sputum production. Has had difficulty getting around due to his leg swelling and pain.   Clinical Impression  Pt admitted with above diagnosis. Pt received upright in bed agreeable to PT services. Pt on 3L/min with Spo2 > 90% at rest and with mobility. Prior to hospitalization, pt reports being mod-I with RW in household typically having to asc/desc 9 stairs to enter/exit home. Reports spouse assists PRN with ADL's/IADL's such as bathing and dressing. Pt reports she can not help a lot physically due to her own health problems.   To date, pt relying on HOB maximally elevated along with bed rails and modA at LE's and torso so sit EOB. Pt reports history of chronic LBP and LLE pain which is limiting pt is mobility without significant physical assist requiring frequent rest breaks  throughout transfer. With bed elevated, pt able to stand with minguard but requiring mod VC's for hand placement and bouts of momentum to stand. In standing, pt unsteady with variable hand placements on differing parts of RW in kyphotic state also reaching for counter top despite max VC's to improve safety with AD. Pt initiating step transfer to recliner relying on minguard and minA at [facility] for safe sequencing with slow descent to recliner due to pain in LLE. Due to tremulous LE's and unsteadiness/poor safety awareness in standing, deferring gait attempts at this time. Pt unable to tolerate LE's elevated in order to eat breakfast despite PT education on importance of elevating LE's for edema management. Pt verbalizing  understanding stating he will elevate LE's after eating. All needs in reach, RN notified call bell not working in patient's room. Pt aware as well. Pt currently with functional limitations due to the deficits listed below (see PT Problem List). Pt will benefit from skilled PT to increase their independence and safety with mobility to allow discharge to the venue listed below.     Recommendations for follow up therapy are one component of a multi-disciplinary discharge planning process, led by the attending physician.  Recommendations may be updated based on patient status, additional functional criteria and insurance authorization.  Follow Up Recommendations Skilled nursing-short term rehab (<3 hours/day)    Assistance Recommended at Discharge Intermittent Supervision/Assistance  Patient can return home with the following  A lot of help with walking and/or transfers;A lot of help with bathing/dressing/bathroom;Assist for transportation;Help with stairs or ramp for entrance    Equipment Recommendations Other (comment) (tbd by next venue of care)  Recommendations for Other Services       Functional Status Assessment Patient has had a recent decline in their functional status and demonstrates the ability to make significant improvements in function in a reasonable and predictable amount of time.     Precautions / Restrictions Precautions Precautions: Fall Restrictions Weight Bearing Restrictions: No      Mobility  Bed Mobility Overal bed mobility: Needs Assistance Bed Mobility: Supine to Sit     Supine to sit: HOB elevated, Mod assist     General bed mobility comments: modA for sequencing LE's towards EOB and at torso to attain sitting Patient  Response: Cooperative  Transfers Overall transfer level: Needs assistance Equipment used: Rolling walker (2 wheels) Transfers: Sit to/from Stand, Bed to chair/wheelchair/BSC Sit to Stand: Min guard, From elevated surface   Step pivot  transfers: Min guard       General transfer comment: Reliant on bed elevated and momentum to stand. Max VC's for safe RW sequencing and hand placement    Ambulation/Gait                  Stairs            Wheelchair Mobility    Modified Rankin (Stroke Patients Only)       Balance Overall balance assessment: Needs assistance Sitting-balance support: Bilateral upper extremity supported, Feet supported Sitting balance-Leahy Scale: Fair     Standing balance support: During functional activity, Reliant on assistive device for balance Standing balance-Leahy Scale: Poor Standing balance comment: Unsafe use of RW in static standing. Back hunched over with UE's requiring support to maintain upright                             Pertinent Vitals/Pain Pain Assessment Pain Assessment: Faces Faces Pain Scale: Hurts whole lot Pain Location: L hip and low back Pain Descriptors / Indicators: Grimacing, Guarding, Dull, Sharp Pain Intervention(s): Limited activity within patient's tolerance, Monitored during session, Repositioned    Home Living Family/patient expects to be discharged to:: Private residence Living Arrangements: Spouse/significant other Available Help at Discharge: Family;Available 24 hours/day Type of Home: House Home Access: Stairs to enter Entrance Stairs-Rails: Can reach both;Right;Left Entrance Stairs-Number of Steps: 9   Home Layout: One level Home Equipment: Agricultural consultant (2 wheels);BSC/3in1 Additional Comments: home oxygen    Prior Function Prior Level of Function : Needs assist       Physical Assist : Mobility (physical);ADLs (physical) Mobility (physical): Bed mobility;Transfers;Gait   Mobility Comments: Reliant on RW in household ADLs Comments: Wife assists in bathing and dressing PRN. Drives pt to appts     Hand Dominance        Extremity/Trunk Assessment   Upper Extremity Assessment Upper Extremity Assessment:  Overall WFL for tasks assessed    Lower Extremity Assessment Lower Extremity Assessment: Generalized weakness;LLE deficits/detail LLE Deficits / Details: significant pain in L hip limiting mobility    Cervical / Trunk Assessment Cervical / Trunk Assessment: Normal  Communication   Communication: No difficulties  Cognition Arousal/Alertness: Awake/alert Behavior During Therapy: WFL for tasks assessed/performed Overall Cognitive Status: Within Functional Limits for tasks assessed                                          General Comments      Exercises Other Exercises Other Exercises: Role of PT in acute setting, d/c recs, safe use of DME for transfers.   Assessment/Plan    PT Assessment Patient needs continued PT services  PT Problem List Decreased strength;Decreased mobility;Decreased safety awareness;Decreased activity tolerance;Decreased balance;Decreased knowledge of use of DME;Pain       PT Treatment Interventions DME instruction;Therapeutic exercise;Gait training;Balance training;Stair training;Neuromuscular re-education;Functional mobility training;Therapeutic activities;Patient/family education    PT Goals (Current goals can be found in the Care Plan section)  Acute Rehab PT Goals Patient Stated Goal: to improve pain and walking PT Goal Formulation: With patient Time For Goal Achievement: 01/28/22 Potential to Achieve Goals: Fair  Frequency Min 2X/week     Co-evaluation               AM-PAC PT "6 Clicks" Mobility  Outcome Measure Help needed turning from your back to your side while in a flat bed without using bedrails?: A Lot Help needed moving from lying on your back to sitting on the side of a flat bed without using bedrails?: A Lot Help needed moving to and from a bed to a chair (including a wheelchair)?: A Lot Help needed standing up from a chair using your arms (e.g., wheelchair or bedside chair)?: A Little Help needed to walk  in hospital room?: A Lot Help needed climbing 3-5 steps with a railing? : Total 6 Click Score: 12    End of Session Equipment Utilized During Treatment: Gait belt;Oxygen Activity Tolerance: Patient limited by pain Patient left: in chair;with call bell/phone within reach;with chair alarm set Nurse Communication: Mobility status PT Visit Diagnosis: Unsteadiness on feet (R26.81);Other abnormalities of gait and mobility (R26.89);Muscle weakness (generalized) (M62.81);History of falling (Z91.81)    Time: [id number] PT Time Calculation (min) (ACUTE ONLY): 29 min   Charges:   PT Evaluation $PT Eval Moderate Complexity: 1 Mod PT Treatments $Therapeutic Activity: 8-22 mins        Erik [name] IV, PT, DPT Physical Therapist- [place]  [facility]  01/14/2022, 11:30 AM

## 2022-01-14 NOTE — Assessment & Plan Note (Addendum)
--  tense edema, with weeping ulcers.  Improved with IV diuresis. Plan: --cont IV lasix 40 BID --dressing change per order

## 2022-01-14 NOTE — TOC Progression Note (Signed)
Transition of Care [facility]) - Progression Note    Patient Details  Name: Erik [name] MRN: [id number] Date of Birth: [birth year]  [facility]) CM/SW Contact  Truddie Hidden, RN Phone Number: 01/14/2022, 4:14 PM  Clinical Narrative:    Spoke with patient about SNF recommendation. Patient not agreeable to SNF. Stated he did not feel it was necessary. Patient prefers to go home.         Expected Discharge Plan and Services                                                 Social Determinants of Health (SDOH) Interventions    Readmission Risk Interventions     No data to display

## 2022-01-14 NOTE — Progress Notes (Signed)
*  PRELIMINARY RESULTS* Echocardiogram 2D Echocardiogram has been performed.  Erik [name] 01/14/2022, 2:11 PM

## 2022-01-14 NOTE — Consult Note (Signed)
WOC Nurse Consult Note: Patient receiving care in [facility] [facility] [address]. Consult completed remotely after review of record and images. Reason for Consult: BLE venous stasis Wound type: venous stasis Pressure Injury POA: Yes/No/NA Measurement: Wound bed: see photos of BLE Drainage (amount, consistency, odor) weeping is occurring.  Periwound: edematous, erythematous Dressing procedure/placement/frequency: Wash BLEs with soap and water. Pat dry. Apply Sween Moisturizing Ointment to intact skin (pink and white tube in clean utility). Place Xeroform gauzes [place] [phone]) over any open or weeping areas. Beginning behind the toes and going to just below the knees, spiral wrap kerlix, then 4 inch ace wrap. Perform daily.   [place] of BLE yesterday found no DVTs in either leg.  No ABI results on record since 06/18/20.  WOC nurse will not follow at this time.  Please re-consult the WOC team if needed.  Helmut Muster, RN, MSN, CWOCN, CNS-BC, pager [phone]

## 2022-01-15 DIAGNOSIS — L03115 Cellulitis of right lower limb: Secondary | ICD-10-CM | POA: Diagnosis not present

## 2022-01-15 DIAGNOSIS — L03116 Cellulitis of left lower limb: Secondary | ICD-10-CM | POA: Diagnosis not present

## 2022-01-15 LAB — CBC
HCT: 42.5 % (ref 39.0–52.0)
Hemoglobin: 13.3 g/dL (ref 13.0–17.0)
MCH: 29.7 pg (ref 26.0–34.0)
MCHC: 31.3 g/dL (ref 30.0–36.0)
MCV: 94.9 fL (ref 80.0–100.0)
Platelets: 358 10*3/uL (ref 150–400)
RBC: 4.48 MIL/uL (ref 4.22–5.81)
RDW: 12.7 % (ref 11.5–15.5)
WBC: 8.6 10*3/uL (ref 4.0–10.5)
nRBC: 0 % (ref 0.0–0.2)

## 2022-01-15 LAB — BASIC METABOLIC PANEL
Anion gap: 7 (ref 5–15)
BUN: 24 mg/dL — ABNORMAL HIGH (ref 8–23)
CO2: 34 mmol/L — ABNORMAL HIGH (ref 22–32)
Calcium: 9.1 mg/dL (ref 8.9–10.3)
Chloride: 95 mmol/L — ABNORMAL LOW (ref 98–111)
Creatinine, Ser: 0.71 mg/dL (ref 0.61–1.24)
GFR, Estimated: >60 mL/min (ref >60)
Glucose, Bld: 94 mg/dL (ref 70–99)
Potassium: 4.1 mmol/L (ref 3.5–5.1)
Sodium: 136 mmol/L (ref 135–145)

## 2022-01-15 LAB — MAGNESIUM: Magnesium: 2.1 mg/dL (ref 1.7–2.4)

## 2022-01-15 NOTE — Progress Notes (Signed)
  Progress Note   Patient: Erik [name] [id number] DOB: [birth year] DOA: 01/13/2022     2 DOS: the patient was seen and examined on 01/15/2022   Brief hospital course: No notes on file  Assessment and Plan: * Bilateral cellulitis of lower leg --started on vanc and ceftriaxone, d/c'ed vanc Plan: --cont ceftriaxone for now  Ulcer of extremity due to chronic venous insufficiency (HCC) --tense edema, with weeping ulcers Plan: --cont IV lasix 40 BID --dressing change per order  Chronic systolic CHF (congestive heart failure) (HCC) Ruled out acute exacerbation BNP modestly elevated at 185 and chest x-ray clear.  BLE swelling chronic.   --current Echo with LVEF 45-50%, slightly improved from prior 40-45% in 2022. Plan: --cont IV lasix for LE edema --cont Lopressor  Ambulatory dysfunction Multifactorial related to venous insufficiency, physical deconditioning related to CHF and COPD requiring oxygen, recent fall Treat acute conditions --PT eval, rec SNF rehab  Chronic a-fib [facility]) Patient developed a rash with Eliquis.  Was seen by his cardiologist on May 2023 and was not interested in starting Xarelto.   --cont home ASA 325 mg daily --cont home Lopressor  CAD (coronary artery disease) No complaints of chest pain --cont home ASA 325 mg daily --resume statin after discharge  Chronic respiratory failure with hypoxia (HCC) On 2-3L O2 at baseline.  COPD (chronic obstructive pulmonary disease) (HCC) Not acutely exacerbated Continue home inhalers.          Subjective:  Pt complained of pain and weeping from ulcers in the back of his legs.   Physical Exam:  Constitutional: NAD, AAOx3, sitting in recliner HEENT: conjunctivae and lids normal, EOMI CV: No cyanosis.   RESP: normal respiratory effort Extremities: tense edema in BLE SKIN: warm, patches of ulcers behind both lower legs, with weeping Neuro: II - XII grossly intact.   Psych: Normal mood and affect.   Appropriate judgement and reason      Data Reviewed:  Family Communication:   Disposition: Status is: Inpatient   Planned Discharge Destination: Rehab    Time spent: 50 minutes  Author: Darlin Priestly, MD 01/15/2022 3:00 PM  For on call review [email or web address].

## 2022-01-16 DIAGNOSIS — L03115 Cellulitis of right lower limb: Secondary | ICD-10-CM | POA: Diagnosis not present

## 2022-01-16 DIAGNOSIS — L03116 Cellulitis of left lower limb: Secondary | ICD-10-CM | POA: Diagnosis not present

## 2022-01-16 LAB — CBC
HCT: 39.1 % (ref 39.0–52.0)
Hemoglobin: 12.7 g/dL — ABNORMAL LOW (ref 13.0–17.0)
MCH: 30.1 pg (ref 26.0–34.0)
MCHC: 32.5 g/dL (ref 30.0–36.0)
MCV: 92.7 fL (ref 80.0–100.0)
Platelets: 358 10*3/uL (ref 150–400)
RBC: 4.22 MIL/uL (ref 4.22–5.81)
RDW: 12.6 % (ref 11.5–15.5)
WBC: 10.7 10*3/uL — ABNORMAL HIGH (ref 4.0–10.5)
nRBC: 0 % (ref 0.0–0.2)

## 2022-01-16 LAB — MAGNESIUM: Magnesium: 2.1 mg/dL (ref 1.7–2.4)

## 2022-01-16 LAB — BASIC METABOLIC PANEL
Anion gap: 6 (ref 5–15)
BUN: 19 mg/dL (ref 8–23)
CO2: 33 mmol/L — ABNORMAL HIGH (ref 22–32)
Calcium: 8.8 mg/dL — ABNORMAL LOW (ref 8.9–10.3)
Chloride: 98 mmol/L (ref 98–111)
Creatinine, Ser: 0.7 mg/dL (ref 0.61–1.24)
GFR, Estimated: >60 mL/min (ref >60)
Glucose, Bld: 123 mg/dL — ABNORMAL HIGH (ref 70–99)
Potassium: 3.9 mmol/L (ref 3.5–5.1)
Sodium: 137 mmol/L (ref 135–145)

## 2022-01-16 MED ORDER — GABAPENTIN 300 MG PO CAPS
300.0000 mg | ORAL_CAPSULE | Freq: Three times a day (TID) | ORAL | Status: DC
  Administered 2022-01-16 – 2022-01-25 (×26): 300 mg via ORAL
  Filled 2022-01-16 (×26): qty 1

## 2022-01-16 MED ORDER — FENTANYL CITRATE PF 50 MCG/ML IJ SOSY
12.5000 ug | PREFILLED_SYRINGE | Freq: Once | INTRAMUSCULAR | Status: AC
  Administered 2022-01-16: 12.5 ug via INTRAVENOUS
  Filled 2022-01-16: qty 1

## 2022-01-16 NOTE — Progress Notes (Signed)
  Progress Note   Patient: Erik [name] [id number] DOB: 1956/05/01 DOA: 01/13/2022     3 DOS: the patient was seen and examined on 01/16/2022   Brief hospital course: No notes on file  Assessment and Plan: * Bilateral cellulitis of lower leg --started on vanc and ceftriaxone, d/c'ed vanc Plan: --cont ceftriaxone for now  Ulcer of extremity due to chronic venous insufficiency (HCC) --tense edema, with weeping ulcers Plan: --cont IV lasix 40 BID --dressing change per order  Chronic systolic CHF (congestive heart failure) (HCC) Ruled out acute exacerbation BNP modestly elevated at 185 and chest x-ray clear.  BLE swelling chronic.   --current Echo with LVEF 45-50%, slightly improved from prior 40-45% in 2022. Plan: --cont IV lasix for LE edema --cont Lopressor  Ambulatory dysfunction Multifactorial related to venous insufficiency, physical deconditioning related to CHF and COPD requiring oxygen, recent fall Treat acute conditions --PT eval, rec SNF rehab, p declined  Chronic a-fib (HCC) Patient developed a rash with Eliquis.  Was seen by his cardiologist on May 2023 and was not interested in starting Xarelto.   --cont home ASA 325 mg daily --cont home Lopressor  CAD (coronary artery disease) No complaints of chest pain --cont home ASA 325 mg daily --resume statin after discharge  Chronic respiratory failure with hypoxia (HCC) On 2-3L O2 at baseline.  COPD (chronic obstructive pulmonary disease) (HCC) Not acutely exacerbated Continue home inhalers.          Subjective:  Improved urine output for the past day.     Physical Exam:  Constitutional: NAD, AAOx3 HEENT: conjunctivae and lids normal, EOMI CV: No cyanosis.   RESP: normal respiratory effort Extremities: both legs wrapped SKIN: warm, dry Neuro: II - XII grossly intact.   Psych: Normal mood and affect.     Data Reviewed:  Family Communication:   Disposition: Status is: Inpatient    Planned Discharge Destination: Home    Time spent: 35 minutes  Author: Darlin Priestly, MD 01/16/2022 1:34 PM  For on call review [email or web address].

## 2022-01-16 NOTE — Progress Notes (Signed)
Physical Therapy Treatment Patient Details Name: Erik [name] MRN: [id number] DOB: 1956-03-08 Today's Date: 01/16/2022   History of Present Illness Erik [name] is a 66 y.o. male  with h/o HFrEF, COPD, HTN, CAD s/p MI, here with leg pain. Reports over the past week, he's had increasing bilateral, R>L swelling. R leg has gotten more painful and red, swollen as well. He has also had worsening cough, orthopnea, sputum production. Has had difficulty getting around due to his leg swelling and pain.   PT Comments    Patient agreeable to PT. He continues to require assistance with all functional mobility. He has pain in left thigh/outer leg with any active movement and repositioning in the bed that limits independence with activity. Max A required for bed mobility. He was unable to stand fully with therapist assistance due to pain, generalized weakness, and fatigue with activity. Recommend to continue PT to maximize independence and facilitate return to prior level of function. He is not at his baseline level of functional mobility. SNF is still recommended at discharge.    Recommendations for follow up therapy are one component of a multi-disciplinary discharge planning process, led by the attending physician.  Recommendations may be updated based on patient status, additional functional criteria and insurance authorization.  Follow Up Recommendations  Skilled nursing-short term rehab (<3 hours/day)     Assistance Recommended at Discharge Intermittent Supervision/Assistance  Patient can return home with the following A lot of help with walking and/or transfers;A lot of help with bathing/dressing/bathroom;Assist for transportation;Help with stairs or ramp for entrance   Equipment Recommendations   (to be determined at next level of care)    Recommendations for Other Services       Precautions / Restrictions Precautions Precautions: Fall Restrictions Weight Bearing Restrictions: No      Mobility  Bed Mobility Overal bed mobility: Needs Assistance Bed Mobility: Supine to Sit, Sit to Supine     Supine to sit: Max assist, HOB elevated Sit to supine: Max assist   General bed mobility comments: assistance needed for BLE support. patient has increased left leg pain that limits independence with bed mobility with any movement of LLE. verbal cues for technique with increased time and effort required    Transfers Overall transfer level: Needs assistance                 General transfer comment: patient was unable to stand with assistance from therapist and bed height elevated. he is limited by pain and generalized weakness. 2 bouts of standing attempted. patient was able to incrementally scoot x 5 bouts to the left towards head of bed with cues for technique. rest breaks required between bouts of activity.    Ambulation/Gait                   Stairs             Wheelchair Mobility    Modified Rankin (Stroke Patients Only)       Balance Overall balance assessment: Needs assistance Sitting-balance support: Feet supported Sitting balance-Leahy Scale: Fair Sitting balance - Comments: no loss of balance in sitting, however patient does tend to guard and tense up at times due to left thigh pain.                                    Cognition Arousal/Alertness: Awake/alert Behavior During Therapy: WFL for tasks assessed/performed  Overall Cognitive Status: Within Functional Limits for tasks assessed                                          Exercises      General Comments General comments (skin integrity, edema, etc.): patient was fatigued with activity. Sp02 94% at rest and 89% after mobilizing (quickly increased to 90% with rest breaks and cues for breathing techniques)      Pertinent Vitals/Pain Pain Assessment Pain Assessment: Faces Pain Location: left thigh/outer leg, lower back Pain Descriptors /  Indicators: Discomfort, Grimacing, Guarding, Moaning Pain Intervention(s): Limited activity within patient's tolerance, Monitored during session, Repositioned    Home Living                          Prior Function            PT Goals (current goals can now be found in the care plan section) Acute Rehab PT Goals Patient Stated Goal: decreased pain and to go home PT Goal Formulation: With patient Time For Goal Achievement: 01/28/22 Potential to Achieve Goals: Fair Progress towards PT goals: Progressing toward goals    Frequency    Min 2X/week      PT Plan Current plan remains appropriate    Co-evaluation              AM-PAC PT "6 Clicks" Mobility   Outcome Measure  Help needed turning from your back to your side while in a flat bed without using bedrails?: A Lot Help needed moving from lying on your back to sitting on the side of a flat bed without using bedrails?: A Lot Help needed moving to and from a bed to a chair (including a wheelchair)?: A Lot Help needed standing up from a chair using your arms (e.g., wheelchair or bedside chair)?: Total Help needed to walk in hospital room?: Total Help needed climbing 3-5 steps with a railing? : Total 6 Click Score: 9    End of Session Equipment Utilized During Treatment: Oxygen Activity Tolerance: Patient limited by pain Patient left: in bed;with call bell/phone within reach;with bed alarm set Nurse Communication: Mobility status PT Visit Diagnosis: Unsteadiness on feet (R26.81);Other abnormalities of gait and mobility (R26.89);Muscle weakness (generalized) (M62.81);History of falling (Z91.81)     Time: 0865-7846 PT Time Calculation (min) (ACUTE ONLY): 38 min  Charges:  $Therapeutic Activity: 38-52 mins                     Donna Bernard, PT, MPT    Ina Homes 01/16/2022, 3:50 PM

## 2022-01-17 ENCOUNTER — Inpatient Hospital Stay: Payer: Medicare Other

## 2022-01-17 DIAGNOSIS — L03115 Cellulitis of right lower limb: Secondary | ICD-10-CM | POA: Diagnosis not present

## 2022-01-17 DIAGNOSIS — M25552 Pain in left hip: Secondary | ICD-10-CM

## 2022-01-17 DIAGNOSIS — L03116 Cellulitis of left lower limb: Secondary | ICD-10-CM | POA: Diagnosis not present

## 2022-01-17 LAB — CBC
HCT: 42.9 % (ref 39.0–52.0)
Hemoglobin: 13.7 g/dL (ref 13.0–17.0)
MCH: 30.2 pg (ref 26.0–34.0)
MCHC: 31.9 g/dL (ref 30.0–36.0)
MCV: 94.5 fL (ref 80.0–100.0)
Platelets: 399 10*3/uL (ref 150–400)
RBC: 4.54 MIL/uL (ref 4.22–5.81)
RDW: 12.7 % (ref 11.5–15.5)
WBC: 13.7 10*3/uL — ABNORMAL HIGH (ref 4.0–10.5)
nRBC: 0 % (ref 0.0–0.2)

## 2022-01-17 LAB — BASIC METABOLIC PANEL
Anion gap: 7 (ref 5–15)
BUN: 14 mg/dL (ref 8–23)
CO2: 34 mmol/L — ABNORMAL HIGH (ref 22–32)
Calcium: 8.7 mg/dL — ABNORMAL LOW (ref 8.9–10.3)
Chloride: 96 mmol/L — ABNORMAL LOW (ref 98–111)
Creatinine, Ser: 0.82 mg/dL (ref 0.61–1.24)
GFR, Estimated: >60 mL/min (ref >60)
Glucose, Bld: 107 mg/dL — ABNORMAL HIGH (ref 70–99)
Potassium: 4 mmol/L (ref 3.5–5.1)
Sodium: 137 mmol/L (ref 135–145)

## 2022-01-17 LAB — MAGNESIUM: Magnesium: 2.1 mg/dL (ref 1.7–2.4)

## 2022-01-17 MED ORDER — AMOXICILLIN-POT CLAVULANATE 875-125 MG PO TABS
1.0000 | ORAL_TABLET | Freq: Two times a day (BID) | ORAL | Status: DC
  Administered 2022-01-17 – 2022-01-18 (×4): 1 via ORAL
  Filled 2022-01-17 (×4): qty 1

## 2022-01-17 MED ORDER — ALUM & MAG HYDROXIDE-SIMETH 200-200-20 MG/5ML PO SUSP: 30.0000 mL | Freq: Four times a day (QID) | ORAL | Status: DC | PRN

## 2022-01-17 MED ORDER — POLYETHYLENE GLYCOL 3350 17 G PO PACK
34.0000 g | PACK | ORAL | Status: AC
  Administered 2022-01-17 (×2): 34 g via ORAL
  Filled 2022-01-17 (×2): qty 2

## 2022-01-17 NOTE — Progress Notes (Signed)
  Progress Note   Patient: Erik [name] [id number]:[id number] DOB: 1956/06/19 DOA: 01/13/2022     4 DOS: the patient was seen and examined on 01/17/2022   Brief hospital course: No notes on file  Assessment and Plan: * Bilateral cellulitis of lower leg --started on vanc and ceftriaxone, d/c'ed vanc Plan: --de-escalate to oral augmentin today  Ulcer of extremity due to chronic venous insufficiency (HCC) --tense edema, with weeping ulcers Plan: --cont IV lasix 40 BID --dressing change per order  Chronic systolic CHF (congestive heart failure) (HCC) Ruled out acute exacerbation BNP modestly elevated at 185 and chest x-ray clear.  BLE swelling chronic.   --current Echo with LVEF 45-50%, slightly improved from prior 40-45% in 2022. Plan: --cont IV lasix for LE edema --cont Lopressor  Ambulatory dysfunction Multifactorial related to venous insufficiency, physical deconditioning related to CHF and COPD requiring oxygen, recent fall Treat acute conditions --PT eval, rec SNF rehab, pt still hasn't agreed to go  Chronic a-fib [facility]) Patient developed a rash with Eliquis.  Was seen by his cardiologist on May 2023 and was not interested in starting Xarelto.   --cont home ASA 325 mg daily --cont home Lopressor  CAD (coronary artery disease) No complaints of chest pain --cont home ASA 325 mg daily --resume statin after discharge  Left hip pain --pt complained of severe left hip pain with movement, radiating down to his left knee, with also pain in his left lateral thigh. 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 Plan: --tramadol PRN --gabapentin 300 mg TID  Chronic respiratory failure with hypoxia (HCC) On 2-3L O2 at baseline.  COPD (chronic obstructive pulmonary disease) (HCC) Not acutely exacerbated Continue home inhalers.          Subjective:  Pt complained of severe left hip pain  and left thigh pain.  Xray showed Markedly severe bilateral femoroacetabular osteoarthritis with left-greater-than-right femoral head bone erosion.   Physical Exam:  Constitutional: NAD, AAOx3 HEENT: conjunctivae and lids normal, EOMI CV: No cyanosis.   RESP: normal respiratory effort, on 4L Extremities: edema in BLE less tense. Neuro: II - XII grossly intact.      Data Reviewed:  Family Communication:   Disposition: Status is: Inpatient   Planned Discharge Destination: Home  pt is still declining SNF rehab    Time spent: 50 minutes  Author: Darlin Priestly, MD 01/17/2022 7:39 PM  For on call review [email or web address].

## 2022-01-17 NOTE — Consult Note (Addendum)
WOC Nurse Consult Note: Reason for Consult: Consult requested to re-assess left posterior leg.  Initial WOC consult was performed 6/9. Wound type: Bilat legs with generalized edema and erythremia.  Pt states he developed blisters which ruptured and were draining a large amt yellow fluid recently. Right posterior calf with patchy areas of red moist partial thickness wounds, scattered across area approx 4X4X.1cm.  Small amt yellow weeping fluid.  Left posterior calf with 2 areas of full thickness stasis ulcers seperated by narrow intact bridge of skin; affected area approx 8X6X.2cm, 50% red, 50% yellow, mod amt yellow drainage.  Wound culture obtained from left posterior leg wound as requested and given to the bedside nurse. Dressing procedure/placement/frequency: Topical treatment orders provided for bedside nurses to perform as follows to promote drying and healing and provide light compression:  1. Place Xeroform gauze [place] [phone]) to left posterior leg wound Q day, then cover with ABD pad, and kerlex, beginning just behind toes, to below knee, then ace wrap in the same manner.  2.  Foam dressing to right posterior leg, change Q 3 days or PRN soiling.over any open or weeping areas.  Please re-consult if further assistance is needed.  Thank-you,  Cammie Mcgee MSN, RN, CWOCN, [place], CNS [phone]

## 2022-01-17 NOTE — Plan of Care (Signed)

## 2022-01-17 NOTE — Assessment & Plan Note (Addendum)
--  pt complained of severe left hip pain with movement, radiating down to his left knee, with also pain in his left lateral thigh. --xray showed Markedly severe bilateral femoroacetabular osteoarthritis with left-greater-than-right femoral head bone erosion. Plan: --tramadol PRN --gabapentin 300 mg TID (new) --MRI left hip, and depending on the result, consider ortho consult tomorrow for possible steroid injection.

## 2022-01-17 NOTE — TOC Progression Note (Addendum)
Transition of Care [facility]) - Progression Note    Patient Details  Name: Erik [name] MRN: [id number] Date of Birth: 11-19-1955  Transition of Care [facility]) CM/SW Contact  Truddie Hidden, RN Phone Number: 01/17/2022, 4:27 PM  Clinical Narrative:    Attempt to reach patient to discuss recommendation per therapy. Per MD message Patient had concerns regarding insurance authoriztion. No answer. Will reattempt.  4:42pm Attempt to reach patient to discuss discharge plan. No answer.        Expected Discharge Plan and Services                                                 Social Determinants of Health (SDOH) Interventions    Readmission Risk Interventions     No data to display

## 2022-01-17 NOTE — Progress Notes (Signed)
Physical Therapy Treatment Patient Details Name: Erik [name] MRN: [id number] DOB: Aug 28, 1955 Today's Date: 01/17/2022   History of Present Illness Erik [name] is a 66 y.o. male  with h/o HFrEF, COPD, HTN, CAD s/p MI, here with leg pain. Reports over the past week, he's had increasing bilateral, R>L swelling. R leg has gotten more painful and red, swollen as well. He has also had worsening cough, orthopnea, sputum production. Has had difficulty getting around due to his leg swelling and pain.    PT Comments    Pt received upright in bed. Agreeable to PT services.  Attempts made to remove pillow from underside of LLE which led to pt in extreme pain grimacing and crying out requiring pillow returned to underneath limb. Attempts made to exit bed to R and L side but unable even with max PT assist gently mobilizing LLE due to significant pain. Pt only able to tolerate gentle, bed level exercise on LLE deferring further attempts for OOB mobility at this time. RN and attending MD notified of worsening LLE pain limiting pt's ability to safely perform bed mobility. Pt educated further of inability to function safely at home with current pain levels. D/c recs remain appropriate at this time.    Recommendations for follow up therapy are one component of a multi-disciplinary discharge planning process, led by the attending physician.  Recommendations may be updated based on patient status, additional functional criteria and insurance authorization.  Follow Up Recommendations  Skilled nursing-short term rehab (<3 hours/day)     Assistance Recommended at Discharge Intermittent Supervision/Assistance  Patient can return home with the following A lot of help with walking and/or transfers;A lot of help with bathing/dressing/bathroom;Assist for transportation;Help with stairs or ramp for entrance;Assistance with cooking/housework   Equipment Recommendations  Other (comment) (tbd by next venue of care)     Recommendations for Other Services       Precautions / Restrictions Precautions Precautions: Fall Restrictions Weight Bearing Restrictions: No     Mobility  Bed Mobility Overal bed mobility: Needs Assistance             General bed mobility comments: Attempts to transfer to EOb to R/L. Significant pain with LLE mobility limiting pt from successful transfer Patient Response: Cooperative  Transfers                   General transfer comment: unable today due to pain    Ambulation/Gait                   Stairs             Wheelchair Mobility    Modified Rankin (Stroke Patients Only)       Balance                                            Cognition Arousal/Alertness: Awake/alert Behavior During Therapy: WFL for tasks assessed/performed Overall Cognitive Status: Within Functional Limits for tasks assessed                                          Exercises General Exercises - Lower Extremity Ankle Circles/Pumps: AROM, Strengthening, Both, 10 reps, Supine Short Arc Quad: AROM, Strengthening, Left, 10 reps, Supine, AAROM Hip ABduction/ADduction: AAROM, Strengthening, Left,  10 reps, Supine    General Comments        Pertinent Vitals/Pain Pain Assessment Pain Assessment: Faces Faces Pain Scale: Hurts whole lot Pain Location: left thigh/outer leg, lower back with movement Pain Descriptors / Indicators: Discomfort, Grimacing, Guarding, Moaning, Pins and needles, Stabbing Pain Intervention(s): Limited activity within patient's tolerance, Monitored during session, Repositioned    Home Living                          Prior Function            PT Goals (current goals can now be found in the care plan section) Acute Rehab PT Goals Patient Stated Goal: decreased pain and to go home PT Goal Formulation: With patient Time For Goal Achievement: 01/28/22 Potential to Achieve Goals:  Fair Progress towards PT goals: Progressing toward goals    Frequency    Min 2X/week      PT Plan Current plan remains appropriate    Co-evaluation              AM-PAC PT "6 Clicks" Mobility   Outcome Measure  Help needed turning from your back to your side while in a flat bed without using bedrails?: A Lot   Help needed moving to and from a bed to a chair (including a wheelchair)?: Total Help needed standing up from a chair using your arms (e.g., wheelchair or bedside chair)?: Total Help needed to walk in hospital room?: Total Help needed climbing 3-5 steps with a railing? : Total 6 Click Score: 6    End of Session Equipment Utilized During Treatment: Oxygen Activity Tolerance: Patient limited by pain Patient left: in bed;with call bell/phone within reach;with bed alarm set Nurse Communication: Mobility status PT Visit Diagnosis: Unsteadiness on feet (R26.81);Other abnormalities of gait and mobility (R26.89);Muscle weakness (generalized) (M62.81);History of falling (Z91.81)     Time: 1572-6203 PT Time Calculation (min) (ACUTE ONLY): 17 min  Charges:  $Therapeutic Exercise: 8-22 mins                    Delphia Grates. Fairly IV, PT, DPT Physical Therapist- [place]  [facility]  01/17/2022, 2:50 PM

## 2022-01-18 ENCOUNTER — Inpatient Hospital Stay: Payer: Medicare Other

## 2022-01-18 DIAGNOSIS — L03116 Cellulitis of left lower limb: Secondary | ICD-10-CM | POA: Diagnosis not present

## 2022-01-18 DIAGNOSIS — L03115 Cellulitis of right lower limb: Secondary | ICD-10-CM | POA: Diagnosis not present

## 2022-01-18 LAB — CULTURE, BLOOD (ROUTINE X 2)
Culture: NO GROWTH
Culture: NO GROWTH
Special Requests: ADEQUATE

## 2022-01-18 LAB — MAGNESIUM: Magnesium: 2 mg/dL (ref 1.7–2.4)

## 2022-01-18 LAB — BASIC METABOLIC PANEL
Anion gap: 5 (ref 5–15)
BUN: 16 mg/dL (ref 8–23)
CO2: 37 mmol/L — ABNORMAL HIGH (ref 22–32)
Calcium: 8.7 mg/dL — ABNORMAL LOW (ref 8.9–10.3)
Chloride: 92 mmol/L — ABNORMAL LOW (ref 98–111)
Creatinine, Ser: 0.71 mg/dL (ref 0.61–1.24)
GFR, Estimated: >60 mL/min (ref >60)
Glucose, Bld: 99 mg/dL (ref 70–99)
Potassium: 4.1 mmol/L (ref 3.5–5.1)
Sodium: 134 mmol/L — ABNORMAL LOW (ref 135–145)

## 2022-01-18 LAB — CBC
HCT: 42.5 % (ref 39.0–52.0)
Hemoglobin: 13.7 g/dL (ref 13.0–17.0)
MCH: 30.3 pg (ref 26.0–34.0)
MCHC: 32.2 g/dL (ref 30.0–36.0)
MCV: 94 fL (ref 80.0–100.0)
Platelets: 369 10*3/uL (ref 150–400)
RBC: 4.52 MIL/uL (ref 4.22–5.81)
RDW: 12.6 % (ref 11.5–15.5)
WBC: 14.6 10*3/uL — ABNORMAL HIGH (ref 4.0–10.5)
nRBC: 0 % (ref 0.0–0.2)

## 2022-01-18 MED ORDER — POLYETHYLENE GLYCOL 3350 17 G PO PACK
34.0000 g | PACK | ORAL | Status: DC
  Administered 2022-01-18 (×2): 34 g via ORAL
  Filled 2022-01-18 (×2): qty 2

## 2022-01-18 MED ORDER — GADOBUTROL 1 MMOL/ML IV SOLN
10.0000 mL | Freq: Once | INTRAVENOUS | Status: AC | PRN
Start: 2022-01-18 — End: 2022-01-18
  Administered 2022-01-18: 10 mL via INTRAVENOUS

## 2022-01-18 NOTE — Progress Notes (Signed)
Progress Note   Patient: Erik [name] [id number]:[id number] DOB: 1956/01/11 DOA: 01/13/2022     5 DOS: the patient was seen and examined on 01/18/2022   HPI / Brief hospital course: Erik [name] is a 66 y.o. male with medical history significant for CAD s/p PCI, HFrEF (last echo 40% to 45% 09/2020), permanent A-fib on aspirin (not on anticoagulation per preference), COPD on home O2, arthritis and chronic venous stasis, last seen by his cardiologist on 12/17/2020 with a complaint at the time of increased fluid retention and shortness of breath causing difficulty ambulating, at which time his torsemide was increased to twice daily for a few days, who presents to the ED with a similar complaint of worsening of his chronic leg swelling now associated with weeping and worsening ability to ambulate, resulting in a mechanical fall 5 days prior which in turn reduce his ability to get around even more.  At baseline he ambulates with a walker.  Assessment and Plan: * Bilateral cellulitis of lower leg --started on vanc and ceftriaxone, d/c'ed vanc.  Received 4 days of ceftriaxone and transitioned to augmentin Plan: --cont augmentin to finish 7-day course  Ulcer of extremity due to chronic venous insufficiency (HCC) --tense edema, with weeping ulcers.  Improved with IV diuresis. Plan: --cont IV lasix 40 BID --dressing change per order  Chronic systolic CHF (congestive heart failure) (HCC) Ruled out acute exacerbation BNP modestly elevated at 185 and chest x-ray clear.  BLE swelling chronic.   --current Echo with LVEF 45-50%, slightly improved from prior 40-45% in 2022. Plan: --cont IV lasix for LE edema --cont Lopressor  Ambulatory dysfunction Multifactorial related to venous insufficiency, physical deconditioning related to CHF and COPD requiring oxygen, recent fall Treat acute conditions --PT eval, rec SNF rehab, pt still hasn't agreed to go  Chronic a-fib [facility]) Patient developed a rash with  Eliquis.  Was seen by his cardiologist on May 2023 and was not interested in starting Xarelto.   --cont home ASA 325 mg daily --cont home Lopressor  CAD (coronary artery disease) No complaints of chest pain --cont home ASA 325 mg daily --resume statin after discharge  Left hip pain --pt complained of severe left hip pain with movement, radiating down to his left knee, with also pain in his left lateral thigh. --xray showed Markedly severe bilateral femoroacetabular osteoarthritis with left-greater-than-right femoral head bone erosion. Plan: --tramadol PRN --gabapentin 300 mg TID (new) --MRI left hip, and depending on the result, consider ortho consult tomorrow for possible steroid injection.  Chronic respiratory failure with hypoxia (HCC) On 2-3L O2 at baseline.  COPD (chronic obstructive pulmonary disease) (HCC) Not acutely exacerbated Continue home inhalers.          Subjective:  Pt continued to complain of pain in left hip and shooting pain down his left thigh, with left thigh tender to palpation.    Pt still refusing to go to SNF rehab.  Pt hasn't been able to get up on his own since presentation, but he said he knows how to manage better at home.   Physical Exam:  Constitutional: NAD, AAOx3 HEENT: conjunctivae and lids normal, EOMI CV: No cyanosis.   RESP: normal respiratory effort, on 3L Extremities: erythema and edema in BLE improved SKIN: warm, dry Neuro: II - XII grossly intact.   Psych: Normal mood and affect.     Data Reviewed:  Family Communication:   Disposition: Status is: Inpatient   Planned Discharge Destination: Home  pt is still declining SNF  rehab    Time spent: 35 minutes  Author: Darlin Priestly, MD 01/18/2022 8:14 PM  For on call review [email or web address].

## 2022-01-18 NOTE — Plan of Care (Signed)
  Problem: Education: Goal: Ability to demonstrate management of disease process will improve Outcome: Progressing Goal: Ability to verbalize understanding of medication therapies will improve Outcome: Progressing Goal: Individualized Educational Video(s) Outcome: Progressing   Problem: Activity: Goal: Capacity to carry out activities will improve Outcome: Progressing   Problem: Cardiac: Goal: Ability to achieve and maintain adequate cardiopulmonary perfusion will improve Outcome: Progressing   Problem: Clinical Measurements: Goal: Ability to avoid or minimize complications of infection will improve Outcome: Progressing   Problem: Skin Integrity: Goal: Skin integrity will improve Outcome: Progressing   Problem: Education: Goal: Knowledge of General Education information will improve Description: Including pain rating scale, medication(s)/side effects and non-pharmacologic comfort measures Outcome: Progressing   Problem: Health Behavior/Discharge Planning: Goal: Ability to manage health-related needs will improve Outcome: Progressing   Problem: Clinical Measurements: Goal: Ability to maintain clinical measurements within normal limits will improve Outcome: Progressing Goal: Will remain free from infection Outcome: Progressing Goal: Diagnostic test results will improve Outcome: Progressing Goal: Respiratory complications will improve Outcome: Progressing Goal: Cardiovascular complication will be avoided Outcome: Progressing   Problem: Activity: Goal: Risk for activity intolerance will decrease Outcome: Progressing   Problem: Nutrition: Goal: Adequate nutrition will be maintained Outcome: Progressing   Problem: Coping: Goal: Level of anxiety will decrease Outcome: Progressing   Problem: Elimination: Goal: Will not experience complications related to bowel motility Outcome: Progressing Goal: Will not experience complications related to urinary  retention Outcome: Progressing   Problem: Pain Managment: Goal: General experience of comfort will improve Outcome: Progressing   Problem: Safety: Goal: Ability to remain free from injury will improve Outcome: Progressing   Problem: Skin Integrity: Goal: Risk for impaired skin integrity will decrease Outcome: Progressing

## 2022-01-18 NOTE — Progress Notes (Signed)
PT Cancellation Note  Patient Details Name: Erik [name] MRN: [id number] DOB: 01-03-1956   Cancelled Treatment:    Reason Eval/Treat Not Completed: Pain limiting ability to participate;Other (comment). Per rounds, plan for ortho consult and possible L hip injection to assist in pain management for improved mobility. PT to hold until ortho consult. Will continue to follow and re-attempt once plan is in place for better pain management to optimize success in mobility.   Delphia Grates. Fairly IV, PT, DPT Physical Therapist- [place]  [facility]  01/18/2022, 12:27 PM

## 2022-01-18 NOTE — Care Management Important Message (Signed)
Important Message  Patient Details  Name: Erik [name] MRN: [id number] Date of Birth: 07/11/1956   Medicare Important Message Given:  Yes     Johnell Comings 01/18/2022, 1:52 PM

## 2022-01-18 NOTE — TOC Progression Note (Signed)
Transition of Care [facility]) - Progression Note    Patient Details  Name: Erik [name] MRN: [id number] Date of Birth: June 28, 1956  Transition of Care [facility]) CM/SW Contact  Truddie Hidden, RN Phone Number: 01/18/2022, 2:41 PM  Clinical Narrative:    11:20pm Spoke with patient at the bedside. Advised of therapy recommendation and the process of obtaining a bed offer. Explained authorization would require approval. Patient advised stay would be short term for conditioing. Patient not agreeable to SNF but agreeable to home health. No preference of home health. Referral sent to [facility].         Expected Discharge Plan and Services                                                 Social Determinants of Health (SDOH) Interventions    Readmission Risk Interventions     No data to display

## 2022-01-19 DIAGNOSIS — I872 Venous insufficiency (chronic) (peripheral): Secondary | ICD-10-CM

## 2022-01-19 DIAGNOSIS — I482 Chronic atrial fibrillation, unspecified: Secondary | ICD-10-CM | POA: Diagnosis not present

## 2022-01-19 DIAGNOSIS — L98499 Non-pressure chronic ulcer of skin of other sites with unspecified severity: Secondary | ICD-10-CM

## 2022-01-19 DIAGNOSIS — I2510 Atherosclerotic heart disease of native coronary artery without angina pectoris: Secondary | ICD-10-CM

## 2022-01-19 DIAGNOSIS — I2583 Coronary atherosclerosis due to lipid rich plaque: Secondary | ICD-10-CM

## 2022-01-19 DIAGNOSIS — J9611 Chronic respiratory failure with hypoxia: Secondary | ICD-10-CM

## 2022-01-19 DIAGNOSIS — L03116 Cellulitis of left lower limb: Secondary | ICD-10-CM | POA: Diagnosis not present

## 2022-01-19 LAB — BASIC METABOLIC PANEL
Anion gap: 9 (ref 5–15)
BUN: 22 mg/dL (ref 8–23)
CO2: 32 mmol/L (ref 22–32)
Calcium: 9 mg/dL (ref 8.9–10.3)
Chloride: 90 mmol/L — ABNORMAL LOW (ref 98–111)
Creatinine, Ser: 0.82 mg/dL (ref 0.61–1.24)
GFR, Estimated: >60 mL/min (ref >60)
Glucose, Bld: 177 mg/dL — ABNORMAL HIGH (ref 70–99)
Potassium: 4 mmol/L (ref 3.5–5.1)
Sodium: 131 mmol/L — ABNORMAL LOW (ref 135–145)

## 2022-01-19 LAB — CBC
HCT: 44.1 % (ref 39.0–52.0)
Hemoglobin: 14.2 g/dL (ref 13.0–17.0)
MCH: 29.8 pg (ref 26.0–34.0)
MCHC: 32.2 g/dL (ref 30.0–36.0)
MCV: 92.6 fL (ref 80.0–100.0)
Platelets: 433 10*3/uL — ABNORMAL HIGH (ref 150–400)
RBC: 4.76 MIL/uL (ref 4.22–5.81)
RDW: 12.5 % (ref 11.5–15.5)
WBC: 16.4 10*3/uL — ABNORMAL HIGH (ref 4.0–10.5)
nRBC: 0 % (ref 0.0–0.2)

## 2022-01-19 LAB — MAGNESIUM: Magnesium: 2 mg/dL (ref 1.7–2.4)

## 2022-01-19 LAB — BRAIN NATRIURETIC PEPTIDE: B Natriuretic Peptide: 122.2 pg/mL — ABNORMAL HIGH (ref 0.0–100.0)

## 2022-01-19 MED ORDER — POLYETHYLENE GLYCOL 3350 17 G PO PACK
17.0000 g | PACK | Freq: Every day | ORAL | Status: AC
  Administered 2022-01-20: 17 g via ORAL
  Filled 2022-01-19 (×2): qty 1

## 2022-01-19 MED ORDER — SODIUM CHLORIDE 0.9 % IV SOLN
2.0000 g | INTRAVENOUS | Status: DC
  Administered 2022-01-19 – 2022-01-23 (×5): 2 g via INTRAVENOUS
  Filled 2022-01-19: qty 20
  Filled 2022-01-19 (×2): qty 2
  Filled 2022-01-19: qty 20
  Filled 2022-01-19: qty 2

## 2022-01-19 MED ORDER — TORSEMIDE 20 MG PO TABS
10.0000 mg | ORAL_TABLET | Freq: Every day | ORAL | Status: DC
  Administered 2022-01-20 – 2022-01-22 (×3): 10 mg via ORAL
  Filled 2022-01-19 (×3): qty 1

## 2022-01-19 MED ORDER — VANCOMYCIN HCL IN DEXTROSE 1-5 GM/200ML-% IV SOLN
1000.0000 mg | Freq: Once | INTRAVENOUS | Status: AC
  Administered 2022-01-19: 1000 mg via INTRAVENOUS
  Filled 2022-01-19: qty 200

## 2022-01-19 MED ORDER — VANCOMYCIN HCL 1500 MG/300ML IV SOLN
1500.0000 mg | INTRAVENOUS | Status: DC
  Administered 2022-01-20 – 2022-01-21 (×2): 1500 mg via INTRAVENOUS
  Filled 2022-01-19 (×3): qty 300

## 2022-01-19 MED ORDER — TORSEMIDE 20 MG PO TABS
10.0000 mg | ORAL_TABLET | Freq: Every day | ORAL | Status: DC
Start: 2022-01-19 — End: 2022-01-19

## 2022-01-19 MED ORDER — FLUTICASONE FUROATE-VILANTEROL 100-25 MCG/ACT IN AEPB: 1.0000 | INHALATION_SPRAY | Freq: Every day | RESPIRATORY_TRACT | Status: DC

## 2022-01-19 MED ORDER — VANCOMYCIN HCL 1500 MG/300ML IV SOLN
1500.0000 mg | Freq: Once | INTRAVENOUS | Status: AC
  Administered 2022-01-19: 1500 mg via INTRAVENOUS
  Filled 2022-01-19: qty 300

## 2022-01-19 MED ORDER — UMECLIDINIUM BROMIDE 62.5 MCG/ACT IN AEPB: 1.0000 | INHALATION_SPRAY | Freq: Every day | RESPIRATORY_TRACT | Status: DC

## 2022-01-19 MED ORDER — ROSUVASTATIN CALCIUM 10 MG PO TABS
10.0000 mg | ORAL_TABLET | Freq: Every day | ORAL | Status: DC
  Administered 2022-01-19 – 2022-01-25 (×7): 10 mg via ORAL
  Filled 2022-01-19 (×7): qty 1

## 2022-01-19 NOTE — Consult Note (Signed)
Pharmacy Antibiotic Note  Erik [name] is a 66 y.o. male admitted on 01/13/2022 with cellulitis.  Pharmacy has been consulted for vancomycin dosing.  Plan: Vancomycin 2.5gm IV once followed by Vancomycin 1500mg  IV every 24 hours Goal AUC 400-550  Est AUC: 490.0 Est Cmax: 37.0  Est Cmin: 10.6 Calculated with SCr 0.82   Height: 5\' 10"  (177.8 cm) Weight: 109.9 kg (242 lb 4.6 oz) IBW/kg (Calculated) : 73  Temp (24hrs), Avg:98.4 F (36.9 C), Min:97.8 F (36.6 C), Max:99.3 F (37.4 C)  Recent Labs  Lab 01/13/22 1607 01/13/22 1951 01/14/22 0645 01/15/22 0606 01/16/22 0419 01/17/22 0417 01/18/22 0552 01/19/22 0431  WBC 10.5  --   --  8.6 10.7* 13.7* 14.6* 16.4*  CREATININE 0.68  --    < > 0.71 0.70 0.82 0.71 0.82  LATICACIDVEN 1.6 1.4  --   --   --   --   --   --    < > = values in this interval not displayed.    Estimated Creatinine Clearance: 108.6 mL/min (by C-G formula based on SCr of 0.82 mg/dL).    Allergies  Allergen Reactions   Benadryl [Diphenhydramine Hcl] Shortness Of Breath   Morphine And Related     Antimicrobials this admission: 6/8 Vancomycin 2g x1 in ED 6/8 Ceftriaxone >> 6/11 > Augmentin > 6/13 6/14 Vancomycin >>    Microbiology results: 6/8 BCx: NGTD 6/12 Bcx: sent 6/12 Wound cx: Rare GNR + Rare GPR  Thank you for allowing pharmacy to be a part of this patient's care.  8/12 01/19/2022 7:52 AM

## 2022-01-19 NOTE — Progress Notes (Signed)
[facility] PROGRESS NOTE    Progress Note  Erik DatesJohnnie L [name]  NWG:[id number] DOB: 04-18-1956 DOA: 01/13/2022 PCP: Sallyanne KusterAbernathy, Alyssa, NP     Brief Narrative:   Erik [name] is an 66 y.o. male past medical history significant for CAD status post PCI with chronic systolic heart failure by echo on February 2022 with an EF of 40%, permanent atrial fibrillation on aspirin (not on anticoagulation due to preference) COPD on home oxygen, chronic venous stasis who saw his cardiologist in May 2023 was complaining of increased fluid retention and shortness of breath at that time.  We will start her on torsemide and increase twice a day comes into the ED with chronic lower extremity swelling associated with dyspnea on exertion.  He eventually had a mechanical fall 5 days prior to admission which reduces mobility.   Assessment/Plan:   Bilateral cellulitis of lower leg Started empirically on IV vancomycin and Rocephin.   Blood cultures on admission 01/13/2022 were negative. Received 4 days of Rocephin, which was then transition to oral Augmentin. His white count is trending up consistently for the last 4 days..  Tmax 99.3. We will transition to IV Vanco and Rocephin. Wound care was consulted recommended place Xeroform gauze then cover with ABD pad and Kerlix and wrapped on Ace bandages. Recheck blood cultures today 01/19/2022.  Acute on chronic systolic heart failure: Estimated dry rate around 102-104 kg. He appears euvolemic on physical exam his urine is concentrated. His weight this morning is 109 kg, we will have to recheck that. He is negative about 15 L. Transition to oral diuretic therapy. Check BNP. We will hold ACE inhibitor for now.  Recheck basic metabolic panel tomorrow morning.  Ulcer of extremity due to chronic venous insufficiency (HCC) Continue current dressing changes per nurse.  Ambulatory dysfunction Multifactorial related to venous insufficiency and deconditioning in the  setting of heart failure and COPD which required oxygen with a recent fall. Physical therapy evaluated the patient recommended skilled nursing facility. The patient has not agreed to go to skilled nursing facility.  Chronic atrial fibrillation: Was seen by cardiology in May 2023. Not interested on anticoagulation currently on aspirin 325 and Lopressor.  Coronary artery disease: Chest pain-free continue aspirin resume statins after discharge.  Left hip pain: X-ray of the hip showed bilateral osteoarthritis left greater than right. Continue tramadol and gabapentin. Continue to work with physical therapy. MRI of the hip is pending  DVT prophylaxis: lovenox Family Communication:none Status is: Inpatient Remains inpatient appropriate because: Cellulitis will need skilled nursing facility placement.    Code Status:     Code Status Orders  (From admission, onward)           Start     Ordered   01/13/22 2115  Full code  Continuous        01/13/22 2117           Code Status History     Date Active Date Inactive Code Status Order ID Comments User Context   08/14/2017 0126 08/19/2017 1610 Full Code [id number]  Cammy CopaMaier, Angela, MD Inpatient         IV Access:   Peripheral IV   Procedures and diagnostic studies:   DG HIP UNILAT WITH PELVIS 2-3 VIEWS LEFT  Result Date: 01/17/2022 CLINICAL DATA:  Chronic hip pain. EXAM: DG HIP (WITH OR WITHOUT PELVIS) 2-3V LEFT COMPARISON:  None Available. FINDINGS: There is diffuse decreased bone mineralization. Severe bilateral femoroacetabular joint space narrowing with diffuse bone-on-bone contact, subchondral  sclerosis and cystic change. Mild-to-moderate right and moderate left superior femoral head and superior acetabular cortical flattening/remodeling. There appears to be an approximate 2.4 cm ossicle/loose body within the inferior left femoroacetabular joint. Likely bilateral sacroiliac joint space narrowing. Within the limitations of  diffuse decreased bone mineralization, no acute fracture is seen. Mild vascular calcifications. IMPRESSION: Markedly severe bilateral femoroacetabular osteoarthritis with left-greater-than-right femoral head bone erosion. Electronically Signed   By: Neita Garnet M.D.   On: 01/17/2022 15:51     Medical Consultants:   None.   Subjective:    Erik [name] no complaints related his breathing is better than when he came in. Does not like being fluid restricted.  Objective:    Vitals:   01/18/22 1648 01/18/22 1953 01/19/22 0455 01/19/22 0500  BP: 131/84 122/82 107/85   Pulse: 84 97 (!) 108   Resp: Temp: 98.1 F (36.7 C) 99.3 F (37.4 C) 97.8 F (36.6 C)   TempSrc: Oral     SpO2: 99% 97% 96%   Weight:    109.9 kg  Height:       SpO2: 96 % O2 Flow Rate (L/min): 3 L/min   Intake/Output Summary (Last 24 hours) at 01/19/2022 0657 Last data filed at 01/18/2022 1700 Gross per 24 hour  Intake 240 ml  Output 1800 ml  Net -1560 ml   Filed Weights   01/16/22 0435 01/18/22 0500 01/19/22 0500  Weight: 107.1 kg 105.2 kg 109.9 kg    Exam: General exam: In no acute distress. Respiratory system: Good air movement and clear to auscultation. Cardiovascular system: S1 & S2 heard, RRR.  Negative JVD Gastrointestinal system: Abdomen is nondistended, soft and nontender.  Extremities: 2+ edema on the right, minimal edema on the left Skin: Left leg is warm to touch appears erythematous no purulent drainage. Psychiatry: Judgement and insight appear normal. Mood & affect appropriate.    Data Reviewed:    Labs: Basic Metabolic Panel: Recent Labs  Lab 01/15/22 0606 01/16/22 0419 01/17/22 0417 01/18/22 0552 01/19/22 0431  NA 136 137 137 134* 131*  K 4.1 3.9 4.0 4.1 4.0  CL 95* 98 96* 92* 90*  CO2 34* 33* 34* 37* 32  GLUCOSE 94 123* 107* 99 177*  BUN 24* CREATININE 0.71 0.70 0.82 0.71 0.82  CALCIUM 9.1 8.8* 8.7* 8.7* 9.0  MG 2.1 2.1 2.1 2.0 2.0    GFR Estimated Creatinine Clearance: 108.6 mL/min (by C-G formula based on SCr of 0.82 mg/dL). Liver Function Tests: Recent Labs  Lab 01/13/22 1607  AST 29  ALT 17  ALKPHOS 64  BILITOT 1.1  PROT 6.2*  ALBUMIN 3.2*   No results for input(s): "LIPASE", "AMYLASE" in the last 168 hours. No results for input(s): "AMMONIA" in the last 168 hours. Coagulation profile No results for input(s): "INR", "PROTIME" in the last 168 hours. COVID-19 Labs  No results for input(s): "DDIMER", "FERRITIN", "LDH", "CRP" in the last 72 hours.  No results found for: "SARSCOV2NAA"  CBC: Recent Labs  Lab 01/13/22 1607 01/15/22 0606 01/16/22 0419 01/17/22 0417 01/18/22 0552 01/19/22 0431  WBC 10.5 8.6 10.7* 13.7* 14.6* 16.4*  NEUTROABS 7.0  --   --   --   --   --   HGB 13.8 13.3 12.7* 13.7 13.7 14.2  HCT 44.0 42.5 39.1 42.9 42.5 44.1  MCV 95.7 94.9 92.7 94.5 94.0 92.6  PLT 363 358 358 399 369 433*   Cardiac Enzymes: No  results for input(s): "CKTOTAL", "CKMB", "CKMBINDEX", "TROPONINI" in the last 168 hours. BNP (last 3 results) No results for input(s): "PROBNP" in the last 8760 hours. CBG: No results for input(s): "GLUCAP" in the last 168 hours. D-Dimer: No results for input(s): "DDIMER" in the last 72 hours. Hgb A1c: No results for input(s): "HGBA1C" in the last 72 hours. Lipid Profile: No results for input(s): "CHOL", "HDL", "LDLCALC", "TRIG", "CHOLHDL", "LDLDIRECT" in the last 72 hours. Thyroid function studies: No results for input(s): "TSH", "T4TOTAL", "T3FREE", "THYROIDAB" in the last 72 hours.  Invalid input(s): "FREET3" Anemia work up: No results for input(s): "VITAMINB12", "FOLATE", "FERRITIN", "TIBC", "IRON", "RETICCTPCT" in the last 72 hours. Sepsis Labs: Recent Labs  Lab 01/13/22 1607 01/13/22 1951 01/15/22 0606 01/16/22 0419 01/17/22 0417 01/18/22 0552 01/19/22 0431  WBC 10.5  --    < > 10.7* 13.7* 14.6* 16.4*  LATICACIDVEN 1.6 1.4  --   --   --   --   --    < >  = values in this interval not displayed.   Microbiology Recent Results (from the past 240 hour(s))  Blood culture (routine x 2)     Status: None   Collection Time: 01/13/22  7:52 PM   Specimen: BLOOD  Result Value Ref Range Status   Specimen Description BLOOD RIGHT [facility]  Final   Special Requests   Final    BOTTLES DRAWN AEROBIC AND ANAEROBIC Blood Culture adequate volume   Culture   Final    NO GROWTH 5 DAYS Performed at [facility], [address] [place], [place] [address]    Report Status 01/18/2022 FINAL  Final  Blood culture (routine x 2)     Status: None   Collection Time: 01/13/22  7:53 PM   Specimen: BLOOD  Result Value Ref Range Status   Specimen Description BLOOD LEFT ANTECUBITAL  Final   Special Requests   Final    BOTTLES DRAWN AEROBIC AND ANAEROBIC Blood Culture results may not be optimal due to an inadequate volume of blood received in culture bottles   Culture   Final    NO GROWTH 5 DAYS Performed at [facility], [address] [place], [place] [address]    Report Status 01/18/2022 FINAL  Final  Aerobic Culture w Gram Stain (superficial specimen)     Status: None (Preliminary result)   Collection Time: 01/17/22 10:00 AM   Specimen: Wound  Result Value Ref Range Status   Specimen Description   Final    WOUND Performed at [facility], [address], [place], [place] [address]    Special Requests   Final    LEFT LEG Performed at [facility], [address] [place], [place] [address]    Gram Stain   Final    NO WBC SEEN RARE Romie Minus NEGATIVE RODS RARE GRAM POSITIVE RODS    Culture   Final    TOO YOUNG TO READ Performed at [facility], [address] [place], [place] [address]    Report Status PENDING  Incomplete     Medications:    amoxicillin-clavulanate  1 tablet Oral Q12H   aspirin EC  325 mg Oral Daily   docusate sodium  100 mg Oral Daily   enoxaparin (LOVENOX) injection  0.5 mg/kg Subcutaneous  Q24H   fluticasone furoate-vilanterol  1 puff Inhalation Daily   furosemide  40 mg Intravenous BID   gabapentin  300 mg Oral TID   metoprolol tartrate  25  mg Oral BID   saccharomyces boulardii  250 mg Oral BID   umeclidinium bromide  1 puff Inhalation Daily   Continuous Infusions:    LOS: 6 days   Marinda Elk  [facility]  01/19/2022, 6:57 AM

## 2022-01-19 NOTE — Progress Notes (Signed)
Physical Therapy Treatment Patient Details Name: Erik [name] MRN: [id number] DOB: 06-07-1956 Today's Date: 01/19/2022   History of Present Illness Erik [name] is a 66 y.o. male  with h/o HFrEF, COPD, HTN, CAD s/p MI, here with leg pain. Reports over the past week, he's had increasing bilateral, R>L swelling. R leg has gotten more painful and red, swollen as well. He has also had worsening cough, orthopnea, sputum production. Has had difficulty getting around due to his leg swelling and pain.    PT Comments    Pt received in Semi-Fowler's position and agreeable to therapy.  Pt notes his back is hurting from sitting in the bed for so long and would like to get out of bed, but notes his L LE is extremely painful with the slightest of touch.  Pt does require handrail assistance in order to come upright into seated position at EOB.  Pt participated in STS x3 for bed change and for exercise.  Pt then attempted to perform peri-care with wipe on his buttocks and rotating towards the R side, he reports he heard a loud "pop" in his back and believe his back "snapped".  Pt required assistance in order to sit back down to EOB.  While there, pt did not seem to continue having severe pain in the back.  Pt assisted back in bed with fresh linens and gown replaced.  Current discharge plans to SNF remain appropriate at this time.  Pt will continue to benefit from skilled therapy in order to address deficits listed below.     Recommendations for follow up therapy are one component of a multi-disciplinary discharge planning process, led by the attending physician.  Recommendations may be updated based on patient status, additional functional criteria and insurance authorization.  Follow Up Recommendations  Skilled nursing-short term rehab (<3 hours/day)     Assistance Recommended at Discharge    Patient can return home with the following A lot of help with walking and/or transfers;A lot of help with  bathing/dressing/bathroom;Assist for transportation;Help with stairs or ramp for entrance;Assistance with cooking/housework   Equipment Recommendations  Other (comment)    Recommendations for Other Services       Precautions / Restrictions Precautions Precautions: Fall Restrictions Weight Bearing Restrictions: No     Mobility  Bed Mobility Overal bed mobility: Needs Assistance Bed Mobility: Supine to Sit, Sit to Supine     Supine to sit: Max assist, HOB elevated Sit to supine: Max assist   General bed mobility comments: Transferred to the R with prior removal of pillows elevating LEs. Significant pain with LLE mobility but still performed.    Transfers Overall transfer level: Needs assistance Equipment used: Rolling walker (2 wheels) Transfers: Sit to/from Stand Sit to Stand: Min guard, From elevated surface           General transfer comment: unable today due to pain    Ambulation/Gait               General Gait Details: deferred due to pain in back/L LE.   Stairs             Wheelchair Mobility    Modified Rankin (Stroke Patients Only)       Balance Overall balance assessment: Needs assistance Sitting-balance support: Feet supported Sitting balance-Leahy Scale: Fair Sitting balance - Comments: Pt did not lose balance, but felt like his knees were buckling after attempting to wipe his buttocks, and had to be assisted to be seated EOB.  Standing balance support: During functional activity, Reliant on assistive device for balance                                Cognition Arousal/Alertness: Awake/alert Behavior During Therapy: WFL for tasks assessed/performed Overall Cognitive Status: Within Functional Limits for tasks assessed                                          Exercises General Exercises - Lower Extremity Ankle Circles/Pumps: AROM, Strengthening, Both, 10 reps, Supine Short Arc Quad: AROM,  Strengthening, Left, 10 reps, Supine, AAROM Hip ABduction/ADduction: AAROM, Strengthening, Left, 10 reps, Supine    General Comments        Pertinent Vitals/Pain Pain Assessment Pain Assessment: Faces Faces Pain Scale: Hurts worst Pain Location: left thigh/outer leg, lower back with movement Pain Descriptors / Indicators: Discomfort, Grimacing, Guarding, Moaning, Pins and needles, Stabbing Pain Intervention(s): Limited activity within patient's tolerance, Monitored during session, Repositioned, RN gave pain meds during session    Home Living                          Prior Function            PT Goals (current goals can now be found in the care plan section) Acute Rehab PT Goals Patient Stated Goal: decreased pain and to go home PT Goal Formulation: With patient Time For Goal Achievement: 01/28/22 Potential to Achieve Goals: Fair Progress towards PT goals: Progressing toward goals    Frequency    Min 2X/week      PT Plan Current plan remains appropriate    Co-evaluation              AM-PAC PT "6 Clicks" Mobility   Outcome Measure  Help needed turning from your back to your side while in a flat bed without using bedrails?: A Lot Help needed moving from lying on your back to sitting on the side of a flat bed without using bedrails?: A Lot Help needed moving to and from a bed to a chair (including a wheelchair)?: Total Help needed standing up from a chair using your arms (e.g., wheelchair or bedside chair)?: A Lot Help needed to walk in hospital room?: Total Help needed climbing 3-5 steps with a railing? : Total 6 Click Score: 9    End of Session Equipment Utilized During Treatment: Oxygen Activity Tolerance: Patient limited by pain Patient left: in bed;with call bell/phone within reach;with bed alarm set Nurse Communication: Mobility status PT Visit Diagnosis: Unsteadiness on feet (R26.81);Other abnormalities of gait and mobility (R26.89);Muscle  weakness (generalized) (M62.81);History of falling (Z91.81)     Time: 4431-5400 PT Time Calculation (min) (ACUTE ONLY): 30 min  Charges:  $Therapeutic Exercise: 8-22 mins $Therapeutic Activity: 8-22 mins                     Erik [name], PT, DPT 01/19/22, 5:50 PM    Erik [name] 01/19/2022, 5:46 PM

## 2022-01-20 ENCOUNTER — Inpatient Hospital Stay: Payer: Medicare Other

## 2022-01-20 DIAGNOSIS — L03115 Cellulitis of right lower limb: Secondary | ICD-10-CM | POA: Diagnosis not present

## 2022-01-20 DIAGNOSIS — L03116 Cellulitis of left lower limb: Secondary | ICD-10-CM | POA: Diagnosis not present

## 2022-01-20 LAB — BASIC METABOLIC PANEL
Anion gap: 7 (ref 5–15)
BUN: 18 mg/dL (ref 8–23)
CO2: 35 mmol/L — ABNORMAL HIGH (ref 22–32)
Calcium: 9 mg/dL (ref 8.9–10.3)
Chloride: 93 mmol/L — ABNORMAL LOW (ref 98–111)
Creatinine, Ser: 0.65 mg/dL (ref 0.61–1.24)
GFR, Estimated: >60 mL/min (ref >60)
Glucose, Bld: 106 mg/dL — ABNORMAL HIGH (ref 70–99)
Potassium: 4.2 mmol/L (ref 3.5–5.1)
Sodium: 135 mmol/L (ref 135–145)

## 2022-01-20 LAB — AEROBIC CULTURE W GRAM STAIN (SUPERFICIAL SPECIMEN): Gram Stain: NONE SEEN

## 2022-01-20 LAB — CBC
HCT: 41.8 % (ref 39.0–52.0)
Hemoglobin: 13.6 g/dL (ref 13.0–17.0)
MCH: 30.1 pg (ref 26.0–34.0)
MCHC: 32.5 g/dL (ref 30.0–36.0)
MCV: 92.5 fL (ref 80.0–100.0)
Platelets: 398 10*3/uL (ref 150–400)
RBC: 4.52 MIL/uL (ref 4.22–5.81)
RDW: 12.5 % (ref 11.5–15.5)
WBC: 11.7 10*3/uL — ABNORMAL HIGH (ref 4.0–10.5)
nRBC: 0 % (ref 0.0–0.2)

## 2022-01-20 MED ORDER — TRAMADOL HCL 50 MG PO TABS
100.0000 mg | ORAL_TABLET | Freq: Four times a day (QID) | ORAL | Status: DC | PRN
  Administered 2022-01-20 – 2022-01-24 (×8): 100 mg via ORAL
  Filled 2022-01-20 (×8): qty 2

## 2022-01-20 MED ORDER — TORSEMIDE 20 MG PO TABS: 10.0000 mg | ORAL_TABLET | Freq: Every day | ORAL | Status: DC

## 2022-01-20 NOTE — Progress Notes (Signed)
[facility] PROGRESS NOTE    Progress Note  Erik [name]  GLO:[id number] DOB: [birth year] DOA: 01/13/2022 PCP: Sallyanne Kuster, NP     Brief Narrative:   Erik [name] is an 66 y.o. male past medical history significant for CAD status post PCI with chronic systolic heart failure by echo on February 2022 with an EF of 40%, permanent atrial fibrillation on aspirin (not on anticoagulation due to preference) COPD on home oxygen, chronic venous stasis who saw his cardiologist in May 2023 was complaining of increased fluid retention and shortness of breath at that time.  We will start her on torsemide and increase twice a day comes into the ED with chronic lower extremity swelling associated with dyspnea on exertion.  He eventually had a mechanical fall 5 days prior to admission which reduces mobility.   Assessment/Plan:   Bilateral cellulitis of lower leg Started empirically on IV vancomycin and Rocephin.   Blood cultures on admission 01/13/2022 were negative. Tmax of 98.6, CBC is pending. Continue IV vancomycin and Rocephin. Wound care was consulted recommended place Xeroform gauze then cover with ABD pad and Kerlix and wrapped on Ace bandages. Surveillance blood cultures 01/19/2022, have remained negative till date.  Acute on chronic systolic heart failure: Estimated dry rate around 102-104 kg. Still appears euvolemic, repeated weight was 109 kg. He has not continues to diurese well, he 16 L negative. Restrict his fluid intake. Bicarbonate continues to trend up. Transition to oral diuretic therapy. BNP is 122.  Ulcer of extremity due to chronic venous insufficiency (HCC) Continue current dressing changes per nurse.  Ambulatory dysfunction Multifactorial related to venous insufficiency and deconditioning in the setting of heart failure and COPD which required oxygen with a recent fall. Physical therapy evaluated the patient recommended skilled nursing facility. Patient still  undecided whether to go to skilled.  Chronic atrial fibrillation: Was seen by cardiology in May 2023. Not interested on anticoagulation currently on aspirin 325 and Lopressor.  Coronary artery disease: Chest pain-free continue aspirin resume statins after discharge.  Left hip pain: X-ray of the hip showed bilateral osteoarthritis left greater than right. Continue tramadol and gabapentin. Continue to work with physical therapy. MRI of the hip showed no evidence of acute fracture severe osteoarthritis of both hip worse on the left, associated with prominent loose body anteriorly on the left, no focal soft tissue abnormalities. Consult orthopedic surgery.  Acute lower back pain: He relates yesterday when he tried to twist he heard a pop and started having back pain. Get an MRI of the lumbar spine. He relates his pain radiates down his leg.  DVT prophylaxis: lovenox Family Communication:none Status is: Inpatient Remains inpatient appropriate because: Cellulitis will need skilled nursing facility placement.    Code Status:     Code Status Orders  (From admission, onward)           Start     Ordered   01/13/22 2115  Full code  Continuous        01/13/22 2117           Code Status History     Date Active Date Inactive Code Status Order ID Comments User Context   08/14/2017 0126 08/19/2017 1610 Full Code [id number]  Cammy Copa, MD Inpatient         IV Access:   Peripheral IV   Procedures and diagnostic studies:   MR HIP LEFT W WO CONTRAST  Result Date: 01/19/2022 CLINICAL DATA:  Left hip pain. Worsening chronic left  lower extremity swelling with recent fall. History of heart failure. EXAM: MRI OF THE LEFT HIP WITHOUT AND WITH CONTRAST TECHNIQUE: Multiplanar, multisequence MR imaging was performed both before and after administration of intravenous contrast. CONTRAST:  37mL GADAVIST GADOBUTROL 1 MMOL/ML IV SOLN COMPARISON:  Left hip radiographs 01/17/2022.  Abdominal CT 03/25/2016. FINDINGS: Despite efforts by the technologist and patient, mild-to-moderate motion artifact is present on today's exam and could not be eliminated. This reduces exam sensitivity and specificity. Bones: There is no evidence of acute fracture, dislocation or femoral head osteonecrosis. As demonstrated radiographically, there is severe osteoarthritis of both hips, worse on the left. The visualized sacroiliac joints and symphysis pubis appear normal. There is a nonacute superior endplate compression deformity at L4 without associated bone marrow edema. Articular cartilage and labrum Articular cartilage: Severe osteoarthritis of both hips, worse on the left. There are prominent subchondral cysts within the left acetabulum. Labrum: Bilateral acetabular labral degeneration. Joint or bursal effusion Joint effusion: No significant knee joint effusion. As suggested radiographically, there is loose body anteriorly in the left hip measuring up to 1.9 cm on image 20/5. Bursae: No periarticular fluid collection or suspicious soft tissue enhancement. Muscles and tendons Muscles and tendons: Mild generalized muscular atrophy. No suspicious muscular enhancement. The gluteus, hamstring and iliopsoas tendons appear intact. Other findings Miscellaneous: Small right inguinal hernia containing only fat. The visualized internal pelvic contents otherwise appear unremarkable. IMPRESSION: 1. No evidence of acute fracture or dislocation. 2. Severe osteoarthritis of both hips, worse on the left. Associated prominent loose body anteriorly on the left. 3. No focal soft tissue abnormalities or abnormal enhancement. Generalized muscular atrophy. Electronically Signed   By: Carey Bullocks M.D.   On: 01/19/2022 08:02     Medical Consultants:   None.   Subjective:    Erik [name] doing of new back pain worsens yesterday transient.  Objective:    Vitals:   01/19/22 1732 01/19/22 1948 01/20/22 0500 01/20/22  0506  BP:  122/79  129/77  Pulse: (!) 101 (!) 104  75  Resp: 16 18  20   Temp:  97.7 F (36.5 C)  98 F (36.7 C)  TempSrc:  Oral    SpO2: 94% 96%  95%  Weight:   109.4 kg   Height:       SpO2: 95 % O2 Flow Rate (L/min): 3 L/min   Intake/Output Summary (Last 24 hours) at 01/20/2022 0655 Last data filed at 01/19/2022 2200 Gross per 24 hour  Intake 880 ml  Output 1325 ml  Net -445 ml    Filed Weights   01/18/22 0500 01/19/22 0500 01/20/22 0500  Weight: 105.2 kg 109.9 kg 109.4 kg    Exam: General exam: In no acute distress. Respiratory system: Good air movement and clear to auscultation. Cardiovascular system: S1 & S2 heard, RRR. No JVD. Gastrointestinal system: Abdomen is nondistended, soft and nontender.  Extremities: No pedal edema. Skin: No rashes, lesions or ulcers Psychiatry: Judgement and insight appear normal. Mood & affect appropriate.   Data Reviewed:    Labs: Basic Metabolic Panel: Recent Labs  Lab 01/15/22 0606 01/16/22 0419 01/17/22 0417 01/18/22 0552 01/19/22 0431 01/20/22 0450  NA 136 137 137 134* 131* 135  K 4.1 3.9 4.0 4.1 4.0 4.2  CL 95* 98 96* 92* 90* 93*  CO2 34* 33* 34* 37* 32 35*  GLUCOSE 94 123* 107* 99 177* 106*  BUN 24* 19 14 16 22 18   CREATININE 0.71 0.70 0.82 0.71 0.82 0.65  CALCIUM 9.1 8.8* 8.7* 8.7* 9.0 9.0  MG 2.1 2.1 2.1 2.0 2.0  --     GFR Estimated Creatinine Clearance: 111 mL/min (by C-G formula based on SCr of 0.65 mg/dL). Liver Function Tests: Recent Labs  Lab 01/13/22 1607  AST 29  ALT 17  ALKPHOS 64  BILITOT 1.1  PROT 6.2*  ALBUMIN 3.2*    No results for input(s): "LIPASE", "AMYLASE" in the last 168 hours. No results for input(s): "AMMONIA" in the last 168 hours. Coagulation profile No results for input(s): "INR", "PROTIME" in the last 168 hours. COVID-19 Labs  No results for input(s): "DDIMER", "FERRITIN", "LDH", "CRP" in the last 72 hours.  No results found for: "SARSCOV2NAA"  CBC: Recent Labs   Lab 01/13/22 1607 01/15/22 0606 01/16/22 0419 01/17/22 0417 01/18/22 0552 01/19/22 0431  WBC 10.5 8.6 10.7* 13.7* 14.6* 16.4*  NEUTROABS 7.0  --   --   --   --   --   HGB 13.8 13.3 12.7* 13.7 13.7 14.2  HCT 44.0 42.5 39.1 42.9 42.5 44.1  MCV 95.7 94.9 92.7 94.5 94.0 92.6  PLT 363 358 358 399 369 433*    Cardiac Enzymes: No results for input(s): "CKTOTAL", "CKMB", "CKMBINDEX", "TROPONINI" in the last 168 hours. BNP (last 3 results) No results for input(s): "PROBNP" in the last 8760 hours. CBG: No results for input(s): "GLUCAP" in the last 168 hours. D-Dimer: No results for input(s): "DDIMER" in the last 72 hours. Hgb A1c: No results for input(s): "HGBA1C" in the last 72 hours. Lipid Profile: No results for input(s): "CHOL", "HDL", "LDLCALC", "TRIG", "CHOLHDL", "LDLDIRECT" in the last 72 hours. Thyroid function studies: No results for input(s): "TSH", "T4TOTAL", "T3FREE", "THYROIDAB" in the last 72 hours.  Invalid input(s): "FREET3" Anemia work up: No results for input(s): "VITAMINB12", "FOLATE", "FERRITIN", "TIBC", "IRON", "RETICCTPCT" in the last 72 hours. Sepsis Labs: Recent Labs  Lab 01/13/22 1607 01/13/22 1951 01/15/22 0606 01/16/22 0419 01/17/22 0417 01/18/22 0552 01/19/22 0431  WBC 10.5  --    < > 10.7* 13.7* 14.6* 16.4*  LATICACIDVEN 1.6 1.4  --   --   --   --   --    < > = values in this interval not displayed.    Microbiology Recent Results (from the past 240 hour(s))  Blood culture (routine x 2)     Status: None   Collection Time: 01/13/22  7:52 PM   Specimen: BLOOD  Result Value Ref Range Status   Specimen Description BLOOD RIGHT [facility]  Final   Special Requests   Final    BOTTLES DRAWN AEROBIC AND ANAEROBIC Blood Culture adequate volume   Culture   Final    NO GROWTH 5 DAYS Performed at [facility], [address], [place], [place] [address]    Report Status 01/18/2022 FINAL  Final  Blood culture (routine x 2)     Status: None    Collection Time: 01/13/22  7:53 PM   Specimen: BLOOD  Result Value Ref Range Status   Specimen Description BLOOD LEFT ANTECUBITAL  Final   Special Requests   Final    BOTTLES DRAWN AEROBIC AND ANAEROBIC Blood Culture results may not be optimal due to an inadequate volume of blood received in culture bottles   Culture   Final    NO GROWTH 5 DAYS Performed at [facility], [address], [place], [place] [address]    Report Status 01/18/2022 FINAL  Final  Aerobic Culture w Gram Stain (superficial  specimen)     Status: None (Preliminary result)   Collection Time: 01/17/22 10:00 AM   Specimen: Wound  Result Value Ref Range Status   Specimen Description   Final    WOUND Performed at [facility], [address] [place], [place] [address]    Special Requests   Final    LEFT LEG Performed at [facility], [address] [place], [place] [address]    Gram Stain   Final    NO WBC SEEN RARE Romie Minus NEGATIVE RODS RARE GRAM POSITIVE RODS    Culture   Final    CULTURE REINCUBATED FOR BETTER GROWTH Performed at [facility], [address], [place], [place] [address]    Report Status PENDING  Incomplete     Medications:    aspirin EC  325 mg Oral Daily   docusate sodium  100 mg Oral Daily   enoxaparin (LOVENOX) injection  0.5 mg/kg Subcutaneous Q24H   fluticasone furoate-vilanterol  1 puff Inhalation Daily   gabapentin  300 mg Oral TID   metoprolol tartrate  25 mg Oral BID   polyethylene glycol  17 g Oral Daily   rosuvastatin  10 mg Oral Daily   saccharomyces boulardii  250 mg Oral BID   torsemide  10 mg Oral Daily   umeclidinium bromide  1 puff Inhalation Daily   Continuous Infusions:  cefTRIAXone (ROCEPHIN)  IV 2 g (01/19/22 0919)   vancomycin        LOS: 7 days   Marinda Elk  [facility]  01/20/2022, 6:55 AM

## 2022-01-20 NOTE — Progress Notes (Signed)
Physical Therapy Treatment Patient Details Name: Erik [name] MRN: [id number] DOB: Dec 10, 1955 Today's Date: 01/20/2022   History of Present Illness Erik [name] is a 66 y.o. male  with h/o HFrEF, COPD, HTN, CAD s/p MI, here with leg pain. Reports over the past week, he's had increasing bilateral, R>L swelling. R leg has gotten more painful and red, swollen as well. He has also had worsening cough, orthopnea, sputum production. Has had difficulty getting around due to his leg swelling and pain.    PT Comments    Pt received supine in bed resting on 3 L/min via [place]. PT reporting continuous L hip pain and now lumbar pain reporting he just came back from MRI for his lumbar spine.  Pt agreeable to PT session. Pt still requiring physical assist, increased time, and HOB elevated to reach EOB with VC's for sequencing of limbs. With bed greatly elevated, pt able to stand to RW and ambulate ~2' to recliner after ~3 minutes seated rest at EOB. Pt demonstrating fair stability with RW but relies on heavy UE use and small steps with decreased stance time on LLE to successfully navigate to sitting in recliner. Pt desat on 3L/min to 87-88% requiring titration to 4L/min to maintain 89-90% with education on PLB. Education provided on equipment needs if pt were to d/c home versus SNF. Pt also educated on chronic pain science education o fear avoidant behaviors and how they can lead to further pain, deconditioning, and further loss of independence. Pt verbalizing all education sitting upright in recliner with all needs in reach. RN made aware of pt positioning. D/c recs remain appropriate as pt is still below baseline mobility with inability to ambulate household distances at this time. PT to heavily encourage and recommend STR at discharge to address pt's deficits, improve pt independence and reduce caregiver burden at home.    Recommendations for follow up therapy are one component of a multi-disciplinary discharge  planning process, led by the attending physician.  Recommendations may be updated based on patient status, additional functional criteria and insurance authorization.  Follow Up Recommendations  Skilled nursing-short term rehab (<3 hours/day)     Assistance Recommended at Discharge Intermittent Supervision/Assistance  Patient can return home with the following A lot of help with walking and/or transfers;A lot of help with bathing/dressing/bathroom;Assist for transportation;Help with stairs or ramp for entrance;Assistance with cooking/housework   Equipment Recommendations  Other (comment);Wheelchair (measurements PT);Wheelchair cushion (measurements PT);Hospital bed (if pt continues to refuse SNF)    Recommendations for Other Services       Precautions / Restrictions Precautions Precautions: Fall Restrictions Weight Bearing Restrictions: No     Mobility  Bed Mobility Overal bed mobility: Needs Assistance Bed Mobility: Supine to Sit     Supine to sit: HOB elevated, Mod assist     General bed mobility comments: Pt requiring increased time, VC's for sequencing and modA at torso to sit EOB Patient Response: Cooperative  Transfers Overall transfer level: Needs assistance Equipment used: Rolling walker (2 wheels) Transfers: Sit to/from Stand Sit to Stand: Min guard, From elevated surface                Ambulation/Gait Ambulation/Gait assistance: Min guard Gait Distance (Feet): 2 Feet Assistive device: Rolling walker (2 wheels) Gait Pattern/deviations: Step-to pattern, Decreased stance time - left       General Gait Details: ambulating at bedside to [employer]  Mobility    Modified Rankin (Stroke Patients Only)       Balance Overall balance assessment: Needs assistance Sitting-balance support: Feet supported Sitting balance-Leahy Scale: Fair     Standing balance support: During functional activity, Reliant on  assistive device for balance Standing balance-Leahy Scale: Fair Standing balance comment: maintaining static standing with RW                            Cognition Arousal/Alertness: Awake/alert Behavior During Therapy: WFL for tasks assessed/performed Overall Cognitive Status: Within Functional Limits for tasks assessed                                          Exercises Other Exercises Other Exercises: Pain science education on fear avoidant behaviors    General Comments General comments (skin integrity, edema, etc.): Desat to 88% on 3 L/min at rest and with activity. Requiring titration to 4 L/min to maintain 89-90%      Pertinent Vitals/Pain Pain Assessment Pain Assessment: Faces Faces Pain Scale: Hurts little more Pain Location: left thigh/outer leg, lower back with movement Pain Descriptors / Indicators: Discomfort, Grimacing, Guarding, Moaning, Pins and needles, Stabbing Pain Intervention(s): Limited activity within patient's tolerance, Monitored during session, Repositioned    Home Living                          Prior Function            PT Goals (current goals can now be found in the care plan section) Acute Rehab PT Goals Patient Stated Goal: decreased pain and to go home PT Goal Formulation: With patient Time For Goal Achievement: 01/28/22 Potential to Achieve Goals: Fair Progress towards PT goals: Progressing toward goals    Frequency    Min 2X/week      PT Plan Current plan remains appropriate    Co-evaluation              AM-PAC PT "6 Clicks" Mobility   Outcome Measure  Help needed turning from your back to your side while in a flat bed without using bedrails?: A Lot Help needed moving from lying on your back to sitting on the side of a flat bed without using bedrails?: A Lot Help needed moving to and from a bed to a chair (including a wheelchair)?: A Lot Help needed standing up from a chair using  your arms (e.g., wheelchair or bedside chair)?: A Lot Help needed to walk in hospital room?: Total Help needed climbing 3-5 steps with a railing? : Total 6 Click Score: 10    End of Session Equipment Utilized During Treatment: Gait belt;Oxygen Activity Tolerance: Patient limited by pain Patient left: in chair Nurse Communication: Mobility status PT Visit Diagnosis: Unsteadiness on feet (R26.81);Other abnormalities of gait and mobility (R26.89);Muscle weakness (generalized) (M62.81);History of falling (Z91.81)     Time: 6948-5462 PT Time Calculation (min) (ACUTE ONLY): 25 min  Charges:  $Therapeutic Activity: 8-22 mins $Self Care/Home Management: 8-22                    Delphia Grates. Fairly IV, PT, DPT Physical Therapist- [place]  [facility]  01/20/2022, 2:58 PM

## 2022-01-21 ENCOUNTER — Ambulatory Visit: Payer: Medicare Other | Admitting: Medical

## 2022-01-21 DIAGNOSIS — I2510 Atherosclerotic heart disease of native coronary artery without angina pectoris: Secondary | ICD-10-CM | POA: Diagnosis not present

## 2022-01-21 DIAGNOSIS — L03116 Cellulitis of left lower limb: Secondary | ICD-10-CM | POA: Diagnosis not present

## 2022-01-21 DIAGNOSIS — I482 Chronic atrial fibrillation, unspecified: Secondary | ICD-10-CM | POA: Diagnosis not present

## 2022-01-21 DIAGNOSIS — J9611 Chronic respiratory failure with hypoxia: Secondary | ICD-10-CM | POA: Diagnosis not present

## 2022-01-21 LAB — BASIC METABOLIC PANEL
Anion gap: 9 (ref 5–15)
BUN: 21 mg/dL (ref 8–23)
CO2: 33 mmol/L — ABNORMAL HIGH (ref 22–32)
Calcium: 8.7 mg/dL — ABNORMAL LOW (ref 8.9–10.3)
Chloride: 91 mmol/L — ABNORMAL LOW (ref 98–111)
Creatinine, Ser: 0.67 mg/dL (ref 0.61–1.24)
GFR, Estimated: >60 mL/min (ref >60)
Glucose, Bld: 90 mg/dL (ref 70–99)
Potassium: 4 mmol/L (ref 3.5–5.1)
Sodium: 133 mmol/L — ABNORMAL LOW (ref 135–145)

## 2022-01-21 LAB — CBC
HCT: 40.8 % (ref 39.0–52.0)
Hemoglobin: 13.1 g/dL (ref 13.0–17.0)
MCH: 29.4 pg (ref 26.0–34.0)
MCHC: 32.1 g/dL (ref 30.0–36.0)
MCV: 91.5 fL (ref 80.0–100.0)
Platelets: 410 10*3/uL — ABNORMAL HIGH (ref 150–400)
RBC: 4.46 MIL/uL (ref 4.22–5.81)
RDW: 12.4 % (ref 11.5–15.5)
WBC: 11.8 10*3/uL — ABNORMAL HIGH (ref 4.0–10.5)
nRBC: 0 % (ref 0.0–0.2)

## 2022-01-21 MED ORDER — MORPHINE SULFATE (PF) 4 MG/ML IV SOLN: 4.0000 mg | INTRAVENOUS | Status: DC | PRN

## 2022-01-21 MED ORDER — TORSEMIDE 20 MG PO TABS: 10.0000 mg | ORAL_TABLET | Freq: Every day | ORAL | Status: DC

## 2022-01-21 MED ORDER — HYDROCODONE-ACETAMINOPHEN 5-325 MG PO TABS
1.0000 | ORAL_TABLET | ORAL | Status: DC | PRN
  Administered 2022-01-21: 1 via ORAL
  Administered 2022-01-21 – 2022-01-25 (×9): 2 via ORAL
  Filled 2022-01-21 (×4): qty 2
  Filled 2022-01-21: qty 1
  Filled 2022-01-21 (×5): qty 2

## 2022-01-21 MED ORDER — LOSARTAN POTASSIUM 25 MG PO TABS
25.0000 mg | ORAL_TABLET | Freq: Every day | ORAL | Status: DC
  Administered 2022-01-21 – 2022-01-25 (×2): 25 mg via ORAL
  Filled 2022-01-21 (×4): qty 1

## 2022-01-21 MED ORDER — ENOXAPARIN SODIUM 60 MG/0.6ML IJ SOSY
0.5000 mg/kg | PREFILLED_SYRINGE | INTRAMUSCULAR | Status: DC
  Administered 2022-01-21 – 2022-01-24 (×4): 55 mg via SUBCUTANEOUS
  Filled 2022-01-21 (×4): qty 0.6

## 2022-01-21 NOTE — Progress Notes (Signed)
[facility] PROGRESS NOTE    Progress Note  Erik [name]  OIN:[id number] DOB: 29-May-1956 DOA: 01/13/2022 PCP: Sallyanne Kuster, NP     Brief Narrative:   Erik [name] is an 66 y.o. male past medical history significant for CAD status post PCI with chronic systolic heart failure by echo on February 2022 with an EF of 40%, permanent atrial fibrillation on aspirin (not on anticoagulation due to preference) COPD on home oxygen, chronic venous stasis who saw his cardiologist in May 2023 was complaining of increased fluid retention and shortness of breath at that time.  We will start her on torsemide and increase twice a day comes into the ED with chronic lower extremity swelling associated with dyspnea on exertion.  He eventually had a mechanical fall 5 days prior to admission which reduces mobility.   Assessment/Plan:   Bilateral cellulitis of lower leg Started empirically on IV vancomycin and Rocephin.   Blood cultures on admission 01/13/2022 were negative. Tmax 99.5, his leukocytosis improved. Continue IV vancomycin and Rocephin. Blood cultures on 01/13/2022 remain negative to date. Surveillance blood culture 01/19/2022 remain negative  Acute on chronic systolic heart failure: His estimated dry weight now in the hospital is around 109 kg. He is negative about 16 L. Liberalize his diet. Strict I's and O's. Continue to hold oral diuretic therapy resume Cozaar. His BNP was 122.  Ulcer of extremity due to chronic venous insufficiency (HCC) Continue current dressing changes per nurse.  Ambulatory dysfunction Multifactorial related to venous insufficiency and deconditioning in the setting of heart failure and COPD which required oxygen with a recent fall. Physical therapy evaluated the patient recommended skilled nursing facility. Patient still undecided whether to go to skilled.  Chronic atrial fibrillation: Was seen by cardiology in May 2023. Not interested on  anticoagulation currently on aspirin 325 and Lopressor.  Coronary artery disease: Chest pain-free continue aspirin resume statins after discharge.  Left hip pain: X-ray of the hip showed bilateral osteoarthritis left greater than right. Continue tramadol and gabapentin. MRI of the hip showed no evidence of acute fracture severe osteoarthritis of both hip worse on the left, associated with prominent loose body anteriorly on the left, no focal soft tissue abnormalities. Awaiting orthopedic surgery recommendations.  Acute/subacute compression over to vertebral body approximately 30% height loss, MRI was done that showed compression fraction of L2 no retropulsion. Chronic compression of L4.  DVT prophylaxis: lovenox Family Communication:none Status is: Inpatient Remains inpatient appropriate because: Cellulitis will need skilled nursing facility placement.    Code Status:     Code Status Orders  (From admission, onward)           Start     Ordered   01/13/22 2115  Full code  Continuous        01/13/22 2117           Code Status History     Date Active Date Inactive Code Status Order ID Comments User Context   08/14/2017 0126 08/19/2017 1610 Full Code [id number]  Cammy Copa, MD Inpatient         IV Access:   Peripheral IV   Procedures and diagnostic studies:   MR LUMBAR SPINE WO CONTRAST  Result Date: 01/20/2022 CLINICAL DATA:  Low back pain, chronic. EXAM: MRI LUMBAR SPINE WITHOUT CONTRAST TECHNIQUE: Multiplanar, multisequence MR imaging of the lumbar spine was performed. No intravenous contrast was administered. COMPARISON:  None Available. FINDINGS: Segmentation:  Standard. Alignment:  Physiologic. Vertebrae: Compression fracture of the L2 superior  endplate resulting in loss of approximately 30% vertebral body height with associated marrow edema, consistent with acute/subacute fracture. There is no retropulsion. Chronic compression fracture of the L4 vertebral  body with approximately 50% height loss height loss and no significant retropulsion. No evidence of discitis or aggressive bone lesion. Conus medullaris and cauda equina: Conus extends to the L1-2 level. Conus and cauda equina appear normal. Paraspinal and other soft tissues: Negative. Disc levels: T12-L1: No spinal canal or neural foraminal stenosis. L1-2: No spinal canal or neural foraminal stenosis. L2-3: Mild facet degenerative changes. No spinal canal or neural foraminal stenosis. L3-4: Disc bulge and mild facet degenerative changes without significant spinal canal or neural foraminal stenosis. L4-5: Shallow disc bulge and mild facet degenerative changes without significant spinal canal or neural foraminal stenosis. L5-S1: Mild facet degenerative changes. No spinal canal or neural foraminal stenosis. IMPRESSION: 1. Acute/subacute compression fracture of the L2 vertebral body with approximately 30% height loss. No retropulsion. 2. Chronic compression fracture of the L4 vertebral body resulting in approximately 50% height loss without retropulsion. 3. Mild degenerative changes of the lumbar spine without high-grade spinal canal or neural foraminal stenosis at any level. Electronically Signed   By: Baldemar Lenis M.D.   On: 01/20/2022 14:08     Medical Consultants:   None.   Subjective:    Erik [name] continues to have pain.  Objective:    Vitals:   01/21/22 0500 01/21/22 0506 01/21/22 0541 01/21/22 0801  BP:  (!) 145/90  134/89  Pulse:  91 (!) 102 (!) 104  Resp:  20  20  Temp:  98 F (36.7 C)  98.5 F (36.9 C)  TempSrc:  Oral  Oral  SpO2:  92% 94% 95%  Weight: 109.7 kg     Height:       SpO2: 95 % O2 Flow Rate (L/min): 4 L/min   Intake/Output Summary (Last 24 hours) at 01/21/2022 0824 Last data filed at 01/21/2022 0615 Gross per 24 hour  Intake 720 ml  Output 1000 ml  Net -280 ml    Filed Weights   01/19/22 0500 01/20/22 0500 01/21/22 0500  Weight: 109.9  kg 109.4 kg 109.7 kg    Exam: General exam: In no acute distress. Respiratory system: Good air movement and clear to auscultation. Cardiovascular system: S1 & S2 heard, RRR. No JVD. Gastrointestinal system: Abdomen is nondistended, soft and nontender.  Extremities: No pedal edema. Skin: No rashes, lesions or ulcers Psychiatry: Judgement and insight appear normal. Mood & affect appropriate.   Data Reviewed:    Labs: Basic Metabolic Panel: Recent Labs  Lab 01/15/22 0606 01/16/22 0419 01/17/22 0417 01/18/22 0552 01/19/22 0431 01/20/22 0450 01/21/22 0629  NA 136 137 137 134* 131* 135 133*  K 4.1 3.9 4.0 4.1 4.0 4.2 4.0  CL 95* 98 96* 92* 90* 93* 91*  CO2 34* 33* 34* 37* 32 35* 33*  GLUCOSE 94 123* 107* 99 177* 106* 90  BUN 24* 19 14 16 22 18 21   CREATININE 0.71 0.70 0.82 0.71 0.82 0.65 0.67  CALCIUM 9.1 8.8* 8.7* 8.7* 9.0 9.0 8.7*  MG 2.1 2.1 2.1 2.0 2.0  --   --     GFR Estimated Creatinine Clearance: 111.1 mL/min (by C-G formula based on SCr of 0.67 mg/dL). Liver Function Tests: No results for input(s): "AST", "ALT", "ALKPHOS", "BILITOT", "PROT", "ALBUMIN" in the last 168 hours.  No results for input(s): "LIPASE", "AMYLASE" in the last 168 hours. No results for  input(s): "AMMONIA" in the last 168 hours. Coagulation profile No results for input(s): "INR", "PROTIME" in the last 168 hours. COVID-19 Labs  No results for input(s): "DDIMER", "FERRITIN", "LDH", "CRP" in the last 72 hours.  No results found for: "SARSCOV2NAA"  CBC: Recent Labs  Lab 01/16/22 0419 01/17/22 0417 01/18/22 0552 01/19/22 0431 01/20/22 0450  WBC 10.7* 13.7* 14.6* 16.4* 11.7*  HGB 12.7* 13.7 13.7 14.2 13.6  HCT 39.1 42.9 42.5 44.1 41.8  MCV 92.7 94.5 94.0 92.6 92.5  PLT 358 399 369 433* 398    Cardiac Enzymes: No results for input(s): "CKTOTAL", "CKMB", "CKMBINDEX", "TROPONINI" in the last 168 hours. BNP (last 3 results) No results for input(s): "PROBNP" in the last 8760  hours. CBG: No results for input(s): "GLUCAP" in the last 168 hours. D-Dimer: No results for input(s): "DDIMER" in the last 72 hours. Hgb A1c: No results for input(s): "HGBA1C" in the last 72 hours. Lipid Profile: No results for input(s): "CHOL", "HDL", "LDLCALC", "TRIG", "CHOLHDL", "LDLDIRECT" in the last 72 hours. Thyroid function studies: No results for input(s): "TSH", "T4TOTAL", "T3FREE", "THYROIDAB" in the last 72 hours.  Invalid input(s): "FREET3" Anemia work up: No results for input(s): "VITAMINB12", "FOLATE", "FERRITIN", "TIBC", "IRON", "RETICCTPCT" in the last 72 hours. Sepsis Labs: Recent Labs  Lab 01/17/22 0417 01/18/22 0552 01/19/22 0431 01/20/22 0450  WBC 13.7* 14.6* 16.4* 11.7*    Microbiology Recent Results (from the past 240 hour(s))  Blood culture (routine x 2)     Status: None   Collection Time: 01/13/22  7:52 PM   Specimen: BLOOD  Result Value Ref Range Status   Specimen Description BLOOD RIGHT [facility]  Final   Special Requests   Final    BOTTLES DRAWN AEROBIC AND ANAEROBIC Blood Culture adequate volume   Culture   Final    NO GROWTH 5 DAYS Performed at [facility], [address], [place], [place] [address]    Report Status 01/18/2022 FINAL  Final  Blood culture (routine x 2)     Status: None   Collection Time: 01/13/22  7:53 PM   Specimen: BLOOD  Result Value Ref Range Status   Specimen Description BLOOD LEFT ANTECUBITAL  Final   Special Requests   Final    BOTTLES DRAWN AEROBIC AND ANAEROBIC Blood Culture results may not be optimal due to an inadequate volume of blood received in culture bottles   Culture   Final    NO GROWTH 5 DAYS Performed at [facility], [address], [place], [place] [address]    Report Status 01/18/2022 FINAL  Final  Aerobic Culture w Gram Stain (superficial specimen)     Status: Abnormal   Collection Time: 01/17/22 10:00 AM   Specimen: Wound  Result Value Ref Range Status   Specimen Description    Final    WOUND Performed at [facility], [address], [place], [place] [address]    Special Requests   Final    LEFT LEG Performed at [facility], [address] [place], [place] [address]    Gram Stain   Final    NO WBC SEEN RARE Romie Minus NEGATIVE RODS RARE GRAM POSITIVE RODS Performed at [facility], [address] [place], [place] [address]    Culture MULTIPLE ORGANISMS PRESENT, NONE PREDOMINANT (A)  Final   Report Status 01/20/2022 FINAL  Final  Culture, blood (Routine X 2) w Reflex to ID Panel     Status: None (Preliminary result)  Collection Time: 01/19/22  7:48 AM   Specimen: BLOOD  Result Value Ref Range Status   Specimen Description BLOOD [facility]  Final   Special Requests   Final    BOTTLES DRAWN AEROBIC AND ANAEROBIC Blood Culture adequate volume   Culture   Final    NO GROWTH 2 DAYS Performed at [facility], [address], [place], [place] [address]    Report Status PENDING  Incomplete  Culture, blood (Routine X 2) w Reflex to ID Panel     Status: None (Preliminary result)   Collection Time: 01/19/22  7:59 AM   Specimen: BLOOD  Result Value Ref Range Status   Specimen Description BLOOD BLOOD RIGHT HAND  Final   Special Requests   Final    BOTTLES DRAWN AEROBIC AND ANAEROBIC Blood Culture results may not be optimal due to an excessive volume of blood received in culture bottles   Culture   Final    NO GROWTH 2 DAYS Performed at [facility], [address], [place], [place] [address]    Report Status PENDING  Incomplete     Medications:    aspirin EC  325 mg Oral Daily   docusate sodium  100 mg Oral Daily   enoxaparin (LOVENOX) injection  0.5 mg/kg Subcutaneous Q24H   fluticasone furoate-vilanterol  1 puff Inhalation Daily   gabapentin  300 mg Oral TID   metoprolol tartrate  25 mg Oral BID   polyethylene glycol  17 g Oral Daily   rosuvastatin  10 mg Oral Daily   saccharomyces boulardii  250 mg Oral BID    torsemide  10 mg Oral Daily   umeclidinium bromide  1 puff Inhalation Daily   Continuous Infusions:  cefTRIAXone (ROCEPHIN)  IV 2 g (01/20/22 0907)   vancomycin 1,500 mg (01/20/22 1120)      LOS: 8 days   Marinda Elk  [facility]  01/21/2022, 8:24 AM

## 2022-01-21 NOTE — Consult Note (Signed)
Pharmacy Antibiotic Note  Erik [name] is a 66 y.o. male admitted on 01/13/2022 with cellulitis.  Pharmacy has been consulted for vancomycin dosing.  -also on Ceftriaxone currently  Plan: Vancomycin 1500mg  IV every 24 hours Goal AUC 400-550  Est AUC: 490.0 Est Cmax: 37.0  Est Cmin: 10.6 Calculated with SCr 0.82  F/u renal fxn, cultures,  on torsemide   Height: 5\' 10"  (177.8 cm) Weight: 109.7 kg (241 lb 13.5 oz) IBW/kg (Calculated) : 73  Temp (24hrs), Avg:98 F (36.7 C), Min:97.5 F (36.4 C), Max:98.5 F (36.9 C)  Recent Labs  Lab 01/17/22 0417 01/18/22 0552 01/19/22 0431 01/20/22 0450 01/21/22 0629  WBC 13.7* 14.6* 16.4* 11.7* 11.8*  CREATININE 0.82 0.71 0.82 0.65 0.67     Estimated Creatinine Clearance: 111.1 mL/min (by C-G formula based on SCr of 0.67 mg/dL).    Allergies  Allergen Reactions   Benadryl [Diphenhydramine Hcl] Shortness Of Breath   Morphine And Related Other (See Comments)    Difficulty breathing per patient     Antimicrobials this admission:  6/8 Vancomycin 2g x1 in ED Ceftriaxone 6/8>> 6/11,   6/14>> Augmentin 6/12>> 6/13 6/14 Vancomycin >>   Microbiology results: 6/8 BCx: NGTD 6/12 Bcx: sent 6/12 Wound cx: Rare GNR + Rare GPR: mult organisms  Thank you for allowing pharmacy to be a part of this patient's care.  Tricia Pledger A 01/21/2022 12:19 PM

## 2022-01-21 NOTE — TOC Progression Note (Signed)
Transition of Care [facility]) - Progression Note    Patient Details  Name: Erik [name] MRN: [id number] Date of Birth: 09/11/1955  [facility]) CM/SW Contact  Liliana Cline, LCSW Phone Number: 01/21/2022, 11:57 AM  Clinical Narrative:   Spoke to patient who is now agreeable to SNF. SNF work up started.     Expected Discharge Plan: Home w Home Health Services Barriers to Discharge: Continued Medical Work up  Expected Discharge Plan and Services Expected Discharge Plan: Home w Home Health Services       Living arrangements for the past 2 months: Single Family Home                                       Social Determinants of Health (SDOH) Interventions    Readmission Risk Interventions     No data to display

## 2022-01-21 NOTE — [place] FL2 (Signed)
[place] MEDICAID FL2 LEVEL OF CARE SCREENING TOOL     IDENTIFICATION  Patient Name: Erik [name] Birthdate: Jun 26, 1956 Sex: male Admission Date (Current Location): 01/13/2022  [facility] and [place] Number:  Chiropodist and Address:  [facility], [address] [place], [place] [address]      Provider Number: [id number]  Attending Physician Name and Address:  Marinda Elk, MD  Relative Name and Phone Number:  Erik [name], Erik [name] (Spouse)   [phone] [facility] Phone)    Current Level of Care: Hospital Recommended Level of Care: Skilled Nursing Facility Prior Approval Number:    Date Approved/Denied:   PASRR Number: [id number]  Discharge Plan: SNF    Current Diagnoses: Patient Active Problem List   Diagnosis Date Noted   Left hip pain 01/17/2022   Ulcer of extremity due to chronic venous insufficiency (HCC) 01/14/2022   Bilateral cellulitis of lower leg 01/13/2022   Ambulatory dysfunction 01/13/2022   Bilateral lower leg cellulitis 01/13/2022   Chronic systolic CHF (congestive heart failure) (HCC) 01/13/2022   Chronic respiratory failure with hypoxia (HCC) 01/13/2022   Atherosclerosis of native arteries of extremity with intermittent claudication (HCC) 06/01/2020   Dorsalgia 09/11/2017   Occlusion and stenosis of bilateral carotid arteries 09/11/2017   Pulmonic heart disease (HCC) 09/11/2017   COPD (chronic obstructive pulmonary disease) (HCC) 08/14/2017   Chronic a-fib (HCC) 03/27/2015   CAD (coronary artery disease) 03/02/2015   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   Essential hypertension with goal blood pressure less than 130/80 02/23/2015    Orientation RESPIRATION BLADDER Height & Weight     Self, Time, Situation, Place  O2 External catheter Weight: 241 lb 13.5 oz (109.7 kg) Height:  5\' 10"  (177.8 cm)  BEHAVIORAL SYMPTOMS/MOOD NEUROLOGICAL BOWEL NUTRITION STATUS      Continent Diet (dys 2)  AMBULATORY STATUS  COMMUNICATION OF NEEDS Skin   Limited Assist Verbally Other (Comment) (venous stasis ulcer tibia)                       Personal Care Assistance Level of Assistance  Bathing, Feeding, Dressing Bathing Assistance: Limited assistance Feeding assistance: Independent Dressing Assistance: Limited assistance Total Care Assistance: Limited assistance   Functional Limitations Info             SPECIAL CARE FACTORS FREQUENCY  PT (By licensed PT), OT (By licensed OT)     PT Frequency: 5 times per week OT Frequency: 5 times per week            Contractures Contractures Info: Not present    Additional Factors Info  Code Status, Allergies Code Status Info: full Allergies Info: Benadryl (Diphenhydramine Hcl), Morphine and Related           Current Medications (01/21/2022):  This is the current hospital active medication list Current Facility-Administered Medications  Medication Dose Route Frequency Provider Last Rate Last Admin   acetaminophen (TYLENOL) tablet 1,000 mg  1,000 mg Oral TID PRN 01/23/2022, MD   1,000 mg at 01/20/22 0858   alum & mag hydroxide-simeth (MAALOX/MYLANTA) 200-200-20 MG/5ML suspension 30 mL  30 mL Oral Q6H PRN 02-14-2001, MD       aspirin EC tablet 325 mg  325 mg Oral Daily Darlin Priestly V, MD   325 mg at 01/21/22 0933   cefTRIAXone (ROCEPHIN) 2 g in sodium chloride 0.9 % 100 mL IVPB  2 g Intravenous Q24H 01/23/22, MD 200 mL/hr at 01/21/22 0938 2 g  at 01/21/22 0938   docusate sodium (COLACE) capsule 100 mg  100 mg Oral Daily Darlin Priestly, MD   100 mg at 01/21/22 0933   enoxaparin (LOVENOX) injection 52.5 mg  0.5 mg/kg Subcutaneous Q24H Lowella Bandy, RPH   52.5 mg at 01/20/22 2017   fluticasone furoate-vilanterol (BREO ELLIPTA) 100-25 MCG/ACT 1 puff  1 puff Inhalation Daily Darlin Priestly, MD   1 puff at 01/21/22 1123   gabapentin (NEURONTIN) capsule 300 mg  300 mg Oral TID Darlin Priestly, MD   300 mg at 01/21/22 0930   HYDROcodone-acetaminophen  (NORCO/VICODIN) 5-325 MG per tablet 1-2 tablet  1-2 tablet Oral Q4H PRN Marinda Elk, MD       ipratropium-albuterol (DUONEB) 0.5-2.5 (3) MG/3ML nebulizer solution 3 mL  3 mL Nebulization Q4H PRN Andris Baumann, MD   3 mL at 01/21/22 0803   losartan (COZAAR) tablet 25 mg  25 mg Oral Daily Marinda Elk, MD   25 mg at 01/21/22 0933   metoprolol tartrate (LOPRESSOR) tablet 25 mg  25 mg Oral BID Lindajo Royal V, MD   25 mg at 01/21/22 0930   ondansetron (ZOFRAN) tablet 4 mg  4 mg Oral Q6H PRN Andris Baumann, MD   4 mg at 01/15/22 0831   Or   ondansetron (ZOFRAN) injection 4 mg  4 mg Intravenous Q6H PRN Andris Baumann, MD       polyethylene glycol (MIRALAX / GLYCOLAX) packet 17 g  17 g Oral Daily Marinda Elk, MD   17 g at 01/20/22 0859   rosuvastatin (CRESTOR) tablet 10 mg  10 mg Oral Daily Marinda Elk, MD   10 mg at 01/21/22 0017   saccharomyces boulardii (FLORASTOR) capsule 250 mg  250 mg Oral BID Darlin Priestly, MD   250 mg at 01/21/22 0930   torsemide (DEMADEX) tablet 10 mg  10 mg Oral Daily Marinda Elk, MD   10 mg at 01/21/22 0933   traMADol (ULTRAM) tablet 100 mg  100 mg Oral Q6H PRN Marinda Elk, MD   100 mg at 01/21/22 0918   umeclidinium bromide (INCRUSE ELLIPTA) 62.5 MCG/ACT 1 puff  1 puff Inhalation Daily Darlin Priestly, MD   1 puff at 01/21/22 0937   vancomycin (VANCOREADY) IVPB 1500 mg/300 mL  1,500 mg Intravenous Q24H Selinda Eon, RPH 150 mL/hr at 01/21/22 1122 1,500 mg at 01/21/22 1122     Discharge Medications: Please see discharge summary for a list of discharge medications.  Relevant Imaging Results:  Relevant Lab Results:   Additional Information SS# [id number]  Erik [name] E Erik Kister, LCSW

## 2022-01-21 NOTE — Progress Notes (Signed)
Physical Therapy Treatment Patient Details Name: Erik [name] MRN: [id number] DOB: [birth year] Today's Date: 01/21/2022   History of Present Illness Erik [name] is a 66 y.o. male  with h/o HFrEF, COPD, HTN, CAD s/p MI, here with leg pain. Reports over the past week, he's had increasing bilateral, R>L swelling. R leg has gotten more painful and red, swollen as well. He has also had worsening cough, orthopnea, sputum production. Has had difficulty getting around due to his leg swelling and pain.    PT Comments    Pt was long sitting in bed upon arriving on 4 L o2. Slightly SOB however agreeable to session with encouragement. Pt gets SOB/anxious with anticipation of movements and during movements. Constant relaxation cues required and extensive time to perform all desired task requested of him. He required mod assist to achieve EOB short sit.C/O 7/10 pain in L knee-thigh region. Once in standing, he endorses only 4/10 but is SOB. Again needs relaxation techniques to control SOB. Pt was able to advance to taking a few labored steps to recliner. Pt will need extensive PT to maximize independence while decreasing caregiver burden. Acute PT will continue to follow and progress as able per current POC.    Recommendations for follow up therapy are one component of a multi-disciplinary discharge planning process, led by the attending physician.  Recommendations may be updated based on patient status, additional functional criteria and insurance authorization.  Follow Up Recommendations  Skilled nursing-short term rehab (<3 hours/day)     Assistance Recommended at Discharge Frequent or constant Supervision/Assistance  Patient can return home with the following A lot of help with walking and/or transfers;A lot of help with bathing/dressing/bathroom;Assist for transportation;Help with stairs or ramp for entrance;Assistance with cooking/housework   Equipment Recommendations  None recommended by PT (pt  states he has w/c and RW at home already. defer equipment recs to next level of care)       Precautions / Restrictions Precautions Precautions: Fall Restrictions Weight Bearing Restrictions: No     Mobility  Bed Mobility Overal bed mobility: Needs Assistance Bed Mobility: Supine to Sit     Supine to sit: Mod assist (flat bed)     General bed mobility comments: Pt required extensive time to complete supine > sit via log roll technique    Transfers Overall transfer level: Needs assistance Equipment used: Rolling walker (2 wheels) Transfers: Sit to/from Stand Sit to Stand: Min guard, From elevated surface           General transfer comment: pt required extensive ime due to anxiety priro to standing. Vcs for technqiue and sequencing improvements. pt has fear of falling once in standing    Ambulation/Gait Ambulation/Gait assistance: Min guard Gait Distance (Feet): 3 Feet Assistive device: Rolling walker (2 wheels) Gait Pattern/deviations: Step-to pattern Gait velocity: decreased!     General Gait Details: Pt was able to take steps to recliner. Constant encouragement + extensive time required. Pt does have some SOB but author feels due to anxiety. HR and sao2 stable throughout session with pt on 4 L o2 [place].    Balance Overall balance assessment: Needs assistance Sitting-balance support: Feet supported Sitting balance-Leahy Scale: Good Sitting balance - Comments: pt sat EOB x > due anxiousness   Standing balance support: During functional activity, Reliant on assistive device for balance Standing balance-Leahy Scale: Fair    Cognition Arousal/Alertness: Awake/alert Behavior During Therapy: Anxious Overall Cognitive Status: No family/caregiver present to determine baseline cognitive functioning  General Comments: Pt is very anxious throughout session. He is alert and oriented but need extensive time to perform all desired task requested of him.            General Comments General comments (skin integrity, edema, etc.): Pt c/o severe pain throughout. 7/10. He gets worked up and needs constant relaxation techniques to slow rate of breathing and stay focused on desired task at hand.      Pertinent Vitals/Pain Pain Assessment Pain Assessment: 0-10 Pain Score: 7  Faces Pain Scale: Hurts little more Pain Location: L knee to hip and LBP Pain Descriptors / Indicators: Discomfort, Grimacing, Guarding, Moaning, Pins and needles, Stabbing Pain Intervention(s): Limited activity within patient's tolerance, Monitored during session, Premedicated before session, Repositioned     PT Goals (current goals can now be found in the care plan section) Acute Rehab PT Goals Patient Stated Goal: less pain with better breathing Progress towards PT goals: Progressing toward goals    Frequency    Min 2X/week      PT Plan Current plan remains appropriate       AM-PAC PT "6 Clicks" Mobility   Outcome Measure  Help needed turning from your back to your side while in a flat bed without using bedrails?: A Lot Help needed moving from lying on your back to sitting on the side of a flat bed without using bedrails?: A Lot Help needed moving to and from a bed to a chair (including a wheelchair)?: A Lot Help needed standing up from a chair using your arms (e.g., wheelchair or bedside chair)?: A Lot Help needed to walk in hospital room?: A Lot Help needed climbing 3-5 steps with a railing? : A Lot 6 Click Score: 12    End of Session Equipment Utilized During Treatment: Oxygen (4 L) Activity Tolerance: Other (comment);Patient limited by pain;Patient limited by fatigue (limited by anxiety) Patient left: in chair;with call bell/phone within reach;with family/visitor present Nurse Communication: Mobility status PT Visit Diagnosis: Unsteadiness on feet (R26.81);Other abnormalities of gait and mobility (R26.89);Muscle weakness (generalized) (M62.81);History of  falling (Z91.81)     Time: 9937-1696 PT Time Calculation (min) (ACUTE ONLY): 24 min  Charges:  $Therapeutic Activity: 23-37 mins                     Jetta Lout PTA 01/21/22, 3:51 PM

## 2022-01-21 NOTE — Care Management Important Message (Signed)
Important Message  Patient Details  Name: Erik [name] MRN: [id number] Date of Birth: 1955/11/01   Medicare Important Message Given:  Yes     Olegario Messier A Virgal [place] 01/21/2022, 11:43 AM

## 2022-01-22 LAB — BASIC METABOLIC PANEL
Anion gap: 8 (ref 5–15)
BUN: 24 mg/dL — ABNORMAL HIGH (ref 8–23)
CO2: 36 mmol/L — ABNORMAL HIGH (ref 22–32)
Calcium: 8.9 mg/dL (ref 8.9–10.3)
Chloride: 90 mmol/L — ABNORMAL LOW (ref 98–111)
Creatinine, Ser: 0.91 mg/dL (ref 0.61–1.24)
GFR, Estimated: >60 mL/min (ref >60)
Glucose, Bld: 94 mg/dL (ref 70–99)
Potassium: 4.3 mmol/L (ref 3.5–5.1)
Sodium: 134 mmol/L — ABNORMAL LOW (ref 135–145)

## 2022-01-22 MED ORDER — SULFAMETHOXAZOLE-TRIMETHOPRIM 800-160 MG PO TABS
1.0000 | ORAL_TABLET | Freq: Two times a day (BID) | ORAL | Status: DC
  Administered 2022-01-22 – 2022-01-24 (×5): 1 via ORAL
  Filled 2022-01-22 (×5): qty 1

## 2022-01-22 NOTE — Progress Notes (Signed)
[facility] PROGRESS NOTE    Progress Note  Erik [name]  UXL:[id number] DOB: Jul 14, 1956 DOA: 01/13/2022 PCP: Sallyanne Kuster, NP     Brief Narrative:   Erik [name] is an 66 y.o. male past medical history significant for CAD status post PCI with chronic systolic heart failure by echo on February 2022 with an EF of 40%, permanent atrial fibrillation on aspirin (not on anticoagulation due to preference) COPD on home oxygen, chronic venous stasis who saw his cardiologist in May 2023 was complaining of increased fluid retention and shortness of breath at that time.  We will start her on torsemide and increase twice a day comes into the ED with chronic lower extremity swelling associated with dyspnea on exertion.  He eventually had a mechanical fall 5 days prior to admission which reduces mobility.   Assessment/Plan:   Bilateral cellulitis of lower leg Blood cultures on admission 01/13/2022 were negative. Has remained afebrile leukocytosis is improved. We will transition him to oral Bactrim Surveillance blood culture 01/19/2022 remain negative  Acute on chronic systolic heart failure: His estimated dry weight now in the hospital is around 109 kg. He is negative about 16 L. Liberalize his diet. BNP was 122 continue strict I's and O's and daily weights. Continue to hold diuretics continue Cozaar.  Ulcer of extremity due to chronic venous insufficiency (HCC) Continue current dressing changes per nurse.  Ambulatory dysfunction Multifactorial related to venous insufficiency and deconditioning in the setting of heart failure and COPD which required oxygen with a recent fall. Physical therapy evaluated the patient recommended skilled nursing facility. TOC involved patient has agreed to go to skilled nursing facility.  Chronic atrial fibrillation: Was seen by cardiology in May 2023. Not interested on anticoagulation currently on aspirin 325 and Lopressor.  Coronary artery  disease: Chest pain-free continue aspirin resume statins after discharge.  Left hip pain: X-ray of the hip showed bilateral osteoarthritis left greater than right. Continue tramadol and gabapentin. MRI of the hip showed no evidence of acute fracture severe osteoarthritis of both hip worse on the left, associated with prominent loose body anteriorly on the left.  Acute/subacute compression over to vertebral body approximately 30% height loss, MRI was done that showed compression fraction of L2 no retropulsion. Chronic compression of L4. Relates his pain is controlled with narcotics, has had regular bowel movement with MiraLAX we will continue current regimen.  DVT prophylaxis: lovenox Family Communication:none Status is: Inpatient Remains inpatient appropriate because: Cellulitis will need skilled nursing facility placement.    Code Status:     Code Status Orders  (From admission, onward)           Start     Ordered   01/13/22 2115  Full code  Continuous        01/13/22 2117           Code Status History     Date Active Date Inactive Code Status Order ID Comments User Context   08/14/2017 0126 08/19/2017 1610 Full Code [id number]  Cammy Copa, MD Inpatient         IV Access:   Peripheral IV   Procedures and diagnostic studies:   MR LUMBAR SPINE WO CONTRAST  Result Date: 01/20/2022 CLINICAL DATA:  Low back pain, chronic. EXAM: MRI LUMBAR SPINE WITHOUT CONTRAST TECHNIQUE: Multiplanar, multisequence MR imaging of the lumbar spine was performed. No intravenous contrast was administered. COMPARISON:  None Available. FINDINGS: Segmentation:  Standard. Alignment:  Physiologic. Vertebrae: Compression fracture of the L2 superior  endplate resulting in loss of approximately 30% vertebral body height with associated marrow edema, consistent with acute/subacute fracture. There is no retropulsion. Chronic compression fracture of the L4 vertebral body with approximately 50%  height loss height loss and no significant retropulsion. No evidence of discitis or aggressive bone lesion. Conus medullaris and cauda equina: Conus extends to the L1-2 level. Conus and cauda equina appear normal. Paraspinal and other soft tissues: Negative. Disc levels: T12-L1: No spinal canal or neural foraminal stenosis. L1-2: No spinal canal or neural foraminal stenosis. L2-3: Mild facet degenerative changes. No spinal canal or neural foraminal stenosis. L3-4: Disc bulge and mild facet degenerative changes without significant spinal canal or neural foraminal stenosis. L4-5: Shallow disc bulge and mild facet degenerative changes without significant spinal canal or neural foraminal stenosis. L5-S1: Mild facet degenerative changes. No spinal canal or neural foraminal stenosis. IMPRESSION: 1. Acute/subacute compression fracture of the L2 vertebral body with approximately 30% height loss. No retropulsion. 2. Chronic compression fracture of the L4 vertebral body resulting in approximately 50% height loss without retropulsion. 3. Mild degenerative changes of the lumbar spine without high-grade spinal canal or neural foraminal stenosis at any level. Electronically Signed   By: Baldemar Lenis M.D.   On: 01/20/2022 14:08     Medical Consultants:   None.   Subjective:    Erik [name] relates his pain is controlled.  Objective:    Vitals:   01/21/22 2015 01/22/22 0500 01/22/22 0528 01/22/22 0812  BP: (!) 98/54  120/66 (!) 128/98  Pulse: 98  97 (!) 104  Resp: 18  20 20   Temp: 98 F (36.7 C)  (!) 97.4 F (36.3 C) 98 F (36.7 C)  TempSrc: Oral  Oral   SpO2: 93%  91% 93%  Weight:  108.2 kg    Height:       SpO2: 93 % O2 Flow Rate (L/min): 3 L/min   Intake/Output Summary (Last 24 hours) at 01/22/2022 0829 Last data filed at 01/22/2022 0509 Gross per 24 hour  Intake 500 ml  Output 1025 ml  Net -525 ml    Filed Weights   01/20/22 0500 01/21/22 0500 01/22/22 0500  Weight:  109.4 kg 109.7 kg 108.2 kg    Exam: General exam: In no acute distress. Respiratory system: Good air movement and clear to auscultation. Cardiovascular system: S1 & S2 heard, RRR. No JVD. Gastrointestinal system: Abdomen is nondistended, soft and nontender.  Extremities: No pedal edema. Skin: No rashes, lesions or ulcers Psychiatry: Judgement and insight appear normal. Mood & affect appropriate.   Data Reviewed:    Labs: Basic Metabolic Panel: Recent Labs  Lab 01/16/22 0419 01/17/22 0417 01/18/22 0552 01/19/22 0431 01/20/22 0450 01/21/22 0629 01/22/22 0453  NA 137 137 134* 131* 135 133* 134*  K 3.9 4.0 4.1 4.0 4.2 4.0 4.3  CL 98 96* 92* 90* 93* 91* 90*  CO2 33* 34* 37* 32 35* 33* 36*  GLUCOSE 123* 107* 99 177* 106* 90 94  BUN 19 14 16 22 18 21  24*  CREATININE 0.70 0.82 0.71 0.82 0.65 0.67 0.91  CALCIUM 8.8* 8.7* 8.7* 9.0 9.0 8.7* 8.9  MG 2.1 2.1 2.0 2.0  --   --   --     GFR Estimated Creatinine Clearance: 97 mL/min (by C-G formula based on SCr of 0.91 mg/dL). Liver Function Tests: No results for input(s): "AST", "ALT", "ALKPHOS", "BILITOT", "PROT", "ALBUMIN" in the last 168 hours.  No results for input(s): "LIPASE", "AMYLASE" in  the last 168 hours. No results for input(s): "AMMONIA" in the last 168 hours. Coagulation profile No results for input(s): "INR", "PROTIME" in the last 168 hours. COVID-19 Labs  No results for input(s): "DDIMER", "FERRITIN", "LDH", "CRP" in the last 72 hours.  No results found for: "SARSCOV2NAA"  CBC: Recent Labs  Lab 01/17/22 0417 01/18/22 0552 01/19/22 0431 01/20/22 0450 01/21/22 0629  WBC 13.7* 14.6* 16.4* 11.7* 11.8*  HGB 13.7 13.7 14.2 13.6 13.1  HCT 42.9 42.5 44.1 41.8 40.8  MCV 94.5 94.0 92.6 92.5 91.5  PLT 399 369 433* 398 410*    Cardiac Enzymes: No results for input(s): "CKTOTAL", "CKMB", "CKMBINDEX", "TROPONINI" in the last 168 hours. BNP (last 3 results) No results for input(s): "PROBNP" in the last 8760  hours. CBG: No results for input(s): "GLUCAP" in the last 168 hours. D-Dimer: No results for input(s): "DDIMER" in the last 72 hours. Hgb A1c: No results for input(s): "HGBA1C" in the last 72 hours. Lipid Profile: No results for input(s): "CHOL", "HDL", "LDLCALC", "TRIG", "CHOLHDL", "LDLDIRECT" in the last 72 hours. Thyroid function studies: No results for input(s): "TSH", "T4TOTAL", "T3FREE", "THYROIDAB" in the last 72 hours.  Invalid input(s): "FREET3" Anemia work up: No results for input(s): "VITAMINB12", "FOLATE", "FERRITIN", "TIBC", "IRON", "RETICCTPCT" in the last 72 hours. Sepsis Labs: Recent Labs  Lab 01/18/22 0552 01/19/22 0431 01/20/22 0450 01/21/22 0629  WBC 14.6* 16.4* 11.7* 11.8*    Microbiology Recent Results (from the past 240 hour(s))  Blood culture (routine x 2)     Status: None   Collection Time: 01/13/22  7:52 PM   Specimen: BLOOD  Result Value Ref Range Status   Specimen Description BLOOD RIGHT [facility]  Final   Special Requests   Final    BOTTLES DRAWN AEROBIC AND ANAEROBIC Blood Culture adequate volume   Culture   Final    NO GROWTH 5 DAYS Performed at [facility], [address] [place], [place] [address]    Report Status 01/18/2022 FINAL  Final  Blood culture (routine x 2)     Status: None   Collection Time: 01/13/22  7:53 PM   Specimen: BLOOD  Result Value Ref Range Status   Specimen Description BLOOD LEFT ANTECUBITAL  Final   Special Requests   Final    BOTTLES DRAWN AEROBIC AND ANAEROBIC Blood Culture results may not be optimal due to an inadequate volume of blood received in culture bottles   Culture   Final    NO GROWTH 5 DAYS Performed at [facility], [address], [place], [place] [address]    Report Status 01/18/2022 FINAL  Final  Aerobic Culture w Gram Stain (superficial specimen)     Status: Abnormal   Collection Time: 01/17/22 10:00 AM   Specimen: Wound  Result Value Ref Range Status   Specimen Description    Final    WOUND Performed at [facility], [address], [place], [place] [address]    Special Requests   Final    LEFT LEG Performed at [facility], [address] [place], [place] [address]    Gram Stain   Final    NO WBC SEEN RARE Romie Minus NEGATIVE RODS RARE GRAM POSITIVE RODS Performed at [facility], [address], [place], [place] [address]    Culture MULTIPLE ORGANISMS PRESENT, NONE PREDOMINANT (A)  Final   Report Status 01/20/2022 FINAL  Final  Culture, blood (Routine X 2) w Reflex to ID Panel  Status: None (Preliminary result)   Collection Time: 01/19/22  7:48 AM   Specimen: BLOOD  Result Value Ref Range Status   Specimen Description BLOOD [facility]  Final   Special Requests   Final    BOTTLES DRAWN AEROBIC AND ANAEROBIC Blood Culture adequate volume   Culture   Final    NO GROWTH 3 DAYS Performed at [facility], [address] [place], [place] [address]    Report Status PENDING  Incomplete  Culture, blood (Routine X 2) w Reflex to ID Panel     Status: None (Preliminary result)   Collection Time: 01/19/22  7:59 AM   Specimen: BLOOD  Result Value Ref Range Status   Specimen Description BLOOD BLOOD RIGHT HAND  Final   Special Requests   Final    BOTTLES DRAWN AEROBIC AND ANAEROBIC Blood Culture results may not be optimal due to an excessive volume of blood received in culture bottles   Culture   Final    NO GROWTH 3 DAYS Performed at [facility], [address] [place], [place] [address]    Report Status PENDING  Incomplete     Medications:    aspirin EC  325 mg Oral Daily   docusate sodium  100 mg Oral Daily   enoxaparin (LOVENOX) injection  0.5 mg/kg Subcutaneous Q24H   fluticasone furoate-vilanterol  1 puff Inhalation Daily   gabapentin  300 mg Oral TID   losartan  25 mg Oral Daily   metoprolol tartrate  25 mg Oral BID   polyethylene glycol  17 g Oral Daily   rosuvastatin  10 mg Oral Daily   saccharomyces  boulardii  250 mg Oral BID   torsemide  10 mg Oral Daily   umeclidinium bromide  1 puff Inhalation Daily   Continuous Infusions:  cefTRIAXone (ROCEPHIN)  IV 2 g (01/22/22 8466)   vancomycin 1,500 mg (01/21/22 1122)      LOS: 9 days   Marinda Elk  [facility]  01/22/2022, 8:29 AM

## 2022-01-22 NOTE — Progress Notes (Signed)
Mobility Specialist - Progress Note    01/22/22 1500  Mobility  Activity Transferred to/from BSC;Dangled on edge of bed  Level of Assistance Moderate assist, patient does 50-74%  Assistive Device Front wheel walker  Distance Ambulated (ft) 0 ft  Activity Response Tolerated well  $Mobility charge 1 Mobility   Pt is semi supine upon arrival using 3L. Pt grimacing from significant back pain throughout. Completes bed mobility MinA and STS ModA from elevated bed for squat-pivot transfer. SOB post transfer and ~ 5 minute recovery sitting EOB. Tolerates well throughout but limited by pain. Pt is left EOB with NT aware and needs in reach.  Clarisa Schools Mobility Specialist 01/22/22, 3:49 PM

## 2022-01-23 DIAGNOSIS — L03115 Cellulitis of right lower limb: Secondary | ICD-10-CM | POA: Diagnosis not present

## 2022-01-23 DIAGNOSIS — L03116 Cellulitis of left lower limb: Secondary | ICD-10-CM | POA: Diagnosis not present

## 2022-01-23 LAB — BASIC METABOLIC PANEL
Anion gap: 6 (ref 5–15)
BUN: 18 mg/dL (ref 8–23)
CO2: 36 mmol/L — ABNORMAL HIGH (ref 22–32)
Calcium: 9 mg/dL (ref 8.9–10.3)
Chloride: 93 mmol/L — ABNORMAL LOW (ref 98–111)
Creatinine, Ser: 0.85 mg/dL (ref 0.61–1.24)
GFR, Estimated: >60 mL/min (ref >60)
Glucose, Bld: 103 mg/dL — ABNORMAL HIGH (ref 70–99)
Potassium: 4.6 mmol/L (ref 3.5–5.1)
Sodium: 135 mmol/L (ref 135–145)

## 2022-01-23 MED ORDER — SODIUM CHLORIDE 0.9 % IV BOLUS
500.0000 mL | Freq: Once | INTRAVENOUS | Status: AC
Start: 2022-01-23 — End: 2022-01-23
  Administered 2022-01-23: 500 mL via INTRAVENOUS

## 2022-01-23 NOTE — TOC Progression Note (Addendum)
Transition of Care [facility]) - Progression Note    Patient Details  Name: ODEL SCHMID MRN: [id number] Date of Birth: 02-Mar-1956  [facility] CM/SW Contact  Bing Quarry, RN Phone Number: 01/23/2022, 2:53 PM  Clinical Narrative:  6/17: Spoke with patient regarding bed offers to date. He lives with wife in [place] and thought either [facility] in [place], or [facility] in [place] would be closer to [facility], but stated he did not really know anything about them. Prior notes indicated he has concerns about insurance coverage/costs and this was reflected in previous case manager progress notes. His spouse was not present due to being sick per patient. Patient complained of a lot of pain in right shoulder and upper back area. Discussion was interrupted by nursing care, but [facility] or [facility] seem to be first choices pending financial questions.   Sent an email to financial counseling to help address patient questions more specifically while making this decision. Gabriel Cirri RN CM     Expected Discharge Plan: Home w Home Health Services Barriers to Discharge: Continued Medical Work up  Expected Discharge Plan and Services Expected Discharge Plan: Home w Home Health Services       Living arrangements for the past 2 months: Single Family Home                                       Social Determinants of Health (SDOH) Interventions    Readmission Risk Interventions     No data to display

## 2022-01-23 NOTE — Progress Notes (Signed)
[facility] PROGRESS NOTE    Progress Note  Erik [name]  KKX:[id number] DOB: Jan 03, 1956 DOA: 01/13/2022 PCP: Sallyanne Kuster, NP     Brief Narrative:   Erik [name] is an 66 y.o. male past medical history significant for CAD status post PCI with chronic systolic heart failure by echo on February 2022 with an EF of 40%, permanent atrial fibrillation on aspirin (not on anticoagulation due to preference) COPD on home oxygen, chronic venous stasis who saw his cardiologist in May 2023 was complaining of increased fluid retention and shortness of breath at that time.  We will start her on torsemide and increase twice a day comes into the ED with chronic lower extremity swelling associated with dyspnea on exertion.  He eventually had a mechanical fall 5 days prior to admission which reduces mobility.  Patient is awaiting skilled nursing facility placement.   Assessment/Plan:   Bilateral cellulitis of lower leg Blood cultures on admission 01/13/2022 were negative. Has remained afebrile leukocytosis is improved. We will transition him to oral Bactrim, will need to have basic metabolic panel checked in 1 week. Creatinine has remained stable in house. Surveillance blood culture 01/19/2022 remain negative  Acute on chronic systolic heart failure: His estimated dry weight now in the hospital is around 109 kg. He is negative about 19 L. Diuretics have been held, he was continued on Cozaar. Diet has been liberalized.   Continue strict I's and O's and daily weight.  Ulcer of extremity due to chronic venous insufficiency (HCC) Continue current dressing changes per nurse.  Ambulatory dysfunction Multifactorial related to venous insufficiency and deconditioning in the setting of heart failure and COPD which required oxygen with a recent fall. Physical therapy evaluated the patient recommended skilled nursing facility. TOC involved patient has agreed to go to skilled nursing  facility.  Chronic atrial fibrillation: Was seen by cardiology in May 2023. Not interested on anticoagulation currently on aspirin 325 and Lopressor.  Coronary artery disease: Chest pain-free continue aspirin resume statins after discharge.  Left hip pain: X-ray of the hip showed bilateral osteoarthritis left greater than right. Continue tramadol and gabapentin. MRI of the hip showed no evidence of acute fracture severe osteoarthritis of both hip worse on the left, associated with prominent loose body anteriorly on the left.  Acute/subacute compression over to vertebral body approximately 30% height loss, MRI was done that showed compression fraction of L2 no retropulsion. Chronic compression of L4. Relates his pain is controlled with narcotics, has had regular bowel movement with MiraLAX we will continue current regimen.  DVT prophylaxis: lovenox Family Communication:none Status is: Inpatient Remains inpatient appropriate because: Cellulitis will need skilled nursing facility placement.    Code Status:     Code Status Orders  (From admission, onward)           Start     Ordered   01/13/22 2115  Full code  Continuous        01/13/22 2117           Code Status History     Date Active Date Inactive Code Status Order ID Comments User Context   08/14/2017 0126 08/19/2017 1610 Full Code [id number]  Cammy Copa, MD Inpatient         IV Access:   Peripheral IV   Procedures and diagnostic studies:   No results found.   Medical Consultants:   None.   Subjective:    Greysin L Creson relates his pain is controlled.  Bowel movements.  Objective:    Vitals:   01/22/22 2009 01/23/22 0500 01/23/22 0556 01/23/22 0742  BP: 125/74  124/74 120/87  Pulse: 83  85 (!) 110  Resp: 20  20 20   Temp: 98.8 F (37.1 C)  98.1 F (36.7 C) 98.4 F (36.9 C)  TempSrc:    Oral  SpO2: 92%  92% 96%  Weight:  108.7 kg    Height:       SpO2: 96 % O2 Flow Rate  (L/min): 2 L/min   Intake/Output Summary (Last 24 hours) at 01/23/2022 0914 Last data filed at 01/23/2022 0700 Gross per 24 hour  Intake 240 ml  Output 2650 ml  Net -2410 ml    Filed Weights   01/21/22 0500 01/22/22 0500 01/23/22 0500  Weight: 109.7 kg 108.2 kg 108.7 kg    Exam: General exam: In no acute distress. Respiratory system: Good air movement and clear to auscultation. Cardiovascular system: S1 & S2 heard, RRR. No JVD. Gastrointestinal system: Abdomen is nondistended, soft and nontender.  Extremities: No pedal edema. Skin: No rashes, lesions or ulcers Psychiatry: Judgement and insight appear normal. Mood & affect appropriate.  Data Reviewed:    Labs: Basic Metabolic Panel: Recent Labs  Lab 01/17/22 0417 01/18/22 0552 01/19/22 0431 01/20/22 0450 01/21/22 0629 01/22/22 0453 01/23/22 0446  NA 137 134* 131* 135 133* 134* 135  K 4.0 4.1 4.0 4.2 4.0 4.3 4.6  CL 96* 92* 90* 93* 91* 90* 93*  CO2 34* 37* 32 35* 33* 36* 36*  GLUCOSE 107* 99 177* 106* 90 94 103*  BUN 14 16 22 18 21  24* 18  CREATININE 0.82 0.71 0.82 0.65 0.67 0.91 0.85  CALCIUM 8.7* 8.7* 9.0 9.0 8.7* 8.9 9.0  MG 2.1 2.0 2.0  --   --   --   --     GFR Estimated Creatinine Clearance: 104.1 mL/min (by C-G formula based on SCr of 0.85 mg/dL). Liver Function Tests: No results for input(s): "AST", "ALT", "ALKPHOS", "BILITOT", "PROT", "ALBUMIN" in the last 168 hours.  No results for input(s): "LIPASE", "AMYLASE" in the last 168 hours. No results for input(s): "AMMONIA" in the last 168 hours. Coagulation profile No results for input(s): "INR", "PROTIME" in the last 168 hours. COVID-19 Labs  No results for input(s): "DDIMER", "FERRITIN", "LDH", "CRP" in the last 72 hours.  No results found for: "SARSCOV2NAA"  CBC: Recent Labs  Lab 01/17/22 0417 01/18/22 0552 01/19/22 0431 01/20/22 0450 01/21/22 0629  WBC 13.7* 14.6* 16.4* 11.7* 11.8*  HGB 13.7 13.7 14.2 13.6 13.1  HCT 42.9 42.5 44.1 41.8  40.8  MCV 94.5 94.0 92.6 92.5 91.5  PLT 399 369 433* 398 410*    Cardiac Enzymes: No results for input(s): "CKTOTAL", "CKMB", "CKMBINDEX", "TROPONINI" in the last 168 hours. BNP (last 3 results) No results for input(s): "PROBNP" in the last 8760 hours. CBG: No results for input(s): "GLUCAP" in the last 168 hours. D-Dimer: No results for input(s): "DDIMER" in the last 72 hours. Hgb A1c: No results for input(s): "HGBA1C" in the last 72 hours. Lipid Profile: No results for input(s): "CHOL", "HDL", "LDLCALC", "TRIG", "CHOLHDL", "LDLDIRECT" in the last 72 hours. Thyroid function studies: No results for input(s): "TSH", "T4TOTAL", "T3FREE", "THYROIDAB" in the last 72 hours.  Invalid input(s): "FREET3" Anemia work up: No results for input(s): "VITAMINB12", "FOLATE", "FERRITIN", "TIBC", "IRON", "RETICCTPCT" in the last 72 hours. Sepsis Labs: Recent Labs  Lab 01/18/22 0552 01/19/22 0431 01/20/22 0450 01/21/22 0629  WBC 14.6* 16.4* 11.7* 11.8*  Microbiology Recent Results (from the past 240 hour(s))  Blood culture (routine x 2)     Status: None   Collection Time: 01/13/22  7:52 PM   Specimen: BLOOD  Result Value Ref Range Status   Specimen Description BLOOD RIGHT [facility]  Final   Special Requests   Final    BOTTLES DRAWN AEROBIC AND ANAEROBIC Blood Culture adequate volume   Culture   Final    NO GROWTH 5 DAYS Performed at [facility], [address] [place], [place] [address]    Report Status 01/18/2022 FINAL  Final  Blood culture (routine x 2)     Status: None   Collection Time: 01/13/22  7:53 PM   Specimen: BLOOD  Result Value Ref Range Status   Specimen Description BLOOD LEFT ANTECUBITAL  Final   Special Requests   Final    BOTTLES DRAWN AEROBIC AND ANAEROBIC Blood Culture results may not be optimal due to an inadequate volume of blood received in culture bottles   Culture   Final    NO GROWTH 5 DAYS Performed at [facility], [address]  [place], [place] [address]    Report Status 01/18/2022 FINAL  Final  Aerobic Culture w Gram Stain (superficial specimen)     Status: Abnormal   Collection Time: 01/17/22 10:00 AM   Specimen: Wound  Result Value Ref Range Status   Specimen Description   Final    WOUND Performed at [facility], [address] [place], [place] [address]    Special Requests   Final    LEFT LEG Performed at [facility], [address] [place], [place] [address]    Gram Stain   Final    NO WBC SEEN RARE Romie Minus NEGATIVE RODS RARE GRAM POSITIVE RODS Performed at [facility], [address], [place], [place] [address]    Culture MULTIPLE ORGANISMS PRESENT, NONE PREDOMINANT (A)  Final   Report Status 01/20/2022 FINAL  Final  Culture, blood (Routine X 2) w Reflex to ID Panel     Status: None (Preliminary result)   Collection Time: 01/19/22  7:48 AM   Specimen: BLOOD  Result Value Ref Range Status   Specimen Description BLOOD [facility]  Final   Special Requests   Final    BOTTLES DRAWN AEROBIC AND ANAEROBIC Blood Culture adequate volume   Culture   Final    NO GROWTH 4 DAYS Performed at [facility], [address], [place], [place] [address]    Report Status PENDING  Incomplete  Culture, blood (Routine X 2) w Reflex to ID Panel     Status: None (Preliminary result)   Collection Time: 01/19/22  7:59 AM   Specimen: BLOOD  Result Value Ref Range Status   Specimen Description BLOOD BLOOD RIGHT HAND  Final   Special Requests   Final    BOTTLES DRAWN AEROBIC AND ANAEROBIC Blood Culture results may not be optimal due to an excessive volume of blood received in culture bottles   Culture   Final    NO GROWTH 4 DAYS Performed at [facility], [address] [place], [place] [address]    Report Status PENDING  Incomplete     Medications:    aspirin EC  325 mg Oral Daily   docusate sodium  100 mg Oral Daily   enoxaparin (LOVENOX) injection  0.5 mg/kg Subcutaneous  Q24H   fluticasone furoate-vilanterol  1 puff Inhalation Daily   gabapentin  300  mg Oral TID   losartan  25 mg Oral Daily   metoprolol tartrate  25 mg Oral BID   rosuvastatin  10 mg Oral Daily   saccharomyces boulardii  250 mg Oral BID   sulfamethoxazole-trimethoprim  1 tablet Oral Q12H   umeclidinium bromide  1 puff Inhalation Daily   Continuous Infusions:  cefTRIAXone (ROCEPHIN)  IV 2 g (01/23/22 0848)      LOS: 10 days   Marinda Elk  [facility]  01/23/2022, 9:14 AM

## 2022-01-24 LAB — CBC
HCT: 37.9 % — ABNORMAL LOW (ref 39.0–52.0)
Hemoglobin: 12.1 g/dL — ABNORMAL LOW (ref 13.0–17.0)
MCH: 29.2 pg (ref 26.0–34.0)
MCHC: 31.9 g/dL (ref 30.0–36.0)
MCV: 91.5 fL (ref 80.0–100.0)
Platelets: 391 10*3/uL (ref 150–400)
RBC: 4.14 MIL/uL — ABNORMAL LOW (ref 4.22–5.81)
RDW: 12.6 % (ref 11.5–15.5)
WBC: 10.9 10*3/uL — ABNORMAL HIGH (ref 4.0–10.5)
nRBC: 0 % (ref 0.0–0.2)

## 2022-01-24 LAB — BASIC METABOLIC PANEL
Anion gap: 7 (ref 5–15)
BUN: 15 mg/dL (ref 8–23)
CO2: 32 mmol/L (ref 22–32)
Calcium: 8.5 mg/dL — ABNORMAL LOW (ref 8.9–10.3)
Chloride: 94 mmol/L — ABNORMAL LOW (ref 98–111)
Creatinine, Ser: 0.76 mg/dL (ref 0.61–1.24)
GFR, Estimated: >60 mL/min (ref >60)
Glucose, Bld: 105 mg/dL — ABNORMAL HIGH (ref 70–99)
Potassium: 4 mmol/L (ref 3.5–5.1)
Sodium: 133 mmol/L — ABNORMAL LOW (ref 135–145)

## 2022-01-24 MED ORDER — SULFAMETHOXAZOLE-TRIMETHOPRIM 800-160 MG PO TABS
1.0000 | ORAL_TABLET | Freq: Two times a day (BID) | ORAL | Status: DC
  Administered 2022-01-24 – 2022-01-25 (×2): 1 via ORAL
  Filled 2022-01-24 (×2): qty 1

## 2022-01-24 NOTE — TOC Progression Note (Addendum)
Transition of Care [facility]) - Progression Note    Patient Details  Name: Erik [name] MRN: [id number] Date of Birth: [birth year]  Transition of Care [facility] CM/SW Contact  Gildardo Griffes, [place] Phone Number: 01/24/2022, 3:03 PM  Clinical Narrative:     Update: Auth approved , Vesta Mixer ID [id number] Ricky with [facility] updated plan for dc to [facility] tomorrow.     Patient provided bed offers, chose [facility] in [place], reports he will update wife on plan.  Ricky at [employer] updated and auth started with [facility].    Expected Discharge Plan: Home w Home Health Services Barriers to Discharge: Continued Medical Work up  Expected Discharge Plan and Services Expected Discharge Plan: Home w Home Health Services       Living arrangements for the past 2 months: Single Family Home                                       Social Determinants of Health (SDOH) Interventions    Readmission Risk Interventions     No data to display

## 2022-01-24 NOTE — Consult Note (Signed)
ORTHOPAEDIC CONSULTATION  REQUESTING PHYSICIAN: Marinda Elk, MD  Chief Complaint:   Left hip pain.  History of Present Illness: Erik [name] is a 66 y.o. male with multiple medical problems including asthma, COPD, congestive heart failure, coronary artery disease status post a prior MI, gastroesophageal reflux disease, hypertension, and atrial fibrillation who was admitted for bilateral lower extremity ulcerations and cellulitis last week.  The patient developed increased left hip pain several days ago, prompting orthopedic consultation.  The patient notes that he has had a long history of left greater than right hip pain, but he has been trying to endure the symptoms.  He has been evaluated but was not told that he had arthritis in his hips.  The patient notes that his symptoms are worse with any prolonged standing or ambulation, as well as with certain twisting motions.  He denies any specific injury to his hips in the past.  Past Medical History:  Diagnosis Date   Arthritis    Asthma    Atrial fibrillation (HCC)    CHF (congestive heart failure) (HCC)    COPD (chronic obstructive pulmonary disease) (HCC)    Coronary artery disease    GERD (gastroesophageal reflux disease)    Hypertension    Myocardial infarction (HCC)    Shortness of breath dyspnea    Past Surgical History:  Procedure Laterality Date   ANGIOPLASTY  2009   CARDIAC CATHETERIZATION N/A 03/18/2015   Procedure: Left Heart Cath with Coronary Angiography;  Surgeon: Lamar Blinks, MD;  Location: [facility] INVASIVE CV LAB;  Service: Cardiovascular;  Laterality: N/A;   CHOLECYSTECTOMY  2005   CORONARY ANGIOGRAM  2009   Social History   Socioeconomic History   Marital status: Married    Spouse name: Not on file   Number of children: Not on file   Years of education: Not on file   Highest education level: Not on file  Occupational History   Not on  file  Tobacco Use   Smoking status: Former    Years: 25.00    Types: Cigarettes    Quit date: 03/20/1997    Years since quitting: 24.8   Smokeless tobacco: Never  Vaping Use   Vaping Use: Never used  Substance and Sexual Activity   Alcohol use: No   Drug use: No   Sexual activity: Not on file  Other Topics Concern   Not on file  Social History Narrative   Not on file   Social Determinants of Health   Financial Resource Strain: Low Risk  (11/04/2020)   Overall Financial Resource Strain (CARDIA)    Difficulty of Paying Living Expenses: Not hard at all  Food Insecurity: Not on file  Transportation Needs: Not on file  Physical Activity: Not on file  Stress: Not on file  Social Connections: Not on file   Family History  Problem Relation Age of Onset   Coronary artery disease Father    Diabetes Father    Hyperlipidemia Father    Hypertension Father    Stroke Mother    Hyperlipidemia Mother    Allergies  Allergen Reactions   Benadryl [Diphenhydramine Hcl] Shortness Of Breath   Morphine And Related Other (See Comments)    Difficulty breathing per patient    Prior to Admission medications   Medication Sig Start Date End Date Taking? Authorizing Provider  aspirin EC 325 MG tablet Take 325 mg by mouth daily.   Yes [provider]  metoprolol tartrate (LOPRESSOR) 25 MG tablet  TAKE TWO TABLETS BY MOUTH EVERY MORNING and TAKE TWO TABLETS BY MOUTH EVERY EVENING 12/28/21  Yes Furth, Cadence H, PA-C  torsemide (DEMADEX) 10 MG tablet TAKE ONE TABLET BY MOUTH ONCE DAILY 12/28/21  Yes Furth, Cadence H, PA-C  collagenase (SANTYL) ointment Apply 1 application with unna wrap changes Patient not taking: Reported on 12/17/2021 11/09/20   Georgiana Spinner, NP  ipratropium-albuterol (DUONEB) 0.5-2.5 (3) MG/3ML SOLN USE 1 VIAL VIA NEBULIZER EVERY 6 HOURS Patient not taking: Reported on 01/14/2022 12/27/21   Sallyanne Kuster, NP  losartan (COZAAR) 25 MG tablet Take 1 tablet (25 mg) by mouth  once daily Patient not taking: Reported on 12/17/2021    [provider]  OXYGEN Inhale 2-3 L into the lungs daily.    [provider]  rosuvastatin (CRESTOR) 10 MG tablet  12/01/20   [provider]  sulfamethoxazole-trimethoprim (BACTRIM DS) 800-160 MG tablet Take 2 tablets by mouth 2 (two) times daily. Patient not taking: Reported on 12/17/2021 11/09/21   Sallyanne Kuster, NP  TRELEGY ELLIPTA 100-62.5-25 MCG/INH AEPB Inhale 1 puff into the lungs daily. 04/14/21   Lyndon Code, MD   No results found.  Positive ROS: All other systems have been reviewed and were otherwise negative with the exception of those mentioned in the HPI and as above.  Physical Exam: General:  Alert, no acute distress Psychiatric:  Patient is competent for consent with normal mood and affect   Cardiovascular:  No pedal edema Respiratory:  No wheezing, non-labored breathing GI:  Abdomen is soft and non-tender Skin:  No lesions in the area of chief complaint Neurologic:  Sensation intact distally Lymphatic:  No axillary or cervical lymphadenopathy  Orthopedic Exam:  Orthopedic examination is limited to both lower extremities and feet.  Skin inspection of his lower legs is notable for advanced chronic venous stasis changes with notable discoloration and several open weeping sores.  He is grossly neurovascular intact in both feet and that he is able dorsiflex and plantarflex his toes and ankles.  Sensation is decreased subjectively, but present to light touch to both feet.  He has fair capillary refill to both feet.  X-rays:  Recent x-rays of the pelvis and left hip, as well as a recent MRI scan of the left hip are available for review and have been reviewed by myself.  These studies confirm the presence of advanced degenerative joint disease of the left hip with complete loss of the superior clear space, flattening of the femoral head, osteophyte formation, and the presence of a loose body in the  joint.  No fractures or acute bony processes are identified.  Assessment: Severe degenerative joint disease, left hip.  Plan: The treatment options have been discussed with the patient.  The patient would be certainly be a candidate for a total hip arthroplasty, based on the severity of his arthritic symptoms findings on plain radiographs and MRI scan.  However, we would not be able to contemplate this procedure given his open lower leg sores.  The treatment for the sores would have to be completed before consideration of joint replacement surgery.  Therefore, I would recommend that he continue to receive treatment for his lower leg ulcerations.  If and when these sores heal, then we may consider proceeding with surgery.  Thank you for asking me to participate in the care of this most unfortunate man.  We will be happy to see him back in the office in about 6 weeks for re-evaluation and further  discussion of surgical intervention if his lower leg ulcerations have healed.    Maryagnes Amos, MD  Beeper #:  [phone]  01/24/2022 3:12 PM

## 2022-01-24 NOTE — Progress Notes (Addendum)
Physical Therapy Treatment Patient Details Name: Erik [name] MRN: [id number] DOB: [birth year] Today's Date: 01/24/2022   History of Present Illness Erik [name] is a 66 y.o. male  with h/o HFrEF, COPD, HTN, CAD s/p MI, here with leg pain. Reports over the past week, he's had increasing bilateral, R>L swelling. R leg has gotten more painful and red, swollen as well. He has also had worsening cough, orthopnea, sputum production. Has had difficulty getting around due to his leg swelling and pain.    PT Comments    Pt in recliner on entry, appears fairly anxious while resting, has had a fluctuating tremor in limbs with active use today. Pt partakes in some basic leg exercises in chair then practice with technique, safety, and success with rising from a recliner. Obtaining a standing position requires >15 minutes over 3 attempts, 2 techniques, and several bouts of heightened anxiety and dyspnea. Flowrate adjusted to reflect his ergonomic oxygen needs. After standing for ~60 seconds, his sats drop to 83% while on 5L. He is bumped to 6L for recovery given his tachypnea and panic appearance. Pt up in chair at end of session. His TLSO has not arrived yet- this will be a necessity prior to partaking in gait training. Pt has difficulty with insight regarding his acutely deconditioned state, struggles with accepting that alternative techniques might be necessary to achieve basic mobility- his anxiety makes his willingness to attempt a new technique quite difficult at times.   Recommendations for follow up therapy are one component of a multi-disciplinary discharge planning process, led by the attending physician.  Recommendations may be updated based on patient status, additional functional criteria and insurance authorization.  Follow Up Recommendations  Skilled nursing-short term rehab (<3 hours/day)     Assistance Recommended at Discharge Frequent or constant Supervision/Assistance  Patient can return  home with the following A lot of help with walking and/or transfers;A lot of help with bathing/dressing/bathroom;Assist for transportation;Help with stairs or ramp for entrance;Assistance with cooking/housework   Equipment Recommendations       Recommendations for Other Services       Precautions / Restrictions Precautions Precautions: Fall;Back Required Braces or Orthoses: Spinal Brace Spinal Brace: Thoracolumbosacral orthotic Restrictions Weight Bearing Restrictions: No     Mobility  Bed Mobility               General bed mobility comments: in chair    Transfers Overall transfer level: Needs assistance Equipment used: Rolling walker (2 wheels) Transfers: Sit to/from Stand Sit to Stand: From elevated surface   Step pivot transfers: Mod assist, From elevated surface            Ambulation/Gait                   Stairs             Wheelchair Mobility    Modified Rankin (Stroke Patients Only)       Balance                                            Cognition Arousal/Alertness: Awake/alert Behavior During Therapy: WFL for tasks assessed/performed, Anxious Overall Cognitive Status: Within Functional Limits for tasks assessed  Exercises Other Exercises Other Exercises: Seated LAQ 1x12 bilat Other Exercises: Seated heel raies 1x12 bilat Other Exercises: STS from elevated recliner 1x3 over 10 minutes; modA and guest chair to successfully perform on 3rd attempt    General Comments        Pertinent Vitals/Pain Pain Assessment Pain Assessment: 0-10 Pain Score: 5  Pain Location: Rt shoulder, low back Pain Descriptors / Indicators: Discomfort, Grimacing, Guarding, Moaning Pain Intervention(s): Limited activity within patient's tolerance, Monitored during session    Home Living                          Prior Function            PT Goals  (current goals can now be found in the care plan section) Acute Rehab PT Goals Patient Stated Goal: less pain with better breathing PT Goal Formulation: With patient Time For Goal Achievement: 01/28/22 Potential to Achieve Goals: Fair Progress towards PT goals: Progressing toward goals    Frequency    Min 2X/week      PT Plan Current plan remains appropriate    Co-evaluation              AM-PAC PT "6 Clicks" Mobility   Outcome Measure  Help needed turning from your back to your side while in a flat bed without using bedrails?: A Lot Help needed moving from lying on your back to sitting on the side of a flat bed without using bedrails?: A Lot Help needed moving to and from a bed to a chair (including a wheelchair)?: A Lot Help needed standing up from a chair using your arms (e.g., wheelchair or bedside chair)?: A Lot Help needed to walk in hospital room?: Total Help needed climbing 3-5 steps with a railing? : Total 6 Click Score: 10    End of Session Equipment Utilized During Treatment: Oxygen;Back brace (brace not arrived yet) Activity Tolerance: Other (comment);Patient limited by pain;Patient limited by fatigue Patient left: in chair;with call bell/phone within reach;with nursing/sitter in room Nurse Communication: Mobility status PT Visit Diagnosis: Unsteadiness on feet (R26.81);Other abnormalities of gait and mobility (R26.89);Muscle weakness (generalized) (M62.81);History of falling (Z91.81)     Time: 1610-9604 PT Time Calculation (min) (ACUTE ONLY): 33 min  Charges:  $Therapeutic Exercise: 23-37 mins                    4:15 PM, 01/24/22 Rosamaria Lints, PT, DPT Physical Therapist - [facility]  [phone] (ASCOM)    Keeleigh Terris C 01/24/2022, 4:11 PM

## 2022-01-24 NOTE — Consult Note (Signed)
Neurosurgery-New Consultation Evaluation 01/24/2022 Erik [name] [id number]  Identifying Statement: Erik [name] is a 66 y.o. male from [place] [address] with history of CAD, HFrEF, A-Fib, COPD on home O2.  Physician Requesting Consultation: No ref. provider found  History of Present Illness: Erik [name] is a 66 y.o presenting to the hospital on 01/13/22 for worsening chronic leg swelling and weeping in his legs associated with worsening ambulation.  He had a mechanical fall 5 days prior to his presentation.  He remains in the hospital for multiple comorbidities. Neurosurgery was consulted due to an MRI showing concern for L2 compression fracture.  The patient states he does have some back pain at baseline however it has been improved over the last couple of days.  He is ambulating without significant back pain.  Past Medical History:  Past Medical History:  Diagnosis Date   Arthritis    Asthma    Atrial fibrillation (HCC)    CHF (congestive heart failure) (HCC)    COPD (chronic obstructive pulmonary disease) (HCC)    Coronary artery disease    GERD (gastroesophageal reflux disease)    Hypertension    Myocardial infarction (HCC)    Shortness of breath dyspnea     Social History: Social History   Socioeconomic History   Marital status: Married    Spouse name: Not on file   Number of children: Not on file   Years of education: Not on file   Highest education level: Not on file  Occupational History   Not on file  Tobacco Use   Smoking status: Former    Years: 25.00    Types: Cigarettes    Quit date: 03/20/1997    Years since quitting: 24.8   Smokeless tobacco: Never  Vaping Use   Vaping Use: Never used  Substance and Sexual Activity   Alcohol use: No   Drug use: No   Sexual activity: Not on file  Other Topics Concern   Not on file  Social History Narrative   Not on file   Social Determinants of Health   Financial Resource Strain: Low Risk  (11/04/2020)    Overall Financial Resource Strain (CARDIA)    Difficulty of Paying Living Expenses: Not hard at all  Food Insecurity: Not on file  Transportation Needs: Not on file  Physical Activity: Not on file  Stress: Not on file  Social Connections: Not on file  Intimate Partner Violence: Not on file    Family History: Family History  Problem Relation Age of Onset   Coronary artery disease Father    Diabetes Father    Hyperlipidemia Father    Hypertension Father    Stroke Mother    Hyperlipidemia Mother     Review of Systems:  Review of Systems - General ROS: Negative Psychological ROS: Negative Ophthalmic ROS: Negative ENT ROS: Negative Hematological and Lymphatic ROS: Negative  Endocrine ROS: Negative Respiratory ROS: Negative Cardiovascular ROS: Negative Gastrointestinal ROS: Negative Genito-Urinary ROS: Negative Musculoskeletal ROS: Negative Neurological ROS: Negative Dermatological ROS: Negative  Physical Exam: BP 114/71 (BP Location: Right Arm) Comment: recheck.  Pulse (!) 110   Temp 98 F (36.7 C)   Resp 17   Ht 5\' 10"  (1.778 m)   Wt 109.7 kg   SpO2 94%   BMI 34.70 kg/m  Body mass index is 34.7 kg/m. Body surface area is 2.33 meters squared. General appearance: Alert, cooperative, in no acute distress Head: Normocephalic, atraumatic Eyes: Normal, EOM intact Oropharynx: Moist without lesions  Neck: Supple, no tenderness Heart: Normal, regular rate and rhythm, without murmur Lungs: on O2 via [place]  Abdomen: Soft, nondistended Ext: significant signs of PVD bilaterally   Neurologic exam:  Mental status: alertness: alert, orientation: person, place, time, affect: normal Speech: fluent and clear Cranial nerves:  CN II-XII grossly intact Motor:strength symmetric 5/5, normal muscle mass and tone in all extremities and no pronator drift Sensory: Profound decrease sensation to light touch throughout bilateral lower extremities Reflexes: 1+ and symmetric bilaterally for  arms and legs Coordination: intact finger to nose Gait: untested   Laboratory: Results for orders placed or performed during the hospital encounter of 01/13/22  Blood culture (routine x 2)   Specimen: BLOOD  Result Value Ref Range   Specimen Description BLOOD RIGHT AC    Special Requests      BOTTLES DRAWN AEROBIC AND ANAEROBIC Blood Culture adequate volume   Culture      NO GROWTH 5 DAYS Performed at [facility], [address] [place], [place] [address]    Report Status 01/18/2022 FINAL   Blood culture (routine x 2)   Specimen: BLOOD  Result Value Ref Range   Specimen Description BLOOD LEFT ANTECUBITAL    Special Requests      BOTTLES DRAWN AEROBIC AND ANAEROBIC Blood Culture results may not be optimal due to an inadequate volume of blood received in culture bottles   Culture      NO GROWTH 5 DAYS Performed at [facility], [address] [place], [place] [address]    Report Status 01/18/2022 FINAL   Aerobic Culture w Gram Stain (superficial specimen)   Specimen: Wound  Result Value Ref Range   Specimen Description      WOUND Performed at [facility], [address] [place], [place] [address]    Special Requests      LEFT LEG Performed at [facility], [address] [place], [place] [address]    Gram Stain      NO WBC SEEN RARE Romie MinusGRAM NEGATIVE RODS RARE GRAM POSITIVE RODS Performed at [facility], [address] [place], [place] [address]    Culture MULTIPLE ORGANISMS PRESENT, NONE PREDOMINANT (A)    Report Status 01/20/2022 FINAL   Culture, blood (Routine X 2) w Reflex to ID Panel   Specimen: BLOOD  Result Value Ref Range   Specimen Description BLOOD BLH    Special Requests      BOTTLES DRAWN AEROBIC AND ANAEROBIC Blood Culture adequate volume   Culture      NO GROWTH 4 DAYS Performed at [facility], [address] [place], [place] [address]    Report Status PENDING   Culture, blood (Routine  X 2) w Reflex to ID Panel   Specimen: BLOOD  Result Value Ref Range   Specimen Description BLOOD BLOOD RIGHT HAND    Special Requests      BOTTLES DRAWN AEROBIC AND ANAEROBIC Blood Culture results may not be optimal due to an excessive volume of blood received in culture bottles   Culture      NO GROWTH 4 DAYS Performed at [facility], [address] [place], [place] [address]    Report Status PENDING   Basic metabolic panel  Result Value Ref Range   Sodium 137 135 - 145 mmol/L   Potassium 4.7 3.5 - 5.1 mmol/L   Chloride 103 98 - 111 mmol/L   CO2 30 22 - 32 mmol/L   Glucose, Bld  95 70 - 99 mg/dL   BUN 17 8 - 23 mg/dL   Creatinine, Ser 0.10 0.61 - 1.24 mg/dL   Calcium 8.4 (L) 8.9 - 10.3 mg/dL   GFR, Estimated >27 >25 mL/min   Anion gap 4 (L) 5 - 15  CBC with Differential  Result Value Ref Range   WBC 10.5 4.0 - 10.5 K/uL   RBC 4.60 4.22 - 5.81 MIL/uL   Hemoglobin 13.8 13.0 - 17.0 g/dL   HCT 36.6 44.0 - 34.7 %   MCV 95.7 80.0 - 100.0 fL   MCH 30.0 26.0 - 34.0 pg   MCHC 31.4 30.0 - 36.0 g/dL   RDW 42.5 95.6 - 38.7 %   Platelets 363 150 - 400 K/uL   nRBC 0.0 0.0 - 0.2 %   Neutrophils Relative % 67 %   Neutro Abs 7.0 1.7 - 7.7 K/uL   Lymphocytes Relative 18 %   Lymphs Abs 1.9 0.7 - 4.0 K/uL   Monocytes Relative 12 %   Monocytes Absolute 1.3 (H) 0.1 - 1.0 K/uL   Eosinophils Relative 2 %   Eosinophils Absolute 0.2 0.0 - 0.5 K/uL   Basophils Relative 0 %   Basophils Absolute 0.0 0.0 - 0.1 K/uL   Immature Granulocytes 1 %   Abs Immature Granulocytes 0.05 0.00 - 0.07 K/uL  Lactic acid, plasma  Result Value Ref Range   Lactic Acid, Venous 1.6 0.5 - 1.9 mmol/L  Lactic acid, plasma  Result Value Ref Range   Lactic Acid, Venous 1.4 0.5 - 1.9 mmol/L  Brain natriuretic peptide  Result Value Ref Range   B Natriuretic Peptide 185.2 (H) 0.0 - 100.0 pg/mL  Sedimentation rate  Result Value Ref Range   Sed Rate 22 (H) 0 - 20 mm/hr  Hepatic function panel  Result  Value Ref Range   Total Protein 6.2 (L) 6.5 - 8.1 g/dL   Albumin 3.2 (L) 3.5 - 5.0 g/dL   AST 29 15 - 41 U/L   ALT 17 0 - 44 U/L   Alkaline Phosphatase 64 38 - 126 U/L   Total Bilirubin 1.1 0.3 - 1.2 mg/dL   Bilirubin, Direct 0.4 (H) 0.0 - 0.2 mg/dL   Indirect Bilirubin 0.7 0.3 - 0.9 mg/dL  Creatinine, serum  Result Value Ref Range   Creatinine, Ser 0.84 0.61 - 1.24 mg/dL   GFR, Estimated >56 >43 mL/min  HIV Antibody (routine testing w rflx)  Result Value Ref Range   HIV Screen 4th Generation wRfx Non Reactive Non Reactive  Basic metabolic panel  Result Value Ref Range   Sodium 136 135 - 145 mmol/L   Potassium 4.1 3.5 - 5.1 mmol/L   Chloride 95 (L) 98 - 111 mmol/L   CO2 34 (H) 22 - 32 mmol/L   Glucose, Bld 94 70 - 99 mg/dL   BUN 24 (H) 8 - 23 mg/dL   Creatinine, Ser 3.29 0.61 - 1.24 mg/dL   Calcium 9.1 8.9 - 51.8 mg/dL   GFR, Estimated >84 >16 mL/min   Anion gap 7 5 - 15  CBC  Result Value Ref Range   WBC 8.6 4.0 - 10.5 K/uL   RBC 4.48 4.22 - 5.81 MIL/uL   Hemoglobin 13.3 13.0 - 17.0 g/dL   HCT 60.6 30.1 - 60.1 %   MCV 94.9 80.0 - 100.0 fL   MCH 29.7 26.0 - 34.0 pg   MCHC 31.3 30.0 - 36.0 g/dL   RDW 09.3 23.5 - 57.3 %  Platelets 358 150 - 400 K/uL   nRBC 0.0 0.0 - 0.2 %  Magnesium  Result Value Ref Range   Magnesium 2.1 1.7 - 2.4 mg/dL  Basic metabolic panel  Result Value Ref Range   Sodium 137 135 - 145 mmol/L   Potassium 3.9 3.5 - 5.1 mmol/L   Chloride 98 98 - 111 mmol/L   CO2 33 (H) 22 - 32 mmol/L   Glucose, Bld 123 (H) 70 - 99 mg/dL   BUN 19 8 - 23 mg/dL   Creatinine, Ser 2.42 0.61 - 1.24 mg/dL   Calcium 8.8 (L) 8.9 - 10.3 mg/dL   GFR, Estimated >68 >34 mL/min   Anion gap 6 5 - 15  CBC  Result Value Ref Range   WBC 10.7 (H) 4.0 - 10.5 K/uL   RBC 4.22 4.22 - 5.81 MIL/uL   Hemoglobin 12.7 (L) 13.0 - 17.0 g/dL   HCT 19.6 22.2 - 97.9 %   MCV 92.7 80.0 - 100.0 fL   MCH 30.1 26.0 - 34.0 pg   MCHC 32.5 30.0 - 36.0 g/dL   RDW 89.2 11.9 - 41.7 %    Platelets 358 150 - 400 K/uL   nRBC 0.0 0.0 - 0.2 %  Magnesium  Result Value Ref Range   Magnesium 2.1 1.7 - 2.4 mg/dL  Basic metabolic panel  Result Value Ref Range   Sodium 137 135 - 145 mmol/L   Potassium 4.0 3.5 - 5.1 mmol/L   Chloride 96 (L) 98 - 111 mmol/L   CO2 34 (H) 22 - 32 mmol/L   Glucose, Bld 107 (H) 70 - 99 mg/dL   BUN 14 8 - 23 mg/dL   Creatinine, Ser 4.08 0.61 - 1.24 mg/dL   Calcium 8.7 (L) 8.9 - 10.3 mg/dL   GFR, Estimated >14 >48 mL/min   Anion gap 7 5 - 15  CBC  Result Value Ref Range   WBC 13.7 (H) 4.0 - 10.5 K/uL   RBC 4.54 4.22 - 5.81 MIL/uL   Hemoglobin 13.7 13.0 - 17.0 g/dL   HCT 18.5 63.1 - 49.7 %   MCV 94.5 80.0 - 100.0 fL   MCH 30.2 26.0 - 34.0 pg   MCHC 31.9 30.0 - 36.0 g/dL   RDW 02.6 37.8 - 58.8 %   Platelets 399 150 - 400 K/uL   nRBC 0.0 0.0 - 0.2 %  Magnesium  Result Value Ref Range   Magnesium 2.1 1.7 - 2.4 mg/dL  Basic metabolic panel  Result Value Ref Range   Sodium 134 (L) 135 - 145 mmol/L   Potassium 4.1 3.5 - 5.1 mmol/L   Chloride 92 (L) 98 - 111 mmol/L   CO2 37 (H) 22 - 32 mmol/L   Glucose, Bld 99 70 - 99 mg/dL   BUN 16 8 - 23 mg/dL   Creatinine, Ser 5.02 0.61 - 1.24 mg/dL   Calcium 8.7 (L) 8.9 - 10.3 mg/dL   GFR, Estimated >77 >41 mL/min   Anion gap 5 5 - 15  CBC  Result Value Ref Range   WBC 14.6 (H) 4.0 - 10.5 K/uL   RBC 4.52 4.22 - 5.81 MIL/uL   Hemoglobin 13.7 13.0 - 17.0 g/dL   HCT 28.7 86.7 - 67.2 %   MCV 94.0 80.0 - 100.0 fL   MCH 30.3 26.0 - 34.0 pg   MCHC 32.2 30.0 - 36.0 g/dL   RDW 09.4 70.9 - 62.8 %   Platelets 369 150 - 400 K/uL  nRBC 0.0 0.0 - 0.2 %  Magnesium  Result Value Ref Range   Magnesium 2.0 1.7 - 2.4 mg/dL  Basic metabolic panel  Result Value Ref Range   Sodium 131 (L) 135 - 145 mmol/L   Potassium 4.0 3.5 - 5.1 mmol/L   Chloride 90 (L) 98 - 111 mmol/L   CO2 32 22 - 32 mmol/L   Glucose, Bld 177 (H) 70 - 99 mg/dL   BUN 22 8 - 23 mg/dL   Creatinine, Ser 1.61 0.61 - 1.24 mg/dL   Calcium 9.0  8.9 - 09.6 mg/dL   GFR, Estimated >04 >54 mL/min   Anion gap 9 5 - 15  CBC  Result Value Ref Range   WBC 16.4 (H) 4.0 - 10.5 K/uL   RBC 4.76 4.22 - 5.81 MIL/uL   Hemoglobin 14.2 13.0 - 17.0 g/dL   HCT 09.8 11.9 - 14.7 %   MCV 92.6 80.0 - 100.0 fL   MCH 29.8 26.0 - 34.0 pg   MCHC 32.2 30.0 - 36.0 g/dL   RDW 82.9 56.2 - 13.0 %   Platelets 433 (H) 150 - 400 K/uL   nRBC 0.0 0.0 - 0.2 %  Magnesium  Result Value Ref Range   Magnesium 2.0 1.7 - 2.4 mg/dL  Brain natriuretic peptide  Result Value Ref Range   B Natriuretic Peptide 122.2 (H) 0.0 - 100.0 pg/mL  Basic metabolic panel  Result Value Ref Range   Sodium 135 135 - 145 mmol/L   Potassium 4.2 3.5 - 5.1 mmol/L   Chloride 93 (L) 98 - 111 mmol/L   CO2 35 (H) 22 - 32 mmol/L   Glucose, Bld 106 (H) 70 - 99 mg/dL   BUN 18 8 - 23 mg/dL   Creatinine, Ser 8.65 0.61 - 1.24 mg/dL   Calcium 9.0 8.9 - 78.4 mg/dL   GFR, Estimated >69 >62 mL/min   Anion gap 7 5 - 15  CBC  Result Value Ref Range   WBC 11.7 (H) 4.0 - 10.5 K/uL   RBC 4.52 4.22 - 5.81 MIL/uL   Hemoglobin 13.6 13.0 - 17.0 g/dL   HCT 95.2 84.1 - 32.4 %   MCV 92.5 80.0 - 100.0 fL   MCH 30.1 26.0 - 34.0 pg   MCHC 32.5 30.0 - 36.0 g/dL   RDW 40.1 02.7 - 25.3 %   Platelets 398 150 - 400 K/uL   nRBC 0.0 0.0 - 0.2 %  Basic metabolic panel  Result Value Ref Range   Sodium 133 (L) 135 - 145 mmol/L   Potassium 4.0 3.5 - 5.1 mmol/L   Chloride 91 (L) 98 - 111 mmol/L   CO2 33 (H) 22 - 32 mmol/L   Glucose, Bld 90 70 - 99 mg/dL   BUN 21 8 - 23 mg/dL   Creatinine, Ser 6.64 0.61 - 1.24 mg/dL   Calcium 8.7 (L) 8.9 - 10.3 mg/dL   GFR, Estimated >40 >34 mL/min   Anion gap 9 5 - 15  CBC  Result Value Ref Range   WBC 11.8 (H) 4.0 - 10.5 K/uL   RBC 4.46 4.22 - 5.81 MIL/uL   Hemoglobin 13.1 13.0 - 17.0 g/dL   HCT 74.2 59.5 - 63.8 %   MCV 91.5 80.0 - 100.0 fL   MCH 29.4 26.0 - 34.0 pg   MCHC 32.1 30.0 - 36.0 g/dL   RDW 75.6 43.3 - 29.5 %   Platelets 410 (H) 150 - 400 K/uL   nRBC  0.0  0.0 - 0.2 %  Basic metabolic panel  Result Value Ref Range   Sodium 134 (L) 135 - 145 mmol/L   Potassium 4.3 3.5 - 5.1 mmol/L   Chloride 90 (L) 98 - 111 mmol/L   CO2 36 (H) 22 - 32 mmol/L   Glucose, Bld 94 70 - 99 mg/dL   BUN 24 (H) 8 - 23 mg/dL   Creatinine, Ser 1.61 0.61 - 1.24 mg/dL   Calcium 8.9 8.9 - 09.6 mg/dL   GFR, Estimated >04 >54 mL/min   Anion gap 8 5 - 15  Basic metabolic panel  Result Value Ref Range   Sodium 135 135 - 145 mmol/L   Potassium 4.6 3.5 - 5.1 mmol/L   Chloride 93 (L) 98 - 111 mmol/L   CO2 36 (H) 22 - 32 mmol/L   Glucose, Bld 103 (H) 70 - 99 mg/dL   BUN 18 8 - 23 mg/dL   Creatinine, Ser 0.98 0.61 - 1.24 mg/dL   Calcium 9.0 8.9 - 11.9 mg/dL   GFR, Estimated >14 >78 mL/min   Anion gap 6 5 - 15  Basic metabolic panel  Result Value Ref Range   Sodium 133 (L) 135 - 145 mmol/L   Potassium 4.0 3.5 - 5.1 mmol/L   Chloride 94 (L) 98 - 111 mmol/L   CO2 32 22 - 32 mmol/L   Glucose, Bld 105 (H) 70 - 99 mg/dL   BUN 15 8 - 23 mg/dL   Creatinine, Ser 2.95 0.61 - 1.24 mg/dL   Calcium 8.5 (L) 8.9 - 10.3 mg/dL   GFR, Estimated >62 >13 mL/min   Anion gap 7 5 - 15  CBC  Result Value Ref Range   WBC 10.9 (H) 4.0 - 10.5 K/uL   RBC 4.14 (L) 4.22 - 5.81 MIL/uL   Hemoglobin 12.1 (L) 13.0 - 17.0 g/dL   HCT 08.6 (L) 57.8 - 46.9 %   MCV 91.5 80.0 - 100.0 fL   MCH 29.2 26.0 - 34.0 pg   MCHC 31.9 30.0 - 36.0 g/dL   RDW 62.9 52.8 - 41.3 %   Platelets 391 150 - 400 K/uL   nRBC 0.0 0.0 - 0.2 %  ECHOCARDIOGRAM COMPLETE  Result Value Ref Range   Weight 3,887.15 oz   Height 70 in   BP 112/63 mmHg   Ao pk vel 1.69 m/s   AV Area VTI 1.41 cm2   AR max vel 1.54 cm2   AV Mean grad 6.0 mmHg   AV Peak grad 11.4 mmHg   Single Plane A2C EF 59.5 %   Single Plane A4C EF 71.0 %   Calc EF 66.9 %   S' Lateral 5.04 cm   AV Area mean vel 1.55 cm2   Area-P 1/2 2.74 cm2   MV VTI 1.19 cm2  Troponin I (High Sensitivity)  Result Value Ref Range   Troponin I (High Sensitivity)  7 <18 ng/L  Troponin I (High Sensitivity)  Result Value Ref Range   Troponin I (High Sensitivity) 7 <18 ng/L    Imaging:   I personally reviewed radiology studies to include:  01/20/22 MRI L-spine  IMPRESSION: 1. Acute/subacute compression fracture of the L2 vertebral body with approximately 30% height loss. No retropulsion. 2. Chronic compression fracture of the L4 vertebral body resulting in approximately 50% height loss without retropulsion. 3. Mild degenerative changes of the lumbar spine without high-grade spinal canal or neural foraminal stenosis at any level.     Electronically Signed  By: Baldemar Lenis M.D.   On: 01/20/2022 14:08    Impression/Plan:     1.  Diagnosis: L2 compression fracture with 30% height loss  2.  Plan - treat conservatively in LSO brace when patient ambulating  - no plan for acute neurosurgical intervention at this time - will follow up outpatient with lumbar xrays in 3-4 weeks - please call with any questions or concerns.  Manning Charity PA-C Neurosurgery

## 2022-01-24 NOTE — Evaluation (Signed)
Occupational Therapy Evaluation Patient Details Name: Erik [name] MRN: [id number] DOB: [birth year] Today's Date: 01/24/2022   History of Present Illness Erik [name] is a 66 y.o. male  with h/o HFrEF, COPD, HTN, CAD s/p MI, here with leg pain. Reports over the past week, he's had increasing bilateral, R>L swelling. R leg has gotten more painful and red, swollen as well. He has also had worsening cough, orthopnea, sputum production. Has had difficulty getting around due to his leg swelling and pain.   Clinical Impression   Erik [name] presents today with generalized weakness, limited endurance, mild-moderate pain, impaired balance, and SOB. He lives with his wife in a single-story home but with 9 steps to enter. He sponge bathes at baseline, uses a RW, is on 2-3L home O2, and reports that he rarely leaves his home, as the entrance steps are difficult for him to navigate -- he states he sometimes takes 30-45 minutes to get into/out of his house: he needs to take multiple rest breaks as he climbs/descends the stairs. During today's evaluation, after coming to EOB sitting with Mod A, pt becomes very SOB, O2 sats drop into mid 70s, on 2L O2. Increased O2 to 3L, provided education on slow, deep breathing techniques and relaxation. O2 sats slowly improve to 89%, w/ O2 increased finally to 4L. RN notified, breathing treatment ordered. Pt able to transfer very slowly but steadily to [facility] and recliner, w/ fair balance. Max A for peri-hygiene. Pt reports his pain is reduced today, endorsing 2-3/10 pain in R shoulder, lower back, and b/l LE. He displays b/l hand tremors throughout session. Pt states he wants to get home as soon as possible, but that he now understands that he needs to be breathing and moving better before he can live safely at home, and he is willing to DC to a SNF. Will provide ongoing OT while pt is hospitalized.    Recommendations for follow up therapy are one component of a multi-disciplinary  discharge planning process, led by the attending physician.  Recommendations may be updated based on patient status, additional functional criteria and insurance authorization.   Follow Up Recommendations  Skilled nursing-short term rehab (<3 hours/day)    Assistance Recommended at Discharge Frequent or constant Supervision/Assistance  Patient can return home with the following A lot of help with walking and/or transfers;A lot of help with bathing/dressing/bathroom;Assistance with cooking/housework;Help with stairs or ramp for entrance    Functional Status Assessment  Patient has had a recent decline in their functional status and demonstrates the ability to make significant improvements in function in a reasonable and predictable amount of time.  Equipment Recommendations  None recommended by OT    Recommendations for Other Services Other (comment) (outpatient pulmonary rehab referral?)     Precautions / Restrictions Precautions Precautions: Fall Restrictions Weight Bearing Restrictions: No      Mobility Bed Mobility Overal bed mobility: Needs Assistance Bed Mobility: Supine to Sit     Supine to sit: Mod assist     General bed mobility comments: greatly increased time and effort, with rest break needed    Transfers Overall transfer level: Needs assistance Equipment used: Rolling walker (2 wheels) Transfers: Sit to/from Stand Sit to Stand: From elevated surface, Min guard     Step pivot transfers: Min guard     General transfer comment: greatly increased time, but no physical assistance required      Balance Overall balance assessment: Needs assistance Sitting-balance support: Feet supported Sitting balance-Leahy Scale:  Good Sitting balance - Comments: pt sat EOB w/ good balance for 20+ minutes, waiting for O2 sats to increase above 85% prior to transfer sit<>stand to use BSC   Standing balance support: During functional activity, Reliant on assistive device for  balance Standing balance-Leahy Scale: Fair                             ADL either performed or assessed with clinical judgement   ADL Overall ADL's : Needs assistance/impaired Eating/Feeding: Modified independent                   Lower Body Dressing: Total assistance   Toilet Transfer: Moderate assistance   Toileting- Clothing Manipulation and Hygiene: Modified independent;Maximal assistance Toileting - Clothing Manipulation Details (indicate cue type and reason): Mod I for using urinal in sitting; Max A for peri-care             Vision         Perception     Praxis      Pertinent Vitals/Pain Pain Assessment Pain Assessment: 0-10 Pain Score: 3  Pain Location: b/l LEs, R shoulder, back Pain Descriptors / Indicators: Discomfort, Grimacing, Guarding, Moaning Pain Intervention(s): Limited activity within patient's tolerance, Monitored during session, Repositioned     Hand Dominance     Extremity/Trunk Assessment Upper Extremity Assessment Upper Extremity Assessment: Overall WFL for tasks assessed   Lower Extremity Assessment Lower Extremity Assessment: Generalized weakness LLE Sensation: WNL   Cervical / Trunk Assessment Cervical / Trunk Assessment: Normal   Communication Communication Communication: No difficulties   Cognition Arousal/Alertness: Awake/alert Behavior During Therapy: WFL for tasks assessed/performed Overall Cognitive Status: No family/caregiver present to determine baseline cognitive functioning                                       General Comments       Exercises Other Exercises Other Exercises: Educ re: breathing, relaxation techniques; energy conservation strategies, falls prevention   Shoulder Instructions      Home Living Family/patient expects to be discharged to:: Private residence Living Arrangements: Spouse/significant other Available Help at Discharge: Family;Available 24 hours/day Type  of Home: House Home Access: Stairs to enter [employer] of Steps: 9 Entrance Stairs-Rails: Can reach both;Right;Left Home Layout: One level     Bathroom Shower/Tub: Chief Strategy Officer: Handicapped height     Home Equipment: Agricultural consultant (2 wheels);BSC/3in1   Additional Comments: home oxygen, 2-3 L      Prior Functioning/Environment Prior Level of Function : Needs assist             Mobility Comments: Reliant on RW in household, reports he rarely leaves his house ADLs Comments: IND in toileting, takes sponge baths. Family handles IADLs        OT Problem List: Decreased strength;Decreased range of motion;Decreased activity tolerance;Impaired balance (sitting and/or standing);Pain;Cardiopulmonary status limiting activity;Increased edema;Obesity      OT Treatment/Interventions: Self-care/ADL training;Patient/family education;Balance training;Energy conservation;Therapeutic activities;DME and/or AE instruction    OT Goals(Current goals can be found in the care plan section) Acute Rehab OT Goals Patient Stated Goal: to get home OT Goal Formulation: With patient Time For Goal Achievement: 02/07/22 Potential to Achieve Goals: Good ADL Goals Pt Will Transfer to Toilet: with supervision;regular height toilet (using LRAD) Pt Will Perform Toileting - Clothing Manipulation and hygiene:  with min assist;sitting/lateral leans Additional ADL Goal #1: Pt will ID/demonstrate 2+ energy conservation strategies  OT Frequency: Min 2X/week    Co-evaluation              AM-PAC OT "6 Clicks" Daily Activity     Outcome Measure Help from another person eating meals?: None Help from another person taking care of personal grooming?: A Little Help from another person toileting, which includes using toliet, bedpan, or urinal?: A Lot Help from another person bathing (including washing, rinsing, drying)?: A Lot Help from another person to put on and taking off  regular upper body clothing?: A Little Help from another person to put on and taking off regular lower body clothing?: A Lot 6 Click Score: 16   End of Session Equipment Utilized During Treatment: Rolling walker (2 wheels)  Activity Tolerance: Patient tolerated treatment well;Other (comment) (Pt limited by SOB) Patient left: in chair;with nursing/sitter in room;with call bell/phone within reach  OT Visit Diagnosis: Unsteadiness on feet (R26.81);Muscle weakness (generalized) (M62.81)                Time: 1497-0263 OT Time Calculation (min): 58 min Charges:  OT Evaluation $OT Eval Moderate Complexity: 1 Mod OT Treatments $Self Care/Home Management : 53-67 mins Latina Craver, PhD, MS, OTR/L 01/24/22, 11:04 AM

## 2022-01-24 NOTE — Progress Notes (Signed)
[facility] PROGRESS NOTE    Progress Note  Erik [name]  [id number] DOB: 09/14/1955 DOA: 01/13/2022 PCP: Jonetta Osgood, NP     Brief Narrative:   Erik [name] is an 66 y.o. male past medical history significant for CAD status post PCI with chronic systolic heart failure by echo on February 2022 with an EF of 40%, permanent atrial fibrillation on aspirin (not on anticoagulation due to preference) COPD on home oxygen, chronic venous stasis who saw his cardiologist in May 2023 was complaining of increased fluid retention and shortness of breath at that time.  We will start her on torsemide and increase twice a day comes into the ED with chronic lower extremity swelling associated with dyspnea on exertion.  He eventually had a mechanical fall 5 days prior to admission which reduces mobility.  Patient is awaiting skilled nursing facility placement.   Assessment/Plan:   Bilateral cellulitis of lower leg Blood cultures on admission 01/13/2022 were negative. Has remained afebrile leukocytosis is improved. We will transition him to oral Bactrim, will need to have basic metabolic panel checked in 1 week. Creatinine has remained stable in house. Surveillance blood culture 01/19/2022 remain negative  Acute on chronic systolic heart failure: His estimated dry weight now in the hospital is around 109 kg. Diuretics have been held, he was continued on Cozaar. Diet has been liberalized.   Continue strict I's and O's and daily weight.  Ulcer of extremity due to chronic venous insufficiency (HCC) Continue current dressing changes per nurse.  Ambulatory dysfunction Multifactorial related to venous insufficiency and deconditioning in the setting of heart failure and COPD which required oxygen with a recent fall. Physical therapy evaluated the patient recommended skilled nursing facility. TOC involved patient has agreed to go to skilled nursing facility.  Chronic atrial  fibrillation: Was seen by cardiology in May 2023. Not interested on anticoagulation currently on aspirin 325 and Lopressor.  Coronary artery disease: Chest pain-free continue aspirin resume statins after discharge.  Left hip pain: X-ray of the hip showed bilateral osteoarthritis left greater than right. Continue tramadol and gabapentin. MRI of the hip showed no evidence of acute fracture severe osteoarthritis of both hip worse on the left.  Acute/subacute compression over to vertebral body approximately 30% height loss, MRI was done that showed compression fraction of L2 no retropulsion. Chronic compression of L4. Neuro was consulted recommended TLSO brace. Pain is controlled with narcotics.  DVT prophylaxis: lovenox Family Communication:none Status is: Inpatient Remains inpatient appropriate because: Cellulitis will need skilled nursing facility placement.    Code Status:     Code Status Orders  (From admission, onward)           Start     Ordered   01/13/22 2115  Full code  Continuous        01/13/22 2117           Code Status History     Date Active Date Inactive Code Status Order ID Comments User Context   08/14/2017 0126 08/19/2017 1610 Full Code [id number]  Amelia Jo, MD Inpatient         IV Access:   Peripheral IV   Procedures and diagnostic studies:   No results found.   Medical Consultants:   None.   Subjective:    Erik [name] pain is improved.  Objective:    Vitals:   01/23/22 2037 01/24/22 0324 01/24/22 0431 01/24/22 0831  BP: 127/79  118/75 135/77  Pulse: (!) 107  87 85  Resp: 18  18 18   Temp: 98.2 F (36.8 C)  98.1 F (36.7 C) 98 F (36.7 C)  TempSrc:    Oral  SpO2: 92%  93% 93%  Weight:  109.7 kg    Height:       SpO2: 93 % O2 Flow Rate (L/min): 2 L/min   Intake/Output Summary (Last 24 hours) at 01/24/2022 1002 Last data filed at 01/24/2022 0835 Gross per 24 hour  Intake 757.63 ml  Output 2700 ml  Net  -1942.37 ml    Filed Weights   01/22/22 0500 01/23/22 0500 01/24/22 0324  Weight: 108.2 kg 108.7 kg 109.7 kg    Exam: General exam: In no acute distress. Respiratory system: Good air movement and clear to auscultation. Cardiovascular system: S1 & S2 heard, RRR. No JVD. Gastrointestinal system: Abdomen is nondistended, soft and nontender.  Extremities: No pedal edema. Skin: No rashes, lesions or ulcers Psychiatry: Judgement and insight appear normal. Mood & affect appropriate.  Data Reviewed:    Labs: Basic Metabolic Panel: Recent Labs  Lab 01/18/22 0552 01/19/22 0431 01/20/22 0450 01/21/22 0629 01/22/22 0453 01/23/22 0446 01/24/22 0418  NA 134* 131* 135 133* 134* 135 133*  K 4.1 4.0 4.2 4.0 4.3 4.6 4.0  CL 92* 90* 93* 91* 90* 93* 94*  CO2 37* 32 35* 33* 36* 36* 32  GLUCOSE 99 177* 106* 90 94 103* 105*  BUN 16 22 18 21  24* 18 15  CREATININE 0.71 0.82 0.65 0.67 0.91 0.85 0.76  CALCIUM 8.7* 9.0 9.0 8.7* 8.9 9.0 8.5*  MG 2.0 2.0  --   --   --   --   --     GFR Estimated Creatinine Clearance: 111.1 mL/min (by C-G formula based on SCr of 0.76 mg/dL). Liver Function Tests: No results for input(s): "AST", "ALT", "ALKPHOS", "BILITOT", "PROT", "ALBUMIN" in the last 168 hours.  No results for input(s): "LIPASE", "AMYLASE" in the last 168 hours. No results for input(s): "AMMONIA" in the last 168 hours. Coagulation profile No results for input(s): "INR", "PROTIME" in the last 168 hours. COVID-19 Labs  No results for input(s): "DDIMER", "FERRITIN", "LDH", "CRP" in the last 72 hours.  No results found for: "SARSCOV2NAA"  CBC: Recent Labs  Lab 01/18/22 0552 01/19/22 0431 01/20/22 0450 01/21/22 0629 01/24/22 0418  WBC 14.6* 16.4* 11.7* 11.8* 10.9*  HGB 13.7 14.2 13.6 13.1 12.1*  HCT 42.5 44.1 41.8 40.8 37.9*  MCV 94.0 92.6 92.5 91.5 91.5  PLT 369 433* 398 410* 391    Cardiac Enzymes: No results for input(s): "CKTOTAL", "CKMB", "CKMBINDEX", "TROPONINI" in the  last 168 hours. BNP (last 3 results) No results for input(s): "PROBNP" in the last 8760 hours. CBG: No results for input(s): "GLUCAP" in the last 168 hours. D-Dimer: No results for input(s): "DDIMER" in the last 72 hours. Hgb A1c: No results for input(s): "HGBA1C" in the last 72 hours. Lipid Profile: No results for input(s): "CHOL", "HDL", "LDLCALC", "TRIG", "CHOLHDL", "LDLDIRECT" in the last 72 hours. Thyroid function studies: No results for input(s): "TSH", "T4TOTAL", "T3FREE", "THYROIDAB" in the last 72 hours.  Invalid input(s): "FREET3" Anemia work up: No results for input(s): "VITAMINB12", "FOLATE", "FERRITIN", "TIBC", "IRON", "RETICCTPCT" in the last 72 hours. Sepsis Labs: Recent Labs  Lab 01/19/22 0431 01/20/22 0450 01/21/22 0629 01/24/22 0418  WBC 16.4* 11.7* 11.8* 10.9*    Microbiology Recent Results (from the past 240 hour(s))  Aerobic Culture w Gram Stain (superficial specimen)     Status: Abnormal   Collection  Time: 01/17/22 10:00 AM   Specimen: Wound  Result Value Ref Range Status   Specimen Description   Final    WOUND Performed at [facility], [address] [place], [place] [address]    Special Requests   Final    LEFT LEG Performed at [facility], [address] [place], [place] [address]    Gram Stain   Final    NO WBC SEEN RARE Romie Minus NEGATIVE RODS RARE GRAM POSITIVE RODS Performed at [facility], [address], [place], [place] [address]    Culture MULTIPLE ORGANISMS PRESENT, NONE PREDOMINANT (A)  Final   Report Status 01/20/2022 FINAL  Final  Culture, blood (Routine X 2) w Reflex to ID Panel     Status: None (Preliminary result)   Collection Time: 01/19/22  7:48 AM   Specimen: BLOOD  Result Value Ref Range Status   Specimen Description BLOOD [facility]  Final   Special Requests   Final    BOTTLES DRAWN AEROBIC AND ANAEROBIC Blood Culture adequate volume   Culture   Final    NO GROWTH 4 DAYS Performed at [facility], [address], [place], [place] [address]    Report Status PENDING  Incomplete  Culture, blood (Routine X 2) w Reflex to ID Panel     Status: None (Preliminary result)   Collection Time: 01/19/22  7:59 AM   Specimen: BLOOD  Result Value Ref Range Status   Specimen Description BLOOD BLOOD RIGHT HAND  Final   Special Requests   Final    BOTTLES DRAWN AEROBIC AND ANAEROBIC Blood Culture results may not be optimal due to an excessive volume of blood received in culture bottles   Culture   Final    NO GROWTH 4 DAYS Performed at [facility], [address] [place], [place] [address]    Report Status PENDING  Incomplete     Medications:    aspirin EC  325 mg Oral Daily   docusate sodium  100 mg Oral Daily   enoxaparin (LOVENOX) injection  0.5 mg/kg Subcutaneous Q24H   fluticasone furoate-vilanterol  1 puff Inhalation Daily   gabapentin  300 mg Oral TID   losartan  25 mg Oral Daily   metoprolol tartrate  25 mg Oral BID   rosuvastatin  10 mg Oral Daily   saccharomyces boulardii  250 mg Oral BID   sulfamethoxazole-trimethoprim  1 tablet Oral Q12H   umeclidinium bromide  1 puff Inhalation Daily   Continuous Infusions:     LOS: 11 days   Marinda Elk  [facility]  01/24/2022, 10:02 AM

## 2022-01-25 ENCOUNTER — Other Ambulatory Visit: Payer: Self-pay | Admitting: Internal Medicine

## 2022-01-25 DIAGNOSIS — J9611 Chronic respiratory failure with hypoxia: Secondary | ICD-10-CM | POA: Diagnosis not present

## 2022-01-25 DIAGNOSIS — L98499 Non-pressure chronic ulcer of skin of other sites with unspecified severity: Secondary | ICD-10-CM | POA: Diagnosis not present

## 2022-01-25 DIAGNOSIS — M6281 Muscle weakness (generalized): Secondary | ICD-10-CM | POA: Diagnosis not present

## 2022-01-25 DIAGNOSIS — I5022 Chronic systolic (congestive) heart failure: Secondary | ICD-10-CM

## 2022-01-25 DIAGNOSIS — Z743 Need for continuous supervision: Secondary | ICD-10-CM | POA: Diagnosis not present

## 2022-01-25 DIAGNOSIS — J45909 Unspecified asthma, uncomplicated: Secondary | ICD-10-CM | POA: Diagnosis not present

## 2022-01-25 DIAGNOSIS — M25552 Pain in left hip: Secondary | ICD-10-CM | POA: Diagnosis not present

## 2022-01-25 DIAGNOSIS — Z7401 Bed confinement status: Secondary | ICD-10-CM | POA: Diagnosis not present

## 2022-01-25 DIAGNOSIS — E782 Mixed hyperlipidemia: Secondary | ICD-10-CM | POA: Diagnosis not present

## 2022-01-25 DIAGNOSIS — R531 Weakness: Secondary | ICD-10-CM | POA: Diagnosis not present

## 2022-01-25 DIAGNOSIS — R2689 Other abnormalities of gait and mobility: Secondary | ICD-10-CM | POA: Diagnosis not present

## 2022-01-25 DIAGNOSIS — S22040D Wedge compression fracture of fourth thoracic vertebra, subsequent encounter for fracture with routine healing: Secondary | ICD-10-CM | POA: Diagnosis not present

## 2022-01-25 DIAGNOSIS — I872 Venous insufficiency (chronic) (peripheral): Secondary | ICD-10-CM | POA: Diagnosis not present

## 2022-01-25 DIAGNOSIS — K5900 Constipation, unspecified: Secondary | ICD-10-CM | POA: Diagnosis not present

## 2022-01-25 DIAGNOSIS — L03115 Cellulitis of right lower limb: Secondary | ICD-10-CM | POA: Diagnosis not present

## 2022-01-25 DIAGNOSIS — R5381 Other malaise: Secondary | ICD-10-CM | POA: Diagnosis not present

## 2022-01-25 DIAGNOSIS — R262 Difficulty in walking, not elsewhere classified: Secondary | ICD-10-CM

## 2022-01-25 DIAGNOSIS — J449 Chronic obstructive pulmonary disease, unspecified: Secondary | ICD-10-CM | POA: Diagnosis not present

## 2022-01-25 DIAGNOSIS — I70213 Atherosclerosis of native arteries of extremities with intermittent claudication, bilateral legs: Secondary | ICD-10-CM | POA: Diagnosis not present

## 2022-01-25 DIAGNOSIS — I482 Chronic atrial fibrillation, unspecified: Secondary | ICD-10-CM | POA: Diagnosis not present

## 2022-01-25 DIAGNOSIS — E161 Other hypoglycemia: Secondary | ICD-10-CM | POA: Diagnosis not present

## 2022-01-25 DIAGNOSIS — E162 Hypoglycemia, unspecified: Secondary | ICD-10-CM | POA: Diagnosis not present

## 2022-01-25 DIAGNOSIS — Z9181 History of falling: Secondary | ICD-10-CM | POA: Diagnosis not present

## 2022-01-25 DIAGNOSIS — M549 Dorsalgia, unspecified: Secondary | ICD-10-CM | POA: Diagnosis not present

## 2022-01-25 DIAGNOSIS — I83028 Varicose veins of left lower extremity with ulcer other part of lower leg: Secondary | ICD-10-CM | POA: Diagnosis not present

## 2022-01-25 DIAGNOSIS — L03116 Cellulitis of left lower limb: Secondary | ICD-10-CM | POA: Diagnosis not present

## 2022-01-25 DIAGNOSIS — I10 Essential (primary) hypertension: Secondary | ICD-10-CM | POA: Diagnosis not present

## 2022-01-25 DIAGNOSIS — L97509 Non-pressure chronic ulcer of other part of unspecified foot with unspecified severity: Secondary | ICD-10-CM | POA: Diagnosis not present

## 2022-01-25 DIAGNOSIS — I2510 Atherosclerotic heart disease of native coronary artery without angina pectoris: Secondary | ICD-10-CM | POA: Diagnosis not present

## 2022-01-25 DIAGNOSIS — I70219 Atherosclerosis of native arteries of extremities with intermittent claudication, unspecified extremity: Secondary | ICD-10-CM | POA: Diagnosis not present

## 2022-01-25 DIAGNOSIS — R1312 Dysphagia, oropharyngeal phase: Secondary | ICD-10-CM | POA: Diagnosis not present

## 2022-01-25 DIAGNOSIS — B370 Candidal stomatitis: Secondary | ICD-10-CM | POA: Diagnosis not present

## 2022-01-25 DIAGNOSIS — G629 Polyneuropathy, unspecified: Secondary | ICD-10-CM | POA: Diagnosis not present

## 2022-01-25 DIAGNOSIS — L853 Xerosis cutis: Secondary | ICD-10-CM | POA: Diagnosis not present

## 2022-01-25 DIAGNOSIS — R2681 Unsteadiness on feet: Secondary | ICD-10-CM | POA: Diagnosis not present

## 2022-01-25 DIAGNOSIS — K219 Gastro-esophageal reflux disease without esophagitis: Secondary | ICD-10-CM | POA: Diagnosis not present

## 2022-01-25 LAB — CULTURE, BLOOD (ROUTINE X 2)
Culture: NO GROWTH
Culture: NO GROWTH
Special Requests: ADEQUATE

## 2022-01-25 MED ORDER — TRAMADOL HCL 50 MG PO TABS: 100.0000 mg | ORAL_TABLET | Freq: Four times a day (QID) | ORAL | 0 refills | Status: AC | PRN

## 2022-01-25 MED ORDER — TRAMADOL HCL 50 MG PO TABS: 100.0000 mg | ORAL_TABLET | Freq: Four times a day (QID) | ORAL | 0 refills | Status: DC | PRN

## 2022-01-25 MED ORDER — SULFAMETHOXAZOLE-TRIMETHOPRIM 800-160 MG PO TABS
1.0000 | ORAL_TABLET | Freq: Two times a day (BID) | ORAL | 0 refills | Status: AC
Start: 2022-01-25 — End: 2022-01-29

## 2022-01-25 NOTE — Care Management Important Message (Signed)
Important Message  Patient Details  Name: Erik [name] MRN: [id number] Date of Birth: Oct 24, 1955   Medicare Important Message Given:  Yes     Erik [name] January 01/25/2022, 10:52 AM

## 2022-01-25 NOTE — Progress Notes (Signed)
Report called to AJ at [facility]. Ivs removed intact. VSS. Education sent with pt and given to RN at [employer]. All belongings sent with pt.

## 2022-01-25 NOTE — Progress Notes (Signed)
Orthopedic Tech Progress Note Patient Details:  Erik [name] 28-Jun-1956 [id number]  Secretary called requesting a LSO BRACE for patient that was supposed to be ordered yesterday morning, so I called order to [facility] and made it STAT   Patient ID: Erik [name], male   DOB: 1956/01/21, 66 y.o.   MRN: [id number]  Erik [name] 01/25/2022, 9:29 AM

## 2022-01-25 NOTE — TOC Transition Note (Signed)
Transition of Care [facility]) - CM/SW Discharge Note   Patient Details  Name: Erik [name] MRN: [id number] Date of Birth: 03/16/1956  Transition of Care [facility]) CM/SW Contact:  Gildardo Griffes, LCSW Phone Number: 01/25/2022, 11:18 AM   Clinical Narrative:     Patient will DC to: [facility] Anticipated DC date: 01/25/22 Transport by: Wendie Simmer  Per MD patient ready for DC to [facility] . RN, patient, patient's family, and facility notified of DC. Discharge Summary sent to facility. RN given number for report [phone] Room E-1. DC packet on chart. Ambulance transport requested for patient.  CSW signing off.  Angeline Slim, LCSW    Final next level of care: Skilled Nursing Facility Barriers to Discharge: No Barriers Identified   Patient Goals and CMS Choice Patient states their goals for this hospitalization and ongoing recovery are:: to go home CMS Medicare.gov Compare Post Acute Care list provided to:: Patient Choice offered to / list presented to : Patient  Discharge Placement              Patient [facility] bed at:  [facility]) Patient to be transferred to facility by: [facility]   Patient and family notified of of transfer: 01/25/22  Discharge Plan and Services                                     Social Determinants of Health (SDOH) Interventions     Readmission Risk Interventions     No data to display

## 2022-01-25 NOTE — Discharge Summary (Signed)
Physician Discharge Summary  Havard Radigan [name] WIO:[id number] DOB: 01-Mar-1956 DOA: 01/13/2022  PCP: Sallyanne Kuster, NP  Admit date: 01/13/2022 Discharge date: 01/25/2022  Admitted From: Home Disposition:  SNF  Recommendations for Outpatient Follow-up:  Follow up with PCP in 1-2 weeks Please obtain BMP/CBC in one week   Home Health:No Equipment/Devices:None  Discharge Condition:Stable CODE STATUS:Full Diet recommendation: Heart Healthy  Brief/Interim Summary: 66 y.o. male past medical history significant for CAD status post PCI with chronic systolic heart failure by echo on February 2022 with an EF of 40%, permanent atrial fibrillation on aspirin (not on anticoagulation due to preference) COPD on home oxygen, chronic venous stasis who saw his cardiologist in May 2023 was complaining of increased fluid retention and shortness of breath at that time.  We will start her on torsemide and increase twice a day comes into the ED with chronic lower extremity swelling associated with dyspnea on exertion.  He eventually had a mechanical fall 5 days prior to admission which reduces mobility.    Discharge Diagnoses:  Principal Problem:   Bilateral cellulitis of lower leg Active Problems:   Ulcer of extremity due to chronic venous insufficiency (HCC)   Ambulatory dysfunction   Chronic systolic CHF (congestive heart failure) (HCC)   CAD (coronary artery disease)   Chronic a-fib (HCC)   COPD (chronic obstructive pulmonary disease) (HCC)   Chronic respiratory failure with hypoxia (HCC)   Left hip pain  Bilateral lower extremity cellulitis: He was started empirically on admission antibiotics blood cultures have remained negative till date. Wound care was consulted currently that dressing changes which will be continued at facility. He was transitioned to oral Bactrim which should continue for 4 more days as an outpatient. Surveillance blood culture on 01/19/2022 remain negative till  date.  Acute on chronic systolic heart failure: His estimated dry weight is around 109. He was started on aggressive IV diuresis on admission he diuresed over 16 L. He returned to his estimated dry weight. We will continue current home dose of diuretic regimen. We will try to encourage and make him understand that he needs to be fluid restricted.  Ulcer of the lower extremity with chronic venous insufficiency: Continue dressing changes per wound care nurse.  Ambulatory dysfunction: Multifactorial related to venous insufficiency deconditioning COPD requiring 2 L of oxygen, and new L4 compression fracture. Physical therapy evaluated the patient recommended skilled nursing facility.  Chronic atrial fibrillation: No change made to his medication continue current regimen he is not interested in anticoagulation continue aspirin 325 and Lopressor.  Coronary artery disease: Chest pain-free continue aspirin and statins.  Left hip pain: X-ray of the hip showed bilateral osteoarthritis MRI showed no evidence of fracture but today showed prominent loose body anteriorly on the left hip. Orthopedic surgery was consulted recommended conservative management until he has finished his antibiotic regimen and his wounds have healed he will follow-up with them as an outpatient in 2 to 4 weeks.  New acute/subacute compression L4 fracture with approximately 30% height loss: He was started on narcotics MRI was done that showed new compression fraction of L4. Neurosurgery was consulted and recommended conservative management with TLCO and pain control. He will go to skilled nursing facility on tramadol.      Discharge Instructions  Discharge Instructions     Diet - low sodium heart healthy   Complete by: As directed    Increase activity slowly   Complete by: As directed    No wound care   Complete by:  As directed       Allergies as of 01/25/2022       Reactions   Benadryl [diphenhydramine  Hcl] Shortness Of Breath   Morphine And Related Other (See Comments)   Difficulty breathing per patient         Medication List     STOP taking these medications    sulfamethoxazole-trimethoprim 800-160 MG tablet Commonly known as: BACTRIM DS       TAKE these medications    aspirin EC 325 MG tablet Take 325 mg by mouth daily.   collagenase 250 UNIT/GM ointment Commonly known as: SANTYL Apply 1 application with unna wrap changes   ipratropium-albuterol 0.5-2.5 (3) MG/3ML Soln Commonly known as: DUONEB USE 1 VIAL VIA NEBULIZER EVERY 6 HOURS   losartan 25 MG tablet Commonly known as: COZAAR Take 1 tablet (25 mg) by mouth once daily   metoprolol tartrate 25 MG tablet Commonly known as: LOPRESSOR TAKE TWO TABLETS BY MOUTH EVERY MORNING and TAKE TWO TABLETS BY MOUTH EVERY EVENING   OXYGEN Inhale 2-3 L into the lungs daily.   rosuvastatin 10 MG tablet Commonly known as: CRESTOR   torsemide 10 MG tablet Commonly known as: DEMADEX TAKE ONE TABLET BY MOUTH ONCE DAILY   traMADol 50 MG tablet Commonly known as: ULTRAM Take 2 tablets (100 mg total) by mouth every 6 (six) hours as needed for up to 3 days for moderate pain or severe pain.   Trelegy Ellipta 100-62.5-25 MCG/ACT Aepb Generic drug: Fluticasone-Umeclidin-Vilant Inhale 1 puff into the lungs daily.        Allergies  Allergen Reactions   Benadryl [Diphenhydramine Hcl] Shortness Of Breath   Morphine And Related Other (See Comments)    Difficulty breathing per patient     Consultations: Orthopedic surgery Neurosurgery   Procedures/Studies: MR LUMBAR SPINE WO CONTRAST  Result Date: 01/20/2022 CLINICAL DATA:  Low back pain, chronic. EXAM: MRI LUMBAR SPINE WITHOUT CONTRAST TECHNIQUE: Multiplanar, multisequence MR imaging of the lumbar spine was performed. No intravenous contrast was administered. COMPARISON:  None Available. FINDINGS: Segmentation:  Standard. Alignment:  Physiologic. Vertebrae:  Compression fracture of the L2 superior endplate resulting in loss of approximately 30% vertebral body height with associated marrow edema, consistent with acute/subacute fracture. There is no retropulsion. Chronic compression fracture of the L4 vertebral body with approximately 50% height loss height loss and no significant retropulsion. No evidence of discitis or aggressive bone lesion. Conus medullaris and cauda equina: Conus extends to the L1-2 level. Conus and cauda equina appear normal. Paraspinal and other soft tissues: Negative. Disc levels: T12-L1: No spinal canal or neural foraminal stenosis. L1-2: No spinal canal or neural foraminal stenosis. L2-3: Mild facet degenerative changes. No spinal canal or neural foraminal stenosis. L3-4: Disc bulge and mild facet degenerative changes without significant spinal canal or neural foraminal stenosis. L4-5: Shallow disc bulge and mild facet degenerative changes without significant spinal canal or neural foraminal stenosis. L5-S1: Mild facet degenerative changes. No spinal canal or neural foraminal stenosis. IMPRESSION: 1. Acute/subacute compression fracture of the L2 vertebral body with approximately 30% height loss. No retropulsion. 2. Chronic compression fracture of the L4 vertebral body resulting in approximately 50% height loss without retropulsion. 3. Mild degenerative changes of the lumbar spine without high-grade spinal canal or neural foraminal stenosis at any level. Electronically Signed   By: Baldemar Lenis M.D.   On: 01/20/2022 14:08   MR HIP LEFT W WO CONTRAST  Result Date: 01/19/2022 CLINICAL DATA:  Left hip  pain. Worsening chronic left lower extremity swelling with recent fall. History of heart failure. EXAM: MRI OF THE LEFT HIP WITHOUT AND WITH CONTRAST TECHNIQUE: Multiplanar, multisequence MR imaging was performed both before and after administration of intravenous contrast. CONTRAST:  10mL GADAVIST GADOBUTROL 1 MMOL/ML IV SOLN  COMPARISON:  Left hip radiographs 01/17/2022. Abdominal CT 03/25/2016. FINDINGS: Despite efforts by the technologist and patient, mild-to-moderate motion artifact is present on today's exam and could not be eliminated. This reduces exam sensitivity and specificity. Bones: There is no evidence of acute fracture, dislocation or femoral head osteonecrosis. As demonstrated radiographically, there is severe osteoarthritis of both hips, worse on the left. The visualized sacroiliac joints and symphysis pubis appear normal. There is a nonacute superior endplate compression deformity at L4 without associated bone marrow edema. Articular cartilage and labrum Articular cartilage: Severe osteoarthritis of both hips, worse on the left. There are prominent subchondral cysts within the left acetabulum. Labrum: Bilateral acetabular labral degeneration. Joint or bursal effusion Joint effusion: No significant knee joint effusion. As suggested radiographically, there is loose body anteriorly in the left hip measuring up to 1.9 cm on image 20/5. Bursae: No periarticular fluid collection or suspicious soft tissue enhancement. Muscles and tendons Muscles and tendons: Mild generalized muscular atrophy. No suspicious muscular enhancement. The gluteus, hamstring and iliopsoas tendons appear intact. Other findings Miscellaneous: Small right inguinal hernia containing only fat. The visualized internal pelvic contents otherwise appear unremarkable. IMPRESSION: 1. No evidence of acute fracture or dislocation. 2. Severe osteoarthritis of both hips, worse on the left. Associated prominent loose body anteriorly on the left. 3. No focal soft tissue abnormalities or abnormal enhancement. Generalized muscular atrophy. Electronically Signed   By: Carey Bullocks M.D.   On: 01/19/2022 08:02   DG HIP UNILAT WITH PELVIS 2-3 VIEWS LEFT  Result Date: 01/17/2022 CLINICAL DATA:  Chronic hip pain. EXAM: DG HIP (WITH OR WITHOUT PELVIS) 2-3V LEFT  COMPARISON:  None Available. FINDINGS: There is diffuse decreased bone mineralization. Severe bilateral femoroacetabular joint space narrowing with diffuse bone-on-bone contact, subchondral sclerosis and cystic change. Mild-to-moderate right and moderate left superior femoral head and superior acetabular cortical flattening/remodeling. There appears to be an approximate 2.4 cm ossicle/loose body within the inferior left femoroacetabular joint. Likely bilateral sacroiliac joint space narrowing. Within the limitations of diffuse decreased bone mineralization, no acute fracture is seen. Mild vascular calcifications. IMPRESSION: Markedly severe bilateral femoroacetabular osteoarthritis with left-greater-than-right femoral head bone erosion. Electronically Signed   By: Neita Garnet M.D.   On: 01/17/2022 15:51   ECHOCARDIOGRAM COMPLETE  Result Date: 01/14/2022    ECHOCARDIOGRAM REPORT   Patient Name:   Erik [name] Date of Exam: 01/14/2022 Medical Rec #:  [id number]      Height:       70.0 in Accession #:    [id number]     Weight:       242.9 lb Date of Birth:  1955/08/14     BSA:          2.267 m Patient Age:    65 years       BP:           129/81 mmHg Patient Gender: M              HR:           83 bpm. Exam Location:  [facility] Procedure: 2D Echo, Color Doppler, Cardiac Doppler and Intracardiac            Opacification Agent Indications:  I50.31 congestive heart failure-Acute Diastolic  History:         Patient has prior history of Echocardiogram examinations. CAD,                  COPD; Risk Factors:Hypertension.  Sonographer:     Humphrey Rolls Referring Phys:  [id number] Andris Baumann Diagnosing Phys: Julien Nordmann MD  Sonographer Comments: No parasternal window. Image acquisition challenging due to patient body habitus and Image acquisition challenging due to COPD. IMPRESSIONS  1. Challenging images  2. Left ventricular ejection fraction, by estimation, is 45 to 50%. The left ventricle has mildly decreased function.  The left ventricle has no regional wall motion abnormalities. Left ventricular diastolic parameters are indeterminate.  3. Right ventricular systolic function is normal. The right ventricular size is normal. Tricuspid regurgitation signal is inadequate for assessing PA pressure.  4. The mitral valve is normal in structure. Mild mitral valve regurgitation. No evidence of mitral stenosis.  5. The aortic valve was not well visualized. Aortic valve regurgitation is not visualized. No aortic stenosis is present.  6. The inferior vena cava is normal in size with greater than 50% respiratory variability, suggesting right atrial pressure of 3 mmHg. FINDINGS  Left Ventricle: Left ventricular ejection fraction, by estimation, is 45 to 50%. The left ventricle has mildly decreased function. The left ventricle has no regional wall motion abnormalities. Definity contrast agent was given IV to delineate the left ventricular endocardial borders. The left ventricular internal cavity size was normal in size. There is no left ventricular hypertrophy. Left ventricular diastolic parameters are indeterminate. Right Ventricle: The right ventricular size is normal. No increase in right ventricular wall thickness. Right ventricular systolic function is normal. Tricuspid regurgitation signal is inadequate for assessing PA pressure. Left Atrium: Left atrial size was normal in size. Right Atrium: Right atrial size was normal in size. Pericardium: There is no evidence of pericardial effusion. Mitral Valve: The mitral valve is normal in structure. Mild mitral valve regurgitation. No evidence of mitral valve stenosis. MV peak gradient, 6.0 mmHg. The mean mitral valve gradient is 2.0 mmHg. Tricuspid Valve: The tricuspid valve is normal in structure. Tricuspid valve regurgitation is mild . No evidence of tricuspid stenosis. Aortic Valve: The aortic valve was not well visualized. Aortic valve regurgitation is not visualized. No aortic stenosis is  present. Aortic valve mean gradient measures 6.0 mmHg. Aortic valve peak gradient measures 11.4 mmHg. Aortic valve area, by VTI measures 1.41 cm. Pulmonic Valve: The pulmonic valve was normal in structure. Pulmonic valve regurgitation is not visualized. No evidence of pulmonic stenosis. Aorta: The aortic root is normal in size and structure. Venous: The inferior vena cava is normal in size with greater than 50% respiratory variability, suggesting right atrial pressure of 3 mmHg. IAS/Shunts: No atrial level shunt detected by color flow Doppler.  LEFT VENTRICLE PLAX 2D LVIDd:         5.39 cm     Diastology LVIDs:         5.04 cm     LV e' medial:    6.96 cm/s LV PW:         1.15 cm     LV E/e' medial:  16.3 LV IVS:        0.84 cm     LV e' lateral:   8.27 cm/s LVOT diam:     2.20 cm     LV E/e' lateral: 13.7 LV SV:         38  LV SV Index:   17 LVOT Area:     3.80 cm  LV Volumes (MOD) LV vol d, MOD A2C: 88.2 ml LV vol d, MOD A4C: 88.7 ml LV vol s, MOD A2C: 35.7 ml LV vol s, MOD A4C: 25.7 ml LV SV MOD A2C:     52.5 ml LV SV MOD A4C:     88.7 ml LV SV MOD BP:      59.3 ml RIGHT VENTRICLE RV Basal diam:  4.05 cm RV S prime:     8.92 cm/s LEFT ATRIUM             Index        RIGHT ATRIUM           Index LA diam:        4.80 cm 2.12 cm/m   RA Area:     19.20 cm LA Vol (A2C):   68.4 ml 30.17 ml/m  RA Volume:   50.10 ml  22.10 ml/m LA Vol (A4C):   89.1 ml 39.30 ml/m LA Biplane Vol: 84.1 ml 37.09 ml/m  AORTIC VALVE                     PULMONIC VALVE AV Area (Vmax):    1.54 cm      PV Vmax:       0.78 m/s AV Area (Vmean):   1.55 cm      PV Vmean:      52.200 cm/s AV Area (VTI):     1.41 cm      PV VTI:        0.124 m AV Vmax:           169.00 cm/s   PV Peak grad:  2.4 mmHg AV Vmean:          109.000 cm/s  PV Mean grad:  1.0 mmHg AV VTI:            0.273 m AV Peak Grad:      11.4 mmHg AV Mean Grad:      6.0 mmHg LVOT Vmax:         68.40 cm/s LVOT Vmean:        44.500 cm/s LVOT VTI:          0.101 m LVOT/AV VTI ratio:  0.37  AORTA Ao Root diam: 2.90 cm MITRAL VALVE MV Area (PHT): 2.74 cm     SHUNTS MV Area VTI:   1.19 cm     Systemic VTI:  0.10 m MV Peak grad:  6.0 mmHg     Systemic Diam: 2.20 cm MV Mean grad:  2.0 mmHg MV Vmax:       1.22 m/s MV Vmean:      60.6 cm/s MV Decel Time: 277 msec MV E velocity: 113.50 cm/s Julien Nordmannimothy Gollan MD Electronically signed by Julien Nordmannimothy Gollan MD Signature Date/Time: 01/14/2022/4:42:41 PM    Final    US Venous Img Lower Bilateral  Result Date: 01/13/2022 CLINICAL DATA:  Bilateral leg pain and swelling, initial encounter EXAM: BILATERAL LOWER EXTREMITY VENOUS DOPPLER ULTRASOUND TECHNIQUE: Gray-scale sonography with graded compression, as well as color Doppler and duplex ultrasound were performed to evaluate the lower extremity deep venous systems from the level of the common femoral vein and including the common femoral, femoral, profunda femoral, popliteal and calf veins including the posterior tibial, peroneal and gastrocnemius veins when visible. The superficial great saphenous vein was also interrogated. Spectral Doppler was utilized to evaluate flow at rest  and with distal augmentation maneuvers in the common femoral, femoral and popliteal veins. COMPARISON:  None Available. FINDINGS: RIGHT LOWER EXTREMITY Common Femoral Vein: No evidence of thrombus. Normal compressibility, respiratory phasicity and response to augmentation. Saphenofemoral Junction: No evidence of thrombus. Normal compressibility and flow on color Doppler imaging. Profunda Femoral Vein: No evidence of thrombus. Normal compressibility and flow on color Doppler imaging. Femoral Vein: No evidence of thrombus. Normal compressibility, respiratory phasicity and response to augmentation. Popliteal Vein: No evidence of thrombus. Normal compressibility, respiratory phasicity and response to augmentation. Calf Veins: No evidence of thrombus. Normal compressibility and flow on color Doppler imaging. Superficial Great Saphenous Vein: No  evidence of thrombus. Normal compressibility. Venous Reflux:  None. Other Findings:  None. LEFT LOWER EXTREMITY Common Femoral Vein: No evidence of thrombus. Normal compressibility, respiratory phasicity and response to augmentation. Saphenofemoral Junction: No evidence of thrombus. Normal compressibility and flow on color Doppler imaging. Profunda Femoral Vein: No evidence of thrombus. Normal compressibility and flow on color Doppler imaging. Femoral Vein: No evidence of thrombus. Normal compressibility, respiratory phasicity and response to augmentation. Popliteal Vein: No evidence of thrombus. Normal compressibility, respiratory phasicity and response to augmentation. Calf Veins: No evidence of thrombus. Normal compressibility and flow on color Doppler imaging. Superficial Great Saphenous Vein: No evidence of thrombus. Normal compressibility. Venous Reflux:  None. Other Findings:  None. IMPRESSION: No evidence of deep venous thrombosis in either lower extremity. Electronically Signed   By: Alcide Clever M.D.   On: 01/13/2022 20:27   DG Chest Portable 1 View  Result Date: 01/13/2022 CLINICAL DATA:  SOB. Leg swelling and weeping. Reports mechanical fall five days ago. Hx of SOB, asthma, a-fib, CHF, COPD, CAD, HTN, MI, former smoker. EXAM: PORTABLE CHEST 1 VIEW COMPARISON:  Chest x-ray 12/11/2019, CT chest 09/09/2019 FINDINGS: The heart and mediastinal contours are unchanged. Atherosclerotic plaque. Biapical pleural/pulmonary scarring. Hyperinflation. No focal consolidation. Chronic portion show markings with no overt pulmonary edema. No pleural effusion. No pneumothorax. No acute osseous abnormality. IMPRESSION: 1. No active disease. 2. Aortic Atherosclerosis (ICD10-I70.0) and Emphysema (ICD10-J43.9). Electronically Signed   By: Tish Frederickson M.D.   On: 01/13/2022 18:33   (Echo, Carotid, EGD, Colonoscopy, ERCP)    Subjective: No complaints  Discharge Exam: Vitals:   01/25/22 0550 01/25/22 0823  BP:  130/85 127/89  Pulse: (!) 104 (!) 53  Resp: 18 18  Temp: 97.6 F (36.4 C) 97.8 F (36.6 C)  SpO2: 93% 96%   Vitals:   01/24/22 2017 01/25/22 0500 01/25/22 0550 01/25/22 0823  BP: 132/68  130/85 127/89  Pulse: (!) 106  (!) 104 (!) 53  Resp: 20  18 18   Temp: 97.7 F (36.5 C)  97.6 F (36.4 C) 97.8 F (36.6 C)  TempSrc:      SpO2: 93%  93% 96%  Weight:  109.5 kg    Height:        General: Pt is alert, awake, not in acute distress Cardiovascular: RRR, S1/S2 +, no rubs, no gallops Respiratory: CTA bilaterally, no wheezing, no rhonchi Abdominal: Soft, NT, ND, bowel sounds + Extremities: no edema, no cyanosis    The results of significant diagnostics from this hospitalization (including imaging, microbiology, ancillary and laboratory) are listed below for reference.     Microbiology: Recent Results (from the past 240 hour(s))  Aerobic Culture w Gram Stain (superficial specimen)     Status: Abnormal   Collection Time: 01/17/22 10:00 AM   Specimen: Wound  Result Value Ref Range Status  Specimen Description   Final    WOUND Performed at [facility], [address], [place], [place] [address]    Special Requests   Final    LEFT LEG Performed at [facility], [address] [place], [place] [address]    Gram Stain   Final    NO WBC SEEN RARE Romie Minus NEGATIVE RODS RARE GRAM POSITIVE RODS Performed at [facility], [address], [place], [place] [address]    Culture MULTIPLE ORGANISMS PRESENT, NONE PREDOMINANT (A)  Final   Report Status 01/20/2022 FINAL  Final  Culture, blood (Routine X 2) w Reflex to ID Panel     Status: None (Preliminary result)   Collection Time: 01/19/22  7:48 AM   Specimen: BLOOD  Result Value Ref Range Status   Specimen Description BLOOD [facility]  Final   Special Requests   Final    BOTTLES DRAWN AEROBIC AND ANAEROBIC Blood Culture adequate volume   Culture   Final    NO GROWTH 4 DAYS Performed at [facility],  [address] [place], [place] [address]    Report Status PENDING  Incomplete  Culture, blood (Routine X 2) w Reflex to ID Panel     Status: None (Preliminary result)   Collection Time: 01/19/22  7:59 AM   Specimen: BLOOD  Result Value Ref Range Status   Specimen Description BLOOD BLOOD RIGHT HAND  Final   Special Requests   Final    BOTTLES DRAWN AEROBIC AND ANAEROBIC Blood Culture results may not be optimal due to an excessive volume of blood received in culture bottles   Culture   Final    NO GROWTH 4 DAYS Performed at [facility], [address] [place], [place] [address]    Report Status PENDING  Incomplete     Labs: BNP (last 3 results) Recent Labs    01/13/22 1601 01/19/22 0748  BNP 185.2* 122.2*   Basic Metabolic Panel: Recent Labs  Lab 01/19/22 0431 01/20/22 0450 01/21/22 0629 01/22/22 0453 01/23/22 0446 01/24/22 0418  NA 131* 135 133* 134* 135 133*  K 4.0 4.2 4.0 4.3 4.6 4.0  CL 90* 93* 91* 90* 93* 94*  CO2 32 35* 33* 36* 36* 32  GLUCOSE 177* 106* 90 94 103* 105*  BUN 22 18 21  24* 18 15  CREATININE 0.82 0.65 0.67 0.91 0.85 0.76  CALCIUM 9.0 9.0 8.7* 8.9 9.0 8.5*  MG 2.0  --   --   --   --   --    Liver Function Tests: No results for input(s): "AST", "ALT", "ALKPHOS", "BILITOT", "PROT", "ALBUMIN" in the last 168 hours. No results for input(s): "LIPASE", "AMYLASE" in the last 168 hours. No results for input(s): "AMMONIA" in the last 168 hours. CBC: Recent Labs  Lab 01/19/22 0431 01/20/22 0450 01/21/22 0629 01/24/22 0418  WBC 16.4* 11.7* 11.8* 10.9*  HGB 14.2 13.6 13.1 12.1*  HCT 44.1 41.8 40.8 37.9*  MCV 92.6 92.5 91.5 91.5  PLT 433* 398 410* 391   Cardiac Enzymes: No results for input(s): "CKTOTAL", "CKMB", "CKMBINDEX", "TROPONINI" in the last 168 hours. BNP: Invalid input(s): "POCBNP" CBG: No results for input(s): "GLUCAP" in the last 168 hours. D-Dimer No results for input(s): "DDIMER" in the last 72 hours. Hgb A1c No  results for input(s): "HGBA1C" in the last 72 hours. Lipid Profile No results for input(s): "CHOL", "HDL", "LDLCALC", "TRIG", "CHOLHDL", "LDLDIRECT" in the last 72 hours. Thyroid function studies No results for  input(s): "TSH", "T4TOTAL", "T3FREE", "THYROIDAB" in the last 72 hours.  Invalid input(s): "FREET3" Anemia work up No results for input(s): "VITAMINB12", "FOLATE", "FERRITIN", "TIBC", "IRON", "RETICCTPCT" in the last 72 hours. Urinalysis    Component Value Date/Time   COLORURINE YELLOW (A) 08/13/2017 2310   APPEARANCEUR Clear 02/19/2020 1035   LABSPEC 1.014 08/13/2017 2310   LABSPEC 1.013 07/18/2013 1730   PHURINE 5.0 08/13/2017 2310   GLUCOSEU Negative 02/19/2020 1035   GLUCOSEU Negative 07/18/2013 1730   HGBUR SMALL (A) 08/13/2017 2310   BILIRUBINUR Negative 02/19/2020 1035   BILIRUBINUR Negative 07/18/2013 1730   KETONESUR NEGATIVE 08/13/2017 2310   PROTEINUR Negative 02/19/2020 1035   PROTEINUR 30 (A) 08/13/2017 2310   NITRITE Negative 02/19/2020 1035   NITRITE NEGATIVE 08/13/2017 2310   LEUKOCYTESUR Negative 02/19/2020 1035   LEUKOCYTESUR Negative 07/18/2013 1730   Sepsis Labs Recent Labs  Lab 01/19/22 0431 01/20/22 0450 01/21/22 0629 01/24/22 0418  WBC 16.4* 11.7* 11.8* 10.9*   Microbiology Recent Results (from the past 240 hour(s))  Aerobic Culture w Gram Stain (superficial specimen)     Status: Abnormal   Collection Time: 01/17/22 10:00 AM   Specimen: Wound  Result Value Ref Range Status   Specimen Description   Final    WOUND Performed at [facility], [address], [place], [place] [address]    Special Requests   Final    LEFT LEG Performed at [facility], [address] [place], [place] [address]    Gram Stain   Final    NO WBC SEEN RARE Romie Minus NEGATIVE RODS RARE GRAM POSITIVE RODS Performed at [facility], [address] [place], [place] [address]    Culture MULTIPLE ORGANISMS PRESENT, NONE PREDOMINANT (A)   Final   Report Status 01/20/2022 FINAL  Final  Culture, blood (Routine X 2) w Reflex to ID Panel     Status: None (Preliminary result)   Collection Time: 01/19/22  7:48 AM   Specimen: BLOOD  Result Value Ref Range Status   Specimen Description BLOOD [facility]  Final   Special Requests   Final    BOTTLES DRAWN AEROBIC AND ANAEROBIC Blood Culture adequate volume   Culture   Final    NO GROWTH 4 DAYS Performed at [facility], [address] [place], [place] [address]    Report Status PENDING  Incomplete  Culture, blood (Routine X 2) w Reflex to ID Panel     Status: None (Preliminary result)   Collection Time: 01/19/22  7:59 AM   Specimen: BLOOD  Result Value Ref Range Status   Specimen Description BLOOD BLOOD RIGHT HAND  Final   Special Requests   Final    BOTTLES DRAWN AEROBIC AND ANAEROBIC Blood Culture results may not be optimal due to an excessive volume of blood received in culture bottles   Culture   Final    NO GROWTH 4 DAYS Performed at [facility], [address] [place], [place] [address]    Report Status PENDING  Incomplete    SIGNED:   Marinda Elk, MD  [facility] 01/25/2022, 10:41 AM Pager   If 7PM-7AM, please contact night-coverage [email or web address] Password [email or web address]

## 2022-01-27 DIAGNOSIS — I83028 Varicose veins of left lower extremity with ulcer other part of lower leg: Secondary | ICD-10-CM | POA: Diagnosis not present

## 2022-01-27 DIAGNOSIS — B370 Candidal stomatitis: Secondary | ICD-10-CM | POA: Diagnosis not present

## 2022-01-27 DIAGNOSIS — J449 Chronic obstructive pulmonary disease, unspecified: Secondary | ICD-10-CM | POA: Diagnosis not present

## 2022-01-27 DIAGNOSIS — G629 Polyneuropathy, unspecified: Secondary | ICD-10-CM | POA: Diagnosis not present

## 2022-01-27 DIAGNOSIS — I482 Chronic atrial fibrillation, unspecified: Secondary | ICD-10-CM | POA: Diagnosis not present

## 2022-01-27 DIAGNOSIS — I10 Essential (primary) hypertension: Secondary | ICD-10-CM | POA: Diagnosis not present

## 2022-01-27 DIAGNOSIS — J9611 Chronic respiratory failure with hypoxia: Secondary | ICD-10-CM | POA: Diagnosis not present

## 2022-01-27 DIAGNOSIS — L03115 Cellulitis of right lower limb: Secondary | ICD-10-CM | POA: Diagnosis not present

## 2022-01-27 DIAGNOSIS — I5022 Chronic systolic (congestive) heart failure: Secondary | ICD-10-CM | POA: Diagnosis not present

## 2022-01-27 DIAGNOSIS — L03116 Cellulitis of left lower limb: Secondary | ICD-10-CM | POA: Diagnosis not present

## 2022-01-27 DIAGNOSIS — L98499 Non-pressure chronic ulcer of skin of other sites with unspecified severity: Secondary | ICD-10-CM | POA: Diagnosis not present

## 2022-01-27 DIAGNOSIS — R5381 Other malaise: Secondary | ICD-10-CM | POA: Diagnosis not present

## 2022-01-27 DIAGNOSIS — S22040D Wedge compression fracture of fourth thoracic vertebra, subsequent encounter for fracture with routine healing: Secondary | ICD-10-CM | POA: Diagnosis not present

## 2022-01-27 DIAGNOSIS — J45909 Unspecified asthma, uncomplicated: Secondary | ICD-10-CM | POA: Diagnosis not present

## 2022-01-28 DIAGNOSIS — L97509 Non-pressure chronic ulcer of other part of unspecified foot with unspecified severity: Secondary | ICD-10-CM | POA: Diagnosis not present

## 2022-02-01 ENCOUNTER — Telehealth: Payer: Self-pay

## 2022-02-03 DIAGNOSIS — I83028 Varicose veins of left lower extremity with ulcer other part of lower leg: Secondary | ICD-10-CM | POA: Diagnosis not present

## 2022-02-10 DIAGNOSIS — J9611 Chronic respiratory failure with hypoxia: Secondary | ICD-10-CM | POA: Diagnosis not present

## 2022-02-10 DIAGNOSIS — I482 Chronic atrial fibrillation, unspecified: Secondary | ICD-10-CM | POA: Diagnosis not present

## 2022-02-10 DIAGNOSIS — J449 Chronic obstructive pulmonary disease, unspecified: Secondary | ICD-10-CM | POA: Diagnosis not present

## 2022-02-10 DIAGNOSIS — I83028 Varicose veins of left lower extremity with ulcer other part of lower leg: Secondary | ICD-10-CM | POA: Diagnosis not present

## 2022-02-10 DIAGNOSIS — L03115 Cellulitis of right lower limb: Secondary | ICD-10-CM | POA: Diagnosis not present

## 2022-02-10 DIAGNOSIS — S22040D Wedge compression fracture of fourth thoracic vertebra, subsequent encounter for fracture with routine healing: Secondary | ICD-10-CM | POA: Diagnosis not present

## 2022-02-10 DIAGNOSIS — G629 Polyneuropathy, unspecified: Secondary | ICD-10-CM | POA: Diagnosis not present

## 2022-02-10 DIAGNOSIS — R5381 Other malaise: Secondary | ICD-10-CM | POA: Diagnosis not present

## 2022-02-10 DIAGNOSIS — L98499 Non-pressure chronic ulcer of skin of other sites with unspecified severity: Secondary | ICD-10-CM | POA: Diagnosis not present

## 2022-02-10 DIAGNOSIS — I70213 Atherosclerosis of native arteries of extremities with intermittent claudication, bilateral legs: Secondary | ICD-10-CM | POA: Diagnosis not present

## 2022-02-10 DIAGNOSIS — L03116 Cellulitis of left lower limb: Secondary | ICD-10-CM | POA: Diagnosis not present

## 2022-02-10 DIAGNOSIS — K219 Gastro-esophageal reflux disease without esophagitis: Secondary | ICD-10-CM | POA: Diagnosis not present

## 2022-02-10 DIAGNOSIS — I5022 Chronic systolic (congestive) heart failure: Secondary | ICD-10-CM | POA: Diagnosis not present

## 2022-02-15 DIAGNOSIS — J449 Chronic obstructive pulmonary disease, unspecified: Secondary | ICD-10-CM | POA: Diagnosis not present

## 2022-02-15 DIAGNOSIS — J439 Emphysema, unspecified: Secondary | ICD-10-CM | POA: Diagnosis not present

## 2022-02-16 ENCOUNTER — Other Ambulatory Visit: Payer: Self-pay

## 2022-02-16 ENCOUNTER — Telehealth: Payer: Self-pay

## 2022-02-16 MED ORDER — ALBUTEROL SULFATE HFA 108 (90 BASE) MCG/ACT IN AERS: 1.0000 | INHALATION_SPRAY | Freq: Four times a day (QID) | RESPIRATORY_TRACT | 1 refills | Status: DC | PRN

## 2022-02-16 NOTE — Telephone Encounter (Signed)
April from [facility] nurse called  that pt need albuterol inhaler he left in rehab and also no home health  order as per dfk advised pt that we send albuterol inhaler change his appt tomorrow  with Dfk for virtual hospital follow up

## 2022-02-17 ENCOUNTER — Encounter: Payer: Self-pay | Admitting: Internal Medicine

## 2022-02-17 ENCOUNTER — Telehealth (INDEPENDENT_AMBULATORY_CARE_PROVIDER_SITE_OTHER): Payer: Medicare Other | Admitting: Internal Medicine

## 2022-02-17 ENCOUNTER — Telehealth: Payer: Self-pay

## 2022-02-17 DIAGNOSIS — J9611 Chronic respiratory failure with hypoxia: Secondary | ICD-10-CM | POA: Diagnosis not present

## 2022-02-17 DIAGNOSIS — I5022 Chronic systolic (congestive) heart failure: Secondary | ICD-10-CM | POA: Diagnosis not present

## 2022-02-17 DIAGNOSIS — R262 Difficulty in walking, not elsewhere classified: Secondary | ICD-10-CM | POA: Diagnosis not present

## 2022-02-17 DIAGNOSIS — L03116 Cellulitis of left lower limb: Secondary | ICD-10-CM

## 2022-02-17 MED ORDER — ALBUTEROL SULFATE HFA 108 (90 BASE) MCG/ACT IN AERS: 1.0000 | INHALATION_SPRAY | Freq: Four times a day (QID) | RESPIRATORY_TRACT | 1 refills | Status: DC | PRN

## 2022-02-17 NOTE — Progress Notes (Signed)
[facility] [address] [place], [place] [address]  Internal MEDICINE  Office Visit Note  Patient Name: Erik [name]  [id number]  [id number]  Date of Service: 02/17/2022  Tele- health visit    Chief Complaint  Patient presents with   Telephone Assessment    [phone] phone visit   Telephone Screen   Cellulitis    In legs   Hospitalization Follow-up    Admitted 01/12/22, fluid on heart, went to rehab after discharged from hospital until 02/13/22 ([facility])   fractured back    Pt had fell before going to hospital and they checked him while in hospital and found his back was broke in two places     HPI Pt is here for recent hospital follow up. This is a hospital follow-up patient has a past medical history of CAD status post PCI with chronic systolic heart failure by echo on February 2022 ejection fraction 40%, atrial fibrillation on aspirin does not want to be on anticoagulation, patient also has COPD with home oxygen and chronic venous stasis. The patient had worsening shortness of breath and leg swelling torsemide was increased twice a day however patient continued to get worse and went to ED patient also had fall due to reduced mobility. This is a virtual video visit due to mobility dysfunction Patient was admitted with multiple medical diagnoses including bilateral lower extremity cellulitis, acute on chronic systolic heart failure, ulcer of the left lower extremity, ambulatory dysfunction has L4 compression fraction 30% height loss was seen by neurosurgery cardiology and pulmonary. Patient is connected through telephone with complaints of hip pain and back pain and also shortness of breath he is continue to take increased dose of Demadex patient also requesting PT and nursing for strength, ambulation and dressing changes Current Medication: Outpatient Encounter Medications as of 02/17/2022  Medication Sig Note   albuterol (VENTOLIN HFA) 108 (90 Base) MCG/ACT  inhaler Inhale 1 puff into the lungs every 6 (six) hours as needed for wheezing or shortness of breath.    aspirin EC 325 MG tablet Take 325 mg by mouth daily.    gabapentin (NEURONTIN) 100 MG capsule Take 100 mg by mouth 2 (two) times daily. Takes 100 mg twice a day    ipratropium-albuterol (DUONEB) 0.5-2.5 (3) MG/3ML SOLN USE 1 VIAL VIA NEBULIZER EVERY 6 HOURS    metoprolol tartrate (LOPRESSOR) 25 MG tablet TAKE TWO TABLETS BY MOUTH EVERY MORNING and TAKE TWO TABLETS BY MOUTH EVERY EVENING    OXYGEN Inhale 2-3 L into the lungs daily. Pt uses [employer] for oxygen    torsemide (DEMADEX) 10 MG tablet TAKE ONE TABLET BY MOUTH ONCE DAILY    traMADol (ULTRAM) 50 MG tablet Take by mouth. Pt takes 2 tablets every 6 hours from [facility]    TRELEGY ELLIPTA 100-62.5-25 MCG/INH AEPB Inhale 1 puff into the lungs daily. 01/14/2022: LAST FILLED 05/25/21   [DISCONTINUED] collagenase (SANTYL) ointment Apply 1 application with unna wrap changes (Patient not taking: Reported on 12/17/2021)    [DISCONTINUED] losartan (COZAAR) 25 MG tablet Take 1 tablet (25 mg) by mouth once daily (Patient not taking: Reported on 12/17/2021)    [DISCONTINUED] rosuvastatin (CRESTOR) 10 MG tablet  (Patient not taking: Reported on 12/17/2021)    No facility-administered encounter medications on file as of 02/17/2022.    Surgical History: Past Surgical History:  Procedure Laterality Date   ANGIOPLASTY  2009   CARDIAC CATHETERIZATION N/A 03/18/2015   Procedure: Left Heart Cath with Coronary Angiography;  Surgeon: Lamar Blinks, MD;  Location: [facility] INVASIVE CV LAB;  Service: Cardiovascular;  Laterality: N/A;   CHOLECYSTECTOMY  2005   CORONARY ANGIOGRAM  2009    Medical History: Past Medical History:  Diagnosis Date   Arthritis    Asthma    Atrial fibrillation (HCC)    CHF (congestive heart failure) (HCC)    COPD (chronic obstructive pulmonary disease) (HCC)    Coronary artery disease    GERD (gastroesophageal reflux disease)     Hypertension    Myocardial infarction (HCC)    Shortness of breath dyspnea     Family History: Family History  Problem Relation Age of Onset   Coronary artery disease Father    Diabetes Father    Hyperlipidemia Father    Hypertension Father    Stroke Mother    Hyperlipidemia Mother     Social History   Socioeconomic History   Marital status: Married    Spouse name: Not on file   Number of children: Not on file   Years of education: Not on file   Highest education level: Not on file  Occupational History   Not on file  Tobacco Use   Smoking status: Former    Years: 25.00    Types: Cigarettes    Quit date: 03/20/1997    Years since quitting: 24.9   Smokeless tobacco: Never  Vaping Use   Vaping Use: Never used  Substance and Sexual Activity   Alcohol use: No   Drug use: No   Sexual activity: Not on file  Other Topics Concern   Not on file  Social History Narrative   Not on file   Social Determinants of Health   Financial Resource Strain: Low Risk  (11/04/2020)   Overall Financial Resource Strain (CARDIA)    Difficulty of Paying Living Expenses: Not hard at all  Food Insecurity: Not on file  Transportation Needs: Not on file  Physical Activity: Not on file  Stress: Not on file  Social Connections: Not on file  Intimate Partner Violence: Not on file      Review of Systems  Constitutional:  Negative for chills, fatigue, fever and unexpected weight change.  HENT:  Positive for postnasal drip. Negative for congestion, mouth sores, rhinorrhea, sneezing and sore throat.   Eyes:  Negative for redness.  Respiratory:  Positive for shortness of breath. Negative for cough and chest tightness.   Cardiovascular:  Positive for leg swelling. Negative for chest pain and palpitations.  Gastrointestinal:  Negative for abdominal pain, constipation, diarrhea, nausea and vomiting.  Genitourinary:  Negative for dysuria, flank pain and frequency.  Musculoskeletal:  Positive  for back pain and joint swelling. Negative for arthralgias and neck pain.  Skin:  Negative for rash.  Neurological:  Positive for weakness. Negative for tremors and numbness.  Hematological:  Negative for adenopathy. Does not bruise/bleed easily.  Psychiatric/Behavioral: Negative.  Negative for behavioral problems (Depression), sleep disturbance and suicidal ideas. The patient is not nervous/anxious.     Vital Signs: There were no vitals taken for this visit.   Physical Exam Pt was able to speak in full sentences, NAD    Assessment/Plan: 1. Cellulitis of left lower leg Patient did not have Video to address his wound so will request skilled nursing for further evaluation and management  2. Chronic respiratory failure with hypoxia (HCC) Continue on Trelegy as before continue to use oxygen, chest x-ray reviewed was normal  3. Chronic systolic CHF (congestive heart failure) (HCC) Continue  follow-up with cardiology continue increased Demadex will need basic metabolic  4. Ambulatory dysfunction this is multifactorial due to bilateral lower extremity edema back pain hip pain, patient will get benefit from physical therapy  General Counseling: Taylan verbalizes understanding of the findings of todays visit and agrees with plan of treatment. I have discussed any further diagnostic evaluation that may be needed or ordered today. We also reviewed his medications today. he has been encouraged to call the office with any questions or concerns that should arise related to todays visit.    Counseling:  [place] Controlled Substance Database was reviewed by me.  Orders Placed This Encounter  Procedures   Ambulatory referral to Home Health      I have reviewed all medical records from hospital follow up including radiology reports and consults from other physicians. Appropriate follow up diagnostics will be scheduled as needed. Patient/ Family understands the plan of treatment. Time spent35  minutes.   Dr Lyndon Code, MD Internal Medicine

## 2022-02-17 NOTE — Telephone Encounter (Signed)
Send message and call [facility] home health for home health order waiting for response

## 2022-02-18 ENCOUNTER — Telehealth: Payer: Self-pay

## 2022-02-18 ENCOUNTER — Other Ambulatory Visit: Payer: Self-pay | Admitting: Internal Medicine

## 2022-02-18 MED ORDER — TRAMADOL HCL 50 MG PO TABS: ORAL_TABLET | ORAL | 0 refills | Status: DC

## 2022-02-18 NOTE — Telephone Encounter (Signed)
done

## 2022-02-18 NOTE — Telephone Encounter (Signed)
Pt advised wee send pres

## 2022-02-21 ENCOUNTER — Inpatient Hospital Stay: Payer: Medicare Other | Admitting: Physician Assistant

## 2022-02-21 ENCOUNTER — Telehealth: Payer: Self-pay

## 2022-02-21 NOTE — Telephone Encounter (Signed)
Spoke with [facility] that he will take this referral only for nursing for now as per Dfk is ok and home already call pt they will start care for nursing

## 2022-02-22 DIAGNOSIS — Z7982 Long term (current) use of aspirin: Secondary | ICD-10-CM | POA: Diagnosis not present

## 2022-02-22 DIAGNOSIS — W19XXXD Unspecified fall, subsequent encounter: Secondary | ICD-10-CM | POA: Diagnosis not present

## 2022-02-22 DIAGNOSIS — J449 Chronic obstructive pulmonary disease, unspecified: Secondary | ICD-10-CM | POA: Diagnosis not present

## 2022-02-22 DIAGNOSIS — L97822 Non-pressure chronic ulcer of other part of left lower leg with fat layer exposed: Secondary | ICD-10-CM | POA: Diagnosis not present

## 2022-02-22 DIAGNOSIS — L03116 Cellulitis of left lower limb: Secondary | ICD-10-CM | POA: Diagnosis not present

## 2022-02-22 DIAGNOSIS — I5023 Acute on chronic systolic (congestive) heart failure: Secondary | ICD-10-CM | POA: Diagnosis not present

## 2022-02-22 DIAGNOSIS — K219 Gastro-esophageal reflux disease without esophagitis: Secondary | ICD-10-CM | POA: Diagnosis not present

## 2022-02-22 DIAGNOSIS — M549 Dorsalgia, unspecified: Secondary | ICD-10-CM | POA: Diagnosis not present

## 2022-02-22 DIAGNOSIS — I081 Rheumatic disorders of both mitral and tricuspid valves: Secondary | ICD-10-CM | POA: Diagnosis not present

## 2022-02-22 DIAGNOSIS — I110 Hypertensive heart disease with heart failure: Secondary | ICD-10-CM | POA: Diagnosis not present

## 2022-02-22 DIAGNOSIS — I872 Venous insufficiency (chronic) (peripheral): Secondary | ICD-10-CM | POA: Diagnosis not present

## 2022-02-22 DIAGNOSIS — Z955 Presence of coronary angioplasty implant and graft: Secondary | ICD-10-CM | POA: Diagnosis not present

## 2022-02-22 DIAGNOSIS — Z87891 Personal history of nicotine dependence: Secondary | ICD-10-CM | POA: Diagnosis not present

## 2022-02-22 DIAGNOSIS — I482 Chronic atrial fibrillation, unspecified: Secondary | ICD-10-CM | POA: Diagnosis not present

## 2022-02-22 DIAGNOSIS — S32040D Wedge compression fracture of fourth lumbar vertebra, subsequent encounter for fracture with routine healing: Secondary | ICD-10-CM | POA: Diagnosis not present

## 2022-02-22 DIAGNOSIS — Z9981 Dependence on supplemental oxygen: Secondary | ICD-10-CM | POA: Diagnosis not present

## 2022-02-22 DIAGNOSIS — M170 Bilateral primary osteoarthritis of knee: Secondary | ICD-10-CM | POA: Diagnosis not present

## 2022-02-22 DIAGNOSIS — I2510 Atherosclerotic heart disease of native coronary artery without angina pectoris: Secondary | ICD-10-CM | POA: Diagnosis not present

## 2022-02-22 DIAGNOSIS — J9611 Chronic respiratory failure with hypoxia: Secondary | ICD-10-CM | POA: Diagnosis not present

## 2022-02-22 DIAGNOSIS — I10 Essential (primary) hypertension: Secondary | ICD-10-CM | POA: Diagnosis not present

## 2022-02-23 ENCOUNTER — Other Ambulatory Visit: Payer: Self-pay

## 2022-02-23 DIAGNOSIS — S32020D Wedge compression fracture of second lumbar vertebra, subsequent encounter for fracture with routine healing: Secondary | ICD-10-CM

## 2022-02-24 ENCOUNTER — Ambulatory Visit: Payer: Medicare Other | Admitting: Neurosurgery

## 2022-02-24 ENCOUNTER — Telehealth: Payer: Self-pay

## 2022-02-24 NOTE — Telephone Encounter (Signed)
-----   Message from Autumn Messing sent at 02/24/2022 11:39 AM EDT ----- Regarding: rescheduled appt Patient just called to reschedule his appt; he was on the schedule for this afternoon at 2pm. He's experiencing pain in his legs and having difficulty walking. Rescheduled for 8/15 and placed on waiting list for sooner appt when one becomes available.  Thanks!  Victorino Dike

## 2022-02-28 ENCOUNTER — Telehealth: Payer: Self-pay

## 2022-02-28 DIAGNOSIS — L03116 Cellulitis of left lower limb: Secondary | ICD-10-CM | POA: Diagnosis not present

## 2022-02-28 DIAGNOSIS — J449 Chronic obstructive pulmonary disease, unspecified: Secondary | ICD-10-CM | POA: Diagnosis not present

## 2022-02-28 DIAGNOSIS — I5023 Acute on chronic systolic (congestive) heart failure: Secondary | ICD-10-CM | POA: Diagnosis not present

## 2022-02-28 DIAGNOSIS — I482 Chronic atrial fibrillation, unspecified: Secondary | ICD-10-CM | POA: Diagnosis not present

## 2022-02-28 DIAGNOSIS — I110 Hypertensive heart disease with heart failure: Secondary | ICD-10-CM | POA: Diagnosis not present

## 2022-02-28 DIAGNOSIS — I2510 Atherosclerotic heart disease of native coronary artery without angina pectoris: Secondary | ICD-10-CM | POA: Diagnosis not present

## 2022-02-28 DIAGNOSIS — I081 Rheumatic disorders of both mitral and tricuspid valves: Secondary | ICD-10-CM | POA: Diagnosis not present

## 2022-02-28 DIAGNOSIS — J9611 Chronic respiratory failure with hypoxia: Secondary | ICD-10-CM | POA: Diagnosis not present

## 2022-02-28 DIAGNOSIS — Z955 Presence of coronary angioplasty implant and graft: Secondary | ICD-10-CM | POA: Diagnosis not present

## 2022-02-28 DIAGNOSIS — M549 Dorsalgia, unspecified: Secondary | ICD-10-CM | POA: Diagnosis not present

## 2022-02-28 DIAGNOSIS — Z7982 Long term (current) use of aspirin: Secondary | ICD-10-CM | POA: Diagnosis not present

## 2022-02-28 DIAGNOSIS — L97822 Non-pressure chronic ulcer of other part of left lower leg with fat layer exposed: Secondary | ICD-10-CM | POA: Diagnosis not present

## 2022-02-28 DIAGNOSIS — M170 Bilateral primary osteoarthritis of knee: Secondary | ICD-10-CM | POA: Diagnosis not present

## 2022-02-28 DIAGNOSIS — Z9981 Dependence on supplemental oxygen: Secondary | ICD-10-CM | POA: Diagnosis not present

## 2022-02-28 DIAGNOSIS — K219 Gastro-esophageal reflux disease without esophagitis: Secondary | ICD-10-CM | POA: Diagnosis not present

## 2022-02-28 DIAGNOSIS — S32040D Wedge compression fracture of fourth lumbar vertebra, subsequent encounter for fracture with routine healing: Secondary | ICD-10-CM | POA: Diagnosis not present

## 2022-02-28 DIAGNOSIS — I872 Venous insufficiency (chronic) (peripheral): Secondary | ICD-10-CM | POA: Diagnosis not present

## 2022-02-28 DIAGNOSIS — W19XXXD Unspecified fall, subsequent encounter: Secondary | ICD-10-CM | POA: Diagnosis not present

## 2022-02-28 DIAGNOSIS — Z87891 Personal history of nicotine dependence: Secondary | ICD-10-CM | POA: Diagnosis not present

## 2022-02-28 NOTE — Telephone Encounter (Signed)
Received home health certification. Put on Alyssa's desk for signature-Toni

## 2022-03-03 DIAGNOSIS — I482 Chronic atrial fibrillation, unspecified: Secondary | ICD-10-CM | POA: Diagnosis not present

## 2022-03-03 DIAGNOSIS — I081 Rheumatic disorders of both mitral and tricuspid valves: Secondary | ICD-10-CM | POA: Diagnosis not present

## 2022-03-03 DIAGNOSIS — I5023 Acute on chronic systolic (congestive) heart failure: Secondary | ICD-10-CM | POA: Diagnosis not present

## 2022-03-03 DIAGNOSIS — I110 Hypertensive heart disease with heart failure: Secondary | ICD-10-CM | POA: Diagnosis not present

## 2022-03-03 DIAGNOSIS — S32040D Wedge compression fracture of fourth lumbar vertebra, subsequent encounter for fracture with routine healing: Secondary | ICD-10-CM | POA: Diagnosis not present

## 2022-03-03 DIAGNOSIS — I872 Venous insufficiency (chronic) (peripheral): Secondary | ICD-10-CM | POA: Diagnosis not present

## 2022-03-03 DIAGNOSIS — K219 Gastro-esophageal reflux disease without esophagitis: Secondary | ICD-10-CM | POA: Diagnosis not present

## 2022-03-03 DIAGNOSIS — J449 Chronic obstructive pulmonary disease, unspecified: Secondary | ICD-10-CM | POA: Diagnosis not present

## 2022-03-03 DIAGNOSIS — L97822 Non-pressure chronic ulcer of other part of left lower leg with fat layer exposed: Secondary | ICD-10-CM | POA: Diagnosis not present

## 2022-03-03 DIAGNOSIS — I2510 Atherosclerotic heart disease of native coronary artery without angina pectoris: Secondary | ICD-10-CM | POA: Diagnosis not present

## 2022-03-03 DIAGNOSIS — M549 Dorsalgia, unspecified: Secondary | ICD-10-CM | POA: Diagnosis not present

## 2022-03-03 DIAGNOSIS — M170 Bilateral primary osteoarthritis of knee: Secondary | ICD-10-CM | POA: Diagnosis not present

## 2022-03-03 DIAGNOSIS — Z7982 Long term (current) use of aspirin: Secondary | ICD-10-CM | POA: Diagnosis not present

## 2022-03-03 DIAGNOSIS — Z87891 Personal history of nicotine dependence: Secondary | ICD-10-CM | POA: Diagnosis not present

## 2022-03-03 DIAGNOSIS — J9611 Chronic respiratory failure with hypoxia: Secondary | ICD-10-CM | POA: Diagnosis not present

## 2022-03-03 DIAGNOSIS — W19XXXD Unspecified fall, subsequent encounter: Secondary | ICD-10-CM | POA: Diagnosis not present

## 2022-03-03 DIAGNOSIS — L03116 Cellulitis of left lower limb: Secondary | ICD-10-CM | POA: Diagnosis not present

## 2022-03-03 DIAGNOSIS — Z9981 Dependence on supplemental oxygen: Secondary | ICD-10-CM | POA: Diagnosis not present

## 2022-03-03 DIAGNOSIS — Z955 Presence of coronary angioplasty implant and graft: Secondary | ICD-10-CM | POA: Diagnosis not present

## 2022-03-07 DIAGNOSIS — J449 Chronic obstructive pulmonary disease, unspecified: Secondary | ICD-10-CM | POA: Diagnosis not present

## 2022-03-08 DIAGNOSIS — Z9981 Dependence on supplemental oxygen: Secondary | ICD-10-CM | POA: Diagnosis not present

## 2022-03-08 DIAGNOSIS — W19XXXD Unspecified fall, subsequent encounter: Secondary | ICD-10-CM | POA: Diagnosis not present

## 2022-03-08 DIAGNOSIS — L03116 Cellulitis of left lower limb: Secondary | ICD-10-CM | POA: Diagnosis not present

## 2022-03-08 DIAGNOSIS — I081 Rheumatic disorders of both mitral and tricuspid valves: Secondary | ICD-10-CM | POA: Diagnosis not present

## 2022-03-08 DIAGNOSIS — M549 Dorsalgia, unspecified: Secondary | ICD-10-CM | POA: Diagnosis not present

## 2022-03-08 DIAGNOSIS — I5023 Acute on chronic systolic (congestive) heart failure: Secondary | ICD-10-CM | POA: Diagnosis not present

## 2022-03-08 DIAGNOSIS — I110 Hypertensive heart disease with heart failure: Secondary | ICD-10-CM | POA: Diagnosis not present

## 2022-03-08 DIAGNOSIS — L97822 Non-pressure chronic ulcer of other part of left lower leg with fat layer exposed: Secondary | ICD-10-CM | POA: Diagnosis not present

## 2022-03-08 DIAGNOSIS — I872 Venous insufficiency (chronic) (peripheral): Secondary | ICD-10-CM | POA: Diagnosis not present

## 2022-03-08 DIAGNOSIS — K219 Gastro-esophageal reflux disease without esophagitis: Secondary | ICD-10-CM | POA: Diagnosis not present

## 2022-03-08 DIAGNOSIS — J449 Chronic obstructive pulmonary disease, unspecified: Secondary | ICD-10-CM | POA: Diagnosis not present

## 2022-03-08 DIAGNOSIS — Z7982 Long term (current) use of aspirin: Secondary | ICD-10-CM | POA: Diagnosis not present

## 2022-03-08 DIAGNOSIS — J9611 Chronic respiratory failure with hypoxia: Secondary | ICD-10-CM | POA: Diagnosis not present

## 2022-03-08 DIAGNOSIS — S32040D Wedge compression fracture of fourth lumbar vertebra, subsequent encounter for fracture with routine healing: Secondary | ICD-10-CM | POA: Diagnosis not present

## 2022-03-08 DIAGNOSIS — M170 Bilateral primary osteoarthritis of knee: Secondary | ICD-10-CM | POA: Diagnosis not present

## 2022-03-08 DIAGNOSIS — Z955 Presence of coronary angioplasty implant and graft: Secondary | ICD-10-CM | POA: Diagnosis not present

## 2022-03-08 DIAGNOSIS — I482 Chronic atrial fibrillation, unspecified: Secondary | ICD-10-CM | POA: Diagnosis not present

## 2022-03-08 DIAGNOSIS — Z87891 Personal history of nicotine dependence: Secondary | ICD-10-CM | POA: Diagnosis not present

## 2022-03-08 DIAGNOSIS — I2510 Atherosclerotic heart disease of native coronary artery without angina pectoris: Secondary | ICD-10-CM | POA: Diagnosis not present

## 2022-03-10 DIAGNOSIS — I081 Rheumatic disorders of both mitral and tricuspid valves: Secondary | ICD-10-CM | POA: Diagnosis not present

## 2022-03-10 DIAGNOSIS — I110 Hypertensive heart disease with heart failure: Secondary | ICD-10-CM | POA: Diagnosis not present

## 2022-03-10 DIAGNOSIS — Z7982 Long term (current) use of aspirin: Secondary | ICD-10-CM | POA: Diagnosis not present

## 2022-03-10 DIAGNOSIS — I872 Venous insufficiency (chronic) (peripheral): Secondary | ICD-10-CM | POA: Diagnosis not present

## 2022-03-10 DIAGNOSIS — I5023 Acute on chronic systolic (congestive) heart failure: Secondary | ICD-10-CM | POA: Diagnosis not present

## 2022-03-10 DIAGNOSIS — L97822 Non-pressure chronic ulcer of other part of left lower leg with fat layer exposed: Secondary | ICD-10-CM | POA: Diagnosis not present

## 2022-03-10 DIAGNOSIS — M170 Bilateral primary osteoarthritis of knee: Secondary | ICD-10-CM | POA: Diagnosis not present

## 2022-03-10 DIAGNOSIS — Z955 Presence of coronary angioplasty implant and graft: Secondary | ICD-10-CM | POA: Diagnosis not present

## 2022-03-10 DIAGNOSIS — I482 Chronic atrial fibrillation, unspecified: Secondary | ICD-10-CM | POA: Diagnosis not present

## 2022-03-10 DIAGNOSIS — J9611 Chronic respiratory failure with hypoxia: Secondary | ICD-10-CM | POA: Diagnosis not present

## 2022-03-10 DIAGNOSIS — J449 Chronic obstructive pulmonary disease, unspecified: Secondary | ICD-10-CM | POA: Diagnosis not present

## 2022-03-10 DIAGNOSIS — I2510 Atherosclerotic heart disease of native coronary artery without angina pectoris: Secondary | ICD-10-CM | POA: Diagnosis not present

## 2022-03-10 DIAGNOSIS — Z9981 Dependence on supplemental oxygen: Secondary | ICD-10-CM | POA: Diagnosis not present

## 2022-03-10 DIAGNOSIS — S32040D Wedge compression fracture of fourth lumbar vertebra, subsequent encounter for fracture with routine healing: Secondary | ICD-10-CM | POA: Diagnosis not present

## 2022-03-10 DIAGNOSIS — Z87891 Personal history of nicotine dependence: Secondary | ICD-10-CM | POA: Diagnosis not present

## 2022-03-10 DIAGNOSIS — W19XXXD Unspecified fall, subsequent encounter: Secondary | ICD-10-CM | POA: Diagnosis not present

## 2022-03-10 DIAGNOSIS — L03116 Cellulitis of left lower limb: Secondary | ICD-10-CM | POA: Diagnosis not present

## 2022-03-10 DIAGNOSIS — K219 Gastro-esophageal reflux disease without esophagitis: Secondary | ICD-10-CM | POA: Diagnosis not present

## 2022-03-10 DIAGNOSIS — M549 Dorsalgia, unspecified: Secondary | ICD-10-CM | POA: Diagnosis not present

## 2022-03-14 ENCOUNTER — Other Ambulatory Visit: Payer: Self-pay | Admitting: Medical

## 2022-03-14 DIAGNOSIS — I482 Chronic atrial fibrillation, unspecified: Secondary | ICD-10-CM

## 2022-03-17 DIAGNOSIS — I2510 Atherosclerotic heart disease of native coronary artery without angina pectoris: Secondary | ICD-10-CM | POA: Diagnosis not present

## 2022-03-17 DIAGNOSIS — L03116 Cellulitis of left lower limb: Secondary | ICD-10-CM | POA: Diagnosis not present

## 2022-03-17 DIAGNOSIS — M549 Dorsalgia, unspecified: Secondary | ICD-10-CM | POA: Diagnosis not present

## 2022-03-17 DIAGNOSIS — K219 Gastro-esophageal reflux disease without esophagitis: Secondary | ICD-10-CM | POA: Diagnosis not present

## 2022-03-17 DIAGNOSIS — I872 Venous insufficiency (chronic) (peripheral): Secondary | ICD-10-CM | POA: Diagnosis not present

## 2022-03-17 DIAGNOSIS — L97822 Non-pressure chronic ulcer of other part of left lower leg with fat layer exposed: Secondary | ICD-10-CM | POA: Diagnosis not present

## 2022-03-17 DIAGNOSIS — I482 Chronic atrial fibrillation, unspecified: Secondary | ICD-10-CM | POA: Diagnosis not present

## 2022-03-17 DIAGNOSIS — I5023 Acute on chronic systolic (congestive) heart failure: Secondary | ICD-10-CM | POA: Diagnosis not present

## 2022-03-17 DIAGNOSIS — Z955 Presence of coronary angioplasty implant and graft: Secondary | ICD-10-CM | POA: Diagnosis not present

## 2022-03-17 DIAGNOSIS — M170 Bilateral primary osteoarthritis of knee: Secondary | ICD-10-CM | POA: Diagnosis not present

## 2022-03-17 DIAGNOSIS — Z9981 Dependence on supplemental oxygen: Secondary | ICD-10-CM | POA: Diagnosis not present

## 2022-03-17 DIAGNOSIS — Z7982 Long term (current) use of aspirin: Secondary | ICD-10-CM | POA: Diagnosis not present

## 2022-03-17 DIAGNOSIS — J9611 Chronic respiratory failure with hypoxia: Secondary | ICD-10-CM | POA: Diagnosis not present

## 2022-03-17 DIAGNOSIS — I081 Rheumatic disorders of both mitral and tricuspid valves: Secondary | ICD-10-CM | POA: Diagnosis not present

## 2022-03-17 DIAGNOSIS — J449 Chronic obstructive pulmonary disease, unspecified: Secondary | ICD-10-CM | POA: Diagnosis not present

## 2022-03-17 DIAGNOSIS — W19XXXD Unspecified fall, subsequent encounter: Secondary | ICD-10-CM | POA: Diagnosis not present

## 2022-03-17 DIAGNOSIS — S32040D Wedge compression fracture of fourth lumbar vertebra, subsequent encounter for fracture with routine healing: Secondary | ICD-10-CM | POA: Diagnosis not present

## 2022-03-17 DIAGNOSIS — I110 Hypertensive heart disease with heart failure: Secondary | ICD-10-CM | POA: Diagnosis not present

## 2022-03-17 DIAGNOSIS — Z87891 Personal history of nicotine dependence: Secondary | ICD-10-CM | POA: Diagnosis not present

## 2022-03-18 DIAGNOSIS — J439 Emphysema, unspecified: Secondary | ICD-10-CM | POA: Diagnosis not present

## 2022-03-18 DIAGNOSIS — J449 Chronic obstructive pulmonary disease, unspecified: Secondary | ICD-10-CM | POA: Diagnosis not present

## 2022-03-22 ENCOUNTER — Ambulatory Visit: Payer: Medicare Other | Admitting: Neurosurgery

## 2022-03-25 DIAGNOSIS — L97822 Non-pressure chronic ulcer of other part of left lower leg with fat layer exposed: Secondary | ICD-10-CM | POA: Diagnosis not present

## 2022-03-25 DIAGNOSIS — L03116 Cellulitis of left lower limb: Secondary | ICD-10-CM | POA: Diagnosis not present

## 2022-03-25 DIAGNOSIS — W19XXXD Unspecified fall, subsequent encounter: Secondary | ICD-10-CM | POA: Diagnosis not present

## 2022-03-25 DIAGNOSIS — Z9981 Dependence on supplemental oxygen: Secondary | ICD-10-CM | POA: Diagnosis not present

## 2022-03-25 DIAGNOSIS — I872 Venous insufficiency (chronic) (peripheral): Secondary | ICD-10-CM | POA: Diagnosis not present

## 2022-03-25 DIAGNOSIS — M170 Bilateral primary osteoarthritis of knee: Secondary | ICD-10-CM | POA: Diagnosis not present

## 2022-03-25 DIAGNOSIS — K219 Gastro-esophageal reflux disease without esophagitis: Secondary | ICD-10-CM | POA: Diagnosis not present

## 2022-03-25 DIAGNOSIS — J449 Chronic obstructive pulmonary disease, unspecified: Secondary | ICD-10-CM | POA: Diagnosis not present

## 2022-03-25 DIAGNOSIS — S32040D Wedge compression fracture of fourth lumbar vertebra, subsequent encounter for fracture with routine healing: Secondary | ICD-10-CM | POA: Diagnosis not present

## 2022-03-25 DIAGNOSIS — I110 Hypertensive heart disease with heart failure: Secondary | ICD-10-CM | POA: Diagnosis not present

## 2022-03-25 DIAGNOSIS — Z955 Presence of coronary angioplasty implant and graft: Secondary | ICD-10-CM | POA: Diagnosis not present

## 2022-03-25 DIAGNOSIS — M549 Dorsalgia, unspecified: Secondary | ICD-10-CM | POA: Diagnosis not present

## 2022-03-25 DIAGNOSIS — Z7982 Long term (current) use of aspirin: Secondary | ICD-10-CM | POA: Diagnosis not present

## 2022-03-25 DIAGNOSIS — J9611 Chronic respiratory failure with hypoxia: Secondary | ICD-10-CM | POA: Diagnosis not present

## 2022-03-25 DIAGNOSIS — I482 Chronic atrial fibrillation, unspecified: Secondary | ICD-10-CM | POA: Diagnosis not present

## 2022-03-25 DIAGNOSIS — I2510 Atherosclerotic heart disease of native coronary artery without angina pectoris: Secondary | ICD-10-CM | POA: Diagnosis not present

## 2022-03-25 DIAGNOSIS — Z87891 Personal history of nicotine dependence: Secondary | ICD-10-CM | POA: Diagnosis not present

## 2022-03-25 DIAGNOSIS — I081 Rheumatic disorders of both mitral and tricuspid valves: Secondary | ICD-10-CM | POA: Diagnosis not present

## 2022-03-25 DIAGNOSIS — I5023 Acute on chronic systolic (congestive) heart failure: Secondary | ICD-10-CM | POA: Diagnosis not present

## 2022-03-28 ENCOUNTER — Ambulatory Visit: Payer: Medicare Other | Admitting: Internal Medicine

## 2022-03-31 DIAGNOSIS — I5023 Acute on chronic systolic (congestive) heart failure: Secondary | ICD-10-CM | POA: Diagnosis not present

## 2022-03-31 DIAGNOSIS — I872 Venous insufficiency (chronic) (peripheral): Secondary | ICD-10-CM | POA: Diagnosis not present

## 2022-03-31 DIAGNOSIS — Z87891 Personal history of nicotine dependence: Secondary | ICD-10-CM | POA: Diagnosis not present

## 2022-03-31 DIAGNOSIS — Z7982 Long term (current) use of aspirin: Secondary | ICD-10-CM | POA: Diagnosis not present

## 2022-03-31 DIAGNOSIS — K219 Gastro-esophageal reflux disease without esophagitis: Secondary | ICD-10-CM | POA: Diagnosis not present

## 2022-03-31 DIAGNOSIS — I110 Hypertensive heart disease with heart failure: Secondary | ICD-10-CM | POA: Diagnosis not present

## 2022-03-31 DIAGNOSIS — I482 Chronic atrial fibrillation, unspecified: Secondary | ICD-10-CM | POA: Diagnosis not present

## 2022-03-31 DIAGNOSIS — Z955 Presence of coronary angioplasty implant and graft: Secondary | ICD-10-CM | POA: Diagnosis not present

## 2022-03-31 DIAGNOSIS — M170 Bilateral primary osteoarthritis of knee: Secondary | ICD-10-CM | POA: Diagnosis not present

## 2022-03-31 DIAGNOSIS — L97822 Non-pressure chronic ulcer of other part of left lower leg with fat layer exposed: Secondary | ICD-10-CM | POA: Diagnosis not present

## 2022-03-31 DIAGNOSIS — J449 Chronic obstructive pulmonary disease, unspecified: Secondary | ICD-10-CM | POA: Diagnosis not present

## 2022-03-31 DIAGNOSIS — W19XXXD Unspecified fall, subsequent encounter: Secondary | ICD-10-CM | POA: Diagnosis not present

## 2022-03-31 DIAGNOSIS — J9611 Chronic respiratory failure with hypoxia: Secondary | ICD-10-CM | POA: Diagnosis not present

## 2022-03-31 DIAGNOSIS — L03116 Cellulitis of left lower limb: Secondary | ICD-10-CM | POA: Diagnosis not present

## 2022-03-31 DIAGNOSIS — M549 Dorsalgia, unspecified: Secondary | ICD-10-CM | POA: Diagnosis not present

## 2022-03-31 DIAGNOSIS — Z9981 Dependence on supplemental oxygen: Secondary | ICD-10-CM | POA: Diagnosis not present

## 2022-03-31 DIAGNOSIS — S32040D Wedge compression fracture of fourth lumbar vertebra, subsequent encounter for fracture with routine healing: Secondary | ICD-10-CM | POA: Diagnosis not present

## 2022-03-31 DIAGNOSIS — I081 Rheumatic disorders of both mitral and tricuspid valves: Secondary | ICD-10-CM | POA: Diagnosis not present

## 2022-03-31 DIAGNOSIS — I2510 Atherosclerotic heart disease of native coronary artery without angina pectoris: Secondary | ICD-10-CM | POA: Diagnosis not present

## 2022-04-07 DIAGNOSIS — J449 Chronic obstructive pulmonary disease, unspecified: Secondary | ICD-10-CM | POA: Diagnosis not present

## 2022-04-08 NOTE — Progress Notes (Deleted)
Referring Physician:  Sallyanne Kuster, NP [address] [place],  [place] [address]  Primary Physician:  Sallyanne Kuster, NP  History of Present Illness: 04/08/2022  History of CAD, afib, COPD on home O2.   Mr. Erik [name] was seen as hospital consult on 01/24/22 by for acute/subacuteL2 compression fracture and chornic L4 compression fracture.   He was placed in [facility] and is here for follow up.        Duration: *** Location: *** Quality: *** Severity: ***  Precipitating: aggravated by *** Modifying factors: made better by *** Weakness: none Timing: *** Bowel/Bladder Dysfunction: none  Conservative measures:  Physical therapy: ***  Multimodal medical therapy including regular antiinflammatories: ***  Injections: *** epidural steroid injections  Past Surgery: ***  Steffen L Gowell has ***no symptoms of cervical myelopathy.  The symptoms are causing a significant impact on the patient's life.   Review of Systems:  A 10 point review of systems is negative, except for the pertinent positives and negatives detailed in the HPI.  Past Medical History: Past Medical History:  Diagnosis Date   Arthritis    Asthma    Atrial fibrillation (HCC)    CHF (congestive heart failure) (HCC)    COPD (chronic obstructive pulmonary disease) (HCC)    Coronary artery disease    GERD (gastroesophageal reflux disease)    Hypertension    Myocardial infarction (HCC)    Shortness of breath dyspnea     Past Surgical History: Past Surgical History:  Procedure Laterality Date   ANGIOPLASTY  2009   CARDIAC CATHETERIZATION N/A 03/18/2015   Procedure: Left Heart Cath with Coronary Angiography;  Surgeon: Lamar Blinks, MD;  Location: [facility] INVASIVE CV LAB;  Service: Cardiovascular;  Laterality: N/A;   CHOLECYSTECTOMY  2005   CORONARY ANGIOGRAM  2009    Allergies: Allergies as of 04/12/2022 - Review Complete 02/17/2022  Allergen Reaction Noted   Benadryl [diphenhydramine hcl]  Shortness Of Breath 03/18/2015   Morphine and related Other (See Comments) 08/13/2017    Medications: Outpatient Encounter Medications as of 04/12/2022  Medication Sig   albuterol (VENTOLIN HFA) 108 (90 Base) MCG/ACT inhaler Inhale 1 puff into the lungs every 6 (six) hours as needed for wheezing or shortness of breath.   aspirin EC 325 MG tablet Take 325 mg by mouth daily.   gabapentin (NEURONTIN) 100 MG capsule Take 100 mg by mouth 2 (two) times daily. Takes 100 mg twice a day   ipratropium-albuterol (DUONEB) 0.5-2.5 (3) MG/3ML SOLN USE 1 VIAL VIA NEBULIZER EVERY 6 HOURS   metoprolol tartrate (LOPRESSOR) 25 MG tablet TAKE TWO TABLETS BY MOUTH EVERY MORNING AND TAKE TWO TABLETS BY MOUTH EVERY EVENING   OXYGEN Inhale 2-3 L into the lungs daily. Pt uses [facility] for oxygen   torsemide (DEMADEX) 10 MG tablet TAKE ONE TABLET BY MOUTH ONCE DAILY   traMADol (ULTRAM) 50 MG tablet Take one tab po bid for pain   TRELEGY ELLIPTA 100-62.5-25 MCG/INH AEPB Inhale 1 puff into the lungs daily.   No facility-administered encounter medications on file as of 04/12/2022.    Social History: Social History   Tobacco Use   Smoking status: Former    Years: 25.00    Types: Cigarettes    Quit date: 03/20/1997    Years since quitting: 25.0   Smokeless tobacco: Never  Vaping Use   Vaping Use: Never used  Substance Use Topics   Alcohol use: No   Drug use: No    Family Medical History:  Family History  Problem Relation Age of Onset   Coronary artery disease Father    Diabetes Father    Hyperlipidemia Father    Hypertension Father    Stroke Mother    Hyperlipidemia Mother     Physical Examination: There were no vitals filed for this visit.  General: Patient is well developed, well nourished, calm, collected, and in no apparent distress. Attention to examination is appropriate.  Respiratory: Patient is breathing without any difficulty.   NEUROLOGICAL:     Awake, alert, oriented to person, place,  and time.  Speech is clear and fluent. Fund of knowledge is appropriate.   Cranial Nerves: Pupils equal round and reactive to light.  Facial tone is symmetric.  Facial sensation is symmetric.  ROM of spine:  *** ROM of cervical spine *** pain *** ROM of lumbar spine *** pain  No abnormal lesions on exposed skin.   Strength: Side Biceps Triceps Deltoid Interossei Grip Wrist Ext. Wrist Flex.  R 5 5 5 5 5 5 5   L 5 5 5 5 5 5 5    Side Iliopsoas Quads Hamstring PF DF EHL  R 5 5 5 5 5 5   L 5 5 5 5 5 5    Reflexes are ***2+ and symmetric at the biceps, triceps, brachioradialis, patella and achilles.   Hoffman's is absent.  Clonus is not present.   Bilateral upper and lower extremity sensation is intact to light touch.    No evidence of dysmetria noted.  Gait is normal.   ***No difficulty with tandem gait.    Medical Decision Making  Imaging: Lumbar xrays dated ***:    I have personally reviewed the images and agree with the above interpretation.  Assessment and Plan: Mr. Eidem is a pleasant 66 y.o. male with ***  Above treatment options discussed with patient and following plan made:   - Order for physical therapy for *** spine ***. - Continue on current medications including ***. Reviewed proper dosing along with risks and benefits. Take and NSAIDs with food.    - Conservative therapy has been ineffective at relieving the symptoms. The patient would likely benefit from surgical intervention.  We discussed **** along with the details of the procedure and the associated risks and benefits. Patient agrees to proceed with above surgery. Given the patients comorbidities, *he will need *** as well as a PAT appt.     I spent a total of *** minutes in face-to-face and non-face-to-face activities related to this patient's care today.  Thank you for involving me in the care of this patient.   PA-C Dept. of Neurosurgery

## 2022-04-12 ENCOUNTER — Ambulatory Visit: Payer: Medicare Other | Admitting: Orthopedic Surgery

## 2022-04-18 DIAGNOSIS — J439 Emphysema, unspecified: Secondary | ICD-10-CM | POA: Diagnosis not present

## 2022-04-18 DIAGNOSIS — J449 Chronic obstructive pulmonary disease, unspecified: Secondary | ICD-10-CM | POA: Diagnosis not present

## 2022-05-02 ENCOUNTER — Ambulatory Visit: Payer: Medicare Other | Admitting: Internal Medicine

## 2022-05-18 DIAGNOSIS — J449 Chronic obstructive pulmonary disease, unspecified: Secondary | ICD-10-CM | POA: Diagnosis not present

## 2022-05-18 DIAGNOSIS — J439 Emphysema, unspecified: Secondary | ICD-10-CM | POA: Diagnosis not present

## 2022-05-23 ENCOUNTER — Encounter: Payer: Self-pay | Admitting: Internal Medicine

## 2022-05-23 ENCOUNTER — Telehealth (INDEPENDENT_AMBULATORY_CARE_PROVIDER_SITE_OTHER): Payer: Medicare Other | Admitting: Internal Medicine

## 2022-05-23 VITALS — Resp 16 | Ht 70.0 in

## 2022-05-23 DIAGNOSIS — J441 Chronic obstructive pulmonary disease with (acute) exacerbation: Secondary | ICD-10-CM | POA: Diagnosis not present

## 2022-05-23 MED ORDER — AZITHROMYCIN 250 MG PO TABS: ORAL_TABLET | ORAL | 0 refills | Status: DC

## 2022-05-23 MED ORDER — PREDNISONE 10 MG PO TABS: ORAL_TABLET | ORAL | 0 refills | Status: DC

## 2022-05-23 NOTE — Progress Notes (Signed)
[facility] [address] [place], [place] [address]  Internal MEDICINE  Telephone Visit  Patient Name: Erik [name]  [id number]  [id number]  Date of Service: 05/25/2022  I connected with the patient at 357pm by telephone and verified the patients identity using two identifiers.   I discussed the limitations, risks, security and privacy concerns of performing an evaluation and management service by telephone and the availability of in person appointments. I also discussed with the patient that there may be a patient responsible charge related to the service.  The patient expressed understanding and agrees to proceed.    Chief Complaint  Patient presents with   Telephone Screen   Telephone Assessment    [phone], prefers telephone call   Cough    Patient gets bronchitis frequently   Sore Throat    Tested negative for covid, patient feels like he has something wrapped around his neck   Generalized Body Aches    HPI  Pt is connected for acute sick visit Coughing with sputum production. Chest feels tight, has COPD  COVID negative    Current Medication: Outpatient Encounter Medications as of 05/23/2022  Medication Sig Note   albuterol (VENTOLIN HFA) 108 (90 Base) MCG/ACT inhaler Inhale 1 puff into the lungs every 6 (six) hours as needed for wheezing or shortness of breath.    aspirin EC 325 MG tablet Take 325 mg by mouth daily.    azithromycin (ZITHROMAX) 250 MG tablet Take one tab a day for 10 days for uri    gabapentin (NEURONTIN) 100 MG capsule Take 100 mg by mouth 2 (two) times daily. Takes 100 mg twice a day    ipratropium-albuterol (DUONEB) 0.5-2.5 (3) MG/3ML SOLN USE 1 VIAL VIA NEBULIZER EVERY 6 HOURS    metoprolol tartrate (LOPRESSOR) 25 MG tablet TAKE TWO TABLETS BY MOUTH EVERY MORNING AND TAKE TWO TABLETS BY MOUTH EVERY EVENING    OXYGEN Inhale 2-3 L into the lungs daily. Pt uses [facility] for oxygen    predniSONE (DELTASONE) 10 MG tablet Take one tab 3  x day for 3 days, then take one tab 2 x a day for 3 days and then take one tab a day for 3 days for copd    torsemide (DEMADEX) 10 MG tablet TAKE ONE TABLET BY MOUTH ONCE DAILY    traMADol (ULTRAM) 50 MG tablet Take one tab po bid for pain    [DISCONTINUED] TRELEGY ELLIPTA 100-62.5-25 MCG/INH AEPB Inhale 1 puff into the lungs daily. 01/14/2022: LAST FILLED 05/25/21   No facility-administered encounter medications on file as of 05/23/2022.    Surgical History: Past Surgical History:  Procedure Laterality Date   ANGIOPLASTY  2009   CARDIAC CATHETERIZATION N/A 03/18/2015   Procedure: Left Heart Cath with Coronary Angiography;  Surgeon: Lamar Blinks, MD;  Location: [facility] INVASIVE CV LAB;  Service: Cardiovascular;  Laterality: N/A;   CHOLECYSTECTOMY  2005   CORONARY ANGIOGRAM  2009    Medical History: Past Medical History:  Diagnosis Date   Arthritis    Asthma    Atrial fibrillation (HCC)    CHF (congestive heart failure) (HCC)    COPD (chronic obstructive pulmonary disease) (HCC)    Coronary artery disease    GERD (gastroesophageal reflux disease)    Hypertension    Myocardial infarction (HCC)    Shortness of breath dyspnea     Family History: Family History  Problem Relation Age of Onset   Coronary artery disease Father    Diabetes  Father    Hyperlipidemia Father    Hypertension Father    Stroke Mother    Hyperlipidemia Mother     Social History   Socioeconomic History   Marital status: Married    Spouse name: Not on file   Number of children: Not on file   Years of education: Not on file   Highest education level: Not on file  Occupational History   Not on file  Tobacco Use   Smoking status: Former    Years: 25.00    Types: Cigarettes    Quit date: 03/20/1997    Years since quitting: 25.1   Smokeless tobacco: Never  Vaping Use   Vaping Use: Never used  Substance and Sexual Activity   Alcohol use: No   Drug use: No   Sexual activity: Not on file  Other  Topics Concern   Not on file  Social History Narrative   Not on file   Social Determinants of Health   Financial Resource Strain: Low Risk  (11/04/2020)   Overall Financial Resource Strain (CARDIA)    Difficulty of Paying Living Expenses: Not hard at all  Food Insecurity: Not on file  Transportation Needs: Not on file  Physical Activity: Not on file  Stress: Not on file  Social Connections: Not on file  Intimate Partner Violence: Not on file      Review of Systems  Constitutional:  Positive for fatigue. Negative for fever.  HENT:  Negative for congestion, mouth sores and postnasal drip.   Respiratory:  Positive for cough and shortness of breath.   Cardiovascular:  Negative for chest pain.  Genitourinary:  Negative for flank pain.  Psychiatric/Behavioral: Negative.      Vital Signs: Resp 16   Ht 5\' 10"  (1.778 m)   BMI 34.64 kg/m    Observation/Objective:  Able to speak with NAD, but was hoarse    Assessment/Plan: 1. Acute exacerbation of chronic obstructive pulmonary disease (COPD) ([place]) Take meds and finish as prescribed, might need CXR id no improvement is seen  - azithromycin (ZITHROMAX) 250 MG tablet; Take one tab a day for 10 days for uri  Dispense: 10 tablet; Refill: 0 - predniSONE (DELTASONE) 10 MG tablet; Take one tab 3 x day for 3 days, then take one tab 2 x a day for 3 days and then take one tab a day for 3 days for copd  Dispense: 18 tablet; Refill: 0   General Counseling: Cloud verbalizes understanding of the findings of today's phone visit and agrees with plan of treatment. I have discussed any further diagnostic evaluation that may be needed or ordered today. We also reviewed his medications today. he has been encouraged to call the office with any questions or concerns that should arise related to todays visit.    No orders of the defined types were placed in this encounter.   Meds ordered this encounter  Medications   azithromycin (ZITHROMAX)  250 MG tablet    Sig: Take one tab a day for 10 days for uri    Dispense:  10 tablet    Refill:  0   predniSONE (DELTASONE) 10 MG tablet    Sig: Take one tab 3 x day for 3 days, then take one tab 2 x a day for 3 days and then take one tab a day for 3 days for copd    Dispense:  18 tablet    Refill:  0    Time spent:15 Minutes  Dr Lavera Guise Internal medicine

## 2022-05-25 ENCOUNTER — Other Ambulatory Visit: Payer: Self-pay | Admitting: Internal Medicine

## 2022-06-03 DIAGNOSIS — J449 Chronic obstructive pulmonary disease, unspecified: Secondary | ICD-10-CM | POA: Diagnosis not present

## 2022-06-18 DIAGNOSIS — J439 Emphysema, unspecified: Secondary | ICD-10-CM | POA: Diagnosis not present

## 2022-06-18 DIAGNOSIS — J449 Chronic obstructive pulmonary disease, unspecified: Secondary | ICD-10-CM | POA: Diagnosis not present

## 2022-07-06 DIAGNOSIS — J449 Chronic obstructive pulmonary disease, unspecified: Secondary | ICD-10-CM | POA: Diagnosis not present

## 2022-07-12 ENCOUNTER — Telehealth: Payer: Self-pay

## 2022-07-12 NOTE — Telephone Encounter (Signed)
Pt called that is both leg is leaking fluids and hot to touch also he is having shortness of breath as per alyssa abernathy advised him to go ED

## 2022-07-13 ENCOUNTER — Encounter: Payer: Self-pay | Admitting: Emergency Medicine

## 2022-07-13 ENCOUNTER — Emergency Department: Payer: Medicare Other

## 2022-07-13 ENCOUNTER — Inpatient Hospital Stay
Admission: EM | Admit: 2022-07-13 | Discharge: 2022-07-19 | DRG: 603 | Disposition: A | Discharge status: Home Health | Payer: Medicare Other | Attending: Hospitalist | Admitting: Hospitalist

## 2022-07-13 ENCOUNTER — Other Ambulatory Visit: Payer: Self-pay

## 2022-07-13 DIAGNOSIS — I83028 Varicose veins of left lower extremity with ulcer other part of lower leg: Secondary | ICD-10-CM | POA: Diagnosis not present

## 2022-07-13 DIAGNOSIS — M7989 Other specified soft tissue disorders: Secondary | ICD-10-CM | POA: Diagnosis present

## 2022-07-13 DIAGNOSIS — L03115 Cellulitis of right lower limb: Secondary | ICD-10-CM | POA: Diagnosis not present

## 2022-07-13 DIAGNOSIS — Z8249 Family history of ischemic heart disease and other diseases of the circulatory system: Secondary | ICD-10-CM

## 2022-07-13 DIAGNOSIS — R531 Weakness: Secondary | ICD-10-CM | POA: Diagnosis present

## 2022-07-13 DIAGNOSIS — Z83438 Family history of other disorder of lipoprotein metabolism and other lipidemia: Secondary | ICD-10-CM | POA: Diagnosis not present

## 2022-07-13 DIAGNOSIS — I83018 Varicose veins of right lower extremity with ulcer other part of lower leg: Secondary | ICD-10-CM | POA: Diagnosis present

## 2022-07-13 DIAGNOSIS — J439 Emphysema, unspecified: Secondary | ICD-10-CM | POA: Diagnosis not present

## 2022-07-13 DIAGNOSIS — Z79899 Other long term (current) drug therapy: Secondary | ICD-10-CM

## 2022-07-13 DIAGNOSIS — L03116 Cellulitis of left lower limb: Secondary | ICD-10-CM | POA: Diagnosis present

## 2022-07-13 DIAGNOSIS — L03119 Cellulitis of unspecified part of limb: Secondary | ICD-10-CM | POA: Diagnosis present

## 2022-07-13 DIAGNOSIS — Z7982 Long term (current) use of aspirin: Secondary | ICD-10-CM

## 2022-07-13 DIAGNOSIS — Z1152 Encounter for screening for COVID-19: Secondary | ICD-10-CM

## 2022-07-13 DIAGNOSIS — J9611 Chronic respiratory failure with hypoxia: Secondary | ICD-10-CM | POA: Diagnosis not present

## 2022-07-13 DIAGNOSIS — I499 Cardiac arrhythmia, unspecified: Secondary | ICD-10-CM | POA: Diagnosis not present

## 2022-07-13 DIAGNOSIS — A419 Sepsis, unspecified organism: Secondary | ICD-10-CM | POA: Diagnosis not present

## 2022-07-13 DIAGNOSIS — I2510 Atherosclerotic heart disease of native coronary artery without angina pectoris: Secondary | ICD-10-CM | POA: Diagnosis not present

## 2022-07-13 DIAGNOSIS — K219 Gastro-esophageal reflux disease without esophagitis: Secondary | ICD-10-CM | POA: Diagnosis present

## 2022-07-13 DIAGNOSIS — I110 Hypertensive heart disease with heart failure: Secondary | ICD-10-CM | POA: Diagnosis present

## 2022-07-13 DIAGNOSIS — I4821 Permanent atrial fibrillation: Secondary | ICD-10-CM | POA: Diagnosis present

## 2022-07-13 DIAGNOSIS — Z743 Need for continuous supervision: Secondary | ICD-10-CM | POA: Diagnosis not present

## 2022-07-13 DIAGNOSIS — R6889 Other general symptoms and signs: Secondary | ICD-10-CM | POA: Diagnosis not present

## 2022-07-13 DIAGNOSIS — Z9981 Dependence on supplemental oxygen: Secondary | ICD-10-CM

## 2022-07-13 DIAGNOSIS — I4891 Unspecified atrial fibrillation: Secondary | ICD-10-CM

## 2022-07-13 DIAGNOSIS — I252 Old myocardial infarction: Secondary | ICD-10-CM | POA: Diagnosis not present

## 2022-07-13 DIAGNOSIS — Z823 Family history of stroke: Secondary | ICD-10-CM | POA: Diagnosis not present

## 2022-07-13 DIAGNOSIS — R0602 Shortness of breath: Secondary | ICD-10-CM | POA: Diagnosis not present

## 2022-07-13 DIAGNOSIS — Z833 Family history of diabetes mellitus: Secondary | ICD-10-CM | POA: Diagnosis not present

## 2022-07-13 DIAGNOSIS — I5022 Chronic systolic (congestive) heart failure: Secondary | ICD-10-CM | POA: Diagnosis not present

## 2022-07-13 DIAGNOSIS — Z9861 Coronary angioplasty status: Secondary | ICD-10-CM

## 2022-07-13 DIAGNOSIS — I878 Other specified disorders of veins: Secondary | ICD-10-CM | POA: Diagnosis present

## 2022-07-13 DIAGNOSIS — R069 Unspecified abnormalities of breathing: Secondary | ICD-10-CM | POA: Diagnosis not present

## 2022-07-13 DIAGNOSIS — R0600 Dyspnea, unspecified: Secondary | ICD-10-CM | POA: Diagnosis not present

## 2022-07-13 LAB — HEPATIC FUNCTION PANEL
ALT: 30 U/L (ref 0–44)
AST: 31 U/L (ref 15–41)
Albumin: 4 g/dL (ref 3.5–5.0)
Alkaline Phosphatase: 85 U/L (ref 38–126)
Bilirubin, Direct: 0.2 mg/dL (ref 0.0–0.2)
Indirect Bilirubin: 0.8 mg/dL (ref 0.3–0.9)
Total Bilirubin: 1 mg/dL (ref 0.3–1.2)
Total Protein: 8 g/dL (ref 6.5–8.1)

## 2022-07-13 LAB — CBC
HCT: 44.4 % (ref 39.0–52.0)
Hemoglobin: 14 g/dL (ref 13.0–17.0)
MCH: 29.4 pg (ref 26.0–34.0)
MCHC: 31.5 g/dL (ref 30.0–36.0)
MCV: 93.3 fL (ref 80.0–100.0)
Platelets: 382 10*3/uL (ref 150–400)
RBC: 4.76 MIL/uL (ref 4.22–5.81)
RDW: 14.1 % (ref 11.5–15.5)
WBC: 9.5 10*3/uL (ref 4.0–10.5)
nRBC: 0 % (ref 0.0–0.2)

## 2022-07-13 LAB — BASIC METABOLIC PANEL
Anion gap: 10 (ref 5–15)
BUN: 19 mg/dL (ref 8–23)
CO2: 29 mmol/L (ref 22–32)
Calcium: 9 mg/dL (ref 8.9–10.3)
Chloride: 97 mmol/L — ABNORMAL LOW (ref 98–111)
Creatinine, Ser: 0.84 mg/dL (ref 0.61–1.24)
GFR, Estimated: >60 mL/min (ref >60)
Glucose, Bld: 116 mg/dL — ABNORMAL HIGH (ref 70–99)
Potassium: 4.3 mmol/L (ref 3.5–5.1)
Sodium: 136 mmol/L (ref 135–145)

## 2022-07-13 LAB — BRAIN NATRIURETIC PEPTIDE: B Natriuretic Peptide: 287.9 pg/mL — ABNORMAL HIGH (ref 0.0–100.0)

## 2022-07-13 LAB — SEDIMENTATION RATE: Sed Rate: 35 mm/hr — ABNORMAL HIGH (ref 0–20)

## 2022-07-13 LAB — TROPONIN I (HIGH SENSITIVITY): Troponin I (High Sensitivity): 6 ng/L (ref <18)

## 2022-07-13 LAB — C-REACTIVE PROTEIN: CRP: 1.1 mg/dL — ABNORMAL HIGH (ref <1.0)

## 2022-07-13 LAB — RESP PANEL BY RT-PCR (FLU A&B, COVID) ARPGX2
Influenza A by PCR: NEGATIVE
Influenza B by PCR: NEGATIVE
SARS Coronavirus 2 by RT PCR: NEGATIVE

## 2022-07-13 MED ORDER — SODIUM CHLORIDE 0.9 % IV BOLUS
1000.0000 mL | Freq: Once | INTRAVENOUS | Status: AC
  Administered 2022-07-13: 1000 mL via INTRAVENOUS

## 2022-07-13 MED ORDER — DILTIAZEM HCL-DEXTROSE 125-5 MG/125ML-% IV SOLN (PREMIX)
5.0000 mg/h | INTRAVENOUS | Status: DC
  Administered 2022-07-13: 5 mg/h via INTRAVENOUS
  Administered 2022-07-14: 10 mg/h via INTRAVENOUS
  Filled 2022-07-13 (×2): qty 125

## 2022-07-13 MED ORDER — METOPROLOL TARTRATE 50 MG PO TABS
50.0000 mg | ORAL_TABLET | Freq: Two times a day (BID) | ORAL | Status: DC
  Administered 2022-07-13 – 2022-07-19 (×12): 50 mg via ORAL
  Filled 2022-07-13 (×12): qty 1

## 2022-07-13 MED ORDER — HYDROMORPHONE HCL 1 MG/ML IJ SOLN
0.5000 mg | Freq: Once | INTRAMUSCULAR | Status: AC
  Administered 2022-07-13: 0.5 mg via INTRAVENOUS
  Filled 2022-07-13: qty 0.5

## 2022-07-13 MED ORDER — DILTIAZEM LOAD VIA INFUSION
10.0000 mg | Freq: Once | INTRAVENOUS | Status: AC
  Administered 2022-07-13: 10 mg via INTRAVENOUS
  Filled 2022-07-13: qty 10

## 2022-07-13 MED ORDER — POLYETHYLENE GLYCOL 3350 17 G PO PACK: 17.0000 g | PACK | Freq: Two times a day (BID) | ORAL | Status: DC | PRN

## 2022-07-13 MED ORDER — LIDOCAINE 4 % EX CREA
TOPICAL_CREAM | Freq: Two times a day (BID) | CUTANEOUS | Status: DC
  Administered 2022-07-14: 1 via TOPICAL
  Filled 2022-07-13 (×3): qty 5

## 2022-07-13 MED ORDER — ACETAMINOPHEN 500 MG PO TABS
1000.0000 mg | ORAL_TABLET | Freq: Three times a day (TID) | ORAL | Status: DC | PRN
  Administered 2022-07-13 – 2022-07-16 (×5): 1000 mg via ORAL
  Filled 2022-07-13 (×5): qty 2

## 2022-07-13 MED ORDER — SODIUM CHLORIDE 0.9 % IV SOLN
2.0000 g | INTRAVENOUS | Status: DC
  Administered 2022-07-14 – 2022-07-17 (×4): 2 g via INTRAVENOUS
  Filled 2022-07-13: qty 20
  Filled 2022-07-13: qty 2
  Filled 2022-07-13 (×2): qty 20

## 2022-07-13 MED ORDER — VANCOMYCIN HCL IN DEXTROSE 1-5 GM/200ML-% IV SOLN
1000.0000 mg | Freq: Once | INTRAVENOUS | Status: AC
  Administered 2022-07-13: 1000 mg via INTRAVENOUS
  Filled 2022-07-13: qty 200

## 2022-07-13 MED ORDER — DOCUSATE SODIUM 100 MG PO CAPS: 100.0000 mg | ORAL_CAPSULE | Freq: Two times a day (BID) | ORAL | Status: DC | PRN

## 2022-07-13 MED ORDER — ONDANSETRON HCL 4 MG/2ML IJ SOLN: 4.0000 mg | Freq: Four times a day (QID) | INTRAMUSCULAR | Status: DC | PRN

## 2022-07-13 MED ORDER — FLUTICASONE FUROATE-VILANTEROL 100-25 MCG/ACT IN AEPB
1.0000 | INHALATION_SPRAY | Freq: Every day | RESPIRATORY_TRACT | Status: DC
  Administered 2022-07-14 – 2022-07-19 (×6): 1 via RESPIRATORY_TRACT
  Filled 2022-07-13: qty 28

## 2022-07-13 MED ORDER — ONDANSETRON 4 MG PO TBDP: 4.0000 mg | ORAL_TABLET | Freq: Three times a day (TID) | ORAL | Status: DC | PRN

## 2022-07-13 MED ORDER — UMECLIDINIUM BROMIDE 62.5 MCG/ACT IN AEPB
1.0000 | INHALATION_SPRAY | Freq: Every day | RESPIRATORY_TRACT | Status: DC
  Administered 2022-07-14 – 2022-07-19 (×6): 1 via RESPIRATORY_TRACT
  Filled 2022-07-13: qty 7

## 2022-07-13 MED ORDER — IPRATROPIUM-ALBUTEROL 0.5-2.5 (3) MG/3ML IN SOLN
3.0000 mL | RESPIRATORY_TRACT | Status: DC | PRN
  Administered 2022-07-14 – 2022-07-18 (×6): 3 mL via RESPIRATORY_TRACT
  Filled 2022-07-13 (×6): qty 3

## 2022-07-13 MED ORDER — IPRATROPIUM-ALBUTEROL 0.5-2.5 (3) MG/3ML IN SOLN
RESPIRATORY_TRACT | Status: AC
  Administered 2022-07-13: 3 mL via RESPIRATORY_TRACT
  Filled 2022-07-13: qty 3

## 2022-07-13 MED ORDER — FUROSEMIDE 10 MG/ML IJ SOLN
40.0000 mg | Freq: Once | INTRAMUSCULAR | Status: AC
  Administered 2022-07-13: 40 mg via INTRAVENOUS
  Filled 2022-07-13: qty 4

## 2022-07-13 MED ORDER — SODIUM CHLORIDE 0.9 % IV SOLN
2.0000 g | Freq: Once | INTRAVENOUS | Status: AC
  Administered 2022-07-13: 2 g via INTRAVENOUS
  Filled 2022-07-13: qty 20

## 2022-07-13 MED ORDER — ASPIRIN 325 MG PO TBEC
325.0000 mg | DELAYED_RELEASE_TABLET | Freq: Every day | ORAL | Status: DC
  Administered 2022-07-14 – 2022-07-19 (×6): 325 mg via ORAL
  Filled 2022-07-13 (×6): qty 1

## 2022-07-13 MED ORDER — ENOXAPARIN SODIUM 60 MG/0.6ML IJ SOSY
0.5000 mg/kg | PREFILLED_SYRINGE | INTRAMUSCULAR | Status: DC
  Administered 2022-07-13 – 2022-07-18 (×6): 55 mg via SUBCUTANEOUS
  Filled 2022-07-13 (×6): qty 0.6

## 2022-07-13 MED ORDER — METOPROLOL TARTRATE 5 MG/5ML IV SOLN
5.0000 mg | Freq: Once | INTRAVENOUS | Status: AC
  Administered 2022-07-13: 5 mg via INTRAVENOUS
  Filled 2022-07-13: qty 5

## 2022-07-13 NOTE — ED Notes (Signed)
Provided pt with a meal. Elevated pt's heels of the bed for comfort. Call bell remains within reach

## 2022-07-13 NOTE — ED Triage Notes (Signed)
[facility].  Wears home oxygen 3l/ [place].  C/O leg swelling x 3 days.  C/O drainage from both legs.  Bilateral leg swelling, right lower leg red.  Skin tight.  HX:  CHF, COPD, Afib, STent placement.   138/90 98% on 10L -- initially sats 80's on 3l/ [place] 78-129 Pulse CBG:  136

## 2022-07-13 NOTE — H&P (Signed)
History and Physical    Erik [name]:[id number] DOB: 1955/12/10 DOA: 07/13/2022  PCP: Sallyanne Kuster, NP  Patient coming from: home  I have personally briefly reviewed patient's old medical records in [facility]  Chief Complaint: leg swelling  HPI: Erik [name] is a 66 y.o. male with medical history significant of CAD s/p PCI, HFrEF, permanent A-fib on aspirin, COPD on home 2L O2, arthritis and chronic venous stasis who presented with leg swelling and pain.  Pt has chronic leg swelling which had worsened and become painful, with burning type of pain, right worse than left.  Right leg also had open ulcer with drainage.  Pt reported taking torsemide 10 mg daily, but didn't think it helps with the fluid buildup.  Pt used to follow with wound care center but said he couldn't get out of the house anymore due to shortness of breath.  Pt reported respiratory status worsening in the past couple of years, and gets short of breath with just minimal exertion.  Pt used to see pulm Dr. Welton Flakes as outpatient, but also stopped going because of difficulty leaving his house.  Pt does take Trelegy daily and DuoNeb PRN at home.  ED Course: initial vitals: afebrile, pulse 86, BP 161/100, RR 16, sating 100% on 3L.  Labs remarkable.  Trop neg, BNP 287.  CXR showed no acute finding, but Advanced changes of COPD/emphysema.  Pt was started on ceftriaxone and vanc for cellulitis and also 2L NS by ED provider.  Shortly after presentation, pt developed Afib w RVR, and was ordered IV dilt 10 mg f/b dilt gtt.  Assessment/Plan  # LE cellulitis # Chronic venous stasis  --erythema, warmth, edema and burning pain in BLE, right worse than left.  --received ceftriaxone and vanc in the ED Plan: --cont ceftriaxone --IV lasix 40 mg to reduce swelling  # Chronic venous stasis ulcer --consult wound care RN --IV lasix 40 mg to reduce swelling  # Chronic Afib w RVR --developed shortly after presentation.   Received IV dilt 10 mg f/b dilt gtt. --pt takes Lopressor 50 mg BID.  Couldn't tolerate anticoagulants, so takes ASA 325 (though he only takes every other day). Plan: --IV metop 5 mg x1 --cont home lopressor 50 mg BID --cont dilt gtt --cont home ASA 325 daily  # Advanced COPD # Chronic hypoxemic respiratory failure on 2L home O2 --has chronic dyspnea on exertion, but currently does not appear to be in exacerbation.   Plan: --cont home Trelegy  Chronic systolic CHF --last Echo in June 2023 with LVEF 45-50%.  CXR no acute finding.   --cont home lopressor 50 mg BID --cont IV lasix for diuresis for LE edema   DVT prophylaxis: Lovenox SQ Code Status: Full code  Family Communication:   Disposition Plan: to be determined  Consults called: wound care Level of care: Telemetry Cardiac   Review of Systems: As per HPI otherwise complete review of systems negative.   Past Medical History:  Diagnosis Date   Arthritis    Asthma    Atrial fibrillation (HCC)    CHF (congestive heart failure) (HCC)    COPD (chronic obstructive pulmonary disease) (HCC)    Coronary artery disease    GERD (gastroesophageal reflux disease)    Hypertension    Myocardial infarction [facility])    Shortness of breath dyspnea     Past Surgical History:  Procedure Laterality Date   ANGIOPLASTY  2009   CARDIAC CATHETERIZATION N/A 03/18/2015   Procedure: Left  Heart Cath with Coronary Angiography;  Surgeon: Corey Skains, MD;  Location: [facility];  Service: Cardiovascular;  Laterality: N/A;   CHOLECYSTECTOMY  2005   CORONARY ANGIOGRAM  2009     reports that he quit smoking about 25 years ago. He has never used smokeless tobacco. He reports that he does not drink alcohol and does not use drugs.  Allergies  Allergen Reactions   Benadryl [Diphenhydramine Hcl] Shortness Of Breath   Morphine And Related Other (See Comments)    Difficulty breathing per patient     Family History  Problem Relation  Age of Onset   Coronary artery disease Father    Diabetes Father    Hyperlipidemia Father    Hypertension Father    Stroke Mother    Hyperlipidemia Mother     Prior to Admission medications   Medication Sig Start Date End Date Taking? Authorizing Provider  albuterol (VENTOLIN HFA) 108 (90 Base) MCG/ACT inhaler Inhale 1 puff into the lungs every 6 (six) hours as needed for wheezing or shortness of breath. 02/17/22   Lavera Guise, MD  aspirin EC 325 MG tablet Take 325 mg by mouth daily.    [provider]  azithromycin (ZITHROMAX) 250 MG tablet Take one tab a day for 10 days for uri 05/23/22   Lavera Guise, MD  gabapentin (NEURONTIN) 100 MG capsule Take 100 mg by mouth 2 (two) times daily. Takes 100 mg twice a day 01/27/22   [provider]  ipratropium-albuterol (DUONEB) 0.5-2.5 (3) MG/3ML SOLN USE 1 VIAL VIA NEBULIZER EVERY 6 HOURS 12/27/21   Jonetta Osgood, NP  metoprolol tartrate (LOPRESSOR) 25 MG tablet TAKE TWO TABLETS BY MOUTH EVERY MORNING AND TAKE TWO TABLETS BY MOUTH EVERY EVENING 03/15/22   Wellington Hampshire, MD  OXYGEN Inhale 2-3 L into the lungs daily. Pt uses [employer] for oxygen    [provider]  predniSONE (DELTASONE) 10 MG tablet Take one tab 3 x day for 3 days, then take one tab 2 x a day for 3 days and then take one tab a day for 3 days for copd 05/23/22   Lavera Guise, MD  torsemide (DEMADEX) 10 MG tablet TAKE ONE TABLET BY MOUTH ONCE DAILY 03/15/22   Wellington Hampshire, MD  traMADol Veatrice [place]bon) 50 MG tablet Take one tab po bid for pain 02/18/22   Lavera Guise, MD  TRELEGY ELLIPTA 100-62.5-25 MCG/ACT AEPB INHALE 1 PUFF BY MOUTH INTO LUNGS DAILY 05/25/22   Jonetta Osgood, NP    Physical Exam: Vitals:   07/13/22 1423 07/13/22 1425 07/13/22 1505 07/13/22 1631  BP:  (!) 161/100 (!) 164/97 (!) 126/99  Pulse:  86 (!) 124 (!) 105  Resp:  16 (!) 22 (!) 24  Temp:  97.7 F (36.5 C)    TempSrc:  Oral    SpO2:  100% 100% 100%  Weight: 109.5 kg      Height: 5\' 10"  (1.778 m)       Constitutional: NAD, AAOx3 HEENT: conjunctivae and lids normal, EOMI CV: No cyanosis.   RESP: normal respiratory effort, reduced lung sounds, on 2L Extremities: erythema, edema, warmth in BLE, right > Left SKIN: warm, dry Neuro: II - XII grossly intact.   Psych: Normal mood and affect.  Appropriate judgement and reason    Labs on Admission: I have personally reviewed labs and imaging studies  Time spent: 75 minutes  Enzo Bi MD [facility]  If 7PM-7AM, please contact night-coverage  07/13/2022, 5:10 PM

## 2022-07-13 NOTE — Consult Note (Signed)
PHARMACY -  BRIEF ANTIBIOTIC NOTE   Pharmacy has received consult(s) for vancomycin from an ED provider.  The patient's profile has been reviewed for ht/wt/allergies/indication/available labs.    One time order(s) placed for: Vancomycin 1g IV x1 in ED Pt also receivingCeftriaxone 2g IV x1 in ED  Further antibiotics/pharmacy consults should be ordered by admitting physician if indicated.                       Thank you, Martyn Malay 07/13/2022  3:23 PM

## 2022-07-13 NOTE — ED Notes (Signed)
12-lead EKG shows Afib with variable rate 110-150

## 2022-07-13 NOTE — ED Provider Notes (Signed)
[facility] Provider Note    Event Date/Time   First MD Initiated Contact with Patient 07/13/22 1505     (approximate)   History   Shortness of Breath   HPI  Erik [name] is a 66 y.o. male  here with SOB, leg pain and swelling. Pt reports that since he was hospitalized in June he has been progressively declining with poor mobility and weakness. Over the past week or so, he's had worsening bilateral but mostly R leg pain, redness, and swelling and over the past 2-3 days has had significant worsened SOB, palpitations. He has had difficulty even getting around the house due to his SOB. No fevers, chills. No abd pain, n/v/d. No known sick contacts. He states he bumped his R leg on a bed 2 days ago and it has been "weeping" and draining since then.       Physical Exam   Triage Vital Signs: ED Triage Vitals  Enc Vitals Group     BP 07/13/22 1425 (!) 161/100     Pulse Rate 07/13/22 1425 86     Resp 07/13/22 1425 16     Temp 07/13/22 1425 97.7 F (36.5 C)     Temp Source 07/13/22 1425 Oral     SpO2 07/13/22 1425 100 %     Weight 07/13/22 1423 241 lb 6.5 oz (109.5 kg)     Height 07/13/22 1423 _0  (1.778 m)     Head Circumference --      Peak Flow --      Pain Score 07/13/22 1422 8     Pain Loc --      Pain Edu? --      Excl. in [place]? --     Most recent vital signs: Vitals:   07/13/22 1505 07/13/22 1631  BP: (!) 164/97 (!) 126/99  Pulse: (!) 124 (!) 105  Resp: (!) 22 (!) 24  Temp:    SpO2: 100% 100%     General: Awake, no distress.  CV:  Good peripheral perfusion. Tachycardic, irregularly irregular. Resp:  Normal effort. Mild tachypnea noted, normal breath sounds otherwise. Abd:  No distention. No tenderness. Other:  2+ pitting edema bilateral LE, worse on R. There is a superficial area of ulceration and mildly purulent drainage to the R calf, but no deep tunneling or wound is noted. No crepitance.   ED Results / Procedures / Treatments    Labs (all labs ordered are listed, but only abnormal results are displayed) Labs Reviewed  BASIC METABOLIC PANEL - Abnormal; Notable for the following components:      Result Value   Chloride 97 (*)    Glucose, Bld 116 (*)    All other components within normal limits  BRAIN NATRIURETIC PEPTIDE - Abnormal; Notable for the following components:   B Natriuretic Peptide 287.9 (*)    All other components within normal limits  SEDIMENTATION RATE - Abnormal; Notable for the following components:   Sed Rate 35 (*)    All other components within normal limits  RESP PANEL BY RT-PCR (FLU A&B, COVID) ARPGX2  CULTURE, BLOOD (SINGLE)  CBC  HEPATIC FUNCTION PANEL  C-REACTIVE PROTEIN  TROPONIN I (HIGH SENSITIVITY)     EKG Atrial fibrillation with rapid response, VR 113. QRS 80, QTc 438. No acute ST elevations or depressions. No ischemia or infarct.   RADIOLOGY CXR: Clear, no acute findings   I also independently reviewed and agree with radiologist interpretations.   PROCEDURES:  Critical Care performed: Yes, see critical care procedure note(s)  .Critical Care  Performed by: Duffy Bruce, MD Authorized by: Duffy Bruce, MD   Critical care provider statement:    Critical care time (minutes):  35   Critical care time was exclusive of:  Separately billable procedures and treating other patients   Critical care was necessary to treat or prevent imminent or life-threatening deterioration of the following conditions:  Cardiac failure, circulatory failure and respiratory failure   Critical care was time spent personally by me on the following activities:  Development of treatment plan with patient or surrogate, discussions with consultants, evaluation of patient's response to treatment, examination of patient, ordering and review of laboratory studies, ordering and review of radiographic studies, ordering and performing treatments and interventions, pulse oximetry, re-evaluation of  patient's condition and review of old [facility] ED: Medications  diltiazem (CARDIZEM) 1 mg/mL load via infusion 10 mg (10 mg Intravenous Bolus from Bag 07/13/22 1535)    And  diltiazem (CARDIZEM) 125 mg in dextrose 5% 125 mL (1 mg/mL) infusion (5 mg/hr Intravenous New Bag/Given 07/13/22 1535)  aspirin EC tablet 325 mg (has no administration in time range)  metoprolol tartrate (LOPRESSOR) tablet 50 mg (50 mg Oral Given 07/13/22 1732)  fluticasone furoate-vilanterol (BREO ELLIPTA) 100-25 MCG/ACT 1 puff (has no administration in time range)    And  umeclidinium bromide (INCRUSE ELLIPTA) 62.5 MCG/ACT 1 puff (has no administration in time range)  enoxaparin (LOVENOX) injection 55 mg (has no administration in time range)  metoprolol tartrate (LOPRESSOR) injection 5 mg (has no administration in time range)  furosemide (LASIX) injection 40 mg (has no administration in time range)  lidocaine (LMX) 4 % cream (has no administration in time range)  cefTRIAXone (ROCEPHIN) 2 g in sodium chloride 0.9 % 100 mL IVPB (has no administration in time range)  acetaminophen (TYLENOL) tablet 1,000 mg (has no administration in time range)  docusate sodium (COLACE) capsule 100 mg (has no administration in time range)  ondansetron (ZOFRAN) injection 4 mg (has no administration in time range)  ondansetron (ZOFRAN-ODT) disintegrating tablet 4 mg (has no administration in time range)  polyethylene glycol (MIRALAX / GLYCOLAX) packet 17 g (has no administration in time range)  cefTRIAXone (ROCEPHIN) 2 g in sodium chloride 0.9 % 100 mL IVPB (0 g Intravenous Stopped 07/13/22 1617)  vancomycin (VANCOCIN) IVPB 1000 mg/200 mL premix (0 mg Intravenous Stopped 07/13/22 1730)  sodium chloride 0.9 % bolus 1,000 mL (1,000 mLs Intravenous New Bag/Given 07/13/22 1746)  sodium chloride 0.9 % bolus 1,000 mL (0 mLs Intravenous Stopped 07/13/22 1745)  HYDROmorphone (DILAUDID) injection 0.5 mg (0.5 mg Intravenous Given  07/13/22 1614)     IMPRESSION / MDM / [place] / ED COURSE  I reviewed the triage vital signs and the nursing notes.                              Differential diagnosis includes, but is not limited to, cellulitis, CHF, vasculitis, AFib RVR, ACS, anemia, pulmonary edema, pleural effusions, PNA  Patient's presentation is most consistent with acute presentation with potential threat to life or bodily function.  The patient is on the cardiac monitor to evaluate for evidence of arrhythmia and/or significant heart rate changes.  66 yo M with PMHx AFib, HTN, recurrent cellulitis here with b/l leg swelling, R>L, redness, and SOB. Suspect sepsis 2/2 RLE cellulitis, complicated by AFib  RVR. CBC without leukocytosis. CMP with normal renal function, lytes. BNP elevated at 287, will be cautious with fluids. ESR elevated. CXR is clear.   Pt started on empiric IV ABX, IV dilt bolus and drip for his RVR, and will admit to hospitalist. Reviewed prior notes which show admission for similar sx in June.     FINAL CLINICAL IMPRESSION(S) / ED DIAGNOSES   Final diagnoses:  Sepsis due to cellulitis [facility])  Atrial fibrillation with rapid ventricular response ([place])     Rx / DC Orders   ED Discharge Orders     None        Note:  This document was prepared using Dragon voice recognition software and may include unintentional dictation errors.   Duffy Bruce, MD 07/13/22 1807

## 2022-07-14 DIAGNOSIS — Z743 Need for continuous supervision: Secondary | ICD-10-CM | POA: Diagnosis not present

## 2022-07-14 DIAGNOSIS — Z7982 Long term (current) use of aspirin: Secondary | ICD-10-CM | POA: Diagnosis not present

## 2022-07-14 DIAGNOSIS — R531 Weakness: Secondary | ICD-10-CM | POA: Diagnosis present

## 2022-07-14 DIAGNOSIS — Z9981 Dependence on supplemental oxygen: Secondary | ICD-10-CM | POA: Diagnosis not present

## 2022-07-14 DIAGNOSIS — R0902 Hypoxemia: Secondary | ICD-10-CM | POA: Diagnosis not present

## 2022-07-14 DIAGNOSIS — I110 Hypertensive heart disease with heart failure: Secondary | ICD-10-CM | POA: Diagnosis present

## 2022-07-14 DIAGNOSIS — J9611 Chronic respiratory failure with hypoxia: Secondary | ICD-10-CM | POA: Diagnosis present

## 2022-07-14 DIAGNOSIS — I83028 Varicose veins of left lower extremity with ulcer other part of lower leg: Secondary | ICD-10-CM | POA: Diagnosis present

## 2022-07-14 DIAGNOSIS — Z79899 Other long term (current) drug therapy: Secondary | ICD-10-CM | POA: Diagnosis not present

## 2022-07-14 DIAGNOSIS — L03119 Cellulitis of unspecified part of limb: Secondary | ICD-10-CM | POA: Diagnosis present

## 2022-07-14 DIAGNOSIS — J439 Emphysema, unspecified: Secondary | ICD-10-CM | POA: Diagnosis not present

## 2022-07-14 DIAGNOSIS — L03115 Cellulitis of right lower limb: Secondary | ICD-10-CM | POA: Diagnosis present

## 2022-07-14 DIAGNOSIS — M7989 Other specified soft tissue disorders: Secondary | ICD-10-CM | POA: Diagnosis not present

## 2022-07-14 DIAGNOSIS — Z1152 Encounter for screening for COVID-19: Secondary | ICD-10-CM | POA: Diagnosis not present

## 2022-07-14 DIAGNOSIS — L03116 Cellulitis of left lower limb: Secondary | ICD-10-CM | POA: Diagnosis present

## 2022-07-14 DIAGNOSIS — I2510 Atherosclerotic heart disease of native coronary artery without angina pectoris: Secondary | ICD-10-CM | POA: Diagnosis present

## 2022-07-14 DIAGNOSIS — I252 Old myocardial infarction: Secondary | ICD-10-CM | POA: Diagnosis not present

## 2022-07-14 DIAGNOSIS — Z9861 Coronary angioplasty status: Secondary | ICD-10-CM | POA: Diagnosis not present

## 2022-07-14 DIAGNOSIS — Z7401 Bed confinement status: Secondary | ICD-10-CM | POA: Diagnosis not present

## 2022-07-14 DIAGNOSIS — Z83438 Family history of other disorder of lipoprotein metabolism and other lipidemia: Secondary | ICD-10-CM | POA: Diagnosis not present

## 2022-07-14 DIAGNOSIS — I4821 Permanent atrial fibrillation: Secondary | ICD-10-CM | POA: Diagnosis present

## 2022-07-14 DIAGNOSIS — Z823 Family history of stroke: Secondary | ICD-10-CM | POA: Diagnosis not present

## 2022-07-14 DIAGNOSIS — K219 Gastro-esophageal reflux disease without esophagitis: Secondary | ICD-10-CM | POA: Diagnosis present

## 2022-07-14 DIAGNOSIS — I5022 Chronic systolic (congestive) heart failure: Secondary | ICD-10-CM | POA: Diagnosis present

## 2022-07-14 DIAGNOSIS — Z8249 Family history of ischemic heart disease and other diseases of the circulatory system: Secondary | ICD-10-CM | POA: Diagnosis not present

## 2022-07-14 DIAGNOSIS — I83018 Varicose veins of right lower extremity with ulcer other part of lower leg: Secondary | ICD-10-CM | POA: Diagnosis present

## 2022-07-14 DIAGNOSIS — I878 Other specified disorders of veins: Secondary | ICD-10-CM | POA: Diagnosis present

## 2022-07-14 DIAGNOSIS — Z833 Family history of diabetes mellitus: Secondary | ICD-10-CM | POA: Diagnosis not present

## 2022-07-14 DIAGNOSIS — J449 Chronic obstructive pulmonary disease, unspecified: Secondary | ICD-10-CM | POA: Diagnosis not present

## 2022-07-14 MED ORDER — FUROSEMIDE 10 MG/ML IJ SOLN
40.0000 mg | Freq: Two times a day (BID) | INTRAMUSCULAR | Status: AC
  Administered 2022-07-14 (×2): 40 mg via INTRAVENOUS
  Filled 2022-07-14 (×2): qty 4

## 2022-07-14 MED ORDER — TRAMADOL HCL 50 MG PO TABS
50.0000 mg | ORAL_TABLET | Freq: Four times a day (QID) | ORAL | Status: DC | PRN
  Administered 2022-07-14 – 2022-07-19 (×11): 50 mg via ORAL
  Filled 2022-07-14 (×11): qty 1

## 2022-07-14 MED ORDER — TRAMADOL HCL 50 MG PO TABS
50.0000 mg | ORAL_TABLET | Freq: Once | ORAL | Status: AC
  Administered 2022-07-14: 50 mg via ORAL
  Filled 2022-07-14: qty 1

## 2022-07-14 NOTE — TOC Initial Note (Signed)
Transition of Care [facility]) - Initial/Assessment Note    Patient Details  Name: Erik [name] MRN: [id number] Date of Birth: November 11, 1955  Transition of Care [facility]) CM/SW Contact:    Shelbie Hutching, RN Phone Number: 07/14/2022, 9:57 AM  Clinical Narrative:                 Patient placed under observation for sepsis cure to cellulitis in lower legs.  RNCM met with patient at the bedside in the emergency room.  Patient is from home with his wife and her sister.  Patient is on chronic oxygen from [place] at 3L, he has a Chief Technology Officer.  Patient gets around the house with a cane or walker, he has not been out of the house since July, he reports getting significantly out of breath with very little activity.   Patient has been to [facility] in the past for short term rehab, he says he will not go back to SNF.  He will agree to home health at discharge.  He will benefit from nursing for wound care and PT and OT.  Referral given to [facility].  Patient is current with PCP.  Wife provides transportation.  He would like to get a wheelchair, he thinks he could get around better especially out of the home.    TOC will cont to follow.   Expected Discharge Plan: [place] Barriers to Discharge: Continued Medical Work up   Patient Goals and CMS Choice Patient states their goals for this hospitalization and ongoing recovery are:: patient wants to see himself walking again CMS Medicare.gov Compare Post Acute Care list provided to:: Patient Choice offered to / list presented to : Patient  Expected Discharge Plan and Services Expected Discharge Plan: [place]   Discharge Planning Services: CM Consult Post Acute Care Choice: [place] arrangements for the past 2 months: Single Family Home                 DME Arranged: Youth worker wheelchair with seat cushion DME Agency: [employer]       HH Arranged: RN, PT, OT           Prior Living Arrangements/Services Living arrangements for the past 2 months: Single Family Home Lives with:: Spouse Patient language and need for interpreter reviewed:: Yes Do you feel safe going back to the place where you live?: Yes      Need for Family Participation in Patient Care: Yes (Comment) Care giver support system in place?: Yes (comment) Current home services: DME (walker, cane, oxygen) Criminal Activity/Legal Involvement Pertinent to Current Situation/Hospitalization: No - Comment as needed  Activities of Daily Living Home Assistive Devices/Equipment: Oxygen ADL Screening (condition at time of admission) Patient's cognitive ability adequate to safely complete daily activities?: Yes Is the patient deaf or have difficulty hearing?: No Does the patient have difficulty seeing, even when wearing glasses/contacts?: No Does the patient have difficulty concentrating, remembering, or making decisions?: No Patient able to express need for assistance with ADLs?: Yes Does the patient have difficulty dressing or bathing?: No Independently performs ADLs?: Yes (appropriate for developmental age) Does the patient have difficulty walking or climbing stairs?: Yes Weakness of Legs: Both Weakness of Arms/Hands: None  Permission Sought/Granted Permission sought to share information with : Case Manager, Family Supports Permission granted to share information with : Yes, Verbal Permission Granted  Share Information with NAME: [name] Ehrhard  Permission granted to share info w  AGENCY: home health  Permission granted to share info w Relationship: spouse  Permission granted to share info w Contact Information: [phone]  Emotional Assessment Appearance:: Appears stated age Attitude/Demeanor/Rapport: Engaged Affect (typically observed): Accepting Orientation: : Oriented to Self, Oriented to Place, Oriented to  Time, Oriented to Situation Alcohol / Substance Use: Not Applicable Psych  Involvement: No (comment)  Admission diagnosis:  Leg swelling [M79.89] Patient Active Problem List   Diagnosis Date Noted   Leg swelling 07/13/2022   Left hip pain 01/17/2022   Ulcer of extremity due to chronic venous insufficiency ([place]) 01/14/2022   Bilateral cellulitis of lower leg 01/13/2022   Ambulatory dysfunction 01/13/2022   Bilateral lower leg cellulitis 79/15/0569   Chronic systolic CHF (congestive heart failure) ([place]) 01/13/2022   Chronic respiratory failure with hypoxia ([place]) 01/13/2022   Atherosclerosis of native arteries of extremity with intermittent claudication ([place]) 06/01/2020   Dorsalgia 09/11/2017   Occlusion and stenosis of bilateral carotid arteries 09/11/2017   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 [place] [address] Phone: [phone] Fax: [phone]  [place], [address] [place] [address] Phone: [phone] Fax: [phone]     Social Determinants of Health (SDOH) Interventions    Readmission Risk Interventions     No data to display

## 2022-07-14 NOTE — ED Notes (Signed)
Pt request nebulizer for SOB. Given see MAR

## 2022-07-14 NOTE — ED Notes (Signed)
Pt resting comfortably without distress

## 2022-07-14 NOTE — Care Management Obs Status (Signed)
MEDICARE OBSERVATION STATUS NOTIFICATION   Patient Details  Name: Erik [name] MRN: [id number] Date of Birth: [birth year]   Medicare Observation Status Notification Given:  Yes    Allayne Butcher, RN 07/14/2022, 9:40 AM

## 2022-07-14 NOTE — Progress Notes (Signed)
  PROGRESS NOTE    Erik [name]  QIO:[id number] DOB: 1956-02-19 DOA: 07/13/2022 PCP: Sallyanne Kuster, NP  [id number]/[id number]  LOS: 0 days   Brief hospital course:   Assessment & Plan: Erik [name] is a 66 y.o. male with medical history significant of CAD s/p PCI, HFrEF, permanent A-fib on aspirin, COPD on home 2L O2, arthritis and chronic venous stasis who presented with leg swelling and pain.  After presentation, pt developed Afib w RVR.   # LE cellulitis # Chronic venous stasis  --erythema, warmth, edema and burning pain in BLE, right worse than left.  --received ceftriaxone and vanc in the ED Plan: --cont ceftriaxone --IV lasix 40 BID to reduce swelling   # Chronic venous stasis ulcer --consult wound care RN --IV lasix 40 BID to reduce swelling   # Chronic Afib w RVR --developed shortly after presentation.  Received IV dilt 10 mg f/b dilt gtt. --pt takes Lopressor 50 mg BID.  Couldn't tolerate anticoagulants, so takes ASA 325 (though he only takes every other day). --dilt gtt stopped today around noon when HR and BP dropped. Plan: --cont home Lopressor 50 mg BID --cont home ASA 325 daily   # Advanced COPD # Chronic hypoxemic respiratory failure on 2L home O2 --has chronic dyspnea on exertion, but currently does not appear to be in exacerbation.   Plan: --cont home Trelegy   Chronic systolic CHF --last Echo in June 2023 with LVEF 45-50%.  CXR no acute finding.   --Takes torsemide 10 mg daily PTA Plan: --cont home lopressor 50 mg BID --IV lasix 40 BID   DVT prophylaxis: Lovenox SQ Code Status: Full code  Family Communication:  Level of care: Telemetry Cardiac Dispo:   The patient is from: home Anticipated d/c is to: home Anticipated d/c date is: 2-3 days   Subjective and Interval History:  Pt reported good urine output.  Later, HR and BP found to drop, so dilt gtt stopped.   Objective: Vitals:   07/14/22 1615 07/14/22 1630 07/14/22 1645 07/14/22 1704   BP: (!) 143/83 125/70 124/78   Pulse: 67 80 89   Resp: 19 (!) 22 (!) 23   Temp:    98.2 F (36.8 C)  TempSrc:    Oral  SpO2: 98% 97% 92%   Weight:      Height:        Intake/Output Summary (Last 24 hours) at 07/14/2022 1853 Last data filed at 07/14/2022 1814 Gross per 24 hour  Intake 68.25 ml  Output 1720 ml  Net -1651.75 ml   Filed Weights   07/13/22 1423  Weight: 109.5 kg    Examination:   Constitutional: NAD, AAOx3 HEENT: conjunctivae and lids normal, EOMI CV: No cyanosis.   RESP: normal respiratory effort, on 2L Extremities: edema in BLE, improved SKIN: warm, dry Neuro: II - XII grossly intact.   Psych: Normal mood and affect.  Appropriate judgement and reason   Data Reviewed: I have personally reviewed labs and imaging studies  Time spent: 35 minutes  Darlin Priestly, MD [facility] If 7PM-7AM, please contact night-coverage 07/14/2022, 6:53 PM

## 2022-07-14 NOTE — ED Notes (Signed)
Pt continues to complain bilateral leg pain despite following interventions: PRN tylenol given, legs elevated, heels off the bed, and lidocaine cream applied. Floor coverage NP Steward Drone informed via secure chat awaiting response.

## 2022-07-14 NOTE — ED Notes (Signed)
Right lower extremity dressing changed with anecream, non-stick, gauze wrap. Wounds with small amount serous drainage, surrounding skin red.

## 2022-07-14 NOTE — ED Notes (Signed)
Diltiazem drip discontinued by prior nurse on prior shift per report and confirmed after assessing patient. Stop not documented by prior. Charting completed by night RN upon coming on shift.

## 2022-07-14 NOTE — ED Notes (Signed)
Pt bed pad replaced d/t drainage from open ulcer to right lower leg. Pt remains with lower legs elevated and heels raised on pillow. Call bell within reach.

## 2022-07-14 NOTE — ED Notes (Signed)
Pts blood pressures dropped to

## 2022-07-14 NOTE — ED Notes (Signed)
Pt called out as remote had fallen onto the floor. Remote was handed back to the pt. Call bell remains within reach. Pt remains on VS monitor.

## 2022-07-14 NOTE — ED Notes (Signed)
Pt's blood pressure dropped down to 59/52, with a pulse of 56. Turned diltiazem drip off, and pt's blood pressure slowly came back up to 91/58 with a pulse of 54. MD Fran Lowes notified. She advised to discontinue the diltiazem order. Pt is alert and oriented at this time.

## 2022-07-15 DIAGNOSIS — M7989 Other specified soft tissue disorders: Secondary | ICD-10-CM | POA: Diagnosis not present

## 2022-07-15 LAB — BASIC METABOLIC PANEL
Anion gap: 6 (ref 5–15)
BUN: 18 mg/dL (ref 8–23)
CO2: 30 mmol/L (ref 22–32)
Calcium: 8.4 mg/dL — ABNORMAL LOW (ref 8.9–10.3)
Chloride: 99 mmol/L (ref 98–111)
Creatinine, Ser: 0.85 mg/dL (ref 0.61–1.24)
GFR, Estimated: >60 mL/min (ref >60)
Glucose, Bld: 113 mg/dL — ABNORMAL HIGH (ref 70–99)
Potassium: 3.7 mmol/L (ref 3.5–5.1)
Sodium: 135 mmol/L (ref 135–145)

## 2022-07-15 LAB — CBC
HCT: 39.3 % (ref 39.0–52.0)
Hemoglobin: 12.5 g/dL — ABNORMAL LOW (ref 13.0–17.0)
MCH: 29.4 pg (ref 26.0–34.0)
MCHC: 31.8 g/dL (ref 30.0–36.0)
MCV: 92.5 fL (ref 80.0–100.0)
Platelets: 375 10*3/uL (ref 150–400)
RBC: 4.25 MIL/uL (ref 4.22–5.81)
RDW: 14.4 % (ref 11.5–15.5)
WBC: 12.9 10*3/uL — ABNORMAL HIGH (ref 4.0–10.5)
nRBC: 0 % (ref 0.0–0.2)

## 2022-07-15 LAB — MAGNESIUM: Magnesium: 2 mg/dL (ref 1.7–2.4)

## 2022-07-15 MED ORDER — PREDNISONE 20 MG PO TABS
40.0000 mg | ORAL_TABLET | Freq: Every day | ORAL | Status: DC
  Administered 2022-07-15 – 2022-07-19 (×5): 40 mg via ORAL
  Filled 2022-07-15 (×5): qty 2

## 2022-07-15 MED ORDER — FUROSEMIDE 10 MG/ML IJ SOLN
40.0000 mg | Freq: Two times a day (BID) | INTRAMUSCULAR | Status: AC
  Administered 2022-07-15 (×2): 40 mg via INTRAVENOUS
  Filled 2022-07-15 (×2): qty 4

## 2022-07-15 MED ORDER — MEDIHONEY WOUND/BURN DRESSING EX PSTE
1.0000 | PASTE | Freq: Every day | CUTANEOUS | Status: DC
  Administered 2022-07-15 – 2022-07-19 (×5): 1 via TOPICAL
  Filled 2022-07-15 (×2): qty 44

## 2022-07-15 MED ORDER — IPRATROPIUM-ALBUTEROL 0.5-2.5 (3) MG/3ML IN SOLN
3.0000 mL | Freq: Four times a day (QID) | RESPIRATORY_TRACT | Status: DC
  Administered 2022-07-15 – 2022-07-19 (×15): 3 mL via RESPIRATORY_TRACT
  Filled 2022-07-15 (×14): qty 3

## 2022-07-15 MED ORDER — ORAL CARE MOUTH RINSE: 15.0000 mL | OROMUCOSAL | Status: DC | PRN

## 2022-07-15 MED ORDER — HYDROXYZINE HCL 25 MG PO TABS
25.0000 mg | ORAL_TABLET | Freq: Three times a day (TID) | ORAL | Status: DC | PRN
  Administered 2022-07-15 – 2022-07-19 (×6): 25 mg via ORAL
  Filled 2022-07-15 (×6): qty 1

## 2022-07-15 NOTE — Plan of Care (Signed)

## 2022-07-15 NOTE — Progress Notes (Signed)
  PROGRESS NOTE    Erik [name]  ALP:[id number] DOB: 05-Feb-1956 DOA: 07/13/2022 PCP: Erik Kuster, NP  247A/247A-AA  LOS: 1 day   Brief hospital course:   Assessment & Plan: Erik [name] is a 66 y.o. male with medical history significant of CAD s/p PCI, HFrEF, permanent A-fib on aspirin, COPD on home 2L O2, arthritis and chronic venous stasis who presented with leg swelling and pain.  After presentation, pt developed Afib w RVR.   # LE cellulitis # Chronic venous stasis  --erythema, warmth, edema and burning pain in BLE, right worse than left.  --received ceftriaxone and vanc in the ED Plan: --cont ceftriaxone --cont IV lasix 40 BID   # Chronic venous stasis ulcer --consult wound care RN --cont IV lasix 40 BID   # Chronic Afib w RVR --developed shortly after presentation.  Received IV dilt 10 mg f/b dilt gtt. --pt takes Lopressor 50 mg BID.  Couldn't tolerate anticoagulants, so takes ASA 325 (though he only takes every other day). --dilt gtt stopped today around noon when HR and BP dropped. Plan: --cont home Lopressor 50 mg BID --cont home ASA 325 daily   # Severe dyspnea on exertion # Advanced COPD # Chronic hypoxemic respiratory failure on 2L home O2 --has chronic dyspnea on minimal exertion, RN reported O2 down to 84% on 2L just trying to get out of bed.   --pt used to see pulm Dr. Welton Flakes, however, hasn't followed up recently due to difficulty getting out of the house Plan: --cont home Trelegy --start prednisone 40 mg daily --schedule DuoNeb QID   Chronic systolic CHF --last Echo in June 2023 with LVEF 45-50%.  CXR no acute finding.   --Takes torsemide 10 mg daily PTA Plan: --cont home lopressor 50 mg BID --cont IV diuresis   DVT prophylaxis: Lovenox SQ Code Status: Full code  Family Communication:  Level of care: Telemetry Medical Dispo:   The patient is from: home Anticipated d/c is to: home Anticipated d/c date is: 2-3 days   Subjective and  Interval History:  Pt continued to complain of dyspnea on minimal exertion.  Just trying to get out of bed, RN reported O2 down to 84% on 2L.   Objective: Vitals:   07/15/22 0842 07/15/22 1022 07/15/22 1216 07/15/22 1622  BP: 119/78  133/82 109/80  Pulse: (!) 104  99 98  Resp: 20  (!) 24 (!) 24  Temp: 98 F (36.7 C)  97.9 F (36.6 C) (!) 97.5 F (36.4 C)  TempSrc:      SpO2: 90% 96% 95% 95%  Weight:      Height:        Intake/Output Summary (Last 24 hours) at 07/15/2022 1725 Last data filed at 07/15/2022 1351 Gross per 24 hour  Intake 480 ml  Output 2350 ml  Net -1870 ml   Filed Weights   07/13/22 1423  Weight: 109.5 kg    Examination:   Constitutional: NAD, AAOx3 HEENT: conjunctivae and lids normal, EOMI CV: No cyanosis.   RESP: normal respiratory effort, on 2L Extremities: edema and erythema in BLE improved SKIN: warm, dry Neuro: II - XII grossly intact.   Psych: Normal mood and affect.  Appropriate judgement and reason   Data Reviewed: I have personally reviewed labs and imaging studies  Time spent: 50 minutes  Darlin Priestly, MD [facility] If 7PM-7AM, please contact night-coverage 07/15/2022, 5:25 PM

## 2022-07-15 NOTE — Consult Note (Signed)
WOC Nurse Consult Note: Reason for Consult: Consult requested for bilat legs.  Generalized edema and erythremia to bilat calves. Left posterior leg with full thickness stasis ulcer, 2X2X.2cm, red and moist with small amt yellow drainage Right outer and poster calf with scattered areas of full thickness stasis ulcers, separated by narrow bridges of intact skin. Affected area is 80% red, 20% loose slough/eschar, 10X8X.2cm, mod amt yellow drainage. Dressing procedure/placement/frequency: Topical treatment orders provided for bedside nurses to perform as follows to promote removal of nonviable skin: 1. Apply Medihoney to right outer leg wounds Q day, then cover with foam dressing.  (Change foam dressing Q 3 days or PRN soiling.) 2. Foam dressing to left posterior leg, change Q 3 days or PRN soiling. Please re-consult if further assistance is needed.  Thank-you,  Cammie Mcgee MSN, RN, CWOCN, [place], CNS [phone]

## 2022-07-16 DIAGNOSIS — M7989 Other specified soft tissue disorders: Secondary | ICD-10-CM | POA: Diagnosis not present

## 2022-07-16 LAB — CBC
HCT: 39.4 % (ref 39.0–52.0)
Hemoglobin: 12.5 g/dL — ABNORMAL LOW (ref 13.0–17.0)
MCH: 29.7 pg (ref 26.0–34.0)
MCHC: 31.7 g/dL (ref 30.0–36.0)
MCV: 93.6 fL (ref 80.0–100.0)
Platelets: 380 10*3/uL (ref 150–400)
RBC: 4.21 MIL/uL — ABNORMAL LOW (ref 4.22–5.81)
RDW: 14.1 % (ref 11.5–15.5)
WBC: 9.1 10*3/uL (ref 4.0–10.5)
nRBC: 0 % (ref 0.0–0.2)

## 2022-07-16 LAB — BASIC METABOLIC PANEL
Anion gap: 5 (ref 5–15)
BUN: 16 mg/dL (ref 8–23)
CO2: 33 mmol/L — ABNORMAL HIGH (ref 22–32)
Calcium: 8.5 mg/dL — ABNORMAL LOW (ref 8.9–10.3)
Chloride: 97 mmol/L — ABNORMAL LOW (ref 98–111)
Creatinine, Ser: 0.79 mg/dL (ref 0.61–1.24)
GFR, Estimated: >60 mL/min (ref >60)
Glucose, Bld: 147 mg/dL — ABNORMAL HIGH (ref 70–99)
Potassium: 4.1 mmol/L (ref 3.5–5.1)
Sodium: 135 mmol/L (ref 135–145)

## 2022-07-16 LAB — MAGNESIUM: Magnesium: 2 mg/dL (ref 1.7–2.4)

## 2022-07-16 MED ORDER — FUROSEMIDE 10 MG/ML IJ SOLN
40.0000 mg | Freq: Two times a day (BID) | INTRAMUSCULAR | Status: AC
  Administered 2022-07-16 (×2): 40 mg via INTRAVENOUS
  Filled 2022-07-16 (×2): qty 4

## 2022-07-16 MED ORDER — FUROSEMIDE 10 MG/ML IJ SOLN
40.0000 mg | Freq: Two times a day (BID) | INTRAMUSCULAR | Status: DC
  Administered 2022-07-17 – 2022-07-19 (×5): 40 mg via INTRAVENOUS
  Filled 2022-07-16 (×5): qty 4

## 2022-07-16 NOTE — Progress Notes (Signed)
  PROGRESS NOTE    Erik [name]  SEG:[id number] DOB: 10-Jun-1956 DOA: 07/13/2022 PCP: Sallyanne Kuster, NP  247A/247A-AA  LOS: 2 days   Brief hospital course:   Assessment & Plan: Erik [name] is a 66 y.o. male with medical history significant of CAD s/p PCI, HFrEF, permanent A-fib on aspirin, COPD on home 2L O2, arthritis and chronic venous stasis who presented with leg swelling and pain.  After presentation, pt developed Afib w RVR.   # LE cellulitis # Chronic venous stasis  --erythema, warmth, edema and burning pain in BLE, right worse than left.  --received ceftriaxone and vanc in the ED Plan: --cont ceftriaxone --cont IV lasix 40 mg BID   # Chronic venous stasis ulcer --consult wound care RN --wound care per order --cont IV lasix 40 BID   # Chronic Afib w RVR --developed shortly after presentation.  Received IV dilt 10 mg f/b dilt gtt. --pt takes Lopressor 50 mg BID.  Couldn't tolerate anticoagulants, so takes ASA 325 (though he only takes every other day). --dilt gtt stopped today around noon when HR and BP dropped. Plan: --cont home Lopressor 50 mg BID --cont home ASA 325 daily   # Severe dyspnea on exertion # Advanced COPD # Chronic hypoxemic respiratory failure on 2L home O2 --has chronic dyspnea on minimal exertion, RN reported O2 down to 84% on 2L just trying to get out of bed.   --pt used to see pulm Dr. Welton Flakes, however, hasn't followed up recently due to difficulty getting out of the house Plan: --cont home Trelegy --cont prednisone 40 mg daily --schedule DuoNeb QID   Chronic systolic CHF --last Echo in June 2023 with LVEF 45-50%.  CXR no acute finding.   --Takes torsemide 10 mg daily PTA Plan: --cont home lopressor 50 mg BID --cont IV lasix 40 mg BID   DVT prophylaxis: Lovenox SQ Code Status: Full code  Family Communication:  Level of care: Telemetry Medical Dispo:   The patient is from: home Anticipated d/c is to: home Anticipated d/c date  is: 1-2 days   Subjective and Interval History:  Pt reported breathing somewhat improved.   Objective: Vitals:   07/16/22 1158 07/16/22 1214 07/16/22 1633 07/16/22 1704  BP: 110/66   (!) 141/83  Pulse: 81   (!) 108  Resp: 16   20  Temp: 97.9 F (36.6 C)   98.2 F (36.8 C)  TempSrc: Oral   Oral  SpO2: 96% 95% 92% 96%  Weight:      Height:        Intake/Output Summary (Last 24 hours) at 07/16/2022 1845 Last data filed at 07/16/2022 1842 Gross per 24 hour  Intake --  Output 4650 ml  Net -4650 ml   Filed Weights   07/13/22 1423  Weight: 109.5 kg    Examination:   Constitutional: NAD, AAOx3 HEENT: conjunctivae and lids normal, EOMI CV: No cyanosis.   RESP: normal respiratory effort, on 2L Extremities: improved erythema and edema in BLE SKIN: warm, dry Neuro: II - XII grossly intact.   Psych: Normal mood and affect.  Appropriate judgement and reason   Data Reviewed: I have personally reviewed labs and imaging studies  Time spent: 35 minutes  Darlin Priestly, MD [facility] If 7PM-7AM, please contact night-coverage 07/16/2022, 6:45 PM

## 2022-07-16 NOTE — TOC Progression Note (Signed)
Transition of Care [facility]) - Progression Note    Patient Details  Name: Erik [name] MRN: [id number] Date of Birth: 1955-08-17  Transition of Care [facility]) CM/SW Contact  Luvenia Redden, RN Phone Number: [phone] 07/16/2022, 2:02 PM  Clinical Narrative:    Spoke with pt concerning the requested wheelchair. Pt indicated he would go through his PCP for the wheelchair request.  No other needs presented at this time.  TOC will continue to follow up with any additional needs accordingly.  Expected Discharge Plan: Home w Home Health Services Barriers to Discharge: Continued Medical Work up  Expected Discharge Plan and Services Expected Discharge Plan: Home w Home Health Services   Discharge Planning Services: CM Consult Post Acute Care Choice: Home Health Living arrangements for the past 2 months: Single Family Home                 DME Arranged: Community education officer wheelchair with seat cushion DME Agency: [employer]       HH Arranged: RN, PT, OT           Social Determinants of Health (SDOH) Interventions    Readmission Risk Interventions     No data to display

## 2022-07-17 DIAGNOSIS — M7989 Other specified soft tissue disorders: Secondary | ICD-10-CM | POA: Diagnosis not present

## 2022-07-17 LAB — BASIC METABOLIC PANEL
Anion gap: 5 (ref 5–15)
BUN: 16 mg/dL (ref 8–23)
CO2: 34 mmol/L — ABNORMAL HIGH (ref 22–32)
Calcium: 8.7 mg/dL — ABNORMAL LOW (ref 8.9–10.3)
Chloride: 97 mmol/L — ABNORMAL LOW (ref 98–111)
Creatinine, Ser: 0.73 mg/dL (ref 0.61–1.24)
GFR, Estimated: >60 mL/min (ref >60)
Glucose, Bld: 97 mg/dL (ref 70–99)
Potassium: 3.7 mmol/L (ref 3.5–5.1)
Sodium: 136 mmol/L (ref 135–145)

## 2022-07-17 LAB — MAGNESIUM: Magnesium: 2.1 mg/dL (ref 1.7–2.4)

## 2022-07-17 LAB — CBC
HCT: 42.3 % (ref 39.0–52.0)
Hemoglobin: 13.4 g/dL (ref 13.0–17.0)
MCH: 29.5 pg (ref 26.0–34.0)
MCHC: 31.7 g/dL (ref 30.0–36.0)
MCV: 93 fL (ref 80.0–100.0)
Platelets: 438 10*3/uL — ABNORMAL HIGH (ref 150–400)
RBC: 4.55 MIL/uL (ref 4.22–5.81)
RDW: 14.3 % (ref 11.5–15.5)
WBC: 14.5 10*3/uL — ABNORMAL HIGH (ref 4.0–10.5)
nRBC: 0 % (ref 0.0–0.2)

## 2022-07-17 NOTE — Progress Notes (Signed)
Patient asked to use the bathroom.  Patient on 3L [place], 95% at rest.  Patient to edge of bed, +SOB, HR 120's and 02 SAT decresased as low as 84%.  Pt sat on edge of bed until SOB improved.  When pt was ready, ambulated slowly to the BR with walker and standby assist.  HR elevated to 140, RR 30, 02 Sat 85%.  Patient sitting on the toilet, pursed lip breathing.  Allowing pt to use the bathroom and catch his breath. Also rcd a call from CCMD that his heart rate hit 170.   MD made aware.  Per MD, she will consult pulmonary.  Pt to get back to bed

## 2022-07-17 NOTE — Progress Notes (Signed)
PROGRESS NOTE    Erik [name]  LZJ:[id number] DOB: Dec 06, 1955 DOA: 07/13/2022 PCP: Sallyanne Kuster, NP  247A/247A-AA  LOS: 3 days   Brief hospital course:   Assessment & Plan: Erik [name] is a 66 y.o. male with medical history significant of CAD s/p PCI, HFrEF, permanent A-fib on aspirin, COPD on home 2L O2, arthritis and chronic venous stasis who presented with leg swelling and pain.  After presentation, pt developed Afib w RVR.   # LE cellulitis # Chronic venous stasis  --erythema, warmth, edema and burning pain in BLE, right worse than left.  --received ceftriaxone and vanc in the ED Plan: --cont ceftriaxone day 5 of 5 --cont IV lasix 40 mg BID   # Chronic venous stasis ulcer --consult wound care RN --wound care per order --cont IV lasix 40 BID   # Chronic Afib w RVR --developed shortly after presentation.  Received IV dilt 10 mg f/b dilt gtt. --pt takes Lopressor 50 mg BID.  Couldn't tolerate anticoagulants, so takes ASA 325 (though he only takes every other day). --dilt gtt stopped today around noon when HR and BP dropped. Plan: --cont home Lopressor 50 mg BID --cont home ASA 325 daily   # Severe dyspnea on exertion # Advanced COPD # Chronic hypoxemic respiratory failure on 2L home O2 --has chronic dyspnea on minimal exertion, RN reported O2 down to 84% on 2L just trying to get out of bed.   --pt used to see pulm Dr. Welton Flakes, however, hasn't followed up recently due to difficulty getting out of the house Plan: --cont home Trelegy --cont prednisone 40 mg daily --schedule DuoNeb QID --pulm consult today   Chronic systolic CHF --last Echo in June 2023 with LVEF 45-50%.  CXR no acute finding.   --Takes torsemide 10 mg daily PTA Plan: --cont home lopressor 50 mg BID --cont IV lasix 40 BID   DVT prophylaxis: Lovenox SQ Code Status: Full code  Family Communication:  Level of care: Telemetry Medical Dispo:   The patient is from: home Anticipated d/c is  to: home Anticipated d/c date is: 1-2 days   Subjective and Interval History:  Today pt reported breathing not better.  When RN got pt up to walk, HR elevated to 140, RR 30, 02 Sat 85% while on 3L.    Will consult pulm.   Objective: Vitals:   07/17/22 0835 07/17/22 0924 07/17/22 1147 07/17/22 1238  BP: (!) 145/88  125/84   Pulse: 94  98   Resp: 20  20   Temp: 97.7 F (36.5 C)   97.8 F (36.6 C)  TempSrc: Oral   Oral  SpO2: 99% 94% 93%   Weight:      Height:        Intake/Output Summary (Last 24 hours) at 07/17/2022 1455 Last data filed at 07/17/2022 1436 Gross per 24 hour  Intake 240 ml  Output 3650 ml  Net -3410 ml   Filed Weights   07/13/22 1423  Weight: 109.5 kg    Examination:   Constitutional: NAD, AAOx3 HEENT: conjunctivae and lids normal, EOMI CV: No cyanosis.   RESP: normal respiratory effort while at rest, on 2L Extremities: edema and erythema improved in BLE SKIN: warm, dry Neuro: II - XII grossly intact.   Psych: Normal mood and affect.  Appropriate judgement and reason   Data Reviewed: I have personally reviewed labs and imaging studies  Time spent: 35 minutes  Darlin Priestly, MD [facility] If 7PM-7AM, please contact night-coverage 07/17/2022,  2:55 PM

## 2022-07-17 NOTE — Progress Notes (Signed)
Attempted to  get pt OOB and ambulate to monitor his 02 SAT with activity.  Pt declined at this time, states he is too SOB, even with minimal repositioning in the bed.  02 SAT at rest is 93% on 2L [place].  Pt has had about a liter of urine output since IV lasix was given this morning.  Will attempt to get pt OOB later this afternoon.  Covering MD made aware via securechat.  WCTM

## 2022-07-18 LAB — BASIC METABOLIC PANEL
Anion gap: 6 (ref 5–15)
BUN: 17 mg/dL (ref 8–23)
CO2: 36 mmol/L — ABNORMAL HIGH (ref 22–32)
Calcium: 8.5 mg/dL — ABNORMAL LOW (ref 8.9–10.3)
Chloride: 97 mmol/L — ABNORMAL LOW (ref 98–111)
Creatinine, Ser: 0.74 mg/dL (ref 0.61–1.24)
GFR, Estimated: >60 mL/min (ref >60)
Glucose, Bld: 101 mg/dL — ABNORMAL HIGH (ref 70–99)
Potassium: 3.6 mmol/L (ref 3.5–5.1)
Sodium: 139 mmol/L (ref 135–145)

## 2022-07-18 LAB — CBC
HCT: 40.5 % (ref 39.0–52.0)
Hemoglobin: 12.6 g/dL — ABNORMAL LOW (ref 13.0–17.0)
MCH: 29.1 pg (ref 26.0–34.0)
MCHC: 31.1 g/dL (ref 30.0–36.0)
MCV: 93.5 fL (ref 80.0–100.0)
Platelets: 452 10*3/uL — ABNORMAL HIGH (ref 150–400)
RBC: 4.33 MIL/uL (ref 4.22–5.81)
RDW: 14.5 % (ref 11.5–15.5)
WBC: 12.3 10*3/uL — ABNORMAL HIGH (ref 4.0–10.5)
nRBC: 0 % (ref 0.0–0.2)

## 2022-07-18 LAB — CULTURE, BLOOD (SINGLE): Culture: NO GROWTH

## 2022-07-18 LAB — MAGNESIUM: Magnesium: 2 mg/dL (ref 1.7–2.4)

## 2022-07-18 NOTE — Plan of Care (Signed)
  Problem: Clinical Measurements: Goal: Will remain free from infection Outcome: Progressing   Problem: Coping: Goal: Level of anxiety will decrease Outcome: Progressing   Problem: Pain Managment: Goal: General experience of comfort will improve Outcome: Progressing   

## 2022-07-18 NOTE — Care Management Important Message (Signed)
Important Message  Patient Details  Name: Erik [name] MRN: [id number] Date of Birth: Oct 16, 1955   Medicare Important Message Given:  Yes     Johnell Comings 07/18/2022, 11:16 AM

## 2022-07-18 NOTE — Progress Notes (Signed)
  PROGRESS NOTE    Erik [name]  SAY:[id number] DOB: 10/15/1955 DOA: 07/13/2022 PCP: Sallyanne Kuster, NP  247A/247A-AA  LOS: 4 days   Brief hospital course:   Assessment & Plan: [name] is a 66 y.o. male with medical history significant of CAD s/p PCI, HFrEF, permanent A-fib on aspirin, COPD on home 2L O2, arthritis and chronic venous stasis who presented with leg swelling and pain.  After presentation, pt developed Afib w RVR.   # LE cellulitis # Chronic venous stasis  --erythema, warmth, edema and burning pain in BLE, right worse than left.  --received ceftriaxone and vanc in the ED --completed 5 days of ceftriaxone --cont IV lasix 40 mg BID   # Chronic venous stasis ulcer --consult wound care RN --wound care per order --cont IV lasix 40 BID   # Chronic Afib w RVR --developed shortly after presentation.  Received IV dilt 10 mg f/b dilt gtt. --pt takes Lopressor 50 mg BID.  Couldn't tolerate anticoagulants, so takes ASA 325 (though he only takes every other day). --dilt gtt stopped today around noon when HR and BP dropped. Plan: --cont home Lopressor 50 mg BID --cont home ASA 325 daily   # Severe dyspnea on exertion # Advanced COPD # Chronic hypoxemic respiratory failure on 2L home O2 --has chronic dyspnea on minimal exertion, RN reported O2 down to 84% on 2L just trying to get out of bed.   --pt used to see pulm Dr. Welton Flakes, however, hasn't followed up recently due to difficulty getting out of the house Plan: --cont home Trelegy --cont prednisone 40 mg daily --schedule DuoNeb QID --pulm consult today with Dr. Welton Flakes   Chronic systolic CHF --last Echo in June 2023 with LVEF 45-50%.  CXR no acute finding.   --Takes torsemide 10 mg daily PTA Plan: --cont home lopressor 50 mg BID --cont IV lasix 40 BID   DVT prophylaxis: Lovenox SQ Code Status: Full code  Family Communication:  Level of care: Telemetry Medical Dispo:   The patient is from:  home Anticipated d/c is to: home Anticipated d/c date is: 1-2 days   Subjective and Interval History:  Pt complained of his leg hurting.  Still complained of dyspnea.  Pulm consult with Dr. Welton Flakes today.   Objective: Vitals:   07/18/22 0741 07/18/22 0748 07/18/22 1147 07/18/22 1633  BP: (!) 137/98  124/85 113/80  Pulse: 89  90 99  Resp: 20  20 18   Temp: 97.6 F (36.4 C)  97.8 F (36.6 C) 97.6 F (36.4 C)  TempSrc: Oral  Oral Oral  SpO2: 97% 97% 96% 97%  Weight:      Height:        Intake/Output Summary (Last 24 hours) at 07/18/2022 1944 Last data filed at 07/18/2022 1925 Gross per 24 hour  Intake 960 ml  Output 3900 ml  Net -2940 ml   Filed Weights   07/13/22 1423  Weight: 109.5 kg    Examination:   Constitutional: NAD, AAOx3 HEENT: conjunctivae and lids normal, EOMI CV: No cyanosis.   RESP: normal respiratory effort at rest, on 3L Extremities: improved erythema and edema in BLE with superficial ulcers over right lower leg SKIN: warm, dry Neuro: II - XII grossly intact.   Psych: Normal mood and affect.  Appropriate judgement and reason   Data Reviewed: I have personally reviewed labs and imaging studies  Time spent: 35 minutes  14/06/23, MD [facility] If 7PM-7AM, please contact night-coverage 07/18/2022, 7:44 PM

## 2022-07-19 LAB — BASIC METABOLIC PANEL
Anion gap: 8 (ref 5–15)
BUN: 18 mg/dL (ref 8–23)
CO2: 34 mmol/L — ABNORMAL HIGH (ref 22–32)
Calcium: 8.5 mg/dL — ABNORMAL LOW (ref 8.9–10.3)
Chloride: 96 mmol/L — ABNORMAL LOW (ref 98–111)
Creatinine, Ser: 0.63 mg/dL (ref 0.61–1.24)
GFR, Estimated: >60 mL/min (ref >60)
Glucose, Bld: 95 mg/dL (ref 70–99)
Potassium: 4 mmol/L (ref 3.5–5.1)
Sodium: 138 mmol/L (ref 135–145)

## 2022-07-19 LAB — CBC
HCT: 45.1 % (ref 39.0–52.0)
Hemoglobin: 14.1 g/dL (ref 13.0–17.0)
MCH: 29.3 pg (ref 26.0–34.0)
MCHC: 31.3 g/dL (ref 30.0–36.0)
MCV: 93.8 fL (ref 80.0–100.0)
Platelets: 437 10*3/uL — ABNORMAL HIGH (ref 150–400)
RBC: 4.81 MIL/uL (ref 4.22–5.81)
RDW: 14.2 % (ref 11.5–15.5)
WBC: 12.8 10*3/uL — ABNORMAL HIGH (ref 4.0–10.5)
nRBC: 0 % (ref 0.0–0.2)

## 2022-07-19 LAB — MAGNESIUM: Magnesium: 2.1 mg/dL (ref 1.7–2.4)

## 2022-07-19 MED ORDER — TORSEMIDE 20 MG PO TABS: 40.0000 mg | ORAL_TABLET | Freq: Every day | ORAL | 0 refills | Status: DC

## 2022-07-19 MED ORDER — IPRATROPIUM-ALBUTEROL 0.5-2.5 (3) MG/3ML IN SOLN
3.0000 mL | Freq: Three times a day (TID) | RESPIRATORY_TRACT | Status: DC
  Administered 2022-07-19: 3 mL via RESPIRATORY_TRACT
  Filled 2022-07-19: qty 3

## 2022-07-19 MED ORDER — HYDROXYZINE HCL 25 MG PO TABS: 25.0000 mg | ORAL_TABLET | Freq: Three times a day (TID) | ORAL | 1 refills | Status: DC | PRN

## 2022-07-19 MED ORDER — MEDIHONEY WOUND/BURN DRESSING EX PSTE: 1.0000 | PASTE | Freq: Every day | CUTANEOUS | 1 refills | Status: DC

## 2022-07-19 MED ORDER — PREDNISONE 20 MG PO TABS: 40.0000 mg | ORAL_TABLET | Freq: Every day | ORAL | 0 refills | Status: AC

## 2022-07-19 MED ORDER — TRAMADOL HCL 50 MG PO TABS: 50.0000 mg | ORAL_TABLET | Freq: Four times a day (QID) | ORAL | 0 refills | Status: AC | PRN

## 2022-07-19 NOTE — TOC Transition Note (Signed)
Transition of Care [facility]) - CM/SW Discharge Note   Patient Details  Name: Erik [name] MRN: [id number] Date of Birth: [birth year]  [facility] CM/SW Contact:  Truddie Hidden, RN Phone Number: 07/19/2022, 1:57 PM   Clinical Narrative:    EMS arranged for 3pm Spoke with [name] from [place] to notify of discharge.   TOC signing off      Barriers to Discharge: Continued Medical Work up   Patient Goals and CMS Choice Patient states their goals for this hospitalization and ongoing recovery are:: patient wants to see himself walking again CMS Medicare.gov Compare Post Acute Care list provided to:: Patient Choice offered to / list presented to : Patient  Discharge Placement                       Discharge Plan and Services   Discharge Planning Services: CM Consult Post Acute Care Choice: Home Health          DME Arranged: Lightweight manual wheelchair with seat cushion DME Agency: [employer]       HH Arranged: RN, PT, OT          Social Determinants of Health (SDOH) Interventions     Readmission Risk Interventions     No data to display

## 2022-07-19 NOTE — Discharge Summary (Signed)
Physician Discharge Summary   Erik [name]  male DOB: [birth year]  ZOX:[id number]  PCP: Sallyanne Kuster, NP  Admit date: 07/13/2022 Discharge date: 07/19/2022  Admitted From: home Disposition:  home Home Health: Yes CODE STATUS: Full code  Discharge Instructions     Diet - low sodium heart healthy   Complete by: As directed    Discharge instructions   Complete by: As directed    Your leg cellulitis has been treated with IV antibiotic.  Your leg swelling has been treated with IV diuresis.  I have increased your torsemide from 10 mg to 40 mg daily (for 30 days) because your severe leg swelling was causing ulcer and cellulitis.  Home health nurse will draw your labs in 2 weeks to ensure stable kidney function and electrolytes, and follow up with your outpatient provider to see if you should continue on torsemide 40 mg daily.  Please continue the wound care and dressing change as directed.  Your pulmonologist Dr. Welton Flakes had seen you, and wants you to be on prednisone 40 mg daily until you follow up with him in 2 weeks.  You can set up video visit if you can't leave the house.   Dr. Darlin Priestly - -   Discharge wound care:   Complete by: As directed    1. Apply Medihoney to right outer leg wounds daily, then cover with foam dressing.  (Change foam dressing Q 3 days or as needed from soiling.) 2. Foam dressing to left posterior leg, change Q 3 days or as needed from soiling. [facility] Course:  For full details, please see H&P, progress notes, consult notes and ancillary notes.  Briefly,  Erik [name] is a 66 y.o. male with medical history significant of CAD s/p PCI, HFrEF, permanent A-fib on aspirin, COPD on home 2L O2, arthritis and chronic venous stasis who presented with leg swelling and pain.  After presentation, pt developed Afib w RVR.   # LE cellulitis # Chronic venous stasis  --erythema, warmth, edema and burning pain in BLE, right worse than left.   --received ceftriaxone and vanc in the ED --completed 5 days of ceftriaxone with improvement in erythema and warmth. --received IV lasix 40 mg BID during hospitalization with stable kidney function and much improvement in LE edema. --pt was on home torsemide 10-20 mg daily which he said did not help with leg edema.  Pt was discharged on torsemide 40 mg daily for 30 days.  HH RN set up to draw BMP and Mag 2 weeks after discharge.    # Chronic venous stasis ulcer --consulted wound care RN --cont wound care per order as above --[facility] RN   # Chronic Afib w RVR --developed shortly after presentation.  Received IV dilt 10 mg f/b dilt gtt. --pt takes Lopressor 50 mg BID.  Couldn't tolerate anticoagulants, so takes ASA 325 (though he only takes every other day). --cont home Lopressor 50 mg BID --cont home ASA 325 daily   # Severe dyspnea on exertion # Advanced COPD # Chronic hypoxemic respiratory failure on 2L home O2 --has chronic dyspnea on minimal exertion, which has progressively worsened in the past 1-2 days.  RN reported O2 down to 84% on 2L just trying to get out of bed.   --pt used to see pulm Dr. Welton Flakes, however, hasn't followed up recently due to difficulty getting out of the house --started on prednisone 40 mg daily and scheduled DuoNeb, however, pt  reported no improvement, therefore, Dr. Welton FlakesKhan was requested to see pt.  Consult note was not available prior to discharge, however, with Secure chat discussion, Dr. Welton FlakesKhan deemed pt to be at his baseline, and rec continuing prednisone 40 mg daily until followup with him in 2 weeks (via video appointment). --cont home Trelegy --cont DuoNeb q6h   Chronic systolic CHF --last Echo in June 2023 with LVEF 45-50%.  CXR no acute finding.   --Takes torsemide 10 mg daily PTA.  Received IV lasix 40 mg BID during hospitalization. --cont home lopressor 50 mg BID   Discharge Diagnoses:  Principal Problem:   Leg swelling Active Problems:   Lower  extremity cellulitis   30 Day Unplanned Readmission Risk Score    Flowsheet Row ED to Hosp-Admission (Current) from 07/13/2022 in [facility]  30 Day Unplanned Readmission Risk Score (%) 16.68 Filed at 07/19/2022 0801       This score is the patient's risk of an unplanned readmission within 30 days of being discharged (0 -100%). The score is based on dignosis, age, lab data, medications, orders, and past utilization.   Low:  0-14.9   Medium: 15-21.9   High: 22-29.9   Extreme: 30 and above         Discharge Instructions:  Allergies as of 07/19/2022       Reactions   Benadryl [diphenhydramine Hcl] Shortness Of Breath   Morphine And Related Other (See Comments)   Difficulty breathing per patient         Medication List     STOP taking these medications    azithromycin 250 MG tablet Commonly known as: ZITHROMAX   gabapentin 100 MG capsule Commonly known as: NEURONTIN       TAKE these medications    albuterol 108 (90 Base) MCG/ACT inhaler Commonly known as: VENTOLIN HFA Inhale 1 puff into the lungs every 6 (six) hours as needed for wheezing or shortness of breath.   aspirin EC 325 MG tablet Take 325 mg by mouth daily.   hydrOXYzine 25 MG tablet Commonly known as: ATARAX Take 1 tablet (25 mg total) by mouth 3 (three) times daily as needed for anxiety.   ipratropium-albuterol 0.5-2.5 (3) MG/3ML Soln Commonly known as: DUONEB USE 1 VIAL VIA NEBULIZER EVERY 6 HOURS   leptospermum manuka honey Pste paste Apply 1 Application topically daily. Start taking on: July 20, 2022   metoprolol tartrate 25 MG tablet Commonly known as: LOPRESSOR TAKE TWO TABLETS BY MOUTH EVERY MORNING AND TAKE TWO TABLETS BY MOUTH EVERY EVENING   OXYGEN Inhale 2-3 L into the lungs daily. Pt uses [employer] for oxygen   predniSONE 20 MG tablet Commonly known as: DELTASONE Take 2 tablets (40 mg total) by mouth daily with breakfast for 21 days. Or until you see  your pulmonologist. Start taking on: July 20, 2022 What changed:  medication strength how much to take how to take this when to take this additional instructions   torsemide 20 MG tablet Commonly known as: DEMADEX Take 2 tablets (40 mg total) by mouth daily. What changed:  medication strength how much to take   traMADol 50 MG tablet Commonly known as: ULTRAM Take 1 tablet (50 mg total) by mouth every 6 (six) hours as needed for up to 5 days. Take one tab po bid for pain What changed:  how much to take how to take this when to take this reasons to take this   Trelegy Ellipta 100-62.5-25 MCG/ACT Aepb Generic  drug: Fluticasone-Umeclidin-Vilant INHALE 1 PUFF BY MOUTH INTO LUNGS DAILY What changed: See the new instructions.               Discharge Care Instructions  (From admission, onward)           Start     Ordered   07/19/22 0000  Discharge wound care:       Comments: 1. Apply Medihoney to right outer leg wounds daily, then cover with foam dressing.  (Change foam dressing Q 3 days or as needed from soiling.) 2. Foam dressing to left posterior leg, change Q 3 days or as needed from soiling. - -   07/19/22 1012             Follow-up Information     Lorette Ang, MD Follow up in 2 week(s).   Specialty: Pulmonary Disease Why: Please call and schedule a video appointment with Dr. Welton Flakes for 2 weeks out. Contact information: [address] [place] [address] [phone]         Sallyanne Kuster, NP Follow up in 1 week(s).   Specialty: Nurse Practitioner Contact information: [address] [place] [address] [phone]                 Allergies  Allergen Reactions   Benadryl [Diphenhydramine Hcl] Shortness Of Breath   Morphine And Related Other (See Comments)    Difficulty breathing per patient      The results of significant diagnostics from this hospitalization (including imaging, microbiology,  ancillary and laboratory) are listed below for reference.   Consultations:   Procedures/Studies: DG Chest Port 1 View  Result Date: 07/13/2022 CLINICAL DATA:  Leg swelling for 3 days.  Shortness of breath. EXAM: PORTABLE CHEST 1 VIEW COMPARISON:  01/13/2022 FINDINGS: Stable cardiomediastinal contours. Aortic atherosclerosis. Advanced changes of COPD/emphysema. No pleural effusion or edema. Interstitial prominence within the left lower lung is unchanged from prior study. No superimposed airspace disease. IMPRESSION: 1. No acute findings. 2. Advanced changes of COPD/emphysema. Electronically Signed   By: Signa Kell M.D.   On: 07/13/2022 15:18      Labs: BNP (last 3 results) Recent Labs    01/13/22 1601 01/19/22 0748 07/13/22 1457  BNP 185.2* 122.2* 287.9*   Basic Metabolic Panel: Recent Labs  Lab 07/15/22 0414 07/16/22 0505 07/17/22 0656 07/18/22 0534 07/19/22 0352  NA 135 135 136 139 138  K 3.7 4.1 3.7 3.6 4.0  CL 99 97* 97* 97* 96*  CO2 30 33* 34* 36* 34*  GLUCOSE 113* 147* 97 101* 95  BUN 18 16 16 17 18   CREATININE 0.85 0.79 0.73 0.74 0.63  CALCIUM 8.4* 8.5* 8.7* 8.5* 8.5*  MG 2.0 2.0 2.1 2.0 2.1   Liver Function Tests: Recent Labs  Lab 07/13/22 1522  AST 31  ALT 30  ALKPHOS 85  BILITOT 1.0  PROT 8.0  ALBUMIN 4.0   No results for input(s): "LIPASE", "AMYLASE" in the last 168 hours. No results for input(s): "AMMONIA" in the last 168 hours. CBC: Recent Labs  Lab 07/15/22 0414 07/16/22 0505 07/17/22 0656 07/18/22 0534 07/19/22 0352  WBC 12.9* 9.1 14.5* 12.3* 12.8*  HGB 12.5* 12.5* 13.4 12.6* 14.1  HCT 39.3 39.4 42.3 40.5 45.1  MCV 92.5 93.6 93.0 93.5 93.8  PLT 375 380 438* 452* 437*   Cardiac Enzymes: No results for input(s): "CKTOTAL", "CKMB", "CKMBINDEX", "TROPONINI" in the last 168 hours. BNP: Invalid input(s): "POCBNP" CBG: No results for input(s): "GLUCAP" in  the last 168 hours. D-Dimer No results for input(s): "DDIMER" in the last 72  hours. Hgb A1c No results for input(s): "HGBA1C" in the last 72 hours. Lipid Profile No results for input(s): "CHOL", "HDL", "LDLCALC", "TRIG", "CHOLHDL", "LDLDIRECT" in the last 72 hours. Thyroid function studies No results for input(s): "TSH", "T4TOTAL", "T3FREE", "THYROIDAB" in the last 72 hours.  Invalid input(s): "FREET3" Anemia work up No results for input(s): "VITAMINB12", "FOLATE", "FERRITIN", "TIBC", "IRON", "RETICCTPCT" in the last 72 hours. Urinalysis    Component Value Date/Time   COLORURINE YELLOW (A) 08/13/2017 2310   APPEARANCEUR Clear 02/19/2020 1035   LABSPEC 1.014 08/13/2017 2310   LABSPEC 1.013 07/18/2013 1730   PHURINE 5.0 08/13/2017 2310   GLUCOSEU Negative 02/19/2020 1035   GLUCOSEU Negative 07/18/2013 1730   HGBUR SMALL (A) 08/13/2017 2310   BILIRUBINUR Negative 02/19/2020 1035   BILIRUBINUR Negative 07/18/2013 1730   KETONESUR NEGATIVE 08/13/2017 2310   PROTEINUR Negative 02/19/2020 1035   PROTEINUR 30 (A) 08/13/2017 2310   NITRITE Negative 02/19/2020 1035   NITRITE NEGATIVE 08/13/2017 2310   LEUKOCYTESUR Negative 02/19/2020 1035   LEUKOCYTESUR Negative 07/18/2013 1730   Sepsis Labs Recent Labs  Lab 07/16/22 0505 07/17/22 0656 07/18/22 0534 07/19/22 0352  WBC 9.1 14.5* 12.3* 12.8*   Microbiology Recent Results (from the past 240 hour(s))  Resp Panel by RT-PCR (Flu A&B, Covid) Anterior Nasal Swab     Status: None   Collection Time: 07/13/22  2:57 PM   Specimen: Anterior Nasal Swab  Result Value Ref Range Status   SARS Coronavirus 2 by RT PCR NEGATIVE NEGATIVE Final    Comment: (NOTE) SARS-CoV-2 target nucleic acids are NOT DETECTED.  The SARS-CoV-2 RNA is generally detectable in upper respiratory specimens during the acute phase of infection. The lowest concentration of SARS-CoV-2 viral copies this assay can detect is 138 copies/mL. A negative result does not preclude SARS-Cov-2 infection and should not be used as the sole basis for  treatment or other patient management decisions. A negative result may occur with  improper specimen collection/handling, submission of specimen other than nasopharyngeal swab, presence of viral mutation(s) within the areas targeted by this assay, and inadequate number of viral copies(<138 copies/mL). A negative result must be combined with clinical observations, patient history, and epidemiological information. The expected result is Negative.  Fact Sheet for Patients:  BloggerCourse.com  Fact Sheet for Healthcare Providers:  SeriousBroker.it  This test is no t yet approved or cleared by the [place] FDA and  has been authorized for detection and/or diagnosis of SARS-CoV-2 by FDA under an Emergency Use Authorization (EUA). This EUA will remain  in effect (meaning this test can be used) for the duration of the COVID-19 declaration under Section 564(b)(1) of the Act, 21 U.S.C.section 360bbb-3(b)(1), unless the authorization is terminated  or revoked sooner.       Influenza A by PCR NEGATIVE NEGATIVE Final   Influenza B by PCR NEGATIVE NEGATIVE Final    Comment: (NOTE) The Xpert Xpress SARS-CoV-2/FLU/RSV plus assay is intended as an aid in the diagnosis of influenza from Nasopharyngeal swab specimens and should not be used as a sole basis for treatment. Nasal washings and aspirates are unacceptable for Xpert Xpress SARS-CoV-2/FLU/RSV testing.  Fact Sheet for Patients: BloggerCourse.com  Fact Sheet for Healthcare Providers: SeriousBroker.it  This test is not yet approved or cleared by the [place] FDA and has been authorized for detection and/or diagnosis of SARS-CoV-2 by FDA under an Emergency Use Authorization (EUA). This EUA  will remain in effect (meaning this test can be used) for the duration of the COVID-19 declaration under Section 564(b)(1) of the Act, 21  U.S.C. section 360bbb-3(b)(1), unless the authorization is terminated or revoked.  Performed at [facility], [address] [place], [place] [address]   Blood culture (single)     Status: None   Collection Time: 07/13/22  3:22 PM   Specimen: BLOOD  Result Value Ref Range Status   Specimen Description BLOOD BLOOD LEFT FOREARM  Final   Special Requests   Final    BOTTLES DRAWN AEROBIC AND ANAEROBIC Blood Culture results may not be optimal due to an excessive volume of blood received in culture bottles   Culture   Final    NO GROWTH 5 DAYS Performed at [facility], [address], [place], [place] [address]    Report Status 07/18/2022 FINAL  Final     Total time spend on discharging this patient, including the last patient exam, discussing the hospital stay, instructions for ongoing care as it relates to all pertinent caregivers, as well as preparing the medical discharge records, prescriptions, and/or referrals as applicable, is 45 minutes.    Darlin Priestly, MD  [facility] 07/19/2022, 11:09 AM

## 2022-07-21 ENCOUNTER — Telehealth: Payer: Self-pay

## 2022-07-21 DIAGNOSIS — J4489 Other specified chronic obstructive pulmonary disease: Secondary | ICD-10-CM | POA: Diagnosis not present

## 2022-07-21 DIAGNOSIS — I5032 Chronic diastolic (congestive) heart failure: Secondary | ICD-10-CM | POA: Diagnosis not present

## 2022-07-21 DIAGNOSIS — I252 Old myocardial infarction: Secondary | ICD-10-CM | POA: Diagnosis not present

## 2022-07-21 DIAGNOSIS — I279 Pulmonary heart disease, unspecified: Secondary | ICD-10-CM | POA: Diagnosis not present

## 2022-07-21 DIAGNOSIS — I110 Hypertensive heart disease with heart failure: Secondary | ICD-10-CM | POA: Diagnosis not present

## 2022-07-21 DIAGNOSIS — I2510 Atherosclerotic heart disease of native coronary artery without angina pectoris: Secondary | ICD-10-CM | POA: Diagnosis not present

## 2022-07-21 DIAGNOSIS — J9611 Chronic respiratory failure with hypoxia: Secondary | ICD-10-CM | POA: Diagnosis not present

## 2022-07-21 DIAGNOSIS — Z9181 History of falling: Secondary | ICD-10-CM | POA: Diagnosis not present

## 2022-07-21 DIAGNOSIS — I872 Venous insufficiency (chronic) (peripheral): Secondary | ICD-10-CM | POA: Diagnosis not present

## 2022-07-21 DIAGNOSIS — L97812 Non-pressure chronic ulcer of other part of right lower leg with fat layer exposed: Secondary | ICD-10-CM | POA: Diagnosis not present

## 2022-07-21 DIAGNOSIS — L03115 Cellulitis of right lower limb: Secondary | ICD-10-CM | POA: Diagnosis not present

## 2022-07-21 DIAGNOSIS — Z7952 Long term (current) use of systemic steroids: Secondary | ICD-10-CM | POA: Diagnosis not present

## 2022-07-21 DIAGNOSIS — Z955 Presence of coronary angioplasty implant and graft: Secondary | ICD-10-CM | POA: Diagnosis not present

## 2022-07-21 DIAGNOSIS — Z9981 Dependence on supplemental oxygen: Secondary | ICD-10-CM | POA: Diagnosis not present

## 2022-07-21 DIAGNOSIS — I6523 Occlusion and stenosis of bilateral carotid arteries: Secondary | ICD-10-CM | POA: Diagnosis not present

## 2022-07-21 DIAGNOSIS — I4821 Permanent atrial fibrillation: Secondary | ICD-10-CM | POA: Diagnosis not present

## 2022-07-21 DIAGNOSIS — K219 Gastro-esophageal reflux disease without esophagitis: Secondary | ICD-10-CM | POA: Diagnosis not present

## 2022-07-21 DIAGNOSIS — M1990 Unspecified osteoarthritis, unspecified site: Secondary | ICD-10-CM | POA: Diagnosis not present

## 2022-07-21 NOTE — Telephone Encounter (Signed)
Gave verbal order to [facility] [phone] for twice a week for 8 weeks and 1 prn

## 2022-07-26 ENCOUNTER — Telehealth: Payer: Self-pay

## 2022-07-26 ENCOUNTER — Telehealth (INDEPENDENT_AMBULATORY_CARE_PROVIDER_SITE_OTHER): Payer: Medicare Other | Admitting: Nurse Practitioner

## 2022-07-26 ENCOUNTER — Telehealth: Payer: Self-pay | Admitting: Nurse Practitioner

## 2022-07-26 ENCOUNTER — Encounter: Payer: Self-pay | Admitting: Nurse Practitioner

## 2022-07-26 VITALS — BP 99/67 | HR 70 | Temp 98.2°F | Resp 16 | Ht 70.0 in | Wt 230.0 lb

## 2022-07-26 DIAGNOSIS — Z9181 History of falling: Secondary | ICD-10-CM | POA: Diagnosis not present

## 2022-07-26 DIAGNOSIS — Z09 Encounter for follow-up examination after completed treatment for conditions other than malignant neoplasm: Secondary | ICD-10-CM | POA: Diagnosis not present

## 2022-07-26 DIAGNOSIS — I5032 Chronic diastolic (congestive) heart failure: Secondary | ICD-10-CM | POA: Diagnosis not present

## 2022-07-26 DIAGNOSIS — I2510 Atherosclerotic heart disease of native coronary artery without angina pectoris: Secondary | ICD-10-CM | POA: Diagnosis not present

## 2022-07-26 DIAGNOSIS — I4821 Permanent atrial fibrillation: Secondary | ICD-10-CM | POA: Diagnosis not present

## 2022-07-26 DIAGNOSIS — I890 Lymphedema, not elsewhere classified: Secondary | ICD-10-CM | POA: Diagnosis not present

## 2022-07-26 DIAGNOSIS — R262 Difficulty in walking, not elsewhere classified: Secondary | ICD-10-CM

## 2022-07-26 DIAGNOSIS — J9611 Chronic respiratory failure with hypoxia: Secondary | ICD-10-CM | POA: Diagnosis not present

## 2022-07-26 DIAGNOSIS — L97812 Non-pressure chronic ulcer of other part of right lower leg with fat layer exposed: Secondary | ICD-10-CM | POA: Diagnosis not present

## 2022-07-26 DIAGNOSIS — M1990 Unspecified osteoarthritis, unspecified site: Secondary | ICD-10-CM | POA: Diagnosis not present

## 2022-07-26 DIAGNOSIS — E669 Obesity, unspecified: Secondary | ICD-10-CM

## 2022-07-26 DIAGNOSIS — J4489 Other specified chronic obstructive pulmonary disease: Secondary | ICD-10-CM | POA: Diagnosis not present

## 2022-07-26 DIAGNOSIS — L03116 Cellulitis of left lower limb: Secondary | ICD-10-CM | POA: Diagnosis not present

## 2022-07-26 DIAGNOSIS — Z7952 Long term (current) use of systemic steroids: Secondary | ICD-10-CM | POA: Diagnosis not present

## 2022-07-26 DIAGNOSIS — L03115 Cellulitis of right lower limb: Secondary | ICD-10-CM | POA: Diagnosis not present

## 2022-07-26 DIAGNOSIS — Z9981 Dependence on supplemental oxygen: Secondary | ICD-10-CM | POA: Diagnosis not present

## 2022-07-26 DIAGNOSIS — K219 Gastro-esophageal reflux disease without esophagitis: Secondary | ICD-10-CM | POA: Diagnosis not present

## 2022-07-26 DIAGNOSIS — Z955 Presence of coronary angioplasty implant and graft: Secondary | ICD-10-CM | POA: Diagnosis not present

## 2022-07-26 DIAGNOSIS — I872 Venous insufficiency (chronic) (peripheral): Secondary | ICD-10-CM | POA: Diagnosis not present

## 2022-07-26 DIAGNOSIS — I252 Old myocardial infarction: Secondary | ICD-10-CM | POA: Diagnosis not present

## 2022-07-26 DIAGNOSIS — I6523 Occlusion and stenosis of bilateral carotid arteries: Secondary | ICD-10-CM | POA: Diagnosis not present

## 2022-07-26 DIAGNOSIS — I110 Hypertensive heart disease with heart failure: Secondary | ICD-10-CM | POA: Diagnosis not present

## 2022-07-26 DIAGNOSIS — I279 Pulmonary heart disease, unspecified: Secondary | ICD-10-CM | POA: Diagnosis not present

## 2022-07-26 MED ORDER — GABAPENTIN 100 MG PO CAPS: 100.0000 mg | ORAL_CAPSULE | Freq: Three times a day (TID) | ORAL | 2 refills | Status: DC

## 2022-07-26 MED ORDER — REVEFENACIN 175 MCG/3ML IN SOLN: 175.0000 ug | Freq: Every day | RESPIRATORY_TRACT | 2 refills | Status: DC

## 2022-07-26 MED ORDER — TRAMADOL HCL 50 MG PO TABS: 50.0000 mg | ORAL_TABLET | Freq: Four times a day (QID) | ORAL | 0 refills | Status: DC | PRN

## 2022-07-26 MED ORDER — DOXYCYCLINE HYCLATE 100 MG PO TABS: 100.0000 mg | ORAL_TABLET | Freq: Two times a day (BID) | ORAL | 0 refills | Status: AC

## 2022-07-26 NOTE — Progress Notes (Signed)
[facility] [address] [place] [address] [phone]                                   Transitional Care Clinic   [facility] Discharge Acute Issues Care Follow Up                                                                        Patient Demographics  Erik [name], is a 66 y.o. male  DOB Sep 23, 1955  MRN [id number].  Primary MD  Sallyanne Kuster, NP   Reason for TCC follow Up - left lower leg cellulitis and chronic venous stasis ulcer   Past Medical History:  Diagnosis Date   Arthritis    Asthma    Atrial fibrillation (HCC)    CHF (congestive heart failure) (HCC)    COPD (chronic obstructive pulmonary disease) (HCC)    Coronary artery disease    GERD (gastroesophageal reflux disease)    Hypertension    Myocardial infarction (HCC)    Shortness of breath dyspnea     Past Surgical History:  Procedure Laterality Date   ANGIOPLASTY  2009   CARDIAC CATHETERIZATION N/A 03/18/2015   Procedure: Left Heart Cath with Coronary Angiography;  Surgeon: Lamar Blinks, MD;  Location: [facility] INVASIVE CV LAB;  Service: Cardiovascular;  Laterality: N/A;   CHOLECYSTECTOMY  2005   CORONARY ANGIOGRAM  2009       Recent HPI and Hospital Course  Hospital Course:  For full details, please see H&P, progress notes, consult notes and ancillary notes.  Briefly,  Erik [name] is a 66 y.o. male with medical history significant of CAD s/p PCI, HFrEF, permanent A-fib on aspirin, COPD on home 2L O2, arthritis and chronic venous stasis who presented with leg swelling and pain.  After presentation, pt developed Afib w RVR.   # LE cellulitis # Chronic venous stasis  --erythema, warmth, edema and burning pain in BLE, right worse than left.  --received ceftriaxone and vanc in the ED --completed 5 days of ceftriaxone with improvement in erythema and warmth. --received IV lasix 40 mg BID during hospitalization with stable kidney function and  much improvement in LE edema. --pt was on home torsemide 10-20 mg daily which he said did not help with leg edema.  Pt was discharged on torsemide 40 mg daily for 30 days.  HH RN set up to draw BMP and Mag 2 weeks after discharge.     # Chronic venous stasis ulcer --consulted wound care RN --cont wound care per order as above --[facility] RN   # Chronic Afib w RVR --developed shortly after presentation.  Received IV dilt 10 mg f/b dilt gtt. --pt takes Lopressor 50 mg BID.  Couldn't tolerate anticoagulants, so takes ASA 325 (though he only takes every other day). --cont home Lopressor 50 mg BID --cont home ASA 325 daily   # Severe dyspnea on exertion # Advanced COPD # Chronic hypoxemic respiratory failure on 2L home O2 --has chronic dyspnea on minimal exertion, which has progressively worsened in the past 1-2 days.  RN reported O2 down to 84% on 2L just trying to  get out of bed.   --pt used to see pulm Dr. Humphrey Rolls, however, hasn't followed up recently due to difficulty getting out of the house --started on prednisone 40 mg daily and scheduled DuoNeb, however, pt reported no improvement, therefore, Dr. Humphrey Rolls was requested to see pt.  Consult note was not available prior to discharge, however, with Secure chat discussion, Dr. Humphrey Rolls deemed pt to be at his baseline, and rec continuing prednisone 40 mg daily until followup with him in 2 weeks (via video appointment). --cont home Trelegy --cont DuoNeb [id number]   Chronic systolic CHF --last Echo in June 2023 with LVEF 45-50%.  CXR no acute finding.   --Takes torsemide 10 mg daily PTA.  Received IV lasix 40 mg BID during hospitalization. --cont home lopressor 50 mg BID      [facility] Acute Care Issue to be followed in the Clinic   Principal Problem:   Leg swelling Active Problems:   Lower extremity cellulitis   Subjective:   Erik [name] today has, No headache, No chest pain, No abdominal pain - No Nausea, No new weakness tingling or numbness, No  Cough - SOB. Swelling is improving per patient  Assessment & Plan   1. Hospital discharge follow-up Tx for cellulitis as inpatient  2. Cellulitis of left lower leg Recurrent, treated during hospital stay, please finish any antibiotics prescribed at discharge  3. Chronic respiratory failure with hypoxia (HCC) Associated with severe COPD, yupelri prescribed and sent to [facility]  4. Lymphedema associated with obesity With recurrent cellulitis, has been to the wound clinic and lymphedema clinic in the past. Home health nurse coming into home to dress wound. Gabapentin and tramadol prescribed for pain   5. Ambulatory dysfunction Wheelchair bound, difficulty with ambulation, needs intensive physical therapy.   Reason for frequent admissions/ER visits   COPD Lymphedema Cellulitis CHF      Objective:   Vitals:   07/26/22 0956  BP: 99/67  Pulse: 70  Resp: 16  Temp: 98.2 F (36.8 C)  Weight: 230 lb (104.3 kg)  Height: 5\' 10"  (1.778 m)    Wt Readings from Last 3 Encounters:  07/26/22 230 lb (104.3 kg)  07/13/22 241 lb 6.5 oz (109.5 kg)  01/25/22 241 lb 6.5 oz (109.5 kg)    Allergies as of 07/26/2022       Reactions   Benadryl [diphenhydramine Hcl] Shortness Of Breath   Morphine And Related Other (See Comments)   Difficulty breathing per patient         Medication List        Accurate as of July 26, 2022 11:12 AM. If you have any questions, ask your nurse or doctor.          albuterol 108 (90 Base) MCG/ACT inhaler Commonly known as: VENTOLIN HFA Inhale 1 puff into the lungs every 6 (six) hours as needed for wheezing or shortness of breath.   aspirin EC 325 MG tablet Take 325 mg by mouth daily.   gabapentin 100 MG capsule Commonly known as: NEURONTIN Take 1 capsule (100 mg total) by mouth 3 (three) times daily. Started by: Jonetta Osgood, NP   hydrOXYzine 25 MG tablet Commonly known as: ATARAX Take 1 tablet (25 mg total) by mouth 3 (three)  times daily as needed for anxiety.   ipratropium-albuterol 0.5-2.5 (3) MG/3ML Soln Commonly known as: DUONEB USE 1 VIAL VIA NEBULIZER EVERY 6 HOURS   leptospermum manuka honey Pste paste Apply 1 Application topically daily.   metoprolol tartrate 25  MG tablet Commonly known as: LOPRESSOR TAKE TWO TABLETS BY MOUTH EVERY MORNING AND TAKE TWO TABLETS BY MOUTH EVERY EVENING   OXYGEN Inhale 2-3 L into the lungs daily. Pt uses [facility] for oxygen   predniSONE 20 MG tablet Commonly known as: DELTASONE Take 2 tablets (40 mg total) by mouth daily with breakfast for 21 days. Or until you see your pulmonologist.   revefenacin 175 MCG/3ML nebulizer solution Commonly known as: YUPELRI Take 3 mLs (175 mcg total) by nebulization daily. Started by: Jonetta Osgood, NP   torsemide 20 MG tablet Commonly known as: DEMADEX Take 2 tablets (40 mg total) by mouth daily.   traMADol 50 MG tablet Commonly known as: ULTRAM Take 1 tablet (50 mg total) by mouth every 6 (six) hours as needed for moderate pain or severe pain. Started by: Jonetta Osgood, NP   Trelegy Ellipta 100-62.5-25 MCG/ACT Aepb Generic drug: Fluticasone-Umeclidin-Vilant INHALE 1 PUFF BY MOUTH INTO LUNGS DAILY What changed: See the new instructions.         Objective data: He is alert and oriented and engages in conversation appropriately. He does not sound as though he is in any acute distress over telephone call.   Data Review   Micro Results No results found for this or any previous visit (from the past 240 hour(s)).   CBC No results for input(s): "WBC", "HGB", "HCT", "PLT", "MCV", "MCH", "MCHC", "RDW", "LYMPHSABS", "MONOABS", "EOSABS", "BASOSABS", "BANDABS" in the last 168 hours.  Invalid input(s): "NEUTRABS", "BANDSABD"  Chemistries  No results for input(s): "NA", "K", "CL", "CO2", "GLUCOSE", "BUN", "CREATININE", "CALCIUM", "MG", "AST", "ALT", "ALKPHOS", "BILITOT" in the last 168 hours.  Invalid input(s):  "GFRCGP" ------------------------------------------------------------------------------------------------------------------ estimated creatinine clearance is 106.9 mL/min (by C-G formula based on SCr of 0.63 mg/dL). ------------------------------------------------------------------------------------------------------------------ No results for input(s): "HGBA1C" in the last 72 hours. ------------------------------------------------------------------------------------------------------------------ No results for input(s): "CHOL", "HDL", "LDLCALC", "TRIG", "CHOLHDL", "LDLDIRECT" in the last 72 hours. ------------------------------------------------------------------------------------------------------------------ No results for input(s): "TSH", "T4TOTAL", "T3FREE", "THYROIDAB" in the last 72 hours.  Invalid input(s): "FREET3" ------------------------------------------------------------------------------------------------------------------ No results for input(s): "VITAMINB12", "FOLATE", "FERRITIN", "TIBC", "IRON", "RETICCTPCT" in the last 72 hours.  Coagulation profile No results for input(s): "INR", "PROTIME" in the last 168 hours.  No results for input(s): "DDIMER" in the last 72 hours.  Cardiac Enzymes No results for input(s): "CKMB", "TROPONINI", "MYOGLOBIN" in the last 168 hours.  Invalid input(s): "CK" ------------------------------------------------------------------------------------------------------------------ Invalid input(s): "POCBNP"   Time Spent in minutes  45 Time spent with patient included reviewing progress notes, labs, imaging studies, and discussing plan for follow up.     Jonetta Osgood MSN, FNP-C on 07/26/2022 at 11:12 AM   **Disclaimer: This note may have been dictated with voice recognition software. Similar sounding words can inadvertently be transcribed and this note may contain transcription errors which may not have been corrected upon publication of  note.**

## 2022-07-26 NOTE — Telephone Encounter (Signed)
07/21/22 orders faxed back to [facility]; [phone]

## 2022-07-26 NOTE — Telephone Encounter (Signed)
Antibiotic sent to pharmacy.  

## 2022-07-29 DIAGNOSIS — L03115 Cellulitis of right lower limb: Secondary | ICD-10-CM | POA: Diagnosis not present

## 2022-07-29 DIAGNOSIS — J9611 Chronic respiratory failure with hypoxia: Secondary | ICD-10-CM | POA: Diagnosis not present

## 2022-07-29 DIAGNOSIS — I4821 Permanent atrial fibrillation: Secondary | ICD-10-CM | POA: Diagnosis not present

## 2022-07-29 DIAGNOSIS — J4489 Other specified chronic obstructive pulmonary disease: Secondary | ICD-10-CM | POA: Diagnosis not present

## 2022-07-29 DIAGNOSIS — I110 Hypertensive heart disease with heart failure: Secondary | ICD-10-CM | POA: Diagnosis not present

## 2022-07-29 DIAGNOSIS — L97812 Non-pressure chronic ulcer of other part of right lower leg with fat layer exposed: Secondary | ICD-10-CM | POA: Diagnosis not present

## 2022-07-29 DIAGNOSIS — I2510 Atherosclerotic heart disease of native coronary artery without angina pectoris: Secondary | ICD-10-CM | POA: Diagnosis not present

## 2022-07-29 DIAGNOSIS — Z9181 History of falling: Secondary | ICD-10-CM | POA: Diagnosis not present

## 2022-07-29 DIAGNOSIS — I6523 Occlusion and stenosis of bilateral carotid arteries: Secondary | ICD-10-CM | POA: Diagnosis not present

## 2022-07-29 DIAGNOSIS — I872 Venous insufficiency (chronic) (peripheral): Secondary | ICD-10-CM | POA: Diagnosis not present

## 2022-07-29 DIAGNOSIS — I5032 Chronic diastolic (congestive) heart failure: Secondary | ICD-10-CM | POA: Diagnosis not present

## 2022-07-29 DIAGNOSIS — Z9981 Dependence on supplemental oxygen: Secondary | ICD-10-CM | POA: Diagnosis not present

## 2022-07-29 DIAGNOSIS — K219 Gastro-esophageal reflux disease without esophagitis: Secondary | ICD-10-CM | POA: Diagnosis not present

## 2022-07-29 DIAGNOSIS — Z7952 Long term (current) use of systemic steroids: Secondary | ICD-10-CM | POA: Diagnosis not present

## 2022-07-29 DIAGNOSIS — M1990 Unspecified osteoarthritis, unspecified site: Secondary | ICD-10-CM | POA: Diagnosis not present

## 2022-07-29 DIAGNOSIS — I279 Pulmonary heart disease, unspecified: Secondary | ICD-10-CM | POA: Diagnosis not present

## 2022-07-29 DIAGNOSIS — I252 Old myocardial infarction: Secondary | ICD-10-CM | POA: Diagnosis not present

## 2022-07-29 DIAGNOSIS — Z955 Presence of coronary angioplasty implant and graft: Secondary | ICD-10-CM | POA: Diagnosis not present

## 2022-08-02 ENCOUNTER — Telehealth: Payer: Self-pay

## 2022-08-02 DIAGNOSIS — L03115 Cellulitis of right lower limb: Secondary | ICD-10-CM | POA: Diagnosis not present

## 2022-08-02 DIAGNOSIS — I2510 Atherosclerotic heart disease of native coronary artery without angina pectoris: Secondary | ICD-10-CM | POA: Diagnosis not present

## 2022-08-02 DIAGNOSIS — M1990 Unspecified osteoarthritis, unspecified site: Secondary | ICD-10-CM | POA: Diagnosis not present

## 2022-08-02 DIAGNOSIS — I110 Hypertensive heart disease with heart failure: Secondary | ICD-10-CM | POA: Diagnosis not present

## 2022-08-02 DIAGNOSIS — J9611 Chronic respiratory failure with hypoxia: Secondary | ICD-10-CM | POA: Diagnosis not present

## 2022-08-02 DIAGNOSIS — Z955 Presence of coronary angioplasty implant and graft: Secondary | ICD-10-CM | POA: Diagnosis not present

## 2022-08-02 DIAGNOSIS — I872 Venous insufficiency (chronic) (peripheral): Secondary | ICD-10-CM | POA: Diagnosis not present

## 2022-08-02 DIAGNOSIS — I252 Old myocardial infarction: Secondary | ICD-10-CM | POA: Diagnosis not present

## 2022-08-02 DIAGNOSIS — Z7952 Long term (current) use of systemic steroids: Secondary | ICD-10-CM | POA: Diagnosis not present

## 2022-08-02 DIAGNOSIS — I6523 Occlusion and stenosis of bilateral carotid arteries: Secondary | ICD-10-CM | POA: Diagnosis not present

## 2022-08-02 DIAGNOSIS — Z9181 History of falling: Secondary | ICD-10-CM | POA: Diagnosis not present

## 2022-08-02 DIAGNOSIS — L97812 Non-pressure chronic ulcer of other part of right lower leg with fat layer exposed: Secondary | ICD-10-CM | POA: Diagnosis not present

## 2022-08-02 DIAGNOSIS — I4821 Permanent atrial fibrillation: Secondary | ICD-10-CM | POA: Diagnosis not present

## 2022-08-02 DIAGNOSIS — K219 Gastro-esophageal reflux disease without esophagitis: Secondary | ICD-10-CM | POA: Diagnosis not present

## 2022-08-02 DIAGNOSIS — J4489 Other specified chronic obstructive pulmonary disease: Secondary | ICD-10-CM | POA: Diagnosis not present

## 2022-08-02 DIAGNOSIS — I5032 Chronic diastolic (congestive) heart failure: Secondary | ICD-10-CM | POA: Diagnosis not present

## 2022-08-02 DIAGNOSIS — I279 Pulmonary heart disease, unspecified: Secondary | ICD-10-CM | POA: Diagnosis not present

## 2022-08-02 DIAGNOSIS — Z9981 Dependence on supplemental oxygen: Secondary | ICD-10-CM | POA: Diagnosis not present

## 2022-08-02 NOTE — Telephone Encounter (Signed)
Decrease dose to 20 mg daily x2 weeks then 1/2 tablet (10 mg) daily x1 week (18 tabs total) then stop

## 2022-08-02 NOTE — Telephone Encounter (Signed)
As per alyssa its ok to stopped if its make him shaking and also spoke with randy home health nurse that he can stopped and finished antibiotic not feeling better need in person appt

## 2022-08-04 ENCOUNTER — Telehealth: Payer: Self-pay

## 2022-08-04 NOTE — Telephone Encounter (Signed)
Spoke with pt his leg is same no change he said nurse did bandage he is ok advised him its worse go to ED

## 2022-08-05 ENCOUNTER — Other Ambulatory Visit: Payer: Self-pay | Admitting: Nurse Practitioner

## 2022-08-05 DIAGNOSIS — Z7952 Long term (current) use of systemic steroids: Secondary | ICD-10-CM | POA: Diagnosis not present

## 2022-08-05 DIAGNOSIS — Z955 Presence of coronary angioplasty implant and graft: Secondary | ICD-10-CM | POA: Diagnosis not present

## 2022-08-05 DIAGNOSIS — J4489 Other specified chronic obstructive pulmonary disease: Secondary | ICD-10-CM | POA: Diagnosis not present

## 2022-08-05 DIAGNOSIS — I110 Hypertensive heart disease with heart failure: Secondary | ICD-10-CM | POA: Diagnosis not present

## 2022-08-05 DIAGNOSIS — I279 Pulmonary heart disease, unspecified: Secondary | ICD-10-CM | POA: Diagnosis not present

## 2022-08-05 DIAGNOSIS — I872 Venous insufficiency (chronic) (peripheral): Secondary | ICD-10-CM | POA: Diagnosis not present

## 2022-08-05 DIAGNOSIS — I4821 Permanent atrial fibrillation: Secondary | ICD-10-CM | POA: Diagnosis not present

## 2022-08-05 DIAGNOSIS — L03115 Cellulitis of right lower limb: Secondary | ICD-10-CM | POA: Diagnosis not present

## 2022-08-05 DIAGNOSIS — I6523 Occlusion and stenosis of bilateral carotid arteries: Secondary | ICD-10-CM | POA: Diagnosis not present

## 2022-08-05 DIAGNOSIS — K219 Gastro-esophageal reflux disease without esophagitis: Secondary | ICD-10-CM | POA: Diagnosis not present

## 2022-08-05 DIAGNOSIS — I5032 Chronic diastolic (congestive) heart failure: Secondary | ICD-10-CM | POA: Diagnosis not present

## 2022-08-05 DIAGNOSIS — M1990 Unspecified osteoarthritis, unspecified site: Secondary | ICD-10-CM | POA: Diagnosis not present

## 2022-08-05 DIAGNOSIS — L97812 Non-pressure chronic ulcer of other part of right lower leg with fat layer exposed: Secondary | ICD-10-CM | POA: Diagnosis not present

## 2022-08-05 DIAGNOSIS — Z9981 Dependence on supplemental oxygen: Secondary | ICD-10-CM | POA: Diagnosis not present

## 2022-08-05 DIAGNOSIS — J9611 Chronic respiratory failure with hypoxia: Secondary | ICD-10-CM | POA: Diagnosis not present

## 2022-08-05 DIAGNOSIS — Z9181 History of falling: Secondary | ICD-10-CM | POA: Diagnosis not present

## 2022-08-05 DIAGNOSIS — I2510 Atherosclerotic heart disease of native coronary artery without angina pectoris: Secondary | ICD-10-CM | POA: Diagnosis not present

## 2022-08-05 DIAGNOSIS — I252 Old myocardial infarction: Secondary | ICD-10-CM | POA: Diagnosis not present

## 2022-08-05 NOTE — Telephone Encounter (Signed)
Please send med..

## 2022-08-06 NOTE — Telephone Encounter (Signed)
Refill sent.

## 2022-08-09 DIAGNOSIS — I252 Old myocardial infarction: Secondary | ICD-10-CM | POA: Diagnosis not present

## 2022-08-09 DIAGNOSIS — K219 Gastro-esophageal reflux disease without esophagitis: Secondary | ICD-10-CM | POA: Diagnosis not present

## 2022-08-09 DIAGNOSIS — I110 Hypertensive heart disease with heart failure: Secondary | ICD-10-CM | POA: Diagnosis not present

## 2022-08-09 DIAGNOSIS — I5032 Chronic diastolic (congestive) heart failure: Secondary | ICD-10-CM | POA: Diagnosis not present

## 2022-08-09 DIAGNOSIS — Z9981 Dependence on supplemental oxygen: Secondary | ICD-10-CM | POA: Diagnosis not present

## 2022-08-09 DIAGNOSIS — J9611 Chronic respiratory failure with hypoxia: Secondary | ICD-10-CM | POA: Diagnosis not present

## 2022-08-09 DIAGNOSIS — M1990 Unspecified osteoarthritis, unspecified site: Secondary | ICD-10-CM | POA: Diagnosis not present

## 2022-08-09 DIAGNOSIS — I6523 Occlusion and stenosis of bilateral carotid arteries: Secondary | ICD-10-CM | POA: Diagnosis not present

## 2022-08-09 DIAGNOSIS — I2510 Atherosclerotic heart disease of native coronary artery without angina pectoris: Secondary | ICD-10-CM | POA: Diagnosis not present

## 2022-08-09 DIAGNOSIS — Z955 Presence of coronary angioplasty implant and graft: Secondary | ICD-10-CM | POA: Diagnosis not present

## 2022-08-09 DIAGNOSIS — J4489 Other specified chronic obstructive pulmonary disease: Secondary | ICD-10-CM | POA: Diagnosis not present

## 2022-08-09 DIAGNOSIS — L97812 Non-pressure chronic ulcer of other part of right lower leg with fat layer exposed: Secondary | ICD-10-CM | POA: Diagnosis not present

## 2022-08-09 DIAGNOSIS — I279 Pulmonary heart disease, unspecified: Secondary | ICD-10-CM | POA: Diagnosis not present

## 2022-08-09 DIAGNOSIS — I4821 Permanent atrial fibrillation: Secondary | ICD-10-CM | POA: Diagnosis not present

## 2022-08-09 DIAGNOSIS — I872 Venous insufficiency (chronic) (peripheral): Secondary | ICD-10-CM | POA: Diagnosis not present

## 2022-08-09 DIAGNOSIS — L03115 Cellulitis of right lower limb: Secondary | ICD-10-CM | POA: Diagnosis not present

## 2022-08-09 DIAGNOSIS — Z7952 Long term (current) use of systemic steroids: Secondary | ICD-10-CM | POA: Diagnosis not present

## 2022-08-09 DIAGNOSIS — Z9181 History of falling: Secondary | ICD-10-CM | POA: Diagnosis not present

## 2022-08-16 ENCOUNTER — Ambulatory Visit: Payer: Self-pay | Admitting: Internal Medicine

## 2022-08-17 ENCOUNTER — Telehealth: Payer: Self-pay | Admitting: Nurse Practitioner

## 2022-08-17 NOTE — Telephone Encounter (Signed)
Received order from [facility] 08/17/2022. Gave to [place] for signature.

## 2022-08-18 ENCOUNTER — Other Ambulatory Visit: Payer: Self-pay | Admitting: Nurse Practitioner

## 2022-08-18 ENCOUNTER — Ambulatory Visit: Payer: Self-pay | Admitting: Internal Medicine

## 2022-08-18 DIAGNOSIS — J449 Chronic obstructive pulmonary disease, unspecified: Secondary | ICD-10-CM | POA: Diagnosis not present

## 2022-08-18 DIAGNOSIS — J439 Emphysema, unspecified: Secondary | ICD-10-CM | POA: Diagnosis not present

## 2022-08-18 NOTE — Telephone Encounter (Signed)
Please review and send 

## 2022-08-22 ENCOUNTER — Telehealth: Payer: Self-pay | Admitting: Nurse Practitioner

## 2022-08-22 NOTE — Telephone Encounter (Signed)
08/16/22 Home Health order faxed back to [phone]. Scanned-Toni

## 2022-08-24 ENCOUNTER — Telehealth: Payer: Self-pay

## 2022-08-24 MED ORDER — TORSEMIDE 20 MG PO TABS: 40.0000 mg | ORAL_TABLET | Freq: Every day | ORAL | 3 refills | Status: DC

## 2022-08-24 NOTE — Telephone Encounter (Signed)
Spoke with pt as per alyssa we send med if worse go to ED  and alyssa like to see him in 2 weeks if cannot come in at least video call

## 2022-08-26 DIAGNOSIS — Z7951 Long term (current) use of inhaled steroids: Secondary | ICD-10-CM | POA: Diagnosis not present

## 2022-08-26 DIAGNOSIS — I110 Hypertensive heart disease with heart failure: Secondary | ICD-10-CM | POA: Diagnosis not present

## 2022-08-26 DIAGNOSIS — I6523 Occlusion and stenosis of bilateral carotid arteries: Secondary | ICD-10-CM | POA: Diagnosis not present

## 2022-08-26 DIAGNOSIS — L03115 Cellulitis of right lower limb: Secondary | ICD-10-CM | POA: Diagnosis not present

## 2022-08-26 DIAGNOSIS — J9611 Chronic respiratory failure with hypoxia: Secondary | ICD-10-CM | POA: Diagnosis not present

## 2022-08-26 DIAGNOSIS — I252 Old myocardial infarction: Secondary | ICD-10-CM | POA: Diagnosis not present

## 2022-08-26 DIAGNOSIS — L97211 Non-pressure chronic ulcer of right calf limited to breakdown of skin: Secondary | ICD-10-CM | POA: Diagnosis not present

## 2022-08-26 DIAGNOSIS — I872 Venous insufficiency (chronic) (peripheral): Secondary | ICD-10-CM | POA: Diagnosis not present

## 2022-08-26 DIAGNOSIS — I70219 Atherosclerosis of native arteries of extremities with intermittent claudication, unspecified extremity: Secondary | ICD-10-CM | POA: Diagnosis not present

## 2022-08-26 DIAGNOSIS — E782 Mixed hyperlipidemia: Secondary | ICD-10-CM | POA: Diagnosis not present

## 2022-08-26 DIAGNOSIS — Z9981 Dependence on supplemental oxygen: Secondary | ICD-10-CM | POA: Diagnosis not present

## 2022-08-26 DIAGNOSIS — I2510 Atherosclerotic heart disease of native coronary artery without angina pectoris: Secondary | ICD-10-CM | POA: Diagnosis not present

## 2022-08-26 DIAGNOSIS — I5022 Chronic systolic (congestive) heart failure: Secondary | ICD-10-CM | POA: Diagnosis not present

## 2022-08-26 DIAGNOSIS — I4821 Permanent atrial fibrillation: Secondary | ICD-10-CM | POA: Diagnosis not present

## 2022-08-26 DIAGNOSIS — J4489 Other specified chronic obstructive pulmonary disease: Secondary | ICD-10-CM | POA: Diagnosis not present

## 2022-08-26 DIAGNOSIS — Z9181 History of falling: Secondary | ICD-10-CM | POA: Diagnosis not present

## 2022-08-26 DIAGNOSIS — M1990 Unspecified osteoarthritis, unspecified site: Secondary | ICD-10-CM | POA: Diagnosis not present

## 2022-08-27 ENCOUNTER — Inpatient Hospital Stay
Admission: EM | Admit: 2022-08-27 | Discharge: 2022-09-08 | DRG: 252 | Disposition: A | Discharge status: Skilled Nursing Facility | Payer: PPO | Attending: Internal Medicine | Admitting: Internal Medicine

## 2022-08-27 ENCOUNTER — Inpatient Hospital Stay: Payer: PPO

## 2022-08-27 ENCOUNTER — Other Ambulatory Visit: Payer: Self-pay

## 2022-08-27 ENCOUNTER — Emergency Department: Payer: PPO

## 2022-08-27 DIAGNOSIS — J441 Chronic obstructive pulmonary disease with (acute) exacerbation: Secondary | ICD-10-CM | POA: Diagnosis not present

## 2022-08-27 DIAGNOSIS — I639 Cerebral infarction, unspecified: Secondary | ICD-10-CM | POA: Diagnosis not present

## 2022-08-27 DIAGNOSIS — I5023 Acute on chronic systolic (congestive) heart failure: Secondary | ICD-10-CM | POA: Diagnosis not present

## 2022-08-27 DIAGNOSIS — W1830XA Fall on same level, unspecified, initial encounter: Secondary | ICD-10-CM | POA: Diagnosis present

## 2022-08-27 DIAGNOSIS — L03116 Cellulitis of left lower limb: Secondary | ICD-10-CM | POA: Diagnosis not present

## 2022-08-27 DIAGNOSIS — I4821 Permanent atrial fibrillation: Principal | ICD-10-CM | POA: Diagnosis present

## 2022-08-27 DIAGNOSIS — I700 Atherosclerosis of aorta: Secondary | ICD-10-CM | POA: Diagnosis not present

## 2022-08-27 DIAGNOSIS — I252 Old myocardial infarction: Secondary | ICD-10-CM

## 2022-08-27 DIAGNOSIS — R1312 Dysphagia, oropharyngeal phase: Secondary | ICD-10-CM | POA: Diagnosis not present

## 2022-08-27 DIAGNOSIS — D6832 Hemorrhagic disorder due to extrinsic circulating anticoagulants: Secondary | ICD-10-CM | POA: Diagnosis not present

## 2022-08-27 DIAGNOSIS — R6889 Other general symptoms and signs: Secondary | ICD-10-CM | POA: Diagnosis not present

## 2022-08-27 DIAGNOSIS — A419 Sepsis, unspecified organism: Principal | ICD-10-CM

## 2022-08-27 DIAGNOSIS — M5489 Other dorsalgia: Secondary | ICD-10-CM | POA: Diagnosis not present

## 2022-08-27 DIAGNOSIS — R29898 Other symptoms and signs involving the musculoskeletal system: Secondary | ICD-10-CM | POA: Diagnosis not present

## 2022-08-27 DIAGNOSIS — Z87891 Personal history of nicotine dependence: Secondary | ICD-10-CM

## 2022-08-27 DIAGNOSIS — S81802A Unspecified open wound, left lower leg, initial encounter: Secondary | ICD-10-CM | POA: Diagnosis not present

## 2022-08-27 DIAGNOSIS — J439 Emphysema, unspecified: Secondary | ICD-10-CM | POA: Diagnosis not present

## 2022-08-27 DIAGNOSIS — Z6835 Body mass index (BMI) 35.0-35.9, adult: Secondary | ICD-10-CM

## 2022-08-27 DIAGNOSIS — R040 Epistaxis: Secondary | ICD-10-CM | POA: Diagnosis present

## 2022-08-27 DIAGNOSIS — G8929 Other chronic pain: Secondary | ICD-10-CM | POA: Diagnosis present

## 2022-08-27 DIAGNOSIS — R21 Rash and other nonspecific skin eruption: Secondary | ICD-10-CM | POA: Diagnosis not present

## 2022-08-27 DIAGNOSIS — I70232 Atherosclerosis of native arteries of right leg with ulceration of calf: Secondary | ICD-10-CM | POA: Diagnosis present

## 2022-08-27 DIAGNOSIS — I739 Peripheral vascular disease, unspecified: Secondary | ICD-10-CM | POA: Diagnosis present

## 2022-08-27 DIAGNOSIS — J9811 Atelectasis: Secondary | ICD-10-CM | POA: Diagnosis not present

## 2022-08-27 DIAGNOSIS — R0689 Other abnormalities of breathing: Secondary | ICD-10-CM | POA: Diagnosis not present

## 2022-08-27 DIAGNOSIS — L03115 Cellulitis of right lower limb: Secondary | ICD-10-CM | POA: Diagnosis not present

## 2022-08-27 DIAGNOSIS — Z885 Allergy status to narcotic agent status: Secondary | ICD-10-CM

## 2022-08-27 DIAGNOSIS — S0990XA Unspecified injury of head, initial encounter: Secondary | ICD-10-CM | POA: Diagnosis not present

## 2022-08-27 DIAGNOSIS — R0602 Shortness of breath: Secondary | ICD-10-CM | POA: Diagnosis not present

## 2022-08-27 DIAGNOSIS — Z9981 Dependence on supplemental oxygen: Secondary | ICD-10-CM

## 2022-08-27 DIAGNOSIS — I2583 Coronary atherosclerosis due to lipid rich plaque: Secondary | ICD-10-CM

## 2022-08-27 DIAGNOSIS — I480 Paroxysmal atrial fibrillation: Secondary | ICD-10-CM | POA: Diagnosis present

## 2022-08-27 DIAGNOSIS — I70202 Unspecified atherosclerosis of native arteries of extremities, left leg: Secondary | ICD-10-CM | POA: Diagnosis present

## 2022-08-27 DIAGNOSIS — J449 Chronic obstructive pulmonary disease, unspecified: Secondary | ICD-10-CM | POA: Diagnosis not present

## 2022-08-27 DIAGNOSIS — S199XXA Unspecified injury of neck, initial encounter: Secondary | ICD-10-CM | POA: Diagnosis not present

## 2022-08-27 DIAGNOSIS — M47812 Spondylosis without myelopathy or radiculopathy, cervical region: Secondary | ICD-10-CM | POA: Diagnosis not present

## 2022-08-27 DIAGNOSIS — N179 Acute kidney failure, unspecified: Secondary | ICD-10-CM | POA: Diagnosis not present

## 2022-08-27 DIAGNOSIS — Z743 Need for continuous supervision: Secondary | ICD-10-CM | POA: Diagnosis not present

## 2022-08-27 DIAGNOSIS — R262 Difficulty in walking, not elsewhere classified: Secondary | ICD-10-CM | POA: Diagnosis not present

## 2022-08-27 DIAGNOSIS — R279 Unspecified lack of coordination: Secondary | ICD-10-CM | POA: Diagnosis not present

## 2022-08-27 DIAGNOSIS — I708 Atherosclerosis of other arteries: Secondary | ICD-10-CM | POA: Diagnosis not present

## 2022-08-27 DIAGNOSIS — M6282 Rhabdomyolysis: Secondary | ICD-10-CM | POA: Diagnosis not present

## 2022-08-27 DIAGNOSIS — I2510 Atherosclerotic heart disease of native coronary artery without angina pectoris: Secondary | ICD-10-CM | POA: Diagnosis present

## 2022-08-27 DIAGNOSIS — J432 Centrilobular emphysema: Secondary | ICD-10-CM | POA: Diagnosis not present

## 2022-08-27 DIAGNOSIS — I5022 Chronic systolic (congestive) heart failure: Secondary | ICD-10-CM | POA: Clinically undetermined

## 2022-08-27 DIAGNOSIS — I4891 Unspecified atrial fibrillation: Secondary | ICD-10-CM | POA: Diagnosis present

## 2022-08-27 DIAGNOSIS — Z833 Family history of diabetes mellitus: Secondary | ICD-10-CM

## 2022-08-27 DIAGNOSIS — I7789 Other specified disorders of arteries and arterioles: Secondary | ICD-10-CM | POA: Diagnosis not present

## 2022-08-27 DIAGNOSIS — I2699 Other pulmonary embolism without acute cor pulmonale: Secondary | ICD-10-CM | POA: Diagnosis not present

## 2022-08-27 DIAGNOSIS — E873 Alkalosis: Secondary | ICD-10-CM | POA: Diagnosis not present

## 2022-08-27 DIAGNOSIS — Z83438 Family history of other disorder of lipoprotein metabolism and other lipidemia: Secondary | ICD-10-CM

## 2022-08-27 DIAGNOSIS — I2489 Other forms of acute ischemic heart disease: Secondary | ICD-10-CM | POA: Diagnosis present

## 2022-08-27 DIAGNOSIS — I70261 Atherosclerosis of native arteries of extremities with gangrene, right leg: Secondary | ICD-10-CM | POA: Diagnosis not present

## 2022-08-27 DIAGNOSIS — R590 Localized enlarged lymph nodes: Secondary | ICD-10-CM | POA: Diagnosis not present

## 2022-08-27 DIAGNOSIS — R531 Weakness: Secondary | ICD-10-CM

## 2022-08-27 DIAGNOSIS — J440 Chronic obstructive pulmonary disease with acute lower respiratory infection: Secondary | ICD-10-CM | POA: Diagnosis not present

## 2022-08-27 DIAGNOSIS — J189 Pneumonia, unspecified organism: Secondary | ICD-10-CM | POA: Diagnosis present

## 2022-08-27 DIAGNOSIS — Z955 Presence of coronary angioplasty implant and graft: Secondary | ICD-10-CM

## 2022-08-27 DIAGNOSIS — Z79899 Other long term (current) drug therapy: Secondary | ICD-10-CM

## 2022-08-27 DIAGNOSIS — Z1152 Encounter for screening for COVID-19: Secondary | ICD-10-CM

## 2022-08-27 DIAGNOSIS — L97219 Non-pressure chronic ulcer of right calf with unspecified severity: Secondary | ICD-10-CM | POA: Diagnosis not present

## 2022-08-27 DIAGNOSIS — Z993 Dependence on wheelchair: Secondary | ICD-10-CM

## 2022-08-27 DIAGNOSIS — I499 Cardiac arrhythmia, unspecified: Secondary | ICD-10-CM | POA: Diagnosis not present

## 2022-08-27 DIAGNOSIS — R5381 Other malaise: Secondary | ICD-10-CM | POA: Diagnosis not present

## 2022-08-27 DIAGNOSIS — K219 Gastro-esophageal reflux disease without esophagitis: Secondary | ICD-10-CM | POA: Diagnosis present

## 2022-08-27 DIAGNOSIS — W19XXXA Unspecified fall, initial encounter: Secondary | ICD-10-CM

## 2022-08-27 DIAGNOSIS — L089 Local infection of the skin and subcutaneous tissue, unspecified: Secondary | ICD-10-CM | POA: Diagnosis not present

## 2022-08-27 DIAGNOSIS — I878 Other specified disorders of veins: Secondary | ICD-10-CM | POA: Diagnosis present

## 2022-08-27 DIAGNOSIS — Z8249 Family history of ischemic heart disease and other diseases of the circulatory system: Secondary | ICD-10-CM

## 2022-08-27 DIAGNOSIS — Z7951 Long term (current) use of inhaled steroids: Secondary | ICD-10-CM

## 2022-08-27 DIAGNOSIS — M6259 Muscle wasting and atrophy, not elsewhere classified, multiple sites: Secondary | ICD-10-CM | POA: Diagnosis not present

## 2022-08-27 DIAGNOSIS — Z823 Family history of stroke: Secondary | ICD-10-CM

## 2022-08-27 DIAGNOSIS — T45525A Adverse effect of antithrombotic drugs, initial encounter: Secondary | ICD-10-CM | POA: Diagnosis present

## 2022-08-27 DIAGNOSIS — I701 Atherosclerosis of renal artery: Secondary | ICD-10-CM | POA: Diagnosis not present

## 2022-08-27 DIAGNOSIS — T796XXA Traumatic ischemia of muscle, initial encounter: Secondary | ICD-10-CM | POA: Diagnosis present

## 2022-08-27 DIAGNOSIS — W19XXXD Unspecified fall, subsequent encounter: Secondary | ICD-10-CM | POA: Diagnosis not present

## 2022-08-27 DIAGNOSIS — Z7982 Long term (current) use of aspirin: Secondary | ICD-10-CM

## 2022-08-27 DIAGNOSIS — E860 Dehydration: Secondary | ICD-10-CM | POA: Diagnosis present

## 2022-08-27 DIAGNOSIS — E785 Hyperlipidemia, unspecified: Secondary | ICD-10-CM | POA: Diagnosis not present

## 2022-08-27 DIAGNOSIS — K4020 Bilateral inguinal hernia, without obstruction or gangrene, not specified as recurrent: Secondary | ICD-10-CM | POA: Diagnosis not present

## 2022-08-27 DIAGNOSIS — T402X5A Adverse effect of other opioids, initial encounter: Secondary | ICD-10-CM | POA: Diagnosis present

## 2022-08-27 DIAGNOSIS — I10 Essential (primary) hypertension: Secondary | ICD-10-CM | POA: Diagnosis not present

## 2022-08-27 DIAGNOSIS — E669 Obesity, unspecified: Secondary | ICD-10-CM | POA: Diagnosis present

## 2022-08-27 DIAGNOSIS — J9621 Acute and chronic respiratory failure with hypoxia: Secondary | ICD-10-CM | POA: Diagnosis not present

## 2022-08-27 DIAGNOSIS — I110 Hypertensive heart disease with heart failure: Secondary | ICD-10-CM | POA: Diagnosis present

## 2022-08-27 DIAGNOSIS — T45515A Adverse effect of anticoagulants, initial encounter: Secondary | ICD-10-CM | POA: Diagnosis present

## 2022-08-27 DIAGNOSIS — J90 Pleural effusion, not elsewhere classified: Secondary | ICD-10-CM | POA: Diagnosis not present

## 2022-08-27 DIAGNOSIS — G9389 Other specified disorders of brain: Secondary | ICD-10-CM | POA: Diagnosis not present

## 2022-08-27 DIAGNOSIS — I5020 Unspecified systolic (congestive) heart failure: Secondary | ICD-10-CM | POA: Diagnosis not present

## 2022-08-27 DIAGNOSIS — Z7401 Bed confinement status: Secondary | ICD-10-CM | POA: Diagnosis not present

## 2022-08-27 DIAGNOSIS — Z9049 Acquired absence of other specified parts of digestive tract: Secondary | ICD-10-CM

## 2022-08-27 DIAGNOSIS — R220 Localized swelling, mass and lump, head: Secondary | ICD-10-CM | POA: Diagnosis present

## 2022-08-27 LAB — RESP PANEL BY RT-PCR (RSV, FLU A&B, COVID)  RVPGX2
Influenza A by PCR: NEGATIVE
Influenza B by PCR: NEGATIVE
Resp Syncytial Virus by PCR: NEGATIVE
SARS Coronavirus 2 by RT PCR: NEGATIVE

## 2022-08-27 LAB — TROPONIN I (HIGH SENSITIVITY)
Troponin I (High Sensitivity): 39 ng/L — ABNORMAL HIGH (ref <18)
Troponin I (High Sensitivity): 45 ng/L — ABNORMAL HIGH (ref <18)

## 2022-08-27 LAB — COMPREHENSIVE METABOLIC PANEL
ALT: 53 U/L — ABNORMAL HIGH (ref 0–44)
AST: 117 U/L — ABNORMAL HIGH (ref 15–41)
Albumin: 3.6 g/dL (ref 3.5–5.0)
Alkaline Phosphatase: 76 U/L (ref 38–126)
Anion gap: 14 (ref 5–15)
BUN: 17 mg/dL (ref 8–23)
CO2: 30 mmol/L (ref 22–32)
Calcium: 9 mg/dL (ref 8.9–10.3)
Chloride: 91 mmol/L — ABNORMAL LOW (ref 98–111)
Creatinine, Ser: 0.82 mg/dL (ref 0.61–1.24)
GFR, Estimated: >60 mL/min (ref >60)
Glucose, Bld: 122 mg/dL — ABNORMAL HIGH (ref 70–99)
Potassium: 3.3 mmol/L — ABNORMAL LOW (ref 3.5–5.1)
Sodium: 135 mmol/L (ref 135–145)
Total Bilirubin: 2.2 mg/dL — ABNORMAL HIGH (ref 0.3–1.2)
Total Protein: 7.3 g/dL (ref 6.5–8.1)

## 2022-08-27 LAB — CBC WITH DIFFERENTIAL/PLATELET
Abs Immature Granulocytes: 0.2 10*3/uL — ABNORMAL HIGH (ref 0.00–0.07)
Basophils Absolute: 0 10*3/uL (ref 0.0–0.1)
Basophils Relative: 0 %
Eosinophils Absolute: 0 10*3/uL (ref 0.0–0.5)
Eosinophils Relative: 0 %
HCT: 45.2 % (ref 39.0–52.0)
Hemoglobin: 14.3 g/dL (ref 13.0–17.0)
Immature Granulocytes: 1 %
Lymphocytes Relative: 7 %
Lymphs Abs: 1.4 10*3/uL (ref 0.7–4.0)
MCH: 29.5 pg (ref 26.0–34.0)
MCHC: 31.6 g/dL (ref 30.0–36.0)
MCV: 93.2 fL (ref 80.0–100.0)
Monocytes Absolute: 2 10*3/uL — ABNORMAL HIGH (ref 0.1–1.0)
Monocytes Relative: 10 %
Neutro Abs: 16.5 10*3/uL — ABNORMAL HIGH (ref 1.7–7.7)
Neutrophils Relative %: 82 %
Platelets: 514 10*3/uL — ABNORMAL HIGH (ref 150–400)
RBC: 4.85 MIL/uL (ref 4.22–5.81)
RDW: 14.6 % (ref 11.5–15.5)
WBC: 20.2 10*3/uL — ABNORMAL HIGH (ref 4.0–10.5)
nRBC: 0 % (ref 0.0–0.2)

## 2022-08-27 LAB — URINALYSIS, ROUTINE W REFLEX MICROSCOPIC
Bacteria, UA: NONE SEEN
Bilirubin Urine: NEGATIVE
Glucose, UA: NEGATIVE mg/dL
Hgb urine dipstick: NEGATIVE
Ketones, ur: 20 mg/dL — AB
Leukocytes,Ua: NEGATIVE
Nitrite: NEGATIVE
Protein, ur: 30 mg/dL — AB
Specific Gravity, Urine: >1.046 — ABNORMAL HIGH (ref 1.005–1.030)
pH: 5 (ref 5.0–8.0)

## 2022-08-27 LAB — CK: Total CK: 2340 U/L — ABNORMAL HIGH (ref 49–397)

## 2022-08-27 LAB — BLOOD GAS, VENOUS

## 2022-08-27 LAB — LACTIC ACID, PLASMA
Lactic Acid, Venous: 2.4 mmol/L (ref 0.5–1.9)
Lactic Acid, Venous: 1.7 mmol/L (ref 0.5–1.9)

## 2022-08-27 LAB — PROTIME-INR
INR: 1.2 (ref 0.8–1.2)
Prothrombin Time: 15.1 seconds (ref 11.4–15.2)

## 2022-08-27 LAB — PROCALCITONIN: Procalcitonin: 0.18 ng/mL

## 2022-08-27 LAB — MAGNESIUM: Magnesium: 2.1 mg/dL (ref 1.7–2.4)

## 2022-08-27 LAB — BRAIN NATRIURETIC PEPTIDE: B Natriuretic Peptide: 548.1 pg/mL — ABNORMAL HIGH (ref 0.0–100.0)

## 2022-08-27 MED ORDER — ENOXAPARIN SODIUM 40 MG/0.4ML IJ SOSY: 40.0000 mg | PREFILLED_SYRINGE | INTRAMUSCULAR | Status: DC

## 2022-08-27 MED ORDER — ACETAMINOPHEN 325 MG PO TABS
650.0000 mg | ORAL_TABLET | Freq: Four times a day (QID) | ORAL | Status: DC | PRN
  Administered 2022-08-29 – 2022-09-07 (×7): 650 mg via ORAL
  Filled 2022-08-27 (×7): qty 2

## 2022-08-27 MED ORDER — LACTATED RINGERS IV BOLUS
1000.0000 mL | Freq: Once | INTRAVENOUS | Status: AC
  Administered 2022-08-27: 1000 mL via INTRAVENOUS

## 2022-08-27 MED ORDER — DILTIAZEM HCL-DEXTROSE 125-5 MG/125ML-% IV SOLN (PREMIX)
5.0000 mg/h | INTRAVENOUS | Status: DC
  Administered 2022-08-27: 5 mg/h via INTRAVENOUS
  Administered 2022-08-28 (×3): 15 mg/h via INTRAVENOUS
  Filled 2022-08-27 (×4): qty 125

## 2022-08-27 MED ORDER — IPRATROPIUM-ALBUTEROL 0.5-2.5 (3) MG/3ML IN SOLN
3.0000 mL | Freq: Four times a day (QID) | RESPIRATORY_TRACT | Status: DC
  Administered 2022-08-27 – 2022-08-30 (×13): 3 mL via RESPIRATORY_TRACT
  Filled 2022-08-27 (×13): qty 3

## 2022-08-27 MED ORDER — POTASSIUM CHLORIDE CRYS ER 20 MEQ PO TBCR
40.0000 meq | EXTENDED_RELEASE_TABLET | Freq: Once | ORAL | Status: AC
  Administered 2022-08-27: 40 meq via ORAL
  Filled 2022-08-27: qty 2

## 2022-08-27 MED ORDER — ACETAMINOPHEN 650 MG RE SUPP: 650.0000 mg | Freq: Four times a day (QID) | RECTAL | Status: DC | PRN

## 2022-08-27 MED ORDER — VANCOMYCIN HCL 1500 MG/300ML IV SOLN
1500.0000 mg | Freq: Once | INTRAVENOUS | Status: AC
  Administered 2022-08-27: 1500 mg via INTRAVENOUS
  Filled 2022-08-27: qty 300

## 2022-08-27 MED ORDER — UMECLIDINIUM BROMIDE 62.5 MCG/ACT IN AEPB
1.0000 | INHALATION_SPRAY | Freq: Every day | RESPIRATORY_TRACT | Status: DC
  Administered 2022-08-28 – 2022-09-08 (×12): 1 via RESPIRATORY_TRACT
  Filled 2022-08-27 (×2): qty 7

## 2022-08-27 MED ORDER — LORAZEPAM 2 MG/ML IJ SOLN
0.5000 mg | Freq: Once | INTRAMUSCULAR | Status: AC
  Administered 2022-08-27: 0.5 mg via INTRAVENOUS
  Filled 2022-08-27: qty 1

## 2022-08-27 MED ORDER — SODIUM CHLORIDE 0.9 % IV SOLN
2.0000 g | INTRAVENOUS | Status: AC
  Administered 2022-08-28 – 2022-08-30 (×3): 2 g via INTRAVENOUS
  Filled 2022-08-27 (×4): qty 20

## 2022-08-27 MED ORDER — ONDANSETRON HCL 4 MG PO TABS: 4.0000 mg | ORAL_TABLET | Freq: Four times a day (QID) | ORAL | Status: DC | PRN

## 2022-08-27 MED ORDER — VANCOMYCIN HCL IN DEXTROSE 1-5 GM/200ML-% IV SOLN
1000.0000 mg | Freq: Once | INTRAVENOUS | Status: AC
  Administered 2022-08-27: 1000 mg via INTRAVENOUS
  Filled 2022-08-27: qty 200

## 2022-08-27 MED ORDER — ONDANSETRON HCL 4 MG/2ML IJ SOLN: 4.0000 mg | Freq: Four times a day (QID) | INTRAMUSCULAR | Status: DC | PRN

## 2022-08-27 MED ORDER — ENOXAPARIN SODIUM 60 MG/0.6ML IJ SOSY
0.5000 mg/kg | PREFILLED_SYRINGE | INTRAMUSCULAR | Status: DC
  Administered 2022-08-27 – 2022-09-04 (×9): 55 mg via SUBCUTANEOUS
  Filled 2022-08-27 (×9): qty 0.6

## 2022-08-27 MED ORDER — FUROSEMIDE 10 MG/ML IJ SOLN
60.0000 mg | Freq: Once | INTRAMUSCULAR | Status: AC
  Administered 2022-08-27: 60 mg via INTRAVENOUS
  Filled 2022-08-27: qty 8

## 2022-08-27 MED ORDER — SODIUM CHLORIDE 0.9 % IV SOLN
500.0000 mg | INTRAVENOUS | Status: DC
  Administered 2022-08-27 – 2022-08-29 (×3): 500 mg via INTRAVENOUS
  Filled 2022-08-27 (×4): qty 5

## 2022-08-27 MED ORDER — FLUTICASONE FUROATE-VILANTEROL 100-25 MCG/ACT IN AEPB
1.0000 | INHALATION_SPRAY | Freq: Every day | RESPIRATORY_TRACT | Status: DC
  Administered 2022-08-28 – 2022-09-08 (×11): 1 via RESPIRATORY_TRACT
  Filled 2022-08-27: qty 28

## 2022-08-27 MED ORDER — METOPROLOL TARTRATE 5 MG/5ML IV SOLN
5.0000 mg | Freq: Once | INTRAVENOUS | Status: AC
  Administered 2022-08-27: 5 mg via INTRAVENOUS
  Filled 2022-08-27: qty 5

## 2022-08-27 MED ORDER — ASPIRIN 81 MG PO TBEC
81.0000 mg | DELAYED_RELEASE_TABLET | Freq: Every day | ORAL | Status: DC
  Administered 2022-08-27 – 2022-08-28 (×2): 81 mg via ORAL
  Filled 2022-08-27 (×2): qty 1

## 2022-08-27 MED ORDER — IOHEXOL 300 MG/ML  SOLN
100.0000 mL | Freq: Once | INTRAMUSCULAR | Status: AC | PRN
  Administered 2022-08-27: 100 mL via INTRAVENOUS

## 2022-08-27 MED ORDER — SODIUM CHLORIDE 0.9% FLUSH
3.0000 mL | Freq: Two times a day (BID) | INTRAVENOUS | Status: DC
  Administered 2022-08-27 – 2022-09-04 (×15): 3 mL via INTRAVENOUS

## 2022-08-27 MED ORDER — POLYETHYLENE GLYCOL 3350 17 G PO PACK
17.0000 g | PACK | Freq: Every day | ORAL | Status: DC
  Administered 2022-08-27 – 2022-09-04 (×4): 17 g via ORAL
  Filled 2022-08-27 (×11): qty 1

## 2022-08-27 MED ORDER — VANCOMYCIN HCL IN DEXTROSE 1-5 GM/200ML-% IV SOLN
1000.0000 mg | Freq: Two times a day (BID) | INTRAVENOUS | Status: DC
  Administered 2022-08-28 – 2022-08-29 (×3): 1000 mg via INTRAVENOUS
  Filled 2022-08-27 (×3): qty 200

## 2022-08-27 MED ORDER — SODIUM CHLORIDE 0.9 % IV SOLN
2.0000 g | Freq: Once | INTRAVENOUS | Status: AC
  Administered 2022-08-27: 2 g via INTRAVENOUS
  Filled 2022-08-27: qty 20

## 2022-08-27 NOTE — ED Notes (Signed)
Pt is already trying to pull his BIPAP mask off. NP made aware and asked for orders for ativan. Verbal orders to start with 0.5mg .

## 2022-08-27 NOTE — ED Notes (Signed)
Pt is still very restless and pulling at his BIPAP mask.

## 2022-08-27 NOTE — Progress Notes (Signed)
[facility] following for sepsis protocol. 

## 2022-08-27 NOTE — ED Notes (Signed)
Pt is back from CPOD to main. Pt placed on cardiac monitor.

## 2022-08-27 NOTE — ED Notes (Signed)
NP Hassan Rowan in the ER. She was made aware of pt RR status and not improving. Requested BIPAP and she agreed. RT called.

## 2022-08-27 NOTE — ED Notes (Signed)
RN to bedside again for low oxygen sat alarm. Pt is not wearing his oxygen. He has it up in his hair. Pt O2 is 85% on RA. Pt was educated about wearing his oxygen again.

## 2022-08-27 NOTE — ED Provider Notes (Signed)
[facility] Provider Note    Event Date/Time   First MD Initiated Contact with Patient 08/27/22 1358     (approximate)   History   Chief Complaint Fall, Wound Infection, and Atrial Fibrillation   HPI  Erik [name] is a 67 y.o. male with past medical history of hypertension, hyperlipidemia, CAD, CHF, chronic atrial fibrillation, COPD, and chronic hypoxic respiratory failure on 3 L nasal cannula who presents to the ED complaining of fall.  Patient reports that he fell last night around 2 AM while attempting to transition from his wheelchair.  He has been unable to get up off of the ground since then, reports acute on chronic pain in the middle of his back.  He does not think he hit his head or lost consciousness, denies any other areas of pain and denies difficulty breathing.  He had told EMS that his home health nurse had not come for a couple of weeks until last night.  EMS reports that patient meets sepsis criteria based off of heart rate and respiratory rate, along with evidence of infection to his right leg.     Physical Exam   Triage Vital Signs: ED Triage Vitals  Enc Vitals Group     BP      Pulse      Resp      Temp      Temp src      SpO2      Weight      Height      Head Circumference      Peak Flow      Pain Score      Pain Loc      Pain Edu?      Excl. in GC?     Most recent vital signs: Vitals:   08/27/22 1359 08/27/22 1500  BP: (!) 138/115 (!) 162/106  Pulse: (!) 152 (!) 113  Resp: (!) 31 (!) 28  Temp: (!) 97.4 F (36.3 C)   SpO2: 92% 96%    Constitutional: Alert and oriented. Eyes: Conjunctivae are normal. Head: Atraumatic. Nose: No congestion/rhinnorhea. Mouth/Throat: Mucous membranes are moist.  Neck: No midline cervical spine tenderness to palpation Cardiovascular: Tachycardic, irregularly irregular rhythm. Grossly normal heart sounds.  2+ radial and DP pulses bilaterally. Respiratory: Tachypneic with increased  respiratory effort.  No retractions. Lungs CTAB.  Tender to palpation over right lower chest wall. Gastrointestinal: Soft and tender to palpation in the right upper quadrant with no rebound or guarding. No distention. Musculoskeletal: No upper extremity bony tenderness to palpation.  1+ pitting edema to knees bilaterally with erythema to bilateral lower extremities, greater on the right.  Open wounds to right lateral lower leg with purulent drainage, surrounding warmth and tenderness to palpation noted. Neurologic:  Normal speech and language. No gross focal neurologic deficits are appreciated.    ED Results / Procedures / Treatments   Labs (all labs ordered are listed, but only abnormal results are displayed) Labs Reviewed  COMPREHENSIVE METABOLIC PANEL - Abnormal; Notable for the following components:      Result Value   Potassium 3.3 (*)    Chloride 91 (*)    Glucose, Bld 122 (*)    AST 117 (*)    ALT 53 (*)    Total Bilirubin 2.2 (*)    All other components within normal limits  LACTIC ACID, PLASMA - Abnormal; Notable for the following components:   Lactic Acid, Venous 2.4 (*)    All  other components within normal limits  CBC WITH DIFFERENTIAL/PLATELET - Abnormal; Notable for the following components:   WBC 20.2 (*)    Platelets 514 (*)    Neutro Abs 16.5 (*)    Monocytes Absolute 2.0 (*)    Abs Immature Granulocytes 0.20 (*)    All other components within normal limits  CK - Abnormal; Notable for the following components:   Total CK 2,340 (*)    All other components within normal limits  TROPONIN I (HIGH SENSITIVITY) - Abnormal; Notable for the following components:   Troponin I (High Sensitivity) 39 (*)    All other components within normal limits  CULTURE, BLOOD (ROUTINE X 2)  CULTURE, BLOOD (ROUTINE X 2)  PROTIME-INR  MAGNESIUM  LACTIC ACID, PLASMA  URINALYSIS, ROUTINE W REFLEX MICROSCOPIC     EKG  ED ECG REPORT I, Blake Divine, the attending physician,  personally viewed and interpreted this ECG.   Date: 08/27/2022  EKG Time: 13:54  Rate: 164  Rhythm: Atrial fibrillation  Axis: Normal  Intervals:none  ST&T Change: None  RADIOLOGY Chest x-ray reviewed and interpreted by me with no infiltrate, edema, or effusion.  Right tib-fib x-ray reviewed and interpreted by me with no fracture, dislocation, or signs of osteomyelitis.  PROCEDURES:  Critical Care performed: Yes, see critical care procedure note(s)  .Critical Care  Performed by: Blake Divine, MD Authorized by: Blake Divine, MD   Critical care provider statement:    Critical care time (minutes):  30   Critical care time was exclusive of:  Separately billable procedures and treating other patients and teaching time   Critical care was necessary to treat or prevent imminent or life-threatening deterioration of the following conditions:  Sepsis   Critical care was time spent personally by me on the following activities:  Development of treatment plan with patient or surrogate, discussions with consultants, evaluation of patient's response to treatment, examination of patient, ordering and review of laboratory studies, ordering and review of radiographic studies, ordering and performing treatments and interventions, pulse oximetry, re-evaluation of patient's condition and review of old charts   I assumed direction of critical care for this patient from another provider in my specialty: no     Care discussed with: admitting provider      MEDICATIONS ORDERED IN ED: Medications  vancomycin (VANCOCIN) IVPB 1000 mg/200 mL premix (has no administration in time range)  cefTRIAXone (ROCEPHIN) 2 g in sodium chloride 0.9 % 100 mL IVPB (0 g Intravenous Stopped 08/27/22 1452)  lactated ringers bolus 1,000 mL (1,000 mLs Intravenous New Bag/Given 08/27/22 1411)  metoprolol tartrate (LOPRESSOR) injection 5 mg (5 mg Intravenous Given 08/27/22 1458)     IMPRESSION / MDM / Villalba /  ED COURSE  I reviewed the triage vital signs and the nursing notes.                              67 y.o. male with past medical history of hypertension, hyperlipidemia, CAD, CHF, chronic atrial fibrillation, COPD, and chronic hypoxic respiratory failure on 3 L nasal cannula who presents to the ED following fall last night, after which he was unable to get himself up off of the floor.  Patient's presentation is most consistent with acute presentation with potential threat to life or bodily function.  Differential diagnosis includes, but is not limited to, sepsis, cellulitis, abscess, osteomyelitis, pneumonia, intra-abdominal infection, UTI, intracranial traumatic injury, cervical spine injury, other spinal  injury, rib fracture, pneumothorax, hemothorax, electrolyte abnormality, AKI.  Patient ill-appearing, tachycardic and tachypneic but maintaining oxygen saturations on his usual 3 L nasal cannula.  He is awake and alert with no focal neurologic deficits.  Presentation concerning for sepsis, particularly cellulitis affecting his right lower extremity, and we will start him on broad-spectrum antibiotics, hydrate with IV fluids.  He also had a fall last night with pain along his right chest wall and right upper quadrant of his abdomen as well as the middle of his back.  We will further assess with CT imaging of his head, cervical spine, chest/abdomen/pelvis.  No evidence of traumatic injury to his extremities, but will check x-ray of his right lower leg for evidence of osteomyelitis.  He is currently in atrial fibrillation with RVR, will resuscitate his sepsis initially and reassess his heart rate to determine need for rate control.  Labs remarkable for significant leukocytosis at 20 with no anemia, patient also with elevated lactic acid consistent with sepsis.  CK is elevated at greater than 2000, but no significant electrolyte abnormality or AKI noted.  Troponin mildly elevated and we will trend, low  suspicion for ACS at this time.  Chest x-ray is unremarkable, right tib-fib x-ray shows no evidence of osteomyelitis or other acute process.  Heart rate gradually improving following IV fluids, will give IV metoprolol for additional rate control.  Patient turned over to them, provider pending CT results and admission.      FINAL CLINICAL IMPRESSION(S) / ED DIAGNOSES   Final diagnoses:  Sepsis without acute organ dysfunction, due to unspecified organism [facility])  Cellulitis of right lower extremity  Fall, initial encounter     Rx / DC Orders   ED Discharge Orders     None        Note:  This document was prepared using Dragon voice recognition software and may include unintentional dictation errors.   Blake Divine, MD 08/27/22 [phone]

## 2022-08-27 NOTE — ED Notes (Addendum)
CT back with patient. Pt was gone to CT before this RN was assigned to him. CT did not hook him back up to his antibiotics or the monitor. Pt is a code sepsis patient. This Rn hooked patient back up to everything.

## 2022-08-27 NOTE — ED Notes (Signed)
Pt states "I can't breath". Increased oxygen to 6 liters. Will make MD aware.

## 2022-08-27 NOTE — Assessment & Plan Note (Signed)
Troponin mildly elevated today consistent with demand ischemia.  EKG with no changes.  - Continue home aspirin - Obtain lipid panel and A1c

## 2022-08-27 NOTE — ED Notes (Signed)
This RN offered male purewick to patient for comfort and ease due to increased urination due to the lasix given. Pt refusing at this time. Will continue to use urinal

## 2022-08-27 NOTE — ED Notes (Signed)
This RN offered a male purewick to the patient for ease of frequent urination due to the lasix give. Pt refused at this time and said he would use the urinal.

## 2022-08-27 NOTE — ED Notes (Signed)
RN to bedside to introduce self to patient. Pt is CAOx4 and in no acute distress.

## 2022-08-27 NOTE — Assessment & Plan Note (Addendum)
CT chest with left lower lobe opacity concerning for pneumonia.  Differential includes aspiration pneumonia as patient's wife states he routinely chokes on his food.  Treated with full course of antibiotics.  Seen by speech therapy and recommended on general aspiration precautions, but recommendation overall is for a mechanical soft diet with thin liquids

## 2022-08-27 NOTE — H&P (Signed)
History and Physical    Patient: Erik [name] ZOX:[id number] DOB: 03/02/1956 DOA: 08/27/2022 DOS: the patient was seen and examined on 08/27/2022 PCP: Sallyanne Kuster, NP  Patient coming from: Home  Chief Complaint:  Chief Complaint  Patient presents with   Fall   Wound Infection   Atrial Fibrillation   HPI: Erik [name] is a 67 y.o. male with medical history significant of CAD s/p PCI, HFrEF, permanent A-fib not on AC, COPD on home 2 L O2, chronic venous stasis who presents to the ED after a ground-level fall.  Erik [name] states that for the last 1 week, he has been experiencing increasing generalized weakness, however for the last several days, he has been more so weak on the right than the left.  His wife at bedside states that they have been noticing Erik [name] always leaning and falling towards the right even with they sit him up straight.  Last night, Erik [name] stated that he felt sudden onset worsening shortness of breath, dizziness and elevated heart rate.  Due to this, he slumped down against the wall and landed on the ground.  His wife checked his heart rate at that time and it was in the 160s.  He was not able to get up, so he laid on the ground until this morning around noon, and then called EMS.  He was down on the ground for approximately 12 hours.  Patient endorses some chest tightness while his heart was going quickly, but this self resolved and his heart rate came down.  Patient's wife states that for the last several days, his home health nurse has noted that his right lower extremity wound appears to be worsening with a increasing redness.  Erik [name] endorses worsening pain of both lower extremities.  His wife states that both lower extremities appear markedly more red than they were during his last hospitalization.  They deny any known fever, chills but endorses nausea with no vomiting.  ED course: On arrival to the ED, patient was afebrile at 97.4 with blood pressure  138/115 and pulse of 152.  He was tachypneic at 31/min.  Initial workup remarkable for WBC of 20.2, hemoglobin of 14.3, potassium 3.3, bicarb 30, BUN 17, creatinine 0.82, AST 117, ALT 53, CK of 2340, troponin of 39 and lactic acid of 2.4.  Initial imaging includes CT head, CT chest/abdomen/pelvis and CT C-spine. CT of the chest was notable for patchy airspace disease in the posterior left lower lobe associated with mild volume loss concerning for pneumonia.  Otherwise, no acute findings noted.  Patient started on vancomycin and Rocephin for community-acquired pneumonia and given one-time dose of her metoprolol for atrial fibrillation with RVR.  TRH contacted for admission.  Review of Systems: As mentioned in the history of present illness. All other systems reviewed and are negative.  Past Medical History:  Diagnosis Date   Arthritis    Asthma    Atrial fibrillation (HCC)    CHF (congestive heart failure) (HCC)    COPD (chronic obstructive pulmonary disease) (HCC)    Coronary artery disease    GERD (gastroesophageal reflux disease)    Hypertension    Myocardial infarction [facility])    Shortness of breath dyspnea    Past Surgical History:  Procedure Laterality Date   ANGIOPLASTY  2009   CARDIAC CATHETERIZATION N/A 03/18/2015   Procedure: Left Heart Cath with Coronary Angiography;  Surgeon: Lamar Blinks, MD;  Location: [facility] INVASIVE CV LAB;  Service: Cardiovascular;  Laterality: N/A;   CHOLECYSTECTOMY  2005   CORONARY ANGIOGRAM  2009   Social History:  reports that he quit smoking about 25 years ago. He has never used smokeless tobacco. He reports that he does not drink alcohol and does not use drugs.  Allergies  Allergen Reactions   Benadryl [Diphenhydramine Hcl] Shortness Of Breath   Morphine And Related Other (See Comments)    Difficulty breathing per patient     Family History  Problem Relation Age of Onset   Coronary artery disease Father    Diabetes Father    Hyperlipidemia  Father    Hypertension Father    Stroke Mother    Hyperlipidemia Mother     Prior to Admission medications   Medication Sig Start Date End Date Taking? Authorizing Provider  albuterol (VENTOLIN HFA) 108 (90 Base) MCG/ACT inhaler Inhale 1 puff into the lungs every 6 (six) hours as needed for wheezing or shortness of breath. 02/17/22   Lyndon Code, MD  aspirin EC 325 MG tablet Take 325 mg by mouth daily.    [provider]  gabapentin (NEURONTIN) 100 MG capsule Take 1 capsule (100 mg total) by mouth 3 (three) times daily. 07/26/22   Sallyanne Kuster, NP  hydrOXYzine (ATARAX) 25 MG tablet Take 1 tablet (25 mg total) by mouth 3 (three) times daily as needed for anxiety. 07/19/22   Darlin Priestly, MD  ipratropium-albuterol (DUONEB) 0.5-2.5 (3) MG/3ML SOLN USE 1 VIAL VIA NEBULIZER EVERY 6 HOURS 12/27/21   Sallyanne Kuster, NP  leptospermum manuka honey (MEDIHONEY) PSTE paste Apply 1 Application topically daily. 07/20/22   Darlin Priestly, MD  metoprolol tartrate (LOPRESSOR) 25 MG tablet TAKE TWO TABLETS BY MOUTH EVERY MORNING AND TAKE TWO TABLETS BY MOUTH EVERY EVENING 03/15/22   Iran Ouch, MD  OXYGEN Inhale 2-3 L into the lungs daily. Pt uses [employer] for oxygen    [provider]  revefenacin (YUPELRI) 175 MCG/3ML nebulizer solution Take 3 mLs (175 mcg total) by nebulization daily. 07/26/22   Sallyanne Kuster, NP  torsemide (DEMADEX) 20 MG tablet Take 2 tablets (40 mg total) by mouth daily. 08/24/22   Sallyanne Kuster, NP  traMADol (ULTRAM) 50 MG tablet TAKE 1 TABLET(50 MG) BY MOUTH EVERY 6 HOURS AS NEEDED FOR MODERATE PAIN OR SEVERE PAIN 08/18/22   Sallyanne Kuster, NP  TRELEGY ELLIPTA 100-62.5-25 MCG/ACT AEPB INHALE 1 PUFF BY MOUTH INTO LUNGS DAILY Patient taking differently: Inhale 1 puff into the lungs daily. 05/25/22   Sallyanne Kuster, NP    Physical Exam: Vitals:   08/27/22 1500 08/27/22 1605 08/27/22 1630 08/27/22 1700  BP: (!) 162/106 (!) 153/130 (!) 169/112 (!) 169/113   Pulse: (!) 113 (!) 133 (!) 128 (!) 124  Resp: (!) 28 (!) 25    Temp:      TempSrc:      SpO2: 96% 94% 97% 99%  Weight:      Height:       Physical Exam Vitals and nursing note reviewed.  Constitutional:      General: He is in acute distress.     Appearance: He is obese. He is ill-appearing.  HENT:     Head: Normocephalic and atraumatic.     Mouth/Throat:     Mouth: Mucous membranes are dry.     Pharynx: Oropharynx is clear.  Eyes:     Conjunctiva/sclera: Conjunctivae normal.     Pupils: Pupils are equal, round, and reactive to light.  Cardiovascular:     Rate  and Rhythm: Tachycardia present. Rhythm irregular.     Heart sounds: No murmur heard.    No gallop.     Comments: Bilateral pitting edema up to the superior thighs Pulmonary:     Effort: Tachypnea and accessory muscle usage present. No respiratory distress.     Breath sounds: Decreased breath sounds (Due to body habitus) and rales (Minimal Rales in the left lung base) present. No wheezing or rhonchi.  Abdominal:     General: There is distension.     Palpations: Abdomen is soft.     Tenderness: There is abdominal tenderness (Epigastric and right upper quadrant). There is no guarding.  Musculoskeletal:     Right lower leg: 2+ Pitting Edema present.     Left lower leg: 2+ Pitting Edema present.  Skin:    General: Skin is warm and dry.     Findings: Erythema present.     Comments: Bilateral lower extremity edema, with right significantly worse than left.  On the lateral aspect of the right lower extremity, there is a 4 cm x 6 cm ulcer with purulent drainage and some areas.  Significant tenderness to palpation overlying the area of erythema.  Significant induration overlying the affected area.  Please see pictures below for more details.   Neurological:     Mental Status: He is alert.  Psychiatric:        Mood and Affect: Mood is anxious.        Behavior: Behavior normal.        Thought Content: Thought content normal.         Judgment: Judgment normal.           Data Reviewed: CBC with WBC of 20.2, hemoglobin of 14.3, platelets of 514. CMP with potassium 3.3, chloride 91, bicarb 30, glucose 122, BUN 17, creatinine 0.82, calcium 9.9, anion gap 14, AST 117, ALT 53, total bilirubin 2.2.  GFR of over 60 Troponin elevated at 39 CK elevated at 2340 Lactic acid elevated 2.4 with improvement to 1.7 INR within normal limits at 1.2  EKG was personally reviewed.  Narrow complex tachycardia there is irregular consistent with atrial fibrillation with RVR.  No ST or T wave changes concerning for acute ischemia.  J-point elevation only present in V3.  CT Head Wo Contrast  Result Date: 08/27/2022 CLINICAL DATA:  Trauma. EXAM: CT HEAD WITHOUT CONTRAST CT CERVICAL SPINE WITHOUT CONTRAST TECHNIQUE: Multidetector CT imaging of the head and cervical spine was performed following the standard protocol without intravenous contrast. Multiplanar CT image reconstructions of the cervical spine were also generated. RADIATION DOSE REDUCTION: This exam was performed according to the departmental dose-optimization program which includes automated exposure control, adjustment of the mA and/or kV according to patient size and/or use of iterative reconstruction technique. COMPARISON:  None Available. FINDINGS: CT HEAD FINDINGS Brain: Ventricles and sulci are appropriate for patient's age. Periventricular and subcortical white matter hypodensities compatible with chronic microvascular ischemic changes. Encephalomalacia within the anterior right frontal lobe suggestive of chronic infarct. Additionally within the right posterior parietal lobe there is focal encephalomalacia suggestive of a chronic infarct. No evidence for acute cortically based infarct, intracranial hemorrhage, mass lesion or mass-effect. Vascular: No hyperdense vessel or unexpected calcification. Skull: Normal. Negative for fracture or focal lesion. Sinuses/Orbits: Paranasal  sinuses are well aerated. Mastoid air cells are unremarkable. Orbits are unremarkable. Other: None CT CERVICAL SPINE FINDINGS Alignment: Straightening of the normal cervical lordosis. Skull base and vertebrae: Intact. Soft tissues and spinal canal: No  prevertebral fluid or swelling. No visible canal hematoma. Disc levels: No acute fracture. Degenerative disc disease most pronounced C5-6. Left C2-3 facet fusion. Left C3-4 facet degenerative changes. Upper chest: Apical emphysematous change. Other: None IMPRESSION: 1. No acute intracranial findings. 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 2. No acute cervical spine fracture. Multilevel degenerative changes. Electronically Signed   By: Lovey Newcomer M.D.   On: 08/27/2022 15:52   CT Cervical Spine Wo Contrast  Result Date: 08/27/2022 CLINICAL DATA:  Trauma. EXAM: CT HEAD WITHOUT CONTRAST CT CERVICAL SPINE WITHOUT CONTRAST TECHNIQUE: Multidetector CT imaging of the head and cervical spine was performed following the standard protocol without intravenous contrast. Multiplanar CT image reconstructions of the cervical spine were also generated. RADIATION DOSE REDUCTION: This exam was performed according to the departmental dose-optimization program which includes automated exposure control, adjustment of the mA and/or kV according to patient size and/or use of iterative reconstruction technique. COMPARISON:  None Available. FINDINGS: CT HEAD FINDINGS Brain: Ventricles and sulci are appropriate for patient's age. Periventricular and subcortical white matter hypodensities compatible with chronic microvascular ischemic changes. Encephalomalacia within the anterior right frontal lobe suggestive of chronic infarct. Additionally within the right posterior parietal lobe there is focal encephalomalacia suggestive of a chronic infarct. No evidence for acute cortically based infarct, intracranial hemorrhage, mass lesion or mass-effect. Vascular: No hyperdense  vessel or unexpected calcification. Skull: Normal. Negative for fracture or focal lesion. Sinuses/Orbits: Paranasal sinuses are well aerated. Mastoid air cells are unremarkable. Orbits are unremarkable. Other: None CT CERVICAL SPINE FINDINGS Alignment: Straightening of the normal cervical lordosis. Skull base and vertebrae: Intact. Soft tissues and spinal canal: No prevertebral fluid or swelling. No visible canal hematoma. Disc levels: No acute fracture. Degenerative disc disease most pronounced C5-6. Left C2-3 facet fusion. Left C3-4 facet degenerative changes. Upper chest: Apical emphysematous change. Other: None IMPRESSION: 1. No acute intracranial findings. 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 2. No acute cervical spine fracture. Multilevel degenerative changes. Electronically Signed   By: Lovey Newcomer M.D.   On: 08/27/2022 15:52   CT CHEST ABDOMEN PELVIS W CONTRAST  Result Date: 08/27/2022 CLINICAL DATA:  Sepsis. EXAM: CT CHEST, ABDOMEN, AND PELVIS WITH CONTRAST TECHNIQUE: Multidetector CT imaging of the chest, abdomen and pelvis was performed following the standard protocol during bolus administration of intravenous contrast. RADIATION DOSE REDUCTION: This exam was performed according to the departmental dose-optimization program which includes automated exposure control, adjustment of the mA and/or kV according to patient size and/or use of iterative reconstruction technique. CONTRAST:  188mL OMNIPAQUE IOHEXOL 300 MG/ML  SOLN COMPARISON:  Abdomen and pelvis CT 09/04/2007. Abdomen CT 03/25/2016. Chest CT 09/09/2019. FINDINGS: CT CHEST FINDINGS Cardiovascular: Heart is enlarged. No pericardial effusion. Coronary artery calcification is evident. Mild atherosclerotic calcification is noted in the wall of the thoracic aorta. Enlargement of the pulmonary outflow tract/main pulmonary arteries suggests pulmonary arterial hypertension. Mediastinum/Nodes: Borderline lymphadenopathy in the  subcarinal station, only minimally progressive since 09/09/2019. There is no hilar lymphadenopathy. The esophagus has normal imaging features. There is no axillary lymphadenopathy. Lungs/Pleura: Centrilobular and paraseptal emphysema evident. Bullous changes noted in the lung apices. 9 x 5 mm posterior right upper lobe pulmonary nodule on 74/5 is stable comparing back to 09/09/2019 consistent with benign etiology. No followup imaging is recommended. Patchy airspace disease in the posterior left lower lobe is associated with mild volume loss. Imaging features are suspicious for pneumonia with similar dependent atelectasis in the deep costophrenic sulci bilaterally. There is a tiny  left pleural effusion with trace fluid in the cranial aspect of the major fissure. Musculoskeletal: No worrisome lytic or sclerotic osseous abnormality. CT ABDOMEN PELVIS FINDINGS Hepatobiliary: No suspicious focal abnormality within the liver parenchyma. Cholecystectomy. No intrahepatic or extrahepatic biliary dilation. Pancreas: No focal mass lesion. No dilatation of the main duct. No intraparenchymal cyst. No peripancreatic edema. Spleen: No splenomegaly. No focal mass lesion. Adrenals/Urinary Tract: No adrenal nodule or mass. Kidneys unremarkable. No evidence for hydroureter. The urinary bladder appears normal for the degree of distention. Stomach/Bowel: Stomach is unremarkable. No gastric wall thickening. No evidence of outlet obstruction. Duodenum is normally positioned as is the ligament of Treitz. No small bowel wall thickening. No small bowel dilatation. The terminal ileum is normal. The appendix is normal. No gross colonic mass. No colonic wall thickening. Vascular/Lymphatic: There is moderate atherosclerotic calcification of the abdominal aorta without aneurysm. There is no gastrohepatic or hepatoduodenal ligament lymphadenopathy. No retroperitoneal or mesenteric lymphadenopathy. 8 mm left common iliac node is stable in the  interval, likely reactive. 9 mm short axis right external iliac node on 96/3 is stable comparing back to the 2009 exam consistent with benign reactive etiology. Stable right external iliac node at upper normal for size. Reproductive: The prostate gland and seminal vesicles are unremarkable. Other: No intraperitoneal free fluid. Musculoskeletal: Small bilateral groin hernias contain only fat. Advanced degenerative changes are noted in both hips. No worrisome lytic or sclerotic osseous abnormality. Superior endplate compression deformity noted at L2, L4, and L5. IMPRESSION: 1. Patchy airspace disease in the posterior left lower lobe is associated with mild volume loss. Imaging features are suspicious for pneumonia. There is associated symmetric dependent atelectasis in the deep costophrenic sulci bilaterally. 2. Tiny left pleural effusion with trace fluid in the cranial aspect of the major fissure. 3. Enlargement of the pulmonary outflow tract/main pulmonary arteries suggests pulmonary arterial hypertension. 4. No acute findings in the abdomen or pelvis. 5. Small bilateral groin hernias contain only fat. 6. Superior endplate compression deformity at L2, L4, and L5. 7. Advanced changes of emphysema with bullous disease in the upper lungs. 8. Aortic Atherosclerosis (ICD10-I70.0) and Emphysema (ICD10-J43.9). Electronically Signed   By: Kennith Center M.D.   On: 08/27/2022 15:51   DG Tibia/Fibula Right  Result Date: 08/27/2022 CLINICAL DATA:  Right lower leg infection EXAM: RIGHT TIBIA AND FIBULA - 2 VIEW COMPARISON:  None Available. FINDINGS: Soft tissue defect noted laterally in the right calf. No acute bony abnormality. Specifically, no fracture, subluxation, or dislocation. No bone destruction to suggest osteomyelitis. No soft tissue gas. IMPRESSION: No acute bony abnormality. Electronically Signed   By: Charlett Nose M.D.   On: 08/27/2022 14:25   DG Chest Portable 1 View  Result Date: 08/27/2022 CLINICAL DATA:   Shortness of breath EXAM: PORTABLE CHEST 1 VIEW COMPARISON:  07/13/2022 FINDINGS: Cardiomegaly. No confluent airspace opacities, effusions or edema. No acute bony abnormality. Aortic atherosclerosis. IMPRESSION: Cardiomegaly.  No active disease. Electronically Signed   By: Charlett Nose M.D.   On: 08/27/2022 14:25    There are no new results to review at this time.  Assessment and Plan:  * Atrial fibrillation with RVR (HCC) Patient is presenting after sudden onset shortness of breath and palpitations overnight that led him to become weak and slide down onto the floor.  Rates as high as 152 in the ED.  He received one-time dose of metoprolol with no significant effect. Patient is not on AC due to prior refusal; CHA2DS2-VASc elevated at 4.    -  Telemetry monitoring - Diltiazem infusion with goal rate of less than 105 - Hold off on initiating anticoagulation due to possibility of CVA as noted below  CAP (community acquired pneumonia) Patient endorses several days of increased sputum production and slightly increased cough.  CT chest with left lower lobe opacity concerning for pneumonia.  Differential includes aspiration pneumonia as patient's wife states he routinely chokes on his food.  No past medical history to suggest need for anaerobic coverage for now.  - Continue supplemental oxygen to maintain oxygen saturation above 88% - Continue ceftriaxone - Start azithromycin - RVP, COVID-19 PCR, strep pneumo + Legionella urinary antigens, procalcitonin pending  COPD (chronic obstructive pulmonary disease) (HCC) Per chart review, patient currently wears 2 to 3 L of supplemental oxygen at home.  Currently on 3 L with oxygen saturation of 100%.  He is significantly tachypneic though, and with muscle spasms concerning for underlying hypercapnia.  - Continue supplemental oxygen to maintain oxygen saturation above 88% - Antibiotics as noted above - VBG pending - DuoNebs every 6 hours - Start  prednisone 40 mg daily  Rhabdomyolysis After laying on the ground for 12 hours.  No evidence of renal dysfunction at this time.  - S/p 1 L bolus in the ED - Hold further fluids at this time given 2+ pitting edema to the sacrum - Repeat CK in the a.m.  Right sided weakness Patient's wife states he has been leaning towards the right for several days now, particularly every time they set him up he leans back down.  Wife is concerned for some right-sided weakness, no significant weakness of the right upper extremity, but right lower extremity is 1 out of 5.  This may be secondary to underlying pain and swelling, however patient has many risk factors for stroke including A-fib without anticoagulation.  - MRI brain - If MRI is positive, will consult neurology at that time - Hold home antihypertensives - PT/OT/SLP - Aspirin  Bilateral cellulitis of lower leg Patient has a history of bilateral lower extremity cellulitis with admission in December 2023.  Per images taken during that admission, patient's lower extremities, including the wound on the lateral aspect of the right leg, appears significantly worsened.  There is evidence of purulent drainage.  - Start vancomycin per pharmacy dosing - Obtain ABIs  - Will obtain a right tib-fib x-ray to evaluate for osteomyelitis  Chronic systolic CHF (congestive heart failure) (HCC) Bilateral lower extremities with 2+ pitting edema up to the superior aspect of the thighs.  Unfortunately, patient also appears intravascularly dehydrated as well though.  He has received 1 L fluids thus far, will hold off on additional boluses at this time.  Last echocardiogram in June 2023 with EF of 45-50%.  - BNP pending - Hold home torsemide  CAD (coronary artery disease) Troponin mildly elevated today consistent with demand ischemia.  EKG with no changes.  - Continue home aspirin - Obtain lipid panel and A1c  Advance Care Planning:   Code Status: Full Code.   Verified by patient.  We discussed that he would be high risk for intubation as there is a possibility that extra patient would be difficult in the setting of severe COPD.  He expressed understanding but would still like full resuscitative efforts in the event of cardiac or pulmonary arrest.  Consults: None  Family Communication: Patient's wife updated at bedside  Severity of Illness: The appropriate patient status for this patient is INPATIENT. Inpatient status is judged to be reasonable and  necessary in order to provide the required intensity of service to ensure the patient's safety. The patient's presenting symptoms, physical exam findings, and initial radiographic and laboratory data in the context of their chronic comorbidities is felt to place them at high risk for further clinical deterioration. Furthermore, it is not anticipated that the patient will be medically stable for discharge from the hospital within 2 midnights of admission.   * I certify that at the point of admission it is my clinical judgment that the patient will require inpatient hospital care spanning beyond 2 midnights from the point of admission due to high intensity of service, high risk for further deterioration and high frequency of surveillance required.*  Author: Jose Persia, MD 08/27/2022 5:55 PM  For on call review [email or web address].

## 2022-08-27 NOTE — Assessment & Plan Note (Deleted)
Per chart review, patient currently wears 2 to 3 L of supplemental oxygen at home.  Currently on 3 L with oxygen saturation of 100%.  Given evidence of pneumonia, will schedule DuoNebs.

## 2022-08-27 NOTE — ED Notes (Signed)
RN to bedside for low oxygen sats reading alarm. Pt is wearing his oxygen on his forehead. Pt was educated as to wearing his oxygen in his nose.

## 2022-08-27 NOTE — Consult Note (Signed)
CODE SEPSIS - PHARMACY COMMUNICATION  **Broad Spectrum Antibiotics should be administered within 1 hour of Sepsis diagnosis**  Time Code Sepsis Called/Page Received: 1400  Antibiotics Ordered: Ceftriaxone, Vancomycin  Time of 1st antibiotic administration: 1424  Additional action taken by pharmacy: N/A  If necessary, Name of Provider/Nurse Contacted: N/A   Will M. Ouida Sills, PharmD PGY-1 Pharmacy Resident 08/27/2022 2:28 PM

## 2022-08-27 NOTE — ED Notes (Addendum)
Pt back from [place]. They was unable to do their test bc patient could not sit still.  Pt is hallucinating. Sees things that aren't there. Pt was still not wearing his oxygen correctly. This RN placed it back in his nostrils and advised for him to keep it in place and he agreed to do so.   Pt is in hospital bed and bed alarm on.

## 2022-08-27 NOTE — Assessment & Plan Note (Addendum)
Rates as high as 152 in the ED.  He received one-time dose of metoprolol with no significant effect. Patient is not on AC due to prior refusal; CHA2DS2-VASc elevated at 4.  Weaned off of Cardizem drip.  Home Lopressor increased from 50 twice daily to 100 twice daily.  As he is further diuresed, heart rate should improve

## 2022-08-27 NOTE — ED Notes (Signed)
Pt TX to [place].

## 2022-08-27 NOTE — Progress Notes (Signed)
Pharmacy Antibiotic Note  Erik [name] is a 66 y.o. male admitted on 08/27/2022 with cellulitis.  Pharmacy has been consulted for Vancomycin dosing.  Plan: Vancomycin 1 gm IV X 1 given in ED on 1/20 @ 1507 followed by additional Vanc 1500 mg IV X 1 on 1/20 @ 2143 to make total loading dose of 2500 mg.   Vancomycin 1 gm IV Q12H ordered to start on 1/21 @ 0300.  AUC = 473.1 Vanc trough = 12.8   Height: 5\' 10"  (177.8 cm) Weight: 108 kg (238 lb 1.6 oz) IBW/kg (Calculated) : 73  Temp (24hrs), Avg:98.4 F (36.9 C), Min:97.4 F (36.3 C), Max:99 F (37.2 C)  Recent Labs  Lab 08/27/22 1356 08/27/22 1607  WBC 20.2*  --   CREATININE 0.82  --   LATICACIDVEN 2.4* 1.7    Estimated Creatinine Clearance: 106.1 mL/min (by C-G formula based on SCr of 0.82 mg/dL).    Allergies  Allergen Reactions   Benadryl [Diphenhydramine Hcl] Shortness Of Breath   Morphine And Related Other (See Comments)    Difficulty breathing per patient     Antimicrobials this admission:   >>    >>   Dose adjustments this admission:   Microbiology results:  BCx:   UCx:    Sputum:    MRSA PCR:   Thank you for allowing pharmacy to be a part of this patient's care.  Ladaja Yusupov D 08/27/2022 10:16 PM

## 2022-08-27 NOTE — Progress Notes (Signed)
PHARMACIST - PHYSICIAN COMMUNICATION  CONCERNING:  Enoxaparin (Lovenox) for DVT Prophylaxis    RECOMMENDATION: Patient was prescribed enoxaprin 40mg  q24 hours for VTE prophylaxis.   Filed Weights   08/27/22 1401  Weight: 108 kg (238 lb 1.6 oz)    Body mass index is 34.16 kg/m.  Estimated Creatinine Clearance: 106.1 mL/min (by C-G formula based on SCr of 0.82 mg/dL).   Based on [place] patient is candidate for enoxaparin 0.5mg /kg TBW SQ every 24 hours based on BMI being >30.   DESCRIPTION: Pharmacy has adjusted enoxaparin dose per [facility] policy.  Patient is now receiving enoxaparin 55 mg every 24 hours    Alison Murray, PharmD Clinical Pharmacist  08/27/2022 5:41 PM

## 2022-08-27 NOTE — ED Notes (Signed)
Hospitalist at bedside 

## 2022-08-27 NOTE — ED Triage Notes (Signed)
PT arrives via EMS from home. Pt reportedly fell last night around 0200 in the morning while getting out of his wheelchair. PT was last seen yesterday. Pt reports he was laying in the floor all night until he was found. Pt is AxO, denies hitting his head during the fall. No blood thinners. Pt endorses some difficulty breathing. He has some reported wounds to bilateral lower legs.

## 2022-08-27 NOTE — Progress Notes (Addendum)
CROSS COVER NOTE  NAME: Erik [name] MRN: [id number] DOB : Aug 21, 1955  HPI/Events of Note   Notified by RN patient with increased work of breahting sats stable  Assessment and  Interventions   Assessment: Morbidly obese male with use of accessory muscles for beathing with rate 30-35 and heart rate remains elevated. Responding to lasix therapy well. Sats 95% Discussed with patient use of BIPAP to ease work of breathing. Patient agreed to try Plan: BIPAP  Ativan 0.5 mg for BIPAP tolerance Had pardoxical reaction to ativan Severe agitation  - haldol provided mild improvement for very short period ABG 7.53 - 43 - 417 - 40.8 - metabolic alkalosis possibly form loop diuretics. Dose of fentanyl given (allergy to morphine) for air hunger      Kathlene Cote NP [facility]

## 2022-08-27 NOTE — Assessment & Plan Note (Addendum)
Patient has a history of bilateral lower extremity cellulitis with admission in December 2023.  Per images taken during that admission, patient's lower extremities, including the wound on the lateral aspect of the right leg, appears significantly worsened.  There is evidence of purulent drainage.    Patient placed on IV antibiotics.  No evidence of osteomyelitis.  ABIs noted significant evidence of worsening PAD.  Vascular surgery consulted and patient underwent recannulization of right lower extremity for limb salvage with stent placement on 1/24.  Previously had been on aspirin only, changed to aspirin and Plavix which will be continued.  Patient will follow-up with vascular surgery as outpatient.  - Start vancomycin per pharmacy dosing - Obtain ABIs  - Will obtain a right tib-fib x-ray to evaluate for osteomyelitis

## 2022-08-27 NOTE — Assessment & Plan Note (Addendum)
Patient's wife states he has been leaning towards the right for several days prior to admission.  MRI negative for CVA and symptoms felt to be secondary to PAD and deconditioning.  Status post stent placement as above.  Seen by PT and OT who are recommending skilled nursing.

## 2022-08-27 NOTE — Consult Note (Signed)
PHARMACY -  BRIEF ANTIBIOTIC NOTE   Pharmacy has received consult(s) for vancomycin from an ED provider.  The patient's profile has been reviewed for ht/wt/allergies/indication/available labs.    One time order(s) placed for vancomycin 1,000 mg x 1  Further antibiotics/pharmacy consults should be ordered by admitting physician if indicated.                       Thank you,  Will M. Ouida Sills, PharmD PGY-1 Pharmacy Resident 08/27/2022 2:11 PM

## 2022-08-27 NOTE — Assessment & Plan Note (Addendum)
Treated with IV fluids.  No renal dysfunction.  Resolved.

## 2022-08-27 NOTE — Assessment & Plan Note (Addendum)
Treated with nebulizers and steroids.  Antibiotics for pneumonia as above.  Oxygenation improved and by time of discharge, patient back to baseline.

## 2022-08-27 NOTE — Assessment & Plan Note (Addendum)
Last echocardiogram in June 2023 with EF of 45-50%.  BNP on admission at 548.  Home torsemide held.  BMP rechecked on 1/31 and elevated at 900.  Patient started on IV Lasix.

## 2022-08-28 ENCOUNTER — Inpatient Hospital Stay: Payer: PPO

## 2022-08-28 DIAGNOSIS — I4891 Unspecified atrial fibrillation: Secondary | ICD-10-CM | POA: Diagnosis not present

## 2022-08-28 LAB — RESPIRATORY PANEL BY PCR

## 2022-08-28 LAB — CBC
HCT: 38.7 % — ABNORMAL LOW (ref 39.0–52.0)
Hemoglobin: 12 g/dL — ABNORMAL LOW (ref 13.0–17.0)
MCH: 29.1 pg (ref 26.0–34.0)
MCHC: 31 g/dL (ref 30.0–36.0)
MCV: 93.7 fL (ref 80.0–100.0)
Platelets: 403 10*3/uL — ABNORMAL HIGH (ref 150–400)
RBC: 4.13 MIL/uL — ABNORMAL LOW (ref 4.22–5.81)
RDW: 14.7 % (ref 11.5–15.5)
WBC: 15.8 10*3/uL — ABNORMAL HIGH (ref 4.0–10.5)
nRBC: 0 % (ref 0.0–0.2)

## 2022-08-28 LAB — BLOOD GAS, ARTERIAL
Acid-Base Excess: 11.8 mmol/L — ABNORMAL HIGH (ref 0.0–2.0)
Bicarbonate: 35.9 mmol/L — ABNORMAL HIGH (ref 20.0–28.0)
Delivery systems: POSITIVE
FIO2: 40 %
O2 Saturation: 99.6 %
Patient temperature: 37
pCO2 arterial: 43 mmHg (ref 32–48)
pH, Arterial: 7.53 — ABNORMAL HIGH (ref 7.35–7.45)
pO2, Arterial: 116 mmHg — ABNORMAL HIGH (ref 83–108)

## 2022-08-28 LAB — BASIC METABOLIC PANEL
Anion gap: 10 (ref 5–15)
BUN: 14 mg/dL (ref 8–23)
CO2: 32 mmol/L (ref 22–32)
Calcium: 8.4 mg/dL — ABNORMAL LOW (ref 8.9–10.3)
Chloride: 95 mmol/L — ABNORMAL LOW (ref 98–111)
Creatinine, Ser: 0.82 mg/dL (ref 0.61–1.24)
GFR, Estimated: >60 mL/min (ref >60)
Glucose, Bld: 119 mg/dL — ABNORMAL HIGH (ref 70–99)
Potassium: 3.3 mmol/L — ABNORMAL LOW (ref 3.5–5.1)
Sodium: 137 mmol/L (ref 135–145)

## 2022-08-28 LAB — CK: Total CK: 1443 U/L — ABNORMAL HIGH (ref 49–397)

## 2022-08-28 LAB — HEMOGLOBIN A1C
Hgb A1c MFr Bld: 6.3 % — ABNORMAL HIGH (ref 4.8–5.6)
Mean Plasma Glucose: 134.11 mg/dL

## 2022-08-28 LAB — STREP PNEUMONIAE URINARY ANTIGEN: Strep Pneumo Urinary Antigen: NEGATIVE

## 2022-08-28 LAB — D-DIMER, QUANTITATIVE: D-Dimer, Quant: 2.01 ug/mL-FEU — ABNORMAL HIGH (ref 0.00–0.50)

## 2022-08-28 LAB — CBG MONITORING, ED: Glucose-Capillary: 110 mg/dL — ABNORMAL HIGH (ref 70–99)

## 2022-08-28 LAB — MAGNESIUM: Magnesium: 1.9 mg/dL (ref 1.7–2.4)

## 2022-08-28 MED ORDER — HALOPERIDOL LACTATE 5 MG/ML IJ SOLN
INTRAMUSCULAR | Status: AC
  Administered 2022-08-28: 5 mg via INTRAVENOUS
  Filled 2022-08-28: qty 1

## 2022-08-28 MED ORDER — POTASSIUM CHLORIDE CRYS ER 20 MEQ PO TBCR
40.0000 meq | EXTENDED_RELEASE_TABLET | Freq: Once | ORAL | Status: AC
  Administered 2022-08-28: 40 meq via ORAL
  Filled 2022-08-28: qty 2

## 2022-08-28 MED ORDER — METOPROLOL TARTRATE 50 MG PO TABS
50.0000 mg | ORAL_TABLET | Freq: Two times a day (BID) | ORAL | Status: DC
  Administered 2022-08-28 – 2022-08-29 (×4): 50 mg via ORAL
  Filled 2022-08-28 (×5): qty 1

## 2022-08-28 MED ORDER — ASPIRIN 325 MG PO TBEC
325.0000 mg | DELAYED_RELEASE_TABLET | Freq: Every day | ORAL | Status: DC
  Administered 2022-08-29 – 2022-08-31 (×3): 325 mg via ORAL
  Filled 2022-08-28 (×3): qty 1

## 2022-08-28 MED ORDER — FENTANYL CITRATE PF 50 MCG/ML IJ SOSY
25.0000 ug | PREFILLED_SYRINGE | Freq: Once | INTRAMUSCULAR | Status: AC
  Administered 2022-08-28: 25 ug via INTRAVENOUS
  Filled 2022-08-28: qty 1

## 2022-08-28 MED ORDER — HALOPERIDOL LACTATE 5 MG/ML IJ SOLN: 5.0000 mg | Freq: Once | INTRAMUSCULAR | Status: AC

## 2022-08-28 NOTE — Evaluation (Signed)
Clinical/Bedside Swallow Evaluation Patient Details  Name: Erik [name] MRN: [id number] Date of Birth: 05/03/1956  Today's Date: 08/28/2022 Time: SLP Start Time (ACUTE ONLY): 34 SLP Stop Time (ACUTE ONLY): 1100 SLP Time Calculation (min) (ACUTE ONLY): 20 min  Past Medical History:  Past Medical History:  Diagnosis Date   Arthritis    Asthma    Atrial fibrillation (HCC)    CHF (congestive heart failure) (HCC)    COPD (chronic obstructive pulmonary disease) (HCC)    Coronary artery disease    GERD (gastroesophageal reflux disease)    Hypertension    Myocardial infarction ([place])    Shortness of breath dyspnea    Past Surgical History:  Past Surgical History:  Procedure Laterality Date   ANGIOPLASTY  2009   CARDIAC CATHETERIZATION N/A 03/18/2015   Procedure: Left Heart Cath with Coronary Angiography;  Surgeon: Corey Skains, MD;  Location: [facility];  Service: Cardiovascular;  Laterality: N/A;   CHOLECYSTECTOMY  2005   CORONARY ANGIOGRAM  2009   HPI:  Per H&P "Erik [name] is a 67 y.o. male with medical history significant of CAD s/p PCI, HFrEF, permanent A-fib not on AC, COPD on home 2 L O2, chronic venous stasis who presents to the ED after a ground-level fall.     Erik [name] states that for the last 1 week, he has been experiencing increasing generalized weakness, however for the last several days, he has been more so weak on the right than the left.  His wife at bedside states that they have been noticing Erik [name] always leaning and falling towards the right even with they sit him up straight.  Last night, Erik [name] stated that he felt sudden onset worsening shortness of breath, dizziness and elevated heart rate.  Due to this, he slumped down against the wall and landed on the ground.  His wife checked his heart rate at that time and it was in the 160s.  He was not able to get up, so he laid on the ground until this morning around noon, and then called EMS.  He was down  on the ground for approximately 12 hours.  Patient endorses some chest tightness while his heart was going quickly, but this self resolved and his heart rate came down.     Patient's wife states that for the last several days, his home health nurse has noted that his right lower extremity wound appears to be worsening with a increasing redness.  Erik [name] endorses worsening pain of both lower extremities.  His wife states that both lower extremities appear markedly more red than they were during his last hospitalization.  They deny any known fever, chills but endorses nausea with no vomiting.     ED course:  On arrival to the ED, patient was afebrile at 97.4 with blood pressure 138/115 and pulse of 152.  He was tachypneic at 31/min.  Initial workup remarkable for WBC of 20.2, hemoglobin of 14.3, potassium 3.3, bicarb 30, BUN 17, creatinine 0.82, AST 117, ALT 53, CK of 2340, troponin of 39 and lactic acid of 2.4.  Initial imaging includes CT head, CT chest/abdomen/pelvis and CT C-spine. CT of the chest was notable for patchy airspace disease in the posterior left lower lobe associated with mild volume loss concerning for pneumonia.  Otherwise, no acute findings noted.  Patient started on vancomycin and Rocephin for community-acquired pneumonia and given one-time dose of her metoprolol for atrial fibrillation with RVR.  TRH contacted  for admission."    Assessment / Plan / Recommendation  Clinical Impression  Pt seen for clinical swallowing evaluation. Pt with waxing/waning LOA. Able to rouse for PO intake. Cleared with RN who noted pt failed [place] Swallow Screening given inability to take consecutive sips of water and coughing with sips of water.   Oral motor examination completed and significant for edentulism and for xerostomia. No functional oral motor deficits appreciated.   Pt given trials of thin liquids (via cup and straw), nectar-thick liquids (~4 oz; cup and straw), and pureed (~2 oz). Pt did not  attempt to feed self despite cueing. Solid trials deferred due to waxing/waning LOA, edentulism, and overall functional status. Pt presents with s/sx highly concerning for pharyngeal dysphagia including immediate cough with thin liquids via cup and straw. Pharyngeal swallow appeared functional for trials of pureed and nectar-thick liquids. No overt s/sx pharyngeal dysphagia. Seemingly timely swallow initiation and seemingly adequate laryngeal elevation.   Recommend cautious initiation of a pureed diet with nectar-thick liquids with safe swallowing strategies/aspiration precautions as outlined below. Should pt exhibit increased difficulty swallowing or concern for aspiration/aspiration PNA on altered diet, please make NPO and notify SLP.   Pt is at increased risk for aspiration/aspiration PNA given mental status, dental status, respiratory status, dependence for feeding at present, and medical comorbidities.  SLP to f/u per POC for diet tolerance/clinical swallowing re-evaluation.   Pt and RN made aware of results, recommendations, and SLP POC. ?full understanding by pt.   SLP Visit Diagnosis: Dysphagia, oropharyngeal phase (R13.12)    Aspiration Risk  Mild aspiration risk;Moderate aspiration risk    Diet Recommendation Dysphagia 1 (Puree);Nectar-thick liquid   Supervision: Staff to assist with self feeding;Full supervision/cueing for compensatory strategies Compensations: Slow rate;Minimize environmental distractions;Small sips/bites Postural Changes: Seated upright at 90 degrees;Remain upright for at least 30 minutes after po intake    Other  Recommendations Oral Care Recommendations: Oral care QID;Staff/trained caregiver to provide oral care Other Recommendations: Order thickener from pharmacy    Recommendations for follow up therapy are one component of a multi-disciplinary discharge planning process, led by the attending physician.  Recommendations may be updated based on patient status,  additional functional criteria and insurance authorization.  Follow up Recommendations  (TBD)         Functional Status Assessment Patient has had a recent decline in their functional status and demonstrates the ability to make significant improvements in function in a reasonable and predictable amount of time.  Frequency and Duration min 2x/week  2 weeks       Prognosis Prognosis for Safe Diet Advancement: Fair Barriers to Reach Goals: Severity of deficits      Swallow Study   General Date of Onset: 08/27/22 HPI: Per H&P "Erik [name] is a 67 y.o. male with medical history significant of CAD s/p PCI, HFrEF, permanent A-fib not on AC, COPD on home 2 L O2, chronic venous stasis who presents to the ED after a ground-level fall.     Erik [name] states that for the last 1 week, he has been experiencing increasing generalized weakness, however for the last several days, he has been more so weak on the right than the left.  His wife at bedside states that they have been noticing Erik [name] always leaning and falling towards the right even with they sit him up straight.  Last night, Erik [name] stated that he felt sudden onset worsening shortness of breath, dizziness and elevated heart rate.  Due to this,  he slumped down against the wall and landed on the ground.  His wife checked his heart rate at that time and it was in the 160s.  He was not able to get up, so he laid on the ground until this morning around noon, and then called EMS.  He was down on the ground for approximately 12 hours.  Patient endorses some chest tightness while his heart was going quickly, but this self resolved and his heart rate came down.     Patient's wife states that for the last several days, his home health nurse has noted that his right lower extremity wound appears to be worsening with a increasing redness.  Erik [name] endorses worsening pain of both lower extremities.  His wife states that both lower extremities appear markedly  more red than they were during his last hospitalization.  They deny any known fever, chills but endorses nausea with no vomiting.     ED course:  On arrival to the ED, patient was afebrile at 97.4 with blood pressure 138/115 and pulse of 152.  He was tachypneic at 31/min.  Initial workup remarkable for WBC of 20.2, hemoglobin of 14.3, potassium 3.3, bicarb 30, BUN 17, creatinine 0.82, AST 117, ALT 53, CK of 2340, troponin of 39 and lactic acid of 2.4.  Initial imaging includes CT head, CT chest/abdomen/pelvis and CT C-spine. CT of the chest was notable for patchy airspace disease in the posterior left lower lobe associated with mild volume loss concerning for pneumonia.  Otherwise, no acute findings noted.  Patient started on vancomycin and Rocephin for community-acquired pneumonia and given one-time dose of her metoprolol for atrial fibrillation with RVR.  TRH contacted for admission." Type of Study: Bedside Swallow Evaluation Previous Swallow Assessment: unknown Diet Prior to this Study: Dysphagia 3 (soft);Thin liquids Temperature Spikes Noted: Yes (WBC 15.8) Respiratory Status: Nasal cannula (5L/min) Behavior/Cognition: Requires cueing (waxing/waning LOA) Oral Cavity Assessment: Dry Oral Care Completed by SLP: Yes Oral Cavity - Dentition: Edentulous Vision: Functional for self-feeding Self-Feeding Abilities: Needs assist (pt did not attempt to hold cup or spoon) Baseline Vocal Quality:  (mildly rough/hoarse; functional) Volitional Cough: Strong Volitional Swallow: Unable to elicit    Oral/Motor/Sensory Function Overall Oral Motor/Sensory Function: Within functional limits (no functional deficits noted)   Ice Chips Ice chips: Not tested   Thin Liquid Thin Liquid: Impaired Presentation: Cup;Straw Pharyngeal  Phase Impairments: Cough - Immediate    Nectar Thick Nectar Thick Liquid: Within functional limits Presentation: Cup;Straw   Honey Thick Honey Thick Liquid: Not tested   Puree Puree:  Within functional limits Presentation: Spoon   Solid     Solid: Not tested     Cherrie Gauze, M.S., [facility] [phone] ([place])  Clearnce Sorrel Colston Pyle 08/28/2022,11:12 AM

## 2022-08-28 NOTE — ED Notes (Signed)
Report received and this tech remains as Actuary.

## 2022-08-28 NOTE — Progress Notes (Signed)
PT Cancellation Note  Patient Details Name: Erik [name] MRN: [id number] DOB: 1956-06-07   Cancelled Treatment:    Reason Eval/Treat Not Completed: Patient not medically ready PT orders received, chart reviewed. Per MD, pt very lethargic & not appropriate for PT evaluation at this time, recommending holding PT evaluation until tomorrow.   Lavone Nian, PT, DPT 08/28/22, 12:17 PM   Waunita Schooner 08/28/2022, 12:15 PM

## 2022-08-28 NOTE — ED Notes (Addendum)
Pt wife Dru Primeau, left number [phone]) and informed this RN to let all those involved to call her if she needs to come to the hospital for any reason at any time.

## 2022-08-28 NOTE — ED Notes (Addendum)
Telesitter called to inform this RN of pt's O2 sats fluctuating as he starts to sleep. Checked on the pt, pt states he is "getting tired of sitting all the way up." Pt told we can try turning up O2 on [place] but may need to be placed bk on Bipap if O2 drops while sleeping.   at [facility]

## 2022-08-28 NOTE — ED Notes (Signed)
Pt's resp much calmer after being placed back on bipap.

## 2022-08-28 NOTE — ED Notes (Signed)
Safety sitter Dc'd, telesitter put in place.

## 2022-08-28 NOTE — ED Notes (Signed)
Pt care taken, pt is resting and he has a Actuary. Hr is 105 and showing a rhythm of A-Fib

## 2022-08-28 NOTE — ED Notes (Signed)
RT at bedside.

## 2022-08-28 NOTE — Progress Notes (Signed)
SLP Cancellation Note  Patient Details Name: Erik [name] MRN: [id number] DOB: 12/13/1955   Cancelled treatment:       Reason Eval/Treat Not Completed: Fatigue/lethargy limiting ability to participate;Patient's level of consciousness   SLP consult received and appreciated. Chart review completed. Pt off of BiPap and on [place]; however, pt unable to rouse long enough for safe PO intake. Will continue efforts as appropriate.  RN at bedside aware and in agreement.  Cherrie Gauze, M.S., [facility] [phone] Erik [name])  Erik [name] 08/28/2022, 8:59 AM

## 2022-08-28 NOTE — ED Notes (Signed)
Pt taken off BiPap and placed on 4L [place]. Pt O2 sats at 90%, placed on 5L with improvement in O2 to 95%.

## 2022-08-28 NOTE — Progress Notes (Signed)
PROGRESS NOTE    Erik [name]  OIN:[id number] DOB: 29-Sep-1955 DOA: 08/27/2022 PCP: Jonetta Osgood, NP  [id number]/ED16A  LOS: 1 day   Brief hospital course:   Assessment & Plan: Erik [name] is a 67 y.o. male with medical history significant of CAD s/p PCI, HFrEF, permanent A-fib not on AC, COPD on home 2 L O2, chronic venous stasis who presents to the ED after a ground-level fall.   Night before presentation, pt stated that he felt sudden onset worsening shortness of breath, dizziness and elevated heart rate.  Due to this, he slumped down against the wall and landed on the ground, and stayed on the ground for about 12 hours.   * Atrial fibrillation with RVR ([place]) Patient is presenting after sudden onset shortness of breath and palpitations overnight that led him to become weak and slide down onto the floor.  Rates as high as 152 in the ED.  He received one-time dose of metoprolol with no significant effect.   Pt started on dilt gtt.  Patient is not on AC due to prior refusal; CHA2DS2-VASc elevated at 4.  Pt takes ASA 325 instead. Plan: --cont dilt gtt --resume home Lopressor 50 mg BID --resume ASA 325 daily  CAP (community acquired pneumonia) Patient endorses several days of increased sputum production and slightly increased cough.  CT chest with left lower lobe opacity concerning for pneumonia.  Differential includes aspiration pneumonia as patient's wife states he routinely chokes on his food.   Plan: --cont ceftriaxone and azithromycin  COPD (chronic obstructive pulmonary disease) (HCC) Chronic hypoxemic respiratory failure on 2-3L O2 Per chart review, patient currently wears 2 to 3 L of supplemental oxygen at home.  No wheezing on examination to suggest a COPD exacerbation. Plan: --Continue supplemental O2 to keep sats between 88-92%, wean as tolerated - DuoNebs every 6 hours --cont Breo and Incruse - Hold off on prednisone for now  Rhabdomyolysis After laying on the  ground for 12 hours.  No evidence of renal dysfunction at this time. - S/p 1 L bolus in the ED Plan: - Hold further fluids at this time given edema  Right sided weakness --MRI brain neg for acute finding Plan: --PT/OT  Bilateral cellulitis of lower leg Patient has a history of bilateral lower extremity cellulitis with admission in December 2023.  Per images taken during that admission, patient's lower extremities, including the wound on the lateral aspect of the right leg, appears significantly worsened.  There is evidence of purulent drainage. --right tib-fib x-ray neg for osteomyelitis --started on vanc and ceftriaxone on admission Plan: --cont vanc and ceftriaxone --wound care consult  Chronic systolic CHF (congestive heart failure) (HCC) Bilateral lower extremities with 2+ pitting edema up to the superior aspect of the thighs.  Unfortunately, patient also appears intravascularly dehydrated as well though.  He has received 1 L fluids thus far, will hold off on additional boluses at this time.  Last echocardiogram in June 2023 with EF of 45-50%. Plan: - Hold home torsemide  CAD (coronary artery disease) Troponin mildly elevated 40's, consistent with demand ischemia.  EKG with no changes.   DVT prophylaxis: Lovenox SQ Code Status: Full code  Family Communication:  Level of care: Progressive Dispo:   The patient is from: home Anticipated d/c is to: to be determined Anticipated d/c date is: to be determined   Subjective and Interval History:  Pt has been somnolent all day.     Objective: Vitals:   08/28/22 1400 08/28/22 1430  08/28/22 1500 08/28/22 1600  BP: 125/84 118/70 127/85 (!) 164/149  Pulse: 79 68 62 80  Resp: (!) 25 (!) 23 (!) 24 (!) 21  Temp:      TempSrc:      SpO2: 96% 95% 96% 95%  Weight:      Height:        Intake/Output Summary (Last 24 hours) at 08/28/2022 1641 Last data filed at 08/28/2022 1035 Gross per 24 hour  Intake 1003 ml  Output 800 ml   Net 203 ml   Filed Weights   08/27/22 1401  Weight: 108 kg    Examination:   Constitutional: NAD, somnolent but arousable, not interactive HEENT: conjunctivae and lids normal, EOMI CV: No cyanosis.   RESP: normal respiratory effort, on 5L Extremities: erythema in BLE, ulcer present over right lateral leg SKIN: warm, dry   Data Reviewed: I have personally reviewed labs and imaging studies  Time spent: 50 minutes  Enzo Bi, MD [facility] If 7PM-7AM, please contact night-coverage 08/28/2022, 4:41 PM

## 2022-08-28 NOTE — Progress Notes (Signed)
OT Cancellation Note  Patient Details Name: Erik [name] MRN: [id number] DOB: 01-Feb-1956   Cancelled Treatment:    Reason Eval/Treat Not Completed: Patient not medically ready. OT orders received, chart reviewed. Per MD, pt very lethargic and not appropriate for evaluation at this time. Will re-attempt OT evaluation at later date/time and as pt medically appropriate.   Doneta Public 08/28/2022, 12:57 PM

## 2022-08-28 NOTE — ED Notes (Signed)
Pt's breathing is labored, scheduled neb was administered but pt's breathing did not improve. This RN called RT to have pt placed back on bipap.

## 2022-08-28 NOTE — ED Notes (Signed)
Pt currently sleeping, does arouse easily but falls back to sleep very fast. Pt currently on 5L [place] with O2 sats at 94%. Will continue to monitor.

## 2022-08-28 NOTE — ED Notes (Signed)
This RN fed pt lunch, pt was able to eat about 50% of meal. Pt remained somnolent throughout process, falling asleep between bites. Pt had no episodes of choking or swallowing difficulties.

## 2022-08-28 NOTE — ED Notes (Signed)
Pt called this RN in and asked to take the bipap off for a break. Bipap removed and [place] replaced. Pt told to call this RN if he has difficulty breathing and bipap will be resumed.

## 2022-08-29 DIAGNOSIS — L03116 Cellulitis of left lower limb: Secondary | ICD-10-CM | POA: Diagnosis not present

## 2022-08-29 DIAGNOSIS — I4891 Unspecified atrial fibrillation: Secondary | ICD-10-CM | POA: Diagnosis not present

## 2022-08-29 DIAGNOSIS — L03115 Cellulitis of right lower limb: Secondary | ICD-10-CM | POA: Diagnosis not present

## 2022-08-29 LAB — BASIC METABOLIC PANEL
Anion gap: 11 (ref 5–15)
BUN: 26 mg/dL — ABNORMAL HIGH (ref 8–23)
CO2: 29 mmol/L (ref 22–32)
Calcium: 8.3 mg/dL — ABNORMAL LOW (ref 8.9–10.3)
Chloride: 93 mmol/L — ABNORMAL LOW (ref 98–111)
Creatinine, Ser: 1.35 mg/dL — ABNORMAL HIGH (ref 0.61–1.24)
GFR, Estimated: 57 mL/min — ABNORMAL LOW (ref >60)
Glucose, Bld: 150 mg/dL — ABNORMAL HIGH (ref 70–99)
Potassium: 4.2 mmol/L (ref 3.5–5.1)
Sodium: 133 mmol/L — ABNORMAL LOW (ref 135–145)

## 2022-08-29 LAB — CBC
HCT: 40.2 % (ref 39.0–52.0)
Hemoglobin: 12.3 g/dL — ABNORMAL LOW (ref 13.0–17.0)
MCH: 29.2 pg (ref 26.0–34.0)
MCHC: 30.6 g/dL (ref 30.0–36.0)
MCV: 95.5 fL (ref 80.0–100.0)
Platelets: 406 10*3/uL — ABNORMAL HIGH (ref 150–400)
RBC: 4.21 MIL/uL — ABNORMAL LOW (ref 4.22–5.81)
RDW: 14.9 % (ref 11.5–15.5)
WBC: 18.5 10*3/uL — ABNORMAL HIGH (ref 4.0–10.5)
nRBC: 0.1 % (ref 0.0–0.2)

## 2022-08-29 LAB — MAGNESIUM: Magnesium: 2.2 mg/dL (ref 1.7–2.4)

## 2022-08-29 MED ORDER — LACTATED RINGERS IV SOLN: INTRAVENOUS | Status: AC

## 2022-08-29 NOTE — Progress Notes (Addendum)
Speech Language Pathology Treatment: Dysphagia  Patient Details Name: Erik [name] MRN: [id number] DOB: Mar 20, 1956 Today's Date: 08/29/2022 Time: 6644-0347 SLP Time Calculation (min) (ACUTE ONLY): 15 min  Assessment / Plan / Recommendation Clinical Impression  Pt appeared alert and cooperative indicated by readily conversing with clinician and participating in tx session. Nurse present upon arrival, reporting that pt ate breakfast (pureed diet with nectar-thick liquids) quickly and w/o overt s/s of dysphagia. Pt reported no issues swallowing purees w/ breakfast. Pt left with call button in reach, bed alarm set, and sitting upright w/ slight right lean in bed d/t back pain. Pt on [place] 6 L, afebrile; elevated WBC. No temp.  Administered thin liquids via straw (~2 oz) and half of graham cracker. Pt did not present with overt s/s of aspiration, exhibiting a notable improvement from yesterday's evaluation. Pt exhibited functional oropharyngeal swallow c/b adequate bolus management, functional bolus A/P transit, and seemingly timely pharyngeal swallow with clear oral cavity post-swallow. Pt demonstrated slightly prolonged mastication likely d/t edentulism.   SLP provided education on aspiration precautions including small sips/bites, breaks between swallows, and eating/drinking slowly. Pt verbalized understanding/agreement. Nursing made aware of results, recommendations, and SLP POC.  Recommend regular diet w/ chopped meats and thin liquids. Recommend no SLP follow-up d/t functional oropharyngeal swallow.   HPI HPI: Per H&P "Erik [name] is a 67 y.o. male with medical history significant of CAD s/p PCI, HFrEF, permanent A-fib not on AC, COPD on home 2 L O2, chronic venous stasis who presents to the ED after a ground-level fall.     Erik [name] states that for the last 1 week, he has been experiencing increasing generalized weakness, however for the last several days, he has been more so weak on the right  than the left.  His wife at bedside states that they have been noticing Erik [name] always leaning and falling towards the right even with they sit him up straight.  Last night, Erik [name] stated that he felt sudden onset worsening shortness of breath, dizziness and elevated heart rate.  Due to this, he slumped down against the wall and landed on the ground.  His wife checked his heart rate at that time and it was in the 160s.  He was not able to get up, so he laid on the ground until this morning around noon, and then called EMS.  He was down on the ground for approximately 12 hours.  Patient endorses some chest tightness while his heart was going quickly, but this self resolved and his heart rate came down.     Patient's wife states that for the last several days, his home health nurse has noted that his right lower extremity wound appears to be worsening with a increasing redness.  Erik [name] endorses worsening pain of both lower extremities.  His wife states that both lower extremities appear markedly more red than they were during his last hospitalization.  They deny any known fever, chills but endorses nausea with no vomiting.     ED course:  On arrival to the ED, patient was afebrile at 97.4 with blood pressure 138/115 and pulse of 152.  He was tachypneic at 31/min.  Initial workup remarkable for WBC of 20.2, hemoglobin of 14.3, potassium 3.3, bicarb 30, BUN 17, creatinine 0.82, AST 117, ALT 53, CK of 2340, troponin of 39 and lactic acid of 2.4.  Initial imaging includes CT head, CT chest/abdomen/pelvis and CT C-spine. CT of the chest was notable for patchy  airspace disease in the posterior left lower lobe associated with mild volume loss concerning for pneumonia.  Otherwise, no acute findings noted.  Patient started on vancomycin and Rocephin for community-acquired pneumonia and given one-time dose of her metoprolol for atrial fibrillation with RVR.  TRH contacted for admission."      SLP Plan  All goals  met      Recommendations for follow up therapy are one component of a multi-disciplinary discharge planning process, led by the attending physician.  Recommendations may be updated based on patient status, additional functional criteria and insurance authorization.    Recommendations  Diet recommendations: Thin liquid;Regular (Chopped meats) Liquids provided via: Straw;Cup Medication Administration: Whole meds with liquid (As tolerated) Supervision: Patient able to self feed Compensations: Minimize environmental distractions;Slow rate;Small sips/bites Postural Changes and/or Swallow Maneuvers: Seated upright 90 degrees;Upright 30-60 min after meal                Oral Care Recommendations: Oral care before and after PO;Patient independent with oral care Follow Up Recommendations: No SLP follow up Assistance recommended at discharge: None SLP Visit Diagnosis: Dysphagia, oral phase (R13.11) Plan: All goals met       [name] Graduate Clinician [place], Speech Pathology     [name]  08/29/2022, 9:14 AM

## 2022-08-29 NOTE — Consult Note (Signed)
WOC Nurse Consult Note: Reason for Consult: Right lateral leg wound Wound type: mixed etiology; full thickness wound right lateral calf  Pressure Injury POA: NA Measurement: see nursing flow sheet, MD notes to be 4cmx 6cm Wound bed:100% yellow, which has worsened since last admission in  12/23 Drainage (amount, consistency, odor) noted to be purulent per notes Periwound: erythema , induration and pain noted per ED staff  IMPRESSION: Although ankle-brachial indices are within normal limits, there are monophasic bilateral lower extremity arterial waveforms which suggest that there is underlying arterial occlusive disease. If clinical suspicion remains high for arterial occlusive disease, further evaluation with CT angiography runoff of the lower extremity should be performed.  Dressing procedure/placement/frequency: I am hesitant to order debridement, because of results of ABIs above.  I will be conservative at this time. Xeroform, kerlix and ACE wrap. Consider vascular evaluation.   Consider follow up in a wound care center of the patient's choice.     Re consult if needed, will not follow at this time. Thanks  Divya Munshi [employer], RN,CWOCN, CNS, [place] [phone])

## 2022-08-29 NOTE — ED Notes (Signed)
Pt's HR started going below 60, cardizem drip had been decreased when HR was in the 70's as charted. Drip is now stopped per instructions and page has been placed for provider.

## 2022-08-29 NOTE — ED Notes (Signed)
Pt was repositioned and bed pads were changed out as well as his male purewick was changed out.

## 2022-08-29 NOTE — Consult Note (Signed)
Hospital Consult    Reason for Consult:  Bilateral leg swelling with Ulcer lateral right lower extremity Requesting Physician:  Dr Enzo Bi MD MRN #:  [id number]  History of Present Illness: This is a 67 y.o. male with medical history significant of CAD s/p PCI, HFrEF, permanent A-fib not on AC, COPD on home 2 L O2, chronic venous stasis who presents to the ED after a ground-level fall. Night before presentation, pt stated that he felt sudden onset worsening shortness of breath, dizziness and elevated heart rate. Patient receiving inpatient treatment for A-fib, CAP, acute on chronic respiratory failure from COPD, rhabdomyolysis, and bilateral LE cellulitis. Pt's head CT scan negative for acute findings, though patient experiencing R side weakness.   Past Medical History:  Diagnosis Date   Arthritis    Asthma    Atrial fibrillation (HCC)    CHF (congestive heart failure) (HCC)    COPD (chronic obstructive pulmonary disease) (HCC)    Coronary artery disease    GERD (gastroesophageal reflux disease)    Hypertension    Myocardial infarction [facility])    Shortness of breath dyspnea     Past Surgical History:  Procedure Laterality Date   ANGIOPLASTY  2009   CARDIAC CATHETERIZATION N/A 03/18/2015   Procedure: Left Heart Cath with Coronary Angiography;  Surgeon: Corey Skains, MD;  Location: [place] [facility];  Service: Cardiovascular;  Laterality: N/A;   CHOLECYSTECTOMY  2005   CORONARY ANGIOGRAM  2009    Allergies  Allergen Reactions   Benadryl [Diphenhydramine Hcl] Shortness Of Breath   Morphine And Related Other (See Comments)    Difficulty breathing per patient     Prior to Admission medications   Medication Sig Start Date End Date Taking? Authorizing Provider  albuterol (VENTOLIN HFA) 108 (90 Base) MCG/ACT inhaler Inhale 1 puff into the lungs every 6 (six) hours as needed for wheezing or shortness of breath. 02/17/22  Yes Lavera Guise, MD  aspirin EC 325 MG tablet Take 325  mg by mouth daily.   Yes [provider]  gabapentin (NEURONTIN) 100 MG capsule Take 1 capsule (100 mg total) by mouth 3 (three) times daily. 07/26/22  Yes Abernathy, Yetta Flock, NP  hydrOXYzine (ATARAX) 25 MG tablet Take 1 tablet (25 mg total) by mouth 3 (three) times daily as needed for anxiety. 07/19/22  Yes Enzo Bi, MD  ipratropium-albuterol (DUONEB) 0.5-2.5 (3) MG/3ML SOLN USE 1 VIAL VIA NEBULIZER EVERY 6 HOURS 12/27/21  Yes Abernathy, Yetta Flock, NP  leptospermum manuka honey (MEDIHONEY) PSTE paste Apply 1 Application topically daily. 07/20/22  Yes Enzo Bi, MD  torsemide (DEMADEX) 20 MG tablet Take 2 tablets (40 mg total) by mouth daily. 08/24/22  Yes Abernathy, Alyssa, NP  traMADol (ULTRAM) 50 MG tablet TAKE 1 TABLET(50 MG) BY MOUTH EVERY 6 HOURS AS NEEDED FOR MODERATE PAIN OR SEVERE PAIN 08/18/22  Yes Abernathy, Alyssa, NP  TRELEGY ELLIPTA 100-62.5-25 MCG/ACT AEPB INHALE 1 PUFF BY MOUTH INTO LUNGS DAILY 05/25/22  Yes Abernathy, Alyssa, NP  metoprolol tartrate (LOPRESSOR) 25 MG tablet TAKE TWO TABLETS BY MOUTH EVERY MORNING AND TAKE TWO TABLETS BY MOUTH EVERY EVENING 03/15/22   Wellington Hampshire, MD  revefenacin (YUPELRI) 175 MCG/3ML nebulizer solution Take 3 mLs (175 mcg total) by nebulization daily. Patient not taking: Reported on 08/27/2022 07/26/22   Jonetta Osgood, NP    Social History   Socioeconomic History   Marital status: Married    Spouse name: Not on file   Number of children: Not  on file   Years of education: Not on file   Highest education level: Not on file  Occupational History   Not on file  Tobacco Use   Smoking status: Former    Years: 25.00    Types: Cigarettes    Quit date: 03/20/1997    Years since quitting: 25.4   Smokeless tobacco: Never  Vaping Use   Vaping Use: Never used  Substance and Sexual Activity   Alcohol use: No   Drug use: No   Sexual activity: Not on file  Other Topics Concern   Not on file  Social History Narrative   Not on file    Social Determinants of Health   Financial Resource Strain: Low Risk  (11/04/2020)   Overall Financial Resource Strain (CARDIA)    Difficulty of Paying Living Expenses: Not hard at all  Food Insecurity: No Food Insecurity (07/14/2022)   Hunger Vital Sign    Worried About Running Out of Food in the Last Year: Never true    Ran Out of Food in the Last Year: Never true  Transportation Needs: No Transportation Needs (07/14/2022)   PRAPARE - Administrator, Civil Service (Medical): No    Lack of Transportation (Non-Medical): No  Physical Activity: Not on file  Stress: Not on file  Social Connections: Not on file  Intimate Partner Violence: Not At Risk (07/14/2022)   Humiliation, Afraid, Rape, and Kick questionnaire    Fear of Current or Ex-Partner: No    Emotionally Abused: No    Physically Abused: No    Sexually Abused: No     Family History  Problem Relation Age of Onset   Coronary artery disease Father    Diabetes Father    Hyperlipidemia Father    Hypertension Father    Stroke Mother    Hyperlipidemia Mother     ROS: Otherwise negative unless mentioned in HPI  Physical Examination  Vitals:   08/29/22 0817 08/29/22 0844  BP:    Pulse:    Resp:    Temp:    SpO2: 92% 94%   Body mass index is 34.16 kg/m.  General:  WDWN in NAD Gait: Not observed HENT: WNL, normocephalic Pulmonary: normal non-labored breathing, without Rales, rhonchi,  wheezing Cardiac: irregular, without  Murmurs, rubs or gallops; without carotid bruits Abdomen: Positive bowel sounds, soft, NT/ND, no masses Skin: with rashes Vascular Exam/Pulses: Bilateral lower extremity edema +2. Warm to touch bilaterally. Right lower extremity with 4cm X 6cm ulcer with purulent drainage. Positive tenderness bilaterally. See media pics.  Extremities: with ischemic changes, without Gangrene , with cellulitis; with open wounds;  Musculoskeletal: no muscle wasting or atrophy  Neurologic: A&O X 3;  No  focal weakness or paresthesias are detected; speech is fluent/normal Psychiatric:  The pt has Normal affect. Lymph:  Unremarkable  CBC    Component Value Date/Time   WBC 18.5 (H) 08/29/2022 0508   RBC 4.21 (L) 08/29/2022 0508   HGB 12.3 (L) 08/29/2022 0508   HGB 15.1 02/19/2020 0000   HCT 40.2 08/29/2022 0508   HCT 45.6 02/19/2020 0000   PLT 406 (H) 08/29/2022 0508   PLT 345 02/19/2020 0000   MCV 95.5 08/29/2022 0508   MCV 90 02/19/2020 0000   MCV 91 07/19/2013 0417   MCH 29.2 08/29/2022 0508   MCHC 30.6 08/29/2022 0508   RDW 14.9 08/29/2022 0508   RDW 12.9 02/19/2020 0000   RDW 14.2 07/19/2013 0417   LYMPHSABS 1.4 08/27/2022 1356  LYMPHSABS 2.2 02/19/2020 0000   LYMPHSABS 2.1 07/19/2013 0417   MONOABS 2.0 (H) 08/27/2022 1356   MONOABS 1.5 (H) 07/19/2013 0417   EOSABS 0.0 08/27/2022 1356   EOSABS 0.2 02/19/2020 0000   EOSABS 0.1 07/19/2013 0417   BASOSABS 0.0 08/27/2022 1356   BASOSABS 0.0 02/19/2020 0000   BASOSABS 0.0 07/19/2013 0417    BMET    Component Value Date/Time   NA 133 (L) 08/29/2022 0508   NA 138 02/19/2020 0000   NA 135 (L) 07/19/2013 0417   K 4.2 08/29/2022 0508   K 3.9 07/19/2013 0417   CL 93 (L) 08/29/2022 0508   CL 101 07/19/2013 0417   CO2 29 08/29/2022 0508   CO2 29 07/19/2013 0417   GLUCOSE 150 (H) 08/29/2022 0508   GLUCOSE 86 07/19/2013 0417   BUN 26 (H) 08/29/2022 0508   BUN 14 02/19/2020 0000   BUN 10 07/19/2013 0417   CREATININE 1.35 (H) 08/29/2022 0508   CREATININE 0.80 07/19/2013 0417   CALCIUM 8.3 (L) 08/29/2022 0508   CALCIUM 8.4 (L) 07/19/2013 0417   GFRNONAA 57 (L) 08/29/2022 0508   GFRNONAA >60 07/19/2013 0417   GFRAA 105 02/19/2020 0000   GFRAA >60 07/19/2013 0417    COAGS: Lab Results  Component Value Date   INR 1.2 08/27/2022     Non-Invasive Vascular Imaging:   EXAM: NONINVASIVE PHYSIOLOGIC VASCULAR STUDY OF BILATERAL LOWER EXTREMITIES  FINDINGS: Right ABI:  1.15   Left ABI:  1.13   Right Lower  Extremity: Monophasic dorsalis pedis and posterior tibial artery waveforms.   Left Lower Extremity: Monophasic posterior tibial and dorsalis pedis artery waveforms.   IMPRESSION: Although ankle-brachial indices are within normal limits, there are monophasic bilateral lower extremity arterial waveforms which suggest that there is underlying arterial occlusive disease. If clinical suspicion remains high for arterial occlusive disease, further evaluation with CT angiography runoff of the lower extremity should be performed.  Statin:  No. Beta Blocker:  Yes.   Aspirin:  Yes.   ACEI:  No. ARB:  No. CCB use:  Yes Other antiplatelets/anticoagulants:  Yes.   ASA 325 MG   ASSESSMENT/PLAN: This is a 67 y.o. male who presents to the ED after a ground-level fall. He has a known medical history significant of CAD s/p PCI, HFrEF, permanent A-fib not on AC, COPD on home 2 L O2, chronic venous stasis. Today has open ulcer to his right lower extremity with multiple non healing scabs to the same leg. He also presents with bilateral lower extremity cellulitis. Currently on antibiotics.   PLAN: Patient to go to the vascular lab for a right lower extremity angiogram with possible angioplasty and possible stent placement. I discussed the procedure, benefits, risks and the complications. Answered all the patients questions. He verbalizes his understanding and would like to proceed. He has eaten breakfast today. We will schedule accordingly.    -BUN = 26 Creat + 1.35 Potassium = 4.2   Sammie Schermerhorn R [facility] 08/29/2022 11:30 AM

## 2022-08-29 NOTE — ED Notes (Signed)
pt taken by transport x 3 on 6L non-rebreather, per day shift RN respiratory states to send up on non-rebreather and take respiratory equipment with pt, attempted to call primary RN on floor to advise with no answer, Secure chat sent to floor RN pt is otw

## 2022-08-29 NOTE — Progress Notes (Signed)
PROGRESS NOTE    Erik [name]  VQQ:[id number] DOB: January 19, 1956 DOA: 08/27/2022 PCP: Jonetta Osgood, NP  [id number]/[id number]  LOS: 2 days   Brief hospital course:   Assessment & Plan: Erik [name] is a 67 y.o. male with medical history significant of CAD s/p PCI, HFrEF, permanent A-fib not on AC, COPD on home 2 L O2, chronic venous stasis who presents to the ED after a ground-level fall.   Night before presentation, pt stated that he felt sudden onset worsening shortness of breath, dizziness and elevated heart rate.  Due to this, he slumped down against the wall and landed on the ground, and stayed on the ground for about 12 hours.   * Atrial fibrillation with RVR ([place]) Patient is presenting after sudden onset shortness of breath and palpitations overnight that led him to become weak and slide down onto the floor.  Rates as high as 152 in the ED.  He received one-time dose of metoprolol with no significant effect.   Pt started on dilt gtt.  Patient is not on AC due to prior refusal; CHA2DS2-VASc elevated at 4.  Pt takes ASA 325 instead. --off dilt gtt today Plan: --cont home lopressor 50 mg BID --cont home ASA 325 daily  CAP (community acquired pneumonia) Patient endorses several days of increased sputum production and slightly increased cough.  CT chest with left lower lobe opacity concerning for pneumonia.  Differential includes aspiration pneumonia as patient's wife states he routinely chokes on his food.   Plan: --cont ceftriaxone and azithromycin  Acute on chronic hypoxemic respiratory failure On 2-3L O2 baseline --O2 requirement up to 6L.  Has severe COPD at baseline with chronic dyspnea.  Now with CAP.  CT also showed signs of pulm HTN --Continue supplemental O2 to keep sats between 88-92%, wean as tolerated --Echo  COPD (chronic obstructive pulmonary disease) (HCC) No wheezing on examination to suggest a COPD exacerbation. Plan: - DuoNebs every 6 hours --cont Breo and  Incruse - Hold off on prednisone for now  Rhabdomyolysis After laying on the ground for 12 hours.  No evidence of renal dysfunction at this time. - S/p 1 L bolus in the ED Plan: --LR@75  for 12 hours  Right sided weakness --MRI brain neg for acute finding Plan: --PT/OT  Bilateral cellulitis of lower leg Patient has a history of bilateral lower extremity cellulitis with admission in December 2023.  Per images taken during that admission, patient's lower extremities, including the wound on the lateral aspect of the right leg, appears significantly worsened.  There is evidence of purulent drainage. --right tib-fib x-ray neg for osteomyelitis --started on vanc and ceftriaxone on admission --wound care consulted Plan: --d/c vanc --cont ceftriaxone --dressing change per order --vascular surgery consult for possible PAD  Chronic systolic CHF (congestive heart failure) (HCC) Bilateral lower extremities with 2+ pitting edema up to the superior aspect of the thighs.  Unfortunately, patient also appears intravascularly dehydrated as well though.  He has received 1 L fluids thus far, will hold off on additional boluses at this time.  Last echocardiogram in June 2023 with EF of 45-50%. Plan: - Hold home torsemide  CAD (coronary artery disease) Troponin mildly elevated 40's, consistent with demand ischemia.  EKG with no changes.  AKI --Cr went from baseline 0.82 on presentation to 1.35 this morning. --LR@75  for 12 hours   DVT prophylaxis: Lovenox SQ Code Status: Full code  Family Communication:  Level of care: Progressive Dispo:   The patient is from: home  Anticipated d/c is to: SNF rehab Anticipated d/c date is: to be determined   Subjective and Interval History:  Pt's mental status was better this morning.  Denied pain in his legs, but had some pain under right frontal rib cage.  Reported dyspnea that's worse than baseline.   Objective: Vitals:   08/29/22 0817 08/29/22 0844  08/29/22 1210 08/29/22 1546  BP:   101/66   Pulse:   99   Resp:   (!) 24   Temp:      TempSrc:      SpO2: 92% 94% 95% 94%  Weight:      Height:        Intake/Output Summary (Last 24 hours) at 08/29/2022 1832 Last data filed at 08/29/2022 1459 Gross per 24 hour  Intake 300 ml  Output --  Net 300 ml   Filed Weights   08/27/22 1401  Weight: 108 kg    Examination:   Constitutional: NAD, AAOx3 HEENT: conjunctivae and lids normal, EOMI CV: No cyanosis.   RESP: normal respiratory effort, on 5L Extremities: improved erythema in BLE SKIN: warm, dry   Data Reviewed: I have personally reviewed labs and imaging studies  Time spent: 50 minutes  Enzo Bi, MD [facility] If 7PM-7AM, please contact night-coverage 08/29/2022, 6:32 PM

## 2022-08-29 NOTE — Evaluation (Signed)
Physical Therapy Evaluation Patient Details Name: Erik [name] MRN: [id number] DOB: 01-Jan-1956 Today's Date: 08/29/2022  History of Present Illness  Erik [name] is a 67 y.o. male with medical history significant of CAD s/p PCI, HFrEF, permanent A-fib not on AC, COPD on home 2 L O2, chronic venous stasis who presents to the ED after a ground-level fall.  Night before presentation, pt stated that he felt sudden onset worsening shortness of breath, dizziness and elevated heart rate.    Patient receiving inpatient treatment for A-fib, CAP, acute on chronic respiratory failure from COPD, rhabdomyolysis, and bilateral LE cellulitis.  Pt's head CT scan negative for acute findings, though patient experiencing R side weakness.  Clinical Impression  Pt is a pleasant 67 year old male who was admitted for Afib with RVR. Pt performs bed mobility with total assist and unable to further perform transfers/ambulation. Pt demonstrates deficits with strength/pain/mobility. Pt with abnormal vitals with limited exertion, limiting further OOB mobility in addition to additional deficits. Currently isn't at baseline level. Would benefit from skilled PT to address above deficits and promote optimal return to PLOF; recommend transition to STR upon discharge from acute hospitalization.      Recommendations for follow up therapy are one component of a multi-disciplinary discharge planning process, led by the attending physician.  Recommendations may be updated based on patient status, additional functional criteria and insurance authorization.  Follow Up Recommendations Skilled nursing-short term rehab (<3 hours/day) Can patient physically be transported by private vehicle: No    Assistance Recommended at Discharge Frequent or constant Supervision/Assistance  Patient can return home with the following  Two people to help with walking and/or transfers;Two people to help with bathing/dressing/bathroom    Equipment  Recommendations  (TBD)  Recommendations for Other Services       Functional Status Assessment Patient has had a recent decline in their functional status and demonstrates the ability to make significant improvements in function in a reasonable and predictable amount of time.     Precautions / Restrictions Precautions Precautions: Fall Restrictions Weight Bearing Restrictions: No      Mobility  Bed Mobility Overal bed mobility: Needs Assistance Bed Mobility: Rolling, Supine to Sit Rolling: Max assist   Supine to sit: Total assist     General bed mobility comments: needs max assist for rolling. Able to initiate with B UE, however total assist with B LE. Pt with B LE redness and pain. Attempted supine->sit, however unable to fully complete transfer secondary to pain.    Transfers                   General transfer comment: unable to perform    Ambulation/Gait                  Stairs            Wheelchair Mobility    Modified Rankin (Stroke Patients Only)       Balance Overall balance assessment: Needs assistance Sitting-balance support: Single extremity supported Sitting balance-Leahy Scale: Poor                                       Pertinent Vitals/Pain Pain Assessment Pain Assessment: Faces Faces Pain Scale: Hurts whole lot Pain Location: "everywhere" Pain Descriptors / Indicators: Moaning, Grimacing, Guarding Pain Intervention(s): Limited activity within patient's tolerance, Repositioned    Home Living Family/patient expects to be  discharged to:: Private residence Living Arrangements: Spouse/significant other Available Help at Discharge: Family;Available 24 hours/day Type of Home: House Home Access: Stairs to enter Entrance Stairs-Rails: Can reach both;Right;Left Entrance Stairs-Number of Steps: 9   Home Layout: One level Home Equipment: Conservation officer, nature (2 wheels);Cane - quad Additional Comments: home oxygen, 2-3  L    Prior Function Prior Level of Function : Needs assist             Mobility Comments: Reliant on RW or cane in household, reports he rarely leaves his house. Reports no recent falls. ADLs Comments: IND in toileting, takes sponge baths.  Patient's wife assists with lower body dressing. Family handles IADLs, he reports having driven 3-4 times in the last year     Hand Dominance        Extremity/Trunk Assessment   Upper Extremity Assessment Upper Extremity Assessment: Generalized weakness (B UE grossly 3/5)    Lower Extremity Assessment Lower Extremity Assessment: Generalized weakness (B LE grossly 2/5 and pain limited)       Communication   Communication: No difficulties  Cognition Arousal/Alertness: Awake/alert Behavior During Therapy: WFL for tasks assessed/performed Overall Cognitive Status: Within Functional Limits for tasks assessed                                 General Comments: pleasant, however generally grumpy and uncomfortable        General Comments General comments (skin integrity, edema, etc.): HR ranging from 96bpm to 117bpm with limited exertion. RR increases to 36 RR with minimal exertion with SOB symptoms noted. O2 at 91%-95% on 5L of O2.    Exercises     Assessment/Plan    PT Assessment Patient needs continued PT services  PT Problem List Decreased strength;Decreased balance;Decreased mobility;Cardiopulmonary status limiting activity;Pain;Decreased activity tolerance       PT Treatment Interventions Gait training;DME instruction;Therapeutic exercise;Balance training    PT Goals (Current goals can be found in the Care Plan section)  Acute Rehab PT Goals Patient Stated Goal: to get stronger PT Goal Formulation: With patient Time For Goal Achievement: 09/12/22 Potential to Achieve Goals: Good    Frequency Min 2X/week     Co-evaluation               AM-PAC PT "6 Clicks" Mobility  Outcome Measure Help needed  turning from your back to your side while in a flat bed without using bedrails?: Total Help needed moving from lying on your back to sitting on the side of a flat bed without using bedrails?: Total Help needed moving to and from a bed to a chair (including a wheelchair)?: Total Help needed standing up from a chair using your arms (e.g., wheelchair or bedside chair)?: Total Help needed to walk in hospital room?: Total Help needed climbing 3-5 steps with a railing? : Total 6 Click Score: 6    End of Session Equipment Utilized During Treatment: Oxygen Activity Tolerance: Patient tolerated treatment well Patient left: in bed;with bed alarm set Nurse Communication: Mobility status PT Visit Diagnosis: Muscle weakness (generalized) (M62.81);Pain Pain - Right/Left:  (bilat) Pain - part of body: Leg    Time: 9528-4132 PT Time Calculation (min) (ACUTE ONLY): 11 min   Charges:   PT Evaluation $PT Eval Moderate Complexity: 1 [address] PT, DPT, GCS [phone]   Trevontae Lindahl 08/29/2022, 2:53 PM

## 2022-08-29 NOTE — Progress Notes (Signed)
RT called to access pt for increased WOB. Pt placed on HHFNC 45L 35%. Pt's WOB visibly decreased quickly. RN aware, Oxygen order changed

## 2022-08-29 NOTE — Evaluation (Signed)
Occupational Therapy Evaluation Patient Details Name: Erik [name] MRN: [id number] DOB: 1956/04/25 Today's Date: 08/29/2022   History of Present Illness Antwann L Choo is a 67 y.o. male with medical history significant of CAD s/p PCI, HFrEF, permanent A-fib not on AC, COPD on home 2 L O2, chronic venous stasis who presents to the ED after a ground-level fall.  Night before presentation, pt stated that he felt sudden onset worsening shortness of breath, dizziness and elevated heart rate.    Patient receiving inpatient treatment for A-fib, CAP, acute on chronic respiratory failure from COPD, rhabdomyolysis, and bilateral LE cellulitis.  Pt's head CT scan negative for acute findings, though patient experiencing R side weakness.   Clinical Impression   Mr. Begley presents to OT with generalized weakness, low endurance, balance deficits, and decreased ROM that impacts his engagement in self-care tasks.  Prior to admission, patient was able to perform mobility with mod I using RW or cane.  He receives assistance from his wife for lower body dressing and IADLs.  He sponge bathes at baseline.  Currently, patient requires max assist for bed mobility due to generalized weakness and low endurance.  Patient unable to fully complete supine > sit transfer with max assist from OT due to pain and increased HR with activity.  He is able to complete one-handed ADLs with setup assist in supported sitting.  He requires min assist for bimanual ADL tasks in supported sitting due to need to support self with UE (R side lateral lean).  Anticipate max-total assist required for all lower body ADLs.  Mr. Yeo will likely continue to benefit from skilled OT services in acute setting to support functional strengthening, endurance, balance, safety and independence in ADLs.  Recommend STR upon discharge to further address these goals.      Recommendations for follow up therapy are one component of a multi-disciplinary discharge  planning process, led by the attending physician.  Recommendations may be updated based on patient status, additional functional criteria and insurance authorization.   Follow Up Recommendations  Skilled nursing-short term rehab (<3 hours/day)     Assistance Recommended at Discharge Frequent or constant Supervision/Assistance  Patient can return home with the following Two people to help with walking and/or transfers;A lot of help with bathing/dressing/bathroom;Assistance with cooking/housework;Assist for transportation;Help with stairs or ramp for entrance    Functional Status Assessment  Patient has had a recent decline in their functional status and demonstrates the ability to make significant improvements in function in a reasonable and predictable amount of time.  Equipment Recommendations  Tub/shower bench    Recommendations for Other Services       Precautions / Restrictions Precautions Precautions: Fall Restrictions Weight Bearing Restrictions: No      Mobility Bed Mobility Overal bed mobility: Needs Assistance Bed Mobility: Rolling Rolling: Max assist           Patient Response: Anxious, Cooperative  Transfers Overall transfer level: Needs assistance                 General transfer comment: not tested due to increased heart rate, heavy level of assist      Balance Overall balance assessment: Needs assistance Sitting-balance support: Single extremity supported Sitting balance-Leahy Scale: Poor Sitting balance - Comments: Requires single extremity support to maintain trunk control in supported sitting Postural control: Right lateral lean  ADL either performed or assessed with clinical judgement   ADL Overall ADL's : Needs assistance/impaired Eating/Feeding: Set up;Bed level   Grooming: Minimal assistance;Sitting Grooming Details (indicate cue type and reason): unable to perform bimanual tasks due to  poor trunk control, even in supported sitting                               General ADL Comments: Able to perform one-handed ADLs with setup assist in supported sitting, requires min assist for bimanual tasks as patient unable to maintain trunk control in supported sitting.  Increased level of assist for all other ADLs, anticipate max-total A.     Vision Baseline Vision/History: 1 Wears glasses Ability to See in Adequate Light: 1 Impaired Patient Visual Report: No change from baseline Vision Assessment?: No apparent visual deficits Additional Comments: reading glasses only     Perception     Praxis      Pertinent Vitals/Pain Pain Assessment Pain Assessment: Faces Faces Pain Scale: Hurts little more Pain Location: back Pain Descriptors / Indicators: Moaning, Grimacing, Guarding Pain Intervention(s): Limited activity within patient's tolerance, Monitored during session, Repositioned     Hand Dominance Right   Extremity/Trunk Assessment Upper Extremity Assessment Upper Extremity Assessment: Generalized weakness   Lower Extremity Assessment Lower Extremity Assessment: Defer to PT evaluation       Communication Communication Communication: No difficulties   Cognition Arousal/Alertness: Awake/alert Behavior During Therapy: WFL for tasks assessed/performed Overall Cognitive Status: Within Functional Limits for tasks assessed                                       General Comments  HR 106, SpO2 93% at rest on 6L O2, HR increased to 130s with movement    Exercises Other Exercises Other Exercises: provided education re: OT role and plan of care, fall and safety precautions, bed mobility, importance of OOB mobility, discharge recommendations   Shoulder Instructions      Home Living Family/patient expects to be discharged to:: Private residence Living Arrangements: Spouse/significant other Available Help at Discharge: Family;Available 24  hours/day Type of Home: House Home Access: Stairs to enter [employer] of Steps: 9 Entrance Stairs-Rails: Can reach both;Right;Left Home Layout: One level     Bathroom Shower/Tub: Teacher, early years/pre: Handicapped height     Home Equipment: Conservation officer, nature (2 wheels);Cane - quad   Additional Comments: home oxygen, 2-3 L      Prior Functioning/Environment Prior Level of Function : Needs assist       Physical Assist : ADLs (physical)   ADLs (physical): Dressing;Bathing;IADLs Mobility Comments: Reliant on RW or cane in household, reports he rarely leaves his house ADLs Comments: IND in toileting, takes sponge baths.  Patient's wife assists with lower body dressing. Family handles IADLs, he reports having driven 3-4 times in the last year        OT Problem List: Decreased strength;Decreased range of motion;Decreased activity tolerance;Impaired balance (sitting and/or standing);Decreased knowledge of use of DME or AE;Cardiopulmonary status limiting activity;Pain      OT Treatment/Interventions: Self-care/ADL training;Therapeutic exercise;Energy conservation;DME and/or AE instruction;Therapeutic activities;Patient/family education;Balance training    OT Goals(Current goals can be found in the care plan section) Acute Rehab OT Goals Patient Stated Goal: to return home OT Goal Formulation: With patient Time For Goal Achievement: 09/12/22 Potential to Achieve Goals:  Fair  OT Frequency: Min 2X/week    Co-evaluation              AM-PAC OT "6 Clicks" Daily Activity     Outcome Measure Help from another person eating meals?: None Help from another person taking care of personal grooming?: A Little Help from another person toileting, which includes using toliet, bedpan, or urinal?: A Lot Help from another person bathing (including washing, rinsing, drying)?: A Lot Help from another person to put on and taking off regular upper body clothing?: A  Lot Help from another person to put on and taking off regular lower body clothing?: A Lot 6 Click Score: 15   End of Session Equipment Utilized During Treatment: Oxygen  Activity Tolerance: Patient limited by fatigue Patient left: in bed;with call bell/phone within reach;with bed alarm set  OT Visit Diagnosis: Other abnormalities of gait and mobility (R26.89);Muscle weakness (generalized) (M62.81)                Time: 5093-2671 OT Time Calculation (min): 19 min Charges:  OT General Charges $OT Visit: 1 Visit OT Evaluation $OT Eval Moderate Complexity: 1 Mod  Trenese Haft, OTR/L 08/29/22, 10:23 AM

## 2022-08-29 NOTE — ED Notes (Signed)
Provider Foust NP called back and sts since pt is asymptomatic to just leave the cardizem off at this time.

## 2022-08-30 ENCOUNTER — Inpatient Hospital Stay (HOSPITAL_COMMUNITY)
Admit: 2022-08-30 | Discharge: 2022-08-30 | Disposition: A | Discharge status: Home / Self Care | Payer: PPO | Attending: Hospitalist | Admitting: Hospitalist

## 2022-08-30 DIAGNOSIS — I4891 Unspecified atrial fibrillation: Secondary | ICD-10-CM | POA: Diagnosis not present

## 2022-08-30 DIAGNOSIS — R0602 Shortness of breath: Secondary | ICD-10-CM | POA: Diagnosis not present

## 2022-08-30 DIAGNOSIS — I2699 Other pulmonary embolism without acute cor pulmonale: Secondary | ICD-10-CM | POA: Diagnosis not present

## 2022-08-30 LAB — BASIC METABOLIC PANEL
Anion gap: 7 (ref 5–15)
BUN: 23 mg/dL (ref 8–23)
CO2: 32 mmol/L (ref 22–32)
Calcium: 8.5 mg/dL — ABNORMAL LOW (ref 8.9–10.3)
Chloride: 97 mmol/L — ABNORMAL LOW (ref 98–111)
Creatinine, Ser: 0.84 mg/dL (ref 0.61–1.24)
GFR, Estimated: >60 mL/min (ref >60)
Glucose, Bld: 124 mg/dL — ABNORMAL HIGH (ref 70–99)
Potassium: 3.5 mmol/L (ref 3.5–5.1)
Sodium: 136 mmol/L (ref 135–145)

## 2022-08-30 LAB — BLOOD GAS, VENOUS
Acid-Base Excess: 11.8 mmol/L — ABNORMAL HIGH (ref 0.0–2.0)
Bicarbonate: 38.5 mmol/L — ABNORMAL HIGH (ref 20.0–28.0)
O2 Saturation: 33.6 %
Patient temperature: 37
pCO2, Ven: 58 mmHg (ref 44–60)
pH, Ven: 7.43 (ref 7.25–7.43)

## 2022-08-30 LAB — ECHOCARDIOGRAM COMPLETE
AR max vel: 1.18 cm2
AV Area VTI: 1.23 cm2
AV Area mean vel: 1.12 cm2
AV Mean grad: 4.6 mmHg
AV Peak grad: 8.1 mmHg
Ao pk vel: 1.43 m/s
Area-P 1/2: 4.09 cm2
Calc EF: 42 %
Height: 70 in
MV M vel: 4.06 m/s
MV Peak grad: 65.9 mmHg
P 1/2 time: 498 msec
S' Lateral: 3.5 cm
Single Plane A2C EF: 44.2 %
Single Plane A4C EF: 35.4 %
Weight: 3809.55 oz

## 2022-08-30 LAB — CBC
HCT: 39.6 % (ref 39.0–52.0)
Hemoglobin: 12.6 g/dL — ABNORMAL LOW (ref 13.0–17.0)
MCH: 29.6 pg (ref 26.0–34.0)
MCHC: 31.8 g/dL (ref 30.0–36.0)
MCV: 93 fL (ref 80.0–100.0)
Platelets: 424 10*3/uL — ABNORMAL HIGH (ref 150–400)
RBC: 4.26 MIL/uL (ref 4.22–5.81)
RDW: 14.7 % (ref 11.5–15.5)
WBC: 14.1 10*3/uL — ABNORMAL HIGH (ref 4.0–10.5)
nRBC: 0 % (ref 0.0–0.2)

## 2022-08-30 LAB — LEGIONELLA PNEUMOPHILA SEROGP 1 UR AG: L. pneumophila Serogp 1 Ur Ag: NEGATIVE

## 2022-08-30 LAB — MAGNESIUM: Magnesium: 2 mg/dL (ref 1.7–2.4)

## 2022-08-30 MED ORDER — GUAIFENESIN-DM 100-10 MG/5ML PO SYRP
5.0000 mL | ORAL_SOLUTION | ORAL | Status: DC | PRN
  Administered 2022-08-30 – 2022-09-05 (×2): 5 mL via ORAL
  Filled 2022-08-30 (×3): qty 10

## 2022-08-30 MED ORDER — METOPROLOL TARTRATE 50 MG PO TABS
100.0000 mg | ORAL_TABLET | Freq: Two times a day (BID) | ORAL | Status: DC
  Administered 2022-08-30 – 2022-09-08 (×19): 100 mg via ORAL
  Filled 2022-08-30 (×19): qty 2

## 2022-08-30 MED ORDER — IPRATROPIUM-ALBUTEROL 0.5-2.5 (3) MG/3ML IN SOLN
3.0000 mL | Freq: Three times a day (TID) | RESPIRATORY_TRACT | Status: DC
  Administered 2022-08-31 – 2022-09-08 (×23): 3 mL via RESPIRATORY_TRACT
  Filled 2022-08-30 (×24): qty 3

## 2022-08-30 MED ORDER — AZITHROMYCIN 250 MG PO TABS
500.0000 mg | ORAL_TABLET | Freq: Every day | ORAL | Status: AC
  Administered 2022-08-30 – 2022-08-31 (×2): 500 mg via ORAL
  Filled 2022-08-30 (×2): qty 2

## 2022-08-30 NOTE — Progress Notes (Signed)
PROGRESS NOTE    Erik [name]  QMG:[id number] DOB: 1956/03/18 DOA: 08/27/2022 PCP: Jonetta Osgood, NP  260A/260A-AA  LOS: 3 days   Brief hospital course:   Assessment & Plan: Erik [name] is a 67 y.o. male with medical history significant of CAD s/p PCI, HFrEF, permanent A-fib not on AC, COPD on home 2 L O2, chronic venous stasis who presented to the ED after a ground-level fall.   Night before presentation, pt stated that he felt sudden onset worsening shortness of breath, dizziness and elevated heart rate.  Due to this, he slumped down against the wall and landed on the ground, and stayed on the ground for about 12 hours.   * Atrial fibrillation with RVR ([place]) Patient is presenting after sudden onset shortness of breath and palpitations overnight that led him to become weak and slide down onto the floor.  Rates as high as 152 in the ED.  He received one-time dose of metoprolol with no significant effect.   Pt started on dilt gtt.  Patient is not on AC due to prior refusal; CHA2DS2-VASc elevated at 4.  Pt takes ASA 325 instead. --off dilt gtt on 1/22 Plan: --increase lopressor to 100 mg BID --cont home ASA 325 daily  CAP (community acquired pneumonia) Patient endorses several days of increased sputum production and slightly increased cough.  CT chest with left lower lobe opacity concerning for pneumonia.  Differential includes aspiration pneumonia as patient's wife states he routinely chokes on his food.   Plan: --cont ceftriaxone and azithromycin  Acute on chronic hypoxemic respiratory failure On 2-3L O2 baseline --O2 requirement up to 6L.  Has severe COPD at baseline with chronic dyspnea.  Now with CAP.  CT also showed signs of pulm HTN --Continue supplemental O2 to keep sats between 88-92%, wean as tolerated --Echo to assess for pulm HTN  COPD (chronic obstructive pulmonary disease) (HCC) No wheezing on examination to suggest a COPD exacerbation. Plan: - DuoNebs every  6 hours --cont Breo and Incruse - Hold off on prednisone for now  Rhabdomyolysis After laying on the ground for 12 hours.  No evidence of renal dysfunction at this time. - S/p IVF  Right sided weakness --MRI brain neg for acute finding Plan: --PT/OT  Bilateral cellulitis of lower leg Patient has a history of bilateral lower extremity cellulitis with admission in December 2023.  Per images taken during that admission, patient's lower extremities, including the wound on the lateral aspect of the right leg, appears significantly worsened.  There is drainage. --right tib-fib x-ray neg for osteomyelitis --started on vanc and ceftriaxone on admission, vanc d/c'ed. --wound care consulted Plan: --cont ceftriaxone --dressing change per order --vascular surgery to plan for angiogram  Possible PAD --vascular surgery to plan for angiogram  Chronic systolic CHF (congestive heart failure) (HCC) Bilateral lower extremities with 2+ pitting edema up to the superior aspect of the thighs.  Unfortunately, patient also appears intravascularly dehydrated as well though.  He has received 1 L fluids thus far, will hold off on additional boluses at this time.  Last echocardiogram in June 2023 with EF of 45-50%. Plan: - Hold home torsemide  CAD (coronary artery disease) Troponin mildly elevated 40's, consistent with demand ischemia.  EKG with no changes.  AKI --Cr went from baseline 0.82 on presentation to 1.35 morning of 1/22. --Cr back to baseline after gentle MIVF   DVT prophylaxis: Lovenox SQ Code Status: Full code  Family Communication:  Level of care: Progressive Dispo:  The patient is from: home Anticipated d/c is to: SNF rehab Anticipated d/c date is: to be determined.  On IV abx, pending angiogram.   Subjective and Interval History:  Pt had intermittent increased WOB.  Appeared comfortable at rest on 5L this morning.     Objective: Vitals:   08/30/22 0508 08/30/22 0753 08/30/22  0804 08/30/22 1207  BP: (!) 104/91  131/86 116/76  Pulse: (!) 106  (!) 109 97  Resp: 17  19 20   Temp: 97.8 F (36.6 C)  98 F (36.7 C) 97.7 F (36.5 C)  TempSrc: Oral  Oral Oral  SpO2: 95% 96% 95% 99%  Weight:      Height:        Intake/Output Summary (Last 24 hours) at 08/30/2022 1618 Last data filed at 08/30/2022 1500 Gross per 24 hour  Intake 999.39 ml  Output 775 ml  Net 224.39 ml   Filed Weights   08/27/22 1401  Weight: 108 kg    Examination:   Constitutional: NAD, AAOx3 HEENT: conjunctivae and lids normal, EOMI CV: No cyanosis.   RESP: normal respiratory effort, on 5L Extremities: erythema improved in BLE.   Neuro: II - XII grossly intact.   Psych: Normal mood and affect.  Appropriate judgement and reason   Data Reviewed: I have personally reviewed labs and imaging studies  Time spent: 35 minutes  Enzo Bi, MD [facility] If 7PM-7AM, please contact night-coverage 08/30/2022, 4:18 PM

## 2022-08-30 NOTE — TOC Initial Note (Signed)
Transition of Care [facility]) - Initial/Assessment Note    Patient Details  Name: Erik [name] MRN: [id number] Date of Birth: [birth year]  Transition of [facility]) CM/SW Contact:    Erik Bash, LCSW Phone Number: 08/30/2022, 9:19 AM  Clinical Narrative:                  CSW spoke with patient's spouse Erik [name] to inform of PT recommendations for SNF.   Patient is currently on HFNC 45L O2. Spouse Erik [name] reports she would like to speak with patient regarding recommendations once he's more medically stable and weaned off o2, she reports hopeful of patient improving to discharge home with home health services.   Patient was previously active with [facility] for RN for wound care, spouse Erik [name] aware PT can be added if needed closer to discharge time frame.   Pending medical improvement to determine appropriate dispo.   Expected Discharge Plan:  (TBD) Barriers to Discharge: Continued Medical Work up   Patient Goals and CMS Choice Patient states their goals for this hospitalization and ongoing recovery are:: to go home CMS Medicare.gov Compare Post Acute Care list provided to:: Patient Choice offered to / list presented to : Patient [place] ownership interest in [facility].provided to:: Patient    Expected Discharge Plan and Services       Living arrangements for the past 2 months: Single Family Home                                      Prior Living Arrangements/Services Living arrangements for the past 2 months: Single Family Home Lives with:: Spouse                   Activities of Daily Living      Permission Sought/Granted                  Emotional Assessment       Orientation: : Oriented to Self, Oriented to Place, Oriented to  Time, Oriented to Situation Alcohol / Substance Use: Not Applicable Psych Involvement: No (comment)  Admission diagnosis:  Cellulitis of right lower extremity [L03.115] Atrial fibrillation  with RVR ([place]) [I48.91] Fall, initial encounter [W19.XXXA] Sepsis without acute organ dysfunction, due to unspecified organism [facility]) [A41.9] Patient Active Problem List   Diagnosis Date Noted   Atrial fibrillation with RVR ([place]) 08/27/2022   CAP (community acquired pneumonia) 08/27/2022   Rhabdomyolysis 08/27/2022   Right sided weakness 08/27/2022   Lower extremity cellulitis 07/14/2022   Leg swelling 07/13/2022   Left hip pain 01/17/2022   Ulcer of extremity due to chronic venous insufficiency ([place]) 01/14/2022   Bilateral cellulitis of lower leg 01/13/2022   Ambulatory dysfunction 01/13/2022   Bilateral lower leg cellulitis 23/76/2831   Chronic systolic CHF (congestive heart failure) ([place]) 01/13/2022   Chronic respiratory failure with hypoxia ([place]) 01/13/2022   Atherosclerosis of native arteries of extremity with intermittent claudication ([place]) 06/01/2020   Dorsalgia 09/11/2017   Occlusion and stenosis of bilateral carotid arteries 09/11/2017   Pulmonic heart disease ([place]) 09/11/2017   COPD (chronic obstructive pulmonary disease) ([place]) 08/14/2017   Chronic a-fib ([place]) 03/27/2015   CAD (coronary artery disease) 03/02/2015   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 [place] [address] Phone: [phone] Fax: [phone]  [place], [place] [place] [address] Phone: [phone] Fax: [phone]     Social Determinants of Health (SDOH) Social History: SDOH Screenings   Food Insecurity: No  Food Insecurity (07/14/2022)  Housing: Low Risk  (07/14/2022)  Transportation Needs: No Transportation Needs (07/14/2022)  Utilities: Not At Risk (07/14/2022)  Alcohol Screen: Low Risk  (02/19/2020)  Depression (PHQ2-9): Low Risk  (08/25/2020)  Financial Resource Strain: Low Risk  (11/04/2020)  Tobacco Use: Medium Risk (08/27/2022)   SDOH Interventions:     Readmission Risk Interventions     No data to display

## 2022-08-30 NOTE — Care Management Important Message (Signed)
Important Message  Patient Details  Name: Erik [name] MRN: [id number] Date of Birth: [birth year]   Medicare Important Message Given:  Yes     [name] 08/30/2022, 9:46 AM

## 2022-08-31 ENCOUNTER — Encounter
Admission: EM | Disposition: A | Discharge status: Skilled Nursing Facility | Payer: Self-pay | Source: Home / Self Care | Attending: Internal Medicine

## 2022-08-31 DIAGNOSIS — L03115 Cellulitis of right lower limb: Secondary | ICD-10-CM | POA: Diagnosis not present

## 2022-08-31 DIAGNOSIS — I701 Atherosclerosis of renal artery: Secondary | ICD-10-CM | POA: Diagnosis not present

## 2022-08-31 DIAGNOSIS — I700 Atherosclerosis of aorta: Secondary | ICD-10-CM | POA: Diagnosis not present

## 2022-08-31 DIAGNOSIS — I70261 Atherosclerosis of native arteries of extremities with gangrene, right leg: Secondary | ICD-10-CM | POA: Diagnosis not present

## 2022-08-31 HISTORY — PX: LOWER EXTREMITY ANGIOGRAPHY: CATH118251

## 2022-08-31 LAB — CBC
HCT: 40.7 % (ref 39.0–52.0)
Hemoglobin: 12.6 g/dL — ABNORMAL LOW (ref 13.0–17.0)
MCH: 29.4 pg (ref 26.0–34.0)
MCHC: 31 g/dL (ref 30.0–36.0)
MCV: 94.9 fL (ref 80.0–100.0)
Platelets: 430 10*3/uL — ABNORMAL HIGH (ref 150–400)
RBC: 4.29 MIL/uL (ref 4.22–5.81)
RDW: 14.7 % (ref 11.5–15.5)
WBC: 13.8 10*3/uL — ABNORMAL HIGH (ref 4.0–10.5)
nRBC: 0 % (ref 0.0–0.2)

## 2022-08-31 LAB — MAGNESIUM: Magnesium: 2.2 mg/dL (ref 1.7–2.4)

## 2022-08-31 LAB — BASIC METABOLIC PANEL
Anion gap: 7 (ref 5–15)
BUN: 19 mg/dL (ref 8–23)
CO2: 32 mmol/L (ref 22–32)
Calcium: 8.3 mg/dL — ABNORMAL LOW (ref 8.9–10.3)
Chloride: 99 mmol/L (ref 98–111)
Creatinine, Ser: 0.78 mg/dL (ref 0.61–1.24)
GFR, Estimated: >60 mL/min (ref >60)
Glucose, Bld: 121 mg/dL — ABNORMAL HIGH (ref 70–99)
Potassium: 3.8 mmol/L (ref 3.5–5.1)
Sodium: 138 mmol/L (ref 135–145)

## 2022-08-31 SURGERY — LOWER EXTREMITY ANGIOGRAPHY
Anesthesia: Moderate Sedation | Laterality: Right

## 2022-08-31 MED ORDER — ONDANSETRON HCL 4 MG/2ML IJ SOLN: 4.0000 mg | Freq: Four times a day (QID) | INTRAMUSCULAR | Status: DC | PRN

## 2022-08-31 MED ORDER — CLOPIDOGREL BISULFATE 75 MG PO TABS
300.0000 mg | ORAL_TABLET | ORAL | Status: AC
  Administered 2022-08-31: 300 mg via ORAL
  Filled 2022-08-31: qty 4

## 2022-08-31 MED ORDER — FENTANYL CITRATE (PF) 100 MCG/2ML IJ SOLN
INTRAMUSCULAR | Status: DC | PRN
  Administered 2022-08-31: 12.5 ug via INTRAVENOUS

## 2022-08-31 MED ORDER — HYDROMORPHONE HCL 1 MG/ML IJ SOLN
1.0000 mg | Freq: Once | INTRAMUSCULAR | Status: AC | PRN
  Administered 2022-09-01: 1 mg via INTRAVENOUS
  Filled 2022-08-31: qty 1

## 2022-08-31 MED ORDER — FENTANYL CITRATE PF 50 MCG/ML IJ SOSY
PREFILLED_SYRINGE | INTRAMUSCULAR | Status: AC
  Filled 2022-08-31: qty 1

## 2022-08-31 MED ORDER — MIDAZOLAM HCL 2 MG/2ML IJ SOLN
INTRAMUSCULAR | Status: AC
  Filled 2022-08-31: qty 2

## 2022-08-31 MED ORDER — CLOPIDOGREL BISULFATE 75 MG PO TABS
75.0000 mg | ORAL_TABLET | Freq: Every day | ORAL | Status: DC
  Administered 2022-09-01 – 2022-09-08 (×8): 75 mg via ORAL
  Filled 2022-08-31 (×8): qty 1

## 2022-08-31 MED ORDER — SODIUM CHLORIDE 0.9 % IV SOLN: INTRAVENOUS | Status: DC

## 2022-08-31 MED ORDER — FENTANYL CITRATE PF 50 MCG/ML IJ SOSY
12.5000 ug | PREFILLED_SYRINGE | Freq: Once | INTRAMUSCULAR | Status: DC | PRN
  Filled 2022-08-31: qty 1

## 2022-08-31 MED ORDER — ASPIRIN 81 MG PO TBEC
81.0000 mg | DELAYED_RELEASE_TABLET | Freq: Every day | ORAL | Status: DC
  Administered 2022-09-01 – 2022-09-08 (×8): 81 mg via ORAL
  Filled 2022-08-31 (×8): qty 1

## 2022-08-31 MED ORDER — CHLORHEXIDINE GLUCONATE 4 % EX LIQD
60.0000 mL | Freq: Once | CUTANEOUS | Status: AC
  Administered 2022-08-31: 4 via TOPICAL

## 2022-08-31 MED ORDER — HEPARIN SODIUM (PORCINE) 1000 UNIT/ML IJ SOLN
INTRAMUSCULAR | Status: DC | PRN
  Administered 2022-08-31: 6000 [IU] via INTRAVENOUS

## 2022-08-31 MED ORDER — CHLORHEXIDINE GLUCONATE 4 % EX LIQD: 60.0000 mL | Freq: Once | CUTANEOUS | Status: DC

## 2022-08-31 MED ORDER — DIPHENHYDRAMINE HCL 50 MG/ML IJ SOLN: 50.0000 mg | Freq: Once | INTRAMUSCULAR | Status: DC | PRN

## 2022-08-31 MED ORDER — ASPIRIN 325 MG PO TABS
325.0000 mg | ORAL_TABLET | ORAL | Status: AC
  Filled 2022-08-31 (×2): qty 1

## 2022-08-31 MED ORDER — CEFAZOLIN SODIUM-DEXTROSE 2-4 GM/100ML-% IV SOLN
2.0000 g | INTRAVENOUS | Status: AC
  Filled 2022-08-31: qty 100

## 2022-08-31 MED ORDER — CEFAZOLIN SODIUM-DEXTROSE 2-4 GM/100ML-% IV SOLN
INTRAVENOUS | Status: AC
  Administered 2022-08-31: 2 g via INTRAVENOUS
  Filled 2022-08-31: qty 100

## 2022-08-31 MED ORDER — METHYLPREDNISOLONE SODIUM SUCC 125 MG IJ SOLR: 125.0000 mg | Freq: Once | INTRAMUSCULAR | Status: DC | PRN

## 2022-08-31 MED ORDER — MIDAZOLAM HCL 2 MG/ML PO SYRP: 8.0000 mg | ORAL_SOLUTION | Freq: Once | ORAL | Status: DC | PRN

## 2022-08-31 MED ORDER — HEPARIN SODIUM (PORCINE) 1000 UNIT/ML IJ SOLN
INTRAMUSCULAR | Status: AC
  Filled 2022-08-31: qty 10

## 2022-08-31 MED ORDER — FAMOTIDINE 20 MG PO TABS: 40.0000 mg | ORAL_TABLET | Freq: Once | ORAL | Status: DC | PRN

## 2022-08-31 MED ORDER — IODIXANOL 320 MG/ML IV SOLN
INTRAVENOUS | Status: DC | PRN
  Administered 2022-08-31: 65 mL via INTRA_ARTERIAL

## 2022-08-31 MED ORDER — MIDAZOLAM HCL 2 MG/2ML IJ SOLN
INTRAMUSCULAR | Status: DC | PRN
  Administered 2022-08-31 (×2): 1 mg via INTRAVENOUS

## 2022-08-31 SURGICAL SUPPLY — 24 items
BALLN LUTONIX DCB 6X60X130 (BALLOONS) ×1
BALLN LUTONIX DCB 6X80X130 (BALLOONS) ×1
BALLN LUTONIX DCB 7X40X130 (BALLOONS) ×1
BALLOON LUTONIX DCB 6X60X130 (BALLOONS) IMPLANT
BALLOON LUTONIX DCB 6X80X130 (BALLOONS) IMPLANT
BALLOON LUTONIX DCB 7X40X130 (BALLOONS) IMPLANT
CATH ANGIO 5F PIGTAIL 65CM (CATHETERS) IMPLANT
CATH TEMPO 5F RIM 65CM (CATHETERS) IMPLANT
DEVICE STARCLOSE SE CLOSURE (Vascular Products) IMPLANT
GLIDEWIRE ADV .035X260CM (WIRE) IMPLANT
GOWN STRL REUS W/ TWL LRG LVL3 (GOWN DISPOSABLE) ×1 IMPLANT
GOWN STRL REUS W/TWL LRG LVL3 (GOWN DISPOSABLE) ×1
KIT ENCORE 26 ADVANTAGE (KITS) IMPLANT
NDL ENTRY 21GA 7CM ECHOTIP (NEEDLE) IMPLANT
NEEDLE ENTRY 21GA 7CM ECHOTIP (NEEDLE) ×1 IMPLANT
PACK ANGIOGRAPHY (CUSTOM PROCEDURE TRAY) ×1 IMPLANT
SET INTRO CAPELLA COAXIAL (SET/KITS/TRAYS/PACK) IMPLANT
SHEATH BRITE TIP 5FRX11 (SHEATH) IMPLANT
SHEATH FLEX ANSEL ANG 6F 45CM (SHEATH) IMPLANT
STENT LIFESTAR 9X40 (Permanent Stent) IMPLANT
STENT LIFESTENT 5F 7X80X135 (Permanent Stent) IMPLANT
SYR MEDRAD MARK 7 150ML (SYRINGE) IMPLANT
TUBING CONTRAST HIGH PRESS 72 (TUBING) IMPLANT
WIRE GUIDERIGHT .035X150 (WIRE) IMPLANT

## 2022-08-31 NOTE — Progress Notes (Signed)
PT Cancellation Note  Patient Details Name: Erik [name] MRN: [id number] DOB: 1956/07/23   Cancelled Treatment:    Reason Eval/Treat Not Completed: Other (comment). Pt currently out of room for vascular procedure. Will re-attempt next date if appropriate.   Chemere Steffler 08/31/2022, 11:13 AM Greggory Stallion, PT, DPT, GCS [phone]

## 2022-08-31 NOTE — Op Note (Signed)
[facility]  Percutaneous Study/Intervention Procedural Note   Date of Surgery: 08/31/2022  Surgeon:  Katha Cabal, MD.  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   Introduction catheter into right lower extremity 3rd order catheter placement              2.   Contrast injection right lower extremity for distal runoff             3.    Percutaneous transluminal angioplasty and stent placement right superficial femoral artery and popliteal             4.     Percutaneous transluminal angioplasty and stent placement right common iliac artery.                5.    Star close closure left common femoral arteriotomy  Anesthesia: Conscious sedation was administered under my direct supervision by the interventional radiology RN. IV Versed plus fentanyl were utilized. Continuous ECG, pulse oximetry and blood pressure was monitored throughout the entire procedure.  Conscious sedation was for a total of 51 minutes.  Sheath: 6 [place] Rabie left common femoral retrograde  Contrast: 65 cc  Fluoroscopy Time: 7.5 minutes  Indications:  Erik [name] presents with cellulitis and gangrene of the right forefoot.  Clinical exam as well as noninvasive studies demonstrate significant atherosclerotic occlusive disease.  Angiography with hope for intervention has been recommended for limb salvage.  The risks and benefits are reviewed all questions answered patient agrees to proceed.  Procedure:  Erik [name] is a 67 y.o. y.o. male who was identified and appropriate procedural time out was performed.  The patient was then placed supine on the table and prepped and draped in the usual sterile fashion.    Ultrasound was placed in the sterile sleeve and the left groin was evaluated the left common femoral artery was echolucent and  pulsatile indicating patency.  Image was recorded for the permanent record and under real-time visualization a microneedle was inserted into the common femoral artery microwire followed by a micro-sheath.  A J-wire was then advanced through the micro-sheath and a  5 [place] sheath was then inserted over a J-wire. J-wire was then advanced and a 5 French pigtail catheter was positioned at the level of T12. AP projection of the aorta was then obtained. Pigtail catheter was repositioned to above the bifurcation and a LAO view of the pelvis was obtained.  Subsequently a rim catheter with the stiff angle Glidewire was used to cross the aortic bifurcation the catheter wire were advanced down into the right distal external iliac artery. Oblique view of the femoral bifurcation was then obtained and subsequently the wire was reintroduced and the pigtail catheter negotiated into the SFA representing third order catheter placement. Distal runoff was then performed.  5000 units of heparin was then given and allowed to circulate and a 6 [place] Ansell sheath was advanced up and over the bifurcation and positioned in the right common iliac artery  The initial images demonstrate a greater than 90% stenosis in the mid right common iliac artery with poststenotic dilatation associated with this lesion.  The lesion is approximately 20 mm in length and the proximal common measuring 6 mm with the distal common iliac artery measuring 9.5 mm.  Given these findings  a 9 mm x 40 mm life star stent is deployed across the lesion and then postdilated with a 7 mm Lutonix drug-eluting balloon.  Inflation was to 12 atm for approximately 1 minute.  Follow-up imaging demonstrated less than 10% residual stenosis.  The sheath was then advanced under fluoroscopic guidance into the proximal right common femoral artery  The mid SFA lesion was then addressed.  This is a tandem lesion with the more proximal stenosis being 80% with a length of  approximately 15 to 20 mm and then approximately 20 to 30 mm distally there is a 70% stenosis extending over 10 mm.  Initially, a 6 mm x 60 mm Lutonix drug-eluting balloon was used to angioplasty the superficial femoral and above-knee popliteal arteries. Inflations were to 10 atmospheres for 1 minute. Follow-up imaging demonstrated patency with greater than 60% residual stenosis.  I then selected a 7 mm x 80 mm life stent and deployed this across both lesions essentially this is at Hunter's canal.  Subsequently, a 6 mm x 80 mm Lutonix balloon was utilized inflating to 6-8 atm for approximately 1 minute. Follow-up imaging demonstrated less than 10% residual stenosis. Distal runoff was then reassessed.  After review of these images the sheath is pulled into the left external iliac oblique of the common femoral is obtained and a Star close device deployed. There no immediate complications.   Findings:  The abdominal aorta is opacified with a bolus injection contrast. Renal arteries are single there appears to be high-grade stenosis on the right no hemodynamically significant stenosis on the left. The aorta itself has diffuse disease but no hemodynamically significant lesions. The common and external iliac arteries are widely patent on the left but on the right there is a 90% stenosis in the mid right common iliac right external iliac is widely patent.  The right common femoral is widely patent as is the profunda femoris.  The SFA does indeed have a significant stenosis in the area of Hunter's canal as described above.  The distal popliteal diffusely diseased but there are no hemodynamically significant stenoses.  The trifurcation is heavily diseased with occlusion of the anterior tibial peroneal and the posterior tibial is widely patent is approximately 3 mm in diameter and fills the plantar arteries and the pedal arch distally.  Following angioplasty and stent placement the right common iliac artery is widely  patent with less than 10% residual stenosis.  Angioplasty of the SFA at Hunter's canal yields an excellent result with less than 10% residual stenosis.    Summary: Successful recanalization right lower extremity for limb salvage                        Disposition: Patient was taken to the recovery room in stable condition having tolerated the procedure well.  Erik [name], Dolores Lory 08/31/2022,1:03 PM

## 2022-08-31 NOTE — Progress Notes (Signed)
  Progress Note   Patient: INDALECIO MALMSTROM QJJ:[id number] DOB: August 11, 1955 DOA: 08/27/2022     4 DOS: the patient was seen and examined on 08/31/2022   Brief hospital course:  Assessment and Plan: * Atrial fibrillation with RVR (HCC) - Lopressor 100 mg PO bid   CAP (community acquired pneumonia) - Azithromycin 500 mg PO daily   COPD (chronic obstructive pulmonary disease) (HCC) - Breo/incruse ellipta 1 puff daily  - Duoneb tid - Robitussin DM q4 hr PRN   Rhabdomyolysis - Monitor   Right sided weakness - ASA 81 mg PO daily  - Plavix 75 mg PO daily   Bilateral cellulitis of lower leg - s/p recanalization of the R lower extremity for limb salvage 4/81/8563  Chronic systolic CHF (congestive heart failure) (HCC) - Lopressor 100 mg PO bid   CAD (coronary artery disease) - ASA 81 mg PO daily  - Plavix 75 mg PO daily   DVT prophylaxis: Lovenox 55 mg sq daily       Subjective: Pt seen and examined at the bedside. He underwent a successful recanalization of the R lower ext today by vascular.  He has completed his ceftriaxone and has 2 more days of PO azithromycin to complete for his PNA.  Physical Exam: Vitals:   08/31/22 1300 08/31/22 1315 08/31/22 1330 08/31/22 1345  BP: (!) 132/91 (!) 146/96 (!) 146/96 (!) 125/91  Pulse: (!) 112 97 98 100  Resp: (!) 24 (!) 22 (!) 26 (!) 22  Temp:      TempSrc:      SpO2: 94% 93% 92% 92%  Weight:      Height:       Physical Exam Constitutional:      Appearance: Normal appearance.  HENT:     Head: Normocephalic.     Mouth/Throat:     Mouth: Mucous membranes are moist.  Cardiovascular:     Rate and Rhythm: Tachycardia present.  Pulmonary:     Effort: Pulmonary effort is normal.  Abdominal:     Palpations: Abdomen is soft.  Musculoskeletal:     Cervical back: Neck supple.  Skin:    General: Skin is warm.  Neurological:     Mental Status: He is alert. Mental status is at baseline.  Psychiatric:        Behavior: Behavior  normal.     Data Reviewed:   Disposition: Status is: Inpatient  Planned Discharge Destination: Home    Time spent: 35 minutes  Author: Lucienne Minks , MD 08/31/2022 2:50 PM  For on call review [email or web address].

## 2022-09-01 ENCOUNTER — Encounter: Payer: Self-pay | Admitting: Vascular Surgery

## 2022-09-01 LAB — BASIC METABOLIC PANEL
Anion gap: 7 (ref 5–15)
BUN: 16 mg/dL (ref 8–23)
CO2: 30 mmol/L (ref 22–32)
Calcium: 8.3 mg/dL — ABNORMAL LOW (ref 8.9–10.3)
Chloride: 100 mmol/L (ref 98–111)
Creatinine, Ser: 0.81 mg/dL (ref 0.61–1.24)
GFR, Estimated: >60 mL/min (ref >60)
Glucose, Bld: 104 mg/dL — ABNORMAL HIGH (ref 70–99)
Potassium: 4.2 mmol/L (ref 3.5–5.1)
Sodium: 137 mmol/L (ref 135–145)

## 2022-09-01 LAB — CBC
HCT: 42.3 % (ref 39.0–52.0)
Hemoglobin: 13 g/dL (ref 13.0–17.0)
MCH: 29.3 pg (ref 26.0–34.0)
MCHC: 30.7 g/dL (ref 30.0–36.0)
MCV: 95.5 fL (ref 80.0–100.0)
Platelets: 408 10*3/uL — ABNORMAL HIGH (ref 150–400)
RBC: 4.43 MIL/uL (ref 4.22–5.81)
RDW: 14.8 % (ref 11.5–15.5)
WBC: 15.2 10*3/uL — ABNORMAL HIGH (ref 4.0–10.5)
nRBC: 0 % (ref 0.0–0.2)

## 2022-09-01 LAB — CULTURE, BLOOD (ROUTINE X 2)
Culture: NO GROWTH
Culture: NO GROWTH
Special Requests: ADEQUATE
Special Requests: ADEQUATE

## 2022-09-01 LAB — CK: Total CK: 65 U/L (ref 49–397)

## 2022-09-01 LAB — MAGNESIUM: Magnesium: 2.2 mg/dL (ref 1.7–2.4)

## 2022-09-01 NOTE — Progress Notes (Signed)
Physical Therapy Treatment Patient Details Name: Erik [name] MRN: [id number] DOB: 06/12/1956 Today's Date: 09/01/2022   History of Present Illness Nafis L Mccannon is a 67 y.o. male with medical history significant of CAD s/p PCI, HFrEF, permanent A-fib not on AC, COPD on home 2 L O2, chronic venous stasis who presents to the ED after a ground-level fall.  Night before presentation, pt stated that he felt sudden onset worsening shortness of breath, dizziness and elevated heart rate.    Patient receiving inpatient treatment for A-fib, CAP, acute on chronic respiratory failure from COPD, rhabdomyolysis, and bilateral LE cellulitis.  Pt's head CT scan negative for acute findings, though patient experiencing R side weakness.    PT Comments    Pt is making gradual progress towards goals s/p angiogram on 08/31/22. Still very limited in regards to mobility. On 4L of O2 with sats decreasing to 82% with exertion, recovers fairly quickly to 100% with resting and PLB. HR increases to 122 with repositioning. Currently isn't safe to transfer/stand, however would be able to sit at EOB for meals. Discussed with RN staff. Will continue to progress. Pt adamant about going home. Reached out to OT for theraband there-ex secondary to weakness.   Recommendations for follow up therapy are one component of a multi-disciplinary discharge planning process, led by the attending physician.  Recommendations may be updated based on patient status, additional functional criteria and insurance authorization.  Follow Up Recommendations  Skilled nursing-short term rehab (<3 hours/day) (currently refusing recommendation) Can patient physically be transported by private vehicle: No   Assistance Recommended at Discharge Frequent or constant Supervision/Assistance  Patient can return home with the following Two people to help with walking and/or transfers;Two people to help with bathing/dressing/bathroom   Equipment  Recommendations   (TBD)    Recommendations for Other Services       Precautions / Restrictions Precautions Precautions: Fall Restrictions Weight Bearing Restrictions: No     Mobility  Bed Mobility Overal bed mobility: Needs Assistance Bed Mobility: Supine to Sit     Supine to sit: Mod assist     General bed mobility comments: needs mod assist for B LEs. once seated, was able to sit with supervision and upright posture. Unable to stand secondary to vitals and back pain. Needs assist for return to bed and repositioning.    Transfers                   General transfer comment: unable    Ambulation/Gait                   Stairs             Wheelchair Mobility    Modified Rankin (Stroke Patients Only)       Balance Overall balance assessment: Needs assistance Sitting-balance support: Single extremity supported Sitting balance-Leahy Scale: Good                                      Cognition Arousal/Alertness: Awake/alert Behavior During Therapy: WFL for tasks assessed/performed Overall Cognitive Status: Within Functional Limits for tasks assessed                                 General Comments: grumpy, however able to follow commands        Exercises Other Exercises Other Exercises:  seated ther-ex performed on B LE including LAQ. 10 reps. Also performed breathing exercises and weight shifts in sitting.    General Comments        Pertinent Vitals/Pain Pain Assessment Pain Assessment: Faces Faces Pain Scale: Hurts even more Pain Location: back Pain Descriptors / Indicators: Moaning, Grimacing, Guarding Pain Intervention(s): Repositioned    Home Living                          Prior Function            PT Goals (current goals can now be found in the care plan section) Acute Rehab PT Goals Patient Stated Goal: to get stronger PT Goal Formulation: With patient Time For Goal  Achievement: 09/12/22 Potential to Achieve Goals: Good Progress towards PT goals: Progressing toward goals    Frequency    Min 2X/week      PT Plan Current plan remains appropriate    Co-evaluation              AM-PAC PT "6 Clicks" Mobility   Outcome Measure  Help needed turning from your back to your side while in a flat bed without using bedrails?: A Lot Help needed moving from lying on your back to sitting on the side of a flat bed without using bedrails?: A Lot Help needed moving to and from a bed to a chair (including a wheelchair)?: Total Help needed standing up from a chair using your arms (e.g., wheelchair or bedside chair)?: Total Help needed to walk in hospital room?: Total Help needed climbing 3-5 steps with a railing? : Total 6 Click Score: 8    End of Session Equipment Utilized During Treatment: Oxygen Activity Tolerance: Patient limited by pain Patient left: in bed;with bed alarm set Nurse Communication: Mobility status PT Visit Diagnosis: Muscle weakness (generalized) (M62.81);Pain Pain - Right/Left:  (back) Pain - part of body:  (back)     Time: 6237-6283 PT Time Calculation (min) (ACUTE ONLY): 31 min  Charges:  $Therapeutic Exercise: 8-22 mins $Therapeutic Activity: 8-22 mins                     Greggory Stallion, PT, DPT, GCS [phone]    Angelly Spearing 09/01/2022, 3:15 PM

## 2022-09-01 NOTE — Progress Notes (Signed)
Progress Note    09/01/2022 5:01 PM 1 Day Post-Op  Subjective:  Erik [name] is a 67 y.o. male with medical history significant of CAD s/p PCI, HFrEF, permanent A-fib not on AC, COPD on home 2 L O2, chronic venous stasis who presented to the ED after a ground-level fall.   Today is S/P POD #1  Percutaneous transluminal angioplasty and stent placement right superficial femoral artery and popliteal artery with Percutaneous transluminal angioplasty and stent placement right common iliac artery.    Vitals:   09/01/22 1246 09/01/22 1622  BP: (!) 125/99 125/87  Pulse: 98 (!) 109  Resp: 16 18  Temp: 98 F (36.7 C) 98.2 F (36.8 C)  SpO2: 100% 93%   Physical Exam: Cardiac:  Irregular Rhythm with tachycardia.  Lungs:  Labored due to HF Incisions:  Left groin. Dressing C/D/I  Extremities:  Right lower extremity is wrapped with ace bandage. Foot remains bluish/purple but warm to touch.  Abdomen:  Positive bowel sounds, soft NT/ND  Neurologic: AAOX3  CBC    Component Value Date/Time   WBC 15.2 (H) 09/01/2022 0641   RBC 4.43 09/01/2022 0641   HGB 13.0 09/01/2022 0641   HGB 15.1 02/19/2020 0000   HCT 42.3 09/01/2022 0641   HCT 45.6 02/19/2020 0000   PLT 408 (H) 09/01/2022 0641   PLT 345 02/19/2020 0000   MCV 95.5 09/01/2022 0641   MCV 90 02/19/2020 0000   MCV 91 07/19/2013 0417   MCH 29.3 09/01/2022 0641   MCHC 30.7 09/01/2022 0641   RDW 14.8 09/01/2022 0641   RDW 12.9 02/19/2020 0000   RDW 14.2 07/19/2013 0417   LYMPHSABS 1.4 08/27/2022 1356   LYMPHSABS 2.2 02/19/2020 0000   LYMPHSABS 2.1 07/19/2013 0417   MONOABS 2.0 (H) 08/27/2022 1356   MONOABS 1.5 (H) 07/19/2013 0417   EOSABS 0.0 08/27/2022 1356   EOSABS 0.2 02/19/2020 0000   EOSABS 0.1 07/19/2013 0417   BASOSABS 0.0 08/27/2022 1356   BASOSABS 0.0 02/19/2020 0000   BASOSABS 0.0 07/19/2013 0417    BMET    Component Value Date/Time   NA 137 09/01/2022 0641   NA 138 02/19/2020 0000   NA 135 (L) 07/19/2013  0417   K 4.2 09/01/2022 0641   K 3.9 07/19/2013 0417   CL 100 09/01/2022 0641   CL 101 07/19/2013 0417   CO2 30 09/01/2022 0641   CO2 29 07/19/2013 0417   GLUCOSE 104 (H) 09/01/2022 0641   GLUCOSE 86 07/19/2013 0417   BUN 16 09/01/2022 0641   BUN 14 02/19/2020 0000   BUN 10 07/19/2013 0417   CREATININE 0.81 09/01/2022 0641   CREATININE 0.80 07/19/2013 0417   CALCIUM 8.3 (L) 09/01/2022 0641   CALCIUM 8.4 (L) 07/19/2013 0417   GFRNONAA >60 09/01/2022 0641   GFRNONAA >60 07/19/2013 0417   GFRAA 105 02/19/2020 0000   GFRAA >60 07/19/2013 0417    INR    Component Value Date/Time   INR 1.2 08/27/2022 1356     Intake/Output Summary (Last 24 hours) at 09/01/2022 1701 Last data filed at 09/01/2022 0900 Gross per 24 hour  Intake 723 ml  Output 950 ml  Net -227 ml     Assessment/Plan:  67 y.o. male is s/p Percutaneous transluminal angioplasty and stent placement right superficial femoral artery and popliteal artery with Percutaneous transluminal angioplasty and stent placement right common iliac artery.  1 Day Post-Op   PLAN:  Patient restarted on ASA 81 mg PO daily and Plavix  75 mg PO Daily  Patient continues on Azithromycin 500 mg PO Daily for CAP 2 More Days Discharge to Home when appropriate.   DVT prophylaxis:  Lovenox 55mg  SQ QD   Drema P[facility] 09/01/2022 5:01 PM

## 2022-09-01 NOTE — Progress Notes (Signed)
Occupational Therapy Treatment Patient Details Name: Erik [name] MRN: [id number] DOB: [birth year] Today's Date: 09/01/2022   History of present illness Erik [name] is a 67 y.o. male with medical history significant of CAD s/p PCI, HFrEF, permanent A-fib not on AC, COPD on home 2 L O2, chronic venous stasis who presents to the ED after a ground-level fall.  Night before presentation, pt stated that he felt sudden onset worsening shortness of breath, dizziness and elevated heart rate.    Patient receiving inpatient treatment for A-fib, CAP, acute on chronic respiratory failure from COPD, rhabdomyolysis, and bilateral LE cellulitis.  Pt's head CT scan negative for acute findings, though patient experiencing R side weakness.   OT comments  Mr. Kaczmarek continues to present with high level of back pain with movement, limited endurance, SOB. He requires Mod A for supine<sit, with pt able to move b/l LE only inches as a time, then requires a rest break. O2 sats in supine = 97%, HR in supine = 88. With supine<sit, O2 sats drop to 86%, HR increases to 101. Pt tends to take fast, shallow breaths and endorses feeling "panicky." Provided educ re: slow, deep breathing and anxiety-mgmt techniques. With RW and +2 Mod assist from elevated surface, pt able to come into standing and to step-pivot transfer to recliner with Min A +2 and with cueing for repositioning feet. Pt left in recliner, stating he is very grateful to be out of bed for the first time in many days. Based on pt's deconditioning and significant care needs for all fxl mobility, continue to recommend DC to SNF; however, pt states he intends to DC to home.    Recommendations for follow up therapy are one component of a multi-disciplinary discharge planning process, led by the attending physician.  Recommendations may be updated based on patient status, additional functional criteria and insurance authorization.    Follow Up Recommendations  Skilled  nursing-short term rehab (<3 hours/day)     Assistance Recommended at Discharge Frequent or constant Supervision/Assistance  Patient can return home with the following  Two people to help with walking and/or transfers;A lot of help with bathing/dressing/bathroom;Assistance with cooking/housework;Assist for transportation;Help with stairs or ramp for entrance   Equipment Recommendations       Recommendations for Other Services      Precautions / Restrictions Precautions Precautions: Fall Precaution Comments: watch O2, HR Restrictions Weight Bearing Restrictions: No       Mobility Bed Mobility Overal bed mobility: Needs Assistance Bed Mobility: Supine to Sit     Supine to sit: Mod assist     General bed mobility comments: Greatly increased time and effort for supine<sit. Mod A for repositioning b/l LE. Pt yelling out with pain w/ all movements.    Transfers Overall transfer level: Needs assistance Equipment used: Rolling walker (2 wheels) Transfers: Sit to/from Stand, Bed to chair/wheelchair/BSC Sit to Stand: Mod assist, +2 physical assistance     Step pivot transfers: Min assist, +2 physical assistance           Balance Overall balance assessment: Needs assistance Sitting-balance support: Bilateral upper extremity supported Sitting balance-Leahy Scale: Good Sitting balance - Comments: Difficulty maintaining trunk control, requiring b/l UE support. Significant pain w/ sitting Postural control: Posterior lean, Right lateral lean Standing balance support: Reliant on assistive device for balance, During functional activity, Bilateral upper extremity supported Standing balance-Leahy Scale: Fair  ADL either performed or assessed with clinical judgement   ADL Overall ADL's : Needs assistance/impaired Eating/Feeding: Set up;Bed level                   Lower Body Dressing: Maximal assistance                       Extremity/Trunk Assessment Upper Extremity Assessment Upper Extremity Assessment: Generalized weakness   Lower Extremity Assessment Lower Extremity Assessment: Generalized weakness        Vision       Perception     Praxis      Cognition Arousal/Alertness: Awake/alert Behavior During Therapy: WFL for tasks assessed/performed Overall Cognitive Status: Within Functional Limits for tasks assessed                                          Exercises Other Exercises Other Exercises: Educ re: breathing techniques, anxiety mgmt, home safety, DC recs    Shoulder Instructions       General Comments      Pertinent Vitals/ Pain       Pain Assessment Pain Assessment: 0-10 Pain Score: 8  Pain Location: back Pain Descriptors / Indicators: Moaning, Grimacing, Guarding Pain Intervention(s): Repositioned, Utilized relaxation techniques  Home Living                                          Prior Functioning/Environment              Frequency  Min 2X/week        Progress Toward Goals  OT Goals(current goals can now be found in the care plan section)  Progress towards OT goals: Progressing toward goals  Acute Rehab OT Goals OT Goal Formulation: With patient Time For Goal Achievement: 09/12/22 Potential to Achieve Goals: [place] Discharge plan remains appropriate;Frequency remains appropriate    Co-evaluation                 AM-PAC OT "6 Clicks" Daily Activity     Outcome Measure   Help from another person eating meals?: None Help from another person taking care of personal grooming?: A Little Help from another person toileting, which includes using toliet, bedpan, or urinal?: A Lot Help from another person bathing (including washing, rinsing, drying)?: A Lot Help from another person to put on and taking off regular upper body clothing?: A Lot Help from another person to put on and taking off regular lower body  clothing?: A Lot 6 Click Score: 15    End of Session Equipment Utilized During Treatment: Rolling walker (2 wheels);Oxygen  OT Visit Diagnosis: Other abnormalities of gait and mobility (R26.89);Muscle weakness (generalized) (M62.81)   Activity Tolerance Patient tolerated treatment well;Other (comment);Patient limited by pain (pt limited by SOB, elevated HR)   Patient Left in chair;with call bell/phone within reach   Nurse Communication          Time: 1525-1600 OT Time Calculation (min): 35 min  Charges: OT General Charges $OT Visit: 1 Visit OT Treatments $Self Care/Home Management : 23-37 mins  Josiah Lobo, PhD, MS, OTR/L 09/01/22, 5:25 PM

## 2022-09-01 NOTE — Progress Notes (Signed)
  Progress Note   Patient: DANTRE YEARWOOD PZW:[id number] DOB: 10-20-1955 DOA: 08/27/2022     5 DOS: the patient was seen and examined on 09/01/2022   Brief hospital course:  Assessment and Plan:  * Atrial fibrillation with RVR (HCC) - Lopressor 100 mg PO bid    CAP (community acquired pneumonia) - Completed antibx course    COPD (chronic obstructive pulmonary disease) (HCC) - Breo/incruse ellipta 1 puff daily  - Duoneb tid - Robitussin DM q4 hr PRN    Rhabdomyolysis - Resolved    Right sided weakness - ASA 81 mg PO daily  - Plavix 75 mg PO daily    Bilateral cellulitis of lower leg - s/p recanalization of the R lower extremity for limb salvage 11/29/5359   Chronic systolic CHF (congestive heart failure) (HCC) - Lopressor 100 mg PO bid    CAD (coronary artery disease) - ASA 81 mg PO daily  - Plavix 75 mg PO daily    DVT prophylaxis: Lovenox 55 mg sq daily      Subjective: Pt seen and examined at the bedside. PT has advised today that the pt has refused SNF and his O2 sat goes down to 82% on 4L with tachycardia. He is unable to get OOB on his own. He will remain in the hospital until he is more stable.    Physical Exam: Vitals:   08/31/22 2346 09/01/22 0417 09/01/22 0817 09/01/22 1246  BP: (!) 134/99 (!) 125/96 131/84 (!) 125/99  Pulse: 97 99 88 98  Resp: 20 20 18 16   Temp: 97.7 F (36.5 C) 97.8 F (36.6 C) 97.9 F (36.6 C) 98 F (36.7 C)  TempSrc:  Oral Oral   SpO2: 100% 93% 100% 100%  Weight:      Height:       Constitutional:      Appearance: Normal appearance.  HENT:     Head: Normocephalic.     Mouth/Throat:     Mouth: Mucous membranes are moist.  Cardiovascular:     Rate and Rhythm: RRR Pulmonary:     Effort: Pulmonary effort is normal.  Abdominal:     Palpations: Abdomen is soft.  Musculoskeletal:     Cervical back: Neck supple.  Skin:    General: Skin is warm.  Neurological:     Mental Status: He is alert. Mental status is at baseline.   Psychiatric:        Behavior: Behavior normal.  Data Reviewed:   Disposition: Status is: Inpatient  Planned Discharge Destination: Barriers to discharge: Debility and desaturation with ambulation     Time spent: 35 minutes  Author: Lucienne Minks , MD 09/01/2022 1:34 PM  For on call review [email or web address].

## 2022-09-02 DIAGNOSIS — I739 Peripheral vascular disease, unspecified: Secondary | ICD-10-CM

## 2022-09-02 LAB — BASIC METABOLIC PANEL
Anion gap: 7 (ref 5–15)
BUN: 16 mg/dL (ref 8–23)
CO2: 32 mmol/L (ref 22–32)
Calcium: 8.3 mg/dL — ABNORMAL LOW (ref 8.9–10.3)
Chloride: 99 mmol/L (ref 98–111)
Creatinine, Ser: 0.76 mg/dL (ref 0.61–1.24)
GFR, Estimated: >60 mL/min (ref >60)
Glucose, Bld: 118 mg/dL — ABNORMAL HIGH (ref 70–99)
Potassium: 3.7 mmol/L (ref 3.5–5.1)
Sodium: 138 mmol/L (ref 135–145)

## 2022-09-02 LAB — GLUCOSE, CAPILLARY
Glucose-Capillary: 166 mg/dL — ABNORMAL HIGH (ref 70–99)
Glucose-Capillary: 189 mg/dL — ABNORMAL HIGH (ref 70–99)

## 2022-09-02 LAB — CBC
HCT: 42 % (ref 39.0–52.0)
Hemoglobin: 12.7 g/dL — ABNORMAL LOW (ref 13.0–17.0)
MCH: 29.1 pg (ref 26.0–34.0)
MCHC: 30.2 g/dL (ref 30.0–36.0)
MCV: 96.3 fL (ref 80.0–100.0)
Platelets: 403 10*3/uL — ABNORMAL HIGH (ref 150–400)
RBC: 4.36 MIL/uL (ref 4.22–5.81)
RDW: 14.6 % (ref 11.5–15.5)
WBC: 15 10*3/uL — ABNORMAL HIGH (ref 4.0–10.5)
nRBC: 0 % (ref 0.0–0.2)

## 2022-09-02 LAB — MAGNESIUM: Magnesium: 2 mg/dL (ref 1.7–2.4)

## 2022-09-02 MED ORDER — FAMOTIDINE IN NACL 20-0.9 MG/50ML-% IV SOLN
20.0000 mg | Freq: Two times a day (BID) | INTRAVENOUS | Status: AC
  Administered 2022-09-02 – 2022-09-03 (×2): 20 mg via INTRAVENOUS
  Filled 2022-09-02 (×2): qty 50

## 2022-09-02 MED ORDER — METHYLPREDNISOLONE SODIUM SUCC 40 MG IJ SOLR
40.0000 mg | Freq: Two times a day (BID) | INTRAMUSCULAR | Status: DC
  Administered 2022-09-02: 40 mg via INTRAVENOUS
  Filled 2022-09-02: qty 1

## 2022-09-02 MED ORDER — METHYLPREDNISOLONE SODIUM SUCC 125 MG IJ SOLR
125.0000 mg | INTRAMUSCULAR | Status: AC
  Administered 2022-09-02: 125 mg via INTRAVENOUS

## 2022-09-02 MED ORDER — METHYLPREDNISOLONE SODIUM SUCC 125 MG IJ SOLR
INTRAMUSCULAR | Status: AC
  Filled 2022-09-02: qty 2

## 2022-09-02 MED ORDER — METHYLPREDNISOLONE SODIUM SUCC 125 MG IJ SOLR
120.0000 mg | Freq: Two times a day (BID) | INTRAMUSCULAR | Status: AC
  Administered 2022-09-03 (×2): 120 mg via INTRAVENOUS
  Filled 2022-09-02 (×2): qty 2

## 2022-09-02 MED ORDER — RACEPINEPHRINE HCL 2.25 % IN NEBU
0.5000 mL | INHALATION_SOLUTION | Freq: Once | RESPIRATORY_TRACT | Status: AC
  Administered 2022-09-02: 0.5 mL via RESPIRATORY_TRACT
  Filled 2022-09-02: qty 0.5

## 2022-09-02 MED ORDER — INSULIN ASPART 100 UNIT/ML IJ SOLN
0.0000 [IU] | INTRAMUSCULAR | Status: DC
  Administered 2022-09-02: 3 [IU] via SUBCUTANEOUS
  Administered 2022-09-03: 5 [IU] via SUBCUTANEOUS
  Administered 2022-09-03 (×2): 3 [IU] via SUBCUTANEOUS
  Administered 2022-09-03: 5 [IU] via SUBCUTANEOUS
  Administered 2022-09-03: 3 [IU] via SUBCUTANEOUS
  Administered 2022-09-04 (×2): 2 [IU] via SUBCUTANEOUS
  Administered 2022-09-04 (×3): 5 [IU] via SUBCUTANEOUS
  Administered 2022-09-04: 3 [IU] via SUBCUTANEOUS
  Administered 2022-09-05 (×2): 2 [IU] via SUBCUTANEOUS
  Administered 2022-09-05: 3 [IU] via SUBCUTANEOUS
  Administered 2022-09-06: 2 [IU] via SUBCUTANEOUS
  Filled 2022-09-02 (×16): qty 1

## 2022-09-02 MED ORDER — HYDROXYZINE HCL 25 MG PO TABS
25.0000 mg | ORAL_TABLET | Freq: Three times a day (TID) | ORAL | Status: AC
  Administered 2022-09-02 – 2022-09-03 (×3): 25 mg via ORAL
  Filled 2022-09-02 (×3): qty 1

## 2022-09-02 MED ORDER — METHYLPREDNISOLONE SODIUM SUCC 40 MG IJ SOLR
INTRAMUSCULAR | Status: AC
  Filled 2022-09-02: qty 1

## 2022-09-02 MED ORDER — HYDROXYZINE HCL 10 MG PO TABS
10.0000 mg | ORAL_TABLET | Freq: Three times a day (TID) | ORAL | Status: DC | PRN
  Administered 2022-09-02: 10 mg via ORAL
  Filled 2022-09-02 (×2): qty 1

## 2022-09-02 MED ORDER — GABAPENTIN 100 MG PO CAPS
100.0000 mg | ORAL_CAPSULE | Freq: Three times a day (TID) | ORAL | Status: DC
  Administered 2022-09-02 – 2022-09-03 (×5): 100 mg via ORAL
  Filled 2022-09-02 (×5): qty 1

## 2022-09-02 MED ORDER — LORATADINE 10 MG PO TABS
10.0000 mg | ORAL_TABLET | Freq: Every day | ORAL | Status: DC
  Filled 2022-09-02: qty 1

## 2022-09-02 MED ORDER — OXYCODONE HCL 5 MG PO TABS
5.0000 mg | ORAL_TABLET | Freq: Four times a day (QID) | ORAL | Status: DC | PRN
  Administered 2022-09-02: 5 mg via ORAL
  Filled 2022-09-02: qty 1

## 2022-09-02 NOTE — Progress Notes (Signed)
Pt. Removed his left femoral dressing in the morning.  Site found to be oozing/bleeding slowly.  Dressing applied (xeroform, gauze, silk tape for pressure).  Owens Shark, NP and Feliberto Gottron, MD notified.  Pt. Distal pulses intact via doppler.  Will continue to monitor

## 2022-09-02 NOTE — Care Management Important Message (Signed)
Important Message  Patient Details  Name: Erik [name] MRN: [id number] Date of Birth: 03-Nov-1955   Medicare Important Message Given:  Yes     [name] 09/02/2022, 12:16 PM

## 2022-09-02 NOTE — Progress Notes (Signed)
  Progress Note   Patient: Erik [name] [id number]:[id number] DOB: 1956-06-11 DOA: 08/27/2022     6 DOS: the patient was seen and examined on 09/02/2022   Brief hospital course:  Assessment and Plan:  * Atrial fibrillation with RVR (HCC) - Lopressor 100 mg PO bid    CAP (community acquired pneumonia) - Completed antibx course    COPD (chronic obstructive pulmonary disease) (HCC) - Breo/incruse ellipta 1 puff daily  - Duoneb tid - Robitussin DM q4 hr PRN    Rhabdomyolysis - Resolved    Right sided weakness - ASA 81 mg PO daily  - Plavix 75 mg PO daily    Bilateral cellulitis of lower leg - s/p recanalization of the R lower extremity for limb salvage 08/31/2022 - Gabapentin 100 mg PO tid  - Oxycodone 5 mg PO q6 hr PRN    Chronic systolic CHF (congestive heart failure) (HCC) - Lopressor 100 mg PO bid    CAD (coronary artery disease) - ASA 81 mg PO daily  - Plavix 75 mg PO daily    DVT prophylaxis: Lovenox 55 mg sq daily       Subjective: Pt seen and examined at the bedside. He removed his dressing and picked at his scab in the insertion site of his groin. Vascular was notified. Today's dose of Lovenox will be held. Continue with ASA and Plavix per vascular.   Pt is aware that he is not ready for discharge as he is desaturating with ambulation. Continue PT/OT.  Physical Exam: Vitals:   09/01/22 2322 09/02/22 0321 09/02/22 0743 09/02/22 1119  BP: 117/76 130/83 (!) 138/104 (!) 126/97  Pulse: 94 98 (!) 105 (!) 109  Resp: 20 20 19 20   Temp: 98.6 F (37 C) 97.6 F (36.4 C) 97.7 F (36.5 C) 97.9 F (36.6 C)  TempSrc: Oral  Oral Oral  SpO2: 99% 97% 100% 97%  Weight:      Height:       Constitutional:      Appearance: Normal appearance.  HENT:     Head: Normocephalic.     Mouth/Throat:     Mouth: Mucous membranes are moist.  Cardiovascular:     Rate and Rhythm: RRR Pulmonary:     Effort: Pulmonary effort is normal.  Abdominal:     Palpations: Abdomen is soft.   Musculoskeletal:     Cervical back: Neck supple.  Skin:    General: Skin is warm.  Neurological:     Mental Status: He is alert. Mental status is at baseline.  Psychiatric:        Behavior: Behavior normal.  Data Reviewed:   Disposition: Status is: Inpatient  Planned Discharge Destination: Skilled nursing facility    Time spent: 35 minutes  Author: Lucienne Minks , MD 09/02/2022 12:30 PM  For on call review [email or web address].

## 2022-09-02 NOTE — TOC Progression Note (Signed)
Transition of Care [facility]) - Progression Note    Patient Details  Name: Erik [name] MRN: [id number] Date of Birth: 01-01-1956  Transition of Care [facility]) CM/SW [place], [place] Phone Number: 09/02/2022, 2:34 PM  Clinical Narrative:     CSW notes patient wishes to dc home once medically stable and declines snf.   Patient has weaned from HFNC 45L O2 to 4L nasal cannula  Patient was previously active with [facility] for RN for wound care, can add PT and OT services once medically stable to discharge.   Expected Discharge Plan:  (TBD) Barriers to Discharge: Continued Medical Work up  Expected Discharge Plan and Services       Living arrangements for the past 2 months: Single Family Home                                       Social Determinants of Health (SDOH) Interventions SDOH Screenings   Food Insecurity: No Food Insecurity (07/14/2022)  Housing: Low Risk  (07/14/2022)  Transportation Needs: No Transportation Needs (07/14/2022)  Utilities: Not At Risk (07/14/2022)  Alcohol Screen: Low Risk  (02/19/2020)  Depression (PHQ2-9): Low Risk  (08/25/2020)  Financial Resource Strain: Low Risk  (11/04/2020)  Tobacco Use: Medium Risk (09/01/2022)    Readmission Risk Interventions     No data to display

## 2022-09-02 NOTE — Progress Notes (Signed)
[facility]  Daily Progress Note   Subjective  -   Patient notes no significant issue pain post intervention however he developed some oozing and bleeding from his groin site overnight.  Objective Vitals:   09/01/22 2322 09/02/22 0321 09/02/22 0743 09/02/22 1119  BP: 117/76 130/83 (!) 138/104 (!) 126/97  Pulse: 94 98 (!) 105 (!) 109  Resp: 20 20 19 20   Temp: 98.6 F (37 C) 97.6 F (36.4 C) 97.7 F (36.5 C) 97.9 F (36.6 C)  TempSrc: Oral  Oral Oral  SpO2: 99% 97% 100% 97%  Weight:      Height:        Intake/Output Summary (Last 24 hours) at 09/02/2022 1231 Last data filed at 09/02/2022 1028 Gross per 24 hour  Intake --  Output 550 ml  Net -550 ml    PULM  CTAB CV  RRR VASC  bilateral feet warm with dopplerable pulses  Laboratory CBC    Component Value Date/Time   WBC 15.0 (H) 09/02/2022 0652   HGB 12.7 (L) 09/02/2022 0652   HGB 15.1 02/19/2020 0000   HCT 42.0 09/02/2022 0652   HCT 45.6 02/19/2020 0000   PLT 403 (H) 09/02/2022 0652   PLT 345 02/19/2020 0000    BMET    Component Value Date/Time   NA 138 09/02/2022 0652   NA 138 02/19/2020 0000   NA 135 (L) 07/19/2013 0417   K 3.7 09/02/2022 0652   K 3.9 07/19/2013 0417   CL 99 09/02/2022 0652   CL 101 07/19/2013 0417   CO2 32 09/02/2022 0652   CO2 29 07/19/2013 0417   GLUCOSE 118 (H) 09/02/2022 0652   GLUCOSE 86 07/19/2013 0417   BUN 16 09/02/2022 0652   BUN 14 02/19/2020 0000   BUN 10 07/19/2013 0417   CREATININE 0.76 09/02/2022 0652   CREATININE 0.80 07/19/2013 0417   CALCIUM 8.3 (L) 09/02/2022 0652   CALCIUM 8.4 (L) 07/19/2013 0417   GFRNONAA >60 09/02/2022 0652   GFRNONAA >60 07/19/2013 0417   GFRAA 105 02/19/2020 0000   GFRAA >60 07/19/2013 0417    Assessment/Planning: POD #2 s/p right lower extremity angiogram  The patient accidentally scratched his incision from his recent intervention has had episodes of oozing bleeding. On evaluation the pressure dressing is  intact with no evidence of bleeding currently.  We have a Xeroform placed on top of the wound with gauze dressing. Will reevaluate the wound in 3 to 4 hours to evaluate if it is continuing to ooze, if so we may use a lidocaine with epi injection to assist. Otherwise the patient remains hemodynamically stable with no significant drop in hemoglobin.  He continues to have a warm well-perfused right lower extremity with dopplerable pulses  Kris Hartmann  09/02/2022, 12:31 PM

## 2022-09-02 NOTE — Progress Notes (Addendum)
CROSS COVER NOTE  NAME: Erik [name] MRN: [id number] DOB : Dec 08, 1955     HPI/Events of Note   Nurse reports worsening angioedema with swelling of lips and tongue post previously administered atarax and 40 solumedrol before shift change   Assessment and  Interventions   Assessment: VSS A & O BBS diminished bases, initially mild stridor trachea midline significant swelling of lips and tongue, unable to visualize back of mouth Had similar reaction in past to morphine. Earlier in day had initally develop rash after receiving oxycodone Plan: Solumedrol 125 stat f/b 60 every 6 Racemic epi neb Vistaril every 8 h for 24 hours Continue pepcid every 12h  Discontinue oxycodone and all morphine related drugs Cbg every 4 with SSI secondary to high dose steroids  Patient improving and states he feel much better.  No stridor. Obvious improvement in edema. Wife updated on phone       Kathlene Cote NP [facility]

## 2022-09-03 LAB — CBC
HCT: 42.8 % (ref 39.0–52.0)
Hemoglobin: 13.2 g/dL (ref 13.0–17.0)
MCH: 28.9 pg (ref 26.0–34.0)
MCHC: 30.8 g/dL (ref 30.0–36.0)
MCV: 93.9 fL (ref 80.0–100.0)
Platelets: 449 10*3/uL — ABNORMAL HIGH (ref 150–400)
RBC: 4.56 MIL/uL (ref 4.22–5.81)
RDW: 14.5 % (ref 11.5–15.5)
WBC: 10.6 10*3/uL — ABNORMAL HIGH (ref 4.0–10.5)
nRBC: 0 % (ref 0.0–0.2)

## 2022-09-03 LAB — COMPREHENSIVE METABOLIC PANEL
ALT: 44 U/L (ref 0–44)
AST: 27 U/L (ref 15–41)
Albumin: 2.7 g/dL — ABNORMAL LOW (ref 3.5–5.0)
Alkaline Phosphatase: 86 U/L (ref 38–126)
Anion gap: 9 (ref 5–15)
BUN: 14 mg/dL (ref 8–23)
CO2: 30 mmol/L (ref 22–32)
Calcium: 8.3 mg/dL — ABNORMAL LOW (ref 8.9–10.3)
Chloride: 98 mmol/L (ref 98–111)
Creatinine, Ser: 0.75 mg/dL (ref 0.61–1.24)
GFR, Estimated: >60 mL/min (ref >60)
Glucose, Bld: 150 mg/dL — ABNORMAL HIGH (ref 70–99)
Potassium: 4.6 mmol/L (ref 3.5–5.1)
Sodium: 137 mmol/L (ref 135–145)
Total Bilirubin: 0.6 mg/dL (ref 0.3–1.2)
Total Protein: 6.1 g/dL — ABNORMAL LOW (ref 6.5–8.1)

## 2022-09-03 LAB — GLUCOSE, CAPILLARY
Glucose-Capillary: 158 mg/dL — ABNORMAL HIGH (ref 70–99)
Glucose-Capillary: 175 mg/dL — ABNORMAL HIGH (ref 70–99)
Glucose-Capillary: 150 mg/dL — ABNORMAL HIGH (ref 70–99)
Glucose-Capillary: 212 mg/dL — ABNORMAL HIGH (ref 70–99)
Glucose-Capillary: 218 mg/dL — ABNORMAL HIGH (ref 70–99)
Glucose-Capillary: 191 mg/dL — ABNORMAL HIGH (ref 70–99)

## 2022-09-03 LAB — LIPID PANEL
Cholesterol: 139 mg/dL (ref 0–200)
HDL: 37 mg/dL — ABNORMAL LOW (ref >40)
LDL Cholesterol: 89 mg/dL (ref 0–99)
Total CHOL/HDL Ratio: 3.8 RATIO
Triglycerides: 65 mg/dL (ref <150)
VLDL: 13 mg/dL (ref 0–40)

## 2022-09-03 LAB — MAGNESIUM: Magnesium: 2.1 mg/dL (ref 1.7–2.4)

## 2022-09-03 MED ORDER — ALBUTEROL SULFATE (2.5 MG/3ML) 0.083% IN NEBU
2.5000 mg | INHALATION_SOLUTION | RESPIRATORY_TRACT | Status: DC | PRN
  Administered 2022-09-03 – 2022-09-07 (×5): 2.5 mg via RESPIRATORY_TRACT
  Filled 2022-09-03 (×5): qty 3

## 2022-09-03 MED ORDER — FAMOTIDINE IN NACL 20-0.9 MG/50ML-% IV SOLN
20.0000 mg | Freq: Two times a day (BID) | INTRAVENOUS | Status: DC
  Administered 2022-09-03 – 2022-09-04 (×3): 20 mg via INTRAVENOUS
  Filled 2022-09-03 (×3): qty 50

## 2022-09-03 NOTE — Progress Notes (Signed)
  Progress Note   Patient: Erik [name] [id number] DOB: [birth year] DOA: 08/27/2022     7 DOS: the patient was seen and examined on 09/03/2022   Brief hospital course:  Assessment and Plan:  * Atrial fibrillation with RVR (HCC) - Lopressor 100 mg PO bid    CAP (community acquired pneumonia) - Completed antibx course    COPD (chronic obstructive pulmonary disease) (HCC) - Breo/incruse ellipta 1 puff daily  - Duoneb tid - Robitussin DM q4 hr PRN    Rhabdomyolysis - Resolved    Right sided weakness - ASA 81 mg PO daily  - Plavix 75 mg PO daily    Bilateral cellulitis of lower leg - s/p recanalization of the R lower extremity for limb salvage 08/31/2022 - Gabapentin 100 mg PO tid    Chronic systolic CHF (congestive heart failure) (HCC) - Lopressor 100 mg PO bid    CAD (coronary artery disease) - ASA 81 mg PO daily  - Plavix 75 mg PO daily   Mouth swelling/allergic reaction  - IV solumedrol 120 mg q12  - IV pepcid 20 mg q12  - Suspected from the oxycodone    DVT prophylaxis: Lovenox 55 mg sq daily       Subjective: Pt seen and examined at the bedside. Per nursing staff the facial swelling is improved vs yesterday. He is on IV steroids and IV pepcid. Continue to work on endurance while he is here in the hospital.   Physical Exam: Vitals:   09/02/22 2307 09/03/22 0429 09/03/22 0757 09/03/22 1215  BP: (!) 146/95 (!) 143/55 134/75 (!) 148/86  Pulse: (!) 107 87 (!) 106 99  Resp: 18 20 20 20   Temp: 98.4 F (36.9 C) 98.1 F (36.7 C) 98.8 F (37.1 C) 98.5 F (36.9 C)  TempSrc:   Oral Oral  SpO2: 98% 98% 96% 97%  Weight:      Height:       Constitutional:      Appearance: Normal appearance.  HENT:     Head: Normocephalic.     Mouth: Mucous membranes are moist.  Cardiovascular:     Rate and Rhythm: Intermittent tachycardia  Pulmonary:     Effort: Pulmonary effort is normal.  Abdominal:     Palpations: Abdomen is soft.  Musculoskeletal:     Cervical  back: Neck supple.  Skin:    General: Skin is warm.  Neurological:     Mental Status: He is alert. Mental status is at baseline.  Psychiatric:        Behavior: Behavior normal.  Data Reviewed:   Disposition: Status is: Inpatient  Planned Discharge Destination: Skilled nursing facility    Time spent: 35 minutes  Author: Lucienne Minks , MD 09/03/2022 12:42 PM  For on call review [email or web address].

## 2022-09-03 NOTE — Progress Notes (Signed)
1959: Paged on-call provider with concern that patient's angioedema was worsening. VSS. Provider to bedside. See orders.

## 2022-09-04 LAB — GLUCOSE, CAPILLARY
Glucose-Capillary: 145 mg/dL — ABNORMAL HIGH (ref 70–99)
Glucose-Capillary: 145 mg/dL — ABNORMAL HIGH (ref 70–99)
Glucose-Capillary: 158 mg/dL — ABNORMAL HIGH (ref 70–99)
Glucose-Capillary: 205 mg/dL — ABNORMAL HIGH (ref 70–99)
Glucose-Capillary: 214 mg/dL — ABNORMAL HIGH (ref 70–99)
Glucose-Capillary: 249 mg/dL — ABNORMAL HIGH (ref 70–99)

## 2022-09-04 LAB — CBC
HCT: 45.4 % (ref 39.0–52.0)
Hemoglobin: 13.5 g/dL (ref 13.0–17.0)
MCH: 29.3 pg (ref 26.0–34.0)
MCHC: 29.7 g/dL — ABNORMAL LOW (ref 30.0–36.0)
MCV: 98.5 fL (ref 80.0–100.0)
Platelets: 273 10*3/uL (ref 150–400)
RBC: 4.61 MIL/uL (ref 4.22–5.81)
RDW: 14.5 % (ref 11.5–15.5)
WBC: 20.3 10*3/uL — ABNORMAL HIGH (ref 4.0–10.5)
nRBC: 0.1 % (ref 0.0–0.2)

## 2022-09-04 LAB — COMPREHENSIVE METABOLIC PANEL
ALT: 39 U/L (ref 0–44)
AST: 38 U/L (ref 15–41)
Albumin: 2.5 g/dL — ABNORMAL LOW (ref 3.5–5.0)
Alkaline Phosphatase: 87 U/L (ref 38–126)
Anion gap: 7 (ref 5–15)
BUN: 14 mg/dL (ref 8–23)
CO2: 31 mmol/L (ref 22–32)
Calcium: 8.2 mg/dL — ABNORMAL LOW (ref 8.9–10.3)
Chloride: 100 mmol/L (ref 98–111)
Creatinine, Ser: 0.81 mg/dL (ref 0.61–1.24)
GFR, Estimated: >60 mL/min (ref >60)
Glucose, Bld: 203 mg/dL — ABNORMAL HIGH (ref 70–99)
Potassium: 4.9 mmol/L (ref 3.5–5.1)
Sodium: 138 mmol/L (ref 135–145)
Total Bilirubin: 1.1 mg/dL (ref 0.3–1.2)
Total Protein: 5.8 g/dL — ABNORMAL LOW (ref 6.5–8.1)

## 2022-09-04 LAB — AMMONIA: Ammonia: 23 umol/L (ref 9–35)

## 2022-09-04 LAB — MAGNESIUM: Magnesium: 2.2 mg/dL (ref 1.7–2.4)

## 2022-09-04 MED ORDER — EPINEPHRINE PF 1 MG/ML IJ SOLN
INTRAMUSCULAR | Status: AC
  Filled 2022-09-04: qty 1

## 2022-09-04 NOTE — TOC Progression Note (Signed)
Transition of Care [facility]) - Progression Note    Patient Details  Name: Erik [name] MRN: [id number] Date of Birth: [birth year]  [facility] CM/SW Contact  Izola Price, RN Phone Number: 09/04/2022, 2:23 PM  Clinical Narrative:  1/28 Night RN on 1/27 reported angioedema to provider. Pepcid and steroid ordered or dx of allergic reaction, improving. Working on increasing endurance per notes. Remains on 4L/[place].Simmie Davies RN CM       Expected Discharge Plan:  (TBD) Barriers to Discharge: Continued Medical Work up  Expected Discharge Plan and Services       Living arrangements for the past 2 months: Single Family Home                                       Social Determinants of Health (SDOH) Interventions SDOH Screenings   Food Insecurity: No Food Insecurity (07/14/2022)  Housing: Low Risk  (07/14/2022)  Transportation Needs: No Transportation Needs (07/14/2022)  Utilities: Not At Risk (07/14/2022)  Alcohol Screen: Low Risk  (02/19/2020)  Depression (PHQ2-9): Low Risk  (08/25/2020)  Financial Resource Strain: Low Risk  (11/04/2020)  Tobacco Use: Medium Risk (09/01/2022)    Readmission Risk Interventions     No data to display

## 2022-09-04 NOTE — Progress Notes (Signed)
PT Cancellation Note  Patient Details Name: Erik [name] MRN: [id number] DOB: Nov 29, 1955   Cancelled Treatment:    Reason Eval/Treat Not Completed: Other (comment);Patient declined, no reason specified  Attempted x 3 today.  Pt irritated this AM awaiting breakfast, returned later and he stated he needed to have a BM.  Obtained bedpan "You are rushing me, I said I needed to, not that I was ready to do it."  Returned after lunch and he again declined stated he was having a contractor come to talk about bathroom remodel and would not be available.  Pt stated he hoped to discharge home tomorrow.  Will try again in AM.    Chesley Noon 09/04/2022, 2:21 PM

## 2022-09-04 NOTE — Progress Notes (Signed)
  Progress Note   Patient: Erik [name] [id number]:[id number] DOB: 1955-10-02 DOA: 08/27/2022     8 DOS: the patient was seen and examined on 09/04/2022   Brief hospital course:  Assessment and Plan:  * Atrial fibrillation with RVR (HCC) - Lopressor 100 mg PO bid    CAP (community acquired pneumonia) - Completed antibx course    COPD (chronic obstructive pulmonary disease) (HCC) - Breo/incruse ellipta 1 puff daily  - Duoneb tid - Robitussin DM q4 hr PRN    Rhabdomyolysis - Resolved    Right sided weakness - ASA 81 mg PO daily  - Plavix 75 mg PO daily    Bilateral cellulitis of lower leg - s/p recanalization of the R lower extremity for limb salvage 08/31/2022 - Gabapentin 100 mg PO tid    Chronic systolic CHF (congestive heart failure) (HCC) - Lopressor 100 mg PO bid    CAD (coronary artery disease) - ASA 81 mg PO daily  - Plavix 75 mg PO daily    Mouth swelling/allergic reaction  - Resolved  - Suspected from the oxycodone. Spoke with pharmacy on 09/04/2022 and they will mark oxycodone as a severe allergy for this pt   DVT prophylaxis: Lovenox 55 mg sq daily       Subjective: Pt seen and examined at the bedside. Facial swelling has resolved. Steroids and pepcid have been discontinued. He continues in the hospital to work on his physical endurance, which remains a barrier to discharge.   Physical Exam: Vitals:   09/04/22 0538 09/04/22 0732 09/04/22 0819 09/04/22 1127  BP: (!) 147/90 139/85  126/83  Pulse: (!) 103 (!) 108 (!) 107 95  Resp: 20 20 20 20   Temp: 97.8 F (36.6 C) 97.7 F (36.5 C)  98 F (36.7 C)  TempSrc: Oral Oral  Oral  SpO2: 100% 100% 95% 97%  Weight:      Height:       Constitutional:      Appearance: Normal appearance.  HENT:     Head: Normocephalic.     Mouth: Mucous membranes are moist.  Cardiovascular:     Rate and Rhythm: Intermittent tachycardia  Pulmonary:     Effort: Pulmonary effort is normal.  Abdominal:     Palpations: Abdomen  is soft.  Musculoskeletal:     Cervical back: Neck supple.  Skin:    General: Skin is warm.  Neurological:     Mental Status: He is alert. Mental status is at baseline.  Psychiatric:        Behavior: Behavior normal.  Data Reviewed:   Disposition: Status is: Inpatient  Planned Discharge Destination: Home with Home Health    Time spent: 35 minutes  Author: Lucienne Minks , MD 09/04/2022 11:54 AM  For on call review [email or web address].

## 2022-09-05 ENCOUNTER — Telehealth: Payer: Self-pay | Admitting: Nurse Practitioner

## 2022-09-05 LAB — GLUCOSE, CAPILLARY
Glucose-Capillary: 118 mg/dL — ABNORMAL HIGH (ref 70–99)
Glucose-Capillary: 141 mg/dL — ABNORMAL HIGH (ref 70–99)
Glucose-Capillary: 148 mg/dL — ABNORMAL HIGH (ref 70–99)
Glucose-Capillary: 193 mg/dL — ABNORMAL HIGH (ref 70–99)
Glucose-Capillary: 85 mg/dL (ref 70–99)
Glucose-Capillary: 93 mg/dL (ref 70–99)

## 2022-09-05 LAB — COMPREHENSIVE METABOLIC PANEL
ALT: 89 U/L — ABNORMAL HIGH (ref 0–44)
AST: 77 U/L — ABNORMAL HIGH (ref 15–41)
Albumin: 2.6 g/dL — ABNORMAL LOW (ref 3.5–5.0)
Alkaline Phosphatase: 96 U/L (ref 38–126)
Anion gap: 6 (ref 5–15)
BUN: 20 mg/dL (ref 8–23)
CO2: 35 mmol/L — ABNORMAL HIGH (ref 22–32)
Calcium: 8.6 mg/dL — ABNORMAL LOW (ref 8.9–10.3)
Chloride: 100 mmol/L (ref 98–111)
Creatinine, Ser: 0.73 mg/dL (ref 0.61–1.24)
GFR, Estimated: >60 mL/min (ref >60)
Glucose, Bld: 109 mg/dL — ABNORMAL HIGH (ref 70–99)
Potassium: 4.5 mmol/L (ref 3.5–5.1)
Sodium: 141 mmol/L (ref 135–145)
Total Bilirubin: 0.9 mg/dL (ref 0.3–1.2)
Total Protein: 5.7 g/dL — ABNORMAL LOW (ref 6.5–8.1)

## 2022-09-05 LAB — CBC
HCT: 49 % (ref 39.0–52.0)
HCT: 45.5 % (ref 39.0–52.0)
Hemoglobin: 14.9 g/dL (ref 13.0–17.0)
Hemoglobin: 13.8 g/dL (ref 13.0–17.0)
MCH: 29.2 pg (ref 26.0–34.0)
MCH: 29.1 pg (ref 26.0–34.0)
MCHC: 30.4 g/dL (ref 30.0–36.0)
MCHC: 30.3 g/dL (ref 30.0–36.0)
MCV: 96.1 fL (ref 80.0–100.0)
MCV: 96 fL (ref 80.0–100.0)
Platelets: 510 10*3/uL — ABNORMAL HIGH (ref 150–400)
Platelets: 496 10*3/uL — ABNORMAL HIGH (ref 150–400)
RBC: 5.1 MIL/uL (ref 4.22–5.81)
RBC: 4.74 MIL/uL (ref 4.22–5.81)
RDW: 14.5 % (ref 11.5–15.5)
RDW: 14.5 % (ref 11.5–15.5)
WBC: 14.6 10*3/uL — ABNORMAL HIGH (ref 4.0–10.5)
WBC: 12.9 10*3/uL — ABNORMAL HIGH (ref 4.0–10.5)
nRBC: 0 % (ref 0.0–0.2)
nRBC: 0 % (ref 0.0–0.2)

## 2022-09-05 LAB — MAGNESIUM: Magnesium: 2.2 mg/dL (ref 1.7–2.4)

## 2022-09-05 MED ORDER — HYDROXYZINE HCL 25 MG PO TABS
25.0000 mg | ORAL_TABLET | Freq: Three times a day (TID) | ORAL | Status: DC | PRN
  Administered 2022-09-05 – 2022-09-06 (×3): 25 mg via ORAL
  Filled 2022-09-05 (×3): qty 1

## 2022-09-05 MED ORDER — DOCUSATE SODIUM 100 MG PO CAPS
100.0000 mg | ORAL_CAPSULE | Freq: Two times a day (BID) | ORAL | Status: DC
  Administered 2022-09-05: 100 mg via ORAL
  Filled 2022-09-05 (×6): qty 1

## 2022-09-05 MED ORDER — SENNOSIDES-DOCUSATE SODIUM 8.6-50 MG PO TABS
1.0000 | ORAL_TABLET | Freq: Every evening | ORAL | Status: DC | PRN
  Administered 2022-09-05: 1 via ORAL
  Filled 2022-09-05: qty 1

## 2022-09-05 NOTE — Progress Notes (Signed)
  Progress Note   Patient: Erik [name] SNK:[id number] DOB: Oct 14, 1955 DOA: 08/27/2022     9 DOS: the patient was seen and examined on 09/05/2022   Brief hospital course:  Assessment and Plan:  * Atrial fibrillation with RVR (HCC) - Lopressor 100 mg PO bid    CAP (community acquired pneumonia) - Completed antibx course    COPD (chronic obstructive pulmonary disease) (HCC) - Breo/incruse ellipta 1 puff daily  - Duoneb tid - Robitussin DM q4 hr PRN    Rhabdomyolysis - Resolved    Right sided weakness - ASA 81 mg PO daily  - Plavix 75 mg PO daily    Bilateral cellulitis of lower leg - s/p recanalization of the R lower extremity for limb salvage 9/37/9024   Chronic systolic CHF (congestive heart failure) (HCC) - Lopressor 100 mg PO bid    CAD (coronary artery disease) - ASA 81 mg PO daily  - Plavix 75 mg PO daily    Mouth swelling/allergic reaction  - Resolved  - Suspected from the oxycodone. Spoke with pharmacy on 09/04/2022 and they will mark oxycodone as a severe allergy for this pt   DVT prophylaxis: Lovenox 55 mg sq daily       Subjective: Pt seen and examined at the bedside. PT has advised that pt needs skilled nursing/short term rehab. I spoke with case management to work towards this goal.  Physical Exam: Vitals:   09/05/22 0733 09/05/22 0855 09/05/22 0936 09/05/22 1309  BP:  129/78  127/83  Pulse:  94  96  Resp:  20  (!) 22  Temp:  97.7 F (36.5 C)  (!) 97.5 F (36.4 C)  TempSrc:      SpO2: 100% 100% 92% 98%  Weight:      Height:       Constitutional:      Appearance: Normal appearance.  HENT:     Head: Normocephalic.     Mouth: Mucous membranes are moist.  Cardiovascular:     Rate and Rhythm: Intermittent tachycardia  Pulmonary:     Effort: Pulmonary effort is normal.  Abdominal:     Palpations: Abdomen is soft.  Musculoskeletal:     Cervical back: Neck supple.  Skin:    General: Skin is warm.  Neurological:     Mental Status: He is  alert. Mental status is at baseline.  Psychiatric:        Behavior: Behavior normal.  Data Reviewed:   Disposition: Status is: Inpatient  Planned Discharge Destination: Rehab    Time spent: 35 minutes  Author: Lucienne Minks , MD 09/05/2022 2:15 PM  For on call review [email or web address].

## 2022-09-05 NOTE — NC FL2 (Signed)
[place] LEVEL OF CARE FORM     IDENTIFICATION  Patient Name: Erik [name] Birthdate: 1955/09/09 Sex: male Admission Date (Current Location): 08/27/2022  [facility] and [place] Number:  Engineering geologist and Address:  [facility], [address] [place], [place] [address]      Provider Number: [id number]  Attending Physician Name and Address:  Lucienne Minks, MD  Relative Name and Phone Number:  Lynelle Smoke (spouse)  [phone]    Current Level of Care: Hospital Recommended Level of Care: [place] Prior Approval Number:    Date Approved/Denied:   PASRR Number: [id number]  Discharge Plan: SNF    Current Diagnoses: Patient Active Problem List   Diagnosis Date Noted   PAD (peripheral artery disease) ([place]) 09/02/2022   Atrial fibrillation with RVR ([place]) 08/27/2022   CAP (community acquired pneumonia) 08/27/2022   Rhabdomyolysis 08/27/2022   Right sided weakness 08/27/2022   Lower extremity cellulitis 07/14/2022   Leg swelling 07/13/2022   Left hip pain 01/17/2022   Ulcer of extremity due to chronic venous insufficiency ([place]) 01/14/2022   Bilateral cellulitis of lower leg 01/13/2022   Ambulatory dysfunction 01/13/2022   Bilateral lower leg cellulitis 54/56/2563   Chronic systolic CHF (congestive heart failure) ([place]) 01/13/2022   Chronic respiratory failure with hypoxia ([place]) 01/13/2022   Atherosclerosis of native arteries of extremity with intermittent claudication ([place]) 06/01/2020   Dorsalgia 09/11/2017   Occlusion and stenosis of bilateral carotid arteries 09/11/2017   Pulmonic heart disease ([place]) 09/11/2017   COPD (chronic obstructive pulmonary disease) ([place]) 08/14/2017   Chronic a-fib ([place]) 03/27/2015   CAD (coronary artery disease) 03/02/2015   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   Essential hypertension with goal blood pressure less than 130/80 02/23/2015    Orientation RESPIRATION BLADDER Height & Weight      Self, Time, Situation, Place  O2 (4L nasal cannula) Incontinent, External catheter Weight: 244 lb 4.3 oz (110.8 kg) Height:  5\' 10"  (177.8 cm)  BEHAVIORAL SYMPTOMS/MOOD NEUROLOGICAL BOWEL NUTRITION STATUS      Incontinent Diet (see discharge summary)  AMBULATORY STATUS COMMUNICATION OF NEEDS Skin   Extensive Assist Verbally Other (Comment) (abrasion left knee, lower right leg wound, venous stasis ulcer right leg)                       Personal Care Assistance Level of Assistance  Bathing, Feeding, Dressing, Total care Bathing Assistance: Maximum assistance Feeding assistance: Limited assistance Dressing Assistance: Maximum assistance Total Care Assistance: Maximum assistance   Functional Limitations Info  Sight, Hearing, Speech Sight Info: Adequate Hearing Info: Adequate Speech Info: Adequate    SPECIAL CARE FACTORS FREQUENCY  PT (By licensed PT), OT (By licensed OT)     PT Frequency: min 4x weekly OT Frequency: min 4x weekly            Contractures Contractures Info: Not present    Additional Factors Info  Code Status, Allergies Code Status Info: full Allergies Info: Benadryl (Diphenhydramine Hcl)  Morphine And Related  Oxycodone           Current Medications (09/05/2022):  This is the current hospital active medication list Current Facility-Administered Medications  Medication Dose Route Frequency Provider Last Rate Last Admin   acetaminophen (TYLENOL) tablet 650 mg  650 mg Oral Q6H PRN Schnier, Dolores Lory, MD   650 mg at 09/04/22 2022   Or   acetaminophen (TYLENOL) suppository 650 mg  650 mg Rectal Q6H PRN Hortencia Pilar  G, MD       albuterol (PROVENTIL) (2.5 MG/3ML) 0.083% nebulizer solution 2.5 mg  2.5 mg Nebulization Q4H PRN Lucienne Minks, MD   2.5 mg at 09/05/22 0309   aspirin EC tablet 81 mg  81 mg Oral Daily Schnier, Dolores Lory, MD   81 mg at 09/05/22 0915   chlorhexidine (HIBICLENS) 4 % liquid 4 Application  60 mL Topical Once Schnier, Dolores Lory, MD       clopidogrel (PLAVIX) tablet 75 mg  75 mg Oral Daily Schnier, Dolores Lory, MD   75 mg at 09/05/22 0915   docusate sodium (COLACE) capsule 100 mg  100 mg Oral BID Lucienne Minks, MD   100 mg at 09/05/22 1337   enoxaparin (LOVENOX) injection 55 mg  0.5 mg/kg Subcutaneous Q24H Schnier, Dolores Lory, MD   55 mg at 09/04/22 1757   fluticasone furoate-vilanterol (BREO ELLIPTA) 100-25 MCG/ACT 1 puff  1 puff Inhalation Daily Schnier, Dolores Lory, MD   1 puff at 09/05/22 0917   And   umeclidinium bromide (INCRUSE ELLIPTA) 62.5 MCG/ACT 1 puff  1 puff Inhalation Daily Schnier, Dolores Lory, MD   1 puff at 09/05/22 0917   guaiFENesin-dextromethorphan (ROBITUSSIN DM) 100-10 MG/5ML syrup 5 mL  5 mL Oral Q4H PRN Schnier, Dolores Lory, MD   5 mL at 08/30/22 2150   hydrOXYzine (ATARAX) tablet 25 mg  25 mg Oral TID PRN Lucienne Minks, MD   25 mg at 09/05/22 1241   insulin aspart (novoLOG) injection 0-15 Units  0-15 Units Subcutaneous Q4H Sharion Settler, NP   2 Units at 09/05/22 1337   ipratropium-albuterol (DUONEB) 0.5-2.5 (3) MG/3ML nebulizer solution 3 mL  3 mL Nebulization TID Katha Cabal, MD   3 mL at 09/05/22 1335   metoprolol tartrate (LOPRESSOR) tablet 100 mg  100 mg Oral BID Schnier, Dolores Lory, MD   100 mg at 09/05/22 0915   ondansetron (ZOFRAN) injection 4 mg  4 mg Intravenous Q6H PRN Pace, Brien R, NP       ondansetron (ZOFRAN) tablet 4 mg  4 mg Oral Q6H PRN Schnier, Dolores Lory, MD       polyethylene glycol (MIRALAX / GLYCOLAX) packet 17 g  17 g Oral Daily Schnier, Dolores Lory, MD   17 g at 09/04/22 0920   senna-docusate (Senokot-S) tablet 1 tablet  1 tablet Oral QHS PRN Lucienne Minks, MD   1 tablet at 09/05/22 1337     Discharge Medications: Please see discharge summary for a list of discharge medications.  Relevant Imaging Results:  Relevant Lab Results:   Additional Information SSN: [id number]  Tiburcio Bash, LCSW

## 2022-09-05 NOTE — Progress Notes (Signed)
OT Cancellation Note  Patient Details Name: Erik [name] MRN: [id number] DOB: Jul 23, 1956   Cancelled Treatment:    Reason Eval/Treat Not Completed: Patient declined, stating that he felt "terrible," that he had been having recurrent nosebleeds throughout the night and this AM, and that he had already "sat on the edge of the bed once today." Nursing had already been informed of nosebleeds and pt's pain. Pt requested that therapist come back tomorrow.   Josiah Lobo 09/05/2022, 2:13 PM

## 2022-09-05 NOTE — Consult Note (Addendum)
[facility] Nurse Consult Note: Reason for Consult:RLE wound and scattered wounds on LLE. My associate M. Liane Comber saw and provided guidance for wound care on 1/22. POC for daily care using xeroform (antimicrobial nonadherent) is still appropriate. I will continue those orders, decreasing frequency to every other day and PRN drainage strikethrough onto exterior of dressing and add conservative care for LLE scattered wounds using an every other day soap and water cleanse to the LLE with silicone foam dressings applied as indicated to any wound with exudate.  Wound type: PAD Pressure Injury POA: N/A See note from my associate's encounter on 1/22.  Recommend patient follow up with Vascular surgery post discharge as directed.  [facility] nursing team will not follow, but will remain available to this patient, the nursing and medical teams.  Please re-consult if needed.  Thank you for inviting [place] to participate in this patient's Plan of Care.  Maudie Flakes, MSN, RN, CNS, [place], Serita Grammes, [employer], [employer] phone:  [phone]

## 2022-09-05 NOTE — Progress Notes (Signed)
Progress Note    09/05/2022 3:59 PM 5 Days Post-Op  Subjective:  Patient notes no significant issue pain post intervention however he developed some oozing and bleeding from his groin site last Thursday night. This has resolved.   Patient states he feels weak today but his lower extremities feel much better especially with them being wrapped. No other complaints overnight. Vitals all remain stable.    Vitals:   09/05/22 0936 09/05/22 1309  BP:  127/83  Pulse:  96  Resp:  (!) 22  Temp:  (!) 97.5 F (36.4 C)  SpO2: 92% 98%   Physical Exam: Cardiac:  Irregular. Hx Afib Lungs: Bilateral Rales in middle and lower lobes  Incisions:  Right groin without hematoma or seroma.  Extremities:  Doppler PT pulses Bilaterally Abdomen:  Positive Bowel sounds, Soft, NT/ND Neurologic: AAOX3  CBC    Component Value Date/Time   WBC 14.6 (H) 09/05/2022 0550   RBC 5.10 09/05/2022 0550   HGB 14.9 09/05/2022 0550   HGB 15.1 02/19/2020 0000   HCT 49.0 09/05/2022 0550   HCT 45.6 02/19/2020 0000   PLT 510 (H) 09/05/2022 0550   PLT 345 02/19/2020 0000   MCV 96.1 09/05/2022 0550   MCV 90 02/19/2020 0000   MCV 91 07/19/2013 0417   MCH 29.2 09/05/2022 0550   MCHC 30.4 09/05/2022 0550   RDW 14.5 09/05/2022 0550   RDW 12.9 02/19/2020 0000   RDW 14.2 07/19/2013 0417   LYMPHSABS 1.4 08/27/2022 1356   LYMPHSABS 2.2 02/19/2020 0000   LYMPHSABS 2.1 07/19/2013 0417   MONOABS 2.0 (H) 08/27/2022 1356   MONOABS 1.5 (H) 07/19/2013 0417   EOSABS 0.0 08/27/2022 1356   EOSABS 0.2 02/19/2020 0000   EOSABS 0.1 07/19/2013 0417   BASOSABS 0.0 08/27/2022 1356   BASOSABS 0.0 02/19/2020 0000   BASOSABS 0.0 07/19/2013 0417    BMET    Component Value Date/Time   NA 141 09/05/2022 0550   NA 138 02/19/2020 0000   NA 135 (L) 07/19/2013 0417   K 4.5 09/05/2022 0550   K 3.9 07/19/2013 0417   CL 100 09/05/2022 0550   CL 101 07/19/2013 0417   CO2 35 (H) 09/05/2022 0550   CO2 29 07/19/2013 0417   GLUCOSE  109 (H) 09/05/2022 0550   GLUCOSE 86 07/19/2013 0417   BUN 20 09/05/2022 0550   BUN 14 02/19/2020 0000   BUN 10 07/19/2013 0417   CREATININE 0.73 09/05/2022 0550   CREATININE 0.80 07/19/2013 0417   CALCIUM 8.6 (L) 09/05/2022 0550   CALCIUM 8.4 (L) 07/19/2013 0417   GFRNONAA >60 09/05/2022 0550   GFRNONAA >60 07/19/2013 0417   GFRAA 105 02/19/2020 0000   GFRAA >60 07/19/2013 0417    INR    Component Value Date/Time   INR 1.2 08/27/2022 1356    No intake or output data in the 24 hours ending 09/05/22 1559   Assessment/Plan:  67 y.o. male is s/p right lower extremity angiogram  5 Days Post-Op   The patient accidentally scratched his incision from his recent intervention has had episodes of oozing bleeding. This has now resolved.  Otherwise the patient remains hemodynamically stable with no significant drop in hemoglobin.  He continues to have a warm well-perfused bilateral lower extremity with dopplerable pulses. Lower extremities wrapped with Coban and Kerlix for positive +4 edema.    DVT prophylaxis:  ASA 81 mg and Lovenox 55 mg SQ INJ   Navjot Loera R Frazier [facility] 09/05/2022 3:59  PM   

## 2022-09-05 NOTE — Progress Notes (Signed)
Physical Therapy Treatment Patient Details Name: LEBERT LOVERN MRN: [id number] DOB: 18-Dec-1955 Today's Date: 09/05/2022   History of Present Illness Tage L Malerba is a 67 y.o. male with medical history significant of CAD s/p PCI, HFrEF, permanent A-fib not on AC, COPD on home 2 L O2, chronic venous stasis who presents to the ED after a ground-level fall.  Night before presentation, pt stated that he felt sudden onset worsening shortness of breath, dizziness and elevated heart rate.    Patient receiving inpatient treatment for A-fib, CAP, acute on chronic respiratory failure from COPD, rhabdomyolysis, and bilateral LE cellulitis.  Pt's head CT scan negative for acute findings, though patient experiencing R side weakness.    PT Comments    Pt agreeable to session.  4 lpm and sats do remain within limits during session but SOB does increase with activity.  He transitions to EOB with heavy mod a x 1.  Steady in sitting x 15 minutes during discussion/education.  +2 called in attempt to stand.  He is unable to clear hips despite max a x 2 and elevated surface.  Stated back pain is limiting along with general weakness.  Returned to supine with max a x 1 for BLE;s and general discomfort in back.  Long discussion with pt regarding discharge plan.  He has refused SNF in the past stating he would only go home.  He does agree that home would be challenging and that his wife is not happy with him going home at the level he is as she is unable to care for him.  While he stated he does not want to go to rehab, he does now agree that it is necessary for him.  Relayed to [facility] his agreement to SNF at this time.  If pt refuses SNF he will need all available equipment available to him.  He is unable to transition to/from sitting with heavy assist, and unable to stand making a hoyer lift appropriate for OOB needs.  He did state he was ambulatory prior to admission and the hope is he will regain some mobility with  rehab.   Recommendations for follow up therapy are one component of a multi-disciplinary discharge planning process, led by the attending physician.  Recommendations may be updated based on patient status, additional functional criteria and insurance authorization.  Follow Up Recommendations  Skilled nursing-short term rehab (<3 hours/day)     Assistance Recommended at Discharge Frequent or constant Supervision/Assistance  Patient can return home with the following Two people to help with walking and/or transfers;Two people to help with bathing/dressing/bathroom;Assistance with cooking/housework;Help with stairs or ramp for entrance;Assist for transportation   Equipment Recommendations  Rolling walker (2 wheels);BSC/3in1;Wheelchair (measurements PT);Wheelchair cushion (measurements PT);Hospital bed    Recommendations for Other Services       Precautions / Restrictions Precautions Precautions: Fall Precaution Comments: watch O2, HR Restrictions Weight Bearing Restrictions: No     Mobility  Bed Mobility Overal bed mobility: Needs Assistance Bed Mobility: Supine to Sit, Sit to Supine     Supine to sit: Mod assist Sit to supine: Mod assist, Max assist        Transfers Overall transfer level: Needs assistance Equipment used: Rolling walker (2 wheels) Transfers: Sit to/from Stand Sit to Stand: Max assist, Total assist, +2 physical assistance, From elevated surface           General transfer comment: unable    Ambulation/Gait  Stairs             Wheelchair Mobility    Modified Rankin (Stroke Patients Only)       Balance Overall balance assessment: Needs assistance Sitting-balance support: Bilateral upper extremity supported Sitting balance-Leahy Scale: Fair     Standing balance support: Bilateral upper extremity supported Standing balance-Leahy Scale: Zero Standing balance comment: unable to stand due to pain and weakness                             Cognition Arousal/Alertness: Awake/alert Behavior During Therapy: WFL for tasks assessed/performed Overall Cognitive Status: Within Functional Limits for tasks assessed                                          Exercises      General Comments        Pertinent Vitals/Pain Pain Assessment Pain Assessment: Faces Faces Pain Scale: Hurts whole lot Pain Location: back Pain Descriptors / Indicators: Moaning, Grimacing, Guarding Pain Intervention(s): Limited activity within patient's tolerance, Monitored during session, Repositioned    Home Living                          Prior Function            PT Goals (current goals can now be found in the care plan section) Progress towards PT goals: Progressing toward goals    Frequency    Min 2X/week      PT Plan Current plan remains appropriate    Co-evaluation              AM-PAC PT "6 Clicks" Mobility   Outcome Measure  Help needed turning from your back to your side while in a flat bed without using bedrails?: A Lot Help needed moving from lying on your back to sitting on the side of a flat bed without using bedrails?: Total Help needed moving to and from a bed to a chair (including a wheelchair)?: Total Help needed standing up from a chair using your arms (e.g., wheelchair or bedside chair)?: Total Help needed to walk in hospital room?: Total Help needed climbing 3-5 steps with a railing? : Total 6 Click Score: 7    End of Session Equipment Utilized During Treatment: Gait belt;Oxygen Activity Tolerance: Patient limited by fatigue;Patient limited by pain Patient left: in bed;with call bell/phone within reach;with bed alarm set Nurse Communication: Mobility status PT Visit Diagnosis: Muscle weakness (generalized) (M62.81);Pain     Time: 9163-8466 PT Time Calculation (min) (ACUTE ONLY): 24 min  Charges:  $Therapeutic Activity: 23-37 mins                    Chesley Noon, PTA 09/05/22, 11:54 AM

## 2022-09-05 NOTE — Telephone Encounter (Signed)
Received order 08/26/22 from [facility]. Gave to [place] for signature.

## 2022-09-05 NOTE — Progress Notes (Signed)
CROSS COVER NOTE  NAME: Erik [name] MRN: [id number] DOB : 1955-10-30 ATTENDING PHYSICIAN: Lucienne Minks, MD    Date of Service   09/05/2022   HPI/Events of Note   Notified of 23 beat run of monomorphic NSVT. RN reports patient was asymptomatic. BP 119/78. AM labs pending. Recent ECHO reviewed.   Interventions    Assessment/Plan: Replete electrolytes PRN       To reach the provider On-Call:   7AM- 7PM see care teams to locate the attending and reach out to them via [email or web address] Password: [email or web address] 7PM-7AM contact night-coverage If you still have difficulty reaching the appropriate provider, please page the [facility] (Director on Call) for [facility] on amion for assistance  This document was prepared using Systems analyst and may include unintentional dictation errors.  Neomia Glass DNP, MBA, FNP-BC, PMHNP-BC Nurse Practitioner [facility] Pager [phone]

## 2022-09-05 NOTE — Progress Notes (Signed)
RN notified that patient had 23-beat run of [place]. VSS. Patient asymptomatic. On-call provider notified. Morning labs including BMP and Mag are pending. Will await results.

## 2022-09-06 LAB — CBC
HCT: 43.1 % (ref 39.0–52.0)
Hemoglobin: 13 g/dL (ref 13.0–17.0)
MCH: 28.8 pg (ref 26.0–34.0)
MCHC: 30.2 g/dL (ref 30.0–36.0)
MCV: 95.4 fL (ref 80.0–100.0)
Platelets: 453 10*3/uL — ABNORMAL HIGH (ref 150–400)
RBC: 4.52 MIL/uL (ref 4.22–5.81)
RDW: 14.4 % (ref 11.5–15.5)
WBC: 11.3 10*3/uL — ABNORMAL HIGH (ref 4.0–10.5)
nRBC: 0 % (ref 0.0–0.2)

## 2022-09-06 LAB — COMPREHENSIVE METABOLIC PANEL
ALT: 70 U/L — ABNORMAL HIGH (ref 0–44)
AST: 46 U/L — ABNORMAL HIGH (ref 15–41)
Albumin: 2.5 g/dL — ABNORMAL LOW (ref 3.5–5.0)
Alkaline Phosphatase: 86 U/L (ref 38–126)
Anion gap: 4 — ABNORMAL LOW (ref 5–15)
BUN: 21 mg/dL (ref 8–23)
CO2: 35 mmol/L — ABNORMAL HIGH (ref 22–32)
Calcium: 7.7 mg/dL — ABNORMAL LOW (ref 8.9–10.3)
Chloride: 98 mmol/L (ref 98–111)
Creatinine, Ser: 0.71 mg/dL (ref 0.61–1.24)
GFR, Estimated: >60 mL/min (ref >60)
Glucose, Bld: 145 mg/dL — ABNORMAL HIGH (ref 70–99)
Potassium: 4.1 mmol/L (ref 3.5–5.1)
Sodium: 137 mmol/L (ref 135–145)
Total Bilirubin: 0.6 mg/dL (ref 0.3–1.2)
Total Protein: 5 g/dL — ABNORMAL LOW (ref 6.5–8.1)

## 2022-09-06 LAB — GLUCOSE, CAPILLARY
Glucose-Capillary: 126 mg/dL — ABNORMAL HIGH (ref 70–99)
Glucose-Capillary: 91 mg/dL (ref 70–99)

## 2022-09-06 LAB — MAGNESIUM: Magnesium: 2.1 mg/dL (ref 1.7–2.4)

## 2022-09-06 MED ORDER — CEPHALEXIN 500 MG PO CAPS: 500.0000 mg | ORAL_CAPSULE | Freq: Three times a day (TID) | ORAL | Status: DC

## 2022-09-06 MED ORDER — MELATONIN 5 MG PO TABS
5.0000 mg | ORAL_TABLET | Freq: Once | ORAL | Status: AC
  Administered 2022-09-06: 5 mg via ORAL
  Filled 2022-09-06: qty 1

## 2022-09-06 MED ORDER — OXYMETAZOLINE HCL 0.05 % NA SOLN
1.0000 | Freq: Two times a day (BID) | NASAL | Status: DC
  Administered 2022-09-06 (×2): 1 via NASAL
  Filled 2022-09-06: qty 15

## 2022-09-06 MED ORDER — CEPHALEXIN 500 MG PO CAPS: 500.0000 mg | ORAL_CAPSULE | Freq: Two times a day (BID) | ORAL | Status: DC

## 2022-09-06 MED ORDER — CEPHALEXIN 500 MG PO CAPS
500.0000 mg | ORAL_CAPSULE | Freq: Four times a day (QID) | ORAL | Status: DC
  Administered 2022-09-06 – 2022-09-08 (×7): 500 mg via ORAL
  Filled 2022-09-06 (×7): qty 1

## 2022-09-06 MED ORDER — GENTAMICIN SULFATE 0.1 % EX OINT
TOPICAL_OINTMENT | Freq: Three times a day (TID) | CUTANEOUS | Status: DC
  Filled 2022-09-06: qty 15

## 2022-09-06 NOTE — Care Management Important Message (Signed)
Important Message  Patient Details  Name: Erik [name] MRN: [id number] Date of Birth: 12-29-1955   Medicare Important Message Given:  Yes     [name] 09/06/2022, 9:38 AM

## 2022-09-06 NOTE — Progress Notes (Signed)
Occupational Therapy Treatment Patient Details Name: Erik [name] MRN: [id number] DOB: Feb 25, 1956 Today's Date: 09/06/2022   History of present illness Erik [name] is a 67 y.o. male with medical history significant of CAD s/p PCI, HFrEF, permanent A-fib not on AC, COPD on home 2 L O2, chronic venous stasis who presents to the ED after a ground-level fall.  Night before presentation, pt stated that he felt sudden onset worsening shortness of breath, dizziness and elevated heart rate.    Patient receiving inpatient treatment for A-fib, CAP, acute on chronic respiratory failure from COPD, rhabdomyolysis, and bilateral LE cellulitis.  Pt's head CT scan negative for acute findings, though patient experiencing R side weakness.   OT comments  Pt seen for OT session this date.  Pt finishing breakfast upon OT arrival in supine with HOB elevated.  Pt was agreeable to move to dangle EOB to finish coffee and muffin.  Pt tolerated sitting up 25 min though required increase in 02 from 3L to 4L to maintain between 87-90%, HR in the 90s while seated EOB.  Pt continues to be frustrated with ongoing nose and mouth bleed, but did participate in ongoing hand hygiene and washing face with set up throughout session.  In prep for returning to supine, pt demonstrated good ability to scoot from middle of bed to [facility] with supv, multiple scoot attempts required, but good clearance of buttocks with each scoot.  Back pain and SOB contributed to increased rest breaks between all transitions.  CNA in to assist with changing linens and peri care following return to supine d/t BI with scooting attempts.  SNF continues to be necessary at d/c d/t cardiopulmonary limitations and generalized weakness.  Left in supine with HOB elevated and all necessary items placed within reach.  Will continue to follow in the acute setting to maximize return to PLOF.   Recommendations for follow up therapy are one component of a multi-disciplinary  discharge planning process, led by the attending physician.  Recommendations may be updated based on patient status, additional functional criteria and insurance authorization.    Follow Up Recommendations  Skilled nursing-short term rehab (<3 hours/day)     Assistance Recommended at Discharge    Patient can return home with the following  Two people to help with walking and/or transfers;A lot of help with bathing/dressing/bathroom;Assistance with cooking/housework;Assist for transportation;Help with stairs or ramp for entrance   Equipment Recommendations  Tub/shower bench    Recommendations for Other Services      Precautions / Restrictions Precautions Precautions: Fall Precaution Comments: watch O2, HR Restrictions Weight Bearing Restrictions: No       Mobility Bed Mobility Overal bed mobility: Needs Assistance Bed Mobility: Supine to Sit, Sit to Supine Rolling: Mod assist   Supine to sit: Mod assist Sit to supine: Max assist   General bed mobility comments: Pt was able to sit EOB and perform multiple scoots toward [facility] with extra time, supv, and rest breaks in prep for return to supine. Patient Response: Cooperative  Transfers                   General transfer comment: Not attempted this date     Balance Overall balance assessment: Needs assistance Sitting-balance support: Feet supported, Single extremity supported Sitting balance-Leahy Scale: Fair Sitting balance - Comments: Pt sat up for 25 min on EOB, single arm support, feet supported, close supv for grooming tasks and self feeding       Standing balance comment: Not  attempted this date d/t weakness/02 sats low (high 80s to 90%) just sitting EOB despite increase from 3L to 4L with activity                           ADL either performed or assessed with clinical judgement   ADL Overall ADL's : Needs assistance/impaired Eating/Feeding: Set up;Sitting Eating/Feeding Details (indicate cue  type and reason): Pt took bites of his muffin and sipped some coffee seated EOB Grooming: Wash/dry hands;Wash/dry face;Set up;Sitting Grooming Details (indicate cue type and reason): single handed support for sitting balance                     Toileting- Clothing Manipulation and Hygiene: Total assistance Toileting - Clothing Manipulation Details (indicate cue type and reason): Pt had some BI while scooting from middle to [facility]; CNA assisted with pericare and linen change       General ADL Comments: Pt had eaten most of his breakfast in supine wiht HOB elevated upon OT arrival, but was agreeable to try sitting EOB for remainder of breakfast.  Pt sat up for 25 min on EOB, single arm support, feet supported, close supv.    Extremity/Trunk Assessment Upper Extremity Assessment Upper Extremity Assessment: Generalized weakness   Lower Extremity Assessment Lower Extremity Assessment: Generalized weakness        Vision Baseline Vision/History: 1 Wears glasses Patient Visual Report: No change from baseline Vision Assessment?: No apparent visual deficits              Cognition Arousal/Alertness: Awake/alert Behavior During Therapy: WFL for tasks assessed/performed Overall Cognitive Status: Within Functional Limits for tasks assessed                                 General Comments: Cooperative but pt frustrated with his ongoing bloody nose and mouth        Exercises Other Exercises Other Exercises: Reviewed PLB when SOB; fair return demo.  Reviewed d/c rec for SNF.  Pt reports he really wants to go home.  Not currently appropriate for home.    Shoulder Instructions       General Comments HR ranging 85-90 at rest and <110 with activity.  02 sats 90-92% on 3L, but required increase to 4L with activity d/t desat to low 80s.  Once on 4L, sats ranged from 87-90%.    Pertinent Vitals/ Pain       Pain Assessment Faces Pain Scale: Hurts even more Pain  Location: back Pain Descriptors / Indicators: Moaning, Grimacing, Guarding Pain Intervention(s): Limited activity within patient's tolerance, Monitored during session, Repositioned  Home Living                                          Prior Functioning/Environment              Frequency  Min 2X/week        Progress Toward Goals  OT Goals(current goals can now be found in the care plan section)  Progress towards OT goals: Progressing toward goals  Acute Rehab OT Goals Patient Stated Goal: to return home OT Goal Formulation: With patient Time For Goal Achievement: 09/12/22 Potential to Achieve Goals: [place] Discharge plan remains appropriate;Frequency remains appropriate  AM-PAC OT "6 Clicks" Daily Activity     Outcome Measure   Help from another person eating meals?: None Help from another person taking care of personal grooming?: A Little Help from another person toileting, which includes using toliet, bedpan, or urinal?: Total Help from another person bathing (including washing, rinsing, drying)?: A Lot Help from another person to put on and taking off regular upper body clothing?: A Lot Help from another person to put on and taking off regular lower body clothing?: Total 6 Click Score: 13    End of Session Equipment Utilized During Treatment: Oxygen  OT Visit Diagnosis: Other abnormalities of gait and mobility (R26.89);Muscle weakness (generalized) (M62.81)   Activity Tolerance Patient tolerated treatment well   Patient Left with call bell/phone within reach;in bed;Other (comment) (2 bed rails left up as they were when OT arrived)   Nurse Communication Other (comment) (CNA notified of 02 increased to 4L to maintain sats at 90% with activity; and assisted with clean up after BI)        Time: 0920-1008 OT Time Calculation (min): 48 min  Charges: OT General Charges $OT Visit: 1 Visit OT Treatments $Self  Care/Home Management : 38-52 mins  Leta Speller, MS, OTR/L   Darleene Cleaver 09/06/2022, 4:31 PM

## 2022-09-06 NOTE — TOC Progression Note (Signed)
Transition of Care [facility]) - Progression Note    Patient Details  Name: AAMARI STRAWDERMAN MRN: [id number] Date of Birth: 30-Nov-1955  Transition of Care [facility]) CM/SW [place], [place] Phone Number: 09/06/2022, 12:07 PM  Clinical Narrative:     CSW met with patient at bedside, patient on phone with spouse. Bed offers provided to both patient and spouse, Chose [facility]. [name] at [facility] updated, insurance auth started. Pending HTA auth for [facility] at this time.   Expected Discharge Plan:  (TBD) Barriers to Discharge: Continued Medical Work up  Expected Discharge Plan and Services       Living arrangements for the past 2 months: Single Family Home                                       Social Determinants of Health (SDOH) Interventions SDOH Screenings   Food Insecurity: No Food Insecurity (07/14/2022)  Housing: Low Risk  (07/14/2022)  Transportation Needs: No Transportation Needs (07/14/2022)  Utilities: Not At Risk (07/14/2022)  Alcohol Screen: Low Risk  (02/19/2020)  Depression (PHQ2-9): Low Risk  (08/25/2020)  Financial Resource Strain: Low Risk  (11/04/2020)  Tobacco Use: Medium Risk (09/01/2022)    Readmission Risk Interventions     No data to display

## 2022-09-06 NOTE — Progress Notes (Signed)
  Progress Note   Patient: Erik [name]:[id number] DOB: January 06, 1956 DOA: 08/27/2022     10 DOS: the patient was seen and examined on 09/06/2022   Brief hospital course:  Assessment and Plan: * Atrial fibrillation with RVR (HCC) - Lopressor 100 mg PO bid    CAP (community acquired pneumonia) - Completed antibx course    COPD (chronic obstructive pulmonary disease) (HCC) - Breo/incruse ellipta 1 puff daily  - Duoneb tid - Robitussin DM q4 hr PRN    Rhabdomyolysis - Resolved    Right sided weakness - ASA 81 mg PO daily  - Plavix 75 mg PO daily    Bilateral cellulitis of lower leg - s/p recanalization of the R lower extremity for limb salvage 03/18/9146   Chronic systolic CHF (congestive heart failure) (HCC) - Lopressor 100 mg PO bid    CAD (coronary artery disease) - ASA 81 mg PO daily  - Plavix 75 mg PO daily    Mouth swelling/allergic reaction  - Resolved  - Suspected from the oxycodone. Spoke with pharmacy on 09/04/2022 and they will mark oxycodone as a severe allergy for this pt  Nosebleed - ENT consulted 09/06/2022 (Dr. Tami Ribas) - Nose clamp applied and afrin spray 1 spray bid   - Hgb remains stable at 13 - ENT placed sponge in the R nostril --> to be removed by ENT in 5 days  - Keflex 500 mg PO q6hr for 7 days    DVT prophylaxis: Lovenox stopped due to nose bleed. SCD's ordered     Subjective: Pt seen and examined at the bedside. Case management is working on disposition.  Pt has developed a nose bleed that has been persistent. ENT consulted 09/06/2022. Nose clamp and afrin spray employed. Hgb stable. Awaiting ENT assessment.   UPDATE: ENT placed sponge in the R nose. Sponge to be removed by ENT in 5 days. Dr. Tami Ribas advised keflex antibx's due to sponge being in the nose.   Physical Exam: Vitals:   09/06/22 0432 09/06/22 0500 09/06/22 0826 09/06/22 1257  BP:  127/88 (!) 133/91 131/88  Pulse:  94 (!) 108 72  Resp:  18 20 19   Temp:  97.7 F (36.5 C)  97.9 F (36.6 C) 97.7 F (36.5 C)  TempSrc:  Oral Oral Oral  SpO2: 100% 99% 97% 96%  Weight:      Height:       Constitutional:      Appearance: Normal appearance.  HENT:     Head: Normocephalic.     Mouth: Mucous membranes are moist.  Cardiovascular:     Rate and Rhythm: RRR Pulmonary:     Effort: Pulmonary effort is normal.  Abdominal:     Palpations: Abdomen is soft.  Musculoskeletal:     Cervical back: Neck supple.  Skin:    General: Skin is warm.  Neurological:     Mental Status: He is alert. Mental status is at baseline.  Psychiatric:        Behavior: Behavior normal.  Data Reviewed:   Disposition: Status is: Inpatient  Planned Discharge Destination: Rehab    Time spent: 35 minutes  Author: Lucienne Minks , MD 09/06/2022 2:46 PM  For on call review [email or web address].

## 2022-09-06 NOTE — Progress Notes (Signed)
PT Cancellation Note  Patient Details Name: Erik [name] MRN: [id number] DOB: 1955/10/27   Cancelled Treatment:    Reason Eval/Treat Not Completed: Fatigue/lethargy limiting ability to participate  Pt stated he sat EOB with OT earlier and is fatigued.  Will retry at a later time/date.   Chesley Noon 09/06/2022, 12:01 PM

## 2022-09-06 NOTE — Progress Notes (Signed)
Progress Note    09/06/2022 9:25 AM 6 Days Post-Op  Subjective:  Patient notes no significant issue pain post intervention however he developed some oozing and bleeding from his nose last night. Primary team ordered humidified oxygen and some afrin spray. Patient states he still feels overall weak and didn't sleep all night from the nose bleeds. No other complaints and vitals all remain stable.    Vitals:   09/06/22 0500 09/06/22 0826  BP: 127/88 (!) 133/91  Pulse: 94 (!) 108  Resp: 18 20  Temp: 97.7 F (36.5 C) 97.9 F (36.6 C)  SpO2: 99% 97%   Physical Exam: Cardiac:   Irregular. Hx Afib  Lungs:  Bilateral Rales in middle and lower lobes  Incisions:  Right groin without hematoma or seroma.   Extremities:  Doppler PT pulses Bilaterally. Extremities warm to touch Abdomen:  Positive Bowel sounds, Soft, NT/ND  Neurologic: AAOX3   CBC    Component Value Date/Time   WBC 11.3 (H) 09/06/2022 0647   RBC 4.52 09/06/2022 0647   HGB 13.0 09/06/2022 0647   HGB 15.1 02/19/2020 0000   HCT 43.1 09/06/2022 0647   HCT 45.6 02/19/2020 0000   PLT 453 (H) 09/06/2022 0647   PLT 345 02/19/2020 0000   MCV 95.4 09/06/2022 0647   MCV 90 02/19/2020 0000   MCV 91 07/19/2013 0417   MCH 28.8 09/06/2022 0647   MCHC 30.2 09/06/2022 0647   RDW 14.4 09/06/2022 0647   RDW 12.9 02/19/2020 0000   RDW 14.2 07/19/2013 0417   LYMPHSABS 1.4 08/27/2022 1356   LYMPHSABS 2.2 02/19/2020 0000   LYMPHSABS 2.1 07/19/2013 0417   MONOABS 2.0 (H) 08/27/2022 1356   MONOABS 1.5 (H) 07/19/2013 0417   EOSABS 0.0 08/27/2022 1356   EOSABS 0.2 02/19/2020 0000   EOSABS 0.1 07/19/2013 0417   BASOSABS 0.0 08/27/2022 1356   BASOSABS 0.0 02/19/2020 0000   BASOSABS 0.0 07/19/2013 0417    BMET    Component Value Date/Time   NA 137 09/06/2022 0647   NA 138 02/19/2020 0000   NA 135 (L) 07/19/2013 0417   K 4.1 09/06/2022 0647   K 3.9 07/19/2013 0417   CL 98 09/06/2022 0647   CL 101 07/19/2013 0417   CO2 35 (H)  09/06/2022 0647   CO2 29 07/19/2013 0417   GLUCOSE 145 (H) 09/06/2022 0647   GLUCOSE 86 07/19/2013 0417   BUN 21 09/06/2022 0647   BUN 14 02/19/2020 0000   BUN 10 07/19/2013 0417   CREATININE 0.71 09/06/2022 0647   CREATININE 0.80 07/19/2013 0417   CALCIUM 7.7 (L) 09/06/2022 0647   CALCIUM 8.4 (L) 07/19/2013 0417   GFRNONAA >60 09/06/2022 0647   GFRNONAA >60 07/19/2013 0417   GFRAA 105 02/19/2020 0000   GFRAA >60 07/19/2013 0417    INR    Component Value Date/Time   INR 1.2 08/27/2022 1356     Intake/Output Summary (Last 24 hours) at 09/06/2022 0925 Last data filed at 09/05/2022 1603 Gross per 24 hour  Intake 480 ml  Output 500 ml  Net -20 ml     Assessment/Plan:  67 y.o. male is s/p right lower extremity angiogram   6 Days Post-Op   The patient accidentally scratched his incision from his recent intervention has had episodes of oozing bleeding. This has now resolved.  Otherwise the patient remains hemodynamically stable with no significant drop in hemoglobin.  He continues to have a warm well-perfused bilateral lower extremity with dopplerable pulses. Lower extremities  wrapped with Coban and Kerlix for positive +4 edema.  Nose bleeds noted last night. Primary team aware. Patient placed on humidified oxygen and ordered afrin to nares today.    DVT prophylaxis:  ASA 81 mg and Lovenox 55 mg SQ INJ   Shireen Rayburn R Djimon [facility] 09/06/2022 9:25 AM

## 2022-09-06 NOTE — Progress Notes (Signed)
CROSS COVER NOTE  NAME: Erik [name] MRN: [id number] DOB : [birth year] ATTENDING PHYSICIAN: Lucienne Minks, MD    Date of Service   09/06/2022   HPI/Events of Note   Medication request received for epistaxis. RN reports bleed stops with holding pressure however patient is disturbing the clot and nosebleed has recurred. BP 136/85.  Interventions   Assessment/Plan:  Afrin PRN      To reach the provider On-Call:   7AM- 7PM see care teams to locate the attending and reach out to them via [email or web address] Password: [email or web address] 7PM-7AM contact night-coverage If you still have difficulty reaching the appropriate provider, please page the [facility] (Director on Call) for [facility] on amion for assistance  This document was prepared using Systems analyst and may include unintentional dictation errors.  Neomia Glass DNP, MBA, FNP-BC, PMHNP-BC Nurse Practitioner [facility] Pager [phone]

## 2022-09-06 NOTE — Consult Note (Signed)
Erik [name], [place] [id number] [birth year]  Lucienne Minks, MD  Reason for Consult: Epistaxis  HPI: Patient recently admitted long medical history including congestive heart failure atrial fibrillation.  According to patient and nursing staff had a recently placed left artery stent.  Since that placement he has been on anticoagulation and apparently was on Coumadin at home for his atrial fibrillation.  At that time he has had intermittent bleeding off and on from the right nostril.  Is on nasal cannula oxygen.  Allergies:  Allergies  Allergen Reactions   Benadryl [Diphenhydramine Hcl] Shortness Of Breath   Morphine And Related Swelling    Difficulty breathing per patient    Oxycodone Swelling    Severe mouth swelling requiring medical intervention    ROS: Review of systems normal other than 12 systems except per HPI.  PMH:  Past Medical History:  Diagnosis Date   Arthritis    Asthma    Atrial fibrillation (HCC)    CHF (congestive heart failure) (HCC)    COPD (chronic obstructive pulmonary disease) (HCC)    Coronary artery disease    GERD (gastroesophageal reflux disease)    Hypertension    Myocardial infarction (HCC)    Shortness of breath dyspnea     FH:  Family History  Problem Relation Age of Onset   Coronary artery disease Father    Diabetes Father    Hyperlipidemia Father    Hypertension Father    Stroke Mother    Hyperlipidemia Mother     SH:  Social History   Socioeconomic History   Marital status: Married    Spouse name: Not on file   Number of children: Not on file   Years of education: Not on file   Highest education level: Not on file  Occupational History   Not on file  Tobacco Use   Smoking status: Former    Years: 25.00    Types: Cigarettes    Quit date: 03/20/1997    Years since quitting: 25.4   Smokeless tobacco: Never  Vaping Use   Vaping Use: Never used  Substance and Sexual Activity   Alcohol use: No   Drug use: No   Sexual activity: Not  on file  Other Topics Concern   Not on file  Social History Narrative   Not on file   Social Determinants of Health   Financial Resource Strain: Low Risk  (11/04/2020)   Overall Financial Resource Strain (CARDIA)    Difficulty of Paying Living Expenses: Not hard at all  Food Insecurity: No Food Insecurity (07/14/2022)   Hunger Vital Sign    Worried About Running Out of Food in the Last Year: Never true    Ran Out of Food in the Last Year: Never true  Transportation Needs: No Transportation Needs (07/14/2022)   PRAPARE - Hydrologist (Medical): No    Lack of Transportation (Non-Medical): No  Physical Activity: Not on file  Stress: Not on file  Social Connections: Not on file  Intimate Partner Violence: Not At Risk (07/14/2022)   Humiliation, Afraid, Rape, and Kick questionnaire    Fear of Current or Ex-Partner: No    Emotionally Abused: No    Physically Abused: No    Sexually Abused: No    PSH:  Past Surgical History:  Procedure Laterality Date   ANGIOPLASTY  2009   CARDIAC CATHETERIZATION N/A 03/18/2015   Procedure: Left Heart Cath with Coronary Angiography;  Surgeon: Corey Skains, MD;  Location: [facility]  INVASIVE CV LAB;  Service: Cardiovascular;  Laterality: N/A;   CHOLECYSTECTOMY  2005   CORONARY ANGIOGRAM  2009   LOWER EXTREMITY ANGIOGRAPHY Right 08/31/2022   Procedure: Lower Extremity Angiography;  Surgeon: Katha Cabal, MD;  Location: [facility] CV LAB;  Service: Cardiovascular;  Laterality: Right;    Physical  Exam: Patient is awake and alert answering questions appropriately in bed.  He has some clotting in the right nostril but no active bleeding.  After his consent topical anesthetic phenylephrine lidocaine solution was placed on a cottonoid pledget in the right nostril.  This was left approximately 6 minutes.  After this was removed.  1 small bleeding area from the anterior septum this was cauterized using silver nitrate.   Significant edema and erythema around this area I felt that a Merocel sponge would be warranted as he will continue to be on anticoagulation with a fresh stent.  Therefore an AP Merocel sponge was impregnated with bacitracin ointment and slid carefully into the right nostril.  This was then bandaged using oxymetazoline.   A/P: Epistaxis resulting from severe dry nose from nasal oxygen and anticoagulation.  This pack will need to remain in position for approximately 5 to 6 days.  The apparent plan is to discharge him to rehab, he will need to be on oral Keflex while the sponges in position.  He can be discharged home on Keflex and can follow-up with [place] as an outpatient next Monday.  If he is discharged to a rehab facility if our office can be alerted to where he goes we can try to arrange transportation for him as an outpatient.  If he remains in the hospital we can remove the packing in the hospital.  He can remain on nasal cannula however he will not be getting any oxygen through the right nostril.   Roena Malady 09/06/2022 4:57 PM

## 2022-09-06 NOTE — Progress Notes (Signed)
Pt noted with intermittent nose bleed since previous shift, no active bleeding noted. However upon entrance into pt's room or when called, pt noted with blood dried in right nostril with kleenex tissues with blood on them. Small clots noted in right nare, pt denies picking nose, however staff reports observing pt with finger in nose. No source of bleeding noted upon examination. Pressure applied by this nurse, bleeding stopped. However pt calls this nurse back into room approximately one hour later with blood noted on his gown. Neomia Glass, NP made aware, new order noted for afrin spray.

## 2022-09-06 NOTE — Progress Notes (Signed)
CROSS COVER NOTE  NAME: Erik [name] MRN: [id number] DOB : 09-06-1955 ATTENDING PHYSICIAN: Lucienne Minks, MD    Date of Service   09/06/2022   HPI/Events of Note   Medication request received for sleep aid.  Interventions   Assessment/Plan:  Melatonin      To reach the provider On-Call:   7AM- 7PM see care teams to locate the attending and reach out to them via [email or web address] Password: [email or web address] 7PM-7AM contact night-coverage If you still have difficulty reaching the appropriate provider, please page the [facility] (Director on Call) for [facility] on amion for assistance  This document was prepared using Systems analyst and may include unintentional dictation errors.  Neomia Glass DNP, MBA, FNP-BC, PMHNP-BC Nurse Practitioner [facility] Pager [phone]

## 2022-09-07 ENCOUNTER — Telehealth: Payer: Self-pay | Admitting: Nurse Practitioner

## 2022-09-07 DIAGNOSIS — J9621 Acute and chronic respiratory failure with hypoxia: Secondary | ICD-10-CM | POA: Diagnosis not present

## 2022-09-07 DIAGNOSIS — E669 Obesity, unspecified: Secondary | ICD-10-CM | POA: Diagnosis not present

## 2022-09-07 DIAGNOSIS — I4891 Unspecified atrial fibrillation: Secondary | ICD-10-CM | POA: Diagnosis not present

## 2022-09-07 DIAGNOSIS — I5023 Acute on chronic systolic (congestive) heart failure: Secondary | ICD-10-CM

## 2022-09-07 DIAGNOSIS — R040 Epistaxis: Secondary | ICD-10-CM | POA: Diagnosis present

## 2022-09-07 LAB — COMPREHENSIVE METABOLIC PANEL
ALT: 57 U/L — ABNORMAL HIGH (ref 0–44)
AST: 34 U/L (ref 15–41)
Albumin: 2.6 g/dL — ABNORMAL LOW (ref 3.5–5.0)
Alkaline Phosphatase: 91 U/L (ref 38–126)
Anion gap: 6 (ref 5–15)
BUN: 19 mg/dL (ref 8–23)
CO2: 32 mmol/L (ref 22–32)
Calcium: 8 mg/dL — ABNORMAL LOW (ref 8.9–10.3)
Chloride: 99 mmol/L (ref 98–111)
Creatinine, Ser: 0.71 mg/dL (ref 0.61–1.24)
GFR, Estimated: >60 mL/min (ref >60)
Glucose, Bld: 134 mg/dL — ABNORMAL HIGH (ref 70–99)
Potassium: 4.4 mmol/L (ref 3.5–5.1)
Sodium: 137 mmol/L (ref 135–145)
Total Bilirubin: 0.7 mg/dL (ref 0.3–1.2)
Total Protein: 5.2 g/dL — ABNORMAL LOW (ref 6.5–8.1)

## 2022-09-07 LAB — CBC
HCT: 41.2 % (ref 39.0–52.0)
Hemoglobin: 12.8 g/dL — ABNORMAL LOW (ref 13.0–17.0)
MCH: 29.2 pg (ref 26.0–34.0)
MCHC: 31.1 g/dL (ref 30.0–36.0)
MCV: 93.8 fL (ref 80.0–100.0)
Platelets: 466 10*3/uL — ABNORMAL HIGH (ref 150–400)
RBC: 4.39 MIL/uL (ref 4.22–5.81)
RDW: 14.3 % (ref 11.5–15.5)
WBC: 12.1 10*3/uL — ABNORMAL HIGH (ref 4.0–10.5)
nRBC: 0 % (ref 0.0–0.2)

## 2022-09-07 LAB — MAGNESIUM: Magnesium: 2.1 mg/dL (ref 1.7–2.4)

## 2022-09-07 LAB — BRAIN NATRIURETIC PEPTIDE: B Natriuretic Peptide: 918.3 pg/mL — ABNORMAL HIGH (ref 0.0–100.0)

## 2022-09-07 MED ORDER — CEPHALEXIN 500 MG PO CAPS: 500.0000 mg | ORAL_CAPSULE | Freq: Four times a day (QID) | ORAL | Status: AC

## 2022-09-07 MED ORDER — ACETAMINOPHEN 325 MG PO TABS: 650.0000 mg | ORAL_TABLET | Freq: Four times a day (QID) | ORAL | Status: DC | PRN

## 2022-09-07 MED ORDER — CLOPIDOGREL BISULFATE 75 MG PO TABS: 75.0000 mg | ORAL_TABLET | Freq: Every day | ORAL | 1 refills | Status: DC

## 2022-09-07 MED ORDER — POLYETHYLENE GLYCOL 3350 17 G PO PACK: 17.0000 g | PACK | Freq: Every day | ORAL | 0 refills | Status: DC

## 2022-09-07 MED ORDER — FUROSEMIDE 10 MG/ML IJ SOLN
20.0000 mg | Freq: Two times a day (BID) | INTRAMUSCULAR | Status: DC
  Administered 2022-09-07 – 2022-09-08 (×2): 20 mg via INTRAVENOUS
  Filled 2022-09-07 (×2): qty 2

## 2022-09-07 MED ORDER — OXYMETAZOLINE HCL 0.05 % NA SOLN: 1.0000 | Freq: Two times a day (BID) | NASAL | 0 refills | Status: DC

## 2022-09-07 MED ORDER — METOPROLOL TARTRATE 100 MG PO TABS: 100.0000 mg | ORAL_TABLET | Freq: Two times a day (BID) | ORAL | 1 refills | Status: DC

## 2022-09-07 MED ORDER — ASPIRIN 81 MG PO TBEC: 81.0000 mg | DELAYED_RELEASE_TABLET | Freq: Every day | ORAL | 12 refills | Status: DC

## 2022-09-07 NOTE — Assessment & Plan Note (Signed)
Meets criteria with BMI greater than 30 ?

## 2022-09-07 NOTE — Assessment & Plan Note (Signed)
Secondary to pneumonia and COPD exacerbation.  Normally on 2 L, presented requiring 6 L.  With treatment of pneumonia and COPD, oxygenation improved and is currently back down to baseline.

## 2022-09-07 NOTE — Discharge Summary (Deleted)
Physician Discharge Summary   Patient: Erik [name] MRN: [id number] DOB: [birth year]  Admit date:     08/27/2022  Discharge date: 09/07/22  Discharge Physician: Hollice Espy   PCP: Sallyanne Kuster, NP   Recommendations at discharge:   Medication change: Aspirin decreased from 325 mg to 81 mg p.o. daily New medication: Plavix 75 mg p.o. daily New medication: Tylenol 650 p.o. every 6 hours as needed for mild pain or fever Medication change: Lopressor increased from 50 mg twice daily to 100 mg twice daily New medication: Afrin spray to nostrils twice a day New medication: MiraLAX 17 g p.o. daily Patient being discharged to skilled nursing Wound care to LLE scattered wounds: Every other day: Cleanse LLE with soap and water, rinse and dry. Cover with silicone foam dressings as needed for exudate. Change every other day. Wound care to right lower extremity: Every other day.  Cleanse wound RLE with saline, pat dry.  Cover with single layer of xeroform gauze and top with dry dressing.  Wrap with kerlix and ACE wrap from toes to knees.  Change every other day and PRN drainage strikethrough onto exterior of dressing. Nasal packing sponges need to remain in position until patient's follow-up visit with ENT New medication: Keflex 500 mg every 6 hours x 6 days Patient will follow-up on Monday, 2/5 with Dr. Jenne Campus, ENT  Discharge Diagnoses: Principal Problem:   Atrial fibrillation with RVR (HCC) Active Problems:   Acute on chronic respiratory failure with hypoxia (HCC)   CAP (community acquired pneumonia)   COPD exacerbation (HCC)   Epistaxis   Bilateral cellulitis of lower leg   PAD (peripheral artery disease) (HCC)   Chronic systolic CHF (congestive heart failure) (HCC)   Right sided weakness   CAD (coronary artery disease)   Obesity (BMI 30-39.9)  Resolved Problems:   Rhabdomyolysis  Hospital Course: 66 year old male with past medical history of CAD status post PCI, systolic  heart failure, atrial fibrillation and COPD on 2 L nasal cannula chronically who presented to the emergency room on 1/20 after a fall, which happened from worsening shortness of breath, dizziness and elevated heart rate starting the night before.  After the fall, patient landed on the ground and stayed on the ground for about 12 hours.  In the emergency room, noted to be in rapid atrial fibrillation and workup revealed pneumonia with acute on chronic respiratory failure requiring 6 L as well as worsening of chronic lower extremity wounds and rhabdomyolysis.  Assessment and Plan: * Atrial fibrillation with RVR (HCC) Rates as high as 152 in the ED.  He received one-time dose of metoprolol with no significant effect. Patient is not on AC due to prior refusal; CHA2DS2-VASc elevated at 4.  Weaned off of Cardizem drip.  Home Lopressor increased from 50 twice daily to 100 twice daily  Acute on chronic respiratory failure with hypoxia (HCC) Secondary to pneumonia and COPD exacerbation.  Normally on 2 L, presented requiring 6 L.  With treatment of pneumonia and COPD, oxygenation improved and is currently back down to baseline.  CAP (community acquired pneumonia) CT chest with left lower lobe opacity concerning for pneumonia.  Differential includes aspiration pneumonia as patient's wife states he routinely chokes on his food.  Treated with antibiotics.  Will have speech therapy evaluate patient  COPD exacerbation (HCC) Treated with nebulizers and steroids.  Antibiotics for pneumonia as above.  Oxygenation improved and by time of discharge, patient back to baseline.    Epistaxis Following stent  placement, patient has been on aspirin Plavix plus Lovenox for DVT prophylaxis.  On night of 1/30, developed severe continuous nosebleed.  Unrelenting and ENT consulted.  Cottonoid pledget along with phenylephrine lidocaine solution placed in right nostril for 6 minutes.  Small area of bleeding and right anterior septum  cauterized with silver nitrate.  Some significant edema and erythema and Merocel sponge placed with bacitracin ointment and bandage with oxymetazoline.  ENT recommended keeping packing in place until they see patient on 2/5.  Patient also put on Keflex for this time.  Bilateral cellulitis of lower leg Patient has a history of bilateral lower extremity cellulitis with admission in December 2023.  Per images taken during that admission, patient's lower extremities, including the wound on the lateral aspect of the right leg, appears significantly worsened.  There is evidence of purulent drainage.    Patient placed on IV antibiotics.  No evidence of osteomyelitis.  ABIs noted significant evidence of worsening PAD.  Vascular surgery consulted and patient underwent recannulization of right lower extremity for limb salvage with stent placement on 1/24.  Previously had been on aspirin only, changed to aspirin and Plavix which will be continued.  Patient will follow-up with vascular surgery as outpatient.  - Start vancomycin per pharmacy dosing - Obtain ABIs  - Will obtain a right tib-fib x-ray to evaluate for osteomyelitis  PAD (peripheral artery disease) ([place]) As above.  Now on aspirin and Plavix.  Status post stent placement.  Chronic systolic CHF (congestive heart failure) (HCC) Last echocardiogram in June 2023 with EF of 45-50%.  BNP on admission at 548.  Home torsemide held.  Repeat BNP on 1/31 at   Right sided weakness Patient's wife states he has been leaning towards the right for several days prior to admission.  MRI negative for CVA and symptoms felt to be secondary to PAD and deconditioning.  Status post stent placement as above.  Seen by PT and OT who are recommending skilled nursing.    Rhabdomyolysis-resolved as of 09/07/2022 Treated with IV fluids.  No renal dysfunction.  Resolved.  CAD (coronary artery disease) Troponin mildly elevated today consistent with demand ischemia.  EKG with no  changes.  - Continue home aspirin - Obtain lipid panel and A1c  Obesity (BMI 30-39.9) Meets criteria with BMI greater than 30         Consultants:  -ENT Vascular surgery Procedures performed:  -Status post angiogram of the right lower extremity and stent placement of the right superficial femoral artery and popliteal artery with percutaneous transluminal angioplasty and stent placement of the right common iliac artery -Treatment of nosebleed with silver nitrate and wound packing  Disposition: Skilled nursing Diet recommendation:  Discharge Diet Orders (From admission, onward)     Start     Ordered   09/07/22 0000  Diet - low sodium heart healthy        09/07/22 1051           Cardiac diet DISCHARGE MEDICATION: Allergies as of 09/07/2022       Reactions   Benadryl [diphenhydramine Hcl] Shortness Of Breath   Morphine And Related Swelling   Difficulty breathing per patient    Oxycodone Swelling   Severe mouth swelling requiring medical intervention        Medication List     STOP taking these medications    gabapentin 100 MG capsule Commonly known as: NEURONTIN       TAKE these medications    acetaminophen 325 MG tablet  Commonly known as: TYLENOL Take 2 tablets (650 mg total) by mouth every 6 (six) hours as needed for mild pain (or Fever >/= 101).   albuterol 108 (90 Base) MCG/ACT inhaler Commonly known as: VENTOLIN HFA Inhale 1 puff into the lungs every 6 (six) hours as needed for wheezing or shortness of breath.   aspirin EC 81 MG tablet Take 1 tablet (81 mg total) by mouth daily. Swallow whole. Start taking on: September 08, 2022 What changed:  medication strength how much to take additional instructions   clopidogrel 75 MG tablet Commonly known as: PLAVIX Take 1 tablet (75 mg total) by mouth daily. Start taking on: September 08, 2022   hydrOXYzine 25 MG tablet Commonly known as: ATARAX Take 1 tablet (25 mg total) by mouth 3 (three) times  daily as needed for anxiety.   ipratropium-albuterol 0.5-2.5 (3) MG/3ML Soln Commonly known as: DUONEB USE 1 VIAL VIA NEBULIZER EVERY 6 HOURS   leptospermum manuka honey Pste paste Apply 1 Application topically daily.   metoprolol tartrate 100 MG tablet Commonly known as: LOPRESSOR Take 1 tablet (100 mg total) by mouth 2 (two) times daily. What changed:  medication strength See the new instructions.   oxymetazoline 0.05 % nasal spray Commonly known as: AFRIN Place 1 spray into both nostrils 2 (two) times daily.   polyethylene glycol 17 g packet Commonly known as: MIRALAX / GLYCOLAX Take 17 g by mouth daily. Start taking on: September 08, 2022   torsemide 20 MG tablet Commonly known as: DEMADEX Take 2 tablets (40 mg total) by mouth daily.   Trelegy Ellipta 100-62.5-25 MCG/ACT Aepb Generic drug: Fluticasone-Umeclidin-Vilant INHALE 1 PUFF BY MOUTH INTO LUNGS DAILY               Discharge Care Instructions  (From admission, onward)           Start     Ordered   09/07/22 0000  Discharge wound care:       Comments: Wound care to LLE scattered wounds:  Cleanse LLE with soap and water, rinse and dry. Cover with silicone foam dressings as needed for exudate. Change every other day.   Wound care  Every other day    Comments: Cleanse wound RLE with saline, pat dry Cover with single layer of xeroform gauze and top with dry dressing Wrap with kerlix and ACE wrap from toes to knees.  Change every other day and PRN drainage strikethrough onto exterior of dressing.   09/07/22 1051            Discharge Exam: Filed Weights   08/27/22 1401 08/31/22 0412 09/07/22 0849  Weight: 108 kg 110.8 kg 111.1 kg   General: Alert and oriented x 2-3, no acute distress Cardiovascular: Irregular rhythm, rate controlled Lungs: Clear to auscultation bilaterally  Condition at discharge: good  The results of significant diagnostics from this hospitalization (including imaging,  microbiology, ancillary and laboratory) are listed below for reference.   Imaging Studies: PERIPHERAL VASCULAR CATHETERIZATION  Result Date: 08/31/2022 See surgical note for result.  ECHOCARDIOGRAM COMPLETE  Result Date: 08/30/2022    ECHOCARDIOGRAM REPORT   Patient Name:   Erik [name] Date of Exam: 08/30/2022 Medical Rec #:  [id number]      Height:       70.0 in Accession #:    [id number]     Weight:       238.1 lb Date of Birth:  07/20/1956     BSA:  2.248 m Patient Age:    70 years       BP:           116/76 mmHg Patient Gender: M              HR:           110 bpm. Exam Location:  [facility] Procedure: 2D Echo Indications:     pulmonary embolus  History:         Patient has prior history of Echocardiogram examinations, most                  recent 01/14/2022. CHF, CAD, COPD, Arrythmias:Atrial                  Fibrillation; Risk Factors:Dyslipidemia and Hypertension.  Sonographer:     Harvie Junior Referring Phys:  [id number] [place] Diagnosing Phys: Kathlyn Sacramento MD  Sonographer Comments: Technically difficult study due to poor echo windows, patient is obese and no subcostal window. Image acquisition challenging due to patient body habitus and supine. IMPRESSIONS  1. Left ventricular ejection fraction, by estimation, is 35 to 40%. The left ventricle has moderately decreased function. Left ventricular endocardial border not optimally defined to evaluate regional wall motion. Left ventricular diastolic parameters are indeterminate.  2. Right ventricular systolic function is normal. The right ventricular size is mildly enlarged. There is moderately elevated pulmonary artery systolic pressure.  3. Left atrial size was mildly dilated.  4. The mitral valve is abnormal. Mild to moderate mitral valve regurgitation. No evidence of mitral stenosis. Moderate mitral annular calcification.  5. The aortic valve is normal in structure. Aortic valve regurgitation is trivial. Aortic valve sclerosis/calcification is  present, without any evidence of aortic stenosis.  6. The inferior vena cava is dilated in size with <50% respiratory variability, suggesting right atrial pressure of 15 mmHg. FINDINGS  Left Ventricle: Left ventricular ejection fraction, by estimation, is 35 to 40%. The left ventricle has moderately decreased function. Left ventricular endocardial border not optimally defined to evaluate regional wall motion. The left ventricular internal cavity size was normal in size. There is no left ventricular hypertrophy. Left ventricular diastolic parameters are indeterminate. Right Ventricle: The right ventricular size is mildly enlarged. No increase in right ventricular wall thickness. Right ventricular systolic function is normal. There is moderately elevated pulmonary artery systolic pressure. The tricuspid regurgitant velocity is 2.91 m/s, and with an assumed right atrial pressure of 15 mmHg, the estimated right ventricular systolic pressure is 19.3 mmHg. Left Atrium: Left atrial size was mildly dilated. Right Atrium: Right atrial size was normal in size. Pericardium: There is no evidence of pericardial effusion. Mitral Valve: The mitral valve is abnormal. There is mild thickening of the mitral valve leaflet(s). There is moderate calcification of the mitral valve leaflet(s). Moderate mitral annular calcification. Mild to moderate mitral valve regurgitation. No evidence of mitral valve stenosis. Tricuspid Valve: The tricuspid valve is normal in structure. Tricuspid valve regurgitation is mild . No evidence of tricuspid stenosis. Aortic Valve: The aortic valve is normal in structure. Aortic valve regurgitation is trivial. Aortic regurgitation PHT measures 498 msec. Aortic valve sclerosis/calcification is present, without any evidence of aortic stenosis. Aortic valve mean gradient  measures 4.6 mmHg. Aortic valve peak gradient measures 8.1 mmHg. Aortic valve area, by VTI measures 1.23 cm. Pulmonic Valve: The pulmonic valve  was normal in structure. Pulmonic valve regurgitation is not visualized. No evidence of pulmonic stenosis. Aorta: The aortic root is normal in size and  structure. Venous: The inferior vena cava is dilated in size with less than 50% respiratory variability, suggesting right atrial pressure of 15 mmHg. IAS/Shunts: No atrial level shunt detected by color flow Doppler.  LEFT VENTRICLE PLAX 2D LVIDd:         4.40 cm      Diastology LVIDs:         3.50 cm      LV e' medial:    13.27 cm/s LV PW:         1.00 cm      LV E/e' medial:  10.2 LV IVS:        1.00 cm      LV e' lateral:   11.60 cm/s LVOT diam:     1.80 cm      LV E/e' lateral: 11.6 LV SV:         28 LV SV Index:   12 LVOT Area:     2.54 cm                              3D Volume EF: LV Volumes (MOD)            3D EF:        48 % LV vol d, MOD A2C: 96.9 ml  LV EDV:       217 ml LV vol d, MOD A4C: 111.6 ml LV ESV:       113 ml LV vol s, MOD A2C: 54.1 ml  LV SV:        104 ml LV vol s, MOD A4C: 72.1 ml LV SV MOD A2C:     42.8 ml LV SV MOD A4C:     111.6 ml LV SV MOD BP:      46.4 ml RIGHT VENTRICLE RV Basal diam:  5.40 cm RV Mid diam:    4.30 cm RV S prime:     8.22 cm/s TAPSE (M-mode): 1.4 cm LEFT ATRIUM             Index        RIGHT ATRIUM           Index LA diam:        4.60 cm 2.05 cm/m   RA Area:     28.40 cm LA Vol (A2C):   79.7 ml 35.46 ml/m  RA Volume:   101.00 ml 44.93 ml/m LA Vol (A4C):   65.2 ml 29.01 ml/m LA Biplane Vol: 74.8 ml 33.28 ml/m  AORTIC VALVE                    PULMONIC VALVE AV Area (Vmax):    1.18 cm     PV Vmax:       1.08 m/s AV Area (Vmean):   1.12 cm     PV Peak grad:  4.7 mmHg AV Area (VTI):     1.23 cm AV Vmax:           142.60 cm/s AV Vmean:          99.000 cm/s AV VTI:            0.225 m AV Peak Grad:      8.1 mmHg AV Mean Grad:      4.6 mmHg LVOT Vmax:         66.10 cm/s LVOT Vmean:        43.525 cm/s LVOT VTI:  0.108 m LVOT/AV VTI ratio: 0.48 AI PHT:            498 msec  AORTA Ao Root diam: 3.30 cm MITRAL VALVE                 TRICUSPID VALVE MV Area (PHT): 4.09 cm     TR Peak grad:   33.9 mmHg MV Decel Time: 186 msec     TR Vmax:        291.00 cm/s MR Peak grad: 65.9 mmHg MR Vmax:      405.80 cm/s   SHUNTS MV E velocity: 134.67 cm/s  Systemic VTI:  0.11 m                             Systemic Diam: 1.80 cm Kathlyn Sacramento MD Electronically signed by Kathlyn Sacramento MD Signature Date/Time: 08/30/2022/4:52:58 PM    Final    US ARTERIAL ABI (SCREENING LOWER EXTREMITY)  Result Date: 08/28/2022 CLINICAL DATA:  Left leg wound EXAM: NONINVASIVE PHYSIOLOGIC VASCULAR STUDY OF BILATERAL LOWER EXTREMITIES TECHNIQUE: Evaluation of both lower extremities were performed at rest, including calculation of ankle-brachial indices with single level pressure measurements and doppler recording. COMPARISON:  None available. FINDINGS: Right ABI:  1.15 Left ABI:  1.13 Right Lower Extremity: Monophasic dorsalis pedis and posterior tibial artery waveforms. Left Lower Extremity: Monophasic posterior tibial and dorsalis pedis artery waveforms. IMPRESSION: Although ankle-brachial indices are within normal limits, there are monophasic bilateral lower extremity arterial waveforms which suggest that there is underlying arterial occlusive disease. If clinical suspicion remains high for arterial occlusive disease, further evaluation with CT angiography runoff of the lower extremity should be performed. Electronically Signed   By: Miachel Roux M.D.   On: 08/28/2022 19:58   MR BRAIN WO CONTRAST  Result Date: 08/27/2022 CLINICAL DATA:  Neuro deficit, acute, stroke suspected EXAM: MRI HEAD WITHOUT CONTRAST TECHNIQUE: Multiplanar, multiecho pulse sequences of the brain and surrounding structures were obtained without intravenous contrast. COMPARISON:  CT head from the same day. FINDINGS: Brain: No acute infarction, hemorrhage, hydrocephalus, extra-axial collection or mass lesion. Remote right frontal infarct with associated encephalomalacia and gliosis.  Additional scattered T2/FLAIR hyperintensities in the white matter are compatible with chronic microvascular ischemic disease. Cerebral atrophy with ex vacuo ventricular dilation. Callosal angle is within normal limits. Vascular: Major arterial flow voids are maintained at the skull base. Skull and upper cervical spine: Normal marrow signal. Sinuses/Orbits: Clear sinuses.  No acute orbital findings. Other: No mastoid effusions. IMPRESSION: 1. No evidence of acute intracranial abnormality. 2. 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 3.  Cerebral Atrophy (ICD10-G31.9). Electronically Signed   By: Margaretha Sheffield M.D.   On: 08/27/2022 19:27   CT Head Wo Contrast  Result Date: 08/27/2022 CLINICAL DATA:  Trauma. EXAM: CT HEAD WITHOUT CONTRAST CT CERVICAL SPINE WITHOUT CONTRAST TECHNIQUE: Multidetector CT imaging of the head and cervical spine was performed following the standard protocol without intravenous contrast. Multiplanar CT image reconstructions of the cervical spine were also generated. RADIATION DOSE REDUCTION: This exam was performed according to the departmental dose-optimization program which includes automated exposure control, adjustment of the mA and/or kV according to patient size and/or use of iterative reconstruction technique. COMPARISON:  None Available. FINDINGS: CT HEAD FINDINGS Brain: Ventricles and sulci are appropriate for patient's age. Periventricular and subcortical white matter hypodensities compatible with chronic microvascular ischemic changes. Encephalomalacia within the anterior right frontal lobe suggestive of chronic  infarct. Additionally within the right posterior parietal lobe there is focal encephalomalacia suggestive of a chronic infarct. No evidence for acute cortically based infarct, intracranial hemorrhage, mass lesion or mass-effect. Vascular: No hyperdense vessel or unexpected calcification. Skull: Normal. Negative for fracture or focal  lesion. Sinuses/Orbits: Paranasal sinuses are well aerated. Mastoid air cells are unremarkable. Orbits are unremarkable. Other: None CT CERVICAL SPINE FINDINGS Alignment: Straightening of the normal cervical lordosis. Skull base and vertebrae: Intact. Soft tissues and spinal canal: No prevertebral fluid or swelling. No visible canal hematoma. Disc levels: No acute fracture. Degenerative disc disease most pronounced C5-6. Left C2-3 facet fusion. Left C3-4 facet degenerative changes. Upper chest: Apical emphysematous change. Other: None IMPRESSION: 1. No acute intracranial findings. 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 2. No acute cervical spine fracture. Multilevel degenerative changes. Electronically Signed   By: Lovey Newcomer M.D.   On: 08/27/2022 15:52   CT Cervical Spine Wo Contrast  Result Date: 08/27/2022 CLINICAL DATA:  Trauma. EXAM: CT HEAD WITHOUT CONTRAST CT CERVICAL SPINE WITHOUT CONTRAST TECHNIQUE: Multidetector CT imaging of the head and cervical spine was performed following the standard protocol without intravenous contrast. Multiplanar CT image reconstructions of the cervical spine were also generated. RADIATION DOSE REDUCTION: This exam was performed according to the departmental dose-optimization program which includes automated exposure control, adjustment of the mA and/or kV according to patient size and/or use of iterative reconstruction technique. COMPARISON:  None Available. FINDINGS: CT HEAD FINDINGS Brain: Ventricles and sulci are appropriate for patient's age. Periventricular and subcortical white matter hypodensities compatible with chronic microvascular ischemic changes. Encephalomalacia within the anterior right frontal lobe suggestive of chronic infarct. Additionally within the right posterior parietal lobe there is focal encephalomalacia suggestive of a chronic infarct. No evidence for acute cortically based infarct, intracranial hemorrhage, mass lesion or  mass-effect. Vascular: No hyperdense vessel or unexpected calcification. Skull: Normal. Negative for fracture or focal lesion. Sinuses/Orbits: Paranasal sinuses are well aerated. Mastoid air cells are unremarkable. Orbits are unremarkable. Other: None CT CERVICAL SPINE FINDINGS Alignment: Straightening of the normal cervical lordosis. Skull base and vertebrae: Intact. Soft tissues and spinal canal: No prevertebral fluid or swelling. No visible canal hematoma. Disc levels: No acute fracture. Degenerative disc disease most pronounced C5-6. Left C2-3 facet fusion. Left C3-4 facet degenerative changes. Upper chest: Apical emphysematous change. Other: None IMPRESSION: 1. No acute intracranial findings. 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 2. No acute cervical spine fracture. Multilevel degenerative changes. Electronically Signed   By: Lovey Newcomer M.D.   On: 08/27/2022 15:52   CT CHEST ABDOMEN PELVIS W CONTRAST  Result Date: 08/27/2022 CLINICAL DATA:  Sepsis. EXAM: CT CHEST, ABDOMEN, AND PELVIS WITH CONTRAST TECHNIQUE: Multidetector CT imaging of the chest, abdomen and pelvis was performed following the standard protocol during bolus administration of intravenous contrast. RADIATION DOSE REDUCTION: This exam was performed according to the departmental dose-optimization program which includes automated exposure control, adjustment of the mA and/or kV according to patient size and/or use of iterative reconstruction technique. CONTRAST:  192mL OMNIPAQUE IOHEXOL 300 MG/ML  SOLN COMPARISON:  Abdomen and pelvis CT 09/04/2007. Abdomen CT 03/25/2016. Chest CT 09/09/2019. FINDINGS: CT CHEST FINDINGS Cardiovascular: Heart is enlarged. No pericardial effusion. Coronary artery calcification is evident. Mild atherosclerotic calcification is noted in the wall of the thoracic aorta. Enlargement of the pulmonary outflow tract/main pulmonary arteries suggests pulmonary arterial hypertension. Mediastinum/Nodes:  Borderline lymphadenopathy in the subcarinal station, only minimally progressive since 09/09/2019. There is no hilar lymphadenopathy. The esophagus  has normal imaging features. There is no axillary lymphadenopathy. Lungs/Pleura: Centrilobular and paraseptal emphysema evident. Bullous changes noted in the lung apices. 9 x 5 mm posterior right upper lobe pulmonary nodule on 74/5 is stable comparing back to 09/09/2019 consistent with benign etiology. No followup imaging is recommended. Patchy airspace disease in the posterior left lower lobe is associated with mild volume loss. Imaging features are suspicious for pneumonia with similar dependent atelectasis in the deep costophrenic sulci bilaterally. There is a tiny left pleural effusion with trace fluid in the cranial aspect of the major fissure. Musculoskeletal: No worrisome lytic or sclerotic osseous abnormality. CT ABDOMEN PELVIS FINDINGS Hepatobiliary: No suspicious focal abnormality within the liver parenchyma. Cholecystectomy. No intrahepatic or extrahepatic biliary dilation. Pancreas: No focal mass lesion. No dilatation of the main duct. No intraparenchymal cyst. No peripancreatic edema. Spleen: No splenomegaly. No focal mass lesion. Adrenals/Urinary Tract: No adrenal nodule or mass. Kidneys unremarkable. No evidence for hydroureter. The urinary bladder appears normal for the degree of distention. Stomach/Bowel: Stomach is unremarkable. No gastric wall thickening. No evidence of outlet obstruction. Duodenum is normally positioned as is the ligament of Treitz. No small bowel wall thickening. No small bowel dilatation. The terminal ileum is normal. The appendix is normal. No gross colonic mass. No colonic wall thickening. Vascular/Lymphatic: There is moderate atherosclerotic calcification of the abdominal aorta without aneurysm. There is no gastrohepatic or hepatoduodenal ligament lymphadenopathy. No retroperitoneal or mesenteric lymphadenopathy. 8 mm left common  iliac node is stable in the interval, likely reactive. 9 mm short axis right external iliac node on 96/3 is stable comparing back to the 2009 exam consistent with benign reactive etiology. Stable right external iliac node at upper normal for size. Reproductive: The prostate gland and seminal vesicles are unremarkable. Other: No intraperitoneal free fluid. Musculoskeletal: Small bilateral groin hernias contain only fat. Advanced degenerative changes are noted in both hips. No worrisome lytic or sclerotic osseous abnormality. Superior endplate compression deformity noted at L2, L4, and L5. IMPRESSION: 1. Patchy airspace disease in the posterior left lower lobe is associated with mild volume loss. Imaging features are suspicious for pneumonia. There is associated symmetric dependent atelectasis in the deep costophrenic sulci bilaterally. 2. Tiny left pleural effusion with trace fluid in the cranial aspect of the major fissure. 3. Enlargement of the pulmonary outflow tract/main pulmonary arteries suggests pulmonary arterial hypertension. 4. No acute findings in the abdomen or pelvis. 5. Small bilateral groin hernias contain only fat. 6. Superior endplate compression deformity at L2, L4, and L5. 7. Advanced changes of emphysema with bullous disease in the upper lungs. 8. Aortic Atherosclerosis (ICD10-I70.0) and Emphysema (ICD10-J43.9). Electronically Signed   By: Misty Stanley M.D.   On: 08/27/2022 15:51   DG Tibia/Fibula Right  Result Date: 08/27/2022 CLINICAL DATA:  Right lower leg infection EXAM: RIGHT TIBIA AND FIBULA - 2 VIEW COMPARISON:  None Available. FINDINGS: Soft tissue defect noted laterally in the right calf. No acute bony abnormality. Specifically, no fracture, subluxation, or dislocation. No bone destruction to suggest osteomyelitis. No soft tissue gas. IMPRESSION: No acute bony abnormality. Electronically Signed   By: Rolm Baptise M.D.   On: 08/27/2022 14:25   DG Chest Portable 1 View  Result  Date: 08/27/2022 CLINICAL DATA:  Shortness of breath EXAM: PORTABLE CHEST 1 VIEW COMPARISON:  07/13/2022 FINDINGS: Cardiomegaly. No confluent airspace opacities, effusions or edema. No acute bony abnormality. Aortic atherosclerosis. IMPRESSION: Cardiomegaly.  No active disease. Electronically Signed   By: Rolm Baptise M.D.  On: 08/27/2022 14:25    Microbiology: Results for orders placed or performed during the hospital encounter of 08/27/22  Culture, blood (Routine x 2)     Status: None   Collection Time: 08/27/22  1:56 PM   Specimen: Right Antecubital; Blood  Result Value Ref Range Status   Specimen Description RIGHT ANTECUBITAL  Final   Special Requests   Final    BOTTLES DRAWN AEROBIC AND ANAEROBIC Blood Culture adequate volume   Culture   Final    NO GROWTH 5 DAYS Performed at [facility], [address], [place], [place] [address]    Report Status 09/01/2022 FINAL  Final  Culture, blood (Routine x 2)     Status: None   Collection Time: 08/27/22  2:22 PM   Specimen: BLOOD  Result Value Ref Range Status   Specimen Description BLOOD LEFT ANTECUBITAL  Final   Special Requests   Final    BOTTLES DRAWN AEROBIC AND ANAEROBIC Blood Culture adequate volume   Culture   Final    NO GROWTH 5 DAYS Performed at [facility], [address] [place], [place] [address]    Report Status 09/01/2022 FINAL  Final  Resp panel by RT-PCR (RSV, Flu A&B, Covid) Anterior Nasal Swab     Status: None   Collection Time: 08/27/22  6:11 PM   Specimen: Anterior Nasal Swab  Result Value Ref Range Status   SARS Coronavirus 2 by RT PCR NEGATIVE NEGATIVE Final    Comment: (NOTE) SARS-CoV-2 target nucleic acids are NOT DETECTED.  The SARS-CoV-2 RNA is generally detectable in upper respiratory specimens during the acute phase of infection. The lowest concentration of SARS-CoV-2 viral copies this assay can detect is 138 copies/mL. A negative result does not preclude SARS-Cov-2 infection  and should not be used as the sole basis for treatment or other patient management decisions. A negative result may occur with  improper specimen collection/handling, submission of specimen other than nasopharyngeal swab, presence of viral mutation(s) within the areas targeted by this assay, and inadequate number of viral copies(<138 copies/mL). A negative result must be combined with clinical observations, patient history, and epidemiological information. The expected result is Negative.  Fact Sheet for Patients:  EntrepreneurPulse.com.au  Fact Sheet for Healthcare Providers:  IncredibleEmployment.be  This test is no t yet approved or cleared by the [place] FDA and  has been authorized for detection and/or diagnosis of SARS-CoV-2 by FDA under an Emergency Use Authorization (EUA). This EUA will remain  in effect (meaning this test can be used) for the duration of the COVID-19 declaration under Section 564(b)(1) of the Act, 21 U.S.C.section 360bbb-3(b)(1), unless the authorization is terminated  or revoked sooner.       Influenza A by PCR NEGATIVE NEGATIVE Final   Influenza B by PCR NEGATIVE NEGATIVE Final    Comment: (NOTE) The Xpert Xpress SARS-CoV-2/FLU/RSV plus assay is intended as an aid in the diagnosis of influenza from Nasopharyngeal swab specimens and should not be used as a sole basis for treatment. Nasal washings and aspirates are unacceptable for Xpert Xpress SARS-CoV-2/FLU/RSV testing.  Fact Sheet for Patients: EntrepreneurPulse.com.au  Fact Sheet for Healthcare Providers: IncredibleEmployment.be  This test is not yet approved or cleared by the [place] FDA and has been authorized for detection and/or diagnosis of SARS-CoV-2 by FDA under an Emergency Use Authorization (EUA). This EUA will remain in effect (meaning this test can be used) for the duration of the COVID-19 declaration  under Section 564(b)(1) of  the Act, 21 U.S.C. section 360bbb-3(b)(1), unless the authorization is terminated or revoked.     Resp Syncytial Virus by PCR NEGATIVE NEGATIVE Final    Comment: (NOTE) Fact Sheet for Patients: EntrepreneurPulse.com.au  Fact Sheet for Healthcare Providers: IncredibleEmployment.be  This test is not yet approved or cleared by the [place] FDA and has been authorized for detection and/or diagnosis of SARS-CoV-2 by FDA under an Emergency Use Authorization (EUA). This EUA will remain in effect (meaning this test can be used) for the duration of the COVID-19 declaration under Section 564(b)(1) of the Act, 21 U.S.C. section 360bbb-3(b)(1), unless the authorization is terminated or revoked.  Performed at [facility], [place], [place] [address]   Respiratory (~20 pathogens) panel by PCR     Status: None   Collection Time: 08/27/22  6:11 PM   Specimen: Nasopharyngeal Swab; Respiratory  Result Value Ref Range Status   Adenovirus NOT DETECTED NOT DETECTED Final   Coronavirus 229E NOT DETECTED NOT DETECTED Final    Comment: (NOTE) The Coronavirus on the Respiratory Panel, DOES NOT test for the novel  Coronavirus (2019 nCoV)    Coronavirus HKU1 NOT DETECTED NOT DETECTED Final   Coronavirus NL63 NOT DETECTED NOT DETECTED Final   Coronavirus OC43 NOT DETECTED NOT DETECTED Final   Metapneumovirus NOT DETECTED NOT DETECTED Final   Rhinovirus / Enterovirus NOT DETECTED NOT DETECTED Final   Influenza A NOT DETECTED NOT DETECTED Final   Influenza B NOT DETECTED NOT DETECTED Final   Parainfluenza Virus 1 NOT DETECTED NOT DETECTED Final   Parainfluenza Virus 2 NOT DETECTED NOT DETECTED Final   Parainfluenza Virus 3 NOT DETECTED NOT DETECTED Final   Parainfluenza Virus 4 NOT DETECTED NOT DETECTED Final   Respiratory Syncytial Virus NOT DETECTED NOT DETECTED Final   Bordetella pertussis NOT DETECTED NOT  DETECTED Final   Bordetella Parapertussis NOT DETECTED NOT DETECTED Final   Chlamydophila pneumoniae NOT DETECTED NOT DETECTED Final   Mycoplasma pneumoniae NOT DETECTED NOT DETECTED Final    Comment: Performed at [facility], [place]. [address], [place], [place] [address]    Labs: CBC: Recent Labs  Lab 09/04/22 [phone] 09/05/22 0550 09/05/22 1753 09/06/22 0647 09/07/22 0135  WBC 20.3* 14.6* 12.9* 11.3* 12.1*  HGB 13.5 14.9 13.8 13.0 12.8*  HCT 45.4 49.0 45.5 43.1 41.2  MCV 98.5 96.1 96.0 95.4 93.8  PLT 273 510* 496* 453* [id number]*   Basic Metabolic Panel: Recent Labs  Lab 09/03/22 0614 09/04/22 0458 09/05/22 0550 09/06/22 0647 09/07/22 0135  NA 137 138 141 137 137  K 4.6 4.9 4.5 4.1 4.4  CL 98 100 100 98 99  CO2 30 31 35* 35* 32  GLUCOSE 150* 203* 109* 145* 134*  BUN 14 14 20 21 19   CREATININE 0.75 0.81 0.73 0.71 0.71  CALCIUM 8.3* 8.2* 8.6* 7.7* 8.0*  MG 2.1 2.2 2.2 2.1 2.1   Liver Function Tests: Recent Labs  Lab 09/03/22 0614 09/04/22 0458 09/05/22 0550 09/06/22 0647 09/07/22 0135  AST 27 38 77* 46* 34  ALT 44 39 89* 70* 57*  ALKPHOS 86 87 96 86 91  BILITOT 0.6 1.1 0.9 0.6 0.7  PROT 6.1* 5.8* 5.7* 5.0* 5.2*  ALBUMIN 2.7* 2.5* 2.6* 2.5* 2.6*   CBG: Recent Labs  Lab 09/05/22 1311 09/05/22 1721 09/05/22 2038 09/06/22 0016 09/06/22 0443  GLUCAP 148* 93 193* 126* 91    Discharge time spent: greater than 30 minutes.  Signed: Annita Brod, MD  [facility] 09/07/2022

## 2022-09-07 NOTE — Progress Notes (Signed)
[facility] Progress Note  Patient: Erik [name]    WVP:[id number]  DOA: 08/27/2022    Date of Service: the patient was seen and examined on 09/07/2022  Brief hospital course: 67 year old male with past medical history of CAD status post PCI, systolic heart failure, atrial fibrillation and COPD on 2 L nasal cannula chronically who presented to the emergency room on 1/20 after a fall, which happened from worsening shortness of breath, dizziness and elevated heart rate starting the night before.  After the fall, patient landed on the ground and stayed on the ground for about 12 hours.  In the emergency room, noted to be in rapid atrial fibrillation and workup revealed pneumonia with acute on chronic respiratory failure requiring 6 L as well as worsening of chronic lower extremity wounds and rhabdomyolysis.  Patient treated with steroids, nebulizers and antibiotics and after several days, back to baseline.  Hospital course complicated by persistent nosebleed on 1/30 requiring ENT intervention.  On 1/31, BNP checked and found to be elevated close to 1000 and patient started on IV Lasix.   Assessment and Plan: * Atrial fibrillation with RVR (HCC) Rates as high as 152 in the ED.  He received one-time dose of metoprolol with no significant effect. Patient is not on AC due to prior refusal; CHA2DS2-VASc elevated at 4.  Weaned off of Cardizem drip.  Home Lopressor increased from 50 twice daily to 100 twice daily.  As he is further diuresed, heart rate should improve  Acute on chronic respiratory failure with hypoxia (HCC) Secondary to pneumonia and COPD exacerbation.  Normally on 2 L, presented requiring 6 L.  With treatment of pneumonia and COPD, oxygenation improved and is currently back down to baseline.  CAP (community acquired pneumonia) CT chest with left lower lobe opacity concerning for pneumonia.  Differential includes aspiration pneumonia as patient's wife states he routinely chokes on his  food.  Treated with antibiotics.  Will have speech therapy evaluate patient  COPD exacerbation (HCC) Treated with nebulizers and steroids.  Antibiotics for pneumonia as above.  Oxygenation improved and by time of discharge, patient back to baseline.    Epistaxis Following stent placement, patient has been on aspirin Plavix plus Lovenox for DVT prophylaxis.  On night of 1/30, developed severe continuous nosebleed.  Unrelenting and ENT consulted.  Cottonoid pledget along with phenylephrine lidocaine solution placed in right nostril for 6 minutes.  Small area of bleeding and right anterior septum cauterized with silver nitrate.  Some significant edema and erythema and Merocel sponge placed with bacitracin ointment and bandage with oxymetazoline.  ENT recommended keeping packing in place until they see patient on 2/5.  Patient also put on Keflex for this time.  Bilateral cellulitis of lower leg Patient has a history of bilateral lower extremity cellulitis with admission in December 2023.  Per images taken during that admission, patient's lower extremities, including the wound on the lateral aspect of the right leg, appears significantly worsened.  There is evidence of purulent drainage.    Patient placed on IV antibiotics.  No evidence of osteomyelitis.  ABIs noted significant evidence of worsening PAD.  Vascular surgery consulted and patient underwent recannulization of right lower extremity for limb salvage with stent placement on 1/24.  Previously had been on aspirin only, changed to aspirin and Plavix which will be continued.  Patient will follow-up with vascular surgery as outpatient.  - Start vancomycin per pharmacy dosing - Obtain ABIs  - Will obtain a right tib-fib x-ray to  evaluate for osteomyelitis  PAD (peripheral artery disease) ([place]) As above.  Now on aspirin and Plavix.  Status post stent placement.  Acute on chronic systolic (congestive) heart failure (HCC) Last echocardiogram in June  2023 with EF of 45-50%.  BNP on admission at 548.  Home torsemide held.  BMP rechecked on 1/31 and elevated at 900.  Patient started on IV Lasix.  Right sided weakness Patient's wife states he has been leaning towards the right for several days prior to admission.  MRI negative for CVA and symptoms felt to be secondary to PAD and deconditioning.  Status post stent placement as above.  Seen by PT and OT who are recommending skilled nursing.    Rhabdomyolysis-resolved as of 09/07/2022 Treated with IV fluids.  No renal dysfunction.  Resolved.  CAD (coronary artery disease) Troponin mildly elevated today consistent with demand ischemia.  EKG with no changes.  - Continue home aspirin - Obtain lipid panel and A1c  Obesity (BMI 30-39.9) Meets criteria with BMI greater than 30       Body mass index is 35.14 kg/m.        Consultants:  -ENT Vascular surgery  Procedures performed:  -Status post angiogram of the right lower extremity and stent placement of the right superficial femoral artery and popliteal artery with percutaneous transluminal angioplasty and stent placement of the right common iliac artery -Treatment of nosebleed with silver nitrate and wound packing  Antimicrobials: IV vancomycin cefepime, course completed P.o. Keflex 1/30 - 2/5  Code Status: Full code   Subjective: Patient complains of weakness, breathing better  Objective: Vital signs were reviewed and unremarkable. Vitals:   09/07/22 1437 09/07/22 1532  BP:  100/64  Pulse: (!) 104 89  Resp: 20 19  Temp:  97.7 F (36.5 C)  SpO2:  98%    Intake/Output Summary (Last 24 hours) at 09/07/2022 1603 Last data filed at 09/07/2022 1531 Gross per 24 hour  Intake 240 ml  Output 2500 ml  Net -2260 ml   Filed Weights   08/27/22 1401 08/31/22 0412 09/07/22 0849  Weight: 108 kg 110.8 kg 111.1 kg   Body mass index is 35.14 kg/m.  Exam:  General: Alert and oriented x 3, no acute distress HEENT:  Normocephalic, nose is packed, mucous membranes are moist Cardiovascular: Irregular rhythm, borderline tachycardia Respiratory: Decreased breath sounds throughout secondary to body habitus Abdomen: Soft, nontender, nondistended, positive bowel sounds Musculoskeletal: No clubbing or cyanosis, trace pitting edema Skin: No skin breaks, tears or lesions Psychiatry: Appropriate, no evidence of psychoses Neurology: No focal deficits  Data Reviewed: BNP of 918.  White blood cell count of 12.1  Disposition:  Status is: Inpatient Remains inpatient appropriate because:  -Treatment of heart failure    Anticipated discharge date: 2/1  Family Communication: Will call wife as he is further diuresed, heart rate should continue to improve DVT Prophylaxis: Place and maintain sequential compression device Start: 09/06/22 1448    Author: Annita Brod ,MD 09/07/2022 4:03 PM  To reach On-call, see care teams to locate the attending and reach out via [email or web address]. Between 7PM-7AM, please contact night-coverage If you still have difficulty reaching the attending provider, please page the [facility] (Director on Call) for [facility] on amion for assistance.

## 2022-09-07 NOTE — Progress Notes (Signed)
Speech Language Pathology Treatment: Dysphagia  Patient Details Name: Erik [name] MRN: [id number] DOB: September 11, 1955 Today's Date: 09/07/2022 Time: 3235-5732 SLP Time Calculation (min) (ACUTE ONLY): 35 min  Assessment / Plan / Recommendation Clinical Impression  Pt seen for f/u toleration of diet; if any issues w/ swallowing. He was alert, verbally responsive and engaged in conversation w/ SLP; min WOB/SOB w/ ANY exertion including Talking. Pt appeared A/Ox4; cooperative and followed instructions w/ gentle cue. Nurse arrived at end of session. NSG reported No overt s/s of aspiration nor dysphagia w/ meals and Pills swallowed w/ water. Pt denied any difficulty swallowing also.  Pt on O2 [place] 3L, afebrile; elevated WBC 12.1. Edentulous at Baseline.  Pt explained general aspiration precautions and agreed verbally to the need for following them especially sitting upright for all oral intake -- supported behind the back more for full upright sitting. Pt assisted w/ positioning d/t weakness. Noted quick SOB/WOB w/ the exertion of moving in bed. He fed himself trials of thin liquids and cut solids. No overt clinical s/s of aspiration were noted w/ any consistency; respiratory status increased briefly w/ the exertion of mashing/gumming but returned to a calm and unlabored baseline post exertion; vocal quality clear b/t trials and no coughing. Oral phase appeared grossly [facility] for bolus management and timely A-P transfer for swallowing; oral clearing achieved w/ all consistencies. Noted pt required Time for mashing/gumming of the solids -- encouraged rest breaks b/t trials and moistening foods for conservation of energy. NSG again denied any deficits in swallowing today.    Pt appears at reduced risk for aspiration, dysphagia when following general aspiration precautions in setting of declined Cardiopulmonary status at Baseline.  Recommend continue a Regular/mech soft diet for ease of soft foods w/ gravies added  to moisten foods; Thin liquids. Recommend general aspiration precautions; Rest Breaks b/t trials to calm breathing from exertion - strategies/foods for conservation of energy and monitor WOB status. REFLUX precautions as needed. Tray setup and support sitting in a chair for meals for optimal eating/drinking.  ST services will sign off at this time w/ MD to reconsult if needed while admitted. MD/NSG updated, agreed. Pt agreed. Precautions posted in room.       HPI HPI: Per H&P "Erik [name] is a 67 y.o. male with medical history significant of CAD s/p PCI, HFrEF, permanent A-fib not on AC, COPD on home 2 L O2, chronic venous stasis who presents to the ED after a ground-level fall.     Erik [name] states that for the last 1 week, he has been experiencing increasing generalized weakness, however for the last several days, he has been more so weak on the right than the left.  His wife at bedside states that they have been noticing Erik [name] always leaning and falling towards the right even with they sit him up straight.  Last night, Erik [name] stated that he felt sudden onset worsening shortness of breath, dizziness and elevated heart rate.  Due to this, he slumped down against the wall and landed on the ground.  His wife checked his heart rate at that time and it was in the 160s.  He was not able to get up, so he laid on the ground until this morning around noon, and then called EMS.  He was down on the ground for approximately 12 hours.  Patient endorses some chest tightness while his heart was going quickly, but this self resolved and his heart rate came down.  Patient's wife states that for the last several days, his home health nurse has noted that his right lower extremity wound appears to be worsening with a increasing redness.  Erik [name] endorses worsening pain of both lower extremities.  His wife states that both lower extremities appear markedly more red than they were during his last hospitalization.   They deny any known fever, chills but endorses nausea with no vomiting.     ED course:  On arrival to the ED, patient was afebrile at 97.4 with blood pressure 138/115 and pulse of 152.  He was tachypneic at 31/min.".  Per CT of Chest at admit: "Centrilobular and paraseptal emphysema evident.  Bullous changes noted in the lung apices. 9 x 5 mm posterior right  upper lobe pulmonary nodule on 74/5 is stable comparing back to  09/09/2019 consistent with benign etiology. No followup imaging is  recommended. Patchy airspace disease in the posterior left lower  lobe is associated with mild volume loss. Imaging features are  suspicious for pneumonia with similar dependent atelectasis in the  deep costophrenic sulci bilaterally. There is a tiny left pleural  effusion with trace fluid".      SLP Plan  All goals met      Recommendations for follow up therapy are one component of a multi-disciplinary discharge planning process, led by the attending physician.  Recommendations may be updated based on patient status, additional functional criteria and insurance authorization.    Recommendations  Diet recommendations: Regular;Thin liquid (cut tougher meats for ease of mashing/gumming) Liquids provided via: Cup;Straw Medication Administration: Whole meds with liquid (as tolerates) Supervision: Patient able to self feed (setup) Compensations: Minimize environmental distractions;Slow rate;Small sips/bites;Follow solids with liquid (Rest Breaks as needed) Postural Changes and/or Swallow Maneuvers: Out of bed for meals;Seated upright 90 degrees;Upright 30-60 min after meal                General recommendations:  (Dietician) Oral Care Recommendations: Oral care BID;Oral care before and after PO;Patient independent with oral care (setup) Follow Up Recommendations: No SLP follow up Assistance recommended at discharge: PRN (to sit in chair for meals) SLP Visit Diagnosis: Dysphagia, unspecified (R13.10)  (increased oral phase for mashing/gumming of foods d/t Edentulous status) Plan: All goals met            Orinda Kenner, MS, [place]; [place] [phone] (ascom) Dayshia Ballinas  09/07/2022, 4:35 PM

## 2022-09-07 NOTE — Hospital Course (Signed)
67 year old male with past medical history of CAD status post PCI, systolic heart failure, atrial fibrillation and COPD on 2 L nasal cannula chronically who presented to the emergency room on 1/20 after a fall, which happened from worsening shortness of breath, dizziness and elevated heart rate starting the night before.  After the fall, patient landed on the ground and stayed on the ground for about 12 hours.  In the emergency room, noted to be in rapid atrial fibrillation and workup revealed pneumonia with acute on chronic respiratory failure requiring 6 L as well as worsening of chronic lower extremity wounds and rhabdomyolysis.  Patient treated with steroids, nebulizers and antibiotics and after several days, back to baseline.  Hospital course complicated by persistent nosebleed on 1/30 requiring ENT intervention.  On 1/31, BNP checked and found to be elevated close to 1000 and patient started on IV Lasix.

## 2022-09-07 NOTE — Assessment & Plan Note (Signed)
Following stent placement, patient has been on aspirin Plavix plus Lovenox for DVT prophylaxis.  On night of 1/30, developed severe continuous nosebleed.  Unrelenting and ENT consulted.  Cottonoid pledget along with phenylephrine lidocaine solution placed in right nostril for 6 minutes.  Small area of bleeding and right anterior septum cauterized with silver nitrate.  Some significant edema and erythema and Merocel sponge placed with bacitracin ointment and bandage with oxymetazoline.  ENT recommended keeping packing in place until they see patient on 2/5.  Patient also put on Keflex for this time.

## 2022-09-07 NOTE — Progress Notes (Signed)
Physical Therapy Treatment Patient Details Name: Erik [name] MRN: [id number] DOB: August 22, 1955 Today's Date: 09/07/2022   History of Present Illness Erik [name] is a 67 y.o. male with medical history significant of CAD s/p PCI, HFrEF, permanent A-fib not on AC, COPD on home 2 L O2, chronic venous stasis who presents to the ED after a ground-level fall.  Night before presentation, pt stated that he felt sudden onset worsening shortness of breath, dizziness and elevated heart rate.    Patient receiving inpatient treatment for A-fib, CAP, acute on chronic respiratory failure from COPD, rhabdomyolysis, and bilateral LE cellulitis.  Pt's head CT scan negative for acute findings, though patient experiencing R side weakness.    PT Comments    Pt is making limited goals this date, still fixed on wanting home discharge vs going to SNF. Pt able to sit at EOB and assisted in transfer and improving pain tolerance as pt still very limited in mobility secondary to pain. Once seated at EOB, reports improvement in breathing and wishes to remain upright. Gave call bell and encouraged to call RN staff when ready to return back to bed.   Recommendations for follow up therapy are one component of a multi-disciplinary discharge planning process, led by the attending physician.  Recommendations may be updated based on patient status, additional functional criteria and insurance authorization.  Follow Up Recommendations  Skilled nursing-short term rehab (<3 hours/day) Can patient physically be transported by private vehicle: No   Assistance Recommended at Discharge Frequent or constant Supervision/Assistance  Patient can return home with the following Two people to help with walking and/or transfers;Two people to help with bathing/dressing/bathroom;Assistance with cooking/housework;Help with stairs or ramp for entrance;Assist for transportation   Equipment Recommendations  Rolling walker (2  wheels);BSC/3in1;Wheelchair (measurements PT);Wheelchair cushion (measurements PT);Hospital bed    Recommendations for Other Services       Precautions / Restrictions Precautions Precautions: Fall Restrictions Weight Bearing Restrictions: No     Mobility  Bed Mobility Overal bed mobility: Needs Assistance Bed Mobility: Supine to Sit, Sit to Supine     Supine to sit: Mod assist     General bed mobility comments: used bed sheet to assist with sliding B LEs off bed. Needs mod assist for trunkal elevation. Onces seated at EOB, able to elevated bed height and pt able to perform LAQs. Wished to be left sitting at EOB with tray table. Declines to attempt further standing.    Transfers                   General transfer comment: Not attempted this date    Ambulation/Gait                   Stairs             Wheelchair Mobility    Modified Rankin (Stroke Patients Only)       Balance Overall balance assessment: Needs assistance Sitting-balance support: Feet supported, Single extremity supported Sitting balance-Leahy Scale: Fair                                      Cognition Arousal/Alertness: Awake/alert Behavior During Therapy: WFL for tasks assessed/performed Overall Cognitive Status: Within Functional Limits for tasks assessed  General Comments: cooperative but pain limited        Exercises      General Comments        Pertinent Vitals/Pain Pain Assessment Pain Assessment: Faces Faces Pain Scale: Hurts even more Pain Location: back Pain Descriptors / Indicators: Moaning, Grimacing, Guarding Pain Intervention(s): Limited activity within patient's tolerance, Repositioned    Home Living                          Prior Function            PT Goals (current goals can now be found in the care plan section) Acute Rehab PT Goals Patient Stated Goal: to get  stronger PT Goal Formulation: With patient Time For Goal Achievement: 09/12/22 Potential to Achieve Goals: Good Progress towards PT goals: Progressing toward goals    Frequency    Min 2X/week      PT Plan Current plan remains appropriate    Co-evaluation              AM-PAC PT "6 Clicks" Mobility   Outcome Measure  Help needed turning from your back to your side while in a flat bed without using bedrails?: A Lot Help needed moving from lying on your back to sitting on the side of a flat bed without using bedrails?: A Lot Help needed moving to and from a bed to a chair (including a wheelchair)?: Total Help needed standing up from a chair using your arms (e.g., wheelchair or bedside chair)?: Total Help needed to walk in hospital room?: Total Help needed climbing 3-5 steps with a railing? : Total 6 Click Score: 8    End of Session Equipment Utilized During Treatment: Oxygen Activity Tolerance: Patient limited by pain Patient left: in bed;with bed alarm set Nurse Communication: Mobility status PT Visit Diagnosis: Muscle weakness (generalized) (M62.81);Pain Pain - Right/Left:  (bilat) Pain - part of body: Leg     Time: 9629-5284 PT Time Calculation (min) (ACUTE ONLY): 10 min  Charges:  $Therapeutic Activity: 8-22 mins                     Erik [name], PT, DPT, GCS [phone]    Erik [name] 09/07/2022, 12:09 PM

## 2022-09-07 NOTE — TOC Progression Note (Addendum)
Transition of Care [facility]) - Progression Note    Patient Details  Name: Erik [name] MRN: [id number] Date of Birth: 10-Oct-1955  Transition of Care [facility]) CM/SW [place],  Phone Number: 09/07/2022, 2:00 PM  Clinical Narrative:     [facility] updated on potential dc tomorrow per MD.  CSW has also called [employer] insurance to update on potential to dc tomorrow.  Expected Discharge Plan:  (TBD) Barriers to Discharge: No Barriers Identified  Expected Discharge Plan and Services       Living arrangements for the past 2 months: Single Family Home Expected Discharge Date: 09/07/22                                     Social Determinants of Health (SDOH) Interventions SDOH Screenings   Food Insecurity: No Food Insecurity (07/14/2022)  Housing: Low Risk  (07/14/2022)  Transportation Needs: No Transportation Needs (07/14/2022)  Utilities: Not At Risk (07/14/2022)  Alcohol Screen: Low Risk  (02/19/2020)  Depression (PHQ2-9): Low Risk  (08/25/2020)  Financial Resource Strain: Low Risk  (11/04/2020)  Tobacco Use: Medium Risk (09/01/2022)    Readmission Risk Interventions     No data to display

## 2022-09-07 NOTE — TOC Transition Note (Signed)
Transition of Care [facility]) - CM/SW Discharge Note   Patient Details  Name: Erik [name] MRN: [id number] Date of Birth: March 17, 1956  Transition of Care [facility]) CM/SW Contact:  Tiburcio Bash, LCSW Phone Number: 09/07/2022, 10:48 AM   Clinical Narrative:     Patient will DC to: [facility] Anticipated DC date: 09/07/22 Family notified: spouse [name] Transport by: Johnanna Schneiders  Per MD patient ready for DC to [facility]. RN, patient, patient's family, and facility notified of DC. Discharge Summary sent to facility. RN given number for report  [phone] Room 607A. DC packet on chart. Ambulance transport requested for patient.  CSW signing off.  Pricilla Riffle, LCSW    Final next level of care: Skilled Nursing Facility Barriers to Discharge: No Barriers Identified   Patient Goals and CMS Choice CMS Medicare.gov Compare Post Acute Care list provided to:: Patient Choice offered to / list presented to : Patient  Discharge Placement                Patient chooses bed at: [facility] [place]     Patient and family notified of of transfer: 09/07/22  Discharge Plan and Services Additional resources added to the After Visit Summary for                                       Social Determinants of Health (SDOH) Interventions SDOH Screenings   Food Insecurity: No Food Insecurity (07/14/2022)  Housing: Low Risk  (07/14/2022)  Transportation Needs: No Transportation Needs (07/14/2022)  Utilities: Not At Risk (07/14/2022)  Alcohol Screen: Low Risk  (02/19/2020)  Depression (PHQ2-9): Low Risk  (08/25/2020)  Financial Resource Strain: Low Risk  (11/04/2020)  Tobacco Use: Medium Risk (09/01/2022)     Readmission Risk Interventions     No data to display

## 2022-09-07 NOTE — Progress Notes (Signed)
Occupational Therapy Treatment Patient Details Name: Erik [name] MRN: [id number] DOB: [birth year] Today's Date: 09/07/2022   History of present illness Erik [name] is a 67 y.o. male with medical history significant of CAD s/p PCI, HFrEF, permanent A-fib not on AC, COPD on home 2 L O2, chronic venous stasis who presents to the ED after a ground-level fall.  Night before presentation, pt stated that he felt sudden onset worsening shortness of breath, dizziness and elevated heart rate.    Patient receiving inpatient treatment for A-fib, CAP, acute on chronic respiratory failure from COPD, rhabdomyolysis, and bilateral LE cellulitis.  Pt's head CT scan negative for acute findings, though patient experiencing R side weakness.   OT comments  Upon entering session, pt resting in bed and agreeable to OT with encouragement. Pt deferred OOB mobility 2/2 reporting that he already sat at EOB earlier today. Pt agreeable to bed level BUE AROM exercises using red theraband and printed HEP (see details below). Education provided re: energy conservation techniques including taking rest breaks and PLB. Pt able to self-initiate rest breaks in between sets of exercises. Min cues required for PLB. Pt then engaged in grooming tasks with set up A. On 3L O2 via [place]. SpO2 maintained >90% t/o session. Pt left as received with all needs in reach. Pt is making progress toward goal completion. D/C recommendation remains appropriate. OT will continue to follow acutely.    Recommendations for follow up therapy are one component of a multi-disciplinary discharge planning process, led by the attending physician.  Recommendations may be updated based on patient status, additional functional criteria and insurance authorization.    Follow Up Recommendations  Skilled nursing-short term rehab (<3 hours/day)     Assistance Recommended at Discharge Frequent or constant Supervision/Assistance  Patient can return home with the  following  Two people to help with walking and/or transfers;A lot of help with bathing/dressing/bathroom;Assistance with cooking/housework;Assist for transportation;Help with stairs or ramp for entrance   Equipment Recommendations  Tub/shower bench    Recommendations for Other Services      Precautions / Restrictions Precautions Precautions: Fall Precaution Comments: watch O2, HR Restrictions Weight Bearing Restrictions: No       Mobility Bed Mobility Overal bed mobility: Needs Assistance             General bed mobility comments: pt deferred    Transfers                         Balance Overall balance assessment: Needs assistance     Sitting balance - Comments: pt deferred                                   ADL either performed or assessed with clinical judgement   ADL Overall ADL's : Needs assistance/impaired     Grooming: Wash/dry hands;Wash/dry face;Set up;Bed level;Brushing hair                                      Extremity/Trunk Assessment Upper Extremity Assessment Upper Extremity Assessment: Generalized weakness   Lower Extremity Assessment Lower Extremity Assessment: Generalized weakness        Vision Baseline Vision/History: 1 Wears glasses Patient Visual Report: No change from baseline     Perception     Praxis  Cognition Arousal/Alertness: Awake/alert Behavior During Therapy: WFL for tasks assessed/performed Overall Cognitive Status: Within Functional Limits for tasks assessed                                          Exercises Other Exercises Other Exercises: Bed level UE AROM exercises using red theraband and printed HEP. x10 each - shoulder flexion, elbow flexion, elbow extension, shoulder horization adduction/abduction. Encouraged pt to complete 1-2 sets of exercises at least 3x/wk outside of therapy. Education provided re: energy conservation techniques including  taking rest breaks and PLB.    Shoulder Instructions       General Comments      Pertinent Vitals/ Pain       Pain Assessment Pain Assessment: Faces Faces Pain Scale: Hurts a little bit Pain Location: back Pain Descriptors / Indicators: Moaning, Grimacing, Guarding Pain Intervention(s): Limited activity within patient's tolerance, Monitored during session, Repositioned  Home Living                                          Prior Functioning/Environment              Frequency  Min 2X/week        Progress Toward Goals  OT Goals(current goals can now be found in the care plan section)  Progress towards OT goals: Progressing toward goals  Acute Rehab OT Goals Patient Stated Goal: to return home OT Goal Formulation: With patient Time For Goal Achievement: 09/12/22 Potential to Achieve Goals: [place] Discharge plan remains appropriate;Frequency remains appropriate    Co-evaluation                 AM-PAC OT "6 Clicks" Daily Activity     Outcome Measure   Help from another person eating meals?: None Help from another person taking care of personal grooming?: A Little Help from another person toileting, which includes using toliet, bedpan, or urinal?: Total Help from another person bathing (including washing, rinsing, drying)?: A Lot Help from another person to put on and taking off regular upper body clothing?: A Lot Help from another person to put on and taking off regular lower body clothing?: Total 6 Click Score: 13    End of Session Equipment Utilized During Treatment: Oxygen  OT Visit Diagnosis: Other abnormalities of gait and mobility (R26.89);Muscle weakness (generalized) (M62.81)   Activity Tolerance Patient tolerated treatment well;Patient limited by fatigue   Patient Left in bed;with call bell/phone within reach;with bed alarm set   Nurse Communication Mobility status        Time: 2423-5361 OT Time Calculation  (min): 15 min  Charges: OT General Charges $OT Visit: 1 Visit OT Treatments $Therapeutic Exercise: 8-22 mins  [facility] MS, OTR/L ascom [phone]  09/07/22, 5:43 PM

## 2022-09-07 NOTE — Assessment & Plan Note (Signed)
As above.  Now on aspirin and Plavix.  Status post stent placement.

## 2022-09-08 DIAGNOSIS — I10 Essential (primary) hypertension: Secondary | ICD-10-CM | POA: Diagnosis not present

## 2022-09-08 DIAGNOSIS — N3091 Cystitis, unspecified with hematuria: Secondary | ICD-10-CM | POA: Diagnosis not present

## 2022-09-08 DIAGNOSIS — J9621 Acute and chronic respiratory failure with hypoxia: Secondary | ICD-10-CM | POA: Diagnosis not present

## 2022-09-08 DIAGNOSIS — M160 Bilateral primary osteoarthritis of hip: Secondary | ICD-10-CM | POA: Diagnosis not present

## 2022-09-08 DIAGNOSIS — R58 Hemorrhage, not elsewhere classified: Secondary | ICD-10-CM | POA: Diagnosis not present

## 2022-09-08 DIAGNOSIS — M6259 Muscle wasting and atrophy, not elsewhere classified, multiple sites: Secondary | ICD-10-CM | POA: Diagnosis not present

## 2022-09-08 DIAGNOSIS — Z1152 Encounter for screening for COVID-19: Secondary | ICD-10-CM | POA: Diagnosis not present

## 2022-09-08 DIAGNOSIS — Z743 Need for continuous supervision: Secondary | ICD-10-CM | POA: Diagnosis not present

## 2022-09-08 DIAGNOSIS — I6523 Occlusion and stenosis of bilateral carotid arteries: Secondary | ICD-10-CM | POA: Diagnosis not present

## 2022-09-08 DIAGNOSIS — L03116 Cellulitis of left lower limb: Secondary | ICD-10-CM | POA: Diagnosis not present

## 2022-09-08 DIAGNOSIS — R0902 Hypoxemia: Secondary | ICD-10-CM | POA: Diagnosis not present

## 2022-09-08 DIAGNOSIS — I2510 Atherosclerotic heart disease of native coronary artery without angina pectoris: Secondary | ICD-10-CM | POA: Diagnosis not present

## 2022-09-08 DIAGNOSIS — E669 Obesity, unspecified: Secondary | ICD-10-CM | POA: Diagnosis not present

## 2022-09-08 DIAGNOSIS — I5022 Chronic systolic (congestive) heart failure: Secondary | ICD-10-CM | POA: Diagnosis not present

## 2022-09-08 DIAGNOSIS — I739 Peripheral vascular disease, unspecified: Secondary | ICD-10-CM | POA: Diagnosis not present

## 2022-09-08 DIAGNOSIS — Z7401 Bed confinement status: Secondary | ICD-10-CM | POA: Diagnosis not present

## 2022-09-08 DIAGNOSIS — R0689 Other abnormalities of breathing: Secondary | ICD-10-CM | POA: Diagnosis not present

## 2022-09-08 DIAGNOSIS — N3001 Acute cystitis with hematuria: Secondary | ICD-10-CM | POA: Diagnosis not present

## 2022-09-08 DIAGNOSIS — I4891 Unspecified atrial fibrillation: Secondary | ICD-10-CM | POA: Diagnosis not present

## 2022-09-08 DIAGNOSIS — Z7982 Long term (current) use of aspirin: Secondary | ICD-10-CM | POA: Diagnosis not present

## 2022-09-08 DIAGNOSIS — L03115 Cellulitis of right lower limb: Secondary | ICD-10-CM | POA: Diagnosis not present

## 2022-09-08 DIAGNOSIS — J449 Chronic obstructive pulmonary disease, unspecified: Secondary | ICD-10-CM | POA: Diagnosis not present

## 2022-09-08 DIAGNOSIS — R279 Unspecified lack of coordination: Secondary | ICD-10-CM | POA: Diagnosis not present

## 2022-09-08 DIAGNOSIS — I482 Chronic atrial fibrillation, unspecified: Secondary | ICD-10-CM | POA: Diagnosis not present

## 2022-09-08 DIAGNOSIS — J9611 Chronic respiratory failure with hypoxia: Secondary | ICD-10-CM | POA: Diagnosis not present

## 2022-09-08 DIAGNOSIS — R319 Hematuria, unspecified: Secondary | ICD-10-CM | POA: Diagnosis present

## 2022-09-08 DIAGNOSIS — E440 Moderate protein-calorie malnutrition: Secondary | ICD-10-CM | POA: Diagnosis not present

## 2022-09-08 DIAGNOSIS — R040 Epistaxis: Secondary | ICD-10-CM | POA: Diagnosis not present

## 2022-09-08 DIAGNOSIS — Z7902 Long term (current) use of antithrombotics/antiplatelets: Secondary | ICD-10-CM | POA: Diagnosis not present

## 2022-09-08 DIAGNOSIS — R262 Difficulty in walking, not elsewhere classified: Secondary | ICD-10-CM | POA: Diagnosis not present

## 2022-09-08 DIAGNOSIS — I700 Atherosclerosis of aorta: Secondary | ICD-10-CM | POA: Diagnosis not present

## 2022-09-08 DIAGNOSIS — R000 Tachycardia, unspecified: Secondary | ICD-10-CM | POA: Diagnosis not present

## 2022-09-08 DIAGNOSIS — W19XXXD Unspecified fall, subsequent encounter: Secondary | ICD-10-CM | POA: Diagnosis not present

## 2022-09-08 DIAGNOSIS — L98492 Non-pressure chronic ulcer of skin of other sites with fat layer exposed: Secondary | ICD-10-CM | POA: Diagnosis not present

## 2022-09-08 DIAGNOSIS — R5381 Other malaise: Secondary | ICD-10-CM | POA: Diagnosis not present

## 2022-09-08 DIAGNOSIS — R1312 Dysphagia, oropharyngeal phase: Secondary | ICD-10-CM | POA: Diagnosis not present

## 2022-09-08 DIAGNOSIS — L97919 Non-pressure chronic ulcer of unspecified part of right lower leg with unspecified severity: Secondary | ICD-10-CM | POA: Diagnosis not present

## 2022-09-08 DIAGNOSIS — L89612 Pressure ulcer of right heel, stage 2: Secondary | ICD-10-CM | POA: Diagnosis not present

## 2022-09-08 DIAGNOSIS — Z79899 Other long term (current) drug therapy: Secondary | ICD-10-CM | POA: Diagnosis not present

## 2022-09-08 DIAGNOSIS — J439 Emphysema, unspecified: Secondary | ICD-10-CM | POA: Diagnosis not present

## 2022-09-08 DIAGNOSIS — Z9049 Acquired absence of other specified parts of digestive tract: Secondary | ICD-10-CM | POA: Diagnosis not present

## 2022-09-08 DIAGNOSIS — I70213 Atherosclerosis of native arteries of extremities with intermittent claudication, bilateral legs: Secondary | ICD-10-CM | POA: Diagnosis not present

## 2022-09-08 DIAGNOSIS — I5023 Acute on chronic systolic (congestive) heart failure: Secondary | ICD-10-CM | POA: Diagnosis not present

## 2022-09-08 DIAGNOSIS — E782 Mixed hyperlipidemia: Secondary | ICD-10-CM | POA: Diagnosis not present

## 2022-09-08 DIAGNOSIS — I5020 Unspecified systolic (congestive) heart failure: Secondary | ICD-10-CM | POA: Diagnosis not present

## 2022-09-08 DIAGNOSIS — R29898 Other symptoms and signs involving the musculoskeletal system: Secondary | ICD-10-CM | POA: Diagnosis not present

## 2022-09-08 LAB — BASIC METABOLIC PANEL
Anion gap: 5 (ref 5–15)
BUN: 16 mg/dL (ref 8–23)
CO2: 36 mmol/L — ABNORMAL HIGH (ref 22–32)
Calcium: 8.3 mg/dL — ABNORMAL LOW (ref 8.9–10.3)
Chloride: 97 mmol/L — ABNORMAL LOW (ref 98–111)
Creatinine, Ser: 0.74 mg/dL (ref 0.61–1.24)
GFR, Estimated: >60 mL/min (ref >60)
Glucose, Bld: 110 mg/dL — ABNORMAL HIGH (ref 70–99)
Potassium: 4.7 mmol/L (ref 3.5–5.1)
Sodium: 138 mmol/L (ref 135–145)

## 2022-09-08 MED ORDER — TORSEMIDE 20 MG PO TABS: ORAL_TABLET | ORAL | 0 refills | Status: DC

## 2022-09-08 NOTE — TOC Transition Note (Addendum)
Transition of Care [facility]) - CM/SW Discharge Note   Patient Details  Name: Erik [name] MRN: [id number] Date of Birth: Jun 28, 1956  [facility]) CM/SW Contact:  Tiburcio Bash, LCSW Phone Number: 09/08/2022, 1:01 PM   Clinical Narrative:     Patient will DC to: [facility] Anticipated DC date: 09/08/22 Family notified: spouse [name] Transport by: Johnanna Schneiders   Per MD patient ready for DC to [facility]. RN, patient, patient's family, and facility notified of DC. Discharge Summary sent to facility. RN given number for report  [phone] . DC packet on chart. Ambulance transport requested for patient.  CSW signing off.   Pricilla Riffle, LCSW  Final next level of care: Skilled Nursing Facility Barriers to Discharge: No Barriers Identified   Patient Goals and CMS Choice CMS Medicare.gov Compare Post Acute Care list provided to:: Patient Choice offered to / list presented to : Patient  Discharge Placement                Patient chooses bed at: [facility] [place]     Patient and family notified of of transfer: 09/07/22  Discharge Plan and Services Additional resources added to the After Visit Summary for                                       Social Determinants of Health (SDOH) Interventions SDOH Screenings   Food Insecurity: No Food Insecurity (07/14/2022)  Housing: Low Risk  (07/14/2022)  Transportation Needs: No Transportation Needs (07/14/2022)  Utilities: Not At Risk (07/14/2022)  Alcohol Screen: Low Risk  (02/19/2020)  Depression (PHQ2-9): Low Risk  (08/25/2020)  Financial Resource Strain: Low Risk  (11/04/2020)  Tobacco Use: Medium Risk (09/01/2022)     Readmission Risk Interventions     No data to display

## 2022-09-08 NOTE — Discharge Summary (Signed)
Physician Discharge Summary   Patient: Erik [name] MRN: [id number] DOB: 06/08/1956  Admit date:     08/27/2022  Discharge date: 09/08/22  Discharge Physician: Annita Brod   PCP: Jonetta Osgood, NP   Recommendations at discharge:   Medication change: Lopressor increased from 50 mg p.o. twice daily to 100 mg p.o. twice daily New medication: MiraLAX 17 g p.o. daily New medication: Afrin spray 1 spray into each nostril twice a day, continue until packing has been removed Patient will follow-up with Dr. Anda Latina, ENT on 2/5 Medication change: Demadex increased to 40 mg p.o. twice daily x 3 days then back to regular dose of 40 mg daily Patient being discharged to skilled nursing New medication: Keflex 500 mg p.o. every 6 hours x 6 days New medication: Plavix 75 mg p.o. daily Medication change: Aspirin decreased from 325 to 81 mg p.o. daily Wound care as follows.  For left lower extremity, every other day, cleanse leg with soap and water rinse and dry.  Cover with silicone foam dressings as needed for exudate and change every other day.  For right lower extremity, cleanse with saline, pat dry and cover with single layer of Xeroform gauze and top with any dry dressing.  Wrap with Kerlix and Ace wrap from toes to knees and change every other day.  Also change as needed for any drainage extending through the exterior of the dressing.  Discharge Diagnoses: Principal Problem:   Atrial fibrillation with RVR ([place]) Active Problems:   Acute on chronic respiratory failure with hypoxia (HCC)   CAP (community acquired pneumonia)   COPD exacerbation (HCC)   Epistaxis   Bilateral cellulitis of lower leg   PAD (peripheral artery disease) (HCC)   Acute on chronic systolic (congestive) heart failure (HCC)   Right sided weakness   Obesity (BMI 30-39.9)  Resolved Problems:   Rhabdomyolysis  Hospital Course: 67 year old male with past medical history of CAD status post PCI, systolic  heart failure, atrial fibrillation and COPD on 2 L nasal cannula chronically who presented to the emergency room on 1/20 after a fall, which happened from worsening shortness of breath, dizziness and elevated heart rate starting the night before.  After the fall, patient landed on the ground and stayed on the ground for about 12 hours.  In the emergency room, noted to be in rapid atrial fibrillation and workup revealed pneumonia with acute on chronic respiratory failure requiring 6 L as well as worsening of chronic lower extremity wounds and rhabdomyolysis.  Patient treated with steroids, nebulizers and antibiotics and after several days, back to baseline.  Hospital course complicated by persistent nosebleed on 1/30 requiring ENT intervention.  On 1/31, BNP checked and found to be elevated close to 1000 and patient started on IV Lasix.  Assessment and Plan: * Atrial fibrillation with RVR (HCC) Rates as high as 152 in the ED.  He received one-time dose of metoprolol with no significant effect. Patient is not on AC due to prior refusal; CHA2DS2-VASc elevated at 4.  Weaned off of Cardizem drip.  Home Lopressor increased from 50 twice daily to 100 twice daily.  As he is further diuresed, heart rate should improve  Acute on chronic respiratory failure with hypoxia (HCC) Secondary to pneumonia and COPD exacerbation.  Normally on 2 L, presented requiring 6 L.  With treatment of pneumonia and COPD, oxygenation improved and is currently back down to baseline.  CAP (community acquired pneumonia) CT chest with left lower lobe opacity concerning  for pneumonia.  Differential includes aspiration pneumonia as patient's wife states he routinely chokes on his food.  Treated with full course of antibiotics.  Seen by speech therapy and recommended on general aspiration precautions, but recommendation overall is for a mechanical soft diet with thin liquids  COPD exacerbation (HCC) Treated with nebulizers and steroids.   Antibiotics for pneumonia as above.  Oxygenation improved and by time of discharge, patient back to baseline.    Epistaxis Following stent placement, patient has been on aspirin Plavix plus Lovenox for DVT prophylaxis.  On night of 1/30, developed severe continuous nosebleed.  Unrelenting and ENT consulted.  Cottonoid pledget along with phenylephrine lidocaine solution placed in right nostril for 6 minutes.  Small area of bleeding and right anterior septum cauterized with silver nitrate.  Some significant edema and erythema and Merocel sponge placed with bacitracin ointment and bandage with oxymetazoline.  ENT recommended keeping packing in place until they see patient on 2/5.  Patient also put on Keflex for this time.  Bilateral cellulitis of lower leg Patient has a history of bilateral lower extremity cellulitis with admission in December 2023.  Per images taken during that admission, patient's lower extremities, including the wound on the lateral aspect of the right leg, appears significantly worsened.  There is evidence of purulent drainage.    Patient placed on IV antibiotics.  No evidence of osteomyelitis.  ABIs noted significant evidence of worsening PAD.  Vascular surgery consulted and patient underwent recannulization of right lower extremity for limb salvage with stent placement on 1/24.  Previously had been on aspirin only, changed to aspirin and Plavix which will be continued.  Patient will follow-up with vascular surgery as outpatient.  - Start vancomycin per pharmacy dosing - Obtain ABIs  - Will obtain a right tib-fib x-ray to evaluate for osteomyelitis  PAD (peripheral artery disease) ([place]) As above.  Now on aspirin and Plavix.  Status post stent placement.  Acute on chronic systolic (congestive) heart failure (HCC) Last echocardiogram in June 2023 with EF of 45-50%.  BNP on admission at 548.  Home torsemide held.  BMP rechecked on 1/31 and elevated at 900.  Patient started on IV  Lasix.  He responded to this diuresing several liters.  Plan is for patient be discharged with an increased dose of Demadex, normally on 40 mg daily.  He will be discharged on 40 mg twice a day for 3 days and then back to his home dose.  Right sided weakness Patient's wife states he has been leaning towards the right for several days prior to admission.  MRI negative for CVA and symptoms felt to be secondary to PAD and deconditioning.  Status post stent placement as above.  Seen by PT and OT who are recommending skilled nursing.    Rhabdomyolysis-resolved as of 09/07/2022 Treated with IV fluids.  No renal dysfunction.  Resolved.  Obesity (BMI 30-39.9) Meets criteria with BMI greater than 30        Pain control - [place] Controlled Substance Reporting System database was reviewed. and patient was instructed, not to drive, operate heavy machinery, perform activities at heights, swimming or participation in water activities or provide baby-sitting services while on Pain, Sleep and Anxiety Medications; until their outpatient Physician has advised to do so again. Also recommended to not to take more than prescribed Pain, Sleep and Anxiety Medications.  Consultants:  -ENT Vascular surgery   Procedures performed:  -Status post angiogram of the right lower extremity and stent placement  of the right superficial femoral artery and popliteal artery with percutaneous transluminal angioplasty and stent placement of the right common iliac artery -Treatment of nosebleed with silver nitrate and wound packing Disposition: Skilled nursing Diet recommendation:  Discharge Diet Orders (From admission, onward)     Start     Ordered   09/07/22 0000  Diet - low sodium heart healthy        09/07/22 1051           Heart healthy mechanical soft with thin liquids DISCHARGE MEDICATION: Allergies as of 09/08/2022       Reactions   Benadryl [diphenhydramine Hcl] Shortness Of Breath   Morphine And  Related Swelling   Difficulty breathing per patient    Oxycodone Swelling   Severe mouth swelling requiring medical intervention        Medication List     STOP taking these medications    gabapentin 100 MG capsule Commonly known as: NEURONTIN       TAKE these medications    acetaminophen 325 MG tablet Commonly known as: TYLENOL Take 2 tablets (650 mg total) by mouth every 6 (six) hours as needed for mild pain (or Fever >/= 101).   albuterol 108 (90 Base) MCG/ACT inhaler Commonly known as: VENTOLIN HFA Inhale 1 puff into the lungs every 6 (six) hours as needed for wheezing or shortness of breath.   aspirin EC 81 MG tablet Take 1 tablet (81 mg total) by mouth daily. Swallow whole. What changed:  medication strength how much to take additional instructions   cephALEXin 500 MG capsule Commonly known as: KEFLEX Take 1 capsule (500 mg total) by mouth every 6 (six) hours for 6 days.   clopidogrel 75 MG tablet Commonly known as: PLAVIX Take 1 tablet (75 mg total) by mouth daily.   hydrOXYzine 25 MG tablet Commonly known as: ATARAX Take 1 tablet (25 mg total) by mouth 3 (three) times daily as needed for anxiety.   ipratropium-albuterol 0.5-2.5 (3) MG/3ML Soln Commonly known as: DUONEB USE 1 VIAL VIA NEBULIZER EVERY 6 HOURS   leptospermum manuka honey Pste paste Apply 1 Application topically daily.   metoprolol tartrate 100 MG tablet Commonly known as: LOPRESSOR Take 1 tablet (100 mg total) by mouth 2 (two) times daily. What changed:  medication strength See the new instructions.   oxymetazoline 0.05 % nasal spray Commonly known as: AFRIN Place 1 spray into both nostrils 2 (two) times daily.   polyethylene glycol 17 g packet Commonly known as: MIRALAX / GLYCOLAX Take 17 g by mouth daily.   torsemide 20 MG tablet Commonly known as: DEMADEX Take 2 tablets (40 mg total) by mouth 2 (two) times daily for 3 days, THEN 2 tablets (40 mg total) daily. Start  taking on: September 08, 2022 What changed: See the new instructions.   Trelegy Ellipta 100-62.5-25 MCG/ACT Aepb Generic drug: Fluticasone-Umeclidin-Vilant INHALE 1 PUFF BY MOUTH INTO LUNGS DAILY               Discharge Care Instructions  (From admission, onward)           Start     Ordered   09/07/22 0000  Discharge wound care:       Comments: Wound care to LLE scattered wounds:  Cleanse LLE with soap and water, rinse and dry. Cover with silicone foam dressings as needed for exudate. Change every other day.   Wound care  Every other day    Comments: Cleanse wound RLE with saline,  pat dry Cover with single layer of xeroform gauze and top with dry dressing Wrap with kerlix and ACE wrap from toes to knees.  Change every other day and PRN drainage strikethrough onto exterior of dressing.   09/07/22 1051            Discharge Exam: Filed Weights   08/31/22 0412 09/07/22 0849 09/08/22 0850  Weight: 110.8 kg 111.1 kg 111.3 kg   General: Alert and oriented x 3, no acute distress Cardiovascular: Irregular rhythm, rate controlled   Condition at discharge: good  The results of significant diagnostics from this hospitalization (including imaging, microbiology, ancillary and laboratory) are listed below for reference.   Imaging Studies: PERIPHERAL VASCULAR CATHETERIZATION  Result Date: 08/31/2022 See surgical note for result.  ECHOCARDIOGRAM COMPLETE  Result Date: 08/30/2022    ECHOCARDIOGRAM REPORT   Patient Name:   Erik [name] Date of Exam: 08/30/2022 Medical Rec #:  [id number]      Height:       70.0 in Accession #:    [id number]     Weight:       238.1 lb Date of Birth:  [birth year]     BSA:          2.248 m Patient Age:    68 years       BP:           116/76 mmHg Patient Gender: M              HR:           110 bpm. Exam Location:  [facility] Procedure: 2D Echo Indications:     pulmonary embolus  History:         Patient has prior history of Echocardiogram examinations,  most                  recent 01/14/2022. CHF, CAD, COPD, Arrythmias:Atrial                  Fibrillation; Risk Factors:Dyslipidemia and Hypertension.  Sonographer:     Harvie Junior Referring Phys:  [id number] [place] Diagnosing Phys: Kathlyn Sacramento MD  Sonographer Comments: Technically difficult study due to poor echo windows, patient is obese and no subcostal window. Image acquisition challenging due to patient body habitus and supine. IMPRESSIONS  1. Left ventricular ejection fraction, by estimation, is 35 to 40%. The left ventricle has moderately decreased function. Left ventricular endocardial border not optimally defined to evaluate regional wall motion. Left ventricular diastolic parameters are indeterminate.  2. Right ventricular systolic function is normal. The right ventricular size is mildly enlarged. There is moderately elevated pulmonary artery systolic pressure.  3. Left atrial size was mildly dilated.  4. The mitral valve is abnormal. Mild to moderate mitral valve regurgitation. No evidence of mitral stenosis. Moderate mitral annular calcification.  5. The aortic valve is normal in structure. Aortic valve regurgitation is trivial. Aortic valve sclerosis/calcification is present, without any evidence of aortic stenosis.  6. The inferior vena cava is dilated in size with <50% respiratory variability, suggesting right atrial pressure of 15 mmHg. FINDINGS  Left Ventricle: Left ventricular ejection fraction, by estimation, is 35 to 40%. The left ventricle has moderately decreased function. Left ventricular endocardial border not optimally defined to evaluate regional wall motion. The left ventricular internal cavity size was normal in size. There is no left ventricular hypertrophy. Left ventricular diastolic parameters are indeterminate. Right Ventricle: The right ventricular size is mildly enlarged. No increase in right ventricular  wall thickness. Right ventricular systolic function is normal. There is  moderately elevated pulmonary artery systolic pressure. The tricuspid regurgitant velocity is 2.91 m/s, and with an assumed right atrial pressure of 15 mmHg, the estimated right ventricular systolic pressure is [id number] mmHg. Left Atrium: Left atrial size was mildly dilated. Right Atrium: Right atrial size was normal in size. Pericardium: There is no evidence of pericardial effusion. Mitral Valve: The mitral valve is abnormal. There is mild thickening of the mitral valve leaflet(s). There is moderate calcification of the mitral valve leaflet(s). Moderate mitral annular calcification. Mild to moderate mitral valve regurgitation. No evidence of mitral valve stenosis. Tricuspid Valve: The tricuspid valve is normal in structure. Tricuspid valve regurgitation is mild . No evidence of tricuspid stenosis. Aortic Valve: The aortic valve is normal in structure. Aortic valve regurgitation is trivial. Aortic regurgitation PHT measures 498 msec. Aortic valve sclerosis/calcification is present, without any evidence of aortic stenosis. Aortic valve mean gradient  measures 4.6 mmHg. Aortic valve peak gradient measures 8.1 mmHg. Aortic valve area, by VTI measures 1.23 cm. Pulmonic Valve: The pulmonic valve was normal in structure. Pulmonic valve regurgitation is not visualized. No evidence of pulmonic stenosis. Aorta: The aortic root is normal in size and structure. Venous: The inferior vena cava is dilated in size with less than 50% respiratory variability, suggesting right atrial pressure of 15 mmHg. IAS/Shunts: No atrial level shunt detected by color flow Doppler.  LEFT VENTRICLE PLAX 2D LVIDd:         4.40 cm      Diastology LVIDs:         3.50 cm      LV e' medial:    13.27 cm/s LV PW:         1.00 cm      LV E/e' medial:  10.2 LV IVS:        1.00 cm      LV e' lateral:   11.60 cm/s LVOT diam:     1.80 cm      LV E/e' lateral: 11.6 LV SV:         28 LV SV Index:   12 LVOT Area:     2.54 cm                              3D Volume  EF: LV Volumes (MOD)            3D EF:        48 % LV vol d, MOD A2C: 96.9 ml  LV EDV:       217 ml LV vol d, MOD A4C: 111.6 ml LV ESV:       113 ml LV vol s, MOD A2C: 54.1 ml  LV SV:        104 ml LV vol s, MOD A4C: 72.1 ml LV SV MOD A2C:     42.8 ml LV SV MOD A4C:     111.6 ml LV SV MOD BP:      46.4 ml RIGHT VENTRICLE RV Basal diam:  5.40 cm RV Mid diam:    4.30 cm RV S prime:     8.22 cm/s TAPSE (M-mode): 1.4 cm LEFT ATRIUM             Index        RIGHT ATRIUM           Index LA diam:  4.60 cm 2.05 cm/m   RA Area:     28.40 cm LA Vol (A2C):   79.7 ml 35.46 ml/m  RA Volume:   101.00 ml 44.93 ml/m LA Vol (A4C):   65.2 ml 29.01 ml/m LA Biplane Vol: 74.8 ml 33.28 ml/m  AORTIC VALVE                    PULMONIC VALVE AV Area (Vmax):    1.18 cm     PV Vmax:       1.08 m/s AV Area (Vmean):   1.12 cm     PV Peak grad:  4.7 mmHg AV Area (VTI):     1.23 cm AV Vmax:           142.60 cm/s AV Vmean:          99.000 cm/s AV VTI:            0.225 m AV Peak Grad:      8.1 mmHg AV Mean Grad:      4.6 mmHg LVOT Vmax:         66.10 cm/s LVOT Vmean:        43.525 cm/s LVOT VTI:          0.108 m LVOT/AV VTI ratio: 0.48 AI PHT:            498 msec  AORTA Ao Root diam: 3.30 cm MITRAL VALVE                TRICUSPID VALVE MV Area (PHT): 4.09 cm     TR Peak grad:   33.9 mmHg MV Decel Time: 186 msec     TR Vmax:        291.00 cm/s MR Peak grad: 65.9 mmHg MR Vmax:      405.80 cm/s   SHUNTS MV E velocity: 134.67 cm/s  Systemic VTI:  0.11 m                             Systemic Diam: 1.80 cm Kathlyn Sacramento MD Electronically signed by Kathlyn Sacramento MD Signature Date/Time: 08/30/2022/4:52:58 PM    Final    US ARTERIAL ABI (SCREENING LOWER EXTREMITY)  Result Date: 08/28/2022 CLINICAL DATA:  Left leg wound EXAM: NONINVASIVE PHYSIOLOGIC VASCULAR STUDY OF BILATERAL LOWER EXTREMITIES TECHNIQUE: Evaluation of both lower extremities were performed at rest, including calculation of ankle-brachial indices with single level pressure  measurements and doppler recording. COMPARISON:  None available. FINDINGS: Right ABI:  1.15 Left ABI:  1.13 Right Lower Extremity: Monophasic dorsalis pedis and posterior tibial artery waveforms. Left Lower Extremity: Monophasic posterior tibial and dorsalis pedis artery waveforms. IMPRESSION: Although ankle-brachial indices are within normal limits, there are monophasic bilateral lower extremity arterial waveforms which suggest that there is underlying arterial occlusive disease. If clinical suspicion remains high for arterial occlusive disease, further evaluation with CT angiography runoff of the lower extremity should be performed. Electronically Signed   By: Miachel Roux M.D.   On: 08/28/2022 19:58   MR BRAIN WO CONTRAST  Result Date: 08/27/2022 CLINICAL DATA:  Neuro deficit, acute, stroke suspected EXAM: MRI HEAD WITHOUT CONTRAST TECHNIQUE: Multiplanar, multiecho pulse sequences of the brain and surrounding structures were obtained without intravenous contrast. COMPARISON:  CT head from the same day. FINDINGS: Brain: No acute infarction, hemorrhage, hydrocephalus, extra-axial collection or mass lesion. Remote right frontal infarct with associated encephalomalacia and gliosis. Additional scattered T2/FLAIR hyperintensities in the white matter  are compatible with chronic microvascular ischemic disease. Cerebral atrophy with ex vacuo ventricular dilation. Callosal angle is within normal limits. Vascular: Major arterial flow voids are maintained at the skull base. Skull and upper cervical spine: Normal marrow signal. Sinuses/Orbits: Clear sinuses.  No acute orbital findings. Other: No mastoid effusions. IMPRESSION: 1. No evidence of acute intracranial abnormality. 2. 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 3.  Cerebral Atrophy (ICD10-G31.9). Electronically Signed   By: Margaretha Sheffield M.D.   On: 08/27/2022 19:27   CT Head Wo Contrast  Result Date: 08/27/2022 CLINICAL DATA:   Trauma. EXAM: CT HEAD WITHOUT CONTRAST CT CERVICAL SPINE WITHOUT CONTRAST TECHNIQUE: Multidetector CT imaging of the head and cervical spine was performed following the standard protocol without intravenous contrast. Multiplanar CT image reconstructions of the cervical spine were also generated. RADIATION DOSE REDUCTION: This exam was performed according to the departmental dose-optimization program which includes automated exposure control, adjustment of the mA and/or kV according to patient size and/or use of iterative reconstruction technique. COMPARISON:  None Available. FINDINGS: CT HEAD FINDINGS Brain: Ventricles and sulci are appropriate for patient's age. Periventricular and subcortical white matter hypodensities compatible with chronic microvascular ischemic changes. Encephalomalacia within the anterior right frontal lobe suggestive of chronic infarct. Additionally within the right posterior parietal lobe there is focal encephalomalacia suggestive of a chronic infarct. No evidence for acute cortically based infarct, intracranial hemorrhage, mass lesion or mass-effect. Vascular: No hyperdense vessel or unexpected calcification. Skull: Normal. Negative for fracture or focal lesion. Sinuses/Orbits: Paranasal sinuses are well aerated. Mastoid air cells are unremarkable. Orbits are unremarkable. Other: None CT CERVICAL SPINE FINDINGS Alignment: Straightening of the normal cervical lordosis. Skull base and vertebrae: Intact. Soft tissues and spinal canal: No prevertebral fluid or swelling. No visible canal hematoma. Disc levels: No acute fracture. Degenerative disc disease most pronounced C5-6. Left C2-3 facet fusion. Left C3-4 facet degenerative changes. Upper chest: Apical emphysematous change. Other: None IMPRESSION: 1. No acute intracranial findings. 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 2. No acute cervical spine fracture. Multilevel degenerative changes. Electronically Signed   By:  Lovey Newcomer M.D.   On: 08/27/2022 15:52   CT Cervical Spine Wo Contrast  Result Date: 08/27/2022 CLINICAL DATA:  Trauma. EXAM: CT HEAD WITHOUT CONTRAST CT CERVICAL SPINE WITHOUT CONTRAST TECHNIQUE: Multidetector CT imaging of the head and cervical spine was performed following the standard protocol without intravenous contrast. Multiplanar CT image reconstructions of the cervical spine were also generated. RADIATION DOSE REDUCTION: This exam was performed according to the departmental dose-optimization program which includes automated exposure control, adjustment of the mA and/or kV according to patient size and/or use of iterative reconstruction technique. COMPARISON:  None Available. FINDINGS: CT HEAD FINDINGS Brain: Ventricles and sulci are appropriate for patient's age. Periventricular and subcortical white matter hypodensities compatible with chronic microvascular ischemic changes. Encephalomalacia within the anterior right frontal lobe suggestive of chronic infarct. Additionally within the right posterior parietal lobe there is focal encephalomalacia suggestive of a chronic infarct. No evidence for acute cortically based infarct, intracranial hemorrhage, mass lesion or mass-effect. Vascular: No hyperdense vessel or unexpected calcification. Skull: Normal. Negative for fracture or focal lesion. Sinuses/Orbits: Paranasal sinuses are well aerated. Mastoid air cells are unremarkable. Orbits are unremarkable. Other: None CT CERVICAL SPINE FINDINGS Alignment: Straightening of the normal cervical lordosis. Skull base and vertebrae: Intact. Soft tissues and spinal canal: No prevertebral fluid or swelling. No visible canal hematoma. Disc levels: No acute fracture. Degenerative disc disease most pronounced  C5-6. Left C2-3 facet fusion. Left C3-4 facet degenerative changes. Upper chest: Apical emphysematous change. Other: None IMPRESSION: 1. No acute intracranial findings. Chronic microvascular ischemic changes and  chronic infarcts. 2. No acute cervical spine fracture. Multilevel degenerative changes. Electronically Signed   By: Lovey Newcomer M.D.   On: 08/27/2022 15:52   CT CHEST ABDOMEN PELVIS W CONTRAST  Result Date: 08/27/2022 CLINICAL DATA:  Sepsis. EXAM: CT CHEST, ABDOMEN, AND PELVIS WITH CONTRAST TECHNIQUE: Multidetector CT imaging of the chest, abdomen and pelvis was performed following the standard protocol during bolus administration of intravenous contrast. RADIATION DOSE REDUCTION: This exam was performed according to the departmental dose-optimization program which includes automated exposure control, adjustment of the mA and/or kV according to patient size and/or use of iterative reconstruction technique. CONTRAST:  158mL OMNIPAQUE IOHEXOL 300 MG/ML  SOLN COMPARISON:  Abdomen and pelvis CT 09/04/2007. Abdomen CT 03/25/2016. Chest CT 09/09/2019. FINDINGS: CT CHEST FINDINGS Cardiovascular: Heart is enlarged. No pericardial effusion. Coronary artery calcification is evident. Mild atherosclerotic calcification is noted in the wall of the thoracic aorta. Enlargement of the pulmonary outflow tract/main pulmonary arteries suggests pulmonary arterial hypertension. Mediastinum/Nodes: Borderline lymphadenopathy in the subcarinal station, only minimally progressive since 09/09/2019. There is no hilar lymphadenopathy. The esophagus has normal imaging features. There is no axillary lymphadenopathy. Lungs/Pleura: Centrilobular and paraseptal emphysema evident. Bullous changes noted in the lung apices. 9 x 5 mm posterior right upper lobe pulmonary nodule on 74/5 is stable comparing back to 09/09/2019 consistent with benign etiology. No followup imaging is recommended. Patchy airspace disease in the posterior left lower lobe is associated with mild volume loss. Imaging features are suspicious for pneumonia with similar dependent atelectasis in the deep costophrenic sulci bilaterally. There is a tiny left pleural effusion with  trace fluid in the cranial aspect of the major fissure. Musculoskeletal: No worrisome lytic or sclerotic osseous abnormality. CT ABDOMEN PELVIS FINDINGS Hepatobiliary: No suspicious focal abnormality within the liver parenchyma. Cholecystectomy. No intrahepatic or extrahepatic biliary dilation. Pancreas: No focal mass lesion. No dilatation of the main duct. No intraparenchymal cyst. No peripancreatic edema. Spleen: No splenomegaly. No focal mass lesion. Adrenals/Urinary Tract: No adrenal nodule or mass. Kidneys unremarkable. No evidence for hydroureter. The urinary bladder appears normal for the degree of distention. Stomach/Bowel: Stomach is unremarkable. No gastric wall thickening. No evidence of outlet obstruction. Duodenum is normally positioned as is the ligament of Treitz. No small bowel wall thickening. No small bowel dilatation. The terminal ileum is normal. The appendix is normal. No gross colonic mass. No colonic wall thickening. Vascular/Lymphatic: There is moderate atherosclerotic calcification of the abdominal aorta without aneurysm. There is no gastrohepatic or hepatoduodenal ligament lymphadenopathy. No retroperitoneal or mesenteric lymphadenopathy. 8 mm left common iliac node is stable in the interval, likely reactive. 9 mm short axis right external iliac node on 96/3 is stable comparing back to the 2009 exam consistent with benign reactive etiology. Stable right external iliac node at upper normal for size. Reproductive: The prostate gland and seminal vesicles are unremarkable. Other: No intraperitoneal free fluid. Musculoskeletal: Small bilateral groin hernias contain only fat. Advanced degenerative changes are noted in both hips. No worrisome lytic or sclerotic osseous abnormality. Superior endplate compression deformity noted at L2, L4, and L5. IMPRESSION: 1. Patchy airspace disease in the posterior left lower lobe is associated with mild volume loss. Imaging features are suspicious for  pneumonia. There is associated symmetric dependent atelectasis in the deep costophrenic sulci bilaterally. 2. Tiny left pleural effusion with trace fluid  in the cranial aspect of the major fissure. 3. Enlargement of the pulmonary outflow tract/main pulmonary arteries suggests pulmonary arterial hypertension. 4. No acute findings in the abdomen or pelvis. 5. Small bilateral groin hernias contain only fat. 6. Superior endplate compression deformity at L2, L4, and L5. 7. Advanced changes of emphysema with bullous disease in the upper lungs. 8. Aortic Atherosclerosis (ICD10-I70.0) and Emphysema (ICD10-J43.9). Electronically Signed   By: Misty Stanley M.D.   On: 08/27/2022 15:51   DG Tibia/Fibula Right  Result Date: 08/27/2022 CLINICAL DATA:  Right lower leg infection EXAM: RIGHT TIBIA AND FIBULA - 2 VIEW COMPARISON:  None Available. FINDINGS: Soft tissue defect noted laterally in the right calf. No acute bony abnormality. Specifically, no fracture, subluxation, or dislocation. No bone destruction to suggest osteomyelitis. No soft tissue gas. IMPRESSION: No acute bony abnormality. Electronically Signed   By: Rolm Baptise M.D.   On: 08/27/2022 14:25   DG Chest Portable 1 View  Result Date: 08/27/2022 CLINICAL DATA:  Shortness of breath EXAM: PORTABLE CHEST 1 VIEW COMPARISON:  07/13/2022 FINDINGS: Cardiomegaly. No confluent airspace opacities, effusions or edema. No acute bony abnormality. Aortic atherosclerosis. IMPRESSION: Cardiomegaly.  No active disease. Electronically Signed   By: Rolm Baptise M.D.   On: 08/27/2022 14:25    Microbiology: Results for orders placed or performed during the hospital encounter of 08/27/22  Culture, blood (Routine x 2)     Status: None   Collection Time: 08/27/22  1:56 PM   Specimen: Right Antecubital; Blood  Result Value Ref Range Status   Specimen Description RIGHT ANTECUBITAL  Final   Special Requests   Final    BOTTLES DRAWN AEROBIC AND ANAEROBIC Blood Culture adequate  volume   Culture   Final    NO GROWTH 5 DAYS Performed at [facility], [address] [place], [place] [address]    Report Status 09/01/2022 FINAL  Final  Culture, blood (Routine x 2)     Status: None   Collection Time: 08/27/22  2:22 PM   Specimen: BLOOD  Result Value Ref Range Status   Specimen Description BLOOD LEFT ANTECUBITAL  Final   Special Requests   Final    BOTTLES DRAWN AEROBIC AND ANAEROBIC Blood Culture adequate volume   Culture   Final    NO GROWTH 5 DAYS Performed at [facility], [address] [place], [place] [address]    Report Status 09/01/2022 FINAL  Final  Resp panel by RT-PCR (RSV, Flu A&B, Covid) Anterior Nasal Swab     Status: None   Collection Time: 08/27/22  6:11 PM   Specimen: Anterior Nasal Swab  Result Value Ref Range Status   SARS Coronavirus 2 by RT PCR NEGATIVE NEGATIVE Final    Comment: (NOTE) SARS-CoV-2 target nucleic acids are NOT DETECTED.  The SARS-CoV-2 RNA is generally detectable in upper respiratory specimens during the acute phase of infection. The lowest concentration of SARS-CoV-2 viral copies this assay can detect is 138 copies/mL. A negative result does not preclude SARS-Cov-2 infection and should not be used as the sole basis for treatment or other patient management decisions. A negative result may occur with  improper specimen collection/handling, submission of specimen other than nasopharyngeal swab, presence of viral mutation(s) within the areas targeted by this assay, and inadequate number of viral copies(<138 copies/mL). A negative result must be combined with clinical observations, patient history, and epidemiological information. The expected result is Negative.  Fact Sheet for Patients:  EntrepreneurPulse.com.au  Fact Sheet  for Healthcare Providers:  SeriousBroker.it  This test is no t yet approved or cleared by the [place] and  has been  authorized for detection and/or diagnosis of SARS-CoV-2 by FDA under an Emergency Use Authorization (EUA). This EUA will remain  in effect (meaning this test can be used) for the duration of the COVID-19 declaration under Section 564(b)(1) of the Act, 21 U.S.C.section 360bbb-3(b)(1), unless the authorization is terminated  or revoked sooner.       Influenza A by PCR NEGATIVE NEGATIVE Final   Influenza B by PCR NEGATIVE NEGATIVE Final    Comment: (NOTE) The Xpert Xpress SARS-CoV-2/FLU/RSV plus assay is intended as an aid in the diagnosis of influenza from Nasopharyngeal swab specimens and should not be used as a sole basis for treatment. Nasal washings and aspirates are unacceptable for Xpert Xpress SARS-CoV-2/FLU/RSV testing.  Fact Sheet for Patients: BloggerCourse.com  Fact Sheet for Healthcare Providers: SeriousBroker.it  This test is not yet approved or cleared by the [place] FDA and has been authorized for detection and/or diagnosis of SARS-CoV-2 by FDA under an Emergency Use Authorization (EUA). This EUA will remain in effect (meaning this test can be used) for the duration of the COVID-19 declaration under Section 564(b)(1) of the Act, 21 U.S.C. section 360bbb-3(b)(1), unless the authorization is terminated or revoked.     Resp Syncytial Virus by PCR NEGATIVE NEGATIVE Final    Comment: (NOTE) Fact Sheet for Patients: BloggerCourse.com  Fact Sheet for Healthcare Providers: SeriousBroker.it  This test is not yet approved or cleared by the [place] FDA and has been authorized for detection and/or diagnosis of SARS-CoV-2 by FDA under an Emergency Use Authorization (EUA). This EUA will remain in effect (meaning this test can be used) for the duration of the COVID-19 declaration under Section 564(b)(1) of the Act, 21 U.S.C. section 360bbb-3(b)(1), unless the  authorization is terminated or revoked.  Performed at [facility], [address] [place], [place] [address]   Respiratory (~20 pathogens) panel by PCR     Status: None   Collection Time: 08/27/22  6:11 PM   Specimen: Nasopharyngeal Swab; Respiratory  Result Value Ref Range Status   Adenovirus NOT DETECTED NOT DETECTED Final   Coronavirus 229E NOT DETECTED NOT DETECTED Final    Comment: (NOTE) The Coronavirus on the Respiratory Panel, DOES NOT test for the novel  Coronavirus (2019 nCoV)    Coronavirus HKU1 NOT DETECTED NOT DETECTED Final   Coronavirus NL63 NOT DETECTED NOT DETECTED Final   Coronavirus OC43 NOT DETECTED NOT DETECTED Final   Metapneumovirus NOT DETECTED NOT DETECTED Final   Rhinovirus / Enterovirus NOT DETECTED NOT DETECTED Final   Influenza A NOT DETECTED NOT DETECTED Final   Influenza B NOT DETECTED NOT DETECTED Final   Parainfluenza Virus 1 NOT DETECTED NOT DETECTED Final   Parainfluenza Virus 2 NOT DETECTED NOT DETECTED Final   Parainfluenza Virus 3 NOT DETECTED NOT DETECTED Final   Parainfluenza Virus 4 NOT DETECTED NOT DETECTED Final   Respiratory Syncytial Virus NOT DETECTED NOT DETECTED Final   Bordetella pertussis NOT DETECTED NOT DETECTED Final   Bordetella Parapertussis NOT DETECTED NOT DETECTED Final   Chlamydophila pneumoniae NOT DETECTED NOT DETECTED Final   Mycoplasma pneumoniae NOT DETECTED NOT DETECTED Final    Comment: Performed at [facility], [address] [place], [place] [address]    Labs: CBC: Recent Labs  Lab 09/04/22 0458 09/05/22 0550 09/05/22 1753 09/06/22 0647 09/07/22 0135  WBC 20.3*  14.6* 12.9* 11.3* 12.1*  HGB 13.5 14.9 13.8 13.0 12.8*  HCT 45.4 49.0 45.5 43.1 41.2  MCV 98.5 96.1 96.0 95.4 93.8  PLT 273 510* 496* 453* [id number]*   Basic Metabolic Panel: Recent Labs  Lab 09/03/22 0614 09/04/22 0458 09/05/22 0550 09/06/22 0647 09/07/22 0135 09/08/22 0635  NA 137 138 141 137 137 138  K 4.6 4.9 4.5  4.1 4.4 4.7  CL 98 100 100 98 99 97*  CO2 30 31 35* 35* 32 36*  GLUCOSE 150* 203* 109* 145* 134* 110*  BUN 14 14 20 21 19 16   CREATININE 0.75 0.81 0.73 0.71 0.71 0.74  CALCIUM 8.3* 8.2* 8.6* 7.7* 8.0* 8.3*  MG 2.1 2.2 2.2 2.1 2.1  --    Liver Function Tests: Recent Labs  Lab 09/03/22 0614 09/04/22 0458 09/05/22 0550 09/06/22 0647 09/07/22 0135  AST 27 38 77* 46* 34  ALT 44 39 89* 70* 57*  ALKPHOS 86 87 96 86 91  BILITOT 0.6 1.1 0.9 0.6 0.7  PROT 6.1* 5.8* 5.7* 5.0* 5.2*  ALBUMIN 2.7* 2.5* 2.6* 2.5* 2.6*   CBG: Recent Labs  Lab 09/05/22 1311 09/05/22 1721 09/05/22 2038 09/06/22 0016 09/06/22 0443  GLUCAP 148* 93 193* 126* 91    Discharge time spent: less than 30 minutes.  Signed: Annita Brod, MD [facility] 09/08/2022

## 2022-09-12 ENCOUNTER — Telehealth: Payer: Self-pay | Admitting: Nurse Practitioner

## 2022-09-12 DIAGNOSIS — R040 Epistaxis: Secondary | ICD-10-CM | POA: Diagnosis not present

## 2022-09-12 NOTE — Telephone Encounter (Signed)
Faxed 08/26/22 verbal SOC back to [place]; [phone]. Scanned-Toni

## 2022-09-15 DIAGNOSIS — I5022 Chronic systolic (congestive) heart failure: Secondary | ICD-10-CM | POA: Diagnosis not present

## 2022-09-15 DIAGNOSIS — L97919 Non-pressure chronic ulcer of unspecified part of right lower leg with unspecified severity: Secondary | ICD-10-CM | POA: Diagnosis not present

## 2022-09-15 DIAGNOSIS — E440 Moderate protein-calorie malnutrition: Secondary | ICD-10-CM | POA: Diagnosis not present

## 2022-09-15 DIAGNOSIS — L98492 Non-pressure chronic ulcer of skin of other sites with fat layer exposed: Secondary | ICD-10-CM | POA: Diagnosis not present

## 2022-09-15 DIAGNOSIS — I739 Peripheral vascular disease, unspecified: Secondary | ICD-10-CM | POA: Diagnosis not present

## 2022-09-22 DIAGNOSIS — E440 Moderate protein-calorie malnutrition: Secondary | ICD-10-CM | POA: Diagnosis not present

## 2022-09-22 DIAGNOSIS — L89612 Pressure ulcer of right heel, stage 2: Secondary | ICD-10-CM | POA: Diagnosis not present

## 2022-09-22 DIAGNOSIS — I5022 Chronic systolic (congestive) heart failure: Secondary | ICD-10-CM | POA: Diagnosis not present

## 2022-09-22 DIAGNOSIS — I739 Peripheral vascular disease, unspecified: Secondary | ICD-10-CM | POA: Diagnosis not present

## 2022-09-25 ENCOUNTER — Emergency Department: Payer: HMO

## 2022-09-25 ENCOUNTER — Encounter: Payer: Self-pay | Admitting: Emergency Medicine

## 2022-09-25 ENCOUNTER — Emergency Department
Admission: EM | Admit: 2022-09-25 | Discharge: 2022-09-25 | Disposition: A | Discharge status: Skilled Nursing Facility | Payer: HMO | Attending: Emergency Medicine | Admitting: Emergency Medicine

## 2022-09-25 DIAGNOSIS — Z7982 Long term (current) use of aspirin: Secondary | ICD-10-CM | POA: Insufficient documentation

## 2022-09-25 DIAGNOSIS — J449 Chronic obstructive pulmonary disease, unspecified: Secondary | ICD-10-CM | POA: Diagnosis not present

## 2022-09-25 DIAGNOSIS — I4891 Unspecified atrial fibrillation: Secondary | ICD-10-CM | POA: Diagnosis not present

## 2022-09-25 DIAGNOSIS — Z7902 Long term (current) use of antithrombotics/antiplatelets: Secondary | ICD-10-CM | POA: Diagnosis not present

## 2022-09-25 DIAGNOSIS — N3001 Acute cystitis with hematuria: Secondary | ICD-10-CM | POA: Diagnosis not present

## 2022-09-25 DIAGNOSIS — I700 Atherosclerosis of aorta: Secondary | ICD-10-CM | POA: Diagnosis not present

## 2022-09-25 DIAGNOSIS — R319 Hematuria, unspecified: Secondary | ICD-10-CM | POA: Diagnosis not present

## 2022-09-25 DIAGNOSIS — Z1152 Encounter for screening for COVID-19: Secondary | ICD-10-CM | POA: Diagnosis not present

## 2022-09-25 DIAGNOSIS — M160 Bilateral primary osteoarthritis of hip: Secondary | ICD-10-CM | POA: Diagnosis not present

## 2022-09-25 DIAGNOSIS — Z9049 Acquired absence of other specified parts of digestive tract: Secondary | ICD-10-CM | POA: Diagnosis not present

## 2022-09-25 DIAGNOSIS — J439 Emphysema, unspecified: Secondary | ICD-10-CM | POA: Diagnosis not present

## 2022-09-25 DIAGNOSIS — N3091 Cystitis, unspecified with hematuria: Secondary | ICD-10-CM | POA: Insufficient documentation

## 2022-09-25 DIAGNOSIS — N3090 Cystitis, unspecified without hematuria: Secondary | ICD-10-CM

## 2022-09-25 LAB — URINALYSIS, W/ REFLEX TO CULTURE (INFECTION SUSPECTED)
Bilirubin Urine: NEGATIVE
Glucose, UA: NEGATIVE mg/dL
Ketones, ur: NEGATIVE mg/dL
Nitrite: NEGATIVE
Protein, ur: 100 mg/dL — AB
RBC / HPF: >50 RBC/hpf (ref 0–5)
Specific Gravity, Urine: 1.011 (ref 1.005–1.030)
Squamous Epithelial / HPF: NONE SEEN /HPF (ref 0–5)
WBC, UA: >50 WBC/hpf (ref 0–5)
pH: 8 (ref 5.0–8.0)

## 2022-09-25 LAB — COMPREHENSIVE METABOLIC PANEL
ALT: 19 U/L (ref 0–44)
AST: 28 U/L (ref 15–41)
Albumin: 3.5 g/dL (ref 3.5–5.0)
Alkaline Phosphatase: 121 U/L (ref 38–126)
Anion gap: 9 (ref 5–15)
BUN: 17 mg/dL (ref 8–23)
CO2: 35 mmol/L — ABNORMAL HIGH (ref 22–32)
Calcium: 9.1 mg/dL (ref 8.9–10.3)
Chloride: 90 mmol/L — ABNORMAL LOW (ref 98–111)
Creatinine, Ser: 0.82 mg/dL (ref 0.61–1.24)
GFR, Estimated: >60 mL/min (ref >60)
Glucose, Bld: 119 mg/dL — ABNORMAL HIGH (ref 70–99)
Potassium: 3.4 mmol/L — ABNORMAL LOW (ref 3.5–5.1)
Sodium: 134 mmol/L — ABNORMAL LOW (ref 135–145)
Total Bilirubin: 1 mg/dL (ref 0.3–1.2)
Total Protein: 7 g/dL (ref 6.5–8.1)

## 2022-09-25 LAB — CBC WITH DIFFERENTIAL/PLATELET
Abs Immature Granulocytes: 0.05 10*3/uL (ref 0.00–0.07)
Basophils Absolute: 0.1 10*3/uL (ref 0.0–0.1)
Basophils Relative: 1 %
Eosinophils Absolute: 0.2 10*3/uL (ref 0.0–0.5)
Eosinophils Relative: 1 %
HCT: 47.6 % (ref 39.0–52.0)
Hemoglobin: 14.9 g/dL (ref 13.0–17.0)
Immature Granulocytes: 0 %
Lymphocytes Relative: 21 %
Lymphs Abs: 2.7 10*3/uL (ref 0.7–4.0)
MCH: 29.2 pg (ref 26.0–34.0)
MCHC: 31.3 g/dL (ref 30.0–36.0)
MCV: 93.2 fL (ref 80.0–100.0)
Monocytes Absolute: 1.3 10*3/uL — ABNORMAL HIGH (ref 0.1–1.0)
Monocytes Relative: 10 %
Neutro Abs: 8.9 10*3/uL — ABNORMAL HIGH (ref 1.7–7.7)
Neutrophils Relative %: 67 %
Platelets: 426 10*3/uL — ABNORMAL HIGH (ref 150–400)
RBC: 5.11 MIL/uL (ref 4.22–5.81)
RDW: 13.7 % (ref 11.5–15.5)
WBC: 13.1 10*3/uL — ABNORMAL HIGH (ref 4.0–10.5)
nRBC: 0 % (ref 0.0–0.2)

## 2022-09-25 LAB — TROPONIN I (HIGH SENSITIVITY)
Troponin I (High Sensitivity): 7 ng/L (ref <18)
Troponin I (High Sensitivity): 6 ng/L (ref <18)

## 2022-09-25 LAB — RESP PANEL BY RT-PCR (RSV, FLU A&B, COVID)  RVPGX2
Influenza A by PCR: NEGATIVE
Influenza B by PCR: NEGATIVE
Resp Syncytial Virus by PCR: NEGATIVE
SARS Coronavirus 2 by RT PCR: NEGATIVE

## 2022-09-25 LAB — BRAIN NATRIURETIC PEPTIDE: B Natriuretic Peptide: 353.3 pg/mL — ABNORMAL HIGH (ref 0.0–100.0)

## 2022-09-25 LAB — PROTIME-INR
INR: 1 (ref 0.8–1.2)
Prothrombin Time: 13.4 seconds (ref 11.4–15.2)

## 2022-09-25 LAB — APTT: aPTT: 30 seconds (ref 24–36)

## 2022-09-25 MED ORDER — SULFAMETHOXAZOLE-TRIMETHOPRIM 800-160 MG PO TABS: 1.0000 | ORAL_TABLET | Freq: Two times a day (BID) | ORAL | 0 refills | Status: AC

## 2022-09-25 MED ORDER — METOPROLOL TARTRATE 50 MG PO TABS
100.0000 mg | ORAL_TABLET | Freq: Once | ORAL | Status: AC
  Administered 2022-09-25: 100 mg via ORAL
  Filled 2022-09-25: qty 2

## 2022-09-25 MED ORDER — SODIUM CHLORIDE 0.9 % IV SOLN
1.0000 g | Freq: Once | INTRAVENOUS | Status: AC
  Administered 2022-09-25: 1 g via INTRAVENOUS
  Filled 2022-09-25: qty 10

## 2022-09-25 NOTE — ED Triage Notes (Signed)
Pt brought in via ems from [facility]. Pt co of blood in urine starting last night and lower back/flank pain . Pt states he takes a blood thinner, did not take it this am. Ems states facility says pt has been passing large clotts in urine. Pt wears 2L [place] at baseline.

## 2022-09-25 NOTE — Discharge Instructions (Addendum)
Please take 5 days of antibiotics in case this could be a UTI causing the blood in the urine.  His CT scan was reassuring and there is no evidence of blood clots causing retention however if he stops urinating he would need to return to the ER for Foley placement and irrigation.  However given the clots at this time are very small and none are apparent on CT imaging they were okay with holding off on Foley at this time.  However you need to follow-up with Dr. Bernardo Heater outpatient please call to make appoitnemtn.. Continue plavix and aspirin for now due to risk of stent thrombosis.

## 2022-09-25 NOTE — ED Notes (Signed)
Pt pulses located with doppler.

## 2022-09-25 NOTE — ED Notes (Signed)
[facility] transport [facility]

## 2022-09-25 NOTE — ED Provider Notes (Signed)
[facility] Provider Note    Event Date/Time   First MD Initiated Contact with Patient 09/25/22 1024     (approximate)   History   Hematuria   HPI  Erik [name] is a 67 y.o. male discharged to rehab facility on 2/1 after admission.  Patient was started on Plavix 75.  His aspirin was decreased from 325-81.  Patient has known history of atrial fibrillation.  He was not placed on anticoagulation due to prior refusal and patient was also seen for a nosebleed.  He was found to have a COPD/pneumonia normally on 2 L, was requiring 6 L.  With EMS he was satting 88% on 2 L and had to be increased to 4 L.  His biggest concern today was some blood in his urine associate with some flank pain.  He reports that this started last night.  EMS reported large clots in urine.  He wears 2 L at baseline but there was a concern that he had to increase it.  They held his blood thinner this morning.   Physical Exam   Triage Vital Signs: ED Triage Vitals  Enc Vitals Group     BP 09/25/22 1020 130/76     Pulse Rate 09/25/22 1019 98     Resp 09/25/22 1019 (!) 23     Temp 09/25/22 1019 98.1 F (36.7 C)     Temp Source 09/25/22 1019 Oral     SpO2 09/25/22 1019 98 %     Weight --      Height --      Head Circumference --      [place] [facility] --      Pain Score 09/25/22 1012 6     Pain Loc --      Pain Edu? --      Excl. in [place]? --     Most recent vital signs: Vitals:   09/25/22 1019 09/25/22 1020  BP:  130/76  Pulse: 98   Resp: (!) 23   Temp: 98.1 F (36.7 C)   SpO2: 98%      General: Awake, no distress.  CV:  Good peripheral perfusion.  Atrial fibrillation Resp:  Normal effort.  Abd:  No distention.  Other:  Soft and nontender abdomen.  Penile exam done with chaperone in the room.  No obvious blood coming from the urethra at this time but a little bit of blood noted on his diaper.  No swelling noted of his testicles. Right leg is wrapped from recent procedure on his  right leg.  On 1/24.  Has baseline ulcerations noted on right leg with DP pulses that are palpated and confirmed with Doppler bilaterally. No nec. Toes like prior.   ED Results / Procedures / Treatments   Labs (all labs ordered are listed, but only abnormal results are displayed) Labs Reviewed  RESP PANEL BY RT-PCR (RSV, FLU A&B, COVID)  [id number]  CBC WITH DIFFERENTIAL/PLATELET  COMPREHENSIVE METABOLIC PANEL  BRAIN NATRIURETIC PEPTIDE  PROTIME-INR  APTT  TROPONIN I (HIGH SENSITIVITY)     EKG  My interpretation of EKG:  Atrial fibrillation rate of 113 without any ST elevation or T wave inversions, normal intervals  RADIOLOGY I have reviewed the CT personally interpreted and no evidence of any significant clots in the bladder  PROCEDURES:  Critical Care performed: No  Procedures   MEDICATIONS ORDERED IN ED: Medications  metoprolol tartrate (LOPRESSOR) tablet 100 mg (100 mg Oral Given 09/25/22 1458)  IMPRESSION / MDM / ASSESSMENT AND PLAN / ED COURSE  I reviewed the triage vital signs and the nursing notes.   Patient's presentation is most consistent with acute presentation with potential threat to life or bodily function.   Patient comes in with concern for hematuria.  Differential is UTI, kidney stone, uterine mass.  Given the flank pain we will get CT imaging to rule out any evidence of kidney stone.  On review of records patient was placed on Plavix.  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  His feet at this time are warm and well-perfused.  Bladder scan to look for any evidence  of retention was only 200 cc.  Will hold off on Foley catheter at this time  Patient's chest x-ray with some chronic findings of emphysema with some mild cardiomegaly..  Patient had recent echo on 08/30/2022 with an EF of 35 to 40%.  Patient is been on his baseline 2.5 L of oxygen here without any significant  respiratory symptoms oxygen levels have looked good.  His heart rates are slightly elevated to 110 but he states that he never got his home oral metoprolol this morning of 100 mg.  Will give him a dose here now.  Discussed with Dr. Bernardo Heater from urology no clots in bladder-postvoid residual was normal therefore no evidence of retention at this time.  Patient does not need Foley placement.  Unable to stop Plavix due to recent stent placement and the risk for causing stent thrombosis recommend continuing Plavix and aspirin at this time and discussing tomorrow when he has an appointment with his vascular doctor.  In the meantime we will start him on antibiotic for possible UTI causing hematuria urine culture was sent.  Considered admission but  patient has no AKI no anemia no retention.  Repeat heart rates are better after metoprolol and patient can be discharged home.  White count slightly elevated but similar to prior patient remains afebrile he meets sepsis criteria but his heart rate I suspect is more likely related to the missed metoprolol and heart rates have come down with his home dose of metoprolol do not feel like patient is septic he is very well-appearing.  However while waiting transport will give a dose of ceftriaxone  The patient is on the cardiac monitor to evaluate for evidence of arrhythmia and/or significant heart rate changes.      FINAL CLINICAL IMPRESSION(S) / ED DIAGNOSES   Final diagnoses:  Cystitis  Hematuria, unspecified type  Atrial fibrillation, unspecified type ([place])     Rx / DC Orders   ED Discharge Orders          Ordered    sulfamethoxazole-trimethoprim (BACTRIM DS) 800-160 MG tablet  2 times daily        09/25/22 1521             Note:  This document was prepared using Dragon voice recognition software and may include unintentional dictation errors.   Vanessa [place], MD 09/25/22 1609    Vanessa [place], MD 09/26/22 [phone]

## 2022-09-26 ENCOUNTER — Telehealth: Payer: Self-pay

## 2022-09-26 ENCOUNTER — Encounter (INDEPENDENT_AMBULATORY_CARE_PROVIDER_SITE_OTHER): Payer: Self-pay | Admitting: Vascular Surgery

## 2022-09-26 ENCOUNTER — Ambulatory Visit (INDEPENDENT_AMBULATORY_CARE_PROVIDER_SITE_OTHER): Payer: HMO | Admitting: Vascular Surgery

## 2022-09-26 VITALS — BP 124/80 | HR 79 | Resp 16

## 2022-09-26 DIAGNOSIS — I6523 Occlusion and stenosis of bilateral carotid arteries: Secondary | ICD-10-CM | POA: Diagnosis not present

## 2022-09-26 DIAGNOSIS — I10 Essential (primary) hypertension: Secondary | ICD-10-CM | POA: Diagnosis not present

## 2022-09-26 DIAGNOSIS — I482 Chronic atrial fibrillation, unspecified: Secondary | ICD-10-CM

## 2022-09-26 DIAGNOSIS — J9611 Chronic respiratory failure with hypoxia: Secondary | ICD-10-CM

## 2022-09-26 DIAGNOSIS — I70213 Atherosclerosis of native arteries of extremities with intermittent claudication, bilateral legs: Secondary | ICD-10-CM | POA: Diagnosis not present

## 2022-09-26 DIAGNOSIS — E782 Mixed hyperlipidemia: Secondary | ICD-10-CM

## 2022-09-26 NOTE — Progress Notes (Unsigned)
MRN : [id number]  Erik [name] is a 67 y.o. (1956/07/09) male who presents with chief complaint of check circulation.  History of Present Illness:   The patient returns to the office for followup and review status post angiogram with intervention on 08/31/2022.   Procedure:   Percutaneous transluminal angioplasty and stent placement right superficial femoral artery and popliteal 2.     Percutaneous transluminal angioplasty and stent placement right common iliac artery.   The patient notes improvement in the lower extremity symptoms. No interval shortening of the patient's claudication distance or rest pain symptoms. No new ulcers or wounds have occurred since the last visit.  There have been no significant changes to the patient's overall health care.  No documented history of amaurosis fugax or recent TIA symptoms. There are no recent neurological changes noted. No documented history of DVT, PE or superficial thrombophlebitis. The patient denies recent episodes of angina or shortness of breath.   ABI's Rt=*** and Lt=***  (previous ABI's Rt=0.96 and Lt=1.19) Duplex US of the *** lower extremity arterial system shows ***  No outpatient medications have been marked as taking for the 09/26/22 encounter (Appointment) with Delana Meyer, Dolores Lory, MD.    Past Medical History:  Diagnosis Date   Arthritis    Asthma    Atrial fibrillation ([place])    CHF (congestive heart failure) ([place])    COPD (chronic obstructive pulmonary disease) ([place])    Coronary artery disease    GERD (gastroesophageal reflux disease)    Hypertension    Myocardial infarction ([place])    Shortness of breath dyspnea     Past Surgical History:  Procedure Laterality Date   ANGIOPLASTY  2009   CARDIAC CATHETERIZATION N/A 03/18/2015   Procedure: Left Heart Cath with Coronary Angiography;  Surgeon: Corey Skains, MD;  Location: [facility];  Service: Cardiovascular;  Laterality: N/A;    CHOLECYSTECTOMY  2005   CORONARY ANGIOGRAM  2009   LOWER EXTREMITY ANGIOGRAPHY Right 08/31/2022   Procedure: Lower Extremity Angiography;  Surgeon: Katha Cabal, MD;  Location: [facility];  Service: Cardiovascular;  Laterality: Right;    Social History Social History   Tobacco Use   Smoking status: Former    Years: 25.00    Types: Cigarettes    Quit date: 03/20/1997    Years since quitting: 25.5   Smokeless tobacco: Never  Vaping Use   Vaping Use: Never used  Substance Use Topics   Alcohol use: No   Drug use: No    Family History Family History  Problem Relation Age of Onset   Coronary artery disease Father    Diabetes Father    Hyperlipidemia Father    Hypertension Father    Stroke Mother    Hyperlipidemia Mother     Allergies  Allergen Reactions   Benadryl [Diphenhydramine Hcl] Shortness Of Breath   Morphine And Related Swelling    Difficulty breathing per patient    Oxycodone Swelling    Severe mouth swelling requiring medical intervention     REVIEW OF SYSTEMS (Negative unless checked)  Constitutional: []$ Weight loss  []$ Fever  []$ Chills Cardiac: []$ Chest pain   []$ Chest pressure   []$ Palpitations   []$ Shortness of breath when laying flat   []$ Shortness of breath with exertion. Vascular:  [x]$ Pain in legs with walking   []$ Pain in legs at rest  []$ History of DVT   []$ Phlebitis   []$   Swelling in legs   []$ Varicose veins   []$ Non-healing ulcers Pulmonary:   []$ Uses home oxygen   []$ Productive cough   []$ Hemoptysis   []$ Wheeze  []$ COPD   []$ Asthma Neurologic:  []$ Dizziness   []$ Seizures   []$ History of stroke   []$ History of TIA  []$ Aphasia   []$ Vissual changes   []$ Weakness or numbness in arm   []$ Weakness or numbness in leg Musculoskeletal:   []$ Joint swelling   []$ Joint pain   []$ Low back pain Hematologic:  []$ Easy bruising  []$ Easy bleeding   []$ Hypercoagulable state   []$ Anemic Gastrointestinal:  []$ Diarrhea   []$ Vomiting  []$ Gastroesophageal reflux/heartburn   []$ Difficulty  swallowing. Genitourinary:  []$ Chronic kidney disease   []$ Difficult urination  []$ Frequent urination   []$ Blood in urine Skin:  []$ Rashes   []$ Ulcers  Psychological:  []$ History of anxiety   []$  History of major depression.  Physical Examination  There were no vitals filed for this visit. There is no height or weight on file to calculate BMI. Gen: WD/WN, NAD Head: Jane/AT, No temporalis wasting.  Ear/Nose/Throat: Hearing grossly intact, nares w/o erythema or drainage Eyes: PER, EOMI, sclera nonicteric.  Neck: Supple, no masses.  No bruit or JVD.  Pulmonary:  Good air movement, no audible wheezing, no use of accessory muscles.  Cardiac: RRR, normal S1, S2, no Murmurs. Vascular:  mild trophic changes, no open wounds Vessel Right Left  Radial Palpable Palpable  PT Not Palpable Not Palpable  DP Not Palpable Not Palpable  Gastrointestinal: soft, non-distended. No guarding/no peritoneal signs.  Musculoskeletal: M/S 5/5 throughout.  No visible deformity.  Neurologic: CN 2-12 intact. Pain and light touch intact in extremities.  Symmetrical.  Speech is fluent. Motor exam as listed above. Psychiatric: Judgment intact, Mood & affect appropriate for pt's clinical situation. Dermatologic: No rashes or ulcers noted.  No changes consistent with cellulitis.   CBC Lab Results  Component Value Date   WBC 13.1 (H) 09/25/2022   HGB 14.9 09/25/2022   HCT 47.6 09/25/2022   MCV 93.2 09/25/2022   PLT 426 (H) 09/25/2022    BMET    Component Value Date/Time   NA 134 (L) 09/25/2022 1021   NA 138 02/19/2020 0000   NA 135 (L) 07/19/2013 0417   K 3.4 (L) 09/25/2022 1021   K 3.9 07/19/2013 0417   CL 90 (L) 09/25/2022 1021   CL 101 07/19/2013 0417   CO2 35 (H) 09/25/2022 1021   CO2 29 07/19/2013 0417   GLUCOSE 119 (H) 09/25/2022 1021   GLUCOSE 86 07/19/2013 0417   BUN 17 09/25/2022 1021   BUN 14 02/19/2020 0000   BUN 10 07/19/2013 0417   CREATININE 0.82 09/25/2022 1021   CREATININE 0.80 07/19/2013  0417   CALCIUM 9.1 09/25/2022 1021   CALCIUM 8.4 (L) 07/19/2013 0417   GFRNONAA >60 09/25/2022 1021   GFRNONAA >60 07/19/2013 0417   GFRAA 105 02/19/2020 0000   GFRAA >60 07/19/2013 0417   CrCl cannot be calculated (Unknown ideal weight.).  COAG Lab Results  Component Value Date   INR 1.0 09/25/2022   INR 1.2 08/27/2022    Radiology CT Renal Stone Study  Result Date: 09/25/2022 CLINICAL DATA:  Hematuria EXAM: CT ABDOMEN AND PELVIS WITHOUT CONTRAST TECHNIQUE: Multidetector CT imaging of the abdomen and pelvis was performed following the standard protocol without IV contrast. RADIATION DOSE REDUCTION: This exam was performed according to the departmental dose-optimization program which includes automated exposure control, adjustment of the mA and/or kV according to patient size and/or use of iterative  reconstruction technique. COMPARISON:  08/27/2022 FINDINGS: Lower chest: Emphysematous bullous changes. Dependent subsegmental atelectasis. No pericardial or pleural effusion. Cardiomegaly. Atheromatous calcifications of the aorta and coronary arteries. Hepatobiliary: No focal liver abnormality is seen. Status post cholecystectomy. No biliary dilatation. Pancreas: Unremarkable. No pancreatic ductal dilatation or surrounding inflammatory changes. Spleen: Normal in size without focal abnormality. Adrenals/Urinary Tract: Adrenal glands are unremarkable. Kidneys are normal, without renal calculi, focal lesion, or hydronephrosis. Bladder is unremarkable. Stomach/Bowel: Stomach is within normal limits. Appendix appears normal. No evidence of bowel wall thickening, distention, or inflammatory changes. Vascular/Lymphatic: Aortic atherosclerosis. No enlarged abdominal or pelvic lymph nodes. Reproductive: Prostate is unremarkable. Other: No abdominal wall hernia or abnormality. No abdominopelvic ascites. Musculoskeletal: Osseous structures are osteopenic. Compression deformities L2, L4 and L5 are stable  findings. Bilateral hip osteoarthritic changes identified. IMPRESSION: 1. Cardiomegaly. 2. COPD. 3. Stable lumbar compression deformities and bilateral hip degenerative changes. 4. No acute abdominal or pelvic pathology. Electronically Signed   By: Sammie Bench M.D.   On: 09/25/2022 13:16   DG Chest 2 View  Result Date: 09/25/2022 CLINICAL DATA:  Hematuria beginning last night. EXAM: CHEST - 2 VIEW COMPARISON:  08/27/2022. FINDINGS: Cardiac silhouette mildly enlarged. No mediastinal or hilar masses. No evidence of adenopathy. Bilateral interstitial thickening most evident in the lung bases. Lucency in the mid to upper lungs consistent with emphysema. No convincing pneumonia or pulmonary edema. No pleural effusion or pneumothorax. Skeletal structures are intact. IMPRESSION: 1. No acute cardiopulmonary disease. 2. Mild cardiomegaly, chronic interstitial thickening and emphysema. Electronically Signed   By: Lajean Manes M.D.   On: 09/25/2022 11:18   PERIPHERAL VASCULAR CATHETERIZATION  Result Date: 08/31/2022 See surgical note for result.  ECHOCARDIOGRAM COMPLETE  Result Date: 08/30/2022    ECHOCARDIOGRAM REPORT   Patient Name:   Erik [name] Date of Exam: 08/30/2022 Medical Rec #:  [id number]      Height:       70.0 in Accession #:    [id number]     Weight:       238.1 lb Date of Birth:  Sep 20, 1955     BSA:          2.248 m Patient Age:    67 years       BP:           116/76 mmHg Patient Gender: M              HR:           110 bpm. Exam Location:  [facility] Procedure: 2D Echo Indications:     pulmonary embolus  History:         Patient has prior history of Echocardiogram examinations, most                  recent 01/14/2022. CHF, CAD, COPD, Arrythmias:Atrial                  Fibrillation; Risk Factors:Dyslipidemia and Hypertension.  Sonographer:     Harvie Junior Referring Phys:  [id number] [place] Diagnosing Phys: Kathlyn Sacramento MD  Sonographer Comments: Technically difficult study due to poor echo windows,  patient is obese and no subcostal window. Image acquisition challenging due to patient body habitus and supine. IMPRESSIONS  1. Left ventricular ejection fraction, by estimation, is 35 to 40%. The left ventricle has moderately decreased function. Left ventricular endocardial border not optimally defined to evaluate regional wall motion. Left ventricular diastolic parameters are indeterminate.  2. Right ventricular systolic function is  normal. The right ventricular size is mildly enlarged. There is moderately elevated pulmonary artery systolic pressure.  3. Left atrial size was mildly dilated.  4. The mitral valve is abnormal. Mild to moderate mitral valve regurgitation. No evidence of mitral stenosis. Moderate mitral annular calcification.  5. The aortic valve is normal in structure. Aortic valve regurgitation is trivial. Aortic valve sclerosis/calcification is present, without any evidence of aortic stenosis.  6. The inferior vena cava is dilated in size with <50% respiratory variability, suggesting right atrial pressure of 15 mmHg. FINDINGS  Left Ventricle: Left ventricular ejection fraction, by estimation, is 35 to 40%. The left ventricle has moderately decreased function. Left ventricular endocardial border not optimally defined to evaluate regional wall motion. The left ventricular internal cavity size was normal in size. There is no left ventricular hypertrophy. Left ventricular diastolic parameters are indeterminate. Right Ventricle: The right ventricular size is mildly enlarged. No increase in right ventricular wall thickness. Right ventricular systolic function is normal. There is moderately elevated pulmonary artery systolic pressure. The tricuspid regurgitant velocity is 2.91 m/s, and with an assumed right atrial pressure of 15 mmHg, the estimated right ventricular systolic pressure is [id number] mmHg. Left Atrium: Left atrial size was mildly dilated. Right Atrium: Right atrial size was normal in size.  Pericardium: There is no evidence of pericardial effusion. Mitral Valve: The mitral valve is abnormal. There is mild thickening of the mitral valve leaflet(s). There is moderate calcification of the mitral valve leaflet(s). Moderate mitral annular calcification. Mild to moderate mitral valve regurgitation. No evidence of mitral valve stenosis. Tricuspid Valve: The tricuspid valve is normal in structure. Tricuspid valve regurgitation is mild . No evidence of tricuspid stenosis. Aortic Valve: The aortic valve is normal in structure. Aortic valve regurgitation is trivial. Aortic regurgitation PHT measures 498 msec. Aortic valve sclerosis/calcification is present, without any evidence of aortic stenosis. Aortic valve mean gradient  measures 4.6 mmHg. Aortic valve peak gradient measures 8.1 mmHg. Aortic valve area, by VTI measures 1.23 cm. Pulmonic Valve: The pulmonic valve was normal in structure. Pulmonic valve regurgitation is not visualized. No evidence of pulmonic stenosis. Aorta: The aortic root is normal in size and structure. Venous: The inferior vena cava is dilated in size with less than 50% respiratory variability, suggesting right atrial pressure of 15 mmHg. IAS/Shunts: No atrial level shunt detected by color flow Doppler.  LEFT VENTRICLE PLAX 2D LVIDd:         4.40 cm      Diastology LVIDs:         3.50 cm      LV e' medial:    13.27 cm/s LV PW:         1.00 cm      LV E/e' medial:  10.2 LV IVS:        1.00 cm      LV e' lateral:   11.60 cm/s LVOT diam:     1.80 cm      LV E/e' lateral: 11.6 LV SV:         28 LV SV Index:   12 LVOT Area:     2.54 cm                              3D Volume EF: LV Volumes (MOD)            3D EF:        48 % LV vol d, MOD  A2C: 96.9 ml  LV EDV:       217 ml LV vol d, MOD A4C: 111.6 ml LV ESV:       113 ml LV vol s, MOD A2C: 54.1 ml  LV SV:        104 ml LV vol s, MOD A4C: 72.1 ml LV SV MOD A2C:     42.8 ml LV SV MOD A4C:     111.6 ml LV SV MOD BP:      46.4 ml RIGHT VENTRICLE  RV Basal diam:  5.40 cm RV Mid diam:    4.30 cm RV S prime:     8.22 cm/s TAPSE (M-mode): 1.4 cm LEFT ATRIUM             Index        RIGHT ATRIUM           Index LA diam:        4.60 cm 2.05 cm/m   RA Area:     28.40 cm LA Vol (A2C):   79.7 ml 35.46 ml/m  RA Volume:   101.00 ml 44.93 ml/m LA Vol (A4C):   65.2 ml 29.01 ml/m LA Biplane Vol: 74.8 ml 33.28 ml/m  AORTIC VALVE                    PULMONIC VALVE AV Area (Vmax):    1.18 cm     PV Vmax:       1.08 m/s AV Area (Vmean):   1.12 cm     PV Peak grad:  4.7 mmHg AV Area (VTI):     1.23 cm AV Vmax:           142.60 cm/s AV Vmean:          99.000 cm/s AV VTI:            0.225 m AV Peak Grad:      8.1 mmHg AV Mean Grad:      4.6 mmHg LVOT Vmax:         66.10 cm/s LVOT Vmean:        43.525 cm/s LVOT VTI:          0.108 m LVOT/AV VTI ratio: 0.48 AI PHT:            498 msec  AORTA Ao Root diam: 3.30 cm MITRAL VALVE                TRICUSPID VALVE MV Area (PHT): 4.09 cm     TR Peak grad:   33.9 mmHg MV Decel Time: 186 msec     TR Vmax:        291.00 cm/s MR Peak grad: 65.9 mmHg MR Vmax:      405.80 cm/s   SHUNTS MV E velocity: 134.67 cm/s  Systemic VTI:  0.11 m                             Systemic Diam: 1.80 cm Kathlyn Sacramento MD Electronically signed by Kathlyn Sacramento MD Signature Date/Time: 08/30/2022/4:52:58 PM    Final    US ARTERIAL ABI (SCREENING LOWER EXTREMITY)  Result Date: 08/28/2022 CLINICAL DATA:  Left leg wound EXAM: NONINVASIVE PHYSIOLOGIC VASCULAR STUDY OF BILATERAL LOWER EXTREMITIES TECHNIQUE: Evaluation of both lower extremities were performed at rest, including calculation of ankle-brachial indices with single level pressure measurements and doppler recording. COMPARISON:  None available. FINDINGS: Right ABI:  1.15 Left ABI:  1.13 Right Lower Extremity: Monophasic dorsalis pedis and posterior tibial artery waveforms. Left Lower Extremity: Monophasic posterior tibial and dorsalis pedis artery waveforms. IMPRESSION: Although ankle-brachial  indices are within normal limits, there are monophasic bilateral lower extremity arterial waveforms which suggest that there is underlying arterial occlusive disease. If clinical suspicion remains high for arterial occlusive disease, further evaluation with CT angiography runoff of the lower extremity should be performed. Electronically Signed   By: Miachel Roux M.D.   On: 08/28/2022 19:58   MR BRAIN WO CONTRAST  Result Date: 08/27/2022 CLINICAL DATA:  Neuro deficit, acute, stroke suspected EXAM: MRI HEAD WITHOUT CONTRAST TECHNIQUE: Multiplanar, multiecho pulse sequences of the brain and surrounding structures were obtained without intravenous contrast. COMPARISON:  CT head from the same day. FINDINGS: Brain: No acute infarction, hemorrhage, hydrocephalus, extra-axial collection or mass lesion. Remote right frontal infarct with associated encephalomalacia and gliosis. Additional scattered T2/FLAIR hyperintensities in the white matter are compatible with chronic microvascular ischemic disease. Cerebral atrophy with ex vacuo ventricular dilation. Callosal angle is within normal limits. Vascular: Major arterial flow voids are maintained at the skull base. Skull and upper cervical spine: Normal marrow signal. Sinuses/Orbits: Clear sinuses.  No acute orbital findings. Other: No mastoid effusions. IMPRESSION: 1. No evidence of acute intracranial abnormality. 2. 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 3.  Cerebral Atrophy (ICD10-G31.9). Electronically Signed   By: Margaretha Sheffield M.D.   On: 08/27/2022 19:27   CT Head Wo Contrast  Result Date: 08/27/2022 CLINICAL DATA:  Trauma. EXAM: CT HEAD WITHOUT CONTRAST CT CERVICAL SPINE WITHOUT CONTRAST TECHNIQUE: Multidetector CT imaging of the head and cervical spine was performed following the standard protocol without intravenous contrast. Multiplanar CT image reconstructions of the cervical spine were also generated. RADIATION DOSE  REDUCTION: This exam was performed according to the departmental dose-optimization program which includes automated exposure control, adjustment of the mA and/or kV according to patient size and/or use of iterative reconstruction technique. COMPARISON:  None Available. FINDINGS: CT HEAD FINDINGS Brain: Ventricles and sulci are appropriate for patient's age. Periventricular and subcortical white matter hypodensities compatible with chronic microvascular ischemic changes. Encephalomalacia within the anterior right frontal lobe suggestive of chronic infarct. Additionally within the right posterior parietal lobe there is focal encephalomalacia suggestive of a chronic infarct. No evidence for acute cortically based infarct, intracranial hemorrhage, mass lesion or mass-effect. Vascular: No hyperdense vessel or unexpected calcification. Skull: Normal. Negative for fracture or focal lesion. Sinuses/Orbits: Paranasal sinuses are well aerated. Mastoid air cells are unremarkable. Orbits are unremarkable. Other: None CT CERVICAL SPINE FINDINGS Alignment: Straightening of the normal cervical lordosis. Skull base and vertebrae: Intact. Soft tissues and spinal canal: No prevertebral fluid or swelling. No visible canal hematoma. Disc levels: No acute fracture. Degenerative disc disease most pronounced C5-6. Left C2-3 facet fusion. Left C3-4 facet degenerative changes. Upper chest: Apical emphysematous change. Other: None IMPRESSION: 1. No acute intracranial findings. 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 2. No acute cervical spine fracture. Multilevel degenerative changes. Electronically Signed   By: Lovey Newcomer M.D.   On: 08/27/2022 15:52   CT Cervical Spine Wo Contrast  Result Date: 08/27/2022 CLINICAL DATA:  Trauma. EXAM: CT HEAD WITHOUT CONTRAST CT CERVICAL SPINE WITHOUT CONTRAST TECHNIQUE: Multidetector CT imaging of the head and cervical spine was performed following the standard protocol without  intravenous contrast. Multiplanar CT image reconstructions of the cervical spine were also generated. RADIATION DOSE REDUCTION: This exam was performed according to the departmental dose-optimization program  which includes automated exposure control, adjustment of the mA and/or kV according to patient size and/or use of iterative reconstruction technique. COMPARISON:  None Available. FINDINGS: CT HEAD FINDINGS Brain: Ventricles and sulci are appropriate for patient's age. Periventricular and subcortical white matter hypodensities compatible with chronic microvascular ischemic changes. Encephalomalacia within the anterior right frontal lobe suggestive of chronic infarct. Additionally within the right posterior parietal lobe there is focal encephalomalacia suggestive of a chronic infarct. No evidence for acute cortically based infarct, intracranial hemorrhage, mass lesion or mass-effect. Vascular: No hyperdense vessel or unexpected calcification. Skull: Normal. Negative for fracture or focal lesion. Sinuses/Orbits: Paranasal sinuses are well aerated. Mastoid air cells are unremarkable. Orbits are unremarkable. Other: None CT CERVICAL SPINE FINDINGS Alignment: Straightening of the normal cervical lordosis. Skull base and vertebrae: Intact. Soft tissues and spinal canal: No prevertebral fluid or swelling. No visible canal hematoma. Disc levels: No acute fracture. Degenerative disc disease most pronounced C5-6. Left C2-3 facet fusion. Left C3-4 facet degenerative changes. Upper chest: Apical emphysematous change. Other: None IMPRESSION: 1. No acute intracranial findings. 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 2. No acute cervical spine fracture. Multilevel degenerative changes. Electronically Signed   By: Lovey Newcomer M.D.   On: 08/27/2022 15:52   CT CHEST ABDOMEN PELVIS W CONTRAST  Result Date: 08/27/2022 CLINICAL DATA:  Sepsis. EXAM: CT CHEST, ABDOMEN, AND PELVIS WITH CONTRAST TECHNIQUE:  Multidetector CT imaging of the chest, abdomen and pelvis was performed following the standard protocol during bolus administration of intravenous contrast. RADIATION DOSE REDUCTION: This exam was performed according to the departmental dose-optimization program which includes automated exposure control, adjustment of the mA and/or kV according to patient size and/or use of iterative reconstruction technique. CONTRAST:  162m OMNIPAQUE IOHEXOL 300 MG/ML  SOLN COMPARISON:  Abdomen and pelvis CT 09/04/2007. Abdomen CT 03/25/2016. Chest CT 09/09/2019. FINDINGS: CT CHEST FINDINGS Cardiovascular: Heart is enlarged. No pericardial effusion. Coronary artery calcification is evident. Mild atherosclerotic calcification is noted in the wall of the thoracic aorta. Enlargement of the pulmonary outflow tract/main pulmonary arteries suggests pulmonary arterial hypertension. Mediastinum/Nodes: Borderline lymphadenopathy in the subcarinal station, only minimally progressive since 09/09/2019. There is no hilar lymphadenopathy. The esophagus has normal imaging features. There is no axillary lymphadenopathy. Lungs/Pleura: Centrilobular and paraseptal emphysema evident. Bullous changes noted in the lung apices. 9 x 5 mm posterior right upper lobe pulmonary nodule on 74/5 is stable comparing back to 09/09/2019 consistent with benign etiology. No followup imaging is recommended. Patchy airspace disease in the posterior left lower lobe is associated with mild volume loss. Imaging features are suspicious for pneumonia with similar dependent atelectasis in the deep costophrenic sulci bilaterally. There is a tiny left pleural effusion with trace fluid in the cranial aspect of the major fissure. Musculoskeletal: No worrisome lytic or sclerotic osseous abnormality. CT ABDOMEN PELVIS FINDINGS Hepatobiliary: No suspicious focal abnormality within the liver parenchyma. Cholecystectomy. No intrahepatic or extrahepatic biliary dilation. Pancreas:  No focal mass lesion. No dilatation of the main duct. No intraparenchymal cyst. No peripancreatic edema. Spleen: No splenomegaly. No focal mass lesion. Adrenals/Urinary Tract: No adrenal nodule or mass. Kidneys unremarkable. No evidence for hydroureter. The urinary bladder appears normal for the degree of distention. Stomach/Bowel: Stomach is unremarkable. No gastric wall thickening. No evidence of outlet obstruction. Duodenum is normally positioned as is the ligament of Treitz. No small bowel wall thickening. No small bowel dilatation. The terminal ileum is normal. The appendix is normal. No gross colonic mass. No colonic wall thickening. Vascular/Lymphatic:  There is moderate atherosclerotic calcification of the abdominal aorta without aneurysm. There is no gastrohepatic or hepatoduodenal ligament lymphadenopathy. No retroperitoneal or mesenteric lymphadenopathy. 8 mm left common iliac node is stable in the interval, likely reactive. 9 mm short axis right external iliac node on 96/3 is stable comparing back to the 2009 exam consistent with benign reactive etiology. Stable right external iliac node at upper normal for size. Reproductive: The prostate gland and seminal vesicles are unremarkable. Other: No intraperitoneal free fluid. Musculoskeletal: Small bilateral groin hernias contain only fat. Advanced degenerative changes are noted in both hips. No worrisome lytic or sclerotic osseous abnormality. Superior endplate compression deformity noted at L2, L4, and L5. IMPRESSION: 1. Patchy airspace disease in the posterior left lower lobe is associated with mild volume loss. Imaging features are suspicious for pneumonia. There is associated symmetric dependent atelectasis in the deep costophrenic sulci bilaterally. 2. Tiny left pleural effusion with trace fluid in the cranial aspect of the major fissure. 3. Enlargement of the pulmonary outflow tract/main pulmonary arteries suggests pulmonary arterial hypertension. 4. No  acute findings in the abdomen or pelvis. 5. Small bilateral groin hernias contain only fat. 6. Superior endplate compression deformity at L2, L4, and L5. 7. Advanced changes of emphysema with bullous disease in the upper lungs. 8. Aortic Atherosclerosis (ICD10-I70.0) and Emphysema (ICD10-J43.9). Electronically Signed   By: Misty Stanley M.D.   On: 08/27/2022 15:51   DG Tibia/Fibula Right  Result Date: 08/27/2022 CLINICAL DATA:  Right lower leg infection EXAM: RIGHT TIBIA AND FIBULA - 2 VIEW COMPARISON:  None Available. FINDINGS: Soft tissue defect noted laterally in the right calf. No acute bony abnormality. Specifically, no fracture, subluxation, or dislocation. No bone destruction to suggest osteomyelitis. No soft tissue gas. IMPRESSION: No acute bony abnormality. Electronically Signed   By: Rolm Baptise M.D.   On: 08/27/2022 14:25   DG Chest Portable 1 View  Result Date: 08/27/2022 CLINICAL DATA:  Shortness of breath EXAM: PORTABLE CHEST 1 VIEW COMPARISON:  07/13/2022 FINDINGS: Cardiomegaly. No confluent airspace opacities, effusions or edema. No acute bony abnormality. Aortic atherosclerosis. IMPRESSION: Cardiomegaly.  No active disease. Electronically Signed   By: Rolm Baptise M.D.   On: 08/27/2022 14:25     Assessment/Plan There are no diagnoses linked to this encounter.   Hortencia Pilar, MD  09/26/2022 8:49 AM

## 2022-09-26 NOTE — Telephone Encounter (Signed)
Pt wife called that he discharge from hospital he was having UTI and he is at rehab center she like to know we can give him antibiotic advised that hospital already send antibiotic he need follow up appt in person to recheck UA pt wife will call back and make appt

## 2022-09-27 LAB — URINE CULTURE: Culture: 70000 — AB

## 2022-09-28 ENCOUNTER — Encounter (INDEPENDENT_AMBULATORY_CARE_PROVIDER_SITE_OTHER): Payer: Self-pay | Admitting: Vascular Surgery

## 2022-09-30 ENCOUNTER — Telehealth (INDEPENDENT_AMBULATORY_CARE_PROVIDER_SITE_OTHER): Payer: Self-pay

## 2022-09-30 NOTE — Telephone Encounter (Signed)
Patient spouse left a message stating that her husband was just recently release from rehab w/o home health orders sent for PT or skilled nursing. Patient spouse informed that she contacted the rehab facility but is waiting on a response. I recommend for the patient spouse to contact the rehab to have orders sent to home health.

## 2022-10-03 ENCOUNTER — Telehealth: Payer: Self-pay | Admitting: Cardiovascular Disease

## 2022-10-03 NOTE — Telephone Encounter (Signed)
The patient was prescribed Plavix 75 mg once daily after his vascular procedure. He was calling for a refill stating that the provider's office that did the procedure and started the Plavix advised him to call this office for a refill. The patient has 2 pills left.  The patient was due for a follow up in September 2023 with Dr. Fletcher Anon.   He has a follow up with vascular 3/25.

## 2022-10-03 NOTE — Telephone Encounter (Signed)
Pt c/o medication issue:  1. Name of Medication: clopidogrel (PLAVIX) 75 MG tablet   2. How are you currently taking this medication (dosage and times per day)? As prescribed in hospital   3. Are you having a reaction (difficulty breathing--STAT)? No  4. What is your medication issue? Patient will be out of this medication soon and was prescribed this in hosp. Requesting refill sent in.

## 2022-10-03 NOTE — Telephone Encounter (Signed)
That does not make any sense.  He should get Plavix from Dr. Delana Meyer the plan is to keep him on dual antiplatelet therapy.  Does have atrial fibrillation and was supposed to be on anticoagulation but I do not see that.  A closer follow-up appointment with [place] should be scheduled to clarify these issues. I do see that he was given 1 refill of clopidogrel at the hospital.

## 2022-10-04 DIAGNOSIS — J4489 Other specified chronic obstructive pulmonary disease: Secondary | ICD-10-CM | POA: Diagnosis not present

## 2022-10-04 DIAGNOSIS — Z7902 Long term (current) use of antithrombotics/antiplatelets: Secondary | ICD-10-CM | POA: Diagnosis not present

## 2022-10-04 DIAGNOSIS — K219 Gastro-esophageal reflux disease without esophagitis: Secondary | ICD-10-CM | POA: Diagnosis not present

## 2022-10-04 DIAGNOSIS — N390 Urinary tract infection, site not specified: Secondary | ICD-10-CM | POA: Diagnosis not present

## 2022-10-04 DIAGNOSIS — I110 Hypertensive heart disease with heart failure: Secondary | ICD-10-CM | POA: Diagnosis not present

## 2022-10-04 DIAGNOSIS — I739 Peripheral vascular disease, unspecified: Secondary | ICD-10-CM | POA: Diagnosis not present

## 2022-10-04 DIAGNOSIS — I5022 Chronic systolic (congestive) heart failure: Secondary | ICD-10-CM | POA: Diagnosis not present

## 2022-10-04 DIAGNOSIS — L97212 Non-pressure chronic ulcer of right calf with fat layer exposed: Secondary | ICD-10-CM | POA: Diagnosis not present

## 2022-10-04 DIAGNOSIS — M6282 Rhabdomyolysis: Secondary | ICD-10-CM | POA: Diagnosis not present

## 2022-10-04 DIAGNOSIS — J9621 Acute and chronic respiratory failure with hypoxia: Secondary | ICD-10-CM | POA: Diagnosis not present

## 2022-10-04 DIAGNOSIS — J45909 Unspecified asthma, uncomplicated: Secondary | ICD-10-CM | POA: Diagnosis not present

## 2022-10-04 DIAGNOSIS — Z9181 History of falling: Secondary | ICD-10-CM | POA: Diagnosis not present

## 2022-10-04 DIAGNOSIS — I2510 Atherosclerotic heart disease of native coronary artery without angina pectoris: Secondary | ICD-10-CM | POA: Diagnosis not present

## 2022-10-04 DIAGNOSIS — L03116 Cellulitis of left lower limb: Secondary | ICD-10-CM | POA: Diagnosis not present

## 2022-10-04 DIAGNOSIS — L03115 Cellulitis of right lower limb: Secondary | ICD-10-CM | POA: Diagnosis not present

## 2022-10-04 DIAGNOSIS — Z955 Presence of coronary angioplasty implant and graft: Secondary | ICD-10-CM | POA: Diagnosis not present

## 2022-10-04 DIAGNOSIS — Z7982 Long term (current) use of aspirin: Secondary | ICD-10-CM | POA: Diagnosis not present

## 2022-10-04 DIAGNOSIS — Z792 Long term (current) use of antibiotics: Secondary | ICD-10-CM | POA: Diagnosis not present

## 2022-10-04 DIAGNOSIS — I872 Venous insufficiency (chronic) (peripheral): Secondary | ICD-10-CM | POA: Diagnosis not present

## 2022-10-04 DIAGNOSIS — I252 Old myocardial infarction: Secondary | ICD-10-CM | POA: Diagnosis not present

## 2022-10-04 DIAGNOSIS — M1990 Unspecified osteoarthritis, unspecified site: Secondary | ICD-10-CM | POA: Diagnosis not present

## 2022-10-04 DIAGNOSIS — Z8701 Personal history of pneumonia (recurrent): Secondary | ICD-10-CM | POA: Diagnosis not present

## 2022-10-04 NOTE — Telephone Encounter (Signed)
Patient has been made aware and will reach back out to Dr. Nino Parsley office

## 2022-10-10 ENCOUNTER — Telehealth: Payer: Self-pay | Admitting: Nurse Practitioner

## 2022-10-10 ENCOUNTER — Telehealth (INDEPENDENT_AMBULATORY_CARE_PROVIDER_SITE_OTHER): Payer: Self-pay

## 2022-10-10 DIAGNOSIS — I2510 Atherosclerotic heart disease of native coronary artery without angina pectoris: Secondary | ICD-10-CM | POA: Diagnosis not present

## 2022-10-10 DIAGNOSIS — I110 Hypertensive heart disease with heart failure: Secondary | ICD-10-CM | POA: Diagnosis not present

## 2022-10-10 DIAGNOSIS — Z9181 History of falling: Secondary | ICD-10-CM | POA: Diagnosis not present

## 2022-10-10 DIAGNOSIS — L03115 Cellulitis of right lower limb: Secondary | ICD-10-CM | POA: Diagnosis not present

## 2022-10-10 DIAGNOSIS — Z8701 Personal history of pneumonia (recurrent): Secondary | ICD-10-CM | POA: Diagnosis not present

## 2022-10-10 DIAGNOSIS — Z792 Long term (current) use of antibiotics: Secondary | ICD-10-CM | POA: Diagnosis not present

## 2022-10-10 DIAGNOSIS — N390 Urinary tract infection, site not specified: Secondary | ICD-10-CM | POA: Diagnosis not present

## 2022-10-10 DIAGNOSIS — I872 Venous insufficiency (chronic) (peripheral): Secondary | ICD-10-CM | POA: Diagnosis not present

## 2022-10-10 DIAGNOSIS — I739 Peripheral vascular disease, unspecified: Secondary | ICD-10-CM | POA: Diagnosis not present

## 2022-10-10 DIAGNOSIS — J4489 Other specified chronic obstructive pulmonary disease: Secondary | ICD-10-CM | POA: Diagnosis not present

## 2022-10-10 DIAGNOSIS — J9621 Acute and chronic respiratory failure with hypoxia: Secondary | ICD-10-CM | POA: Diagnosis not present

## 2022-10-10 DIAGNOSIS — L03116 Cellulitis of left lower limb: Secondary | ICD-10-CM | POA: Diagnosis not present

## 2022-10-10 DIAGNOSIS — J45909 Unspecified asthma, uncomplicated: Secondary | ICD-10-CM | POA: Diagnosis not present

## 2022-10-10 DIAGNOSIS — L97212 Non-pressure chronic ulcer of right calf with fat layer exposed: Secondary | ICD-10-CM | POA: Diagnosis not present

## 2022-10-10 DIAGNOSIS — I5022 Chronic systolic (congestive) heart failure: Secondary | ICD-10-CM | POA: Diagnosis not present

## 2022-10-10 DIAGNOSIS — Z955 Presence of coronary angioplasty implant and graft: Secondary | ICD-10-CM | POA: Diagnosis not present

## 2022-10-10 DIAGNOSIS — M6282 Rhabdomyolysis: Secondary | ICD-10-CM | POA: Diagnosis not present

## 2022-10-10 DIAGNOSIS — M1990 Unspecified osteoarthritis, unspecified site: Secondary | ICD-10-CM | POA: Diagnosis not present

## 2022-10-10 DIAGNOSIS — Z7902 Long term (current) use of antithrombotics/antiplatelets: Secondary | ICD-10-CM | POA: Diagnosis not present

## 2022-10-10 DIAGNOSIS — K219 Gastro-esophageal reflux disease without esophagitis: Secondary | ICD-10-CM | POA: Diagnosis not present

## 2022-10-10 DIAGNOSIS — Z7982 Long term (current) use of aspirin: Secondary | ICD-10-CM | POA: Diagnosis not present

## 2022-10-10 DIAGNOSIS — I252 Old myocardial infarction: Secondary | ICD-10-CM | POA: Diagnosis not present

## 2022-10-10 NOTE — Telephone Encounter (Signed)
Received [facility] note report. Gave to AA for signature-nm

## 2022-10-10 NOTE — Telephone Encounter (Signed)
No, they would need to see his PCP for this

## 2022-10-10 NOTE — Telephone Encounter (Signed)
Patient called to ask for a Rx written for a hospital bed to help elevate his legs. Home health nurse told him that we could write him one. Is that something you can do?

## 2022-10-11 ENCOUNTER — Telehealth: Payer: Self-pay | Admitting: Nurse Practitioner

## 2022-10-11 NOTE — Telephone Encounter (Signed)
10/07/22 order faxed back to [facility]; [phone]. Scanned-Toni

## 2022-10-11 NOTE — Telephone Encounter (Signed)
Called patient to tell him to ask his PCP

## 2022-10-12 ENCOUNTER — Telehealth: Payer: Self-pay | Admitting: Nurse Practitioner

## 2022-10-12 ENCOUNTER — Telehealth: Payer: Self-pay

## 2022-10-12 NOTE — Telephone Encounter (Signed)
Tabitha from [facility] called needing verbal order for wound care. Verbal order giving. [phone].

## 2022-10-12 NOTE — Telephone Encounter (Signed)
[facility] order signed. Faxed back; [phone]. To be scanned-nm

## 2022-10-12 NOTE — Telephone Encounter (Signed)
Received [facility] note report. Gave to AA for signature-nm

## 2022-10-13 ENCOUNTER — Telehealth (INDEPENDENT_AMBULATORY_CARE_PROVIDER_SITE_OTHER): Payer: Self-pay

## 2022-10-13 ENCOUNTER — Telehealth (INDEPENDENT_AMBULATORY_CARE_PROVIDER_SITE_OTHER): Payer: Self-pay | Admitting: Vascular Surgery

## 2022-10-13 DIAGNOSIS — J45909 Unspecified asthma, uncomplicated: Secondary | ICD-10-CM | POA: Diagnosis not present

## 2022-10-13 DIAGNOSIS — J9621 Acute and chronic respiratory failure with hypoxia: Secondary | ICD-10-CM | POA: Diagnosis not present

## 2022-10-13 DIAGNOSIS — I739 Peripheral vascular disease, unspecified: Secondary | ICD-10-CM | POA: Diagnosis not present

## 2022-10-13 DIAGNOSIS — Z7902 Long term (current) use of antithrombotics/antiplatelets: Secondary | ICD-10-CM | POA: Diagnosis not present

## 2022-10-13 DIAGNOSIS — L97212 Non-pressure chronic ulcer of right calf with fat layer exposed: Secondary | ICD-10-CM | POA: Diagnosis not present

## 2022-10-13 DIAGNOSIS — K219 Gastro-esophageal reflux disease without esophagitis: Secondary | ICD-10-CM | POA: Diagnosis not present

## 2022-10-13 DIAGNOSIS — L03116 Cellulitis of left lower limb: Secondary | ICD-10-CM | POA: Diagnosis not present

## 2022-10-13 DIAGNOSIS — M6282 Rhabdomyolysis: Secondary | ICD-10-CM | POA: Diagnosis not present

## 2022-10-13 DIAGNOSIS — I110 Hypertensive heart disease with heart failure: Secondary | ICD-10-CM | POA: Diagnosis not present

## 2022-10-13 DIAGNOSIS — I872 Venous insufficiency (chronic) (peripheral): Secondary | ICD-10-CM | POA: Diagnosis not present

## 2022-10-13 DIAGNOSIS — Z9181 History of falling: Secondary | ICD-10-CM | POA: Diagnosis not present

## 2022-10-13 DIAGNOSIS — I5022 Chronic systolic (congestive) heart failure: Secondary | ICD-10-CM | POA: Diagnosis not present

## 2022-10-13 DIAGNOSIS — M1990 Unspecified osteoarthritis, unspecified site: Secondary | ICD-10-CM | POA: Diagnosis not present

## 2022-10-13 DIAGNOSIS — Z7982 Long term (current) use of aspirin: Secondary | ICD-10-CM | POA: Diagnosis not present

## 2022-10-13 DIAGNOSIS — Z8701 Personal history of pneumonia (recurrent): Secondary | ICD-10-CM | POA: Diagnosis not present

## 2022-10-13 DIAGNOSIS — Z792 Long term (current) use of antibiotics: Secondary | ICD-10-CM | POA: Diagnosis not present

## 2022-10-13 DIAGNOSIS — Z955 Presence of coronary angioplasty implant and graft: Secondary | ICD-10-CM | POA: Diagnosis not present

## 2022-10-13 DIAGNOSIS — N390 Urinary tract infection, site not specified: Secondary | ICD-10-CM | POA: Diagnosis not present

## 2022-10-13 DIAGNOSIS — J4489 Other specified chronic obstructive pulmonary disease: Secondary | ICD-10-CM | POA: Diagnosis not present

## 2022-10-13 DIAGNOSIS — I2510 Atherosclerotic heart disease of native coronary artery without angina pectoris: Secondary | ICD-10-CM | POA: Diagnosis not present

## 2022-10-13 DIAGNOSIS — I252 Old myocardial infarction: Secondary | ICD-10-CM | POA: Diagnosis not present

## 2022-10-13 DIAGNOSIS — L03115 Cellulitis of right lower limb: Secondary | ICD-10-CM | POA: Diagnosis not present

## 2022-10-13 NOTE — Telephone Encounter (Signed)
[facility] home health nurse Merry Proud left a message requesting verbal orders right lower extremity unna wraps to be done twice a week for ulcers. Also Merry Proud requested for ABIs to faxed. Per Dr Delana Meyer never got his post stent ABI's. So we could get him in for just an ABI and if it is ok then he could have unna boots. Currently we working on having the patient to be schedule for ABIs. Home health will be able to do unna wraps.

## 2022-10-14 ENCOUNTER — Telehealth: Payer: Self-pay

## 2022-10-14 NOTE — Telephone Encounter (Signed)
Connie from [facility] called asking for Therapy for 1 time a week for 6 weeks. Verbal orders giving. [phone]

## 2022-10-17 DIAGNOSIS — J439 Emphysema, unspecified: Secondary | ICD-10-CM | POA: Diagnosis not present

## 2022-10-17 DIAGNOSIS — J449 Chronic obstructive pulmonary disease, unspecified: Secondary | ICD-10-CM | POA: Diagnosis not present

## 2022-10-18 DIAGNOSIS — N390 Urinary tract infection, site not specified: Secondary | ICD-10-CM | POA: Diagnosis not present

## 2022-10-18 DIAGNOSIS — M6282 Rhabdomyolysis: Secondary | ICD-10-CM | POA: Diagnosis not present

## 2022-10-18 DIAGNOSIS — I5022 Chronic systolic (congestive) heart failure: Secondary | ICD-10-CM | POA: Diagnosis not present

## 2022-10-18 DIAGNOSIS — I110 Hypertensive heart disease with heart failure: Secondary | ICD-10-CM | POA: Diagnosis not present

## 2022-10-18 DIAGNOSIS — I2510 Atherosclerotic heart disease of native coronary artery without angina pectoris: Secondary | ICD-10-CM | POA: Diagnosis not present

## 2022-10-18 DIAGNOSIS — J4489 Other specified chronic obstructive pulmonary disease: Secondary | ICD-10-CM | POA: Diagnosis not present

## 2022-10-18 DIAGNOSIS — M1990 Unspecified osteoarthritis, unspecified site: Secondary | ICD-10-CM | POA: Diagnosis not present

## 2022-10-18 DIAGNOSIS — K219 Gastro-esophageal reflux disease without esophagitis: Secondary | ICD-10-CM | POA: Diagnosis not present

## 2022-10-18 DIAGNOSIS — Z792 Long term (current) use of antibiotics: Secondary | ICD-10-CM | POA: Diagnosis not present

## 2022-10-18 DIAGNOSIS — Z955 Presence of coronary angioplasty implant and graft: Secondary | ICD-10-CM | POA: Diagnosis not present

## 2022-10-18 DIAGNOSIS — L97212 Non-pressure chronic ulcer of right calf with fat layer exposed: Secondary | ICD-10-CM | POA: Diagnosis not present

## 2022-10-18 DIAGNOSIS — J9621 Acute and chronic respiratory failure with hypoxia: Secondary | ICD-10-CM | POA: Diagnosis not present

## 2022-10-18 DIAGNOSIS — L03116 Cellulitis of left lower limb: Secondary | ICD-10-CM | POA: Diagnosis not present

## 2022-10-18 DIAGNOSIS — I872 Venous insufficiency (chronic) (peripheral): Secondary | ICD-10-CM | POA: Diagnosis not present

## 2022-10-18 DIAGNOSIS — Z7902 Long term (current) use of antithrombotics/antiplatelets: Secondary | ICD-10-CM | POA: Diagnosis not present

## 2022-10-18 DIAGNOSIS — Z7982 Long term (current) use of aspirin: Secondary | ICD-10-CM | POA: Diagnosis not present

## 2022-10-18 DIAGNOSIS — L03115 Cellulitis of right lower limb: Secondary | ICD-10-CM | POA: Diagnosis not present

## 2022-10-18 DIAGNOSIS — J45909 Unspecified asthma, uncomplicated: Secondary | ICD-10-CM | POA: Diagnosis not present

## 2022-10-18 DIAGNOSIS — Z9181 History of falling: Secondary | ICD-10-CM | POA: Diagnosis not present

## 2022-10-18 DIAGNOSIS — I739 Peripheral vascular disease, unspecified: Secondary | ICD-10-CM | POA: Diagnosis not present

## 2022-10-18 DIAGNOSIS — I252 Old myocardial infarction: Secondary | ICD-10-CM | POA: Diagnosis not present

## 2022-10-18 DIAGNOSIS — Z8701 Personal history of pneumonia (recurrent): Secondary | ICD-10-CM | POA: Diagnosis not present

## 2022-10-25 ENCOUNTER — Other Ambulatory Visit: Payer: Self-pay | Admitting: Internal Medicine

## 2022-10-25 DIAGNOSIS — J45909 Unspecified asthma, uncomplicated: Secondary | ICD-10-CM | POA: Diagnosis not present

## 2022-10-25 DIAGNOSIS — I739 Peripheral vascular disease, unspecified: Secondary | ICD-10-CM | POA: Diagnosis not present

## 2022-10-25 DIAGNOSIS — M6282 Rhabdomyolysis: Secondary | ICD-10-CM | POA: Diagnosis not present

## 2022-10-25 DIAGNOSIS — L97212 Non-pressure chronic ulcer of right calf with fat layer exposed: Secondary | ICD-10-CM | POA: Diagnosis not present

## 2022-10-25 DIAGNOSIS — M1990 Unspecified osteoarthritis, unspecified site: Secondary | ICD-10-CM | POA: Diagnosis not present

## 2022-10-25 DIAGNOSIS — K219 Gastro-esophageal reflux disease without esophagitis: Secondary | ICD-10-CM | POA: Diagnosis not present

## 2022-10-25 DIAGNOSIS — Z792 Long term (current) use of antibiotics: Secondary | ICD-10-CM | POA: Diagnosis not present

## 2022-10-25 DIAGNOSIS — Z8701 Personal history of pneumonia (recurrent): Secondary | ICD-10-CM | POA: Diagnosis not present

## 2022-10-25 DIAGNOSIS — Z955 Presence of coronary angioplasty implant and graft: Secondary | ICD-10-CM | POA: Diagnosis not present

## 2022-10-25 DIAGNOSIS — I252 Old myocardial infarction: Secondary | ICD-10-CM | POA: Diagnosis not present

## 2022-10-25 DIAGNOSIS — L03116 Cellulitis of left lower limb: Secondary | ICD-10-CM | POA: Diagnosis not present

## 2022-10-25 DIAGNOSIS — I110 Hypertensive heart disease with heart failure: Secondary | ICD-10-CM | POA: Diagnosis not present

## 2022-10-25 DIAGNOSIS — Z9181 History of falling: Secondary | ICD-10-CM | POA: Diagnosis not present

## 2022-10-25 DIAGNOSIS — J4489 Other specified chronic obstructive pulmonary disease: Secondary | ICD-10-CM | POA: Diagnosis not present

## 2022-10-25 DIAGNOSIS — N390 Urinary tract infection, site not specified: Secondary | ICD-10-CM | POA: Diagnosis not present

## 2022-10-25 DIAGNOSIS — I5022 Chronic systolic (congestive) heart failure: Secondary | ICD-10-CM | POA: Diagnosis not present

## 2022-10-25 DIAGNOSIS — Z7982 Long term (current) use of aspirin: Secondary | ICD-10-CM | POA: Diagnosis not present

## 2022-10-25 DIAGNOSIS — L03115 Cellulitis of right lower limb: Secondary | ICD-10-CM | POA: Diagnosis not present

## 2022-10-25 DIAGNOSIS — I872 Venous insufficiency (chronic) (peripheral): Secondary | ICD-10-CM | POA: Diagnosis not present

## 2022-10-25 DIAGNOSIS — J9621 Acute and chronic respiratory failure with hypoxia: Secondary | ICD-10-CM | POA: Diagnosis not present

## 2022-10-25 DIAGNOSIS — J449 Chronic obstructive pulmonary disease, unspecified: Secondary | ICD-10-CM

## 2022-10-25 DIAGNOSIS — Z7902 Long term (current) use of antithrombotics/antiplatelets: Secondary | ICD-10-CM | POA: Diagnosis not present

## 2022-10-25 DIAGNOSIS — I2510 Atherosclerotic heart disease of native coronary artery without angina pectoris: Secondary | ICD-10-CM | POA: Diagnosis not present

## 2022-10-28 DIAGNOSIS — Z792 Long term (current) use of antibiotics: Secondary | ICD-10-CM | POA: Diagnosis not present

## 2022-10-28 DIAGNOSIS — I110 Hypertensive heart disease with heart failure: Secondary | ICD-10-CM | POA: Diagnosis not present

## 2022-10-28 DIAGNOSIS — I2510 Atherosclerotic heart disease of native coronary artery without angina pectoris: Secondary | ICD-10-CM | POA: Diagnosis not present

## 2022-10-28 DIAGNOSIS — Z955 Presence of coronary angioplasty implant and graft: Secondary | ICD-10-CM | POA: Diagnosis not present

## 2022-10-28 DIAGNOSIS — L03115 Cellulitis of right lower limb: Secondary | ICD-10-CM | POA: Diagnosis not present

## 2022-10-28 DIAGNOSIS — L03116 Cellulitis of left lower limb: Secondary | ICD-10-CM | POA: Diagnosis not present

## 2022-10-28 DIAGNOSIS — Z9181 History of falling: Secondary | ICD-10-CM | POA: Diagnosis not present

## 2022-10-28 DIAGNOSIS — Z8701 Personal history of pneumonia (recurrent): Secondary | ICD-10-CM | POA: Diagnosis not present

## 2022-10-28 DIAGNOSIS — L97212 Non-pressure chronic ulcer of right calf with fat layer exposed: Secondary | ICD-10-CM | POA: Diagnosis not present

## 2022-10-28 DIAGNOSIS — I5022 Chronic systolic (congestive) heart failure: Secondary | ICD-10-CM | POA: Diagnosis not present

## 2022-10-28 DIAGNOSIS — M6282 Rhabdomyolysis: Secondary | ICD-10-CM | POA: Diagnosis not present

## 2022-10-28 DIAGNOSIS — Z7902 Long term (current) use of antithrombotics/antiplatelets: Secondary | ICD-10-CM | POA: Diagnosis not present

## 2022-10-28 DIAGNOSIS — I252 Old myocardial infarction: Secondary | ICD-10-CM | POA: Diagnosis not present

## 2022-10-28 DIAGNOSIS — J45909 Unspecified asthma, uncomplicated: Secondary | ICD-10-CM | POA: Diagnosis not present

## 2022-10-28 DIAGNOSIS — M1990 Unspecified osteoarthritis, unspecified site: Secondary | ICD-10-CM | POA: Diagnosis not present

## 2022-10-28 DIAGNOSIS — Z7982 Long term (current) use of aspirin: Secondary | ICD-10-CM | POA: Diagnosis not present

## 2022-10-28 DIAGNOSIS — J9621 Acute and chronic respiratory failure with hypoxia: Secondary | ICD-10-CM | POA: Diagnosis not present

## 2022-10-28 DIAGNOSIS — N390 Urinary tract infection, site not specified: Secondary | ICD-10-CM | POA: Diagnosis not present

## 2022-10-28 DIAGNOSIS — I872 Venous insufficiency (chronic) (peripheral): Secondary | ICD-10-CM | POA: Diagnosis not present

## 2022-10-28 DIAGNOSIS — J4489 Other specified chronic obstructive pulmonary disease: Secondary | ICD-10-CM | POA: Diagnosis not present

## 2022-10-28 DIAGNOSIS — K219 Gastro-esophageal reflux disease without esophagitis: Secondary | ICD-10-CM | POA: Diagnosis not present

## 2022-10-28 DIAGNOSIS — I739 Peripheral vascular disease, unspecified: Secondary | ICD-10-CM | POA: Diagnosis not present

## 2022-10-31 ENCOUNTER — Encounter (INDEPENDENT_AMBULATORY_CARE_PROVIDER_SITE_OTHER): Payer: HMO

## 2022-10-31 ENCOUNTER — Ambulatory Visit (INDEPENDENT_AMBULATORY_CARE_PROVIDER_SITE_OTHER): Payer: HMO | Admitting: Vascular Surgery

## 2022-11-01 DIAGNOSIS — J4489 Other specified chronic obstructive pulmonary disease: Secondary | ICD-10-CM | POA: Diagnosis not present

## 2022-11-01 DIAGNOSIS — Z955 Presence of coronary angioplasty implant and graft: Secondary | ICD-10-CM | POA: Diagnosis not present

## 2022-11-01 DIAGNOSIS — I110 Hypertensive heart disease with heart failure: Secondary | ICD-10-CM | POA: Diagnosis not present

## 2022-11-01 DIAGNOSIS — I872 Venous insufficiency (chronic) (peripheral): Secondary | ICD-10-CM | POA: Diagnosis not present

## 2022-11-01 DIAGNOSIS — N390 Urinary tract infection, site not specified: Secondary | ICD-10-CM | POA: Diagnosis not present

## 2022-11-01 DIAGNOSIS — I252 Old myocardial infarction: Secondary | ICD-10-CM | POA: Diagnosis not present

## 2022-11-01 DIAGNOSIS — J45909 Unspecified asthma, uncomplicated: Secondary | ICD-10-CM | POA: Diagnosis not present

## 2022-11-01 DIAGNOSIS — M1990 Unspecified osteoarthritis, unspecified site: Secondary | ICD-10-CM | POA: Diagnosis not present

## 2022-11-01 DIAGNOSIS — K219 Gastro-esophageal reflux disease without esophagitis: Secondary | ICD-10-CM | POA: Diagnosis not present

## 2022-11-01 DIAGNOSIS — Z7982 Long term (current) use of aspirin: Secondary | ICD-10-CM | POA: Diagnosis not present

## 2022-11-01 DIAGNOSIS — Z8701 Personal history of pneumonia (recurrent): Secondary | ICD-10-CM | POA: Diagnosis not present

## 2022-11-01 DIAGNOSIS — I739 Peripheral vascular disease, unspecified: Secondary | ICD-10-CM | POA: Diagnosis not present

## 2022-11-01 DIAGNOSIS — L03116 Cellulitis of left lower limb: Secondary | ICD-10-CM | POA: Diagnosis not present

## 2022-11-01 DIAGNOSIS — M6282 Rhabdomyolysis: Secondary | ICD-10-CM | POA: Diagnosis not present

## 2022-11-01 DIAGNOSIS — L97212 Non-pressure chronic ulcer of right calf with fat layer exposed: Secondary | ICD-10-CM | POA: Diagnosis not present

## 2022-11-01 DIAGNOSIS — L03115 Cellulitis of right lower limb: Secondary | ICD-10-CM | POA: Diagnosis not present

## 2022-11-01 DIAGNOSIS — Z7902 Long term (current) use of antithrombotics/antiplatelets: Secondary | ICD-10-CM | POA: Diagnosis not present

## 2022-11-01 DIAGNOSIS — J9621 Acute and chronic respiratory failure with hypoxia: Secondary | ICD-10-CM | POA: Diagnosis not present

## 2022-11-01 DIAGNOSIS — I5022 Chronic systolic (congestive) heart failure: Secondary | ICD-10-CM | POA: Diagnosis not present

## 2022-11-01 DIAGNOSIS — Z792 Long term (current) use of antibiotics: Secondary | ICD-10-CM | POA: Diagnosis not present

## 2022-11-01 DIAGNOSIS — I2510 Atherosclerotic heart disease of native coronary artery without angina pectoris: Secondary | ICD-10-CM | POA: Diagnosis not present

## 2022-11-01 DIAGNOSIS — Z9181 History of falling: Secondary | ICD-10-CM | POA: Diagnosis not present

## 2022-11-03 DIAGNOSIS — L03115 Cellulitis of right lower limb: Secondary | ICD-10-CM | POA: Diagnosis not present

## 2022-11-03 DIAGNOSIS — K219 Gastro-esophageal reflux disease without esophagitis: Secondary | ICD-10-CM | POA: Diagnosis not present

## 2022-11-03 DIAGNOSIS — Z7982 Long term (current) use of aspirin: Secondary | ICD-10-CM | POA: Diagnosis not present

## 2022-11-03 DIAGNOSIS — I5022 Chronic systolic (congestive) heart failure: Secondary | ICD-10-CM | POA: Diagnosis not present

## 2022-11-03 DIAGNOSIS — Z7902 Long term (current) use of antithrombotics/antiplatelets: Secondary | ICD-10-CM | POA: Diagnosis not present

## 2022-11-03 DIAGNOSIS — N390 Urinary tract infection, site not specified: Secondary | ICD-10-CM | POA: Diagnosis not present

## 2022-11-03 DIAGNOSIS — Z8701 Personal history of pneumonia (recurrent): Secondary | ICD-10-CM | POA: Diagnosis not present

## 2022-11-03 DIAGNOSIS — J9621 Acute and chronic respiratory failure with hypoxia: Secondary | ICD-10-CM | POA: Diagnosis not present

## 2022-11-03 DIAGNOSIS — L97212 Non-pressure chronic ulcer of right calf with fat layer exposed: Secondary | ICD-10-CM | POA: Diagnosis not present

## 2022-11-03 DIAGNOSIS — I2510 Atherosclerotic heart disease of native coronary artery without angina pectoris: Secondary | ICD-10-CM | POA: Diagnosis not present

## 2022-11-03 DIAGNOSIS — J4489 Other specified chronic obstructive pulmonary disease: Secondary | ICD-10-CM | POA: Diagnosis not present

## 2022-11-03 DIAGNOSIS — M1990 Unspecified osteoarthritis, unspecified site: Secondary | ICD-10-CM | POA: Diagnosis not present

## 2022-11-03 DIAGNOSIS — L03116 Cellulitis of left lower limb: Secondary | ICD-10-CM | POA: Diagnosis not present

## 2022-11-03 DIAGNOSIS — J45909 Unspecified asthma, uncomplicated: Secondary | ICD-10-CM | POA: Diagnosis not present

## 2022-11-03 DIAGNOSIS — I739 Peripheral vascular disease, unspecified: Secondary | ICD-10-CM | POA: Diagnosis not present

## 2022-11-03 DIAGNOSIS — I872 Venous insufficiency (chronic) (peripheral): Secondary | ICD-10-CM | POA: Diagnosis not present

## 2022-11-03 DIAGNOSIS — M6282 Rhabdomyolysis: Secondary | ICD-10-CM | POA: Diagnosis not present

## 2022-11-03 DIAGNOSIS — Z9181 History of falling: Secondary | ICD-10-CM | POA: Diagnosis not present

## 2022-11-03 DIAGNOSIS — Z955 Presence of coronary angioplasty implant and graft: Secondary | ICD-10-CM | POA: Diagnosis not present

## 2022-11-03 DIAGNOSIS — I110 Hypertensive heart disease with heart failure: Secondary | ICD-10-CM | POA: Diagnosis not present

## 2022-11-03 DIAGNOSIS — Z792 Long term (current) use of antibiotics: Secondary | ICD-10-CM | POA: Diagnosis not present

## 2022-11-03 DIAGNOSIS — I252 Old myocardial infarction: Secondary | ICD-10-CM | POA: Diagnosis not present

## 2022-11-07 DIAGNOSIS — I2510 Atherosclerotic heart disease of native coronary artery without angina pectoris: Secondary | ICD-10-CM | POA: Diagnosis not present

## 2022-11-07 DIAGNOSIS — M6282 Rhabdomyolysis: Secondary | ICD-10-CM | POA: Diagnosis not present

## 2022-11-07 DIAGNOSIS — I5022 Chronic systolic (congestive) heart failure: Secondary | ICD-10-CM | POA: Diagnosis not present

## 2022-11-07 DIAGNOSIS — J4489 Other specified chronic obstructive pulmonary disease: Secondary | ICD-10-CM | POA: Diagnosis not present

## 2022-11-07 DIAGNOSIS — M1990 Unspecified osteoarthritis, unspecified site: Secondary | ICD-10-CM | POA: Diagnosis not present

## 2022-11-07 DIAGNOSIS — N390 Urinary tract infection, site not specified: Secondary | ICD-10-CM | POA: Diagnosis not present

## 2022-11-07 DIAGNOSIS — J45909 Unspecified asthma, uncomplicated: Secondary | ICD-10-CM | POA: Diagnosis not present

## 2022-11-07 DIAGNOSIS — Z792 Long term (current) use of antibiotics: Secondary | ICD-10-CM | POA: Diagnosis not present

## 2022-11-07 DIAGNOSIS — L97212 Non-pressure chronic ulcer of right calf with fat layer exposed: Secondary | ICD-10-CM | POA: Diagnosis not present

## 2022-11-07 DIAGNOSIS — J9621 Acute and chronic respiratory failure with hypoxia: Secondary | ICD-10-CM | POA: Diagnosis not present

## 2022-11-07 DIAGNOSIS — Z9181 History of falling: Secondary | ICD-10-CM | POA: Diagnosis not present

## 2022-11-07 DIAGNOSIS — K219 Gastro-esophageal reflux disease without esophagitis: Secondary | ICD-10-CM | POA: Diagnosis not present

## 2022-11-07 DIAGNOSIS — Z7902 Long term (current) use of antithrombotics/antiplatelets: Secondary | ICD-10-CM | POA: Diagnosis not present

## 2022-11-07 DIAGNOSIS — I110 Hypertensive heart disease with heart failure: Secondary | ICD-10-CM | POA: Diagnosis not present

## 2022-11-07 DIAGNOSIS — Z955 Presence of coronary angioplasty implant and graft: Secondary | ICD-10-CM | POA: Diagnosis not present

## 2022-11-07 DIAGNOSIS — Z7982 Long term (current) use of aspirin: Secondary | ICD-10-CM | POA: Diagnosis not present

## 2022-11-07 DIAGNOSIS — I872 Venous insufficiency (chronic) (peripheral): Secondary | ICD-10-CM | POA: Diagnosis not present

## 2022-11-07 DIAGNOSIS — L03116 Cellulitis of left lower limb: Secondary | ICD-10-CM | POA: Diagnosis not present

## 2022-11-07 DIAGNOSIS — I252 Old myocardial infarction: Secondary | ICD-10-CM | POA: Diagnosis not present

## 2022-11-07 DIAGNOSIS — Z8701 Personal history of pneumonia (recurrent): Secondary | ICD-10-CM | POA: Diagnosis not present

## 2022-11-07 DIAGNOSIS — L03115 Cellulitis of right lower limb: Secondary | ICD-10-CM | POA: Diagnosis not present

## 2022-11-07 DIAGNOSIS — I739 Peripheral vascular disease, unspecified: Secondary | ICD-10-CM | POA: Diagnosis not present

## 2022-11-07 NOTE — Progress Notes (Signed)
Pt had concern for worsening hypoxia with EMS so workup was done to rule this out given no new hypoxia here- therefore B[facility] ordered to rule out CHF and resp panel order tor rule out viral illness.

## 2022-11-10 DIAGNOSIS — I252 Old myocardial infarction: Secondary | ICD-10-CM | POA: Diagnosis not present

## 2022-11-10 DIAGNOSIS — N390 Urinary tract infection, site not specified: Secondary | ICD-10-CM | POA: Diagnosis not present

## 2022-11-10 DIAGNOSIS — J4489 Other specified chronic obstructive pulmonary disease: Secondary | ICD-10-CM | POA: Diagnosis not present

## 2022-11-10 DIAGNOSIS — Z792 Long term (current) use of antibiotics: Secondary | ICD-10-CM | POA: Diagnosis not present

## 2022-11-10 DIAGNOSIS — M6282 Rhabdomyolysis: Secondary | ICD-10-CM | POA: Diagnosis not present

## 2022-11-10 DIAGNOSIS — I739 Peripheral vascular disease, unspecified: Secondary | ICD-10-CM | POA: Diagnosis not present

## 2022-11-10 DIAGNOSIS — M1990 Unspecified osteoarthritis, unspecified site: Secondary | ICD-10-CM | POA: Diagnosis not present

## 2022-11-10 DIAGNOSIS — J449 Chronic obstructive pulmonary disease, unspecified: Secondary | ICD-10-CM | POA: Diagnosis not present

## 2022-11-10 DIAGNOSIS — Z9181 History of falling: Secondary | ICD-10-CM | POA: Diagnosis not present

## 2022-11-10 DIAGNOSIS — Z8701 Personal history of pneumonia (recurrent): Secondary | ICD-10-CM | POA: Diagnosis not present

## 2022-11-10 DIAGNOSIS — J45909 Unspecified asthma, uncomplicated: Secondary | ICD-10-CM | POA: Diagnosis not present

## 2022-11-10 DIAGNOSIS — K219 Gastro-esophageal reflux disease without esophagitis: Secondary | ICD-10-CM | POA: Diagnosis not present

## 2022-11-10 DIAGNOSIS — L97212 Non-pressure chronic ulcer of right calf with fat layer exposed: Secondary | ICD-10-CM | POA: Diagnosis not present

## 2022-11-10 DIAGNOSIS — I872 Venous insufficiency (chronic) (peripheral): Secondary | ICD-10-CM | POA: Diagnosis not present

## 2022-11-10 DIAGNOSIS — I110 Hypertensive heart disease with heart failure: Secondary | ICD-10-CM | POA: Diagnosis not present

## 2022-11-10 DIAGNOSIS — Z7902 Long term (current) use of antithrombotics/antiplatelets: Secondary | ICD-10-CM | POA: Diagnosis not present

## 2022-11-10 DIAGNOSIS — L03115 Cellulitis of right lower limb: Secondary | ICD-10-CM | POA: Diagnosis not present

## 2022-11-10 DIAGNOSIS — Z955 Presence of coronary angioplasty implant and graft: Secondary | ICD-10-CM | POA: Diagnosis not present

## 2022-11-10 DIAGNOSIS — I2510 Atherosclerotic heart disease of native coronary artery without angina pectoris: Secondary | ICD-10-CM | POA: Diagnosis not present

## 2022-11-10 DIAGNOSIS — L03116 Cellulitis of left lower limb: Secondary | ICD-10-CM | POA: Diagnosis not present

## 2022-11-10 DIAGNOSIS — I5022 Chronic systolic (congestive) heart failure: Secondary | ICD-10-CM | POA: Diagnosis not present

## 2022-11-10 DIAGNOSIS — Z7982 Long term (current) use of aspirin: Secondary | ICD-10-CM | POA: Diagnosis not present

## 2022-11-10 DIAGNOSIS — J9621 Acute and chronic respiratory failure with hypoxia: Secondary | ICD-10-CM | POA: Diagnosis not present

## 2022-11-11 ENCOUNTER — Encounter (INDEPENDENT_AMBULATORY_CARE_PROVIDER_SITE_OTHER): Payer: HMO

## 2022-11-11 ENCOUNTER — Ambulatory Visit (INDEPENDENT_AMBULATORY_CARE_PROVIDER_SITE_OTHER): Payer: HMO | Admitting: Nurse Practitioner

## 2022-11-15 DIAGNOSIS — Z9181 History of falling: Secondary | ICD-10-CM | POA: Diagnosis not present

## 2022-11-15 DIAGNOSIS — I872 Venous insufficiency (chronic) (peripheral): Secondary | ICD-10-CM | POA: Diagnosis not present

## 2022-11-15 DIAGNOSIS — L03116 Cellulitis of left lower limb: Secondary | ICD-10-CM | POA: Diagnosis not present

## 2022-11-15 DIAGNOSIS — Z7982 Long term (current) use of aspirin: Secondary | ICD-10-CM | POA: Diagnosis not present

## 2022-11-15 DIAGNOSIS — Z8701 Personal history of pneumonia (recurrent): Secondary | ICD-10-CM | POA: Diagnosis not present

## 2022-11-15 DIAGNOSIS — I252 Old myocardial infarction: Secondary | ICD-10-CM | POA: Diagnosis not present

## 2022-11-15 DIAGNOSIS — J9621 Acute and chronic respiratory failure with hypoxia: Secondary | ICD-10-CM | POA: Diagnosis not present

## 2022-11-15 DIAGNOSIS — J45909 Unspecified asthma, uncomplicated: Secondary | ICD-10-CM | POA: Diagnosis not present

## 2022-11-15 DIAGNOSIS — I110 Hypertensive heart disease with heart failure: Secondary | ICD-10-CM | POA: Diagnosis not present

## 2022-11-15 DIAGNOSIS — I2510 Atherosclerotic heart disease of native coronary artery without angina pectoris: Secondary | ICD-10-CM | POA: Diagnosis not present

## 2022-11-15 DIAGNOSIS — J4489 Other specified chronic obstructive pulmonary disease: Secondary | ICD-10-CM | POA: Diagnosis not present

## 2022-11-15 DIAGNOSIS — I739 Peripheral vascular disease, unspecified: Secondary | ICD-10-CM | POA: Diagnosis not present

## 2022-11-15 DIAGNOSIS — L97212 Non-pressure chronic ulcer of right calf with fat layer exposed: Secondary | ICD-10-CM | POA: Diagnosis not present

## 2022-11-15 DIAGNOSIS — M6282 Rhabdomyolysis: Secondary | ICD-10-CM | POA: Diagnosis not present

## 2022-11-15 DIAGNOSIS — Z955 Presence of coronary angioplasty implant and graft: Secondary | ICD-10-CM | POA: Diagnosis not present

## 2022-11-15 DIAGNOSIS — M1990 Unspecified osteoarthritis, unspecified site: Secondary | ICD-10-CM | POA: Diagnosis not present

## 2022-11-15 DIAGNOSIS — I5022 Chronic systolic (congestive) heart failure: Secondary | ICD-10-CM | POA: Diagnosis not present

## 2022-11-15 DIAGNOSIS — Z792 Long term (current) use of antibiotics: Secondary | ICD-10-CM | POA: Diagnosis not present

## 2022-11-15 DIAGNOSIS — L03115 Cellulitis of right lower limb: Secondary | ICD-10-CM | POA: Diagnosis not present

## 2022-11-15 DIAGNOSIS — Z7902 Long term (current) use of antithrombotics/antiplatelets: Secondary | ICD-10-CM | POA: Diagnosis not present

## 2022-11-15 DIAGNOSIS — N390 Urinary tract infection, site not specified: Secondary | ICD-10-CM | POA: Diagnosis not present

## 2022-11-15 DIAGNOSIS — K219 Gastro-esophageal reflux disease without esophagitis: Secondary | ICD-10-CM | POA: Diagnosis not present

## 2022-11-16 ENCOUNTER — Telehealth: Payer: Self-pay | Admitting: Nurse Practitioner

## 2022-11-16 NOTE — Telephone Encounter (Signed)
Received [facility] home health form 10/12/22 for AA signature. [facility] form signed. Faxed back; [phone]. To be scanned-nm

## 2022-11-17 ENCOUNTER — Telehealth: Payer: Self-pay | Admitting: Nurse Practitioner

## 2022-11-17 ENCOUNTER — Other Ambulatory Visit: Payer: Self-pay | Admitting: Nurse Practitioner

## 2022-11-17 DIAGNOSIS — M1990 Unspecified osteoarthritis, unspecified site: Secondary | ICD-10-CM | POA: Diagnosis not present

## 2022-11-17 DIAGNOSIS — L03116 Cellulitis of left lower limb: Secondary | ICD-10-CM | POA: Diagnosis not present

## 2022-11-17 DIAGNOSIS — I110 Hypertensive heart disease with heart failure: Secondary | ICD-10-CM | POA: Diagnosis not present

## 2022-11-17 DIAGNOSIS — I2510 Atherosclerotic heart disease of native coronary artery without angina pectoris: Secondary | ICD-10-CM | POA: Diagnosis not present

## 2022-11-17 DIAGNOSIS — J45909 Unspecified asthma, uncomplicated: Secondary | ICD-10-CM | POA: Diagnosis not present

## 2022-11-17 DIAGNOSIS — I872 Venous insufficiency (chronic) (peripheral): Secondary | ICD-10-CM | POA: Diagnosis not present

## 2022-11-17 DIAGNOSIS — I5022 Chronic systolic (congestive) heart failure: Secondary | ICD-10-CM | POA: Diagnosis not present

## 2022-11-17 DIAGNOSIS — M6282 Rhabdomyolysis: Secondary | ICD-10-CM | POA: Diagnosis not present

## 2022-11-17 DIAGNOSIS — J9621 Acute and chronic respiratory failure with hypoxia: Secondary | ICD-10-CM | POA: Diagnosis not present

## 2022-11-17 DIAGNOSIS — Z7902 Long term (current) use of antithrombotics/antiplatelets: Secondary | ICD-10-CM | POA: Diagnosis not present

## 2022-11-17 DIAGNOSIS — N390 Urinary tract infection, site not specified: Secondary | ICD-10-CM | POA: Diagnosis not present

## 2022-11-17 DIAGNOSIS — Z8701 Personal history of pneumonia (recurrent): Secondary | ICD-10-CM | POA: Diagnosis not present

## 2022-11-17 DIAGNOSIS — I252 Old myocardial infarction: Secondary | ICD-10-CM | POA: Diagnosis not present

## 2022-11-17 DIAGNOSIS — Z7982 Long term (current) use of aspirin: Secondary | ICD-10-CM | POA: Diagnosis not present

## 2022-11-17 DIAGNOSIS — I739 Peripheral vascular disease, unspecified: Secondary | ICD-10-CM | POA: Diagnosis not present

## 2022-11-17 DIAGNOSIS — Z955 Presence of coronary angioplasty implant and graft: Secondary | ICD-10-CM | POA: Diagnosis not present

## 2022-11-17 DIAGNOSIS — L97212 Non-pressure chronic ulcer of right calf with fat layer exposed: Secondary | ICD-10-CM | POA: Diagnosis not present

## 2022-11-17 DIAGNOSIS — Z792 Long term (current) use of antibiotics: Secondary | ICD-10-CM | POA: Diagnosis not present

## 2022-11-17 DIAGNOSIS — Z9181 History of falling: Secondary | ICD-10-CM | POA: Diagnosis not present

## 2022-11-17 DIAGNOSIS — J4489 Other specified chronic obstructive pulmonary disease: Secondary | ICD-10-CM | POA: Diagnosis not present

## 2022-11-17 DIAGNOSIS — K219 Gastro-esophageal reflux disease without esophagitis: Secondary | ICD-10-CM | POA: Diagnosis not present

## 2022-11-17 DIAGNOSIS — L03115 Cellulitis of right lower limb: Secondary | ICD-10-CM | POA: Diagnosis not present

## 2022-11-17 NOTE — Telephone Encounter (Signed)
Received [facility] form 07/20/22 for AA signature. [facility] form already signed. Re faxed back; [phone]

## 2022-11-18 DIAGNOSIS — I2510 Atherosclerotic heart disease of native coronary artery without angina pectoris: Secondary | ICD-10-CM | POA: Diagnosis not present

## 2022-11-18 DIAGNOSIS — I110 Hypertensive heart disease with heart failure: Secondary | ICD-10-CM | POA: Diagnosis not present

## 2022-11-18 DIAGNOSIS — Z792 Long term (current) use of antibiotics: Secondary | ICD-10-CM | POA: Diagnosis not present

## 2022-11-18 DIAGNOSIS — J9621 Acute and chronic respiratory failure with hypoxia: Secondary | ICD-10-CM | POA: Diagnosis not present

## 2022-11-18 DIAGNOSIS — J45909 Unspecified asthma, uncomplicated: Secondary | ICD-10-CM | POA: Diagnosis not present

## 2022-11-18 DIAGNOSIS — I872 Venous insufficiency (chronic) (peripheral): Secondary | ICD-10-CM | POA: Diagnosis not present

## 2022-11-18 DIAGNOSIS — M1990 Unspecified osteoarthritis, unspecified site: Secondary | ICD-10-CM | POA: Diagnosis not present

## 2022-11-18 DIAGNOSIS — Z8701 Personal history of pneumonia (recurrent): Secondary | ICD-10-CM | POA: Diagnosis not present

## 2022-11-18 DIAGNOSIS — I5022 Chronic systolic (congestive) heart failure: Secondary | ICD-10-CM | POA: Diagnosis not present

## 2022-11-18 DIAGNOSIS — M6282 Rhabdomyolysis: Secondary | ICD-10-CM | POA: Diagnosis not present

## 2022-11-18 DIAGNOSIS — Z9181 History of falling: Secondary | ICD-10-CM | POA: Diagnosis not present

## 2022-11-18 DIAGNOSIS — Z7982 Long term (current) use of aspirin: Secondary | ICD-10-CM | POA: Diagnosis not present

## 2022-11-18 DIAGNOSIS — L03116 Cellulitis of left lower limb: Secondary | ICD-10-CM | POA: Diagnosis not present

## 2022-11-18 DIAGNOSIS — I739 Peripheral vascular disease, unspecified: Secondary | ICD-10-CM | POA: Diagnosis not present

## 2022-11-18 DIAGNOSIS — L03115 Cellulitis of right lower limb: Secondary | ICD-10-CM | POA: Diagnosis not present

## 2022-11-18 DIAGNOSIS — I252 Old myocardial infarction: Secondary | ICD-10-CM | POA: Diagnosis not present

## 2022-11-18 DIAGNOSIS — N390 Urinary tract infection, site not specified: Secondary | ICD-10-CM | POA: Diagnosis not present

## 2022-11-18 DIAGNOSIS — L97212 Non-pressure chronic ulcer of right calf with fat layer exposed: Secondary | ICD-10-CM | POA: Diagnosis not present

## 2022-11-18 DIAGNOSIS — K219 Gastro-esophageal reflux disease without esophagitis: Secondary | ICD-10-CM | POA: Diagnosis not present

## 2022-11-18 DIAGNOSIS — J4489 Other specified chronic obstructive pulmonary disease: Secondary | ICD-10-CM | POA: Diagnosis not present

## 2022-11-18 DIAGNOSIS — Z7902 Long term (current) use of antithrombotics/antiplatelets: Secondary | ICD-10-CM | POA: Diagnosis not present

## 2022-11-18 DIAGNOSIS — Z955 Presence of coronary angioplasty implant and graft: Secondary | ICD-10-CM | POA: Diagnosis not present

## 2022-11-22 ENCOUNTER — Telehealth (INDEPENDENT_AMBULATORY_CARE_PROVIDER_SITE_OTHER): Payer: Self-pay

## 2022-11-22 DIAGNOSIS — Z9181 History of falling: Secondary | ICD-10-CM | POA: Diagnosis not present

## 2022-11-22 DIAGNOSIS — M6282 Rhabdomyolysis: Secondary | ICD-10-CM | POA: Diagnosis not present

## 2022-11-22 DIAGNOSIS — J4489 Other specified chronic obstructive pulmonary disease: Secondary | ICD-10-CM | POA: Diagnosis not present

## 2022-11-22 DIAGNOSIS — K219 Gastro-esophageal reflux disease without esophagitis: Secondary | ICD-10-CM | POA: Diagnosis not present

## 2022-11-22 DIAGNOSIS — L97212 Non-pressure chronic ulcer of right calf with fat layer exposed: Secondary | ICD-10-CM | POA: Diagnosis not present

## 2022-11-22 DIAGNOSIS — I110 Hypertensive heart disease with heart failure: Secondary | ICD-10-CM | POA: Diagnosis not present

## 2022-11-22 DIAGNOSIS — I2510 Atherosclerotic heart disease of native coronary artery without angina pectoris: Secondary | ICD-10-CM | POA: Diagnosis not present

## 2022-11-22 DIAGNOSIS — L03115 Cellulitis of right lower limb: Secondary | ICD-10-CM | POA: Diagnosis not present

## 2022-11-22 DIAGNOSIS — Z7902 Long term (current) use of antithrombotics/antiplatelets: Secondary | ICD-10-CM | POA: Diagnosis not present

## 2022-11-22 DIAGNOSIS — J45909 Unspecified asthma, uncomplicated: Secondary | ICD-10-CM | POA: Diagnosis not present

## 2022-11-22 DIAGNOSIS — J9621 Acute and chronic respiratory failure with hypoxia: Secondary | ICD-10-CM | POA: Diagnosis not present

## 2022-11-22 DIAGNOSIS — N390 Urinary tract infection, site not specified: Secondary | ICD-10-CM | POA: Diagnosis not present

## 2022-11-22 DIAGNOSIS — I739 Peripheral vascular disease, unspecified: Secondary | ICD-10-CM | POA: Diagnosis not present

## 2022-11-22 DIAGNOSIS — I5022 Chronic systolic (congestive) heart failure: Secondary | ICD-10-CM | POA: Diagnosis not present

## 2022-11-22 DIAGNOSIS — Z955 Presence of coronary angioplasty implant and graft: Secondary | ICD-10-CM | POA: Diagnosis not present

## 2022-11-22 DIAGNOSIS — I872 Venous insufficiency (chronic) (peripheral): Secondary | ICD-10-CM | POA: Diagnosis not present

## 2022-11-22 DIAGNOSIS — Z7982 Long term (current) use of aspirin: Secondary | ICD-10-CM | POA: Diagnosis not present

## 2022-11-22 DIAGNOSIS — M1990 Unspecified osteoarthritis, unspecified site: Secondary | ICD-10-CM | POA: Diagnosis not present

## 2022-11-22 DIAGNOSIS — L03116 Cellulitis of left lower limb: Secondary | ICD-10-CM | POA: Diagnosis not present

## 2022-11-22 DIAGNOSIS — Z8701 Personal history of pneumonia (recurrent): Secondary | ICD-10-CM | POA: Diagnosis not present

## 2022-11-22 DIAGNOSIS — Z792 Long term (current) use of antibiotics: Secondary | ICD-10-CM | POA: Diagnosis not present

## 2022-11-22 DIAGNOSIS — I252 Old myocardial infarction: Secondary | ICD-10-CM | POA: Diagnosis not present

## 2022-11-22 NOTE — Telephone Encounter (Signed)
Patient called about a medication refill of torsemide that was filled at another clinic by a another Dr.. I returned his call to inform him, but he stated the meds had came in the mail.

## 2022-11-23 DIAGNOSIS — N390 Urinary tract infection, site not specified: Secondary | ICD-10-CM | POA: Diagnosis not present

## 2022-11-23 DIAGNOSIS — L97212 Non-pressure chronic ulcer of right calf with fat layer exposed: Secondary | ICD-10-CM | POA: Diagnosis not present

## 2022-11-23 DIAGNOSIS — J45909 Unspecified asthma, uncomplicated: Secondary | ICD-10-CM | POA: Diagnosis not present

## 2022-11-23 DIAGNOSIS — Z7902 Long term (current) use of antithrombotics/antiplatelets: Secondary | ICD-10-CM | POA: Diagnosis not present

## 2022-11-23 DIAGNOSIS — Z792 Long term (current) use of antibiotics: Secondary | ICD-10-CM | POA: Diagnosis not present

## 2022-11-23 DIAGNOSIS — I872 Venous insufficiency (chronic) (peripheral): Secondary | ICD-10-CM | POA: Diagnosis not present

## 2022-11-23 DIAGNOSIS — L03115 Cellulitis of right lower limb: Secondary | ICD-10-CM | POA: Diagnosis not present

## 2022-11-23 DIAGNOSIS — J4489 Other specified chronic obstructive pulmonary disease: Secondary | ICD-10-CM | POA: Diagnosis not present

## 2022-11-23 DIAGNOSIS — M6282 Rhabdomyolysis: Secondary | ICD-10-CM | POA: Diagnosis not present

## 2022-11-23 DIAGNOSIS — I2510 Atherosclerotic heart disease of native coronary artery without angina pectoris: Secondary | ICD-10-CM | POA: Diagnosis not present

## 2022-11-23 DIAGNOSIS — K219 Gastro-esophageal reflux disease without esophagitis: Secondary | ICD-10-CM | POA: Diagnosis not present

## 2022-11-23 DIAGNOSIS — I252 Old myocardial infarction: Secondary | ICD-10-CM | POA: Diagnosis not present

## 2022-11-23 DIAGNOSIS — I5022 Chronic systolic (congestive) heart failure: Secondary | ICD-10-CM | POA: Diagnosis not present

## 2022-11-23 DIAGNOSIS — L03116 Cellulitis of left lower limb: Secondary | ICD-10-CM | POA: Diagnosis not present

## 2022-11-23 DIAGNOSIS — Z7982 Long term (current) use of aspirin: Secondary | ICD-10-CM | POA: Diagnosis not present

## 2022-11-23 DIAGNOSIS — Z8701 Personal history of pneumonia (recurrent): Secondary | ICD-10-CM | POA: Diagnosis not present

## 2022-11-23 DIAGNOSIS — J9621 Acute and chronic respiratory failure with hypoxia: Secondary | ICD-10-CM | POA: Diagnosis not present

## 2022-11-23 DIAGNOSIS — I739 Peripheral vascular disease, unspecified: Secondary | ICD-10-CM | POA: Diagnosis not present

## 2022-11-23 DIAGNOSIS — Z9181 History of falling: Secondary | ICD-10-CM | POA: Diagnosis not present

## 2022-11-23 DIAGNOSIS — M1990 Unspecified osteoarthritis, unspecified site: Secondary | ICD-10-CM | POA: Diagnosis not present

## 2022-11-23 DIAGNOSIS — I110 Hypertensive heart disease with heart failure: Secondary | ICD-10-CM | POA: Diagnosis not present

## 2022-11-23 DIAGNOSIS — Z955 Presence of coronary angioplasty implant and graft: Secondary | ICD-10-CM | POA: Diagnosis not present

## 2022-11-24 DIAGNOSIS — M6282 Rhabdomyolysis: Secondary | ICD-10-CM | POA: Diagnosis not present

## 2022-11-24 DIAGNOSIS — I739 Peripheral vascular disease, unspecified: Secondary | ICD-10-CM | POA: Diagnosis not present

## 2022-11-24 DIAGNOSIS — J45909 Unspecified asthma, uncomplicated: Secondary | ICD-10-CM | POA: Diagnosis not present

## 2022-11-24 DIAGNOSIS — I110 Hypertensive heart disease with heart failure: Secondary | ICD-10-CM | POA: Diagnosis not present

## 2022-11-24 DIAGNOSIS — Z792 Long term (current) use of antibiotics: Secondary | ICD-10-CM | POA: Diagnosis not present

## 2022-11-24 DIAGNOSIS — Z8701 Personal history of pneumonia (recurrent): Secondary | ICD-10-CM | POA: Diagnosis not present

## 2022-11-24 DIAGNOSIS — I872 Venous insufficiency (chronic) (peripheral): Secondary | ICD-10-CM | POA: Diagnosis not present

## 2022-11-24 DIAGNOSIS — Z7902 Long term (current) use of antithrombotics/antiplatelets: Secondary | ICD-10-CM | POA: Diagnosis not present

## 2022-11-24 DIAGNOSIS — L03115 Cellulitis of right lower limb: Secondary | ICD-10-CM | POA: Diagnosis not present

## 2022-11-24 DIAGNOSIS — Z7982 Long term (current) use of aspirin: Secondary | ICD-10-CM | POA: Diagnosis not present

## 2022-11-24 DIAGNOSIS — L97212 Non-pressure chronic ulcer of right calf with fat layer exposed: Secondary | ICD-10-CM | POA: Diagnosis not present

## 2022-11-24 DIAGNOSIS — Z955 Presence of coronary angioplasty implant and graft: Secondary | ICD-10-CM | POA: Diagnosis not present

## 2022-11-24 DIAGNOSIS — I5022 Chronic systolic (congestive) heart failure: Secondary | ICD-10-CM | POA: Diagnosis not present

## 2022-11-24 DIAGNOSIS — I2510 Atherosclerotic heart disease of native coronary artery without angina pectoris: Secondary | ICD-10-CM | POA: Diagnosis not present

## 2022-11-24 DIAGNOSIS — J9621 Acute and chronic respiratory failure with hypoxia: Secondary | ICD-10-CM | POA: Diagnosis not present

## 2022-11-24 DIAGNOSIS — M1990 Unspecified osteoarthritis, unspecified site: Secondary | ICD-10-CM | POA: Diagnosis not present

## 2022-11-24 DIAGNOSIS — Z9181 History of falling: Secondary | ICD-10-CM | POA: Diagnosis not present

## 2022-11-24 DIAGNOSIS — J4489 Other specified chronic obstructive pulmonary disease: Secondary | ICD-10-CM | POA: Diagnosis not present

## 2022-11-24 DIAGNOSIS — I252 Old myocardial infarction: Secondary | ICD-10-CM | POA: Diagnosis not present

## 2022-11-24 DIAGNOSIS — L03116 Cellulitis of left lower limb: Secondary | ICD-10-CM | POA: Diagnosis not present

## 2022-11-24 DIAGNOSIS — K219 Gastro-esophageal reflux disease without esophagitis: Secondary | ICD-10-CM | POA: Diagnosis not present

## 2022-11-24 DIAGNOSIS — N390 Urinary tract infection, site not specified: Secondary | ICD-10-CM | POA: Diagnosis not present

## 2022-11-28 DIAGNOSIS — I110 Hypertensive heart disease with heart failure: Secondary | ICD-10-CM | POA: Diagnosis not present

## 2022-11-28 DIAGNOSIS — M6282 Rhabdomyolysis: Secondary | ICD-10-CM | POA: Diagnosis not present

## 2022-11-28 DIAGNOSIS — M1990 Unspecified osteoarthritis, unspecified site: Secondary | ICD-10-CM | POA: Diagnosis not present

## 2022-11-28 DIAGNOSIS — Z8701 Personal history of pneumonia (recurrent): Secondary | ICD-10-CM | POA: Diagnosis not present

## 2022-11-28 DIAGNOSIS — Z7902 Long term (current) use of antithrombotics/antiplatelets: Secondary | ICD-10-CM | POA: Diagnosis not present

## 2022-11-28 DIAGNOSIS — J45909 Unspecified asthma, uncomplicated: Secondary | ICD-10-CM | POA: Diagnosis not present

## 2022-11-28 DIAGNOSIS — Z955 Presence of coronary angioplasty implant and graft: Secondary | ICD-10-CM | POA: Diagnosis not present

## 2022-11-28 DIAGNOSIS — Z9181 History of falling: Secondary | ICD-10-CM | POA: Diagnosis not present

## 2022-11-28 DIAGNOSIS — I252 Old myocardial infarction: Secondary | ICD-10-CM | POA: Diagnosis not present

## 2022-11-28 DIAGNOSIS — I5022 Chronic systolic (congestive) heart failure: Secondary | ICD-10-CM | POA: Diagnosis not present

## 2022-11-28 DIAGNOSIS — L03116 Cellulitis of left lower limb: Secondary | ICD-10-CM | POA: Diagnosis not present

## 2022-11-28 DIAGNOSIS — K219 Gastro-esophageal reflux disease without esophagitis: Secondary | ICD-10-CM | POA: Diagnosis not present

## 2022-11-28 DIAGNOSIS — L03115 Cellulitis of right lower limb: Secondary | ICD-10-CM | POA: Diagnosis not present

## 2022-11-28 DIAGNOSIS — J9621 Acute and chronic respiratory failure with hypoxia: Secondary | ICD-10-CM | POA: Diagnosis not present

## 2022-11-28 DIAGNOSIS — I2510 Atherosclerotic heart disease of native coronary artery without angina pectoris: Secondary | ICD-10-CM | POA: Diagnosis not present

## 2022-11-28 DIAGNOSIS — Z792 Long term (current) use of antibiotics: Secondary | ICD-10-CM | POA: Diagnosis not present

## 2022-11-28 DIAGNOSIS — Z7982 Long term (current) use of aspirin: Secondary | ICD-10-CM | POA: Diagnosis not present

## 2022-11-28 DIAGNOSIS — I872 Venous insufficiency (chronic) (peripheral): Secondary | ICD-10-CM | POA: Diagnosis not present

## 2022-11-28 DIAGNOSIS — L97212 Non-pressure chronic ulcer of right calf with fat layer exposed: Secondary | ICD-10-CM | POA: Diagnosis not present

## 2022-11-28 DIAGNOSIS — I739 Peripheral vascular disease, unspecified: Secondary | ICD-10-CM | POA: Diagnosis not present

## 2022-11-28 DIAGNOSIS — J4489 Other specified chronic obstructive pulmonary disease: Secondary | ICD-10-CM | POA: Diagnosis not present

## 2022-11-28 DIAGNOSIS — N390 Urinary tract infection, site not specified: Secondary | ICD-10-CM | POA: Diagnosis not present

## 2022-11-29 DIAGNOSIS — M6282 Rhabdomyolysis: Secondary | ICD-10-CM | POA: Diagnosis not present

## 2022-11-29 DIAGNOSIS — Z955 Presence of coronary angioplasty implant and graft: Secondary | ICD-10-CM | POA: Diagnosis not present

## 2022-11-29 DIAGNOSIS — L03115 Cellulitis of right lower limb: Secondary | ICD-10-CM | POA: Diagnosis not present

## 2022-11-29 DIAGNOSIS — Z9181 History of falling: Secondary | ICD-10-CM | POA: Diagnosis not present

## 2022-11-29 DIAGNOSIS — I252 Old myocardial infarction: Secondary | ICD-10-CM | POA: Diagnosis not present

## 2022-11-29 DIAGNOSIS — L03116 Cellulitis of left lower limb: Secondary | ICD-10-CM | POA: Diagnosis not present

## 2022-11-29 DIAGNOSIS — J45909 Unspecified asthma, uncomplicated: Secondary | ICD-10-CM | POA: Diagnosis not present

## 2022-11-29 DIAGNOSIS — I110 Hypertensive heart disease with heart failure: Secondary | ICD-10-CM | POA: Diagnosis not present

## 2022-11-29 DIAGNOSIS — I5022 Chronic systolic (congestive) heart failure: Secondary | ICD-10-CM | POA: Diagnosis not present

## 2022-11-29 DIAGNOSIS — Z7982 Long term (current) use of aspirin: Secondary | ICD-10-CM | POA: Diagnosis not present

## 2022-11-29 DIAGNOSIS — I872 Venous insufficiency (chronic) (peripheral): Secondary | ICD-10-CM | POA: Diagnosis not present

## 2022-11-29 DIAGNOSIS — N390 Urinary tract infection, site not specified: Secondary | ICD-10-CM | POA: Diagnosis not present

## 2022-11-29 DIAGNOSIS — I2510 Atherosclerotic heart disease of native coronary artery without angina pectoris: Secondary | ICD-10-CM | POA: Diagnosis not present

## 2022-11-29 DIAGNOSIS — J9621 Acute and chronic respiratory failure with hypoxia: Secondary | ICD-10-CM | POA: Diagnosis not present

## 2022-11-29 DIAGNOSIS — M1990 Unspecified osteoarthritis, unspecified site: Secondary | ICD-10-CM | POA: Diagnosis not present

## 2022-11-29 DIAGNOSIS — Z7902 Long term (current) use of antithrombotics/antiplatelets: Secondary | ICD-10-CM | POA: Diagnosis not present

## 2022-11-29 DIAGNOSIS — K219 Gastro-esophageal reflux disease without esophagitis: Secondary | ICD-10-CM | POA: Diagnosis not present

## 2022-11-29 DIAGNOSIS — I739 Peripheral vascular disease, unspecified: Secondary | ICD-10-CM | POA: Diagnosis not present

## 2022-11-29 DIAGNOSIS — Z792 Long term (current) use of antibiotics: Secondary | ICD-10-CM | POA: Diagnosis not present

## 2022-11-29 DIAGNOSIS — J4489 Other specified chronic obstructive pulmonary disease: Secondary | ICD-10-CM | POA: Diagnosis not present

## 2022-11-29 DIAGNOSIS — Z8701 Personal history of pneumonia (recurrent): Secondary | ICD-10-CM | POA: Diagnosis not present

## 2022-11-29 DIAGNOSIS — L97212 Non-pressure chronic ulcer of right calf with fat layer exposed: Secondary | ICD-10-CM | POA: Diagnosis not present

## 2022-11-30 ENCOUNTER — Telehealth: Payer: Self-pay

## 2022-11-30 NOTE — Telephone Encounter (Signed)
Erik [name] from [facility] ([phone]) called asking for OT extension for 1 week 6. Verbal order given.

## 2022-12-01 DIAGNOSIS — M1990 Unspecified osteoarthritis, unspecified site: Secondary | ICD-10-CM | POA: Diagnosis not present

## 2022-12-01 DIAGNOSIS — I110 Hypertensive heart disease with heart failure: Secondary | ICD-10-CM | POA: Diagnosis not present

## 2022-12-01 DIAGNOSIS — J4489 Other specified chronic obstructive pulmonary disease: Secondary | ICD-10-CM | POA: Diagnosis not present

## 2022-12-01 DIAGNOSIS — L97212 Non-pressure chronic ulcer of right calf with fat layer exposed: Secondary | ICD-10-CM | POA: Diagnosis not present

## 2022-12-01 DIAGNOSIS — I5022 Chronic systolic (congestive) heart failure: Secondary | ICD-10-CM | POA: Diagnosis not present

## 2022-12-01 DIAGNOSIS — Z8701 Personal history of pneumonia (recurrent): Secondary | ICD-10-CM | POA: Diagnosis not present

## 2022-12-01 DIAGNOSIS — K219 Gastro-esophageal reflux disease without esophagitis: Secondary | ICD-10-CM | POA: Diagnosis not present

## 2022-12-01 DIAGNOSIS — Z9181 History of falling: Secondary | ICD-10-CM | POA: Diagnosis not present

## 2022-12-01 DIAGNOSIS — Z7902 Long term (current) use of antithrombotics/antiplatelets: Secondary | ICD-10-CM | POA: Diagnosis not present

## 2022-12-01 DIAGNOSIS — I739 Peripheral vascular disease, unspecified: Secondary | ICD-10-CM | POA: Diagnosis not present

## 2022-12-01 DIAGNOSIS — I872 Venous insufficiency (chronic) (peripheral): Secondary | ICD-10-CM | POA: Diagnosis not present

## 2022-12-01 DIAGNOSIS — L03116 Cellulitis of left lower limb: Secondary | ICD-10-CM | POA: Diagnosis not present

## 2022-12-01 DIAGNOSIS — Z792 Long term (current) use of antibiotics: Secondary | ICD-10-CM | POA: Diagnosis not present

## 2022-12-01 DIAGNOSIS — L03115 Cellulitis of right lower limb: Secondary | ICD-10-CM | POA: Diagnosis not present

## 2022-12-01 DIAGNOSIS — I2510 Atherosclerotic heart disease of native coronary artery without angina pectoris: Secondary | ICD-10-CM | POA: Diagnosis not present

## 2022-12-01 DIAGNOSIS — Z955 Presence of coronary angioplasty implant and graft: Secondary | ICD-10-CM | POA: Diagnosis not present

## 2022-12-01 DIAGNOSIS — J45909 Unspecified asthma, uncomplicated: Secondary | ICD-10-CM | POA: Diagnosis not present

## 2022-12-01 DIAGNOSIS — M6282 Rhabdomyolysis: Secondary | ICD-10-CM | POA: Diagnosis not present

## 2022-12-01 DIAGNOSIS — J9621 Acute and chronic respiratory failure with hypoxia: Secondary | ICD-10-CM | POA: Diagnosis not present

## 2022-12-01 DIAGNOSIS — I252 Old myocardial infarction: Secondary | ICD-10-CM | POA: Diagnosis not present

## 2022-12-01 DIAGNOSIS — Z7982 Long term (current) use of aspirin: Secondary | ICD-10-CM | POA: Diagnosis not present

## 2022-12-01 DIAGNOSIS — N390 Urinary tract infection, site not specified: Secondary | ICD-10-CM | POA: Diagnosis not present

## 2022-12-02 ENCOUNTER — Ambulatory Visit (INDEPENDENT_AMBULATORY_CARE_PROVIDER_SITE_OTHER): Payer: HMO

## 2022-12-02 ENCOUNTER — Other Ambulatory Visit (INDEPENDENT_AMBULATORY_CARE_PROVIDER_SITE_OTHER): Payer: Self-pay | Admitting: Vascular Surgery

## 2022-12-02 ENCOUNTER — Encounter (INDEPENDENT_AMBULATORY_CARE_PROVIDER_SITE_OTHER): Payer: Self-pay | Admitting: Nurse Practitioner

## 2022-12-02 ENCOUNTER — Ambulatory Visit (INDEPENDENT_AMBULATORY_CARE_PROVIDER_SITE_OTHER): Payer: HMO | Admitting: Nurse Practitioner

## 2022-12-02 VITALS — BP 106/71 | HR 104 | Resp 16

## 2022-12-02 DIAGNOSIS — I739 Peripheral vascular disease, unspecified: Secondary | ICD-10-CM

## 2022-12-02 DIAGNOSIS — L97909 Non-pressure chronic ulcer of unspecified part of unspecified lower leg with unspecified severity: Secondary | ICD-10-CM | POA: Diagnosis not present

## 2022-12-02 DIAGNOSIS — I10 Essential (primary) hypertension: Secondary | ICD-10-CM | POA: Diagnosis not present

## 2022-12-02 DIAGNOSIS — I83009 Varicose veins of unspecified lower extremity with ulcer of unspecified site: Secondary | ICD-10-CM

## 2022-12-02 DIAGNOSIS — Z9889 Other specified postprocedural states: Secondary | ICD-10-CM

## 2022-12-02 MED ORDER — CLOPIDOGREL BISULFATE 75 MG PO TABS: 75.0000 mg | ORAL_TABLET | Freq: Every day | ORAL | 6 refills | Status: DC

## 2022-12-05 DIAGNOSIS — I252 Old myocardial infarction: Secondary | ICD-10-CM | POA: Diagnosis not present

## 2022-12-05 DIAGNOSIS — I110 Hypertensive heart disease with heart failure: Secondary | ICD-10-CM | POA: Diagnosis not present

## 2022-12-05 DIAGNOSIS — Z955 Presence of coronary angioplasty implant and graft: Secondary | ICD-10-CM | POA: Diagnosis not present

## 2022-12-05 DIAGNOSIS — M6282 Rhabdomyolysis: Secondary | ICD-10-CM | POA: Diagnosis not present

## 2022-12-05 DIAGNOSIS — I2510 Atherosclerotic heart disease of native coronary artery without angina pectoris: Secondary | ICD-10-CM | POA: Diagnosis not present

## 2022-12-05 DIAGNOSIS — L03116 Cellulitis of left lower limb: Secondary | ICD-10-CM | POA: Diagnosis not present

## 2022-12-05 DIAGNOSIS — Z7982 Long term (current) use of aspirin: Secondary | ICD-10-CM | POA: Diagnosis not present

## 2022-12-05 DIAGNOSIS — L97212 Non-pressure chronic ulcer of right calf with fat layer exposed: Secondary | ICD-10-CM | POA: Diagnosis not present

## 2022-12-05 DIAGNOSIS — J45909 Unspecified asthma, uncomplicated: Secondary | ICD-10-CM | POA: Diagnosis not present

## 2022-12-05 DIAGNOSIS — Z9181 History of falling: Secondary | ICD-10-CM | POA: Diagnosis not present

## 2022-12-05 DIAGNOSIS — N390 Urinary tract infection, site not specified: Secondary | ICD-10-CM | POA: Diagnosis not present

## 2022-12-05 DIAGNOSIS — I872 Venous insufficiency (chronic) (peripheral): Secondary | ICD-10-CM | POA: Diagnosis not present

## 2022-12-05 DIAGNOSIS — I5022 Chronic systolic (congestive) heart failure: Secondary | ICD-10-CM | POA: Diagnosis not present

## 2022-12-05 DIAGNOSIS — Z8701 Personal history of pneumonia (recurrent): Secondary | ICD-10-CM | POA: Diagnosis not present

## 2022-12-05 DIAGNOSIS — Z7902 Long term (current) use of antithrombotics/antiplatelets: Secondary | ICD-10-CM | POA: Diagnosis not present

## 2022-12-05 DIAGNOSIS — M1990 Unspecified osteoarthritis, unspecified site: Secondary | ICD-10-CM | POA: Diagnosis not present

## 2022-12-05 DIAGNOSIS — Z792 Long term (current) use of antibiotics: Secondary | ICD-10-CM | POA: Diagnosis not present

## 2022-12-05 DIAGNOSIS — J4489 Other specified chronic obstructive pulmonary disease: Secondary | ICD-10-CM | POA: Diagnosis not present

## 2022-12-05 DIAGNOSIS — L03115 Cellulitis of right lower limb: Secondary | ICD-10-CM | POA: Diagnosis not present

## 2022-12-05 DIAGNOSIS — K219 Gastro-esophageal reflux disease without esophagitis: Secondary | ICD-10-CM | POA: Diagnosis not present

## 2022-12-05 DIAGNOSIS — I739 Peripheral vascular disease, unspecified: Secondary | ICD-10-CM | POA: Diagnosis not present

## 2022-12-05 DIAGNOSIS — J9621 Acute and chronic respiratory failure with hypoxia: Secondary | ICD-10-CM | POA: Diagnosis not present

## 2022-12-05 LAB — VAS US ABI WITH/WO TBI
Left ABI: 1.27
Right ABI: 1.07

## 2022-12-07 NOTE — Telephone Encounter (Signed)
Error

## 2022-12-08 DIAGNOSIS — Z955 Presence of coronary angioplasty implant and graft: Secondary | ICD-10-CM | POA: Diagnosis not present

## 2022-12-08 DIAGNOSIS — J45909 Unspecified asthma, uncomplicated: Secondary | ICD-10-CM | POA: Diagnosis not present

## 2022-12-08 DIAGNOSIS — Z792 Long term (current) use of antibiotics: Secondary | ICD-10-CM | POA: Diagnosis not present

## 2022-12-08 DIAGNOSIS — J9621 Acute and chronic respiratory failure with hypoxia: Secondary | ICD-10-CM | POA: Diagnosis not present

## 2022-12-08 DIAGNOSIS — L03115 Cellulitis of right lower limb: Secondary | ICD-10-CM | POA: Diagnosis not present

## 2022-12-08 DIAGNOSIS — I872 Venous insufficiency (chronic) (peripheral): Secondary | ICD-10-CM | POA: Diagnosis not present

## 2022-12-08 DIAGNOSIS — M1990 Unspecified osteoarthritis, unspecified site: Secondary | ICD-10-CM | POA: Diagnosis not present

## 2022-12-08 DIAGNOSIS — I110 Hypertensive heart disease with heart failure: Secondary | ICD-10-CM | POA: Diagnosis not present

## 2022-12-08 DIAGNOSIS — Z8701 Personal history of pneumonia (recurrent): Secondary | ICD-10-CM | POA: Diagnosis not present

## 2022-12-08 DIAGNOSIS — K219 Gastro-esophageal reflux disease without esophagitis: Secondary | ICD-10-CM | POA: Diagnosis not present

## 2022-12-08 DIAGNOSIS — I5022 Chronic systolic (congestive) heart failure: Secondary | ICD-10-CM | POA: Diagnosis not present

## 2022-12-08 DIAGNOSIS — I739 Peripheral vascular disease, unspecified: Secondary | ICD-10-CM | POA: Diagnosis not present

## 2022-12-08 DIAGNOSIS — N390 Urinary tract infection, site not specified: Secondary | ICD-10-CM | POA: Diagnosis not present

## 2022-12-08 DIAGNOSIS — Z7982 Long term (current) use of aspirin: Secondary | ICD-10-CM | POA: Diagnosis not present

## 2022-12-08 DIAGNOSIS — I2510 Atherosclerotic heart disease of native coronary artery without angina pectoris: Secondary | ICD-10-CM | POA: Diagnosis not present

## 2022-12-08 DIAGNOSIS — I252 Old myocardial infarction: Secondary | ICD-10-CM | POA: Diagnosis not present

## 2022-12-08 DIAGNOSIS — Z9181 History of falling: Secondary | ICD-10-CM | POA: Diagnosis not present

## 2022-12-08 DIAGNOSIS — L97212 Non-pressure chronic ulcer of right calf with fat layer exposed: Secondary | ICD-10-CM | POA: Diagnosis not present

## 2022-12-08 DIAGNOSIS — L03116 Cellulitis of left lower limb: Secondary | ICD-10-CM | POA: Diagnosis not present

## 2022-12-08 DIAGNOSIS — M6282 Rhabdomyolysis: Secondary | ICD-10-CM | POA: Diagnosis not present

## 2022-12-08 DIAGNOSIS — Z7902 Long term (current) use of antithrombotics/antiplatelets: Secondary | ICD-10-CM | POA: Diagnosis not present

## 2022-12-08 DIAGNOSIS — J4489 Other specified chronic obstructive pulmonary disease: Secondary | ICD-10-CM | POA: Diagnosis not present

## 2022-12-19 ENCOUNTER — Encounter (INDEPENDENT_AMBULATORY_CARE_PROVIDER_SITE_OTHER): Payer: Self-pay | Admitting: Nurse Practitioner

## 2022-12-19 NOTE — Progress Notes (Signed)
Subjective:    Patient ID: Erik [name], male    DOB: Jul 30, 1956, 67 y.o.   MRN: [id number] Chief Complaint  Patient presents with   Follow-up    Ultrasound follow up    Erik [name] is a 67 year old male who returns today for follow-up of noninvasive studies.  The patient has issues with lower extremity edema and he has been in Unna boots for some significant time.  He notes that he will have ulcers that heal and recur.  In this particular instance the ulcerations are little bit more deep.  With his ongoing interaction continue to assess his circulation to ensure that it is adequate for ongoing Unna boots.  He underwent revascularization of the right lower extremity on 08/11/2022.  This was helpful with wound healing today the patient has ABI of 1.07 on the right and 1.27 on the left.  Additional arterial duplex shows primarily biphasic waveforms in the right and a patent stent in the right lower extremity, there biphasic tibial waveforms of the left    Review of Systems  Skin:  Positive for wound.  Neurological:  Positive for weakness.  All other systems reviewed and are negative.      Objective:   Physical Exam Vitals reviewed.  HENT:     Head: Normocephalic.  Cardiovascular:     Rate and Rhythm: Normal rate.     Pulses:          Dorsalis pedis pulses are detected w/ Doppler on the right side and detected w/ Doppler on the left side.       Posterior tibial pulses are detected w/ Doppler on the right side and detected w/ Doppler on the left side.  Pulmonary:     Effort: Pulmonary effort is normal.  Skin:    General: Skin is warm and dry.  Neurological:     Mental Status: He is alert and oriented to person, place, and time.     Motor: Weakness present.     Gait: Gait abnormal.  Psychiatric:        Mood and Affect: Mood normal.        Behavior: Behavior normal.        Thought Content: Thought content normal.        Judgment: Judgment normal.     BP 106/71 (BP  Location: Left Arm)   Pulse (!) 104   Resp 16   Past Medical History:  Diagnosis Date   Arthritis    Asthma    Atrial fibrillation (HCC)    CHF (congestive heart failure) (HCC)    COPD (chronic obstructive pulmonary disease) (HCC)    Coronary artery disease    GERD (gastroesophageal reflux disease)    Hypertension    Myocardial infarction (HCC)    Shortness of breath dyspnea     Social History   Socioeconomic History   Marital status: Married    Spouse name: Not on file   Number of children: Not on file   Years of education: Not on file   Highest education level: Not on file  Occupational History   Not on file  Tobacco Use   Smoking status: Former    Years: 25    Types: Cigarettes    Quit date: 03/20/1997    Years since quitting: 25.7   Smokeless tobacco: Never  Vaping Use   Vaping Use: Never used  Substance and Sexual Activity   Alcohol use: No   Drug use: No   Sexual activity:  Not on file  Other Topics Concern   Not on file  Social History Narrative   Not on file   Social Determinants of Health   Financial Resource Strain: Low Risk  (11/04/2020)   Overall Financial Resource Strain (CARDIA)    Difficulty of Paying Living Expenses: Not hard at all  Food Insecurity: No Food Insecurity (07/14/2022)   Hunger Vital Sign    Worried About Running Out of Food in the Last Year: Never true    Ran Out of Food in the Last Year: Never true  Transportation Needs: No Transportation Needs (07/14/2022)   PRAPARE - Administrator, Civil Service (Medical): No    Lack of Transportation (Non-Medical): No  Physical Activity: Not on file  Stress: Not on file  Social Connections: Not on file  Intimate Partner Violence: Not At Risk (07/14/2022)   Humiliation, Afraid, Rape, and Kick questionnaire    Fear of Current or Ex-Partner: No    Emotionally Abused: No    Physically Abused: No    Sexually Abused: No    Past Surgical History:  Procedure Laterality Date    ANGIOPLASTY  2009   CARDIAC CATHETERIZATION N/A 03/18/2015   Procedure: Left Heart Cath with Coronary Angiography;  Surgeon: Lamar Blinks, MD;  Location: [facility] INVASIVE CV LAB;  Service: Cardiovascular;  Laterality: N/A;   CHOLECYSTECTOMY  2005   CORONARY ANGIOGRAM  2009   LOWER EXTREMITY ANGIOGRAPHY Right 08/31/2022   Procedure: Lower Extremity Angiography;  Surgeon: Renford Dills, MD;  Location: [facility] INVASIVE CV LAB;  Service: Cardiovascular;  Laterality: Right;    Family History  Problem Relation Age of Onset   Coronary artery disease Father    Diabetes Father    Hyperlipidemia Father    Hypertension Father    Stroke Mother    Hyperlipidemia Mother     Allergies  Allergen Reactions   Benadryl [Diphenhydramine Hcl] Shortness Of Breath   Morphine And Related Swelling    Difficulty breathing per patient    Oxycodone Swelling    Severe mouth swelling requiring medical intervention       Latest Ref Rng & Units 09/25/2022   10:21 AM 09/07/2022    1:35 AM 09/06/2022    6:47 AM  CBC  WBC 4.0 - 10.5 K/uL 13.1  12.1  11.3   Hemoglobin 13.0 - 17.0 g/dL 40.9  81.1  91.4   Hematocrit 39.0 - 52.0 % 47.6  41.2  43.1   Platelets 150 - 400 K/uL 426  466  453       CMP     Component Value Date/Time   NA 134 (L) 09/25/2022 1021   NA 138 02/19/2020 0000   NA 135 (L) 07/19/2013 0417   K 3.4 (L) 09/25/2022 1021   K 3.9 07/19/2013 0417   CL 90 (L) 09/25/2022 1021   CL 101 07/19/2013 0417   CO2 35 (H) 09/25/2022 1021   CO2 29 07/19/2013 0417   GLUCOSE 119 (H) 09/25/2022 1021   GLUCOSE 86 07/19/2013 0417   BUN 17 09/25/2022 1021   BUN 14 02/19/2020 0000   BUN 10 07/19/2013 0417   CREATININE 0.82 09/25/2022 1021   CREATININE 0.80 07/19/2013 0417   CALCIUM 9.1 09/25/2022 1021   CALCIUM 8.4 (L) 07/19/2013 0417   PROT 7.0 09/25/2022 1021   PROT 6.3 02/19/2020 0000   PROT 6.6 07/19/2013 0417   ALBUMIN 3.5 09/25/2022 1021   ALBUMIN 4.2 02/19/2020 0000   ALBUMIN 2.8 (L)  07/19/2013 0417   AST 28 09/25/2022 1021   AST 33 07/19/2013 0417   ALT 19 09/25/2022 1021   ALT 32 07/19/2013 0417   ALKPHOS 121 09/25/2022 1021   ALKPHOS 85 07/19/2013 0417   BILITOT 1.0 09/25/2022 1021   BILITOT 0.4 02/19/2020 0000   BILITOT 1.6 (H) 07/19/2013 0417   GFRNONAA >60 09/25/2022 1021   GFRNONAA >60 07/19/2013 0417   GFRAA 105 02/19/2020 0000   GFRAA >60 07/19/2013 0417     VAS [place] ABI WITH/WO TBI  Result Date: 12/05/2022  LOWER EXTREMITY DOPPLER STUDY Patient Name:  KIRTIS AU  Date of Exam:   12/02/2022 Medical Rec #: [id number]       Accession #:    [id number] Date of Birth: May 19, 1956      Patient Gender: M Patient Age:   89 years Exam Location:  [facility] Procedure:      VAS [place] ABI WITH/WO TBI Referring Phys: [facility] --------------------------------------------------------------------------------  Indications: Rest pain, and ulceration. High Risk Factors: Hypertension, hyperlipidemia, past history of smoking. Other Factors: Cuffs placed on calves above open wounds.  Vascular Interventions: 08/31/2022 PTA and stent right common iliac artery, SFA,                         popliteal artery. Limitations: Today's exam was limited due to an open wound and bandages. Performing Technologist: Hardie Lora RVT  Examination Guidelines: A complete evaluation includes at minimum, Doppler waveform signals and systolic blood pressure reading at the level of bilateral brachial, anterior tibial, and posterior tibial arteries, when vessel segments are accessible. Bilateral testing is considered an integral part of a complete examination. Photoelectric Plethysmograph (PPG) waveforms and toe systolic pressure readings are included as required and additional duplex testing as needed. Limited examinations for reoccurring indications may be performed as noted.  ABI Findings: +---------+------------------+-----+----------+--------+ Right    Rt Pressure (mmHg)IndexWaveform   Comment  +---------+------------------+-----+----------+--------+ Brachial 115                                       +---------+------------------+-----+----------+--------+ PTA      107               0.92 monophasic         +---------+------------------+-----+----------+--------+ DP       124               1.07 monophasic         +---------+------------------+-----+----------+--------+ Great Toe119               1.03                    +---------+------------------+-----+----------+--------+ +---------+------------------+-----+--------+-------+ Left     Lt Pressure (mmHg)IndexWaveformComment +---------+------------------+-----+--------+-------+ Brachial 116                                    +---------+------------------+-----+--------+-------+ PTA      147               1.27 biphasic        +---------+------------------+-----+--------+-------+ DP       136               1.17 biphasic        +---------+------------------+-----+--------+-------+ Carin Hock  1.05                 +---------+------------------+-----+--------+-------+ +-------+-----------+-----------+------------+------------+ ABI/TBIToday's ABIToday's TBIPrevious ABIPrevious TBI +-------+-----------+-----------+------------+------------+ Right  1.07       1.03       0.96        1.01         +-------+-----------+-----------+------------+------------+ Left   1.27       1.05       1.19        0.90         +-------+-----------+-----------+------------+------------+ Bilateral ABIs appear essentially unchanged compared to prior study on 06/17/2020.  Summary: Right: Resting right ankle-brachial index is within normal range. The right toe-brachial index is normal. Although ankle brachial indices are within normal limits (0.95-1.29), arterial Doppler waveforms at the ankle suggest some component of arterial occlusive disease. Left: Resting left ankle-brachial index is  within normal range. The left toe-brachial index is normal. *See table(s) above for measurements and observations.  Electronically signed by Festus Barren MD on 12/05/2022 at 8:31:07 AM.    Final        Assessment & Plan:   1. Peripheral arterial disease with history of revascularization (HCC)  Recommend:  The patient has evidence of atherosclerosis of the lower extremities with claudication.  The patient does not voice lifestyle limiting changes at this point in time.  Noninvasive studies do not suggest clinically significant change.  No invasive studies, angiography or surgery at this time The patient should continue walking and begin a more formal exercise program.  The patient should continue antiplatelet therapy and aggressive treatment of the lipid abnormalities  No changes in the patient's medications at this time  Continued surveillance is indicated as atherosclerosis is likely to progress with time.    The patient will continue follow up with noninvasive studies as ordered.   2. Venous ulcer (HCC) The patient notes that the wounds come and go but in this instance the wound does appear to be deeper than previous.  Given that the wound is somewhat more deeper and extensive in this instance, we will refer to the wound center for further evaluation and treatment.  3. Essential hypertension with goal blood pressure less than 130/80 Continue antihypertensive medications as already ordered, these medications have been reviewed and there are no changes at this time.   Current Outpatient Medications on File Prior to Visit  Medication Sig Dispense Refill   acetaminophen (TYLENOL) 325 MG tablet Take 2 tablets (650 mg total) by mouth every 6 (six) hours as needed for mild pain (or Fever >/= 101).     albuterol (VENTOLIN HFA) 108 (90 Base) MCG/ACT inhaler Inhale 1 puff into the lungs every 6 (six) hours as needed for wheezing or shortness of breath. 18 g 1   aspirin EC 81 MG tablet Take 1  tablet (81 mg total) by mouth daily. Swallow whole. 30 tablet 12   gabapentin (NEURONTIN) 100 MG capsule Take 100 mg by mouth 3 (three) times daily.     hydrOXYzine (ATARAX) 25 MG tablet Take 1 tablet (25 mg total) by mouth 3 (three) times daily as needed for anxiety. 30 tablet 1   ipratropium-albuterol (DUONEB) 0.5-2.5 (3) MG/3ML SOLN USE 1 VIAL IN NEBULIZER EVERY 6 HOURS 120 mL 5   leptospermum manuka honey (MEDIHONEY) PSTE paste Apply 1 Application topically daily. 15 mL 1   metoprolol tartrate (LOPRESSOR) 100 MG tablet Take 1 tablet (100 mg total) by mouth 2 (two) times daily. 60 tablet 1  oxymetazoline (AFRIN) 0.05 % nasal spray Place 1 spray into both nostrils 2 (two) times daily. 30 mL 0   polyethylene glycol (MIRALAX / GLYCOLAX) 17 g packet Take 17 g by mouth daily. 14 each 0   TRELEGY ELLIPTA 100-62.5-25 MCG/ACT AEPB INHALE 1 PUFF BY MOUTH INTO LUNGS DAILY 60 each 3   torsemide (DEMADEX) 20 MG tablet Take 2 tablets (40 mg total) by mouth 2 (two) times daily for 3 days, THEN 2 tablets (40 mg total) daily. 72 tablet 0   No current facility-administered medications on file prior to visit.    There are no Patient Instructions on file for this visit. No follow-ups on file.   Georgiana Spinner, NP

## 2022-12-26 DIAGNOSIS — I252 Old myocardial infarction: Secondary | ICD-10-CM | POA: Diagnosis not present

## 2022-12-26 DIAGNOSIS — L03116 Cellulitis of left lower limb: Secondary | ICD-10-CM | POA: Diagnosis not present

## 2022-12-26 DIAGNOSIS — Z9181 History of falling: Secondary | ICD-10-CM | POA: Diagnosis not present

## 2022-12-26 DIAGNOSIS — J45909 Unspecified asthma, uncomplicated: Secondary | ICD-10-CM | POA: Diagnosis not present

## 2022-12-26 DIAGNOSIS — I110 Hypertensive heart disease with heart failure: Secondary | ICD-10-CM | POA: Diagnosis not present

## 2022-12-26 DIAGNOSIS — I872 Venous insufficiency (chronic) (peripheral): Secondary | ICD-10-CM | POA: Diagnosis not present

## 2022-12-26 DIAGNOSIS — Z8701 Personal history of pneumonia (recurrent): Secondary | ICD-10-CM | POA: Diagnosis not present

## 2022-12-26 DIAGNOSIS — Z7982 Long term (current) use of aspirin: Secondary | ICD-10-CM | POA: Diagnosis not present

## 2022-12-26 DIAGNOSIS — I2510 Atherosclerotic heart disease of native coronary artery without angina pectoris: Secondary | ICD-10-CM | POA: Diagnosis not present

## 2022-12-26 DIAGNOSIS — Z955 Presence of coronary angioplasty implant and graft: Secondary | ICD-10-CM | POA: Diagnosis not present

## 2022-12-26 DIAGNOSIS — L03115 Cellulitis of right lower limb: Secondary | ICD-10-CM | POA: Diagnosis not present

## 2022-12-26 DIAGNOSIS — M6282 Rhabdomyolysis: Secondary | ICD-10-CM | POA: Diagnosis not present

## 2022-12-26 DIAGNOSIS — N390 Urinary tract infection, site not specified: Secondary | ICD-10-CM | POA: Diagnosis not present

## 2022-12-26 DIAGNOSIS — M1990 Unspecified osteoarthritis, unspecified site: Secondary | ICD-10-CM | POA: Diagnosis not present

## 2022-12-26 DIAGNOSIS — J4489 Other specified chronic obstructive pulmonary disease: Secondary | ICD-10-CM | POA: Diagnosis not present

## 2022-12-26 DIAGNOSIS — I5022 Chronic systolic (congestive) heart failure: Secondary | ICD-10-CM | POA: Diagnosis not present

## 2022-12-26 DIAGNOSIS — Z7902 Long term (current) use of antithrombotics/antiplatelets: Secondary | ICD-10-CM | POA: Diagnosis not present

## 2022-12-26 DIAGNOSIS — K219 Gastro-esophageal reflux disease without esophagitis: Secondary | ICD-10-CM | POA: Diagnosis not present

## 2022-12-26 DIAGNOSIS — I739 Peripheral vascular disease, unspecified: Secondary | ICD-10-CM | POA: Diagnosis not present

## 2022-12-26 DIAGNOSIS — J9621 Acute and chronic respiratory failure with hypoxia: Secondary | ICD-10-CM | POA: Diagnosis not present

## 2022-12-26 DIAGNOSIS — L97212 Non-pressure chronic ulcer of right calf with fat layer exposed: Secondary | ICD-10-CM | POA: Diagnosis not present

## 2022-12-26 DIAGNOSIS — Z792 Long term (current) use of antibiotics: Secondary | ICD-10-CM | POA: Diagnosis not present

## 2022-12-28 ENCOUNTER — Ambulatory Visit: Payer: HMO | Admitting: Internal Medicine

## 2023-01-17 ENCOUNTER — Encounter: Payer: Medicare Other | Admitting: Nurse Practitioner

## 2023-01-26 ENCOUNTER — Other Ambulatory Visit: Payer: Self-pay | Admitting: Internal Medicine

## 2023-01-26 DIAGNOSIS — J449 Chronic obstructive pulmonary disease, unspecified: Secondary | ICD-10-CM

## 2023-01-27 NOTE — Telephone Encounter (Signed)
Patient needs an appointment for future med refills.

## 2023-01-31 ENCOUNTER — Encounter: Payer: Self-pay | Admitting: Nurse Practitioner

## 2023-02-06 ENCOUNTER — Telehealth: Payer: Self-pay | Admitting: Nurse Practitioner

## 2023-02-06 NOTE — Telephone Encounter (Signed)
Pt called wanting to reschedule appt, informed pt that he was warned if he reschedule pt will be dismissed from office. Pt stated he will see if he can make it to his appt-nm

## 2023-02-08 ENCOUNTER — Telehealth: Payer: Self-pay | Admitting: Internal Medicine

## 2023-02-08 ENCOUNTER — Telehealth: Payer: Self-pay | Admitting: Nurse Practitioner

## 2023-02-08 ENCOUNTER — Encounter: Payer: Self-pay | Admitting: Nurse Practitioner

## 2023-02-08 NOTE — Telephone Encounter (Signed)
Patient discharged from practice due to non-adherence to medical office appointments. Letter mailed to patient-Toni

## 2023-02-08 NOTE — Telephone Encounter (Signed)
I notified patient that if he misses today's appointment. He will be discharged from both pcp and pulmonary care due to numerous cancellations and no shows. He stated he will do his [place] to come to appointment-Toni

## 2023-02-16 ENCOUNTER — Other Ambulatory Visit: Payer: Self-pay

## 2023-02-16 ENCOUNTER — Inpatient Hospital Stay
Admission: EM | Admit: 2023-02-16 | Discharge: 2023-03-04 | DRG: 854 | Disposition: A | Discharge status: Skilled Nursing Facility | Payer: HMO | Attending: Internal Medicine | Admitting: Internal Medicine

## 2023-02-16 ENCOUNTER — Encounter: Payer: Self-pay | Admitting: Internal Medicine

## 2023-02-16 ENCOUNTER — Ambulatory Visit: Payer: HMO | Admitting: Physician Assistant

## 2023-02-16 ENCOUNTER — Emergency Department: Payer: HMO

## 2023-02-16 DIAGNOSIS — S32050A Wedge compression fracture of fifth lumbar vertebra, initial encounter for closed fracture: Secondary | ICD-10-CM | POA: Diagnosis not present

## 2023-02-16 DIAGNOSIS — S32030S Wedge compression fracture of third lumbar vertebra, sequela: Secondary | ICD-10-CM | POA: Diagnosis not present

## 2023-02-16 DIAGNOSIS — I10 Essential (primary) hypertension: Secondary | ICD-10-CM | POA: Diagnosis not present

## 2023-02-16 DIAGNOSIS — I5022 Chronic systolic (congestive) heart failure: Secondary | ICD-10-CM | POA: Diagnosis not present

## 2023-02-16 DIAGNOSIS — Z79899 Other long term (current) drug therapy: Secondary | ICD-10-CM

## 2023-02-16 DIAGNOSIS — I739 Peripheral vascular disease, unspecified: Secondary | ICD-10-CM | POA: Diagnosis present

## 2023-02-16 DIAGNOSIS — I110 Hypertensive heart disease with heart failure: Secondary | ICD-10-CM | POA: Diagnosis present

## 2023-02-16 DIAGNOSIS — Z7951 Long term (current) use of inhaled steroids: Secondary | ICD-10-CM | POA: Diagnosis not present

## 2023-02-16 DIAGNOSIS — S22080D Wedge compression fracture of T11-T12 vertebra, subsequent encounter for fracture with routine healing: Secondary | ICD-10-CM | POA: Diagnosis not present

## 2023-02-16 DIAGNOSIS — Z87891 Personal history of nicotine dependence: Secondary | ICD-10-CM | POA: Diagnosis not present

## 2023-02-16 DIAGNOSIS — Z823 Family history of stroke: Secondary | ICD-10-CM

## 2023-02-16 DIAGNOSIS — J441 Chronic obstructive pulmonary disease with (acute) exacerbation: Secondary | ICD-10-CM | POA: Diagnosis present

## 2023-02-16 DIAGNOSIS — L03116 Cellulitis of left lower limb: Secondary | ICD-10-CM | POA: Diagnosis not present

## 2023-02-16 DIAGNOSIS — M7989 Other specified soft tissue disorders: Secondary | ICD-10-CM | POA: Diagnosis present

## 2023-02-16 DIAGNOSIS — S32050S Wedge compression fracture of fifth lumbar vertebra, sequela: Secondary | ICD-10-CM | POA: Diagnosis not present

## 2023-02-16 DIAGNOSIS — I272 Pulmonary hypertension, unspecified: Secondary | ICD-10-CM | POA: Diagnosis not present

## 2023-02-16 DIAGNOSIS — Z1152 Encounter for screening for COVID-19: Secondary | ICD-10-CM

## 2023-02-16 DIAGNOSIS — E669 Obesity, unspecified: Secondary | ICD-10-CM | POA: Diagnosis not present

## 2023-02-16 DIAGNOSIS — Z91198 Patient's noncompliance with other medical treatment and regimen for other reason: Secondary | ICD-10-CM | POA: Diagnosis not present

## 2023-02-16 DIAGNOSIS — A419 Sepsis, unspecified organism: Principal | ICD-10-CM | POA: Diagnosis present

## 2023-02-16 DIAGNOSIS — I4891 Unspecified atrial fibrillation: Secondary | ICD-10-CM | POA: Diagnosis not present

## 2023-02-16 DIAGNOSIS — L03115 Cellulitis of right lower limb: Secondary | ICD-10-CM | POA: Diagnosis present

## 2023-02-16 DIAGNOSIS — J9811 Atelectasis: Secondary | ICD-10-CM | POA: Diagnosis not present

## 2023-02-16 DIAGNOSIS — E875 Hyperkalemia: Secondary | ICD-10-CM | POA: Diagnosis not present

## 2023-02-16 DIAGNOSIS — E874 Mixed disorder of acid-base balance: Secondary | ICD-10-CM | POA: Diagnosis present

## 2023-02-16 DIAGNOSIS — Z9981 Dependence on supplemental oxygen: Secondary | ICD-10-CM | POA: Diagnosis not present

## 2023-02-16 DIAGNOSIS — I87311 Chronic venous hypertension (idiopathic) with ulcer of right lower extremity: Secondary | ICD-10-CM | POA: Diagnosis not present

## 2023-02-16 DIAGNOSIS — I4821 Permanent atrial fibrillation: Secondary | ICD-10-CM | POA: Diagnosis present

## 2023-02-16 DIAGNOSIS — Z7982 Long term (current) use of aspirin: Secondary | ICD-10-CM | POA: Diagnosis not present

## 2023-02-16 DIAGNOSIS — G8929 Other chronic pain: Secondary | ICD-10-CM | POA: Diagnosis present

## 2023-02-16 DIAGNOSIS — I70238 Atherosclerosis of native arteries of right leg with ulceration of other part of lower right leg: Secondary | ICD-10-CM | POA: Diagnosis not present

## 2023-02-16 DIAGNOSIS — Z9049 Acquired absence of other specified parts of digestive tract: Secondary | ICD-10-CM

## 2023-02-16 DIAGNOSIS — S22080A Wedge compression fracture of T11-T12 vertebra, initial encounter for closed fracture: Secondary | ICD-10-CM | POA: Insufficient documentation

## 2023-02-16 DIAGNOSIS — K219 Gastro-esophageal reflux disease without esophagitis: Secondary | ICD-10-CM | POA: Diagnosis present

## 2023-02-16 DIAGNOSIS — L02415 Cutaneous abscess of right lower limb: Secondary | ICD-10-CM | POA: Diagnosis present

## 2023-02-16 DIAGNOSIS — M25562 Pain in left knee: Secondary | ICD-10-CM | POA: Diagnosis not present

## 2023-02-16 DIAGNOSIS — L97909 Non-pressure chronic ulcer of unspecified part of unspecified lower leg with unspecified severity: Secondary | ICD-10-CM

## 2023-02-16 DIAGNOSIS — Z4789 Encounter for other orthopedic aftercare: Secondary | ICD-10-CM | POA: Diagnosis not present

## 2023-02-16 DIAGNOSIS — L03119 Cellulitis of unspecified part of limb: Principal | ICD-10-CM

## 2023-02-16 DIAGNOSIS — Z833 Family history of diabetes mellitus: Secondary | ICD-10-CM

## 2023-02-16 DIAGNOSIS — I83009 Varicose veins of unspecified lower extremity with ulcer of unspecified site: Secondary | ICD-10-CM

## 2023-02-16 DIAGNOSIS — Z7401 Bed confinement status: Secondary | ICD-10-CM | POA: Diagnosis not present

## 2023-02-16 DIAGNOSIS — I252 Old myocardial infarction: Secondary | ICD-10-CM

## 2023-02-16 DIAGNOSIS — S32030A Wedge compression fracture of third lumbar vertebra, initial encounter for closed fracture: Secondary | ICD-10-CM | POA: Insufficient documentation

## 2023-02-16 DIAGNOSIS — I25118 Atherosclerotic heart disease of native coronary artery with other forms of angina pectoris: Secondary | ICD-10-CM | POA: Diagnosis not present

## 2023-02-16 DIAGNOSIS — R457 State of emotional shock and stress, unspecified: Secondary | ICD-10-CM | POA: Diagnosis not present

## 2023-02-16 DIAGNOSIS — Z95820 Peripheral vascular angioplasty status with implants and grafts: Secondary | ICD-10-CM

## 2023-02-16 DIAGNOSIS — Z7902 Long term (current) use of antithrombotics/antiplatelets: Secondary | ICD-10-CM

## 2023-02-16 DIAGNOSIS — M519 Unspecified thoracic, thoracolumbar and lumbosacral intervertebral disc disorder: Secondary | ICD-10-CM | POA: Diagnosis not present

## 2023-02-16 DIAGNOSIS — M4854XA Collapsed vertebra, not elsewhere classified, thoracic region, initial encounter for fracture: Secondary | ICD-10-CM | POA: Diagnosis not present

## 2023-02-16 DIAGNOSIS — Z83438 Family history of other disorder of lipoprotein metabolism and other lipidemia: Secondary | ICD-10-CM

## 2023-02-16 DIAGNOSIS — Z6831 Body mass index (BMI) 31.0-31.9, adult: Secondary | ICD-10-CM

## 2023-02-16 DIAGNOSIS — I959 Hypotension, unspecified: Secondary | ICD-10-CM | POA: Diagnosis not present

## 2023-02-16 DIAGNOSIS — I255 Ischemic cardiomyopathy: Secondary | ICD-10-CM | POA: Diagnosis present

## 2023-02-16 DIAGNOSIS — R042 Hemoptysis: Secondary | ICD-10-CM | POA: Diagnosis not present

## 2023-02-16 DIAGNOSIS — R059 Cough, unspecified: Secondary | ICD-10-CM | POA: Diagnosis not present

## 2023-02-16 DIAGNOSIS — M4854XD Collapsed vertebra, not elsewhere classified, thoracic region, subsequent encounter for fracture with routine healing: Secondary | ICD-10-CM | POA: Diagnosis not present

## 2023-02-16 DIAGNOSIS — I2510 Atherosclerotic heart disease of native coronary artery without angina pectoris: Secondary | ICD-10-CM | POA: Diagnosis present

## 2023-02-16 DIAGNOSIS — R29890 Loss of height: Secondary | ICD-10-CM | POA: Diagnosis not present

## 2023-02-16 DIAGNOSIS — J439 Emphysema, unspecified: Secondary | ICD-10-CM | POA: Diagnosis present

## 2023-02-16 DIAGNOSIS — R000 Tachycardia, unspecified: Secondary | ICD-10-CM | POA: Diagnosis not present

## 2023-02-16 DIAGNOSIS — K5900 Constipation, unspecified: Secondary | ICD-10-CM | POA: Diagnosis not present

## 2023-02-16 DIAGNOSIS — M48061 Spinal stenosis, lumbar region without neurogenic claudication: Secondary | ICD-10-CM | POA: Diagnosis not present

## 2023-02-16 DIAGNOSIS — R1310 Dysphagia, unspecified: Secondary | ICD-10-CM | POA: Diagnosis present

## 2023-02-16 DIAGNOSIS — M549 Dorsalgia, unspecified: Secondary | ICD-10-CM | POA: Diagnosis not present

## 2023-02-16 DIAGNOSIS — S32050D Wedge compression fracture of fifth lumbar vertebra, subsequent encounter for fracture with routine healing: Secondary | ICD-10-CM | POA: Diagnosis not present

## 2023-02-16 DIAGNOSIS — M4856XA Collapsed vertebra, not elsewhere classified, lumbar region, initial encounter for fracture: Secondary | ICD-10-CM | POA: Diagnosis present

## 2023-02-16 DIAGNOSIS — R0602 Shortness of breath: Secondary | ICD-10-CM

## 2023-02-16 DIAGNOSIS — L97819 Non-pressure chronic ulcer of other part of right lower leg with unspecified severity: Secondary | ICD-10-CM | POA: Diagnosis not present

## 2023-02-16 DIAGNOSIS — I480 Paroxysmal atrial fibrillation: Secondary | ICD-10-CM | POA: Diagnosis not present

## 2023-02-16 DIAGNOSIS — Z8249 Family history of ischemic heart disease and other diseases of the circulatory system: Secondary | ICD-10-CM

## 2023-02-16 DIAGNOSIS — Z955 Presence of coronary angioplasty implant and graft: Secondary | ICD-10-CM

## 2023-02-16 DIAGNOSIS — Z888 Allergy status to other drugs, medicaments and biological substances status: Secondary | ICD-10-CM

## 2023-02-16 DIAGNOSIS — L97919 Non-pressure chronic ulcer of unspecified part of right lower leg with unspecified severity: Secondary | ICD-10-CM | POA: Diagnosis not present

## 2023-02-16 DIAGNOSIS — M109 Gout, unspecified: Secondary | ICD-10-CM | POA: Diagnosis present

## 2023-02-16 DIAGNOSIS — I700 Atherosclerosis of aorta: Secondary | ICD-10-CM | POA: Diagnosis not present

## 2023-02-16 DIAGNOSIS — I517 Cardiomegaly: Secondary | ICD-10-CM | POA: Diagnosis not present

## 2023-02-16 DIAGNOSIS — R918 Other nonspecific abnormal finding of lung field: Secondary | ICD-10-CM | POA: Diagnosis present

## 2023-02-16 DIAGNOSIS — S22030D Wedge compression fracture of third thoracic vertebra, subsequent encounter for fracture with routine healing: Secondary | ICD-10-CM | POA: Diagnosis not present

## 2023-02-16 DIAGNOSIS — M4855XA Collapsed vertebra, not elsewhere classified, thoracolumbar region, initial encounter for fracture: Secondary | ICD-10-CM | POA: Diagnosis not present

## 2023-02-16 DIAGNOSIS — R0902 Hypoxemia: Secondary | ICD-10-CM | POA: Diagnosis not present

## 2023-02-16 DIAGNOSIS — Z792 Long term (current) use of antibiotics: Secondary | ICD-10-CM | POA: Diagnosis not present

## 2023-02-16 DIAGNOSIS — J9611 Chronic respiratory failure with hypoxia: Secondary | ICD-10-CM | POA: Diagnosis not present

## 2023-02-16 DIAGNOSIS — Z885 Allergy status to narcotic agent status: Secondary | ICD-10-CM

## 2023-02-16 DIAGNOSIS — R451 Restlessness and agitation: Secondary | ICD-10-CM | POA: Diagnosis present

## 2023-02-16 DIAGNOSIS — I878 Other specified disorders of veins: Secondary | ICD-10-CM | POA: Diagnosis present

## 2023-02-16 DIAGNOSIS — Z9181 History of falling: Secondary | ICD-10-CM

## 2023-02-16 DIAGNOSIS — I2582 Chronic total occlusion of coronary artery: Secondary | ICD-10-CM | POA: Diagnosis present

## 2023-02-16 DIAGNOSIS — Z7901 Long term (current) use of anticoagulants: Secondary | ICD-10-CM

## 2023-02-16 DIAGNOSIS — R269 Unspecified abnormalities of gait and mobility: Secondary | ICD-10-CM | POA: Diagnosis not present

## 2023-02-16 DIAGNOSIS — E785 Hyperlipidemia, unspecified: Secondary | ICD-10-CM | POA: Diagnosis present

## 2023-02-16 DIAGNOSIS — L97811 Non-pressure chronic ulcer of other part of right lower leg limited to breakdown of skin: Secondary | ICD-10-CM | POA: Diagnosis present

## 2023-02-16 DIAGNOSIS — S22080S Wedge compression fracture of T11-T12 vertebra, sequela: Secondary | ICD-10-CM | POA: Diagnosis not present

## 2023-02-16 DIAGNOSIS — M6281 Muscle weakness (generalized): Secondary | ICD-10-CM | POA: Diagnosis not present

## 2023-02-16 DIAGNOSIS — R296 Repeated falls: Secondary | ICD-10-CM | POA: Diagnosis present

## 2023-02-16 DIAGNOSIS — I890 Lymphedema, not elsewhere classified: Secondary | ICD-10-CM | POA: Diagnosis present

## 2023-02-16 LAB — CBC WITH DIFFERENTIAL/PLATELET
Abs Immature Granulocytes: 0.11 10*3/uL — ABNORMAL HIGH (ref 0.00–0.07)
Basophils Absolute: 0 10*3/uL (ref 0.0–0.1)
Basophils Relative: 0 %
Eosinophils Absolute: 0 10*3/uL (ref 0.0–0.5)
Eosinophils Relative: 0 %
HCT: 46.5 % (ref 39.0–52.0)
Hemoglobin: 14.2 g/dL (ref 13.0–17.0)
Immature Granulocytes: 1 %
Lymphocytes Relative: 7 %
Lymphs Abs: 1 10*3/uL (ref 0.7–4.0)
MCH: 28.1 pg (ref 26.0–34.0)
MCHC: 30.5 g/dL (ref 30.0–36.0)
MCV: 91.9 fL (ref 80.0–100.0)
Monocytes Absolute: 1.3 10*3/uL — ABNORMAL HIGH (ref 0.1–1.0)
Monocytes Relative: 9 %
Neutro Abs: 11.9 10*3/uL — ABNORMAL HIGH (ref 1.7–7.7)
Neutrophils Relative %: 83 %
Platelets: 427 10*3/uL — ABNORMAL HIGH (ref 150–400)
RBC: 5.06 MIL/uL (ref 4.22–5.81)
RDW: 14.2 % (ref 11.5–15.5)
WBC: 14.4 10*3/uL — ABNORMAL HIGH (ref 4.0–10.5)
nRBC: 0 % (ref 0.0–0.2)

## 2023-02-16 LAB — COMPREHENSIVE METABOLIC PANEL
ALT: 11 U/L (ref 0–44)
AST: 22 U/L (ref 15–41)
Albumin: 3.4 g/dL — ABNORMAL LOW (ref 3.5–5.0)
Alkaline Phosphatase: 100 U/L (ref 38–126)
Anion gap: 10 (ref 5–15)
BUN: 22 mg/dL (ref 8–23)
CO2: 27 mmol/L (ref 22–32)
Calcium: 8.8 mg/dL — ABNORMAL LOW (ref 8.9–10.3)
Chloride: 98 mmol/L (ref 98–111)
Creatinine, Ser: 0.97 mg/dL (ref 0.61–1.24)
GFR, Estimated: >60 mL/min (ref >60)
Glucose, Bld: 117 mg/dL — ABNORMAL HIGH (ref 70–99)
Potassium: 4.2 mmol/L (ref 3.5–5.1)
Sodium: 135 mmol/L (ref 135–145)
Total Bilirubin: 1.1 mg/dL (ref 0.3–1.2)
Total Protein: 6.8 g/dL (ref 6.5–8.1)

## 2023-02-16 LAB — LACTIC ACID, PLASMA
Lactic Acid, Venous: 2.4 mmol/L (ref 0.5–1.9)
Lactic Acid, Venous: 1.9 mmol/L (ref 0.5–1.9)

## 2023-02-16 LAB — RESP PANEL BY RT-PCR (RSV, FLU A&B, COVID)  RVPGX2
Influenza A by PCR: NEGATIVE
Influenza B by PCR: NEGATIVE
Resp Syncytial Virus by PCR: NEGATIVE
SARS Coronavirus 2 by RT PCR: NEGATIVE

## 2023-02-16 LAB — BLOOD GAS, VENOUS
Acid-Base Excess: 6.5 mmol/L — ABNORMAL HIGH (ref 0.0–2.0)
Bicarbonate: 32.8 mmol/L — ABNORMAL HIGH (ref 20.0–28.0)
O2 Saturation: 49.4 %
Patient temperature: 37
pCO2, Ven: 53 mmHg (ref 44–60)
pH, Ven: 7.4 (ref 7.25–7.43)

## 2023-02-16 LAB — URINALYSIS, W/ REFLEX TO CULTURE (INFECTION SUSPECTED)
Bilirubin Urine: NEGATIVE
Glucose, UA: NEGATIVE mg/dL
Hgb urine dipstick: NEGATIVE
Ketones, ur: 5 mg/dL — AB
Leukocytes,Ua: NEGATIVE
Nitrite: NEGATIVE
Protein, ur: NEGATIVE mg/dL
Specific Gravity, Urine: 1.018 (ref 1.005–1.030)
pH: 6 (ref 5.0–8.0)

## 2023-02-16 LAB — APTT: aPTT: 30 seconds (ref 24–36)

## 2023-02-16 LAB — TROPONIN I (HIGH SENSITIVITY)
Troponin I (High Sensitivity): 9 ng/L (ref <18)
Troponin I (High Sensitivity): 9 ng/L (ref <18)

## 2023-02-16 LAB — PROTIME-INR
INR: 1.1 (ref 0.8–1.2)
Prothrombin Time: 14.3 seconds (ref 11.4–15.2)

## 2023-02-16 LAB — BRAIN NATRIURETIC PEPTIDE: B Natriuretic Peptide: 332 pg/mL — ABNORMAL HIGH (ref 0.0–100.0)

## 2023-02-16 LAB — D-DIMER, QUANTITATIVE: D-Dimer, Quant: 1.87 ug/mL-FEU — ABNORMAL HIGH (ref 0.00–0.50)

## 2023-02-16 MED ORDER — ENOXAPARIN SODIUM 60 MG/0.6ML IJ SOSY
0.5000 mg/kg | PREFILLED_SYRINGE | INTRAMUSCULAR | Status: DC
  Administered 2023-02-16 – 2023-02-25 (×10): 50 mg via SUBCUTANEOUS
  Filled 2023-02-16 (×10): qty 0.6

## 2023-02-16 MED ORDER — VANCOMYCIN HCL IN DEXTROSE 1-5 GM/200ML-% IV SOLN
1000.0000 mg | Freq: Two times a day (BID) | INTRAVENOUS | Status: DC
  Administered 2023-02-16 – 2023-02-18 (×4): 1000 mg via INTRAVENOUS
  Filled 2023-02-16 (×4): qty 200

## 2023-02-16 MED ORDER — METOPROLOL TARTRATE 5 MG/5ML IV SOLN: 5.0000 mg | Freq: Four times a day (QID) | INTRAVENOUS | Status: DC | PRN

## 2023-02-16 MED ORDER — HYDROXYZINE HCL 25 MG PO TABS
25.0000 mg | ORAL_TABLET | Freq: Three times a day (TID) | ORAL | Status: DC | PRN
  Administered 2023-02-17 – 2023-03-01 (×4): 25 mg via ORAL
  Filled 2023-02-16 (×4): qty 1

## 2023-02-16 MED ORDER — SODIUM CHLORIDE 0.9 % IV SOLN
1.0000 g | INTRAVENOUS | Status: DC
  Administered 2023-02-16 – 2023-02-19 (×4): 1 g via INTRAVENOUS
  Filled 2023-02-16 (×6): qty 10

## 2023-02-16 MED ORDER — METOPROLOL TARTRATE 50 MG PO TABS
100.0000 mg | ORAL_TABLET | Freq: Two times a day (BID) | ORAL | Status: DC
  Administered 2023-02-16 – 2023-02-25 (×19): 100 mg via ORAL
  Filled 2023-02-16 (×21): qty 2

## 2023-02-16 MED ORDER — TORSEMIDE 20 MG PO TABS
40.0000 mg | ORAL_TABLET | Freq: Two times a day (BID) | ORAL | Status: DC
  Administered 2023-02-16 – 2023-02-24 (×16): 40 mg via ORAL
  Filled 2023-02-16 (×16): qty 2

## 2023-02-16 MED ORDER — ACETAMINOPHEN 325 MG PO TABS
650.0000 mg | ORAL_TABLET | Freq: Four times a day (QID) | ORAL | Status: DC | PRN
  Administered 2023-02-18 – 2023-03-02 (×10): 650 mg via ORAL
  Filled 2023-02-16 (×10): qty 2

## 2023-02-16 MED ORDER — METHYLPREDNISOLONE SODIUM SUCC 125 MG IJ SOLR
80.0000 mg | INTRAMUSCULAR | Status: AC
  Administered 2023-02-16 – 2023-02-17 (×2): 80 mg via INTRAVENOUS
  Filled 2023-02-16 (×2): qty 2

## 2023-02-16 MED ORDER — DILTIAZEM HCL 30 MG PO TABS
60.0000 mg | ORAL_TABLET | Freq: Three times a day (TID) | ORAL | Status: DC
  Administered 2023-02-16 – 2023-02-20 (×12): 60 mg via ORAL
  Filled 2023-02-16 (×4): qty 2
  Filled 2023-02-16: qty 1
  Filled 2023-02-16 (×7): qty 2

## 2023-02-16 MED ORDER — METHYLPREDNISOLONE SODIUM SUCC 40 MG IJ SOLR: 40.0000 mg | Freq: Two times a day (BID) | INTRAMUSCULAR | Status: DC

## 2023-02-16 MED ORDER — VANCOMYCIN HCL IN DEXTROSE 1-5 GM/200ML-% IV SOLN
1000.0000 mg | Freq: Once | INTRAVENOUS | Status: AC
  Administered 2023-02-16: 1000 mg via INTRAVENOUS
  Filled 2023-02-16: qty 200

## 2023-02-16 MED ORDER — IPRATROPIUM-ALBUTEROL 0.5-2.5 (3) MG/3ML IN SOLN
3.0000 mL | Freq: Four times a day (QID) | RESPIRATORY_TRACT | Status: DC
  Administered 2023-02-16 – 2023-02-17 (×6): 3 mL via RESPIRATORY_TRACT
  Filled 2023-02-16 (×6): qty 3

## 2023-02-16 MED ORDER — CLOPIDOGREL BISULFATE 75 MG PO TABS
75.0000 mg | ORAL_TABLET | Freq: Every day | ORAL | Status: DC
  Administered 2023-02-16 – 2023-02-22 (×7): 75 mg via ORAL
  Filled 2023-02-16 (×7): qty 1

## 2023-02-16 MED ORDER — GABAPENTIN 100 MG PO CAPS
100.0000 mg | ORAL_CAPSULE | Freq: Three times a day (TID) | ORAL | Status: DC
  Administered 2023-02-16 – 2023-03-04 (×46): 100 mg via ORAL
  Filled 2023-02-16 (×47): qty 1

## 2023-02-16 MED ORDER — SODIUM CHLORIDE 0.9 % IV SOLN
2.0000 g | Freq: Once | INTRAVENOUS | Status: AC
  Administered 2023-02-16: 2 g via INTRAVENOUS
  Filled 2023-02-16: qty 12.5

## 2023-02-16 MED ORDER — ALBUTEROL SULFATE (2.5 MG/3ML) 0.083% IN NEBU
3.0000 mL | INHALATION_SOLUTION | Freq: Four times a day (QID) | RESPIRATORY_TRACT | Status: DC | PRN
  Administered 2023-02-18 – 2023-02-25 (×3): 3 mL via RESPIRATORY_TRACT
  Filled 2023-02-16 (×5): qty 3

## 2023-02-16 MED ORDER — POLYETHYLENE GLYCOL 3350 17 G PO PACK
17.0000 g | PACK | Freq: Every day | ORAL | Status: DC
  Administered 2023-02-17 – 2023-02-26 (×9): 17 g via ORAL
  Filled 2023-02-16 (×10): qty 1

## 2023-02-16 MED ORDER — ENOXAPARIN SODIUM 40 MG/0.4ML IJ SOSY: 40.0000 mg | PREFILLED_SYRINGE | INTRAMUSCULAR | Status: DC

## 2023-02-16 MED ORDER — UMECLIDINIUM BROMIDE 62.5 MCG/ACT IN AEPB
1.0000 | INHALATION_SPRAY | Freq: Every day | RESPIRATORY_TRACT | Status: DC
  Administered 2023-02-17 – 2023-03-04 (×15): 1 via RESPIRATORY_TRACT
  Filled 2023-02-16 (×4): qty 7

## 2023-02-16 MED ORDER — FLUTICASONE FUROATE-VILANTEROL 100-25 MCG/ACT IN AEPB
1.0000 | INHALATION_SPRAY | Freq: Every day | RESPIRATORY_TRACT | Status: DC
  Administered 2023-02-17 – 2023-03-04 (×15): 1 via RESPIRATORY_TRACT
  Filled 2023-02-16 (×3): qty 28

## 2023-02-16 MED ORDER — ASPIRIN 81 MG PO TBEC
81.0000 mg | DELAYED_RELEASE_TABLET | Freq: Every day | ORAL | Status: DC
  Administered 2023-02-17 – 2023-02-22 (×6): 81 mg via ORAL
  Filled 2023-02-16 (×6): qty 1

## 2023-02-16 MED ORDER — LACTATED RINGERS IV BOLUS
1000.0000 mL | Freq: Once | INTRAVENOUS | Status: AC
  Administered 2023-02-16: 1000 mL via INTRAVENOUS

## 2023-02-16 NOTE — Progress Notes (Signed)
PHARMACIST - PHYSICIAN COMMUNICATION   CONCERNING: Methylprednisolone IV    Current order: Methylprednisolone IV 40mg  every 12 hours     DESCRIPTION: Per [facility] Protocol:   IV methylprednisolone will be converted to either a q12h or q24h frequency with the same total daily dose (TDD).  Ordered Dose: 1 to 125 mg TDD; convert to: TDD q24h.  Ordered Dose: 126 to 250 mg TDD; convert to: TDD div q12h.  Ordered Dose: >250 mg TDD; DAW.  Order has been adjusted to: Methylprednisolone IV 80mg  every 24 hours   Gardner Candle , PharmD, BCPS Clinical Pharmacist  02/16/2023 1:15 PM

## 2023-02-16 NOTE — Consult Note (Signed)
Hospital Consult    Reason for Consult:  Bilateral Lower extremity Cellulitis  Requesting Physician:  Dr Marrion Coy MD MRN #:  [id number]  History of Present Illness: This is a 67 y.o. male past medical history significant for peripheral arterial disease, CAD, CHF, atrial fibrillation, COPD, who presents to the emergency department with shortness of breath. Patient initially called out for shortness of breath, when EMS arrived patient was very agitated, received IV Versed and much more calm afterwards and agreeable to coming to the emergency department. Patient endorses shortness of breath and not feeling well. Significant swelling to his lower legs. Has not been following at wound care for the past 3 weeks secondary to feeling short of breath and that he cannot get there. Therefore he has left an [employer] on his right lower extremity for 1 month. This was removed today in the ER. Noted open wounds very malodorous. Pictures taken and placed in the media file on the chart. States that he did not come to the emergency department because it cost too much money. States that he has been compliant with his home medications. Denies any falls or trauma.   Past Medical History:  Diagnosis Date   Arthritis    Asthma    Atrial fibrillation (HCC)    CHF (congestive heart failure) (HCC)    COPD (chronic obstructive pulmonary disease) (HCC)    Coronary artery disease    GERD (gastroesophageal reflux disease)    Hypertension    Myocardial infarction [facility])    Shortness of breath dyspnea     Past Surgical History:  Procedure Laterality Date   ANGIOPLASTY  2009   CARDIAC CATHETERIZATION N/A 03/18/2015   Procedure: Left Heart Cath with Coronary Angiography;  Surgeon: Lamar Blinks, MD;  Location: [facility] INVASIVE CV LAB;  Service: Cardiovascular;  Laterality: N/A;   CHOLECYSTECTOMY  2005   CORONARY ANGIOGRAM  2009   LOWER EXTREMITY ANGIOGRAPHY Right 08/31/2022   Procedure: Lower Extremity Angiography;   Surgeon: Renford Dills, MD;  Location: [facility] INVASIVE CV LAB;  Service: Cardiovascular;  Laterality: Right;    Allergies  Allergen Reactions   Benadryl [Diphenhydramine Hcl] Shortness Of Breath   Morphine And Codeine Swelling    Difficulty breathing per patient    Oxycodone Swelling    Severe mouth swelling requiring medical intervention    Prior to Admission medications   Medication Sig Start Date End Date Taking? Authorizing Provider  acetaminophen (TYLENOL) 325 MG tablet Take 2 tablets (650 mg total) by mouth every 6 (six) hours as needed for mild pain (or Fever >/= 101). 09/07/22  Yes Hollice Espy, MD  albuterol (VENTOLIN HFA) 108 (90 Base) MCG/ACT inhaler Inhale 1 puff into the lungs every 6 (six) hours as needed for wheezing or shortness of breath. 02/17/22  Yes Lyndon Code, MD  aspirin EC 81 MG tablet Take 1 tablet (81 mg total) by mouth daily. Swallow whole. 09/08/22  Yes Hollice Espy, MD  clopidogrel (PLAVIX) 75 MG tablet Take 1 tablet (75 mg total) by mouth daily. 12/02/22  Yes Georgiana Spinner, NP  leptospermum manuka honey (MEDIHONEY) PSTE paste Apply 1 Application topically daily. 07/20/22  Yes Darlin Priestly, MD  metoprolol tartrate (LOPRESSOR) 100 MG tablet Take 1 tablet (100 mg total) by mouth 2 (two) times daily. 09/07/22  Yes Hollice Espy, MD  oxymetazoline (AFRIN) 0.05 % nasal spray Place 1 spray into both nostrils 2 (two) times daily. 09/07/22  Yes Hollice Espy, MD  polyethylene glycol (MIRALAX / GLYCOLAX) 17 g packet Take 17 g by mouth daily. 09/08/22  Yes Hollice Espy, MD  torsemide (DEMADEX) 20 MG tablet Take 2 tablets (40 mg total) by mouth 2 (two) times daily for 3 days, THEN 2 tablets (40 mg total) daily. 09/08/22 02/16/23 Yes Hollice Espy, MD  TRELEGY ELLIPTA 100-62.5-25 MCG/ACT AEPB INHALE 1 PUFF BY MOUTH INTO LUNGS DAILY 05/25/22  Yes Abernathy, Alyssa, NP  gabapentin (NEURONTIN) 100 MG capsule Take 100 mg by mouth 3 (three) times  daily. Patient not taking: Reported on 02/16/2023 10/15/22   [provider]  hydrOXYzine (ATARAX) 25 MG tablet Take 1 tablet (25 mg total) by mouth 3 (three) times daily as needed for anxiety. Patient not taking: Reported on 02/16/2023 07/19/22   Darlin Priestly, MD  ipratropium-albuterol (DUONEB) 0.5-2.5 (3) MG/3ML SOLN USE 1 VIAL IN NEBULIZER EVERY 6 HOURS Patient not taking: Reported on 02/16/2023 01/27/23   Sallyanne Kuster, NP  traMADol (ULTRAM) 50 MG tablet Take 50 mg by mouth every 6 (six) hours as needed. Patient not taking: Reported on 02/16/2023 10/12/22   [provider]    Social History   Socioeconomic History   Marital status: Married    Spouse name: Not on file   Number of children: Not on file   Years of education: Not on file   Highest education level: Not on file  Occupational History   Not on file  Tobacco Use   Smoking status: Former    Current packs/day: 0.00    Types: Cigarettes    Start date: 03/20/1972    Quit date: 03/20/1997    Years since quitting: 25.9   Smokeless tobacco: Never  Vaping Use   Vaping status: Never Used  Substance and Sexual Activity   Alcohol use: No   Drug use: No   Sexual activity: Not on file  Other Topics Concern   Not on file  Social History Narrative   Not on file   Social Determinants of Health   Financial Resource Strain: Low Risk  (11/04/2020)   Overall Financial Resource Strain (CARDIA)    Difficulty of Paying Living Expenses: Not hard at all  Food Insecurity: No Food Insecurity (02/16/2023)   Hunger Vital Sign    Worried About Running Out of Food in the Last Year: Never true    Ran Out of Food in the Last Year: Never true  Transportation Needs: No Transportation Needs (02/16/2023)   PRAPARE - Administrator, Civil Service (Medical): No    Lack of Transportation (Non-Medical): No  Physical Activity: Not on file  Stress: Not on file  Social Connections: Not on file  Intimate Partner Violence: Not At  Risk (02/16/2023)   Humiliation, Afraid, Rape, and Kick questionnaire    Fear of Current or Ex-Partner: No    Emotionally Abused: No    Physically Abused: No    Sexually Abused: No     Family History  Problem Relation Age of Onset   Coronary artery disease Father    Diabetes Father    Hyperlipidemia Father    Hypertension Father    Stroke Mother    Hyperlipidemia Mother     ROS: Otherwise negative unless mentioned in HPI  Physical Examination  Vitals:   02/16/23 1300 02/16/23 1330  BP: (!) 159/103 (!) 154/97  Pulse: (!) 117 (!) 58  Resp: (!) 26 (!) 22  Temp:    SpO2: 93% 96%   Body mass index is  31.85 kg/m.  General:  WDWN in NAD Gait: Not observed HENT: WNL, normocephalic Pulmonary: Labored breathing, with Rales, rhonchi,  and wheezing. Sats on 10 liters of [place] Oxygen is 98% Cardiac: irregular, Hx of Atrial fibrillation, without  Murmurs, rubs or gallops; without carotid bruits Abdomen: Positive bowel sounds, soft, NT/ND, no masses Skin: without rashes Vascular Exam/Pulses: Strong Doppler pulses at Dorsalis Pedis and Post tibial bilaterally.  Extremities: without ischemic changes, without Gangrene , with cellulitis; with open wounds;  Musculoskeletal: no muscle wasting or atrophy  Neurologic: A&O X 3;  No focal weakness or paresthesias are detected; speech is fluent/normal Psychiatric:  The pt has Normal affect. Lymph:  Unremarkable  CBC    Component Value Date/Time   WBC 14.4 (H) 02/16/2023 0943   RBC 5.06 02/16/2023 0943   HGB 14.2 02/16/2023 0943   HGB 15.1 02/19/2020 0000   HCT 46.5 02/16/2023 0943   HCT 45.6 02/19/2020 0000   PLT 427 (H) 02/16/2023 0943   PLT 345 02/19/2020 0000   MCV 91.9 02/16/2023 0943   MCV 90 02/19/2020 0000   MCV 91 07/19/2013 0417   MCH 28.1 02/16/2023 0943   MCHC 30.5 02/16/2023 0943   RDW 14.2 02/16/2023 0943   RDW 12.9 02/19/2020 0000   RDW 14.2 07/19/2013 0417   LYMPHSABS 1.0 02/16/2023 0943   LYMPHSABS 2.2 02/19/2020  0000   LYMPHSABS 2.1 07/19/2013 0417   MONOABS 1.3 (H) 02/16/2023 0943   MONOABS 1.5 (H) 07/19/2013 0417   EOSABS 0.0 02/16/2023 0943   EOSABS 0.2 02/19/2020 0000   EOSABS 0.1 07/19/2013 0417   BASOSABS 0.0 02/16/2023 0943   BASOSABS 0.0 02/19/2020 0000   BASOSABS 0.0 07/19/2013 0417    BMET    Component Value Date/Time   NA 135 02/16/2023 0943   NA 138 02/19/2020 0000   NA 135 (L) 07/19/2013 0417   K 4.2 02/16/2023 0943   K 3.9 07/19/2013 0417   CL 98 02/16/2023 0943   CL 101 07/19/2013 0417   CO2 27 02/16/2023 0943   CO2 29 07/19/2013 0417   GLUCOSE 117 (H) 02/16/2023 0943   GLUCOSE 86 07/19/2013 0417   BUN 22 02/16/2023 0943   BUN 14 02/19/2020 0000   BUN 10 07/19/2013 0417   CREATININE 0.97 02/16/2023 0943   CREATININE 0.80 07/19/2013 0417   CALCIUM 8.8 (L) 02/16/2023 0943   CALCIUM 8.4 (L) 07/19/2013 0417   GFRNONAA >60 02/16/2023 0943   GFRNONAA >60 07/19/2013 0417   GFRAA 105 02/19/2020 0000   GFRAA >60 07/19/2013 0417    COAGS: Lab Results  Component Value Date   INR 1.1 02/16/2023   INR 1.0 09/25/2022   INR 1.2 08/27/2022     Non-Invasive Vascular Imaging:   EXAM: PORTABLE CHEST 1 VIEW   COMPARISON:  Chest radiograph 09/25/2022   FINDINGS: Mild cardiomegaly is unchanged. The upper mediastinal contours are stable.   Coarsened interstitial markings particularly in the lung bases are again seen, unchanged. Lucency in the upper lungs is unchanged consistent with underlying emphysema. There is no new focal airspace opacity. There is no pulmonary edema. There is no pleural effusion or pneumothorax.   There is no acute osseous abnormality.   IMPRESSION: 1. Unchanged cardiomegaly, coarsened interstitial markings, and emphysema. 2. No convincing acute airspace opacity.  Statin:  No. Beta Blocker:  Yes.   Aspirin:  Yes.   ACEI:  No. ARB:  No. CCB use:  No Other antiplatelets/anticoagulants:  Yes.   Plavix 75 mg Daily  ASSESSMENT/PLAN:  This is a 67 y.o. male who presents to [facility] ER with severe shortness of breath. Upon arrival patient noted to have an [employer] on his right lower extremity for wound care. He endorses he has been unable to get to the wound care clinic to get it changed for a little over a month now. Patient noted to have bilateral lower extremity edema with cellulitis. Doppler exam shows strong DP and PT pulses bilaterally.   PLAN: Patient has strong doppler pulses to bilateral lower extremities today. Bilateral cellulitis and right lower extremity non healing ulcers are not from ischemia but infection. He would benefit from wound care consult. Therefore no vascular intervention is needed. Patient needs antibiotics for cellulitis and work up for his shortness of breath.    -I discussed the plan in detail with Dr Festus Barren MD and he agrees with the plan.    Marcie Bal [facility] 02/16/2023 1:56 PM

## 2023-02-16 NOTE — Plan of Care (Signed)
  Problem: Respiratory: Goal: Ability to maintain adequate ventilation will improve Outcome: Progressing   Problem: Education: Goal: Knowledge of General Education information will improve Description: Including pain rating scale, medication(s)/side effects and non-pharmacologic comfort measures Outcome: Progressing   Problem: Clinical Measurements: Goal: Ability to maintain clinical measurements within normal limits will improve Outcome: Progressing   Problem: Activity: Goal: Risk for activity intolerance will decrease Outcome: Progressing   

## 2023-02-16 NOTE — Consult Note (Signed)
Pharmacy Antibiotic Note  Erik [name] is a 67 y.o. male with medical history including Afib not on anticoagulation, systolic CHF, pulmonary hypertension, COPD, chronic hypoxemia on 4L oxygen, CAD, HTN admitted on 02/16/2023 with  right leg wounds .  Pharmacy has been consulted for vancomycin dosing. Patient is also ordered ceftriaxone.  Plan:  Vancomycin 2 g IV LD followed by maintenance regimen of vancomycin 1 g IV q12h --Calculated AUC: 412.5, Cmin: 12 --IBW, Vd 0.72 (BMI 32), Scr 0.97 --Daily Scr per protocol --Levels at steady state or as clinically indicated  Height: 5\' 10"  (177.8 cm) Weight: 100.7 kg (222 lb) IBW/kg (Calculated) : 73  Temp (24hrs), Avg:98.5 F (36.9 C), Min:98.5 F (36.9 C), Max:98.5 F (36.9 C)  Recent Labs  Lab 02/16/23 0943 02/16/23 0944  WBC 14.4*  --   CREATININE 0.97  --   LATICACIDVEN  --  2.4*    Estimated Creatinine Clearance: 86.7 mL/min (by C-G formula based on SCr of 0.97 mg/dL).    Allergies  Allergen Reactions   Benadryl [Diphenhydramine Hcl] Shortness Of Breath   Morphine And Codeine Swelling    Difficulty breathing per patient    Oxycodone Swelling    Severe mouth swelling requiring medical intervention    Antimicrobials this admission: Cefepime 7/11 x 1 Vancomycin 7/11 >>  Ceftriaxone 7/11 >>   Dose adjustments this admission: N/A  Microbiology results: 7/11 BCx: pending 7/11 WCx: pending  Thank you for allowing pharmacy to be a part of this patient's care.  Tressie Ellis 02/16/2023 12:56 PM

## 2023-02-16 NOTE — Plan of Care (Signed)
  Problem: Respiratory: Goal: Ability to maintain adequate ventilation will improve Outcome: Progressing   Problem: Pain Managment: Goal: General experience of comfort will improve Outcome: Progressing   Problem: Skin Integrity: Goal: Risk for impaired skin integrity will decrease Outcome: Progressing

## 2023-02-16 NOTE — Progress Notes (Signed)
CODE SEPSIS - PHARMACY COMMUNICATION  **Broad Spectrum Antibiotics should be administered within 1 hour of Sepsis diagnosis**  Time Code Sepsis Called/Page Received: 1610  Antibiotics Ordered: Cefepime + vancomycin  Time of 1st antibiotic administration: 0955  Additional action taken by pharmacy: N/A  Erik [name] 02/16/2023  9:42 AM

## 2023-02-16 NOTE — ED Notes (Signed)
C/O back pain.  Repositioned in stretcher.  Balnket to cover feet, patient c/o feet being cold.

## 2023-02-16 NOTE — Consult Note (Signed)
PHARMACY -  BRIEF ANTIBIOTIC NOTE   Pharmacy has received consult(s) for vancomycin and cefepime from an ED provider.  The patient's profile has been reviewed for ht/wt/allergies/indication/available labs.    One time order(s) placed for  --Vancomycin 1 g IV --Cefepime 2 g IV  Further antibiotics/pharmacy consults should be ordered by admitting physician if indicated.                       Thank you, Tressie Ellis 02/16/2023  9:41 AM

## 2023-02-16 NOTE — ED Triage Notes (Signed)
Pt arrives via [facility] from home after a call out for an assist from the FD. Pt was on their way to the wound care clinic for bilateral wounds on lower extremities. Pt was supposed to see wound care 2-3x/wk but hasn't seen them in over 3 weeks after insurance covering the in home visits because the MD approval expired. Pt states they also have trouble breathing when walking from room to room. Pt has a hx of Afib and emphysema.

## 2023-02-16 NOTE — H&P (Signed)
History and Physical    Patient: Erik [name]:[id number] DOB: 1956-07-25 DOA: 02/16/2023 DOS: the patient was seen and examined on 02/16/2023 PCP: Pcp, No  Patient coming from: Home  Chief Complaint:  Chief Complaint  Patient presents with   sepsis work up   HPI: Erik [name] is a 67 y.o. male with medical history significant of paroxysmal atrial fibrillation not on anticoagulation, chronic systolic congestive heart failure with ejection fraction 35 to 40%, moderate pulm hypertension, COPD, chronic hypoxemia on 4 L oxygen, coronary disease, essential hypertension, who present to the hospital with complaints of worsening wounds in the right leg.  Patient has a chronic lymphedema with a right leg wound, recently had a right leg angiogram and angioplasty 2 months ago.  He has been trying to get to the wound clinic without success, over the last month, patient had increased draining from a right lower extremity with a strong odor.  Over the last few days, patient has not feeling well, he was nauseous, feels chills, and significant fatigue.  Patient also had increased short of breath for the last 3 days, wheezing.  Minimal cough.  Upon arrival to the emergency room, patient had a tachycardia of 139, tachypnea 34, blood pressure stable, leukocytosis of 14.4, patient was placed on vancomycin for wound infection with cellulitis.   Review of Systems: As mentioned in the history of present illness. All other systems reviewed and are negative. Past Medical History:  Diagnosis Date   Arthritis    Asthma    Atrial fibrillation (HCC)    CHF (congestive heart failure) (HCC)    COPD (chronic obstructive pulmonary disease) (HCC)    Coronary artery disease    GERD (gastroesophageal reflux disease)    Hypertension    Myocardial infarction [facility])    Shortness of breath dyspnea    Past Surgical History:  Procedure Laterality Date   ANGIOPLASTY  2009   CARDIAC CATHETERIZATION N/A 03/18/2015    Procedure: Left Heart Cath with Coronary Angiography;  Surgeon: Lamar Blinks, MD;  Location: [facility] INVASIVE CV LAB;  Service: Cardiovascular;  Laterality: N/A;   CHOLECYSTECTOMY  2005   CORONARY ANGIOGRAM  2009   LOWER EXTREMITY ANGIOGRAPHY Right 08/31/2022   Procedure: Lower Extremity Angiography;  Surgeon: Renford Dills, MD;  Location: [facility] INVASIVE CV LAB;  Service: Cardiovascular;  Laterality: Right;   Social History:  reports that he quit smoking about 25 years ago. His smoking use included cigarettes. He started smoking about 50 years ago. He has never used smokeless tobacco. He reports that he does not drink alcohol and does not use drugs.  Allergies  Allergen Reactions   Benadryl [Diphenhydramine Hcl] Shortness Of Breath   Morphine And Codeine Swelling    Difficulty breathing per patient    Oxycodone Swelling    Severe mouth swelling requiring medical intervention    Family History  Problem Relation Age of Onset   Coronary artery disease Father    Diabetes Father    Hyperlipidemia Father    Hypertension Father    Stroke Mother    Hyperlipidemia Mother     Prior to Admission medications   Medication Sig Start Date End Date Taking? Authorizing Provider  acetaminophen (TYLENOL) 325 MG tablet Take 2 tablets (650 mg total) by mouth every 6 (six) hours as needed for mild pain (or Fever >/= 101). 09/07/22   Hollice Espy, MD  albuterol (VENTOLIN HFA) 108 (90 Base) MCG/ACT inhaler Inhale 1 puff into the lungs every  6 (six) hours as needed for wheezing or shortness of breath. 02/17/22   Lyndon Code, MD  aspirin EC 81 MG tablet Take 1 tablet (81 mg total) by mouth daily. Swallow whole. 09/08/22   Hollice Espy, MD  clopidogrel (PLAVIX) 75 MG tablet Take 1 tablet (75 mg total) by mouth daily. 12/02/22   Georgiana Spinner, NP  gabapentin (NEURONTIN) 100 MG capsule Take 100 mg by mouth 3 (three) times daily. 10/15/22   [provider]  hydrOXYzine (ATARAX) 25 MG tablet  Take 1 tablet (25 mg total) by mouth 3 (three) times daily as needed for anxiety. 07/19/22   Darlin Priestly, MD  ipratropium-albuterol (DUONEB) 0.5-2.5 (3) MG/3ML SOLN USE 1 VIAL IN NEBULIZER EVERY 6 HOURS 01/27/23   Sallyanne Kuster, NP  leptospermum manuka honey (MEDIHONEY) PSTE paste Apply 1 Application topically daily. 07/20/22   Darlin Priestly, MD  metoprolol tartrate (LOPRESSOR) 100 MG tablet Take 1 tablet (100 mg total) by mouth 2 (two) times daily. 09/07/22   Hollice Espy, MD  oxymetazoline (AFRIN) 0.05 % nasal spray Place 1 spray into both nostrils 2 (two) times daily. 09/07/22   Hollice Espy, MD  polyethylene glycol (MIRALAX / GLYCOLAX) 17 g packet Take 17 g by mouth daily. 09/08/22   Hollice Espy, MD  torsemide (DEMADEX) 20 MG tablet Take 2 tablets (40 mg total) by mouth 2 (two) times daily for 3 days, THEN 2 tablets (40 mg total) daily. 09/08/22 10/11/22  Hollice Espy, MD  TRELEGY ELLIPTA 100-62.5-25 MCG/ACT AEPB INHALE 1 PUFF BY MOUTH INTO LUNGS DAILY 05/25/22   Sallyanne Kuster, NP    Physical Exam: Vitals:   02/16/23 1030 02/16/23 1100 02/16/23 1130 02/16/23 1153  BP: (!) 152/84 (!) 156/99 (!) 147/106   Pulse: (!) 112 (!) 119 (!) 122 (!) 120  Resp: (!) 25 (!) 30 (!) 25 (!) 25  Temp:      TempSrc:      SpO2: 96% 95% 97% 96%  Weight:      Height:       Physical Exam Constitutional:      General: He is in acute distress.     Appearance: He is obese. He is not toxic-appearing.  HENT:     Head: Normocephalic and atraumatic.     Nose: No congestion or rhinorrhea.     Mouth/Throat:     Mouth: Mucous membranes are moist.     Pharynx: Oropharynx is clear.  Eyes:     Conjunctiva/sclera: Conjunctivae normal.     Pupils: Pupils are equal, round, and reactive to light.  Cardiovascular:     Rate and Rhythm: Tachycardia present. Rhythm irregular.     Heart sounds: No murmur heard. Pulmonary:     Breath sounds: Wheezing present.     Comments: Tachypnea with decreased  breath sounds. Abdominal:     General: Bowel sounds are normal. There is no distension.     Palpations: Abdomen is soft.     Tenderness: There is no abdominal tenderness.  Musculoskeletal:        General: Normal range of motion.     Cervical back: Normal range of motion and neck supple. No rigidity or tenderness.     Comments: Bilateral leg mild edema, red in color which she was chronic, right lower extremity shallow wounds with significant secretion.  Lymphadenopathy:     Cervical: No cervical adenopathy.  Skin:    General: Skin is warm and dry.  Coloration: Skin is not jaundiced.  Neurological:     General: No focal deficit present.     Mental Status: He is alert and oriented to person, place, and time.     Cranial Nerves: No cranial nerve deficit.  Psychiatric:        Mood and Affect: Mood normal.        Behavior: Behavior normal.     Data Reviewed:  Chest x-ray has no pulm edema.  Lab results reviewed.  Assessment and Plan: Sepsis secondary to right leg cellulitis. Right lower extremity cellulitis with wound infection. Peripheral arterial disease. Patient met sepsis criteria with tachycardia, tachypnea, leukocytosis.  Lactic acid level mildly elevated.  The source is right leg infection.  Will start antibiotics with Rocephin and vancomycin. Obtain consult from orthopedics as well as vascular surgery.  COPD exacerbation. Chronic hypoxemic respiratory failure. Patient has some bronchospasm, will start IV steroids.  Continue bronchodilator.  Continue oxygen treatment.  Paroxysmal atrial fibrillation with RVR. Patient had a significant tachycardia appears to be secondary to sepsis.  I will continue metoprolol, also added diltiazem. Patient was not chronically on anticoagulation.    Chronic systolic congestive heart failure.   Moderate pulmonary hypertension. Patient does not have exacerbation of congestive heart failure.  BNP only mildly elevated.  Will restart home  dose beta-blocker, restart torsemide at 40 mg twice a day.  Essential hypertension. Coronary artery disease. Continue home medicines.   Advance Care Planning:   Code Status: Full Code discussed with the patient, patient is a full code.  Consults: Vascular surgery and orthopedics.  Family Communication: Wife at bedside, updated.  Severity of Illness: The appropriate patient status for this patient is INPATIENT. Inpatient status is judged to be reasonable and necessary in order to provide the required intensity of service to ensure the patient's safety. The patient's presenting symptoms, physical exam findings, and initial radiographic and laboratory data in the context of their chronic comorbidities is felt to place them at high risk for further clinical deterioration. Furthermore, it is not anticipated that the patient will be medically stable for discharge from the hospital within 2 midnights of admission.   * I certify that at the point of admission it is my clinical judgment that the patient will require inpatient hospital care spanning beyond 2 midnights from the point of admission due to high intensity of service, high risk for further deterioration and high frequency of surveillance required.*  Author: Marrion Coy, MD 02/16/2023 12:58 PM  For on call review [email or web address].

## 2023-02-16 NOTE — ED Provider Notes (Addendum)
[facility] Provider Note    Event Date/Time   First MD Initiated Contact with Patient 02/16/23 [phone]     (approximate)   History   sepsis work up   HPI  [facility] is a 67 y.o. male past medical history significant for peripheral arterial disease, CAD, CHF, atrial fibrillation, COPD, who presents to the emergency department with shortness of breath.  Patient initially called out for shortness of breath, when EMS arrived patient was very agitated, received IV Versed and much more calm afterwards and agreeable to coming to the emergency department.  Patient Dors is shortness of breath and not feeling well.  Significant swelling to his lower legs.  Has not been following at wound care for the past 3 weeks secondary to feeling short of breath and that he cannot get there.  States that he did not come to the emergency department because it cost too much money.  States that he has been compliant with his home medications.  Denies any falls or trauma.  With EMS patient was tachycardic, tachypneic.  Had obvious swelling to his lower legs.     Physical Exam   Triage Vital Signs: ED Triage Vitals  Encounter Vitals Group     BP 02/16/23 0939 (!) 129/92     Systolic BP Percentile --      Diastolic BP Percentile --      Pulse Rate 02/16/23 0939 (!) 135     Resp 02/16/23 0939 (!) 27     Temp 02/16/23 0939 98.5 F (36.9 C)     Temp Source 02/16/23 0939 Oral     SpO2 02/16/23 0938 94 %     Weight 02/16/23 0942 222 lb (100.7 kg)     Height 02/16/23 0942 5\' 10"  (1.778 m)     Head Circumference --      Peak Flow --      Pain Score 02/16/23 0940 5     Pain Loc --      Pain Education --      Exclude from Growth Chart --     Most recent vital signs: Vitals:   02/16/23 0941 02/16/23 1000  BP:  (!) 142/107  Pulse:  (!) 139  Resp:  (!) 22  Temp:    SpO2: 97% 91%    Physical Exam Constitutional:      Appearance: He is well-developed. He is obese. He is  ill-appearing.  HENT:     Head: Atraumatic.  Eyes:     Conjunctiva/sclera: Conjunctivae normal.     Pupils: Pupils are equal, round, and reactive to light.  Cardiovascular:     Rate and Rhythm: Tachycardia present. Rhythm irregular.  Pulmonary:     Effort: Respiratory distress present.     Breath sounds: Rhonchi and rales present.     Comments: 96% on 4 L nasal cannula Abdominal:     General: There is distension.     Tenderness: There is no abdominal tenderness.  Musculoskeletal:     Cervical back: Normal range of motion.     Right lower leg: Edema present.     Left lower leg: Edema present.     Comments: +2 pitting edema to bilateral lower extremities.  Bilateral erythema, warmth and induration.  No crepitus.  Venous wound to the left calf muscle that does not probe to bone.  Venous wound to the right lower leg with purulent drainage.  Skin:    General: Skin is warm.  Capillary Refill: Capillary refill takes 2 to 3 seconds.  Neurological:     Mental Status: He is alert. Mental status is at baseline.     IMPRESSION / MDM / ASSESSMENT AND PLAN / ED COURSE  I reviewed the triage vital signs and the nursing notes.  Differential diagnosis including chronic venous stasis wounds, cellulitis, CHF exacerbation, atrial fibrillation with a rapid rate, ACS, pneumonia, osteomyelitis  Felt that 30 cc/kg of IV fluids may be detrimental to the patient given her shortness of breath, patient was given 500 cc of IV fluids with EMS, will reevaluate after fluids have finished, they were given over 30 minutes.  EKG  I, Corena Herter, the attending physician, personally viewed and interpreted this ECG. Atrial fibrillation with a rapid rate of 127.  Significant underlying artifact.  Nonspecific ST changes.  Normal QTc.  QRS is narrow.  Atrial fibrillation with rapid rate while on cardiac telemetry.  RADIOLOGY I independently reviewed imaging, my interpretation of imaging: Chest x-ray  -cardiomegaly.  No focal findings consistent with pneumonia.  No significant pulmonary edema.  Read as unchanged cardiomegaly.  Signs of acute airspace opacity.  LABS (all labs ordered are listed, but only abnormal results are displayed) Labs interpreted as -    Labs Reviewed  LACTIC ACID, PLASMA - Abnormal; Notable for the following components:      Result Value   Lactic Acid, Venous 2.4 (*)    All other components within normal limits  COMPREHENSIVE METABOLIC PANEL - Abnormal; Notable for the following components:   Glucose, Bld 117 (*)    Calcium 8.8 (*)    Albumin 3.4 (*)    All other components within normal limits  CBC WITH DIFFERENTIAL/PLATELET - Abnormal; Notable for the following components:   WBC 14.4 (*)    Platelets 427 (*)    Neutro Abs 11.9 (*)    Monocytes Absolute 1.3 (*)    Abs Immature Granulocytes 0.11 (*)    All other components within normal limits  BLOOD GAS, VENOUS - Abnormal; Notable for the following components:   Bicarbonate 32.8 (*)    Acid-Base Excess 6.5 (*)    All other components within normal limits  RESP PANEL BY RT-PCR (RSV, FLU A&B, COVID)  RVPGX2  CULTURE, BLOOD (ROUTINE X 2)  CULTURE, BLOOD (ROUTINE X 2)  PROTIME-INR  APTT  LACTIC ACID, PLASMA  BRAIN NATRIURETIC PEPTIDE  URINALYSIS, W/ REFLEX TO CULTURE (INFECTION SUSPECTED)  TROPONIN I (HIGH SENSITIVITY)     MDM  Blood cultures obtained.  Given obvious concern for cellulitis with tachycardia and tachypnea meets criteria for sepsis.  Started on IV vancomycin and cefepime given concern for purulent cellulitis from chronic venous stasis wounds.   Leukocytosis of 14.4.  Normal troponin.  BNP currently pending.  No significant hypercarbia.  Creatinine at baseline.  No significant electrolyte abnormalities.  Lactic acid elevated at 2.4.  On reevaluation no significant pulmonary edema.  Will give another 1 L of IV fluids given ongoing atrial fibrillation with a rapid rate and  tachycardia.  Consulted hospitalist for admission for sepsis and lower extremity wounds/cellulitis  After discussion with the hospitalist, some concern for possible pulmonary embolism, adding on a D-dimer and they will follow it.     PROCEDURES:  Critical Care performed: yes  .Critical Care  Performed by: Corena Herter, MD Authorized by: Corena Herter, MD   Critical care provider statement:    Critical care time (minutes):  30   Critical care time was exclusive of:  Separately billable procedures and treating other patients   Critical care was necessary to treat or prevent imminent or life-threatening deterioration of the following conditions:  Sepsis   Critical care was time spent personally by me on the following activities:  Development of treatment plan with patient or surrogate, discussions with consultants, evaluation of patient's response to treatment, examination of patient, ordering and review of laboratory studies, ordering and review of radiographic studies, ordering and performing treatments and interventions, pulse oximetry, re-evaluation of patient's condition and review of old charts   Patient's presentation is most consistent with acute presentation with potential threat to life or bodily function.   MEDICATIONS ORDERED IN ED: Medications  vancomycin (VANCOCIN) IVPB 1000 mg/200 mL premix (1,000 mg Intravenous New Bag/Given 02/16/23 1037)  lactated ringers bolus 1,000 mL (has no administration in time range)  ceFEPIme (MAXIPIME) 2 g in sodium chloride 0.9 % 100 mL IVPB (0 g Intravenous Stopped 02/16/23 1030)    FINAL CLINICAL IMPRESSION(S) / ED DIAGNOSES   Final diagnoses:  Cellulitis of lower extremity, unspecified laterality  Venous ulcer (HCC)     Rx / DC Orders   ED Discharge Orders     None        Note:  This document was prepared using Dragon voice recognition software and may include unintentional dictation errors.   Corena Herter,  MD 02/16/23 1104    Corena Herter, MD 02/16/23 1123

## 2023-02-16 NOTE — Sepsis Progress Note (Signed)
Code Sepsis protocol being monitored by eLink. 

## 2023-02-17 ENCOUNTER — Inpatient Hospital Stay: Payer: HMO

## 2023-02-17 DIAGNOSIS — J9611 Chronic respiratory failure with hypoxia: Secondary | ICD-10-CM | POA: Diagnosis not present

## 2023-02-17 DIAGNOSIS — L02415 Cutaneous abscess of right lower limb: Secondary | ICD-10-CM

## 2023-02-17 DIAGNOSIS — J441 Chronic obstructive pulmonary disease with (acute) exacerbation: Secondary | ICD-10-CM | POA: Diagnosis not present

## 2023-02-17 DIAGNOSIS — L03115 Cellulitis of right lower limb: Secondary | ICD-10-CM

## 2023-02-17 DIAGNOSIS — R918 Other nonspecific abnormal finding of lung field: Secondary | ICD-10-CM | POA: Insufficient documentation

## 2023-02-17 DIAGNOSIS — S22080A Wedge compression fracture of T11-T12 vertebra, initial encounter for closed fracture: Secondary | ICD-10-CM | POA: Insufficient documentation

## 2023-02-17 LAB — MAGNESIUM: Magnesium: 2.2 mg/dL (ref 1.7–2.4)

## 2023-02-17 LAB — PROCALCITONIN: Procalcitonin: <0.1 ng/mL

## 2023-02-17 LAB — BASIC METABOLIC PANEL
Anion gap: 9 (ref 5–15)
BUN: 23 mg/dL (ref 8–23)
CO2: 28 mmol/L (ref 22–32)
Calcium: 8.8 mg/dL — ABNORMAL LOW (ref 8.9–10.3)
Chloride: 99 mmol/L (ref 98–111)
Creatinine, Ser: 0.95 mg/dL (ref 0.61–1.24)
GFR, Estimated: >60 mL/min (ref >60)
Glucose, Bld: 205 mg/dL — ABNORMAL HIGH (ref 70–99)
Potassium: 4.6 mmol/L (ref 3.5–5.1)
Sodium: 136 mmol/L (ref 135–145)

## 2023-02-17 LAB — CBC
HCT: 41.2 % (ref 39.0–52.0)
Hemoglobin: 13.3 g/dL (ref 13.0–17.0)
MCH: 29 pg (ref 26.0–34.0)
MCHC: 32.3 g/dL (ref 30.0–36.0)
MCV: 90 fL (ref 80.0–100.0)
Platelets: 417 10*3/uL — ABNORMAL HIGH (ref 150–400)
RBC: 4.58 MIL/uL (ref 4.22–5.81)
RDW: 14.4 % (ref 11.5–15.5)
WBC: 10.1 10*3/uL (ref 4.0–10.5)
nRBC: 0 % (ref 0.0–0.2)

## 2023-02-17 LAB — PROTIME-INR
INR: 1 (ref 0.8–1.2)
Prothrombin Time: 13.7 seconds (ref 11.4–15.2)

## 2023-02-17 LAB — CORTISOL-AM, BLOOD: Cortisol - AM: 5.8 ug/dL — ABNORMAL LOW (ref 6.7–22.6)

## 2023-02-17 LAB — HIV ANTIBODY (ROUTINE TESTING W REFLEX): HIV Screen 4th Generation wRfx: NONREACTIVE

## 2023-02-17 MED ORDER — IPRATROPIUM-ALBUTEROL 0.5-2.5 (3) MG/3ML IN SOLN
3.0000 mL | Freq: Three times a day (TID) | RESPIRATORY_TRACT | Status: DC
  Administered 2023-02-18 – 2023-03-04 (×41): 3 mL via RESPIRATORY_TRACT
  Filled 2023-02-17 (×41): qty 3

## 2023-02-17 MED ORDER — PREDNISONE 20 MG PO TABS
40.0000 mg | ORAL_TABLET | Freq: Every day | ORAL | Status: DC
  Administered 2023-02-18 – 2023-02-20 (×3): 40 mg via ORAL
  Filled 2023-02-17 (×3): qty 2

## 2023-02-17 MED ORDER — IOHEXOL 350 MG/ML SOLN
75.0000 mL | Freq: Once | INTRAVENOUS | Status: AC | PRN
  Administered 2023-02-17: 75 mL via INTRAVENOUS

## 2023-02-17 NOTE — Consult Note (Addendum)
PODIATRY CONSULTATION  NAME HUAN IMBERT MRN [id number] DOB [birth year] DOA 02/16/2023   Reason for consult: Ulcer RLE Chief Complaint  Patient presents with   sepsis work up   History of present illness: 67 y.o. male PMHx paroxysmal A-fib not on anticoagulation, CHF, COPD, admitted for worsening wounds to the right leg.  Currently on Rocephin 1 g every 24 hours.  Patient actually had an appointment yesterday with [facility] wound care center.  In the past he has been treated by his PCP for management of the ulcers.  Podiatry consulted for recommendations on wound dressings for the ulcers  Past Medical History:  Diagnosis Date   Arthritis    Asthma    Atrial fibrillation (HCC)    CHF (congestive heart failure) (HCC)    COPD (chronic obstructive pulmonary disease) (HCC)    Coronary artery disease    GERD (gastroesophageal reflux disease)    Hypertension    Myocardial infarction (HCC)    Shortness of breath dyspnea        Latest Ref Rng & Units 02/17/2023    4:39 AM 02/16/2023    9:43 AM 09/25/2022   10:21 AM  CBC  WBC 4.0 - 10.5 K/uL 10.1  14.4  13.1   Hemoglobin 13.0 - 17.0 g/dL 40.9  81.1  91.4   Hematocrit 39.0 - 52.0 % 41.2  46.5  47.6   Platelets 150 - 400 K/uL 417  427  426        Latest Ref Rng & Units 02/17/2023    4:39 AM 02/16/2023    9:43 AM 09/25/2022   10:21 AM  BMP  Glucose 70 - 99 mg/dL 782  956  213   BUN 8 - 23 mg/dL 23  22  17    Creatinine 0.61 - 1.24 mg/dL 0.86  5.78  4.69   Sodium 135 - 145 mmol/L 136  135  134   Potassium 3.5 - 5.1 mmol/L 4.6  4.2  3.4   Chloride 98 - 111 mmol/L 99  98  90   CO2 22 - 32 mmol/L 28  27  35   Calcium 8.9 - 10.3 mg/dL 8.8  8.8  9.1       RT leg 02/16/2023   Physical Exam: General: The patient is alert and oriented x3 in no acute distress.   Dermatology: Full-thickness ulcers noted RLE.  Please see above noted photo.  Granular wound base with mixed fibrotic tissue.  They appear stable  Vascular: VAS [place] lower  extremity arterial duplex RLE 12/02/2022.  Dr. Levora Dredge VVS LOWER EXTREMITY ARTERIAL DUPLEX STUDY  Patient Name:  SRIYAN TERP  Date of Exam:   12/02/2022 Medical Rec #: [id number]       Accession #:    [id number] Date of Birth: May 14, 1956      Patient Gender: M Patient Age:   29 years Exam Location:  [facility] Procedure:      VAS [place] LOWER EXTREMITY ARTERIAL DUPLEX Referring Phys: Levora Dredge  Indications: Ulceration, and peripheral artery disease.  High Risk Factors: Hypertension, hyperlipidemia, past history of smoking.    Vascular Interventions: 08/31/2022 PTA and stent right common iliac artery, SFA, popliteal artery. Current ABI:            Right: 1.07, Left: 1.27  Performing Technologist: Hardie Lora RVT Examination Guidelines: A complete evaluation includes B-mode imaging, Spectral Doppler, color Doppler, and power Doppler as needed of all accessible Portions of each vessel. Bilateral testing  is considered an integral part of a Complete examination. Limited examinations for reoccurring indications may be Performed as noted. +----------+--------+-----+--------+--------+--------+ RIGHT     PSV cm/sRatioStenosisWaveformComments +----------+--------+-----+--------+--------+--------+ CFA Distal102                  biphasic         +----------+--------+-----+--------+--------+--------+ DFA       35                   biphasic         +----------+--------+-----+--------+--------+--------+ SFA Prox  107                  biphasic         +----------+--------+-----+--------+--------+--------+ SFA Mid   83                   biphasic         +----------+--------+-----+--------+--------+--------+ SFA Distal81                   biphasic         +----------+--------+-----+--------+--------+--------+ POP Prox  80                   biphasic         +----------+--------+-----+--------+--------+--------+ POP  Distal54                   biphasic         +----------+--------+-----+--------+--------+--------+ Right Stent(s): +---------------+--------+--------+--------+--------+ SFA / Pop +    PSV cm/sStenosisWaveformComments +---------------+--------+--------+--------+--------+ Prox to Stent  103             biphasic         +---------------+--------+--------+--------+--------+ Proximal Stent 122             biphasic         +---------------+--------+--------+--------+--------+ Mid Stent      55              biphasic         +---------------+--------+--------+--------+--------+ Distal Stent   80              biphasic         +---------------+--------+--------+--------+--------+ Distal to Stent111             biphasic         +---------------+--------+--------+--------+--------+ Summary: Right: Patent stent with no evidence of stenosis in the superficial femoral artery and popliteal artery artery. Unable to scan calf secondary to bandages    Neurological: Diminished via light touch Musculoskeletal Exam: No structural deformity noted.  ASSESSMENT/PLAN OF CARE Chronic ulcers RLE in the presence of PVD  -Patient seen at bedside this evening with his spouse present.-No plans for surgical intervention from a podiatry standpoint - Wound care orders have been placed.  Continue as ordered -Recommend follow-up at the [facility] wound care center postdischarge.  He had an appointment yesterday, 02/16/2023 but was admitted. -Continue ABX as ordered. -Podiatry will sign off  Please contact me directly via secure chat with any questions or concerns.     Felecia Shelling, DPM [facility]  Dr. Felecia Shelling, DPM    [address], [place] [address]  Office [phone]  Fax [phone]

## 2023-02-17 NOTE — Hospital Course (Addendum)
Erik [name] is a 67 y.o. male with medical history significant of paroxysmal atrial fibrillation not on anticoagulation, chronic systolic congestive heart failure with ejection fraction 35 to 40%, moderate pulm hypertension, COPD, chronic hypoxemia on 4 L oxygen, coronary disease, essential hypertension, who present to the hospital with complaints of worsening wounds in the right leg.  Patient is treated with vancomycin and Rocephin for cellulitis and wound infection. Patient was also treated with IV steroids for COPD exacerbation.  CT chest ruled out PE. Condition had improved, antibiotic changed to oral.  Steroids dose tapering down.  Patient also has compressive fracture in T11 and L3, does not appear to be acute.  Neurosurgery consult obtained due to persistent pain.  7/17.  Patient having a lot of pain still and having difficulty breathing secondary to the pain.  MRI of the thoracic and lumbar spine showing T11, L3 and L5 compression fractures. 7/18.  Held aspirin and Plavix for potential kyphoplasty's on Tuesday. 7/19.  Patient still with back pain and difficulty moving around.  Feels his breathing has been better since yesterday when he coughed up some phlegm. 7/20.  Patient was coughing this morning coughed up a little bit of blood.  Family wanted me to test for COVID and that was negative.  Seen this morning while trying to stand and getting into the chair needing quite a bit of support with nursing staff. 7/21.  Noticed some blackish brownish discoloration under his first toe on the right foot and on his foot below the fifth/fourth toe.  Will start heparin drip to thin the blood and while off his aspirin and Plavix.  Spoke with vascular surgery. 7/22.  Patient complaining of left knee pain today.  Difficulty moving his knee.  He does not think he has a history of gout.  Breathing is okay.  No coughing up blood.  No pain in his feet. 7/23.  Left knee pain is better with steroid yesterday and  today.  Patient went into atrial fibrillation postprocedure requiring IV meds.  Asked nursing staff to give his Toprol.

## 2023-02-17 NOTE — Consult Note (Addendum)
WOC Nurse Consult Note: Reason for Consult:Patient with full thickness wound to RLE. Seen by Vascular Surgery NP-C and indication is for topical wound care for wound infection. Previous indication for compression. Photo provided by EDP. Wound type:full thickness wound with infection in the presence of venous insufficiency Pressure Injury POA: N/A Measurement:To be obtained by Bedside RN with next dressing change today and documented on Nursing Flow Sheet Wound VWU:JWJXB red, moist Drainage (amount, consistency, odor) serous, strong odor. No wound care for 1 month per EDP. Periwound: dry, intact, erythematous Dressing procedure/placement/frequency:I have provided nursing with topical care orders for this wound using a daily cleanse with Vashe pure hypochlorous acid followed by placement of a Vashe moistened gauze over the wound. This is to be covered with dry gauze, an ABD pad and secured with a dry boot consisting of a Kerlix roll gauze applied from just below toes to just below knee. This is to be covered with a 6-inch ACE bandage applied in a similar manner. Changes are to be daily,   Recommend patient be referred to the outpatient wound care center for wound care or, since there are documented obstacles to patient getting to the [facility], a [facility] referral could be considered for once-weekly wound care. Periodic follow up in the Vascular office clinic as an outpatient is recommended.  WOC nursing team will not follow, but will remain available to this patient, the nursing and medical teams.  Please re-consult if needed.  Thank you for inviting [place] to participate in this patient's Plan of Care.  Ladona Mow, MSN, RN, CNS, GNP, Leda Min, [employer], [employer] phone:  [phone]

## 2023-02-17 NOTE — TOC Progression Note (Signed)
Transition of Care (TOC) - Progression Note    Patient Details  Name: Erik [name] MRN: [id number] Date of Birth: July 26, 1956  Transition of Care [facility]) CM/SW Contact  Marlowe Sax, RN Phone Number: 02/17/2023, 9:51 AM  Clinical Narrative:    Patient from home with his wife He stated that he has not left his house other than going to the hospital for nearly 3 years, he stated that he has severe panic attacks and that affects him leaving his house, he stated that he has transportation but his anxiety prevents him from going anywhere, he has oxygen at home and is uon 4 liters, [employer] services his oxygen needs He has a RW that someone gave him and a cane, he requested a rollator, [employer] to deliver to the bedside prior to DC [facility] was open with the patient and last visit was in June, I reached out to [name] with [facility] to see if they can see the patient once a week for wound care, Awaiting a response  He stated that he has not been keeping his doctors appointments due to panic attacks and therefore has not been managing his wounds I encouraged him to reach out to his Health insurance company to see who in [place] that does house calls example doctors making house calls, he stated that he would check into that  TOC to continue to follow the patient for needs and assist with DC planning.   Expected Discharge Plan: Home w Home Health Services Barriers to Discharge: No Barriers Identified  Expected Discharge Plan and Services   Discharge Planning Services: CM Consult   Living arrangements for the past 2 months: Single Family Home                 DME Arranged: Walker rolling with seat DME Agency: [employer] Date DME Agency Contacted: 02/17/23 Time DME Agency Contacted: [phone] Representative spoke with at DME Agency: [facility] HH Arranged: RN, PT HH Agency: [facility] Date [facility] Agency Contacted: 02/17/23 Time HH Agency Contacted: [phone] Representative spoke with at [facility]  Agency: [name]   Social Determinants of Health (SDOH) Interventions SDOH Screenings   Food Insecurity: No Food Insecurity (02/16/2023)  Housing: Low Risk  (02/16/2023)  Transportation Needs: No Transportation Needs (02/16/2023)  Utilities: Not At Risk (02/16/2023)  Alcohol Screen: Low Risk  (02/19/2020)  Depression (PHQ2-9): Low Risk  (08/25/2020)  Financial Resource Strain: Low Risk  (11/04/2020)  Tobacco Use: Medium Risk (02/16/2023)    Readmission Risk Interventions     No data to display

## 2023-02-17 NOTE — Evaluation (Signed)
Occupational Therapy Evaluation Patient Details Name: Erik [name] MRN: [id number] DOB: Jun 29, 1956 Today's Date: 02/17/2023   History of Present Illness This is a 67 y.o. male past medical history significant for peripheral arterial disease, CAD, CHF, atrial fibrillation, COPD. Admitted due to SOB, cellulitis of LE, and a venous ulcer.   Clinical Impression   Pt was seen for OT evaluation this date. Pt presents to acute OT demonstrating impaired ADL performance and functional mobility 2/2 decreased strength, cardiopulmonary status, activity tolerance, and balance (See OT problem list for additional functional deficits). Pt currently requires MOD A for ADL transfers, MOD A for LB ADL tasks, and MAX VC for PLB. Pt endorses feelings of anxiety and panic with he becomes SOB. Pt/spouse educated in PLB and activity pacing, focusing on rest breaks to minimize SOB in order to prevent the panic/anxiety pt then experiences when he becomes SOB. Pt/spouse verbalized understanding. Wife notes that some of this information has been shared by providers in the past but that the pt has a difficult time implementing. Pt would benefit from skilled OT services to address noted impairments and functional limitations (see below for any additional details) in order to maximize safety and independence while minimizing falls risk and caregiver burden.    Recommendations for follow up therapy are one component of a multi-disciplinary discharge planning process, led by the attending physician.  Recommendations may be updated based on patient status, additional functional criteria and insurance authorization.   Assistance Recommended at Discharge Intermittent Supervision/Assistance  Patient can return home with the following A lot of help with walking and/or transfers;A lot of help with bathing/dressing/bathroom;Assistance with cooking/housework;Assist for transportation;Help with stairs or ramp for entrance;Direct  supervision/assist for medications management    Functional Status Assessment  Patient has had a recent decline in their functional status and demonstrates the ability to make significant improvements in function in a reasonable and predictable amount of time.  Equipment Recommendations  Other (comment) (defer to next venue)    Recommendations for Other Services       Precautions / Restrictions Precautions Precautions: Fall Restrictions Weight Bearing Restrictions: No      Mobility Bed Mobility               General bed mobility comments: NT in recliner    Transfers                   General transfer comment: Pt mildly SOB, anxious, deferred      Balance                                           ADL either performed or assessed with clinical judgement   ADL                                         General ADL Comments: Pt currently requires MOD A for LB ADL tasks. Anticipate MOD A for ADL transfers     Vision         Perception     Praxis      Pertinent Vitals/Pain       Hand Dominance Right   Extremity/Trunk Assessment Upper Extremity Assessment Upper Extremity Assessment: Overall WFL for tasks assessed   Lower Extremity Assessment Lower Extremity Assessment: Generalized weakness  Communication Communication Communication: No difficulties   Cognition Arousal/Alertness: Awake/alert Behavior During Therapy: Anxious Overall Cognitive Status: Within Functional Limits for tasks assessed                                       General Comments       Exercises Other Exercises Other Exercises: Pt/spouse educated in PLB and activity pacing, focusing on rest breaks to minimize SOB in order to prevent the panic/anxiety pt then experiences when he becomes SOB.   Shoulder Instructions      Home Living Family/patient expects to be discharged to:: Private residence Living  Arrangements: Spouse/significant other Available Help at Discharge: Family;Available 24 hours/day Type of Home: House Home Access: Stairs to enter [employer] of Steps: wife clarified.... one entrance has 3 steps to porch and then another 3 steps all with rails; another entrance has 6 steps Entrance Stairs-Rails: Right;Left Home Layout: One level     Bathroom Shower/Tub: Sponge bathes at baseline   Bathroom Toilet: Handicapped height     Home Equipment: Agricultural consultant (2 wheels);Cane - quad   Additional Comments: Home O2 3L      Prior Functioning/Environment Prior Level of Function : Needs assist       Physical Assist : ADLs (physical)   ADLs (physical): Dressing;Bathing;IADLs Mobility Comments: Reliant on RW or Cane. States he has not left home since his prior admission due to anxiety. ADLs Comments: IND in toileting, takes sponge baths.  Patient's wife assists with lower body dressing. Family handles IADLs,        OT Problem List: Decreased strength;Cardiopulmonary status limiting activity;Decreased activity tolerance;Impaired balance (sitting and/or standing);Decreased knowledge of use of DME or AE      OT Treatment/Interventions: Self-care/ADL training;Therapeutic exercise;Therapeutic activities;Energy conservation;DME and/or AE instruction;Patient/family education;Balance training    OT Goals(Current goals can be found in the care plan section) Acute Rehab OT Goals Patient Stated Goal: breathe better OT Goal Formulation: With patient/family Time For Goal Achievement: 03/03/23 Potential to Achieve Goals: Good ADL Goals Pt Will Transfer to Toilet: (P) with modified independence;ambulating (LRAD) Pt Will Perform Toileting - Clothing Manipulation and hygiene: (P) with modified independence Additional ADL Goal #1: (P) Pt will utilize PLB throughout exertional activity with PRN VC in order to minimize SOB. Additional ADL Goal #2: (P) pt will verbalize plan to  implement at least 2 learned ECS into daily ADL/IADL routine.  OT Frequency: Min 2X/week    Co-evaluation              AM-PAC OT "6 Clicks" Daily Activity     Outcome Measure Help from another person eating meals?: None Help from another person taking care of personal grooming?: A Little Help from another person toileting, which includes using toliet, bedpan, or urinal?: A Lot Help from another person bathing (including washing, rinsing, drying)?: A Lot Help from another person to put on and taking off regular upper body clothing?: A Little Help from another person to put on and taking off regular lower body clothing?: A Lot 6 Click Score: 16   End of Session Equipment Utilized During Treatment: Oxygen Nurse Communication: Mobility status  Activity Tolerance: Other (comment) (anxious) Patient left: in chair;with call bell/phone within reach;with chair alarm set;with family/visitor present  OT Visit Diagnosis: Other abnormalities of gait and mobility (R26.89);Muscle weakness (generalized) (M62.81)  Time: 7829-5621 OT Time Calculation (min): 25 min Charges:  OT General Charges $OT Visit: 1 Visit OT Evaluation $OT Eval Moderate Complexity: 1 Mod OT Treatments $Self Care/Home Management : 8-22 mins  Arman Filter., MPH, MS, OTR/L ascom [phone] 02/17/23, 5:06 PM

## 2023-02-17 NOTE — Progress Notes (Signed)
Progress Note   Patient: Erik [name] GUY:[id number] DOB: 1955/10/24 DOA: 02/16/2023     1 DOS: the patient was seen and examined on 02/17/2023   Brief hospital course: LINSEY LOMBARDI is a 67 y.o. male with medical history significant of paroxysmal atrial fibrillation not on anticoagulation, chronic systolic congestive heart failure with ejection fraction 35 to 40%, moderate pulm hypertension, COPD, chronic hypoxemia on 4 L oxygen, coronary disease, essential hypertension, who present to the hospital with complaints of worsening wounds in the right leg.  Patient is treated with vancomycin and Rocephin for cellulitis and wound infection. Patient was also treated with IV steroids for COPD exacerbation.   Principal Problem:   Cellulitis and abscess of right leg Active Problems:   Venous ulcer (HCC)   COPD exacerbation (HCC)   PAD (peripheral artery disease) (HCC)   Paroxysmal atrial fibrillation with RVR (HCC)   Chronic systolic CHF (congestive heart failure) (HCC)   Obesity (BMI 30-39.9)   Chronic respiratory failure with hypoxia (HCC)   Sepsis (HCC)   Moderate pulmonary hypertension (HCC)   Compression fracture of T11 vertebra (HCC)   Multiple lung nodules   Assessment and Plan: Sepsis secondary to right leg cellulitis. Right lower extremity cellulitis with wound infection. Peripheral arterial disease. Patient met sepsis criteria with tachycardia, tachypnea, leukocytosis.  Lactic acid level mildly elevated.  The source is right leg infection.   started antibiotics with Rocephin and vancomycin. Patient was seen by vascular surgery, no need for additional intervention.  Will be followed by podiatry. Patient condition seem to be improving today, continue current antibiotics.  Also followed by wound care.   COPD exacerbation. Chronic hypoxemic respiratory failure. Patient has some bronchospasm, will start IV steroids.  Continue bronchodilator.  Continue oxygen treatment. Patient has  significant elevation of D-dimer, CT angiogram ruled out PE.  Lung nodules. Inflammatory versus true nodules.  Need to follow-up with repeat imaging in 3 months.   Paroxysmal atrial fibrillation with RVR. Patient had a significant tachycardia appears to be secondary to sepsis.  I will continue metoprolol, also added diltiazem. Patient was not chronically on anticoagulation.   Heart rate is better controlled today.   Chronic systolic congestive heart failure.   Moderate pulmonary hypertension. Patient does not have exacerbation of congestive heart failure.  BNP only mildly elevated.  Will restart home dose beta-blocker, restart torsemide at 40 mg twice a day.   Essential hypertension. Coronary artery disease. Continue home medicines.  T11 compression fracture. Likely old fracture, no recent fall.  Will follow.     Subjective:  Patient still complaining of short of breath with duration.  On home dose oxygen.  Cough, nonproductive.  Physical Exam: Vitals:   02/16/23 2331 02/17/23 0200 02/17/23 0537 02/17/23 0846  BP: 129/89  105/73 103/72  Pulse: 83  (!) 57 91  Resp: 18   18  Temp: 98.1 F (36.7 C)   (!) 97.2 F (36.2 C)  TempSrc:      SpO2: 100% 96% 97% 91%  Weight:      Height:       General exam: Appears calm and comfortable  Respiratory system: Clear to auscultation. Respiratory effort normal. Cardiovascular system: S1 & S2 heard, RRR. No JVD, murmurs, rubs, gallops or clicks. No pedal edema. Gastrointestinal system: Abdomen is nondistended, soft and nontender. No organomegaly or masses felt. Normal bowel sounds heard. Central nervous system: Alert and oriented. No focal neurological deficits. Extremities: Leg redness much better, right leg wound. Skin: No rashes,  lesions or ulcers Psychiatry: Judgement and insight appear normal. Mood & affect appropriate.    Data Reviewed:  CT angiogram did not show PE, lab results reviewed.  Family Communication:  None  Disposition: Status is: Inpatient Remains inpatient appropriate because: Severity of disease, IV treatment.     Time spent: 35 minutes  Author: Marrion Coy, MD 02/17/2023 10:56 AM  For on call review [email or web address].

## 2023-02-17 NOTE — Evaluation (Signed)
Physical Therapy Evaluation Patient Details Name: Erik [name] MRN: [id number] DOB: Jun 22, 1956 Today's Date: 02/17/2023  History of Present Illness  This is a 67 y.o. male past medical history significant for peripheral arterial disease, CAD, CHF, atrial fibrillation, COPD. Admitted due to SOB, cellulitis of LE, and a venous ulcer.  Clinical Impression  Pt is a 67 year old male who was admitted for SOB, LLE venous ulcer and LLE cellulitis. Pt performs bed mobility mod +2 with use of trapeze, and transfers mod assist +2. Patient is very anxious surrounding motion due to pain in his mid back. Encouragement and physical assistance required for initiation of movement. Patient uses a RW at baseline but requested a rollator upon admission. A rollator is the only AD in his room and is currently unsafe with his level of function. Pt demonstrates deficits with strength, pain, ROM, mobility, endurance, safe DME use and cardiopulmonary status.Pt will continue to receive skilled PT services while admitted and will defer to TOC/care team for updates regarding disposition planning.         Assistance Recommended at Discharge Intermittent Supervision/Assistance  If plan is discharge home, recommend the following:  Can travel by private vehicle  A lot of help with walking and/or transfers;A lot of help with bathing/dressing/bathroom;Assistance with cooking/housework;Assist for transportation;Help with stairs or ramp for entrance   No    Equipment Recommendations None recommended by PT  Recommendations for Other Services  OT consult    Functional Status Assessment Patient has had a recent decline in their functional status and/or demonstrates limited ability to make significant improvements in function in a reasonable and predictable amount of time     Precautions / Restrictions Precautions Precautions: Fall Restrictions Weight Bearing Restrictions: No      Mobility  Bed Mobility Overal bed  mobility: Needs Assistance Bed Mobility: Supine to Sit     Supine to sit: Mod assist, +2 for physical assistance     General bed mobility comments: Assist with verbal cueing for sequencing and encouragement. Increased time needed due to anxiety around movement. Assisted in sliding legs across bed.    Transfers Overall transfer level: Needs assistance Equipment used: Rollator (4 wheels) Transfers: Sit to/from Stand, Bed to chair/wheelchair/BSC Sit to Stand: Mod assist, From elevated surface   Step pivot transfers: Mod assist       General transfer comment: Patient increasingling anxious about movement due to mid back pain. SPT blocking rollator when feet to prevent movement.    Ambulation/Gait                  Stairs            Wheelchair Mobility     Tilt Bed    Modified Rankin (Stroke Patients Only)       Balance Overall balance assessment: Needs assistance Sitting-balance support: Bilateral upper extremity supported, Feet supported Sitting balance-Leahy Scale: Fair     Standing balance support: Bilateral upper extremity supported, During functional activity, Reliant on assistive device for balance Standing balance-Leahy Scale: Poor                               Pertinent Vitals/Pain Pain Assessment Pain Assessment: 0-10 Pain Score: 10-Worst pain ever Pain Location: Mid Back Pain Descriptors / Indicators: Crying, Stabbing, Spasm, Shooting, Sharp Pain Intervention(s): Limited activity within patient's tolerance, Monitored during session, Repositioned    Home Living Family/patient expects to be discharged to:: Private residence  Living Arrangements: Spouse/significant other Available Help at Discharge: Family;Available 24 hours/day Type of Home: House Home Access: Stairs to enter Entrance Stairs-Rails: Doctor, general practice of Steps: 9   Home Layout: One level Home Equipment: Agricultural consultant (2 wheels);Cane -  quad Additional Comments: Home O2 3L    Prior Function Prior Level of Function : Needs assist       Physical Assist : ADLs (physical)   ADLs (physical): Dressing;Bathing;IADLs Mobility Comments: Reliant on RW or Cane. States he has not left home since his prior admission due to anxiety. ADLs Comments: IND in toileting, takes sponge baths.  Patient's wife assists with lower body dressing. Family handles IADLs,     Hand Dominance   Dominant Hand: Right    Extremity/Trunk Assessment   Upper Extremity Assessment Upper Extremity Assessment: Overall WFL for tasks assessed    Lower Extremity Assessment Lower Extremity Assessment: Generalized weakness       Communication   Communication: No difficulties  Cognition Arousal/Alertness: Awake/alert Behavior During Therapy: Anxious, Restless Overall Cognitive Status: Within Functional Limits for tasks assessed                                          General Comments      Exercises     Assessment/Plan    PT Assessment Patient needs continued PT services  PT Problem List Decreased strength;Decreased range of motion;Decreased activity tolerance;Decreased balance;Decreased mobility;Decreased knowledge of use of DME;Cardiopulmonary status limiting activity;Pain;Obesity       PT Treatment Interventions DME instruction;Gait training;Stair training;Functional mobility training;Therapeutic activities;Therapeutic exercise;Balance training;Cognitive remediation;Patient/family education    PT Goals (Current goals can be found in the Care Plan section)  Acute Rehab PT Goals Patient Stated Goal: to get better PT Goal Formulation: With patient Time For Goal Achievement: 03/03/23 Potential to Achieve Goals: Poor    Frequency Min 1X/week     Co-evaluation               AM-PAC PT "6 Clicks" Mobility  Outcome Measure Help needed turning from your back to your side while in a flat bed without using bedrails?:  Total Help needed moving from lying on your back to sitting on the side of a flat bed without using bedrails?: Total Help needed moving to and from a bed to a chair (including a wheelchair)?: A Lot Help needed standing up from a chair using your arms (e.g., wheelchair or bedside chair)?: A Lot Help needed to walk in hospital room?: A Lot Help needed climbing 3-5 steps with a railing? : Total 6 Click Score: 9    End of Session Equipment Utilized During Treatment: Oxygen Activity Tolerance: Patient limited by pain Patient left: in chair;with call bell/phone within reach;with chair alarm set Nurse Communication: Mobility status PT Visit Diagnosis: Unsteadiness on feet (R26.81);Muscle weakness (generalized) (M62.81);Pain;History of falling (Z91.81);Difficulty in walking, not elsewhere classified (R26.2) Pain - Right/Left:  (midline) Pain - part of body:  (back)    Time: 1610-9604 PT Time Calculation (min) (ACUTE ONLY): 24 min   Charges:                 Malachi Carl, SPT   Malachi Carl 02/17/2023, 2:58 PM

## 2023-02-17 NOTE — Plan of Care (Signed)
  Problem: Fluid Volume: Goal: Hemodynamic stability will improve Outcome: Progressing   Problem: Clinical Measurements: Goal: Diagnostic test results will improve Outcome: Progressing Goal: Signs and symptoms of infection will decrease Outcome: Progressing   Problem: Respiratory: Goal: Ability to maintain adequate ventilation will improve 02/17/2023 2010 by Deirdre Pippins, RN Outcome: Progressing 02/17/2023 2009 by Deirdre Pippins, RN Outcome: Progressing   Problem: Education: Goal: Knowledge of General Education information will improve Description: Including pain rating scale, medication(s)/side effects and non-pharmacologic comfort measures 02/17/2023 2010 by Deirdre Pippins, RN Outcome: Progressing 02/17/2023 2009 by Deirdre Pippins, RN Outcome: Progressing   Problem: Health Behavior/Discharge Planning: Goal: Ability to manage health-related needs will improve Outcome: Progressing   Problem: Clinical Measurements: Goal: Ability to maintain clinical measurements within normal limits will improve Outcome: Progressing Goal: Will remain free from infection Outcome: Progressing Goal: Diagnostic test results will improve Outcome: Progressing Goal: Respiratory complications will improve Outcome: Progressing Goal: Cardiovascular complication will be avoided Outcome: Progressing   Problem: Activity: Goal: Risk for activity intolerance will decrease 02/17/2023 2010 by Deirdre Pippins, RN Outcome: Progressing 02/17/2023 2009 by Deirdre Pippins, RN Outcome: Progressing   Problem: Nutrition: Goal: Adequate nutrition will be maintained Outcome: Progressing   Problem: Coping: Goal: Level of anxiety will decrease Outcome: Progressing   Problem: Elimination: Goal: Will not experience complications related to bowel motility Outcome: Progressing Goal: Will not experience complications related to urinary retention Outcome: Progressing   Problem: Pain  Managment: Goal: General experience of comfort will improve Outcome: Progressing   Problem: Safety: Goal: Ability to remain free from injury will improve 02/17/2023 2010 by Deirdre Pippins, RN Outcome: Progressing 02/17/2023 2009 by Deirdre Pippins, RN Outcome: Progressing   Problem: Skin Integrity: Goal: Risk for impaired skin integrity will decrease 02/17/2023 2010 by Deirdre Pippins, RN Outcome: Progressing 02/17/2023 2009 by Deirdre Pippins, RN Outcome: Progressing

## 2023-02-17 NOTE — Progress Notes (Signed)
Pt running SVT, longest pause 2.79 sec. Pt denies any discomfort. Dr. Mansy/hospitalist notified. Will continue to monitor the pt.

## 2023-02-18 DIAGNOSIS — J441 Chronic obstructive pulmonary disease with (acute) exacerbation: Secondary | ICD-10-CM | POA: Diagnosis not present

## 2023-02-18 DIAGNOSIS — L03115 Cellulitis of right lower limb: Secondary | ICD-10-CM | POA: Diagnosis not present

## 2023-02-18 DIAGNOSIS — L02415 Cutaneous abscess of right lower limb: Secondary | ICD-10-CM | POA: Diagnosis not present

## 2023-02-18 DIAGNOSIS — I480 Paroxysmal atrial fibrillation: Secondary | ICD-10-CM | POA: Diagnosis not present

## 2023-02-18 MED ORDER — SENNOSIDES-DOCUSATE SODIUM 8.6-50 MG PO TABS
2.0000 | ORAL_TABLET | Freq: Two times a day (BID) | ORAL | Status: DC
  Administered 2023-02-18 – 2023-03-04 (×25): 2 via ORAL
  Filled 2023-02-18 (×29): qty 2

## 2023-02-18 MED ORDER — DOXYCYCLINE HYCLATE 100 MG PO TABS
100.0000 mg | ORAL_TABLET | Freq: Two times a day (BID) | ORAL | Status: AC
  Administered 2023-02-18 – 2023-02-25 (×16): 100 mg via ORAL
  Filled 2023-02-18 (×16): qty 1

## 2023-02-18 NOTE — Progress Notes (Signed)
Progress Note   Patient: Erik [name] [id number]:[id number] DOB: 07-Sep-1955 DOA: 02/16/2023     2 DOS: the patient was seen and examined on 02/18/2023   Brief hospital course: Erik [name] is a 67 y.o. male with medical history significant of paroxysmal atrial fibrillation not on anticoagulation, chronic systolic congestive heart failure with ejection fraction 35 to 40%, moderate pulm hypertension, COPD, chronic hypoxemia on 4 L oxygen, coronary disease, essential hypertension, who present to the hospital with complaints of worsening wounds in the right leg.  Patient is treated with vancomycin and Rocephin for cellulitis and wound infection. Patient was also treated with IV steroids for COPD exacerbation.  CT chest ruled out PE.   Principal Problem:   Cellulitis and abscess of right leg Active Problems:   Venous ulcer (HCC)   COPD exacerbation (HCC)   PAD (peripheral artery disease) (HCC)   Paroxysmal atrial fibrillation with RVR (HCC)   Chronic systolic CHF (congestive heart failure) (HCC)   Obesity (BMI 30-39.9)   Chronic respiratory failure with hypoxia (HCC)   Sepsis (HCC)   Moderate pulmonary hypertension (HCC)   Compression fracture of T11 vertebra (HCC)   Multiple lung nodules   Assessment and Plan: Sepsis secondary to right leg cellulitis. Right lower extremity cellulitis with wound infection. Peripheral arterial disease. Patient met sepsis criteria with tachycardia, tachypnea, leukocytosis.  Lactic acid level mildly elevated.  The source is right leg infection.   started antibiotics with Rocephin and vancomycin. Patient was seen by vascular surgery, no need for additional intervention.  Seen by podiatry, no need for debridement. Condition seem to be improving, change antibiotic to doxycycline and Rocephin.  Discontinue vancomycin.   COPD exacerbation. Chronic hypoxemic respiratory failure. Patient has some bronchospasm, will start IV steroids.  Continue bronchodilator.   Continue oxygen treatment. Patient has significant elevation of D-dimer, CT angiogram ruled out PE. 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  PT/OT recommended nursing home placement.  Patient has refused.   Lung nodules. Inflammatory versus true nodules.  Need to follow-up with repeat imaging in 3 months.   Paroxysmal atrial fibrillation with RVR. Patient had a significant tachycardia appears to be secondary to sepsis.  I will continue metoprolol, also added diltiazem. Patient was not chronically on anticoagulation.   Heart rate is better controlled today.   Chronic systolic congestive heart failure.   Moderate pulmonary hypertension. Patient does not have exacerbation of congestive heart failure.  BNP only mildly elevated.  Will restart home dose beta-blocker, restart torsemide at 40 mg twice a day.   Essential hypertension. Coronary artery disease. Continue home medicines.   T11 compression fracture. Likely old fracture, no recent fall.  Will follow.       Subjective:  Patient still has significant short of breath with min exertion.  Cough, nonproductive.   Physical Exam: Vitals:   02/17/23 2039 02/17/23 2047 02/18/23 0514 02/18/23 0811  BP:  126/86 131/87   Pulse:  89 74   Resp:  18 18   Temp:      TempSrc:      SpO2: 99% 99% 100% 97%  Weight:      Height:       General exam: Appears calm and comfortable  Respiratory system: Decreased breath sounds with minimal air movement. Respiratory effort normal. Cardiovascular system: S1 & S2 heard, RRR. No JVD, murmurs, rubs, gallops or clicks. No pedal edema. Gastrointestinal system: Abdomen is nondistended, soft and nontender. No organomegaly or masses  felt. Normal bowel sounds heard. Central nervous system: Alert and oriented. No focal neurological deficits. Extremities: Leg redness much improved. Skin: No rashes, lesions or ulcers Psychiatry: Judgement and insight appear normal.  Mood & affect appropriate.    Data Reviewed:  Lab test reviewed.  Family Communication: Not able to reach wife  Disposition: Status is: Inpatient Remains inpatient appropriate because: Severity of disease, IV treatment.     Time spent: 35 minutes  Author: Marrion Coy, MD 02/18/2023 12:10 PM  For on call review [email or web address].

## 2023-02-18 NOTE — Plan of Care (Signed)

## 2023-02-18 NOTE — Progress Notes (Signed)
Patient is now requesting a wheelchair. CSW spoke with [employer] and patient doesn't qualify for a wheelchair. Patient would have to pay out of pocket. Patient accepted a walker from his insurance yesterday 02/17/23. Patients insurance will only cover one equipment item.

## 2023-02-18 NOTE — Plan of Care (Signed)
  Problem: Respiratory: Goal: Ability to maintain adequate ventilation will improve Outcome: Progressing   Problem: Pain Managment: Goal: General experience of comfort will improve Outcome: Progressing   Problem: Safety: Goal: Ability to remain free from injury will improve Outcome: Progressing   Problem: Skin Integrity: Goal: Risk for impaired skin integrity will decrease Outcome: Progressing   

## 2023-02-19 DIAGNOSIS — R1310 Dysphagia, unspecified: Secondary | ICD-10-CM

## 2023-02-19 DIAGNOSIS — J441 Chronic obstructive pulmonary disease with (acute) exacerbation: Secondary | ICD-10-CM | POA: Diagnosis not present

## 2023-02-19 DIAGNOSIS — L03115 Cellulitis of right lower limb: Secondary | ICD-10-CM | POA: Diagnosis not present

## 2023-02-19 DIAGNOSIS — L02415 Cutaneous abscess of right lower limb: Secondary | ICD-10-CM | POA: Diagnosis not present

## 2023-02-19 MED ORDER — PANTOPRAZOLE SODIUM 20 MG PO TBEC
20.0000 mg | DELAYED_RELEASE_TABLET | Freq: Every day | ORAL | Status: DC
  Filled 2023-02-19: qty 1

## 2023-02-19 MED ORDER — ONDANSETRON HCL 4 MG/2ML IJ SOLN
4.0000 mg | INTRAMUSCULAR | Status: DC | PRN
  Administered 2023-02-19: 4 mg via INTRAVENOUS
  Filled 2023-02-19: qty 2

## 2023-02-19 NOTE — Plan of Care (Signed)

## 2023-02-19 NOTE — Evaluation (Signed)
Clinical/Bedside Swallow Evaluation Patient Details  Name: Erik [name] MRN: [id number] Date of Birth: 14-Jun-1956  Today's Date: 02/19/2023 Time: SLP Start Time (ACUTE ONLY): 1135 SLP Stop Time (ACUTE ONLY): 1150 SLP Time Calculation (min) (ACUTE ONLY): 15 min  Past Medical History:  Past Medical History:  Diagnosis Date   Arthritis    Asthma    Atrial fibrillation (HCC)    CHF (congestive heart failure) (HCC)    COPD (chronic obstructive pulmonary disease) (HCC)    Coronary artery disease    GERD (gastroesophageal reflux disease)    Hypertension    Myocardial infarction (HCC)    Shortness of breath dyspnea    Past Surgical History:  Past Surgical History:  Procedure Laterality Date   ANGIOPLASTY  2009   CARDIAC CATHETERIZATION N/A 03/18/2015   Procedure: Left Heart Cath with Coronary Angiography;  Surgeon: Lamar Blinks, MD;  Location: [facility] INVASIVE CV LAB;  Service: Cardiovascular;  Laterality: N/A;   CHOLECYSTECTOMY  2005   CORONARY ANGIOGRAM  2009   LOWER EXTREMITY ANGIOGRAPHY Right 08/31/2022   Procedure: Lower Extremity Angiography;  Surgeon: Renford Dills, MD;  Location: [facility] INVASIVE CV LAB;  Service: Cardiovascular;  Laterality: Right;   HPI:  Erik [name] is a 67 y.o. male with medical history significant of paroxysmal atrial fibrillation not on anticoagulation, chronic systolic congestive heart failure with ejection fraction 35 to 40%, moderate pulm hypertension, COPD, chronic hypoxemia on 4 L oxygen, coronary disease, essential hypertension, who present to the hospital with complaints of worsening wounds in the right leg.  Patient is treated with vancomycin and Rocephin for cellulitis and wound infection.  Patient was also treated with IV steroids for COPD exacerbation.  CT chest ruled out PE. Chest x-ray on 02/16/2023 revealed coarsened interstitial markings particularly in the lung bases are  again seen, unchanged. Lucency in the upper lungs is unchanged   consistent with underlying emphysema. There is no new focal airspace  opacity. There is no pulmonary edema. There is no pleural effusion  or pneumothorax.    Assessment / Plan / Recommendation  Clinical Impression  When entering pt's room, nurse was present and pt was gasping for air d/t self-reported back pain (located in center of his back 9 out of 10 pain). Despite gasping and increased work of breathing, pt consumed whole tylenol with thin liquidsvia straw without any overt s/s of aspiration when partially reclined in chair. Pt was observed stating that "this isn't going to touch the pain (referring to tylenol)" and impulsively blowing on the incentive spirometer stating that "this doesn't work either." Pt was able to cease increased work of breathing to discuss any concerns related to dysphagia and he was not SOB when SLP left pt correctly sucking on the incentive spirometer. Pt states that he has baseline difficulty consuming medicine whole - he nurse reports that pt has been agreeable to consuming with applesauce with no incidence. He states that some regular diet textures are harder to consume d/t edentulous status but he refused a dysphagia 3 diet because "he can't stand a chopped diet, he will cut his own food. Just because I don't have no teeth doesn't mean I want to give up." Will recommend medicine be administered whole with applesauce. ST services are not indicated at this time. SLP Visit Diagnosis: Dysphagia, unspecified (R13.10)    Aspiration Risk  Mild aspiration risk    Diet Recommendation Regular;Thin liquid    Liquid Administration via: Cup;Straw Medication Administration: Whole meds with puree  Supervision: Patient able to self feed Compensations: Minimize environmental distractions;Slow rate;Small sips/bites Postural Changes: Seated upright at 90 degrees;Remain upright for at least 30 minutes after po intake    Other  Recommendations Oral Care Recommendations: Oral care BID     Recommendations for follow up therapy are one component of a multi-disciplinary discharge planning process, led by the attending physician.  Recommendations may be updated based on patient status, additional functional criteria and insurance authorization.  Follow up Recommendations No SLP follow up      Assistance Recommended at Discharge  TBD  Functional Status Assessment Patient has not had a recent decline in their functional status  Frequency and Duration   N/A         Prognosis   N/A     Swallow Study   General Date of Onset: 02/19/23 HPI: Erik [name] is a 67 y.o. male with medical history significant of paroxysmal atrial fibrillation not on anticoagulation, chronic systolic congestive heart failure with ejection fraction 35 to 40%, moderate pulm hypertension, COPD, chronic hypoxemia on 4 L oxygen, coronary disease, essential hypertension, who present to the hospital with complaints of worsening wounds in the right leg.  Patient is treated with vancomycin and Rocephin for cellulitis and wound infection.  Patient was also treated with IV steroids for COPD exacerbation.  CT chest ruled out PE. Chest x-ray on 02/16/2023 revealed coarsened interstitial markings particularly in the lung bases are  again seen, unchanged. Lucency in the upper lungs is unchanged  consistent with underlying emphysema. There is no new focal airspace  opacity. There is no pulmonary edema. There is no pleural effusion  or pneumothorax. Type of Study: Bedside Swallow Evaluation Previous Swallow Assessment: none in chart Diet Prior to this Study: Regular;Thin liquids (Level 0) Temperature Spikes Noted: No Respiratory Status: Nasal cannula (4 liters) History of Recent Intubation: No Behavior/Cognition: Alert;Cooperative;Impulsive Oral Cavity Assessment: Within Functional Limits Oral Care Completed by SLP: No Oral Cavity - Dentition: Edentulous Vision: Functional for self-feeding Self-Feeding Abilities:  Able to feed self Patient Positioning: Partially reclined Baseline Vocal Quality: Normal Volitional Cough: Strong Volitional Swallow: Able to elicit    Oral/Motor/Sensory Function Overall Oral Motor/Sensory Function: Within functional limits   Ice Chips Ice chips: Not tested   Thin Liquid Thin Liquid: Within functional limits Presentation: Self Fed;Straw    Nectar Thick Nectar Thick Liquid: Not tested   Honey Thick Honey Thick Liquid: Not tested   Puree Puree: Within functional limits Presentation: Spoon;Self Fed   Solid     Solid: Within functional limits Presentation: Self Fed     Britton Perkinson B. Dreama Saa, M.S., CCC-SLP, Tree surgeon Certified Brain Injury Specialist [facility]  [facility] Rehabilitation Services Office [phone] Ascom [phone] Fax [phone]

## 2023-02-19 NOTE — Progress Notes (Signed)
Progress Note   Patient: Erik [name] ZOX:[id number] DOB: January 27, 1956 DOA: 02/16/2023     3 DOS: the patient was seen and examined on 02/19/2023   Brief hospital course: SHOLOM BULA is a 67 y.o. male with medical history significant of paroxysmal atrial fibrillation not on anticoagulation, chronic systolic congestive heart failure with ejection fraction 35 to 40%, moderate pulm hypertension, COPD, chronic hypoxemia on 4 L oxygen, coronary disease, essential hypertension, who present to the hospital with complaints of worsening wounds in the right leg.  Patient is treated with vancomycin and Rocephin for cellulitis and wound infection. Patient was also treated with IV steroids for COPD exacerbation.  CT chest ruled out PE.   Principal Problem:   Cellulitis and abscess of right leg Active Problems:   Venous ulcer (HCC)   COPD exacerbation (HCC)   PAD (peripheral artery disease) (HCC)   Paroxysmal atrial fibrillation with RVR (HCC)   Chronic systolic CHF (congestive heart failure) (HCC)   Obesity (BMI 30-39.9)   Chronic respiratory failure with hypoxia (HCC)   Sepsis (HCC)   Moderate pulmonary hypertension (HCC)   Compression fracture of T11 vertebra (HCC)   Multiple lung nodules   Assessment and Plan: Sepsis secondary to right leg cellulitis. Right lower extremity cellulitis with wound infection. Peripheral arterial disease. Patient met sepsis criteria with tachycardia, tachypnea, leukocytosis.  Lactic acid level mildly elevated.  The source is right leg infection.   started antibiotics with Rocephin and vancomycin. Patient was seen by vascular surgery, no need for additional intervention.  Seen by podiatry, no need for debridement. Condition seem to be improving, change antibiotic to doxycycline and Rocephin.  Discontinued vancomycin. Will continue 1 more day of IV antibiotics, planning to discharge tomorrow.  Dysphagia. Patient complaining of dysphagia with choking episodes,  obtain speech therapy evaluation.   COPD exacerbation. Chronic hypoxemic respiratory failure. Patient has some bronchospasm, will start IV steroids.  Continue bronchodilator.  Continue oxygen treatment. Patient has significant elevation of D-dimer, CT angiogram ruled out PE. 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  PT/OT recommended nursing home placement.  Patient has refused. Continue steroids and bronchodilator.   Lung nodules. Inflammatory versus true nodules.  Need to follow-up with repeat imaging in 3 months.   Paroxysmal atrial fibrillation with RVR. Patient had a significant tachycardia appears to be secondary to sepsis.  I will continue metoprolol, also added diltiazem. Patient was not chronically on anticoagulation.   Heart rate is better controlled today.   Chronic systolic congestive heart failure.   Moderate pulmonary hypertension. Patient does not have exacerbation of congestive heart failure.  BNP only mildly elevated.  Will restart home dose beta-blocker, restart torsemide at 40 mg twice a day.   Essential hypertension. Coronary artery disease. Continue home medicines.   T11 compression fracture. Likely old fracture, no recent fall.  Will follow.      Subjective:  Patient still complaining of dysphagia and choking episode.  Still short of breath with exertion.  Physical Exam: Vitals:   02/19/23 0016 02/19/23 0514 02/19/23 0816 02/19/23 0836  BP: 122/65 116/87 111/89   Pulse: 76 67 70   Resp: 18 18 18    Temp: 97.6 F (36.4 C)  97.6 F (36.4 C)   TempSrc:      SpO2: 94% 97% 97% 95%  Weight:      Height:       General exam: Appears calm and comfortable  Respiratory system: Decreased breathing sounds. Respiratory  effort normal. Cardiovascular system: Irregular. No JVD, murmurs, rubs, gallops or clicks. No pedal edema. Gastrointestinal system: Abdomen is nondistended, soft and nontender. No organomegaly or masses  felt. Normal bowel sounds heard. Central nervous system: Alert and oriented. No focal neurological deficits. Extremities: Leg wounds improving. Skin: No rashes, lesions or ulcers Psychiatry: Judgement and insight appear normal. Mood & affect appropriate.    Data Reviewed:  There are no new results to review at this time.  Family Communication: None  Disposition: Status is: Inpatient Remains inpatient appropriate because: Severity of disease, IV treatment.     Time spent: 35 minutes  Author: Marrion Coy, MD 02/19/2023 11:34 AM  For on call review [email or web address].

## 2023-02-20 ENCOUNTER — Inpatient Hospital Stay: Payer: HMO

## 2023-02-20 DIAGNOSIS — S22080S Wedge compression fracture of T11-T12 vertebra, sequela: Secondary | ICD-10-CM | POA: Diagnosis not present

## 2023-02-20 DIAGNOSIS — L03115 Cellulitis of right lower limb: Secondary | ICD-10-CM | POA: Diagnosis not present

## 2023-02-20 DIAGNOSIS — J441 Chronic obstructive pulmonary disease with (acute) exacerbation: Secondary | ICD-10-CM | POA: Diagnosis not present

## 2023-02-20 DIAGNOSIS — L02415 Cutaneous abscess of right lower limb: Secondary | ICD-10-CM | POA: Diagnosis not present

## 2023-02-20 DIAGNOSIS — S32030A Wedge compression fracture of third lumbar vertebra, initial encounter for closed fracture: Secondary | ICD-10-CM | POA: Insufficient documentation

## 2023-02-20 LAB — CULTURE, BLOOD (ROUTINE X 2): Special Requests: ADEQUATE

## 2023-02-20 MED ORDER — VALGANCICLOVIR HCL 50 MG/ML PO SOLR: 900.0000 mg | Freq: Every day | ORAL | Status: DC

## 2023-02-20 MED ORDER — PANTOPRAZOLE SODIUM 40 MG PO TBEC
40.0000 mg | DELAYED_RELEASE_TABLET | Freq: Every day | ORAL | Status: DC
  Administered 2023-02-21 – 2023-03-04 (×11): 40 mg via ORAL
  Filled 2023-02-20 (×13): qty 1

## 2023-02-20 MED ORDER — CEPHALEXIN 500 MG PO CAPS
500.0000 mg | ORAL_CAPSULE | Freq: Three times a day (TID) | ORAL | Status: AC
  Administered 2023-02-20 – 2023-02-23 (×9): 500 mg via ORAL
  Filled 2023-02-20 (×9): qty 1

## 2023-02-20 MED ORDER — PANTOPRAZOLE SODIUM 20 MG PO TBEC
20.0000 mg | DELAYED_RELEASE_TABLET | Freq: Every day | ORAL | Status: DC
  Administered 2023-02-20: 20 mg via ORAL
  Filled 2023-02-20: qty 1

## 2023-02-20 MED ORDER — LIDOCAINE 5 % EX PTCH
1.0000 | MEDICATED_PATCH | CUTANEOUS | Status: DC
  Administered 2023-02-20 – 2023-03-01 (×7): 1 via TRANSDERMAL
  Filled 2023-02-20 (×12): qty 1

## 2023-02-20 NOTE — TOC Progression Note (Addendum)
Transition of Care [facility]) - Progression Note    Patient Details  Name: Erik [name] MRN: [id number] Date of Birth: 02/09/1956  Transition of Care [facility]) CM/SW Contact  Marlowe Sax, RN Phone Number: 02/20/2023, 10:00 AM  Clinical Narrative:     [facility] will not accept the patient back due to non compliance with keeping medical appointments Sent referral to [facility] with [facility], waiting to see if they can accept  Coralee North  with [facility] accepted the patient and will go see him at the bedside today  Update, Coralee North at [facility] contacted me and stated that they will not be able to accept the patient for [facility] due to non compliance with his appointments  I reached out to [facility] to see if they could see the patient at home with house call  Left a VM for her to return my call  Expected Discharge Plan: Home w Home Health Services Barriers to Discharge: No Barriers Identified  Expected Discharge Plan and Services   Discharge Planning Services: CM Consult   Living arrangements for the past 2 months: Single Family Home                 DME Arranged: Walker rolling with seat DME Agency: [employer] Date DME Agency Contacted: 02/17/23 Time DME Agency Contacted: [phone] Representative spoke with at DME Agency: [facility] HH Arranged: RN, PT [facility] Agency: [facility] Date [facility] Agency Contacted: 02/17/23 Time HH Agency Contacted: [phone] Representative spoke with at [facility] Agency: [name]   Social Determinants of Health (SDOH) Interventions SDOH Screenings   Food Insecurity: No Food Insecurity (02/16/2023)  Housing: Low Risk  (02/16/2023)  Transportation Needs: No Transportation Needs (02/16/2023)  Utilities: Not At Risk (02/16/2023)  Alcohol Screen: Low Risk  (02/19/2020)  Depression (PHQ2-9): Low Risk  (08/25/2020)  Financial Resource Strain: Low Risk  (11/04/2020)  Tobacco Use: Medium Risk (02/16/2023)    Readmission Risk Interventions     No data to display

## 2023-02-20 NOTE — Plan of Care (Signed)

## 2023-02-20 NOTE — Care Management Important Message (Signed)
Important Message  Patient Details  Name: Erik [name] MRN: [id number] Date of Birth: [birth year]   Medicare Important Message Given:  N/A - LOS <3 / Initial given by admissions     Olegario Messier A Trivia Heffelfinger 02/20/2023, 9:32 AM

## 2023-02-20 NOTE — Progress Notes (Signed)
Physical Therapy Treatment Patient Details Name: Erik [name] MRN: [id number] DOB: [birth year] Today's Date: 02/20/2023   History of Present Illness This is a 67 y.o. male past medical history significant for peripheral arterial disease, CAD, CHF, atrial fibrillation, COPD. Admitted due to SOB, cellulitis of LE, and a venous ulcer.    PT Comments  Patient declined getting out of bed due to ongoing back pain. Encouraged patient to sit head of bed up and for routine repositioning for skin integrity and upright conditioning. The patient complains of increased back pain initially with raising head of bed. He participated with LE exercises for strengthening. Limited progress overall. Recommend to continue PT to maximize independence and to decrease caregiver burden.     Assistance Recommended at Discharge Intermittent Supervision/Assistance  If plan is discharge home, recommend the following:  Can travel by private vehicle    A lot of help with walking and/or transfers;A lot of help with bathing/dressing/bathroom;Assistance with cooking/housework;Assist for transportation;Help with stairs or ramp for entrance   No  Equipment Recommendations  None recommended by PT    Recommendations for Other Services       Precautions / Restrictions Precautions Precautions: Fall Precaution Comments: back pain Restrictions Weight Bearing Restrictions: No     Mobility  Bed Mobility Overal bed mobility: Needs Assistance             General bed mobility comments: patient refusing to sit up on edge of bed due to pain. head of bed raised from 25 to 30 degrees with increase in back pain reported. encouraged frequent position changes to help with pain management. patient reports he may want to get to the chair later today    Transfers                        Ambulation/Gait                   Stairs             Wheelchair Mobility     Tilt Bed    Modified Rankin  (Stroke Patients Only)       Balance                                            Cognition Arousal/Alertness: Awake/alert Behavior During Therapy: Anxious Overall Cognitive Status: Within Functional Limits for tasks assessed                                          Exercises General Exercises - Lower Extremity Ankle Circles/Pumps: AROM, Strengthening, Both, 10 reps, Supine Heel Slides: AAROM, Strengthening, Both, 10 reps, Supine Hip ABduction/ADduction: AAROM, Strengthening, Both, 10 reps, Supine Other Exercises Other Exercises: verbal and visual cues for exercise technique for strengthening. patient reports pain on the left heel- encouraged elevating heels off bed to prevent from pressure related injuries    General Comments        Pertinent Vitals/Pain Pain Assessment Pain Assessment: Faces Faces Pain Scale: Hurts even more Pain Location: Mid Back Pain Descriptors / Indicators: Discomfort, Spasm Pain Intervention(s): Limited activity within patient's tolerance, Monitored during session, Repositioned    Home Living  Prior Function            PT Goals (current goals can now be found in the care plan section) Acute Rehab PT Goals Patient Stated Goal: to get better PT Goal Formulation: With patient Time For Goal Achievement: 03/03/23 Potential to Achieve Goals: Poor Progress towards PT goals: Progressing toward goals    Frequency    Min 1X/week      PT Plan Current plan remains appropriate    Co-evaluation              AM-PAC PT "6 Clicks" Mobility   Outcome Measure  Help needed turning from your back to your side while in a flat bed without using bedrails?: Total Help needed moving from lying on your back to sitting on the side of a flat bed without using bedrails?: Total Help needed moving to and from a bed to a chair (including a wheelchair)?: A Lot Help needed standing up  from a chair using your arms (e.g., wheelchair or bedside chair)?: A Lot Help needed to walk in hospital room?: A Lot Help needed climbing 3-5 steps with a railing? : Total 6 Click Score: 9    End of Session Equipment Utilized During Treatment: Oxygen Activity Tolerance: Patient limited by pain;Patient limited by fatigue Patient left: in bed;with call bell/phone within reach;with bed alarm set   PT Visit Diagnosis: Unsteadiness on feet (R26.81);Muscle weakness (generalized) (M62.81);Pain;History of falling (Z91.81);Difficulty in walking, not elsewhere classified (R26.2)     Time: 9563-8756 PT Time Calculation (min) (ACUTE ONLY): 19 min  Charges:    $Therapeutic Exercise: 8-22 mins PT General Charges $$ ACUTE PT VISIT: 1 Visit                     Donna Bernard, PT, MPT    Ina Homes 02/20/2023, 3:25 PM

## 2023-02-20 NOTE — Progress Notes (Signed)
Progress Note   Patient: Erik [name] EXB:[id number] DOB: 08-Feb-1956 DOA: 02/16/2023     4 DOS: the patient was seen and examined on 02/20/2023   Brief hospital course: OZZIE KNOBEL is a 67 y.o. male with medical history significant of paroxysmal atrial fibrillation not on anticoagulation, chronic systolic congestive heart failure with ejection fraction 35 to 40%, moderate pulm hypertension, COPD, chronic hypoxemia on 4 L oxygen, coronary disease, essential hypertension, who present to the hospital with complaints of worsening wounds in the right leg.  Patient is treated with vancomycin and Rocephin for cellulitis and wound infection. Patient was also treated with IV steroids for COPD exacerbation.  CT chest ruled out PE.   Principal Problem:   Cellulitis and abscess of right leg Active Problems:   Venous ulcer (HCC)   COPD exacerbation (HCC)   PAD (peripheral artery disease) (HCC)   Paroxysmal atrial fibrillation with RVR (HCC)   Chronic systolic CHF (congestive heart failure) (HCC)   Obesity (BMI 30-39.9)   Chronic respiratory failure with hypoxia (HCC)   Sepsis (HCC)   Moderate pulmonary hypertension (HCC)   Compression fracture of T11 vertebra (HCC)   Multiple lung nodules   Dysphagia   Closed compression fracture of L3 vertebra (HCC)   Assessment and Plan: Sepsis secondary to right leg cellulitis. Right lower extremity cellulitis with wound infection. Peripheral arterial disease. Patient met sepsis criteria with tachycardia, tachypnea, leukocytosis.  Lactic acid level mildly elevated.  The source is right leg infection.   started antibiotics with Rocephin and vancomycin. Patient was seen by vascular surgery, no need for additional intervention.  Seen by podiatry, no need for debridement. Condition seem to be improving, change antibiotic to doxycycline and Rocephin.  Discontinued vancomycin. Condition improving, will continue antibiotics with doxycycline and Keflex for  additional 4 days.   Dysphagia. Seen by speech therapy, no evidence of aspiration.   COPD exacerbation. Chronic hypoxemic respiratory failure. Patient has some bronchospasm, will start IV steroids.  Continue bronchodilator.  Continue oxygen treatment. Patient has significant elevation of D-dimer, CT angiogram ruled out PE. 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  PT/OT recommended nursing home placement.  Patient has refused. Continue steroids and bronchodilator. Patient condition appears to be improving, but he refused to go home today.  Will try to discharge tomorrow.   Lung nodules. Inflammatory versus true nodules.  Need to follow-up with repeat imaging in 3 months.   Paroxysmal atrial fibrillation with RVR. Patient had a significant tachycardia appears to be secondary to sepsis.  I will continue metoprolol, also added diltiazem. Patient was not chronically on anticoagulation.   Heart rate is better controlled today.   Chronic systolic congestive heart failure.   Moderate pulmonary hypertension. Patient does not have exacerbation of congestive heart failure.  BNP only mildly elevated.  Will restart home dose beta-blocker, restart torsemide at 40 mg twice a day.   Essential hypertension. Coronary artery disease. Continue home medicines.   T11 compression fracture. L3 compression fracture. Discussed with patient, fractures happened a while ago, he currently has back braces at home.  Asked him to bring back and wear it. Add Lidoderm patch.      Subjective:  Patient seems to be doing better today.  Physical Exam: Vitals:   02/19/23 1845 02/19/23 2239 02/20/23 0723 02/20/23 0723  BP:  (!) 152/83 (!) 144/84 113/64  Pulse:  100 71 (!) 55  Resp:  20 17 14   Temp:  97.8 F (  36.6 C) 97.8 F (36.6 C) 98.6 F (37 C)  TempSrc:      SpO2: 96% 97% 95% 93%  Weight:      Height:       General exam: Appears calm and comfortable   Respiratory system: Decreased breathing sounds. Respiratory effort normal. Cardiovascular system: S1 & S2 heard, RRR. No JVD, murmurs, rubs, gallops or clicks. No pedal edema. Gastrointestinal system: Abdomen is nondistended, soft and nontender. No organomegaly or masses felt. Normal bowel sounds heard. Central nervous system: Alert and oriented. No focal neurological deficits. Extremities: Symmetric 5 x 5 power. Skin: No rashes, lesions or ulcers Psychiatry: Judgement and insight appear normal. Mood & affect appropriate.    Data Reviewed:  Lab results reviewed.  X-ray results reviewed.  Family Communication: None  Disposition: Status is: Inpatient Remains inpatient appropriate because: Severity of disease,      Time spent: 35 minutes  Author: Marrion Coy, MD 02/20/2023 1:35 PM  For on call review [email or web address].

## 2023-02-21 ENCOUNTER — Inpatient Hospital Stay: Payer: HMO

## 2023-02-21 DIAGNOSIS — J441 Chronic obstructive pulmonary disease with (acute) exacerbation: Secondary | ICD-10-CM | POA: Diagnosis not present

## 2023-02-21 DIAGNOSIS — L02415 Cutaneous abscess of right lower limb: Secondary | ICD-10-CM | POA: Diagnosis not present

## 2023-02-21 DIAGNOSIS — S22080A Wedge compression fracture of T11-T12 vertebra, initial encounter for closed fracture: Secondary | ICD-10-CM

## 2023-02-21 DIAGNOSIS — L03115 Cellulitis of right lower limb: Secondary | ICD-10-CM | POA: Diagnosis not present

## 2023-02-21 DIAGNOSIS — S32030A Wedge compression fracture of third lumbar vertebra, initial encounter for closed fracture: Secondary | ICD-10-CM

## 2023-02-21 DIAGNOSIS — S22080S Wedge compression fracture of T11-T12 vertebra, sequela: Secondary | ICD-10-CM | POA: Diagnosis not present

## 2023-02-21 LAB — CULTURE, BLOOD (ROUTINE X 2)
Culture: NO GROWTH
Culture: NO GROWTH
Special Requests: ADEQUATE

## 2023-02-21 MED ORDER — TRAMADOL HCL 50 MG PO TABS
50.0000 mg | ORAL_TABLET | Freq: Four times a day (QID) | ORAL | Status: DC | PRN
  Administered 2023-02-21 – 2023-03-01 (×15): 50 mg via ORAL
  Filled 2023-02-21 (×15): qty 1

## 2023-02-21 MED ORDER — PREDNISONE 20 MG PO TABS
20.0000 mg | ORAL_TABLET | Freq: Every day | ORAL | Status: DC
  Administered 2023-02-21 – 2023-02-23 (×3): 20 mg via ORAL
  Filled 2023-02-21 (×3): qty 1

## 2023-02-21 NOTE — Progress Notes (Signed)
   02/21/23 1600  Spiritual Encounters  Type of Visit Initial  Care provided to: Patient  Referral source Chaplain assessment  Reason for visit Routine spiritual support  OnCall Visit No  Spiritual Framework  Presenting Themes Meaning/purpose/sources of inspiration  Community/Connection Family  Patient Stress Factors Health changes  Family Stress Factors Health changes (Wife)  Interventions  Spiritual Care Interventions Made Established relationship of care and support;Compassionate presence;Reflective listening;Prayer;Encouragement  Intervention Outcomes  Outcomes Connection to spiritual care;Awareness of health;Awareness of support;Reduced anxiety  Spiritual Care Plan  Spiritual Care Issues Still Outstanding No further spiritual care needs at this time (see row info)   Chaplain visited with patient and provided compassionate care. Chaplain spiritual support services remain available as the need arises.

## 2023-02-21 NOTE — Consult Note (Signed)
Neurosurgery-New Consultation Evaluation 02/21/2023 [name] [id number]  Identifying Statement: [name] is a 67 y.o. male [name] [id number] with history of COPD on 4 L of oxygen, CHF, A-fib, CAD, GERD, hypertension, MI, obesity, chronic compression fractures.  Physician Requesting Consultation: No ref. provider found  History of Present Illness: [name] is a 67 year old with multiple comorbidities presenting to the hospital with complaints of worsening wounds on his right leg.  He was treated with antibiotics for cellulitis and wound infection and with IV steroids for COPD exacerbation.  He in addition to this he complains of chronic low back pain without radiation into his legs has been going on for several years.  He states that his symptoms are intermittent however several days ago he was getting out of bed and felt a pop in his back and has had worsening low back pain since then.  The pain radiates from left to right but not into his legs.  A T11 compression fracture was noted on a CT chest which was done for PE rule out.  Due to continued complaints of low back pain, he underwent x-rays of his lumbar spine which showed a possible acute L3 compression fracture which is moderate in nature.  Past Medical History:  Past Medical History:  Diagnosis Date   Arthritis    Asthma    Atrial fibrillation (HCC)    CHF (congestive heart failure) (HCC)    COPD (chronic obstructive pulmonary disease) (HCC)    Coronary artery disease    GERD (gastroesophageal reflux disease)    Hypertension    Myocardial infarction (HCC)    Shortness of breath dyspnea     Social History: Social History   Socioeconomic History   Marital status: Married    Spouse name: Not on file   Number of children: Not on file   Years of education: Not on file   Highest education level: Not on file  Occupational History   Not on file  Tobacco Use   Smoking status: Former    Current packs/day:  0.00    Types: Cigarettes    Start date: 03/20/1972    Quit date: 03/20/1997    Years since quitting: 25.9   Smokeless tobacco: Never  Vaping Use   Vaping status: Never Used  Substance and Sexual Activity   Alcohol use: No   Drug use: No   Sexual activity: Not on file  Other Topics Concern   Not on file  Social History Narrative   Not on file   Social Determinants of Health   Financial Resource Strain: Low Risk  (11/04/2020)   Overall Financial Resource Strain (CARDIA)    Difficulty of Paying Living Expenses: Not hard at all  Food Insecurity: No Food Insecurity (02/16/2023)   Hunger Vital Sign    Worried About Running Out of Food in the Last Year: Never true    Ran Out of Food in the Last Year: Never true  Transportation Needs: No Transportation Needs (02/16/2023)   PRAPARE - Administrator, Civil Service (Medical): No    Lack of Transportation (Non-Medical): No  Physical Activity: Not on file  Stress: Not on file  Social Connections: Not on file  Intimate Partner Violence: Not At Risk (02/16/2023)   Humiliation, Afraid, Rape, and Kick questionnaire    Fear of Current or Ex-Partner: No    Emotionally Abused: No    Physically Abused: No    Sexually Abused: No  Family History: Family History  Problem Relation Age of Onset   Coronary artery disease Father    Diabetes Father    Hyperlipidemia Father    Hypertension Father    Stroke Mother    Hyperlipidemia Mother     Review of Systems:  Review of Systems - General ROS: Negative Psychological ROS: Negative Ophthalmic ROS: Negative ENT ROS: Negative Hematological and Lymphatic ROS: Negative  Endocrine ROS: Negative Respiratory ROS: Negative Cardiovascular ROS: Negative Gastrointestinal ROS: Negative Genito-Urinary ROS: Negative Musculoskeletal ROS: Negative Neurological ROS: Negative Dermatological ROS: Negative  Physical Exam: BP (!) 148/96 (BP Location: Left Arm)   Pulse 86   Temp 98.7 F  (37.1 C)   Resp 17   Ht 5\' 10"  (1.778 m)   Wt 100.7 kg   SpO2 97%   BMI 31.85 kg/m  Body mass index is 31.85 kg/m. Body surface area is 2.23 meters squared. General appearance: Alert, cooperative, in no acute distress Head: Normocephalic, atraumatic Eyes: Normal, EOM intact Oropharynx: Moist without lesions Neck: Supple, no tenderness Heart: Normal, regular rate and rhythm, without murmur Lungs: Clear to auscultation, good air exchange Abdomen: Soft, nondistended Ext: No edema in LE bilaterally, good distal pulses  Neurologic exam:  Mental status: alertness: alert, orientation: person, place, time, affect: normal Speech: fluent and clear Cranial nerves:  CN II-XII grossly intact Motor:strength symmetric 5/5, normal muscle mass and tone in all extremities and no pronator drift Sensory: intact to light touch in all extremities Reflexes: 2+ and symmetric bilaterally for arms and legs except 1+ patellar reflex on the right Coordination: intact finger to nose Gait: Untested  Laboratory: Results for orders placed or performed during the hospital encounter of 02/16/23  Resp panel by RT-PCR (RSV, Flu A&B, Covid) Anterior Nasal Swab   Specimen: Anterior Nasal Swab  Result Value Ref Range   SARS Coronavirus 2 by RT PCR NEGATIVE NEGATIVE   Influenza A by PCR NEGATIVE NEGATIVE   Influenza B by PCR NEGATIVE NEGATIVE   Resp Syncytial Virus by PCR NEGATIVE NEGATIVE  Blood Culture (routine x 2)   Specimen: BLOOD  Result Value Ref Range   Specimen Description BLOOD RIGHT ANTECUBITAL    Special Requests      BOTTLES DRAWN AEROBIC AND ANAEROBIC Blood Culture adequate volume   Culture      NO GROWTH 5 DAYS Performed at [facility], [address] [place], [name] [address]    Report Status 02/21/2023 FINAL   Blood Culture (routine x 2)   Specimen: BLOOD  Result Value Ref Range   Specimen Description BLOOD LEFT ANTECUBITAL    Special Requests      BOTTLES DRAWN AEROBIC  AND ANAEROBIC Blood Culture adequate volume   Culture      NO GROWTH 5 DAYS Performed at [facility], [address] [place], [name] [address]    Report Status 02/21/2023 FINAL   Lactic acid, plasma  Result Value Ref Range   Lactic Acid, Venous 2.4 (HH) 0.5 - 1.9 mmol/L  Lactic acid, plasma  Result Value Ref Range   Lactic Acid, Venous 1.9 0.5 - 1.9 mmol/L  Comprehensive metabolic panel  Result Value Ref Range   Sodium 135 135 - 145 mmol/L   Potassium 4.2 3.5 - 5.1 mmol/L   Chloride 98 98 - 111 mmol/L   CO2 27 22 - 32 mmol/L   Glucose, Bld 117 (H) 70 - 99 mg/dL   BUN 22 8 - 23 mg/dL   Creatinine, Ser 8.29 0.61 -  1.24 mg/dL   Calcium 8.8 (L) 8.9 - 10.3 mg/dL   Total Protein 6.8 6.5 - 8.1 g/dL   Albumin 3.4 (L) 3.5 - 5.0 g/dL   AST 22 15 - 41 U/L   ALT 11 0 - 44 U/L   Alkaline Phosphatase 100 38 - 126 U/L   Total Bilirubin 1.1 0.3 - 1.2 mg/dL   GFR, Estimated >16 >10 mL/min   Anion gap 10 5 - 15  CBC with Differential  Result Value Ref Range   WBC 14.4 (H) 4.0 - 10.5 K/uL   RBC 5.06 4.22 - 5.81 MIL/uL   Hemoglobin 14.2 13.0 - 17.0 g/dL   HCT 96.0 45.4 - 09.8 %   MCV 91.9 80.0 - 100.0 fL   MCH 28.1 26.0 - 34.0 pg   MCHC 30.5 30.0 - 36.0 g/dL   RDW 11.9 14.7 - 82.9 %   Platelets 427 (H) 150 - 400 K/uL   nRBC 0.0 0.0 - 0.2 %   Neutrophils Relative % 83 %   Neutro Abs 11.9 (H) 1.7 - 7.7 K/uL   Lymphocytes Relative 7 %   Lymphs Abs 1.0 0.7 - 4.0 K/uL   Monocytes Relative 9 %   Monocytes Absolute 1.3 (H) 0.1 - 1.0 K/uL   Eosinophils Relative 0 %   Eosinophils Absolute 0.0 0.0 - 0.5 K/uL   Basophils Relative 0 %   Basophils Absolute 0.0 0.0 - 0.1 K/uL   Immature Granulocytes 1 %   Abs Immature Granulocytes 0.11 (H) 0.00 - 0.07 K/uL  Protime-INR  Result Value Ref Range   Prothrombin Time 14.3 11.4 - 15.2 seconds   INR 1.1 0.8 - 1.2  APTT  Result Value Ref Range   aPTT 30 24 - 36 seconds  Brain natriuretic peptide  Result Value Ref Range   B  Natriuretic Peptide 332.0 (H) 0.0 - 100.0 pg/mL  Blood gas, venous  Result Value Ref Range   pH, Ven 7.4 7.25 - 7.43   pCO2, Ven 53 44 - 60 mmHg   Bicarbonate 32.8 (H) 20.0 - 28.0 mmol/L   Acid-Base Excess 6.5 (H) 0.0 - 2.0 mmol/L   O2 Saturation 49.4 %   Patient temperature 37.0    Collection site VEIN   Urinalysis, w/ Reflex to Culture (Infection Suspected) -Urine, Clean Catch  Result Value Ref Range   Specimen Source URINE, CLEAN CATCH    Color, Urine YELLOW (A) YELLOW   APPearance CLEAR (A) CLEAR   Specific Gravity, Urine 1.018 1.005 - 1.030   pH 6.0 5.0 - 8.0   Glucose, UA NEGATIVE NEGATIVE mg/dL   Hgb urine dipstick NEGATIVE NEGATIVE   Bilirubin Urine NEGATIVE NEGATIVE   Ketones, ur 5 (A) NEGATIVE mg/dL   Protein, ur NEGATIVE NEGATIVE mg/dL   Nitrite NEGATIVE NEGATIVE   Leukocytes,Ua NEGATIVE NEGATIVE   RBC / HPF 0-5 0 - 5 RBC/hpf   WBC, UA 0-5 0 - 5 WBC/hpf   Bacteria, UA RARE (A) NONE SEEN   Squamous Epithelial / HPF 0-5 0 - 5 /HPF   Hyaline Casts, UA PRESENT   D-dimer, quantitative  Result Value Ref Range   D-Dimer, Quant 1.87 (H) 0.00 - 0.50 ug/mL-FEU  HIV Antibody (routine testing w rflx)  Result Value Ref Range   HIV Screen 4th Generation wRfx Non Reactive Non Reactive  Cortisol-am, blood  Result Value Ref Range   Cortisol - AM 5.8 (L) 6.7 - 22.6 ug/dL  Procalcitonin  Result Value Ref Range   Procalcitonin <0.10  ng/mL  Basic metabolic panel  Result Value Ref Range   Sodium 136 135 - 145 mmol/L   Potassium 4.6 3.5 - 5.1 mmol/L   Chloride 99 98 - 111 mmol/L   CO2 28 22 - 32 mmol/L   Glucose, Bld 205 (H) 70 - 99 mg/dL   BUN 23 8 - 23 mg/dL   Creatinine, Ser 1.61 0.61 - 1.24 mg/dL   Calcium 8.8 (L) 8.9 - 10.3 mg/dL   GFR, Estimated >09 >60 mL/min   Anion gap 9 5 - 15  CBC  Result Value Ref Range   WBC 10.1 4.0 - 10.5 K/uL   RBC 4.58 4.22 - 5.81 MIL/uL   Hemoglobin 13.3 13.0 - 17.0 g/dL   HCT 45.4 09.8 - 11.9 %   MCV 90.0 80.0 - 100.0 fL   MCH 29.0  26.0 - 34.0 pg   MCHC 32.3 30.0 - 36.0 g/dL   RDW 14.7 82.9 - 56.2 %   Platelets 417 (H) 150 - 400 K/uL   nRBC 0.0 0.0 - 0.2 %  Magnesium  Result Value Ref Range   Magnesium 2.2 1.7 - 2.4 mg/dL  Protime-INR  Result Value Ref Range   Prothrombin Time 13.7 11.4 - 15.2 seconds   INR 1.0 0.8 - 1.2  Troponin I (High Sensitivity)  Result Value Ref Range   Troponin I (High Sensitivity) 9 <18 ng/L  Troponin I (High Sensitivity)  Result Value Ref Range   Troponin I (High Sensitivity) 9 <18 ng/L    Imaging:  I personally reviewed radiology studies to include:  02/21/23 lumbar xrays FINDINGS: Osteopenia. Normal lumbar segmentation. Chronic L2, chronic L2, L4, and L5 compression fractures are stable since February, but moderate new L3 compression fracture since that time. Visible lower thoracic levels and L1 appear radiographically stable, although mild new T11 compression fracture demonstrated by CTA chest 3 days ago.   Stable cholecystectomy clips. Aortoiliac calcified atherosclerosis. Chronic right Common iliac artery vascular stent. Nonobstructed visible bowel gas pattern with retained stool.   IMPRESSION: 1. T11 (mild) and L3 (moderate) compression fractures are new since February. If specific therapy such as vertebroplasty is desired, noncontrast MRI or Nuclear Medicine Whole-body Bone Scan would [place] determine acuity and candidacy for augmentation. 2. Osteopenia with chronic L2, L4, and L5 compression fractures. 3.  Aortic Atherosclerosis (ICD10-I70.0).     Electronically Signed   By: Odessa Fleming M.D.   On: 02/20/2023 12:11     Impression/Plan:     1.  Diagnosis: T11 and L3 compression facture  2.  Plan - TLSO brace for comfort when OOB -Upright x-rays in brace. -Consider MRI should the patient continue to complain of pain and be unable to work therapy. -Will follow-up outpatient.  Manning Charity PA-C Neurosurgery

## 2023-02-21 NOTE — Care Management Important Message (Signed)
Important Message  Patient Details  Name: [name] MRN: [id number] Date of Birth: July 06, 1956   Medicare Important Message Given:  N/A - LOS <3 / Initial given by admissions     Olegario Messier A Kingston Guiles 02/21/2023, 10:48 AM

## 2023-02-21 NOTE — Progress Notes (Signed)
Occupational Therapy Treatment Patient Details Name: [name] MRN: [id number] DOB: Dec 13, 1955 Today's Date: 02/21/2023   History of present illness This is a 67 y.o. male past medical history significant for peripheral arterial disease, CAD, CHF, atrial fibrillation, COPD. Admitted due to SOB, cellulitis of LE, and a venous ulcer.   OT comments  Pt seen for OT treatment on this date. Upon arrival to room pt resting in bed, agreeable to tx. Pt requires MOD A for bed mobility for trunk management to seated EOB position. MOD A with RW to stand. MAX A to fit/donn TLSO brace. Pt reported feeling weak while standing. Pt provided rest break to decrease SOB - pt on 3L [place]. Crepitus noted in knees and  back while moving. Pt declined increase in pain throughout session. Pt making good progress toward goals, will continue to follow POC. Discharge recommendation remains appropriate.     Recommendations for follow up therapy are one component of a multi-disciplinary discharge planning process, led by the attending physician.  Recommendations may be updated based on patient status, additional functional criteria and insurance authorization.    Assistance Recommended at Discharge Intermittent Supervision/Assistance  Patient can return home with the following  A lot of help with walking and/or transfers;A lot of help with bathing/dressing/bathroom;Assistance with cooking/housework;Assist for transportation;Help with stairs or ramp for entrance;Direct supervision/assist for medications management   Equipment Recommendations  Other (comment) (defer to next venue of care)    Recommendations for Other Services      Precautions / Restrictions Precautions Precautions: Fall Precaution Comments: back pain Required Braces or Orthoses: Spinal Brace (for comfort) Spinal Brace: Thoracolumbosacral orthotic;Applied in sitting position Restrictions Weight Bearing Restrictions: No       Mobility Bed  Mobility Overal bed mobility: Needs Assistance Bed Mobility: Sidelying to Sit, Rolling Rolling: Min guard Sidelying to sit: Mod assist, HOB elevated       General bed mobility comments: Assist for trunk management    Transfers Overall transfer level: Needs assistance Equipment used: Rolling walker (2 wheels) Transfers: Sit to/from Stand Sit to Stand: Mod assist, From elevated surface     Step pivot transfers: Min assist     General transfer comment: Pt mildly SOB - reports SOB was the limiting factor vs back pain vs joint pain     Balance Overall balance assessment: Needs assistance Sitting-balance support: Bilateral upper extremity supported, Feet supported Sitting balance-Leahy Scale: Fair Sitting balance - Comments: Bilateral upper support required to maintain posture and to facilitate deep breaths Postural control: Posterior lean Standing balance support: Bilateral upper extremity supported, During functional activity, Reliant on assistive device for balance Standing balance-Leahy Scale: Poor Standing balance comment: Reliant on RW for balance                           ADL either performed or assessed with clinical judgement   ADL Overall ADL's : Needs assistance/impaired                 Upper Body Dressing : Maximal assistance;Sitting Upper Body Dressing Details (indicate cue type and reason): Donn brace                 Functional mobility during ADLs: Moderate assistance;Rolling walker (2 wheels) General ADL Comments: Pt currently requires MOD A for LB ADL tasks.    Extremity/Trunk Assessment Upper Extremity Assessment Upper Extremity Assessment: Overall WFL for tasks assessed   Lower Extremity Assessment Lower Extremity Assessment: Defer  to PT evaluation        Vision       Perception     Praxis      Cognition Arousal/Alertness: Awake/alert Behavior During Therapy: WFL for tasks assessed/performed Overall Cognitive  Status: Within Functional Limits for tasks assessed                                 General Comments: Expressed concern for beathing vs panic attack episodes        Exercises      Shoulder Instructions       General Comments      Pertinent Vitals/ Pain       Pain Assessment Pain Assessment: Faces Faces Pain Scale: Hurts little more Pain Location: Mid Back Pain Descriptors / Indicators: Discomfort, Grimacing Pain Intervention(s): Monitored during session, Other (comment) (Brace applied)  Home Living                                          Prior Functioning/Environment              Frequency  Min 1X/week        Progress Toward Goals  OT Goals(current goals can now be found in the care plan section)  Progress towards OT goals: Progressing toward goals  Acute Rehab OT Goals Patient Stated Goal: To go home OT Goal Formulation: With patient Time For Goal Achievement: 03/03/23 Potential to Achieve Goals: Good ADL Goals Pt Will Transfer to Toilet: with modified independence;ambulating Pt Will Perform Toileting - Clothing Manipulation and hygiene: with modified independence Additional ADL Goal #1: Pt will utilize PLB throughout exertional activity with PRN VC in order to minimize SOB. Additional ADL Goal #2: pt will verbalize plan to implement at least 2 learned ECS into daily ADL/IADL routine.  Plan Discharge plan remains appropriate    Co-evaluation                 AM-PAC OT "6 Clicks" Daily Activity     Outcome Measure   Help from another person eating meals?: None Help from another person taking care of personal grooming?: A Little Help from another person toileting, which includes using toliet, bedpan, or urinal?: A Lot Help from another person bathing (including washing, rinsing, drying)?: A Lot Help from another person to put on and taking off regular upper body clothing?: A Little Help from another person to  put on and taking off regular lower body clothing?: A Lot 6 Click Score: 16    End of Session Equipment Utilized During Treatment: Rolling walker (2 wheels);Oxygen;Other (comment) (TLSO)  OT Visit Diagnosis: Other abnormalities of gait and mobility (R26.89);Muscle weakness (generalized) (M62.81)   Activity Tolerance Patient limited by fatigue;Other (comment)   Patient Left in bed;Other (comment) (With PT)   Nurse Communication          Time: 1610-9604 OT Time Calculation (min): 23 min  Charges: OT General Charges $OT Visit: 1 Visit OT Treatments $Self Care/Home Management : 8-22 mins  Thresa Ross, OTS  02/21/2023, 3:22 PM

## 2023-02-21 NOTE — Progress Notes (Signed)
Physical Therapy Treatment Patient Details Name: ZAYIN VALADEZ MRN: [id number] DOB: 01-Aug-1956 Today's Date: 02/21/2023   History of Present Illness This is a 67 y.o. male past medical history significant for peripheral arterial disease, CAD, CHF, atrial fibrillation, COPD. Admitted due to SOB, cellulitis of LE, and a venous ulcer.  T11 and L3 compression facture with back pain and TLSO recommended for comfort by neurosurgery.    PT Comments  Patient was seated on edge of bed with OT present. TLSO in place. The patient continues to be limited by dyspnea with exertion, anxiety, and back pain, although he did report he felt the brace helps with the back pain. He still requires physical assistance with bed mobility and transfers. The patient ambulated a short distance with the walker with activity tolerance limited by fatigue.  Patient reports he has 9 stairs to enter his home with difficulty at baseline (rarely leaves the house becuase of this). Generalized weakness, dyspnea, and anxiety limit the ability to safely attempt the stairs at this time. Consider non emergency medical transportation if patient is going home. Recommend to continue PT to maximize independence and decrease caregiver burden.     Assistance Recommended at Discharge Intermittent Supervision/Assistance  If plan is discharge home, recommend the following:  Can travel by private vehicle    A lot of help with walking and/or transfers;A lot of help with bathing/dressing/bathroom;Assistance with cooking/housework;Assist for transportation;Help with stairs or ramp for entrance   No  Equipment Recommendations  None recommended by PT    Recommendations for Other Services       Precautions / Restrictions Precautions Precautions: Fall Precaution Comments: back pain Required Braces or Orthoses: Spinal Brace Spinal Brace: Thoracolumbosacral orthotic;Applied in sitting position Restrictions Weight Bearing Restrictions: No      Mobility  Bed Mobility Overal bed mobility: Needs Assistance Bed Mobility: Sidelying to Sit, Rolling, Sit to Supine, Sit to Sidelying       Sit to supine: Mod assist   General bed mobility comments: logroll technique used for comfort.    Transfers Overall transfer level: Needs assistance Equipment used: Rolling walker (2 wheels) Transfers: Sit to/from Stand Sit to Stand: Mod assist, From elevated surface           General transfer comment: verbal cues for technique for safety.    Ambulation/Gait Ambulation/Gait assistance: Min assist Gait Distance (Feet): 5 Feet Assistive device: Rolling walker (2 wheels) Gait Pattern/deviations: Trunk flexed, Step-to pattern, Decreased step length - left, Decreased step length - right Gait velocity: decreased     General Gait Details: standing activity tolerance limited by dyspnea with exertion and anxiety. patient reports the brace does seem to help with the back pain   Stairs         General stair comments: patient reports he has 9 stairs to enter his home with difficulty at baseline (rarely leaves the house becuase of this). generalized weakness, dyspnea, and anxiety limit the ability to safely attempt the stairs at this time. consider non emergency medical transportation if patient is going home.   Wheelchair Mobility     Tilt Bed    Modified Rankin (Stroke Patients Only)       Balance Overall balance assessment: Needs assistance Sitting-balance support: Bilateral upper extremity supported, Feet supported Sitting balance-Leahy Scale: Fair Sitting balance - Comments: Bilateral upper support required to maintain posture and to facilitate deep breaths   Standing balance support: Bilateral upper extremity supported, During functional activity, Reliant on assistive device for balance Standing  balance-Leahy Scale: Poor Standing balance comment: heavy reliance on rolling walker for support                             Cognition Arousal/Alertness: Awake/alert Behavior During Therapy: Anxious Overall Cognitive Status: Within Functional Limits for tasks assessed                                          Exercises      General Comments General comments (skin integrity, edema, etc.): patient seated on edge of bed with OT present on arrival to room. TLSO was in place with mobility and removed before returning to bed      Pertinent Vitals/Pain Pain Assessment Pain Assessment: Faces Faces Pain Scale: Hurts little more Pain Location: Mid Back Pain Descriptors / Indicators: Discomfort, Grimacing Pain Intervention(s): Limited activity within patient's tolerance, Monitored during session, Repositioned    Home Living                          Prior Function            PT Goals (current goals can now be found in the care plan section) Acute Rehab PT Goals Patient Stated Goal: to get better PT Goal Formulation: With patient Time For Goal Achievement: 03/03/23 Potential to Achieve Goals: Poor Progress towards PT goals: Progressing toward goals    Frequency    Min 1X/week      PT Plan Current plan remains appropriate    Co-evaluation              AM-PAC PT "6 Clicks" Mobility   Outcome Measure  Help needed turning from your back to your side while in a flat bed without using bedrails?: Total Help needed moving from lying on your back to sitting on the side of a flat bed without using bedrails?: Total Help needed moving to and from a bed to a chair (including a wheelchair)?: A Lot Help needed standing up from a chair using your arms (e.g., wheelchair or bedside chair)?: A Lot Help needed to walk in hospital room?: A Lot Help needed climbing 3-5 steps with a railing? : Total 6 Click Score: 9    End of Session Equipment Utilized During Treatment: Oxygen Activity Tolerance: Patient limited by pain;Patient limited by fatigue (limited by  anxiety) Patient left: in bed;with call bell/phone within reach;with bed alarm set;with SCD's reapplied   PT Visit Diagnosis: Unsteadiness on feet (R26.81);Muscle weakness (generalized) (M62.81);Pain;History of falling (Z91.81);Difficulty in walking, not elsewhere classified (R26.2)     Time: 1425-1500 PT Time Calculation (min) (ACUTE ONLY): 35 min  Charges:    $Therapeutic Activity: 23-37 mins PT General Charges $$ ACUTE PT VISIT: 1 Visit                     Donna Bernard, PT, MPT    Ina Homes 02/21/2023, 3:36 PM

## 2023-02-21 NOTE — Progress Notes (Signed)
Orthopedic Tech Progress Note Patient Details:  Erik [name] Dec 18, 1955 [id number]  Called in order to [facility] for TLSO   Patient ID: [name], male   DOB: 04-18-1956, 67 y.o.   MRN: [id number]  [name] 02/21/2023, 11:03 AM

## 2023-02-21 NOTE — Plan of Care (Signed)

## 2023-02-21 NOTE — Progress Notes (Signed)
Progress Note   Patient: Erik [name] QIO:[id number] DOB: 04-05-1956 DOA: 02/16/2023     5 DOS: the patient was seen and examined on 02/21/2023   Brief hospital course: KARMELO BASS is a 67 y.o. male with medical history significant of paroxysmal atrial fibrillation not on anticoagulation, chronic systolic congestive heart failure with ejection fraction 35 to 40%, moderate pulm hypertension, COPD, chronic hypoxemia on 4 L oxygen, coronary disease, essential hypertension, who present to the hospital with complaints of worsening wounds in the right leg.  Patient is treated with vancomycin and Rocephin for cellulitis and wound infection. Patient was also treated with IV steroids for COPD exacerbation.  CT chest ruled out PE. Condition had improved, antibiotic changed to oral.  Steroids dose tapering down.  Patient also has compressive fracture in T11 and L3, does not appear to be acute.  Neurosurgery consult obtained due to persistent pain.   Principal Problem:   Cellulitis and abscess of right leg Active Problems:   Venous ulcer (HCC)   COPD exacerbation (HCC)   PAD (peripheral artery disease) (HCC)   Paroxysmal atrial fibrillation with RVR (HCC)   Chronic systolic CHF (congestive heart failure) (HCC)   Obesity (BMI 30-39.9)   Chronic respiratory failure with hypoxia (HCC)   Sepsis (HCC)   Moderate pulmonary hypertension (HCC)   Compression fracture of T11 vertebra (HCC)   Multiple lung nodules   Dysphagia   Closed compression fracture of L3 vertebra (HCC)   Assessment and Plan: Sepsis secondary to right leg cellulitis. Right lower extremity cellulitis with wound infection. Peripheral arterial disease. Patient met sepsis criteria with tachycardia, tachypnea, leukocytosis.  Lactic acid level mildly elevated.  The source is right leg infection.   started antibiotics with Rocephin and vancomycin. Patient was seen by vascular surgery, no need for additional intervention.  Seen by  podiatry, no need for debridement. Condition improved, changed antibiotic to doxycycline and Rocephin.  Discontinued vancomycin. Will continue antibiotics with doxycycline and Keflex for additional 3 days.   Dysphagia. Seen by speech therapy, no evidence of aspiration.   COPD exacerbation. Chronic hypoxemic respiratory failure. Patient has some bronchospasm, will start IV steroids.  Continue bronchodilator.  Continue oxygen treatment. Patient has significant elevation of D-dimer, CT angiogram ruled out PE. 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  PT/OT recommended nursing home placement.  Patient has refused. Wean down steroids to 20 mg daily for additional 3 days.  T11 compression fracture. L3 compression fracture. This does not appear to be acute, but the patient has significant pain from the back.  He states that he could not tolerate oxycodone due to allergy.  Added tramadol. TLSO ordered. Discussed with Dr. Myer Haff, due to patient severe comorbidity, patient is not a surgical candidate.  Recommended TLSO and symptomatic treatment. Patient has not doing well with mobility due to pain and shortness of breath, but refused nursing home placement.    Lung nodules. Inflammatory versus true nodules.  Need to follow-up with repeat imaging in 3 months.   Paroxysmal atrial fibrillation with RVR. Patient had a significant tachycardia appears to be secondary to sepsis.  I will continue metoprolol, also added diltiazem. Patient was not chronically on anticoagulation.   Heart rate is better controlled today.   Chronic systolic congestive heart failure.   Moderate pulmonary hypertension. Patient does not have exacerbation of congestive heart failure.  BNP only mildly elevated.  Restarted home dose beta-blocker, restarted torsemide at 40 mg twice a day.  Essential hypertension. Coronary artery disease. Continue home medicines.             Subjective:  Patient still complaining of back pain, shortness of breath with minimal exertion.  Physical Exam: Vitals:   02/20/23 0723 02/20/23 1531 02/20/23 2222 02/21/23 0717  BP: 113/64 123/84 134/89   Pulse: (!) 55 73 91 88  Resp: 14 18 18 20   Temp: 98.6 F (37 C) 98.8 F (37.1 C) 97.8 F (36.6 C)   TempSrc:      SpO2: 93% 96% 98% 96%  Weight:      Height:       General exam: Appears calm and comfortable  Respiratory system: Decreased breath sounds. Respiratory effort normal. Cardiovascular system: S1 & S2 heard, RRR. No JVD, murmurs, rubs, gallops or clicks. No pedal edema. Gastrointestinal system: Abdomen is nondistended, soft and nontender. No organomegaly or masses felt. Normal bowel sounds heard. Central nervous system: Alert and oriented. No focal neurological deficits. Extremities: Symmetric 5 x 5 power. Skin: No rashes, lesions or ulcers Psychiatry: Mood & affect appropriate.    Data Reviewed:  There are no new results to review at this time.  Family Communication: None  Disposition: Status is: Inpatient Remains inpatient appropriate because: Severity of illness     Time spent: 35 minutes  Author: Marrion Coy, MD 02/21/2023 8:28 AM  For on call review [email or web address].

## 2023-02-22 ENCOUNTER — Inpatient Hospital Stay: Payer: HMO

## 2023-02-22 DIAGNOSIS — I739 Peripheral vascular disease, unspecified: Secondary | ICD-10-CM | POA: Diagnosis not present

## 2023-02-22 DIAGNOSIS — M549 Dorsalgia, unspecified: Secondary | ICD-10-CM | POA: Diagnosis not present

## 2023-02-22 DIAGNOSIS — L97909 Non-pressure chronic ulcer of unspecified part of unspecified lower leg with unspecified severity: Secondary | ICD-10-CM

## 2023-02-22 DIAGNOSIS — I83009 Varicose veins of unspecified lower extremity with ulcer of unspecified site: Secondary | ICD-10-CM

## 2023-02-22 DIAGNOSIS — J441 Chronic obstructive pulmonary disease with (acute) exacerbation: Secondary | ICD-10-CM | POA: Diagnosis not present

## 2023-02-22 DIAGNOSIS — S32030S Wedge compression fracture of third lumbar vertebra, sequela: Secondary | ICD-10-CM

## 2023-02-22 DIAGNOSIS — R918 Other nonspecific abnormal finding of lung field: Secondary | ICD-10-CM

## 2023-02-22 DIAGNOSIS — I272 Pulmonary hypertension, unspecified: Secondary | ICD-10-CM

## 2023-02-22 DIAGNOSIS — E669 Obesity, unspecified: Secondary | ICD-10-CM

## 2023-02-22 MED ORDER — LORAZEPAM 2 MG/ML IJ SOLN
0.5000 mg | Freq: Once | INTRAMUSCULAR | Status: AC | PRN
  Administered 2023-02-22: 0.5 mg via INTRAVENOUS
  Filled 2023-02-22: qty 1

## 2023-02-22 MED ORDER — SPIRONOLACTONE 12.5 MG HALF TABLET
12.5000 mg | ORAL_TABLET | Freq: Every day | ORAL | Status: DC
  Administered 2023-02-22 – 2023-02-27 (×6): 12.5 mg via ORAL
  Filled 2023-02-22 (×8): qty 1

## 2023-02-22 MED ORDER — DAPAGLIFLOZIN PROPANEDIOL 10 MG PO TABS
10.0000 mg | ORAL_TABLET | Freq: Every day | ORAL | Status: DC
  Administered 2023-02-23 – 2023-03-04 (×9): 10 mg via ORAL
  Filled 2023-02-22 (×10): qty 1

## 2023-02-22 MED ORDER — FENTANYL CITRATE PF 50 MCG/ML IJ SOSY
25.0000 ug | PREFILLED_SYRINGE | Freq: Once | INTRAMUSCULAR | Status: AC
  Administered 2023-02-22: 25 ug via INTRAVENOUS
  Filled 2023-02-22: qty 1

## 2023-02-22 NOTE — Assessment & Plan Note (Signed)
Recommend repeating a CT scan in a few months to ensure clearing.

## 2023-02-22 NOTE — Assessment & Plan Note (Addendum)
T11, L3 and L5 compression fractures.  Pain control.  Physical therapy recommending rehab.  3 level kyphoplasty today.  Heparin drip will stopped at 9 AM.  Will restart aspirin and Plavix tomorrow morning.

## 2023-02-22 NOTE — Assessment & Plan Note (Addendum)
Chronic respiratory failure on oxygen 2 L.  Will complete prednisone today.  Continue inhalers and nebulizers.  Breathing better since he coughed up some phlegm.  Currently on 4 L but have room to taper down to 2 L.  Family requested COVID test which was negative.  Viral respiratory panel pending.

## 2023-02-22 NOTE — Plan of Care (Signed)

## 2023-02-22 NOTE — Care Management Important Message (Signed)
Important Message  Patient Details  Name: Erik [name] MRN: [id number] Date of Birth: 03-04-1956   Medicare Important Message Given:  Yes     Olegario Messier A Kamy Poinsett 02/22/2023, 11:23 AM

## 2023-02-22 NOTE — TOC Progression Note (Addendum)
Transition of Care [facility]) - Progression Note    Patient Details  Name: Erik [name] MRN: [id number] Date of Birth: 1956-05-16  Transition of [facility] CM/SW Contact  Marlowe Sax, RN Phone Number: 02/22/2023, 9:36 AM  Clinical Narrative:     Sable Feil with remote health and she stated that they do not go to [facility] area, she is not aware of any resource for [place] but do take his [employer] making house calls  [address] [place], [place] [address] Hours:  Open ? Closes 6:30?PM Phone: [phone]  To inquire if they go to [facility] and accept his Insurance, they are no longer Doctors making house calls and they do not go to homes to see patient's any longer  Spoke to the patient and explained that I was not able to get [facility] or a doctor to see him at home, I explained that the home health agencies will not accept him due to not keeping his doctors appointments, he stated understanding and that his wife can manage his wound at home, He has a rollator delivered to the room by [employer] to take home with him, he has a BSC at home and a RW as well as Standard walker at home, he stated that he ordered the [facility] thru his insurance booklet and doe snot like how it fits, I explained that Insurance will only cover one every 5 years, he would have to purchase out of pocket if he wanted a different one before  that 5 years is up, he stated understanding, I asked him about transportation and he stated that if he was able to manage the stairs with PT today then he would go home in private vehicle, if he is not able to manage stairs will need EMS home  Expected Discharge Plan: Home w Home Health Services Barriers to Discharge: No Barriers Identified  Expected Discharge Plan and Services   Discharge Planning Services: CM Consult   Living arrangements for the past 2 months: Single Family Home                 DME Arranged: Walker rolling with seat DME Agency: [employer] Date DME  Agency Contacted: 02/17/23 Time DME Agency Contacted: [phone] Representative spoke with at DME Agency: [facility] HH Arranged: RN, PT [facility] Agency: [facility] Date [facility] Agency Contacted: 02/20/23 Time HH Agency Contacted: [phone] Representative spoke with at [facility] Agency: Coralee [facility]   Social Determinants of Health (SDOH) Interventions SDOH Screenings   Food Insecurity: No Food Insecurity (02/16/2023)  Housing: Low Risk  (02/16/2023)  Transportation Needs: No Transportation Needs (02/16/2023)  Utilities: Not At Risk (02/16/2023)  Alcohol Screen: Low Risk  (02/19/2020)  Depression (PHQ2-9): Low Risk  (08/25/2020)  Financial Resource Strain: Low Risk  (11/04/2020)  Tobacco Use: Medium Risk (02/16/2023)    Readmission Risk Interventions     No data to display

## 2023-02-22 NOTE — Assessment & Plan Note (Addendum)
Patient on metoprolol.  Went into rapid atrial fibrillation after procedure.  Required a few doses of IV meds.  Asked nursing staff to give the oral metoprolol.

## 2023-02-22 NOTE — Assessment & Plan Note (Signed)
BMI 31.85

## 2023-02-22 NOTE — Assessment & Plan Note (Addendum)
Baseline 2 L nasal cannula.

## 2023-02-22 NOTE — Progress Notes (Signed)
Physical Therapy Treatment Patient Details Name: Erik [name] MRN: [id number] DOB: 06-06-1956 Today's Date: 02/22/2023   History of Present Illness This is a 67 y.o. male past medical history significant for peripheral arterial disease, CAD, CHF, atrial fibrillation, COPD. Admitted due to SOB, cellulitis of LE, and a venous ulcer.  T11 and L3 compression facture with back pain and TLSO recommended for comfort by neurosurgery.    PT Comments  Patient continues to be limited by pain, anxiety, dyspnea with exertion. He continues to require significant assistance with all mobility and is only able to stand once and take a few side steps using the rolling walker with difficulty. TLSO in place with no improvement in back pain reported. Frequent rest breaks required. Standing tolerance limited for progression of walking or attempting stairs at this time. Anticipate the need for frequent physical assistance required at discharge with ongoing PT recommended.     Assistance Recommended at Discharge Frequent or constant Supervision/Assistance  If plan is discharge home, recommend the following:  Can travel by private vehicle    A lot of help with walking and/or transfers;A lot of help with bathing/dressing/bathroom;Help with stairs or ramp for entrance;Assist for transportation;Assistance with cooking/housework   No  Equipment Recommendations  None recommended by PT    Recommendations for Other Services       Precautions / Restrictions Precautions Precautions: Fall Restrictions Weight Bearing Restrictions: No     Mobility  Bed Mobility Overal bed mobility: Needs Assistance Bed Mobility: Rolling, Sidelying to Sit, Sit to Sidelying Rolling: Mod assist Sidelying to sit: Mod assist     Sit to sidelying: Mod assist, +2 for physical assistance General bed mobility comments: verbal cues for logroll technique. increased assistance required today compared to yesterday. patient appears to have  increased pain today and more anxiety compared to last session as well    Transfers Overall transfer level: Needs assistance Equipment used: Rolling walker (2 wheels) Transfers: Sit to/from Stand Sit to Stand: Mod assist, From elevated surface           General transfer comment: TLSO donned in sitting prior to standing activity with maximal assistance from therapist. increased anxiety around standing attempts. patient unable to stand with the first attempt but does stand with the second attempt. cues for safety    Ambulation/Gait Ambulation/Gait assistance: Min assist Gait Distance (Feet): 2 Feet Assistive device: Rolling walker (2 wheels) Gait Pattern/deviations: Trunk flexed Gait velocity: decreased     General Gait Details: patient able to take several side steps to the right with difficulty, limited by pain (back). further standing activity deferred. joint crepitus is noted with dynamic activity   Stairs         General stair comments: unable to attempt at this time.   Wheelchair Mobility     Tilt Bed    Modified Rankin (Stroke Patients Only)       Balance Overall balance assessment: Needs assistance Sitting-balance support: Bilateral upper extremity supported, Feet supported Sitting balance-Leahy Scale: Fair     Standing balance support: Bilateral upper extremity supported, During functional activity, Reliant on assistive device for balance Standing balance-Leahy Scale: Poor Standing balance comment: heavy reliance on rolling walker for support                            Cognition Arousal/Alertness: Awake/alert Behavior During Therapy: Anxious Overall Cognitive Status: Within Functional Limits for tasks assessed  Exercises      General Comments General comments (skin integrity, edema, etc.): TLSO was left on per nursing requests as patient is pending more imaging with the brace  in place      Pertinent Vitals/Pain Pain Assessment Pain Assessment: Faces Faces Pain Scale: Hurts whole lot Pain Location: Mid Back Pain Descriptors / Indicators: Discomfort, Grimacing Pain Intervention(s): Limited activity within patient's tolerance, Monitored during session, Repositioned    Home Living                          Prior Function            PT Goals (current goals can now be found in the care plan section) Acute Rehab PT Goals Patient Stated Goal: to get better PT Goal Formulation: With patient Time For Goal Achievement: 03/03/23 Potential to Achieve Goals: Poor Progress towards PT goals: Progressing toward goals    Frequency    Min 1X/week      PT Plan Current plan remains appropriate    Co-evaluation              AM-PAC PT "6 Clicks" Mobility   Outcome Measure  Help needed turning from your back to your side while in a flat bed without using bedrails?: Total Help needed moving from lying on your back to sitting on the side of a flat bed without using bedrails?: Total Help needed moving to and from a bed to a chair (including a wheelchair)?: A Lot Help needed standing up from a chair using your arms (e.g., wheelchair or bedside chair)?: A Lot Help needed to walk in hospital room?: A Lot Help needed climbing 3-5 steps with a railing? : Total 6 Click Score: 9    End of Session Equipment Utilized During Treatment: Oxygen Activity Tolerance: Patient limited by pain;Patient limited by fatigue (anxiety) Patient left: in bed;with call bell/phone within reach;with bed alarm set Nurse Communication: Mobility status PT Visit Diagnosis: Unsteadiness on feet (R26.81);Muscle weakness (generalized) (M62.81);Pain;History of falling (Z91.81);Difficulty in walking, not elsewhere classified (R26.2)     Time: 1610-9604 PT Time Calculation (min) (ACUTE ONLY): 26 min  Charges:    $Therapeutic Activity: 23-37 mins PT General Charges $$ ACUTE  PT VISIT: 1 Visit                     Erik [name], PT, MPT    Erik [name] 02/22/2023, 11:21 AM

## 2023-02-22 NOTE — Assessment & Plan Note (Addendum)
Holding aspirin and Plavix for kyphoplasty.

## 2023-02-22 NOTE — Progress Notes (Signed)
Progress Note   Patient: Erik [name] VHQ:[id number] DOB: 05-Sep-1955 DOA: 02/16/2023     6 DOS: the patient was seen and examined on 02/22/2023   Brief hospital course: Erik [name] is a 67 y.o. male with medical history significant of paroxysmal atrial fibrillation not on anticoagulation, chronic systolic congestive heart failure with ejection fraction 35 to 40%, moderate pulm hypertension, COPD, chronic hypoxemia on 4 L oxygen, coronary disease, essential hypertension, who present to the hospital with complaints of worsening wounds in the right leg.  Patient is treated with vancomycin and Rocephin for cellulitis and wound infection. Patient was also treated with IV steroids for COPD exacerbation.  CT chest ruled out PE. Condition had improved, antibiotic changed to oral.  Steroids dose tapering down.  Patient also has compressive fracture in T11 and L3, does not appear to be acute.  Neurosurgery consult obtained due to persistent pain.  7/17.  Patient having a lot of pain still and having difficulty breathing secondary to the pain.  MRI of the thoracic and lumbar spine ordered.  Assessment and Plan: * Severe back pain Will get an MRI of the thoracic and lumbar spine for further evaluation.  Had compression fracture seen on L3 and T11 on x-rays.  Pain control.  Sepsis (HCC) Present on admission with right lower extremity cellulitis.  Patient had tachycardia tachypnea and leukocytosis and mildly elevated lactic acid.  Continue antibiotics.  Now on Keflex and doxycycline.  Venous ulcer (HCC) Will need follow-up at the wound care center.  COPD exacerbation (HCC) Chronic respiratory failure on oxygen 2 L.  Now on prednisone.  Continue inhalers and nebulizers.  PAD (peripheral artery disease) (HCC) Patient on aspirin and Plavix  Paroxysmal atrial fibrillation with RVR (HCC) Patient on metoprolol.  Not on major anticoagulation.  Chronic systolic CHF (congestive heart failure)  (HCC) Last EF 35% patient on metoprolol and Demadex.  Will add Farxiga and low-dose spironolactone.  Obesity (BMI 30-39.9) BMI 31.85  Multiple lung nodules Recommend repeating a CT scan in a few months to ensure clearing.  Chronic respiratory failure with hypoxia (HCC) Currently on 4 L        Subjective: Patient in a lot of back pain.  Hardly able to move.  He states his breathing is worse secondary to not being able to move.  Physical Exam: Vitals:   02/21/23 2141 02/22/23 0035 02/22/23 0753 02/22/23 1545  BP: 130/89 126/73 (!) 124/95 115/87  Pulse: 94 67 77 82  Resp:  20 16 18   Temp:  98.2 F (36.8 C)  97.9 F (36.6 C)  TempSrc:      SpO2:  98% 96% 94%  Weight:      Height:       Physical Exam HENT:     Head: Normocephalic.     Mouth/Throat:     Pharynx: No oropharyngeal exudate.  Eyes:     General: Lids are normal.     Conjunctiva/sclera: Conjunctivae normal.  Cardiovascular:     Rate and Rhythm: Normal rate and regular rhythm.     Heart sounds: Normal heart sounds, S1 normal and S2 normal.  Pulmonary:     Breath sounds: Examination of the right-lower field reveals decreased breath sounds. Examination of the left-lower field reveals decreased breath sounds. Decreased breath sounds present. No wheezing, rhonchi or rales.  Abdominal:     Palpations: Abdomen is soft.     Tenderness: There is no abdominal tenderness.  Musculoskeletal:     Right lower leg:  Swelling present.     Left lower leg: Swelling present.  Skin:    General: Skin is warm.     Comments: Right leg covered.  Neurological:     Mental Status: He is alert and oriented to person, place, and time.     Data Reviewed: Creatinine 0.95, platelet count 417, white blood cell count 10.1, hemoglobin 13.3 MRI showing L3 and L5 compression fractures.  Family Communication: Updated wife on the phone  Disposition: Status is: Inpatient Remains inpatient appropriate because: Still having a lot of back  pain.  Planned Discharge Destination: To be determined    Time spent: 28 minutes Will review MRIs with neurosurgical team.  Author: Alford Highland, MD 02/22/2023 4:05 PM  For on call review [email or web address].

## 2023-02-22 NOTE — Assessment & Plan Note (Addendum)
Present on admission with right lower extremity cellulitis.  Patient had tachycardia tachypnea and leukocytosis and mildly elevated lactic acid.  Continue antibiotics.  Now on doxycycline.

## 2023-02-22 NOTE — Assessment & Plan Note (Signed)
 Will need follow-up at the wound care center.

## 2023-02-22 NOTE — Assessment & Plan Note (Addendum)
Last EF 35% patient on Toprol, Demadex, Farxiga and spironolactone.

## 2023-02-23 ENCOUNTER — Other Ambulatory Visit (HOSPITAL_COMMUNITY): Payer: Self-pay

## 2023-02-23 ENCOUNTER — Encounter: Payer: Self-pay | Admitting: Internal Medicine

## 2023-02-23 ENCOUNTER — Inpatient Hospital Stay: Payer: HMO

## 2023-02-23 DIAGNOSIS — S32050S Wedge compression fracture of fifth lumbar vertebra, sequela: Secondary | ICD-10-CM

## 2023-02-23 DIAGNOSIS — S22080S Wedge compression fracture of T11-T12 vertebra, sequela: Secondary | ICD-10-CM | POA: Diagnosis not present

## 2023-02-23 DIAGNOSIS — M549 Dorsalgia, unspecified: Secondary | ICD-10-CM | POA: Diagnosis not present

## 2023-02-23 DIAGNOSIS — S32030S Wedge compression fracture of third lumbar vertebra, sequela: Secondary | ICD-10-CM | POA: Diagnosis not present

## 2023-02-23 MED ORDER — GUAIFENESIN-DM 100-10 MG/5ML PO SYRP
5.0000 mL | ORAL_SOLUTION | ORAL | Status: DC | PRN
  Administered 2023-02-23 (×2): 5 mL via ORAL
  Filled 2023-02-23 (×2): qty 10

## 2023-02-23 MED ORDER — PREDNISONE 10 MG PO TABS
10.0000 mg | ORAL_TABLET | Freq: Every day | ORAL | Status: DC
  Administered 2023-02-24 – 2023-02-25 (×2): 10 mg via ORAL
  Filled 2023-02-23 (×2): qty 1

## 2023-02-23 MED ORDER — FENTANYL CITRATE PF 50 MCG/ML IJ SOSY
12.5000 ug | PREFILLED_SYRINGE | INTRAMUSCULAR | Status: DC | PRN
  Administered 2023-02-23: 12.5 ug via INTRAVENOUS
  Filled 2023-02-23: qty 1

## 2023-02-23 NOTE — Plan of Care (Signed)
  Problem: Fluid Volume: Goal: Hemodynamic stability will improve Outcome: Progressing   Problem: Respiratory: Goal: Ability to maintain adequate ventilation will improve Outcome: Progressing   Problem: Activity: Goal: Risk for activity intolerance will decrease Outcome: Progressing   Problem: Pain Managment: Goal: General experience of comfort will improve Outcome: Progressing   Problem: Safety: Goal: Ability to remain free from injury will improve Outcome: Progressing   Problem: Skin Integrity: Goal: Risk for impaired skin integrity will decrease Outcome: Progressing

## 2023-02-23 NOTE — TOC Progression Note (Signed)
Transition of Care [facility]) - Progression Note    Patient Details  Name: Erik [name] MRN: [id number] Date of Birth: 1955-08-17  Transition of Care [facility] CM/SW Contact  Marlowe Sax, RN Phone Number: 02/23/2023, 11:50 AM  Clinical Narrative:    Met with the patient and attempted to encourage him to go to STR prior to going home, I explained with his wife having health issues of her own I did not fee that she would be able to assist him at the level that he needs, He stated that she said that she could not manage last time but then once at home she did manage helping him. I asked him if he really felt that his wife would be able to physically help him at home when a Grown Man here at the hospital required a lot of strength to help him, he stated that he did not want to go to STR and that he would be able to manage better at home then he is able to here because at home he knows where all of his stuff is and here, there are small rooms that he has a hard time maneuvering in,  he stated he will go home and his wife will help him.ibuprofen explained that I am strongly encouraging him to go to [facility], I explained I can not force him to, I also told him I can not force his wife to care for him especially if she is not physically able, He agreed to think about it but stated that he will not likely be agreeable to go to STR.  I asked if he would consider going to CIR if he was found appropriate for that level of care, he asked me where it was located, I explained in [place], he said "no, it is too far"   Expected Discharge Plan: Home w Home Health Services Barriers to Discharge: No Barriers Identified  Expected Discharge Plan and Services   Discharge Planning Services: CM Consult   Living arrangements for the past 2 months: Single Family Home                 DME Arranged: Walker rolling with seat DME Agency: [employer] Date DME Agency Contacted: 02/17/23 Time DME Agency Contacted:  [phone] Representative spoke with at DME Agency: [facility] HH Arranged: RN, PT HH Agency: [facility] Date [facility] Agency Contacted: 02/20/23 Time HH Agency Contacted: [phone] Representative spoke with at [facility] Agency: Coralee North   Social Determinants of Health (SDOH) Interventions SDOH Screenings   Food Insecurity: No Food Insecurity (02/16/2023)  Housing: Low Risk  (02/16/2023)  Transportation Needs: No Transportation Needs (02/16/2023)  Utilities: Not At Risk (02/16/2023)  Alcohol Screen: Low Risk  (02/19/2020)  Depression (PHQ2-9): Low Risk  (08/25/2020)  Financial Resource Strain: Low Risk  (11/04/2020)  Tobacco Use: Medium Risk (02/16/2023)    Readmission Risk Interventions     No data to display

## 2023-02-23 NOTE — Plan of Care (Signed)
  Problem: Fluid Volume: Goal: Hemodynamic stability will improve Outcome: Progressing   Problem: Respiratory: Goal: Ability to maintain adequate ventilation will improve Outcome: Progressing   Problem: Clinical Measurements: Goal: Will remain free from infection Outcome: Progressing Goal: Cardiovascular complication will be avoided Outcome: Progressing   Problem: Nutrition: Goal: Adequate nutrition will be maintained Outcome: Progressing   Problem: Elimination: Goal: Will not experience complications related to bowel motility Outcome: Progressing Goal: Will not experience complications related to urinary retention Outcome: Progressing   Problem: Safety: Goal: Ability to remain free from injury will improve Outcome: Progressing

## 2023-02-23 NOTE — Consult Note (Addendum)
Chief Complaint: Patient was seen in consultation today for uncontrolled back pain with acute/subacute compression fractures at the request of Alford Highland, MD   Referring Physician(s): Alford Highland, MD   Supervising Physician: Simonne Come  Patient Status: [facility] - In-pt  History of Present Illness: Erik [name] is a 67 y.o. male with PMHx of paroxysmal atrial fibrillation not on anticoagulation, chronic systolic CHF EF 35-40%, pulmonary HTN, COPD, chronic hypoxemia on 4L, PAD on ASA and Plavix with last dose taken being 7/17, and HTN. He was admitted 7/11 with complaints of shortness of breath and worsening swelling and wounds in right leg, history of angioplasty in RLE and patient was not following up with wound care secondary to shortness of breath. He was treated for sepsis and cellulitis of RLE with antibiotics and COPD exacerbation. Patient has known chronic back pain and imaging revealed compression fractures well healed and new unhealed acute/subacute fractures. He states he has new back pain in addition to his chronic pain and this started after getting out of bed and when he felt a "pop" on 7/11 before he came to the hospital. He denies any radiation of pain into his legs. He denies any loss of bowel or bladder function. He has been wearing a brace at home and has been taking tylenol and ibuprofen that improves his baseline 6/10 back pain to 5/10 without significant relief, he has an allergy to oxycodone and morphine and therefore unable to take.   Past Medical History:  Diagnosis Date   Arthritis    Asthma    Atrial fibrillation (HCC)    CHF (congestive heart failure) (HCC)    COPD (chronic obstructive pulmonary disease) (HCC)    Coronary artery disease    GERD (gastroesophageal reflux disease)    Hypertension    Myocardial infarction [facility])    Shortness of breath dyspnea     Past Surgical History:  Procedure Laterality Date   ANGIOPLASTY  2009   CARDIAC  CATHETERIZATION N/A 03/18/2015   Procedure: Left Heart Cath with Coronary Angiography;  Surgeon: Lamar Blinks, MD;  Location: [facility] INVASIVE CV LAB;  Service: Cardiovascular;  Laterality: N/A;   CHOLECYSTECTOMY  2005   CORONARY ANGIOGRAM  2009   LOWER EXTREMITY ANGIOGRAPHY Right 08/31/2022   Procedure: Lower Extremity Angiography;  Surgeon: Renford Dills, MD;  Location: [facility] INVASIVE CV LAB;  Service: Cardiovascular;  Laterality: Right;    Allergies: Benadryl [diphenhydramine hcl], Morphine and codeine, and Oxycodone  Medications: Prior to Admission medications   Medication Sig Start Date End Date Taking? Authorizing Provider  acetaminophen (TYLENOL) 325 MG tablet Take 2 tablets (650 mg total) by mouth every 6 (six) hours as needed for mild pain (or Fever >/= 101). 09/07/22  Yes Hollice Espy, MD  albuterol (VENTOLIN HFA) 108 (90 Base) MCG/ACT inhaler Inhale 1 puff into the lungs every 6 (six) hours as needed for wheezing or shortness of breath. 02/17/22  Yes Lyndon Code, MD  aspirin EC 81 MG tablet Take 1 tablet (81 mg total) by mouth daily. Swallow whole. 09/08/22  Yes Hollice Espy, MD  clopidogrel (PLAVIX) 75 MG tablet Take 1 tablet (75 mg total) by mouth daily. 12/02/22  Yes Georgiana Spinner, NP  leptospermum manuka honey (MEDIHONEY) PSTE paste Apply 1 Application topically daily. 07/20/22  Yes Darlin Priestly, MD  metoprolol tartrate (LOPRESSOR) 100 MG tablet Take 1 tablet (100 mg total) by mouth 2 (two) times daily. 09/07/22  Yes Hollice Espy, MD  oxymetazoline (AFRIN) 0.05 % nasal spray Place 1 spray into both nostrils 2 (two) times daily. 09/07/22  Yes Hollice Espy, MD  polyethylene glycol (MIRALAX / GLYCOLAX) 17 g packet Take 17 g by mouth daily. 09/08/22  Yes Hollice Espy, MD  torsemide (DEMADEX) 20 MG tablet Take 2 tablets (40 mg total) by mouth 2 (two) times daily for 3 days, THEN 2 tablets (40 mg total) daily. 09/08/22 02/16/23 Yes Hollice Espy, MD  TRELEGY  ELLIPTA 100-62.5-25 MCG/ACT AEPB INHALE 1 PUFF BY MOUTH INTO LUNGS DAILY 05/25/22  Yes Abernathy, Alyssa, NP  gabapentin (NEURONTIN) 100 MG capsule Take 100 mg by mouth 3 (three) times daily. Patient not taking: Reported on 02/16/2023 10/15/22   [provider]  hydrOXYzine (ATARAX) 25 MG tablet Take 1 tablet (25 mg total) by mouth 3 (three) times daily as needed for anxiety. Patient not taking: Reported on 02/16/2023 07/19/22   Darlin Priestly, MD  ipratropium-albuterol (DUONEB) 0.5-2.5 (3) MG/3ML SOLN USE 1 VIAL IN NEBULIZER EVERY 6 HOURS Patient not taking: Reported on 02/16/2023 01/27/23   Sallyanne Kuster, NP  traMADol (ULTRAM) 50 MG tablet Take 50 mg by mouth every 6 (six) hours as needed. Patient not taking: Reported on 02/16/2023 10/12/22   [provider]     Family History  Problem Relation Age of Onset   Coronary artery disease Father    Diabetes Father    Hyperlipidemia Father    Hypertension Father    Stroke Mother    Hyperlipidemia Mother     Social History   Socioeconomic History   Marital status: Married    Spouse name: Not on file   Number of children: Not on file   Years of education: Not on file   Highest education level: Not on file  Occupational History   Not on file  Tobacco Use   Smoking status: Former    Current packs/day: 0.00    Types: Cigarettes    Start date: 03/20/1972    Quit date: 03/20/1997    Years since quitting: 25.9   Smokeless tobacco: Never  Vaping Use   Vaping status: Never Used  Substance and Sexual Activity   Alcohol use: No   Drug use: No   Sexual activity: Not on file  Other Topics Concern   Not on file  Social History Narrative   Not on file   Social Determinants of Health   Financial Resource Strain: Low Risk  (11/04/2020)   Overall Financial Resource Strain (CARDIA)    Difficulty of Paying Living Expenses: Not hard at all  Food Insecurity: No Food Insecurity (02/16/2023)   Hunger Vital Sign    Worried About Running  Out of Food in the Last Year: Never true    Ran Out of Food in the Last Year: Never true  Transportation Needs: No Transportation Needs (02/16/2023)   PRAPARE - Administrator, Civil Service (Medical): No    Lack of Transportation (Non-Medical): No  Physical Activity: Not on file  Stress: Not on file  Social Connections: Not on file    Review of Systems: A 12 point ROS discussed and pertinent positives are indicated in the HPI above.  All other systems are negative.  Review of Systems  Vital Signs: BP 133/77 (BP Location: Left Arm)   Pulse 85   Temp 98.5 F (36.9 C)   Resp 18   Ht 5\' 10"  (1.778 m)   Wt 222 lb (100.7 kg)   SpO2 95%  BMI 31.85 kg/m   Advance Care Plan: The advanced care plan/surrogate decision maker was discussed at the time of visit and documented in the medical record.   Physical Exam Constitutional:      General: He is not in acute distress. HENT:     Head: Normocephalic and atraumatic.  Cardiovascular:     Rate and Rhythm: Normal rate and regular rhythm.  Pulmonary:     Effort: Pulmonary effort is normal.     Comments: Diminished breath sounds b/l Abdominal:     Palpations: Abdomen is soft.  Musculoskeletal:     Comments: T11 region and lower lumbar L3-5 region with minimal TTP, no skin changes or breakdown noted.  Skin:    General: Skin is warm and dry.  Neurological:     Mental Status: He is alert and oriented to person, place, and time.    Imaging: DG Chest 2 View  Result Date: 02/23/2023 CLINICAL DATA:  Cough EXAM: CHEST - 2 VIEW COMPARISON:  Previous studies including the examination of 02/16/2023 FINDINGS: Transverse diameter of heart is increased. There are no signs of pulmonary edema or new focal infiltrates. There is no pleural effusion or pneumothorax. Emphysematous changes are noted in the upper lung fields, more so on the right side. IMPRESSION: Cardiomegaly. There are no signs of pulmonary edema or focal pulmonary  consolidation. Electronically Signed   By: Ernie Avena M.D.   On: 02/23/2023 08:53   DG Lumbar Spine 2-3 Views  Result Date: 02/22/2023 CLINICAL DATA:  Upright images for fracture EXAM: LUMBAR SPINE - 2-3 VIEW COMPARISON:  MRI 02/22/2023 and radiographs 02/20/2023 FINDINGS: Demineralization. Similar compression fracture of the T11, L2, L3, L4, and L5. Similar retropulsion of the superior endplate of L3. IMPRESSION: Acute and chronic compression fractures in the thoracolumbar spine similar to MRI 02/22/2023. Electronically Signed   By: Minerva Fester M.D.   On: 02/22/2023 19:10   MR LUMBAR SPINE WO CONTRAST  Result Date: 02/22/2023 CLINICAL DATA:  Thoracic compression fracture EXAM: MRI THORACIC AND LUMBAR SPINE WITHOUT CONTRAST TECHNIQUE: Multiplanar and multiecho pulse sequences of the thoracic and lumbar spine were obtained without intravenous contrast. COMPARISON:  None Available. FINDINGS: MRI THORACIC SPINE FINDINGS Alignment:  Physiologic. Vertebrae: Marrow edema in the T11 body with minimal superior endplate depression. No retropulsion. No evidence of underlying bone lesion. Cord:  Normal signal and morphology. Paraspinal and other soft tissues: Limited to no paravertebral edema at the level of fracture. Disc levels: No significant degenerative change, only minor spondylitic spurring typical for age. No neural impingement. MRI LUMBAR SPINE FINDINGS Segmentation:  Standard. Alignment:  Physiologic. Vertebrae: Compression fractures of L2-L5. Completed healing with no marrow edema at the L2 and L4 levels where there is moderate height loss. Marrow edema with fluid-filled fracture clefts the L3 and L5 bodies, following the superior endplates. Height loss measures up to 40% at L3 and 60% at L5. Mild retropulsion of the posterosuperior corners with up to moderate thecal sac narrowing at L4-5 due to the retropulsion and ligamentous thickening. Conus medullaris and cauda equina: Conus extends to the  L2 level. Conus and cauda equina appear normal. Paraspinal and other soft tissues: Negative for perispinal mass or inflammation. Disc levels: Diffusely preserved disc height and hydration. No significant facet spurring. The foramina are diffusely patent. IMPRESSION: MR THORACIC SPINE IMPRESSION Recent and un-healed T11 compression fracture with minimal height loss and no retropulsion. MR LUMBAR SPINE IMPRESSION 1. Recent and un-healed compression fractures at L3 and L5 with up to  60% height loss at L5 where there is also posterosuperior corner retropulsion contributing to moderate thecal sac narrowing. 2. Remote and healed L2 and L4 compression fractures. Electronically Signed   By: Tiburcio Pea M.D.   On: 02/22/2023 16:01   MR THORACIC SPINE WO CONTRAST  Result Date: 02/22/2023 CLINICAL DATA:  Thoracic compression fracture EXAM: MRI THORACIC AND LUMBAR SPINE WITHOUT CONTRAST TECHNIQUE: Multiplanar and multiecho pulse sequences of the thoracic and lumbar spine were obtained without intravenous contrast. COMPARISON:  None Available. FINDINGS: MRI THORACIC SPINE FINDINGS Alignment:  Physiologic. Vertebrae: Marrow edema in the T11 body with minimal superior endplate depression. No retropulsion. No evidence of underlying bone lesion. Cord:  Normal signal and morphology. Paraspinal and other soft tissues: Limited to no paravertebral edema at the level of fracture. Disc levels: No significant degenerative change, only minor spondylitic spurring typical for age. No neural impingement. MRI LUMBAR SPINE FINDINGS Segmentation:  Standard. Alignment:  Physiologic. Vertebrae: Compression fractures of L2-L5. Completed healing with no marrow edema at the L2 and L4 levels where there is moderate height loss. Marrow edema with fluid-filled fracture clefts the L3 and L5 bodies, following the superior endplates. Height loss measures up to 40% at L3 and 60% at L5. Mild retropulsion of the posterosuperior corners with up to  moderate thecal sac narrowing at L4-5 due to the retropulsion and ligamentous thickening. Conus medullaris and cauda equina: Conus extends to the L2 level. Conus and cauda equina appear normal. Paraspinal and other soft tissues: Negative for perispinal mass or inflammation. Disc levels: Diffusely preserved disc height and hydration. No significant facet spurring. The foramina are diffusely patent. IMPRESSION: MR THORACIC SPINE IMPRESSION Recent and un-healed T11 compression fracture with minimal height loss and no retropulsion. MR LUMBAR SPINE IMPRESSION 1. Recent and un-healed compression fractures at L3 and L5 with up to 60% height loss at L5 where there is also posterosuperior corner retropulsion contributing to moderate thecal sac narrowing. 2. Remote and healed L2 and L4 compression fractures. Electronically Signed   By: Tiburcio Pea M.D.   On: 02/22/2023 16:01   DG Lumbar Spine 2-3 Views  Result Date: 02/20/2023 CLINICAL DATA:  67 year old male with back pain, severe pain when lying down. EXAM: LUMBAR SPINE - 2-3 VIEW COMPARISON:  MRI lumbar spine 01/20/2022. CT Abdomen and Pelvis 09/25/2022. CTA chest 02/17/2023. FINDINGS: Osteopenia. Normal lumbar segmentation. Chronic L2, chronic L2, L4, and L5 compression fractures are stable since February, but moderate new L3 compression fracture since that time. Visible lower thoracic levels and L1 appear radiographically stable, although mild new T11 compression fracture demonstrated by CTA chest 3 days ago. Stable cholecystectomy clips. Aortoiliac calcified atherosclerosis. Chronic right Common iliac artery vascular stent. Nonobstructed visible bowel gas pattern with retained stool. IMPRESSION: 1. T11 (mild) and L3 (moderate) compression fractures are new since February. If specific therapy such as vertebroplasty is desired, noncontrast MRI or Nuclear Medicine Whole-body Bone Scan would [place] determine acuity and candidacy for augmentation. 2. Osteopenia with  chronic L2, L4, and L5 compression fractures. 3.  Aortic Atherosclerosis (ICD10-I70.0). Electronically Signed   By: Odessa Fleming M.D.   On: 02/20/2023 12:11   CT Angio Chest Pulmonary Embolism (PE) W or WO Contrast  Result Date: 02/17/2023 CLINICAL DATA:  Pulmonary embolism suspected, high probability, shortness of breath. EXAM: CT ANGIOGRAPHY CHEST WITH CONTRAST TECHNIQUE: Multidetector CT imaging of the chest was performed using the standard protocol during bolus administration of intravenous contrast. Multiplanar CT image reconstructions and MIPs were obtained to evaluate the  vascular anatomy. RADIATION DOSE REDUCTION: This exam was performed according to the departmental dose-optimization program which includes automated exposure control, adjustment of the mA and/or kV according to patient size and/or use of iterative reconstruction technique. CONTRAST:  75mL OMNIPAQUE IOHEXOL 350 MG/ML SOLN COMPARISON:  Chest CT dated 08/27/2022. FINDINGS: Cardiovascular: There is no pulmonary embolism identified within the main, lobar or segmental pulmonary arteries bilaterally. No thoracic aortic aneurysm. Scattered aortic atherosclerosis. Cardiomegaly. No pericardial effusion. Diffuse three-vessel coronary artery calcifications. Mediastinum/Nodes: No mass or enlarged lymph nodes within the mediastinum or perihilar regions. Esophagus is unremarkable. Trachea and central bronchi are unremarkable. Lungs/Pleura: Emphysematous changes bilaterally. Chronic scarring/atelectasis at the bilateral lung bases. New nodular consolidation within the posterior RIGHT lung, at the junction of the RIGHT upper lobe and RIGHT lower lobe, measuring 2.9 x 1 cm (series 6, image 67). No pleural effusion or pneumothorax. Upper Abdomen: No acute abnormality. Musculoskeletal: New mild compression deformity of the T11 vertebral body. Osseous structures about the chest are otherwise unremarkable. Review of the MIP images confirms the above findings.  IMPRESSION: 1. New nodular consolidation within the posterior RIGHT lung, at the junction of the RIGHT upper lobe and RIGHT lower lobe, measuring 2.9 x 1 cm. This is most likely a small focus of pneumonia or atelectasis. Recommend follow-up chest CT in 2-3 months to ensure resolution. If persisted, atypical appearance of a neoplastic process could not be excluded and PET-CT would be recommended for further characterization. 2. No pulmonary embolism. 3. Cardiomegaly. Diffuse three-vessel coronary artery calcifications. 4. New mild compression fracture deformity of the T11 vertebral body, age indeterminate. If any recent trauma or midline back pain, would consider nonemergent MRI of the thoracolumbar spine for further characterization. Aortic Atherosclerosis (ICD10-I70.0) and Emphysema (ICD10-J43.9). Electronically Signed   By: Bary Richard M.D.   On: 02/17/2023 08:50   DG Chest Port 1 View  Result Date: 02/16/2023 CLINICAL DATA:  Questionable sepsis EXAM: PORTABLE CHEST 1 VIEW COMPARISON:  Chest radiograph 09/25/2022 FINDINGS: Mild cardiomegaly is unchanged. The upper mediastinal contours are stable. Coarsened interstitial markings particularly in the lung bases are again seen, unchanged. Lucency in the upper lungs is unchanged consistent with underlying emphysema. There is no new focal airspace opacity. There is no pulmonary edema. There is no pleural effusion or pneumothorax. There is no acute osseous abnormality. IMPRESSION: 1. Unchanged cardiomegaly, coarsened interstitial markings, and emphysema. 2. No convincing acute airspace opacity. Electronically Signed   By: Lesia Hausen M.D.   On: 02/16/2023 10:51    Labs:  CBC: Recent Labs    09/07/22 0135 09/25/22 1021 02/16/23 0943 02/17/23 0439  WBC 12.1* 13.1* 14.4* 10.1  HGB 12.8* 14.9 14.2 13.3  HCT 41.2 47.6 46.5 41.2  PLT 466* 426* 427* 417*    COAGS: Recent Labs    08/27/22 1356 09/25/22 1021 02/16/23 0943 02/17/23 0756  INR 1.2 1.0  1.1 1.0  APTT  --  30 30  --     BMP: Recent Labs    09/08/22 0635 09/25/22 1021 02/16/23 0943 02/17/23 0439  NA 138 134* 135 136  K 4.7 3.4* 4.2 4.6  CL 97* 90* 98 99  CO2 36* 35* 27 28  GLUCOSE 110* 119* 117* 205*  BUN 16 17 22 23   CALCIUM 8.3* 9.1 8.8* 8.8*  CREATININE 0.74 0.82 0.97 0.95  GFRNONAA >60 >60 >60 >60    LIVER FUNCTION TESTS: Recent Labs    09/06/22 0647 09/07/22 0135 09/25/22 1021 02/16/23 0943  BILITOT 0.6 0.7 1.0  1.1  AST 46* 34 28 22  ALT 70* 57* 19 11  ALKPHOS 86 91 121 100  PROT 5.0* 5.2* 7.0 6.8  ALBUMIN 2.5* 2.6* 3.5 3.4*    Assessment and Plan: This is a 67 year old male with PMHx of paroxysmal atrial fibrillation not on anticoagulation, chronic systolic CHF EF 35-40%, pulmonary HTN, COPD, chronic hypoxemia on 4L, PAD on ASA and Plavix with last dose taken being 7/17, and HTN. He was admitted 7/11 for sepsis and cellulitis of RLE and COPD exacerbation.   Acute on chronic back pain with new injury 7/11 while turning in bed, imaging findings of new subacute compression fractures, pain is uncontrolled despite medication management.   The patient will be tentatively scheduled for the procedure in IR on Tuesday 7/23, aspirin and Plavix was last given 7/17 will wait 5 days while holding these medications, imaging, labs and vitals have been reviewed by my attending, Dr. Elby Showers and Dr. Grace Isaac today and are amendable to Kyphoplasty.  Risks and benefits of image guided Kyphoplasty at Lumbar level 3, Lumbar 5, and Thoracic 11 with anesthesia were discussed with the patient including, but not limited to education regarding the natural healing process of compression fractures without intervention, bleeding, infection, cement migration which may cause spinal cord damage, paralysis, pulmonary embolism. All of the patient's questions were answered.    The patient is hesitant to proceed with the kyphoplasty at this time and would like the weekend to see how his  pain improves with increased walking and pain medication.   IR to follow up Monday 7/22 to see if the patient would like to proceed with the kyphoplasty Tuesday or if pain is better controlled at that time. The hospitalist was made aware of the above plan.      Thank you for this interesting consult.  I greatly enjoyed meeting Erik [name] and look forward to participating in their care.  A copy of this report was sent to the requesting provider on this date.  Electronically Signed: Berneta Levins, PA-C 02/23/2023, 2:15 PM   I spent a total of 40 Minutes in face to face in clinical consultation, greater than 50% of which was counseling/coordinating care for uncontrolled back pain with acute/subacute compression fractures.

## 2023-02-23 NOTE — TOC Benefit Eligibility Note (Signed)
Patient Product/process development scientist completed.    The patient is currently admitted and upon discharge could be taking [place].  The current 30 day co-pay is $47.00.   The patient is insured through [employer]   This test claim was processed through [facility]- copay amounts may vary at other pharmacies due to pharmacy/plan contracts, or as the patient moves through the different stages of their insurance plan.    Patient Product/process development scientist completed.    The patient is currently admitted and upon discharge could be taking [place].  The current 30 day co-pay is $47.00.   The patient is insured through [employer]   This test claim was processed through [facility]- copay amounts may vary at other pharmacies due to pharmacy/plan contracts, or as the patient moves through the different stages of their insurance plan.

## 2023-02-23 NOTE — Progress Notes (Signed)
Progress Note   Patient: Erik [name] OZH:[id number] DOB: 09-Jan-1956 DOA: 02/16/2023     7 DOS: the patient was seen and examined on 02/23/2023   Brief hospital course: Erik [name] is a 67 y.o. male with medical history significant of paroxysmal atrial fibrillation not on anticoagulation, chronic systolic congestive heart failure with ejection fraction 35 to 40%, moderate pulm hypertension, COPD, chronic hypoxemia on 4 L oxygen, coronary disease, essential hypertension, who present to the hospital with complaints of worsening wounds in the right leg.  Patient is treated with vancomycin and Rocephin for cellulitis and wound infection. Patient was also treated with IV steroids for COPD exacerbation.  CT chest ruled out PE. Condition had improved, antibiotic changed to oral.  Steroids dose tapering down.  Patient also has compressive fracture in T11 and L3, does not appear to be acute.  Neurosurgery consult obtained due to persistent pain.  7/17.  Patient having a lot of pain still and having difficulty breathing secondary to the pain.  MRI of the thoracic and lumbar spine showing T11, L3 and L5 compression fractures. 7/18.  Held aspirin and Plavix for potential kyphoplasty's on Tuesday.  Assessment and Plan: * Severe back pain T11, L3 and L5 compression fractures.  Pain control.  Physical therapy recommending rehab.  Interventional radiology scheduled him kyphoplasty's on Tuesday.  Needs to be off aspirin and Plavix till then.  Sepsis (HCC) Present on admission with right lower extremity cellulitis.  Patient had tachycardia tachypnea and leukocytosis and mildly elevated lactic acid.  Continue antibiotics.  Now on doxycycline.  Venous ulcer (HCC) Will need follow-up at the wound care center.  COPD exacerbation (HCC) Chronic respiratory failure on oxygen 2 L.  Taper prednisone.  Continue inhalers and nebulizers.  PAD (peripheral artery disease) (HCC) Holding aspirin and Plavix for  kyphoplasty.  Paroxysmal atrial fibrillation with RVR (HCC) Patient on metoprolol.  Not on major anticoagulation.  Chronic systolic CHF (congestive heart failure) (HCC) Last EF 35% patient on metoprolol and Demadex.  Added Farxiga and Aldactone.  May end up starting low-dose ARB if blood pressure allows.  Obesity (BMI 30-39.9) BMI 31.85  Multiple lung nodules Recommend repeating a CT scan in a few months to ensure clearing.  Chronic respiratory failure with hypoxia (HCC) Currently on 4 L baseline oxygen 2 L.        Subjective: Patient still complains of back pain.  States he coughed up something and thinks his breathing is better.  Still having pain in his right leg.  Did not do well with physical therapy.  Admitted initially with cellulitis of the right lower extremity  Physical Exam: Vitals:   02/23/23 0747 02/23/23 0819 02/23/23 1348 02/23/23 1536  BP:  133/77  103/79  Pulse:  85  97  Resp:  18  18  Temp:  98.5 F (36.9 C)  97.8 F (36.6 C)  TempSrc:      SpO2: 95% 99% 95% 97%  Weight:      Height:       Physical Exam HENT:     Head: Normocephalic.     Mouth/Throat:     Pharynx: No oropharyngeal exudate.  Eyes:     General: Lids are normal.     Conjunctiva/sclera: Conjunctivae normal.  Cardiovascular:     Rate and Rhythm: Normal rate and regular rhythm.     Heart sounds: Normal heart sounds, S1 normal and S2 normal.  Pulmonary:     Breath sounds: Examination of the right-lower field reveals  decreased breath sounds. Examination of the left-lower field reveals decreased breath sounds. Decreased breath sounds present. No wheezing, rhonchi or rales.  Abdominal:     Palpations: Abdomen is soft.     Tenderness: There is no abdominal tenderness.  Musculoskeletal:     Right lower leg: Swelling present.     Left lower leg: Swelling present.  Skin:    General: Skin is warm.     Comments: Large wound posterior right leg.  Chronic lower extremity discoloration  bilaterally.  Neurological:     Mental Status: He is alert and oriented to person, place, and time.     Data Reviewed: MRI of thoracic and lumbar spine showed compression fracture at T11, L5 and L3.  Family Communication: Updated wife on the phone  Disposition: Status is: Inpatient Remains inpatient appropriate because: Interventional radiology is setting up for kyphoplasty on Tuesday of next week.  Needs to be off aspirin and Plavix for 5 days.  Planned Discharge Destination: Physical therapy recommending rehab but patient wants to go home.    Time spent: 28 minutes  Author: Alford Highland, MD 02/23/2023 3:56 PM  For on call review [email or web address].

## 2023-02-23 NOTE — Plan of Care (Signed)
  Problem: Fluid Volume: Goal: Hemodynamic stability will improve Outcome: Progressing   Problem: Respiratory: Goal: Ability to maintain adequate ventilation will improve Outcome: Progressing   Problem: Pain Managment: Goal: General experience of comfort will improve Outcome: Progressing   Problem: Safety: Goal: Ability to remain free from injury will improve Outcome: Progressing   Problem: Skin Integrity: Goal: Risk for impaired skin integrity will decrease Outcome: Progressing

## 2023-02-23 NOTE — Progress Notes (Signed)
Physical Therapy Treatment Patient Details Name: Erik [name] MRN: [id number] DOB: 25-Feb-1956 Today's Date: 02/23/2023   History of Present Illness This is a 67 y.o. male past medical history significant for peripheral arterial disease, CAD, CHF, atrial fibrillation, COPD. Admitted due to SOB, cellulitis of LE, and a venous ulcer.  T11 and L3 compression facture with back pain and TLSO recommended for comfort by neurosurgery.    PT Comments  Pt was long sitting in bed upon arrival. He endorse severe pain even at rest but was agreeable to participate. Pain greatly limits session progression. Pt also present with anxiety and SOB with very minimal activity. Mr. Tomey continues to require extensive assistance to exit bed, stand, and ambulate short distance. Total assist to apply TLSO at EOB prior to standing. Pt ambulated 10 ft in ~ 15 minutes. Poor activity tolerance. Will need continued skilled PT to maximize his independence and safety with all ADLs.     Assistance Recommended at Discharge Frequent or constant Supervision/Assistance  If plan is discharge home, recommend the following:  Can travel by private vehicle    A lot of help with walking and/or transfers;A lot of help with bathing/dressing/bathroom;Help with stairs or ramp for entrance;Assist for transportation;Assistance with cooking/housework      Equipment Recommendations  Other (comment) (If pt remains unwilling to go to STR... recommend W/C, lift, RW, BSC.)       Precautions / Restrictions Precautions Precautions: Fall Precaution Comments: back pain Required Braces or Orthoses: Spinal Brace Spinal Brace: Thoracolumbosacral orthotic;Applied in sitting position Restrictions Weight Bearing Restrictions: No     Mobility  Bed Mobility Overal bed mobility: Needs Assistance Bed Mobility: Rolling, Sidelying to Sit, Sit to Sidelying Rolling: Mod assist Sidelying to sit: Max assist Supine to sit: Max assist  General bed  mobility comments: pt is severely limited by pain and anxiety. WOB increases quickly on 5 L o2. sat EOB x ~ 8 minutes prior to standing. Prolonged recovery time to catch breath. sao2 >88% on 5 L    Transfers Overall transfer level: Needs assistance Equipment used: Rolling walker (2 wheels) Transfers: Sit to/from Stand Sit to Stand: Mod assist, From elevated surface  General transfer comment: pt was able to stand and sit with mod assist +1 from elevated bed height + required increased time due fatigue/pain    Ambulation/Gait Ambulation/Gait assistance: Min assist, Mod assist Gait Distance (Feet): 10 Feet Assistive device: Rolling walker (2 wheels) Gait Pattern/deviations: Trunk flexed, Step-to pattern, Antalgic Gait velocity: decreased  General Gait Details: Pt was able to ambulate from L side of bed, around to R side of bed to recliner. very slow, antalgic step to sequencing with poor gait posture and poor tolerance overall. Pt gets anxious and requires Vcs for relaxation and breathing techniques.    Balance Overall balance assessment: Needs assistance Sitting-balance support: Bilateral upper extremity supported, Feet supported Sitting balance-Leahy Scale: Fair  Standing balance support: Bilateral upper extremity supported, During functional activity, Reliant on assistive device for balance Standing balance-Leahy Scale: Poor Standing balance comment: Completely reliant on RW. Poor tolerance. High fall risk    Cognition Arousal/Alertness: Awake/alert Behavior During Therapy: Anxious Overall Cognitive Status: Within Functional Limits for tasks assessed  General Comments: Expressed concern for beathing vs panic attack episodes               Pertinent Vitals/Pain Pain Assessment Pain Assessment: 0-10 Pain Score: 8  Faces Pain Scale: Hurts even more Pain Location: Mid Back Pain Descriptors / Indicators: Discomfort,  Grimacing Pain Intervention(s): Limited activity within  patient's tolerance, Monitored during session, Premedicated before session, Repositioned     PT Goals (current goals can now be found in the care plan section) Acute Rehab PT Goals Patient Stated Goal: to get better Progress towards PT goals: Not progressing toward goals - comment (Pain, anxiety, and endurance limited)    Frequency    Min 1X/week      PT Plan Current plan remains appropriate       AM-PAC PT "6 Clicks" Mobility   Outcome Measure  Help needed turning from your back to your side while in a flat bed without using bedrails?: A Lot Help needed moving from lying on your back to sitting on the side of a flat bed without using bedrails?: A Lot Help needed moving to and from a bed to a chair (including a wheelchair)?: A Lot Help needed standing up from a chair using your arms (e.g., wheelchair or bedside chair)?: A Lot Help needed to walk in hospital room?: A Lot Help needed climbing 3-5 steps with a railing? : Total 6 Click Score: 11    End of Session Equipment Utilized During Treatment: Oxygen (5 L O2 throughout session with several occasions of SaO2 dropping to 88%. quickly resolves with breathing techniques and relaxation cues) Activity Tolerance: Patient limited by pain;Patient limited by fatigue Patient left: in chair;with call bell/phone within reach;with chair alarm set Nurse Communication: Mobility status PT Visit Diagnosis: Unsteadiness on feet (R26.81);Muscle weakness (generalized) (M62.81);Pain;History of falling (Z91.81);Difficulty in walking, not elsewhere classified (R26.2)     Time: 4132-4401 PT Time Calculation (min) (ACUTE ONLY): 43 min  Charges:    $Gait Training: 8-22 mins $Therapeutic Activity: 23-37 mins PT General Charges $$ ACUTE PT VISIT: 1 Visit                     Jetta Lout PTA 02/23/23, 11:42 AM

## 2023-02-24 DIAGNOSIS — S22080S Wedge compression fracture of T11-T12 vertebra, sequela: Secondary | ICD-10-CM | POA: Diagnosis not present

## 2023-02-24 DIAGNOSIS — S32050S Wedge compression fracture of fifth lumbar vertebra, sequela: Secondary | ICD-10-CM | POA: Diagnosis not present

## 2023-02-24 DIAGNOSIS — M549 Dorsalgia, unspecified: Secondary | ICD-10-CM | POA: Diagnosis not present

## 2023-02-24 DIAGNOSIS — S32030S Wedge compression fracture of third lumbar vertebra, sequela: Secondary | ICD-10-CM | POA: Diagnosis not present

## 2023-02-24 LAB — BASIC METABOLIC PANEL
Anion gap: 13 (ref 5–15)
BUN: 35 mg/dL — ABNORMAL HIGH (ref 8–23)
CO2: 39 mmol/L — ABNORMAL HIGH (ref 22–32)
Calcium: 8.6 mg/dL — ABNORMAL LOW (ref 8.9–10.3)
Chloride: 83 mmol/L — ABNORMAL LOW (ref 98–111)
Creatinine, Ser: 0.79 mg/dL (ref 0.61–1.24)
GFR, Estimated: >60 mL/min (ref >60)
Glucose, Bld: 98 mg/dL (ref 70–99)
Potassium: 3.7 mmol/L (ref 3.5–5.1)
Sodium: 135 mmol/L (ref 135–145)

## 2023-02-24 LAB — CBC
HCT: 50.3 % (ref 39.0–52.0)
Hemoglobin: 16 g/dL (ref 13.0–17.0)
MCH: 28.5 pg (ref 26.0–34.0)
MCHC: 31.8 g/dL (ref 30.0–36.0)
MCV: 89.5 fL (ref 80.0–100.0)
Platelets: 427 10*3/uL — ABNORMAL HIGH (ref 150–400)
RBC: 5.62 MIL/uL (ref 4.22–5.81)
RDW: 13.6 % (ref 11.5–15.5)
WBC: 16.4 10*3/uL — ABNORMAL HIGH (ref 4.0–10.5)
nRBC: 0 % (ref 0.0–0.2)

## 2023-02-24 MED ORDER — TORSEMIDE 20 MG PO TABS: 40.0000 mg | ORAL_TABLET | Freq: Every day | ORAL | Status: DC

## 2023-02-24 NOTE — Plan of Care (Signed)
  Problem: Respiratory: Goal: Ability to maintain adequate ventilation will improve Outcome: Progressing   Problem: Education: Goal: Knowledge of General Education information will improve Description: Including pain rating scale, medication(s)/side effects and non-pharmacologic comfort measures Outcome: Progressing   Problem: Health Behavior/Discharge Planning: Goal: Ability to manage health-related needs will improve Outcome: Progressing   Problem: Nutrition: Goal: Adequate nutrition will be maintained Outcome: Progressing   Problem: Coping: Goal: Level of anxiety will decrease Outcome: Progressing   Problem: Elimination: Goal: Will not experience complications related to urinary retention Outcome: Progressing   Problem: Pain Managment: Goal: General experience of comfort will improve Outcome: Progressing

## 2023-02-24 NOTE — Progress Notes (Signed)
Progress Note   Patient: Erik [name]:[id number] DOB: October 27, 1955 DOA: 02/16/2023     8 DOS: the patient was seen and examined on 02/24/2023   Brief hospital course: CLEO VILLAMIZAR is a 67 y.o. male with medical history significant of paroxysmal atrial fibrillation not on anticoagulation, chronic systolic congestive heart failure with ejection fraction 35 to 40%, moderate pulm hypertension, COPD, chronic hypoxemia on 4 L oxygen, coronary disease, essential hypertension, who present to the hospital with complaints of worsening wounds in the right leg.  Patient is treated with vancomycin and Rocephin for cellulitis and wound infection. Patient was also treated with IV steroids for COPD exacerbation.  CT chest ruled out PE. Condition had improved, antibiotic changed to oral.  Steroids dose tapering down.  Patient also has compressive fracture in T11 and L3, does not appear to be acute.  Neurosurgery consult obtained due to persistent pain.  7/17.  Patient having a lot of pain still and having difficulty breathing secondary to the pain.  MRI of the thoracic and lumbar spine showing T11, L3 and L5 compression fractures. 7/18.  Held aspirin and Plavix for potential kyphoplasty's on Tuesday. 7/19.  Patient still with back pain and difficulty moving around.  Feels his breathing has been better since yesterday when he coughed up some phlegm.  Assessment and Plan: * Severe back pain T11, L3 and L5 compression fractures.  Pain control.  Physical therapy recommending rehab.  Interventional radiology scheduled him kyphoplasty's on Tuesday.  Needs to be off aspirin and Plavix till then.  Chronic respiratory failure with hypoxia (HCC) Currently on 4 L baseline oxygen 2 L.  With good pulse ox can continue to try to taper down to baseline 2 L.  COPD exacerbation (HCC) Chronic respiratory failure on oxygen 2 L.  Taper prednisone to 10 mg daily..  Continue inhalers and nebulizers.  Breathing better since he  coughed up some phlegm.  Currently on 4 L but have room to taper down to 2 L.  Sepsis (HCC) Present on admission with right lower extremity cellulitis.  Patient had tachycardia tachypnea and leukocytosis and mildly elevated lactic acid.  Continue antibiotics.  Now on doxycycline.  Venous ulcer (HCC) Will need follow-up at the wound care center.  PAD (peripheral artery disease) (HCC) Holding aspirin and Plavix for kyphoplasty.  Paroxysmal atrial fibrillation with RVR (HCC) Patient on metoprolol.  Not on major anticoagulation.  Chronic systolic CHF (congestive heart failure) (HCC) Last EF 35% patient on metoprolol, will decrease dose of Demadex down to 40 mg daily with contraction alkalosis.  Continue Farxiga and spironolactone.  Obesity (BMI 30-39.9) BMI 31.85  Multiple lung nodules Recommend repeating a CT scan in a few months to ensure clearing.        Subjective: Patient feels that his breathing is better.  Still having quite a bit of back pain.  Admitted initially for sepsis.  Physical Exam: Vitals:   02/23/23 2045 02/23/23 2356 02/24/23 0000 02/24/23 1402  BP: (!) 151/137 125/76    Pulse: 65 (!) 50 66 75  Resp: 18 18    Temp:  97.8 F (36.6 C)    TempSrc:      SpO2: 96% (!) 85% 95% 95%  Weight:      Height:       Physical Exam HENT:     Head: Normocephalic.     Mouth/Throat:     Pharynx: No oropharyngeal exudate.  Eyes:     General: Lids are normal.  Conjunctiva/sclera: Conjunctivae normal.  Cardiovascular:     Rate and Rhythm: Normal rate and regular rhythm.     Heart sounds: Normal heart sounds, S1 normal and S2 normal.  Pulmonary:     Breath sounds: Examination of the right-lower field reveals decreased breath sounds. Examination of the left-lower field reveals decreased breath sounds. Decreased breath sounds present. No wheezing, rhonchi or rales.  Abdominal:     Palpations: Abdomen is soft.     Tenderness: There is no abdominal tenderness.   Musculoskeletal:     Right lower leg: Swelling present.     Left lower leg: Swelling present.  Skin:    General: Skin is warm.     Comments: Large wound posterior right leg.  Chronic lower extremity discoloration bilaterally.  Neurological:     Mental Status: He is alert and oriented to person, place, and time.     Data Reviewed: Creatinine 0.79, CO2 39, white blood cell count 16.4, hemoglobin 16, platelet count 427.  Family Communication: Spoke with wife on the phone  Disposition: Status is: Inpatient Remains inpatient appropriate because: Scheduled for kyphoplasty on Tuesday  Planned Discharge Destination: Home with Home Health    Time spent: 28 minutes  Author: Alford Highland, MD 02/24/2023 3:49 PM  For on call review [email or web address].

## 2023-02-24 NOTE — Progress Notes (Signed)
Occupational Therapy Treatment Patient Details Name: Erik [name] MRN: [id number] DOB: [birth year] Today's Date: 02/24/2023   History of present illness This is a 67 y.o. male past medical history significant for peripheral arterial disease, CAD, CHF, atrial fibrillation, COPD. Admitted due to SOB, cellulitis of LE, and a venous ulcer.  T11 and L3 compression facture with back pain and TLSO recommended for comfort by neurosurgery.   OT comments  Pt seen for OT treatment on this date. Upon arrival to room pt resting in bed, agreeable to tx. Pt requires MOD A for bed mobility to reach EOB. MAX A for donning/doffing brace. MOD A to stand with RW. While standing pt reported feeling muscle spasm which hindered mobility. Pt returned back to bed, with MAX A to facilitate comfort. Pt coached through breathing techniques and positioning while in bed. Pt making progress toward goals, will continue to follow POC. Discharge recommendation remains appropriate.     Recommendations for follow up therapy are one component of a multi-disciplinary discharge planning process, led by the attending physician.  Recommendations may be updated based on patient status, additional functional criteria and insurance authorization.    Assistance Recommended at Discharge Intermittent Supervision/Assistance  Patient can return home with the following  A lot of help with walking and/or transfers;A lot of help with bathing/dressing/bathroom;Assistance with cooking/housework;Assist for transportation;Help with stairs or ramp for entrance;Direct supervision/assist for medications management   Equipment Recommendations  Other (comment) (defer to next venue of care)    Recommendations for Other Services      Precautions / Restrictions Precautions Precautions: Fall Precaution Comments: back pain Required Braces or Orthoses: Spinal Brace Spinal Brace: Thoracolumbosacral orthotic;Applied in sitting  position Restrictions Weight Bearing Restrictions: No       Mobility Bed Mobility Overal bed mobility: Needs Assistance Bed Mobility: Rolling, Sidelying to Sit, Sit to Sidelying Rolling: Mod assist Sidelying to sit: Mod assist   Sit to supine: Max assist   General bed mobility comments: MOD A with hand held assist and LE management to sit EOB. MAX A due to muscle spasm and pain.    Transfers Overall transfer level: Needs assistance Equipment used: Rolling walker (2 wheels) Transfers: Sit to/from Stand Sit to Stand: Mod assist, From elevated surface           General transfer comment: Pt experienced muscle spasm while standing, which lasted for a few minutes. Pt requested to sit, but froze standing position.     Balance Overall balance assessment: Needs assistance Sitting-balance support: Bilateral upper extremity supported, Feet supported Sitting balance-Leahy Scale: Fair Sitting balance - Comments: Bilateral upper support required to maintain balance. Elevated bed to position feet flat on ground   Standing balance support: Bilateral upper extremity supported, During functional activity, Reliant on assistive device for balance Standing balance-Leahy Scale: Poor Standing balance comment: Completely reliant on RW. Poor tolerance. High fall risk                           ADL either performed or assessed with clinical judgement   ADL Overall ADL's : Needs assistance/impaired                 Upper Body Dressing : Maximal assistance;Sitting Upper Body Dressing Details (indicate cue type and reason): Donn/doff brace                 Functional mobility during ADLs: Moderate assistance;Rolling walker (2 wheels) General ADL Comments: Pt currently requires  MOD A for LB ADL tasks.    Extremity/Trunk Assessment Upper Extremity Assessment Upper Extremity Assessment: Overall WFL for tasks assessed   Lower Extremity Assessment Lower Extremity  Assessment: Generalized weakness        Vision       Perception     Praxis      Cognition Arousal/Alertness: Awake/alert Behavior During Therapy: Anxious Overall Cognitive Status: Within Functional Limits for tasks assessed                                 General Comments: anticipating pain increasing anxiety - overall willing to participate        Exercises      Shoulder Instructions       General Comments      Pertinent Vitals/ Pain       Pain Assessment Pain Assessment: Faces Faces Pain Scale: Hurts even more Pain Descriptors / Indicators: Discomfort, Grimacing Pain Intervention(s): Limited activity within patient's tolerance  Home Living                                          Prior Functioning/Environment              Frequency  Min 1X/week        Progress Toward Goals  OT Goals(current goals can now be found in the care plan section)  Progress towards OT goals: Progressing toward goals  Acute Rehab OT Goals Patient Stated Goal: To feel better OT Goal Formulation: With patient Time For Goal Achievement: 03/03/23 Potential to Achieve Goals: Good ADL Goals Pt Will Transfer to Toilet: with modified independence;ambulating Pt Will Perform Toileting - Clothing Manipulation and hygiene: with modified independence Additional ADL Goal #1: Pt will utilize PLB throughout exertional activity with PRN VC in order to minimize SOB. Additional ADL Goal #2: pt will verbalize plan to implement at least 2 learned ECS into daily ADL/IADL routine.  Plan Discharge plan remains appropriate    Co-evaluation                 AM-PAC OT "6 Clicks" Daily Activity     Outcome Measure   Help from another person eating meals?: None Help from another person taking care of personal grooming?: A Little Help from another person toileting, which includes using toliet, bedpan, or urinal?: A Lot Help from another person bathing  (including washing, rinsing, drying)?: A Lot Help from another person to put on and taking off regular upper body clothing?: A Little Help from another person to put on and taking off regular lower body clothing?: A Lot 6 Click Score: 16    End of Session Equipment Utilized During Treatment: Rolling walker (2 wheels);Oxygen;Other (comment)  OT Visit Diagnosis: Other abnormalities of gait and mobility (R26.89);Muscle weakness (generalized) (M62.81)   Activity Tolerance Patient limited by fatigue;Other (comment)   Patient Left in bed;with call bell/phone within reach;with bed alarm set   Nurse Communication          Time: 6213-0865 OT Time Calculation (min): 30 min  Charges: OT General Charges $OT Visit: 1 Visit OT Treatments $Self Care/Home Management : 23-37 mins  Thresa Ross, OTS 02/24/2023, 3:00 PM

## 2023-02-24 NOTE — Consult Note (Addendum)
[facility] [employer] service manager [facility]) Accountable Care Organization (ACO) [facility] Liaison Note  02/24/2023  Erik [name] [birth year] [id number]  Location: [facility] RN Hospital Liaison screened the patient remotely at [facility].  Insurance: [employer]   Erik [name] is a 67 y.o. male who is a Primary Care Patient of Pcp, No. The patient was screened for readmission hospitalization with noted high risk score for unplanned readmission risk with 2 IP/1 ED in 6 months.  The patient was assessed for potential [facility]) Care Management service needs for post hospital transition for care coordination. Review of patient's electronic medical record reveals patient was admitted for Cellulitis lower extremity. No PCP listed and liaison unsuccessful with attempts to reach pt to verify. TOC note indicates pt declined STC at this time. Will go home in the care of his spouse.   [facility] Care Management/Population Health does not replace or interfere with any arrangements made by the Inpatient Transition of Care team.   For questions contact:  Elliot Cousin, RN, [facility] Liaison [place]   Population Health Office Hours MTWF  8:00 am-6:00 pm Off on Thursday [phone] mobile [phone] [Office toll free line] Office Hours are M-F 8:30 - 5 pm 24 hour nurse advise line [phone] Concierge  [email or web address]

## 2023-02-24 NOTE — Progress Notes (Signed)
   02/24/23 1500  Spiritual Encounters  Type of Visit Follow up  Care provided to: Pt and family  Referral source Chaplain assessment  Reason for visit Routine spiritual support  OnCall Visit No  Spiritual Framework  Patient Stress Factors None identified  Family Stress Factors None identified   Chaplain visited with patient and family providing compassionate care. Chaplain spiritual support services remain available as the need arises.

## 2023-02-25 DIAGNOSIS — S32030S Wedge compression fracture of third lumbar vertebra, sequela: Secondary | ICD-10-CM | POA: Diagnosis not present

## 2023-02-25 DIAGNOSIS — S22080S Wedge compression fracture of T11-T12 vertebra, sequela: Secondary | ICD-10-CM | POA: Diagnosis not present

## 2023-02-25 DIAGNOSIS — S32050A Wedge compression fracture of fifth lumbar vertebra, initial encounter for closed fracture: Secondary | ICD-10-CM

## 2023-02-25 DIAGNOSIS — S32050S Wedge compression fracture of fifth lumbar vertebra, sequela: Secondary | ICD-10-CM | POA: Diagnosis not present

## 2023-02-25 DIAGNOSIS — R042 Hemoptysis: Secondary | ICD-10-CM

## 2023-02-25 DIAGNOSIS — M549 Dorsalgia, unspecified: Secondary | ICD-10-CM | POA: Diagnosis not present

## 2023-02-25 LAB — BASIC METABOLIC PANEL
Anion gap: 7 (ref 5–15)
BUN: 37 mg/dL — ABNORMAL HIGH (ref 8–23)
CO2: 42 mmol/L — ABNORMAL HIGH (ref 22–32)
Calcium: 8.5 mg/dL — ABNORMAL LOW (ref 8.9–10.3)
Chloride: 86 mmol/L — ABNORMAL LOW (ref 98–111)
Creatinine, Ser: 0.96 mg/dL (ref 0.61–1.24)
GFR, Estimated: >60 mL/min (ref >60)
Glucose, Bld: 121 mg/dL — ABNORMAL HIGH (ref 70–99)
Potassium: 3.6 mmol/L (ref 3.5–5.1)
Sodium: 135 mmol/L (ref 135–145)

## 2023-02-25 LAB — RESPIRATORY PANEL BY PCR

## 2023-02-25 LAB — SARS CORONAVIRUS 2 BY RT PCR: SARS Coronavirus 2 by RT PCR: NEGATIVE

## 2023-02-25 MED ORDER — TORSEMIDE 20 MG PO TABS: 40.0000 mg | ORAL_TABLET | Freq: Every day | ORAL | Status: DC

## 2023-02-25 MED ORDER — BENZONATATE 100 MG PO CAPS
100.0000 mg | ORAL_CAPSULE | Freq: Three times a day (TID) | ORAL | Status: DC
  Administered 2023-02-25 – 2023-03-04 (×20): 100 mg via ORAL
  Filled 2023-02-25 (×21): qty 1

## 2023-02-25 MED ORDER — ALUM & MAG HYDROXIDE-SIMETH 200-200-20 MG/5ML PO SUSP
30.0000 mL | ORAL | Status: DC | PRN
  Administered 2023-02-26: 30 mL via ORAL
  Filled 2023-02-25: qty 30

## 2023-02-25 NOTE — Plan of Care (Signed)
  Problem: Education: Goal: Knowledge of General Education information will improve Description: Including pain rating scale, medication(s)/side effects and non-pharmacologic comfort measures Outcome: Progressing   Problem: Health Behavior/Discharge Planning: Goal: Ability to manage health-related needs will improve Outcome: Progressing   Problem: Clinical Measurements: Goal: Diagnostic test results will improve Outcome: Progressing Goal: Respiratory complications will improve Outcome: Progressing Goal: Cardiovascular complication will be avoided Outcome: Progressing   Problem: Activity: Goal: Risk for activity intolerance will decrease Outcome: Progressing   Problem: Nutrition: Goal: Adequate nutrition will be maintained Outcome: Progressing   Problem: Elimination: Goal: Will not experience complications related to bowel motility Outcome: Progressing Goal: Will not experience complications related to urinary retention Outcome: Progressing   Problem: Pain Managment: Goal: General experience of comfort will improve Outcome: Progressing   

## 2023-02-25 NOTE — Progress Notes (Signed)
Progress Note   Patient: Erik [name] [id number]:[id number] DOB: [birth year] DOA: 02/16/2023     9 DOS: the patient was seen and examined on 02/25/2023   Brief hospital course: Erik [name] is a 67 y.o. male with medical history significant of paroxysmal atrial fibrillation not on anticoagulation, chronic systolic congestive heart failure with ejection fraction 35 to 40%, moderate pulm hypertension, COPD, chronic hypoxemia on 4 L oxygen, coronary disease, essential hypertension, who present to the hospital with complaints of worsening wounds in the right leg.  Patient is treated with vancomycin and Rocephin for cellulitis and wound infection. Patient was also treated with IV steroids for COPD exacerbation.  CT chest ruled out PE. Condition had improved, antibiotic changed to oral.  Steroids dose tapering down.  Patient also has compressive fracture in Erik [name] and Erik, does not appear to be acute.  Neurosurgery consult obtained due to persistent pain.  7/17.  Patient having a lot of pain still and having difficulty breathing secondary to the pain.  MRI of the thoracic and lumbar spine showing Erik [name], Erik [name] compression fractures. 7/18.  Held aspirin and Plavix for potential kyphoplasty's on Tuesday. 7/19.  Patient still with back pain and difficulty moving around.  Feels his breathing has been better since yesterday when he coughed up some phlegm. 7/20.  Patient was coughing this morning coughed up a little bit of blood.  Family wanted me to test for COVID and that was negative.  Seen this morning while trying to stand and getting into the chair needing quite a bit of support with nursing staff.  Assessment and Plan: * Severe back pain Erik [name], Erik [name] compression fractures.  Pain control.  Physical therapy recommending rehab.  Interventional radiology scheduled kyphoplasty's on Tuesday 7/23.  Needs to be off aspirin and Plavix till then.  Chronic respiratory failure with hypoxia (HCC) Currently on 4 L  baseline oxygen 2 L.  With good pulse ox can continue to try to taper down to baseline 2 L.  COPD exacerbation (HCC) Chronic respiratory failure on oxygen 2 L.  Will complete prednisone today.  Continue inhalers and nebulizers.  Breathing better since he coughed up some phlegm.  Currently on 4 L but have room to taper down to 2 L.  Family requested COVID test which was negative.  Viral respiratory panel pending.  Hemoptysis Only slight.  Likely from coughing.  Will give cough medication Robitussin and Tessalon Perles.  Patient will end up needing a repeat CT scan in 2 to 3 months.  Aspirin and Plavix on hold.  Continue Lovenox injections for now.  Sepsis (HCC) Present on admission with right lower extremity cellulitis.  Patient had tachycardia tachypnea and leukocytosis and mildly elevated lactic acid.  Continue antibiotics.  Now on doxycycline.  Venous ulcer (HCC) Will need follow-up at the wound care center.  PAD (peripheral artery disease) (HCC) Holding aspirin and Plavix for kyphoplasty.  Paroxysmal atrial fibrillation with RVR (HCC) Patient on metoprolol.  Not on major anticoagulation.  Chronic systolic CHF (congestive heart failure) (HCC) Last EF 35% patient on metoprolol, will hold Demadex today with contraction alkalosis.  Continue Farxiga and spironolactone.  Obesity (BMI 30-39.9) BMI 31.85  Multiple lung nodules Recommend repeating a CT scan in a few months to ensure clearing.        Subjective: Patient coughed up some slight blood this morning.  Feels better after he coughed up some phlegm.  Felt nauseous after walking a few steps into the bed.  Physical Exam: Vitals:   02/24/23 2040 02/24/23 2123 02/25/23 0025 02/25/23 0726  BP:  107/76 109/82 (!) 129/92  Pulse:  82 76 85  Resp:  17 18 18   Temp:  98.5 F (36.9 C) 97.7 F (36.5 C) 98 F (36.7 C)  TempSrc:      SpO2: 95% 100% 98% 100%  Weight:      Height:       Physical Exam HENT:     Head:  Normocephalic.     Mouth/Throat:     Pharynx: No oropharyngeal exudate.  Eyes:     General: Lids are normal.     Conjunctiva/sclera: Conjunctivae normal.  Cardiovascular:     Rate and Rhythm: Normal rate and regular rhythm.     Heart sounds: Normal heart sounds, S1 normal and S2 normal.  Pulmonary:     Breath sounds: Examination of the right-lower field reveals decreased breath sounds. Examination of the left-lower field reveals decreased breath sounds. Decreased breath sounds present. No wheezing, rhonchi or rales.  Abdominal:     Palpations: Abdomen is soft.     Tenderness: There is no abdominal tenderness.  Musculoskeletal:     Right lower leg: Swelling present.     Left lower leg: Swelling present.  Skin:    General: Skin is warm.     Comments: Large wound posterior right leg.  Chronic lower extremity discoloration bilaterally.  Neurological:     Mental Status: He is alert and oriented to person, place, and time.     Data Reviewed: COVID test negative, CO2 42, creatinine 0.96  Family Communication: Left message for patient's wife  Disposition: Status is: Inpatient Remains inpatient appropriate because: Patient will have kyphoplasty on Tuesday  Planned Discharge Destination: Home with Home Health    Time spent: 28 minutes  Author: Alford Highland, MD 02/25/2023 2:32 PM  For on call review [email or web address].

## 2023-02-25 NOTE — Assessment & Plan Note (Addendum)
Only slight.  Likely from coughing.  Will give cough medication Robitussin and Tessalon Perles.  Patient will end up needing a repeat CT scan in 2 to 3 months.  Aspirin and Plavix on hold.  No hemoptysis while on heparin drip.

## 2023-02-25 NOTE — Plan of Care (Signed)
  Problem: Fluid Volume: Goal: Hemodynamic stability will improve Outcome: Progressing   Problem: Clinical Measurements: Goal: Diagnostic test results will improve Outcome: Progressing   Problem: Respiratory: Goal: Ability to maintain adequate ventilation will improve Outcome: Progressing   Problem: Education: Goal: Knowledge of General Education information will improve Description: Including pain rating scale, medication(s)/side effects and non-pharmacologic comfort measures Outcome: Progressing   Problem: Activity: Goal: Risk for activity intolerance will decrease Outcome: Progressing   Problem: Coping: Goal: Level of anxiety will decrease Outcome: Progressing

## 2023-02-26 DIAGNOSIS — J441 Chronic obstructive pulmonary disease with (acute) exacerbation: Secondary | ICD-10-CM | POA: Diagnosis not present

## 2023-02-26 DIAGNOSIS — M549 Dorsalgia, unspecified: Secondary | ICD-10-CM | POA: Diagnosis not present

## 2023-02-26 DIAGNOSIS — J9611 Chronic respiratory failure with hypoxia: Secondary | ICD-10-CM | POA: Diagnosis not present

## 2023-02-26 DIAGNOSIS — R042 Hemoptysis: Secondary | ICD-10-CM | POA: Diagnosis not present

## 2023-02-26 LAB — CBC
HCT: 50.6 % (ref 39.0–52.0)
Hemoglobin: 15.8 g/dL (ref 13.0–17.0)
MCH: 28.5 pg (ref 26.0–34.0)
MCHC: 31.2 g/dL (ref 30.0–36.0)
MCV: 91.2 fL (ref 80.0–100.0)
Platelets: 433 10*3/uL — ABNORMAL HIGH (ref 150–400)
RBC: 5.55 MIL/uL (ref 4.22–5.81)
RDW: 13.8 % (ref 11.5–15.5)
WBC: 16.2 10*3/uL — ABNORMAL HIGH (ref 4.0–10.5)
nRBC: 0 % (ref 0.0–0.2)

## 2023-02-26 LAB — BASIC METABOLIC PANEL
Anion gap: 13 (ref 5–15)
BUN: 28 mg/dL — ABNORMAL HIGH (ref 8–23)
CO2: 35 mmol/L — ABNORMAL HIGH (ref 22–32)
Calcium: 9 mg/dL (ref 8.9–10.3)
Chloride: 85 mmol/L — ABNORMAL LOW (ref 98–111)
Creatinine, Ser: 0.93 mg/dL (ref 0.61–1.24)
GFR, Estimated: >60 mL/min (ref >60)
Glucose, Bld: 108 mg/dL — ABNORMAL HIGH (ref 70–99)
Potassium: 4.3 mmol/L (ref 3.5–5.1)
Sodium: 133 mmol/L — ABNORMAL LOW (ref 135–145)

## 2023-02-26 LAB — PROTIME-INR
INR: 1.1 (ref 0.8–1.2)
Prothrombin Time: 14.5 seconds (ref 11.4–15.2)

## 2023-02-26 LAB — APTT
aPTT: >200 seconds (ref 24–36)
aPTT: 164 seconds (ref 24–36)

## 2023-02-26 LAB — HEPARIN LEVEL (UNFRACTIONATED): Heparin Unfractionated: 0.98 IU/mL — ABNORMAL HIGH (ref 0.30–0.70)

## 2023-02-26 MED ORDER — HEPARIN (PORCINE) 25000 UT/250ML-% IV SOLN
1150.0000 [IU]/h | INTRAVENOUS | Status: DC
  Administered 2023-02-26: 1350 [IU]/h via INTRAVENOUS
  Administered 2023-02-27: 1200 [IU]/h via INTRAVENOUS
  Administered 2023-02-28: 1150 [IU]/h via INTRAVENOUS
  Filled 2023-02-26 (×3): qty 250

## 2023-02-26 MED ORDER — HEPARIN BOLUS VIA INFUSION
4500.0000 [IU] | Freq: Once | INTRAVENOUS | Status: AC
  Administered 2023-02-26: 4500 [IU] via INTRAVENOUS
  Filled 2023-02-26: qty 4500

## 2023-02-26 MED ORDER — METOPROLOL SUCCINATE ER 50 MG PO TB24
50.0000 mg | ORAL_TABLET | Freq: Every day | ORAL | Status: DC
  Administered 2023-02-26 – 2023-03-01 (×4): 50 mg via ORAL
  Filled 2023-02-26 (×4): qty 1

## 2023-02-26 MED ORDER — METOPROLOL TARTRATE 25 MG PO TABS: 25.0000 mg | ORAL_TABLET | Freq: Two times a day (BID) | ORAL | Status: DC

## 2023-02-26 MED ORDER — TORSEMIDE 20 MG PO TABS
20.0000 mg | ORAL_TABLET | Freq: Every day | ORAL | Status: DC
  Administered 2023-02-26 – 2023-02-27 (×2): 20 mg via ORAL
  Filled 2023-02-26 (×2): qty 1

## 2023-02-26 MED ORDER — POLYETHYLENE GLYCOL 3350 17 G PO PACK
17.0000 g | PACK | Freq: Two times a day (BID) | ORAL | Status: DC
  Administered 2023-02-27 – 2023-03-03 (×8): 17 g via ORAL
  Filled 2023-02-26 (×12): qty 1

## 2023-02-26 NOTE — Progress Notes (Signed)
Date and time results received: 02/26/23 1337  Test: PTT Critical Value: >200  Name of Provider Notified: Dr. Renae Gloss  Orders Received? Or Actions Taken?: Actions Taken:   Repeat blood draw.

## 2023-02-26 NOTE — Consult Note (Signed)
ANTICOAGULATION CONSULT NOTE - Initial Consult  Pharmacy Consult for Heparin infusion Indication:  PVD  Allergies  Allergen Reactions   Benadryl [Diphenhydramine Hcl] Shortness Of Breath   Morphine And Codeine Swelling    Difficulty breathing per patient    Oxycodone Swelling    Severe mouth swelling requiring medical intervention    Patient Measurements: Height: 5\' 10"  (177.8 cm) Weight: 100.7 kg (222 lb) IBW/kg (Calculated) : 73 Heparin Dosing Weight: 94.1 kg  Vital Signs: Temp: 98.1 F (36.7 C) (07/21 1601) BP: 107/63 (07/21 1601) Pulse Rate: 70 (07/21 1601)  Labs: Recent Labs    02/24/23 0857 02/25/23 0445 02/26/23 0450 02/26/23 1239 02/26/23 1457 02/26/23 1826  HGB 16.0  --  15.8  --   --   --   HCT 50.3  --  50.6  --   --   --   PLT 427*  --  433*  --   --   --   APTT  --   --   --  >200* 164*  --   LABPROT  --   --   --  14.5  --   --   INR  --   --   --  1.1  --   --   HEPARINUNFRC  --   --   --   --   --  0.98*  CREATININE 0.79 0.96 0.93  --   --   --     Estimated Creatinine Clearance: 90.4 mL/min (by C-G formula based on SCr of 0.93 mg/dL).   Medical History: Past Medical History:  Diagnosis Date   Arthritis    Asthma    Atrial fibrillation (HCC)    CHF (congestive heart failure) (HCC)    COPD (chronic obstructive pulmonary disease) (HCC)    Coronary artery disease    GERD (gastroesophageal reflux disease)    Hypertension    Myocardial infarction (HCC)    Shortness of breath dyspnea     Pertinent Medications:  No history of chronic anticoagulant use PTA  Assessment: 67 y.o. male with medical history significant of paroxysmal atrial fibrillation not on anticoagulation, chronic systolic congestive heart failure with ejection fraction 35 to 40%, moderate pulm hypertension, COPD, chronic hypoxemia on 4 L oxygen, coronary disease, essential hypertension, who present to the hospital with complaints of worsening wounds in the right leg. Baseline  labs are ordered and pending.  Goal of Therapy:  Heparin level 0.3-0.7 units/ml Monitor platelets by anticoagulation protocol: Yes   7/21 1826 HL 0.98, supratherapeutic @ 1350 u/hr  Plan:  HL supratherapeutic Reduce heparin infusion rate to 1200 units.hr Recheck Anti-Xa level 6 hours after rate change Daily CBC while on heparin  Barrie Folk 02/26/2023,6:51 PM

## 2023-02-26 NOTE — Plan of Care (Signed)
  Problem: Fluid Volume: Goal: Hemodynamic stability will improve Outcome: Progressing   Problem: Clinical Measurements: Goal: Diagnostic test results will improve Outcome: Progressing   Problem: Respiratory: Goal: Ability to maintain adequate ventilation will improve Outcome: Progressing   Problem: Education: Goal: Knowledge of General Education information will improve Description: Including pain rating scale, medication(s)/side effects and non-pharmacologic comfort measures Outcome: Progressing   Problem: Clinical Measurements: Goal: Ability to maintain clinical measurements within normal limits will improve Outcome: Progressing   Problem: Activity: Goal: Risk for activity intolerance will decrease Outcome: Progressing

## 2023-02-26 NOTE — Progress Notes (Signed)
Progress Note   Patient: Erik [name] [id number]:[id number] DOB: 21-Dec-1955 DOA: 02/16/2023     10 DOS: the patient was seen and examined on 02/26/2023   Brief hospital course: Erik [name] is a 67 y.o. male with medical history significant of paroxysmal atrial fibrillation not on anticoagulation, chronic systolic congestive heart failure with ejection fraction 35 to 40%, moderate pulm hypertension, COPD, chronic hypoxemia on 4 L oxygen, coronary disease, essential hypertension, who present to the hospital with complaints of worsening wounds in the right leg.  Patient is treated with vancomycin and Rocephin for cellulitis and wound infection. Patient was also treated with IV steroids for COPD exacerbation.  CT chest ruled out PE. Condition had improved, antibiotic changed to oral.  Steroids dose tapering down.  Patient also has compressive fracture in T11 and L3, does not appear to be acute.  Neurosurgery consult obtained due to persistent pain.  7/17.  Patient having a lot of pain still and having difficulty breathing secondary to the pain.  MRI of the thoracic and lumbar spine showing T11, L3 and L5 compression fractures. 7/18.  Held aspirin and Plavix for potential kyphoplasty's on Tuesday. 7/19.  Patient still with back pain and difficulty moving around.  Feels his breathing has been better since yesterday when he coughed up some phlegm. 7/20.  Patient was coughing this morning coughed up a little bit of blood.  Family wanted me to test for COVID and that was negative.  Seen this morning while trying to stand and getting into the chair needing quite a bit of support with nursing staff. 7/21.  Noticed some blackish brownish discoloration under his first toe on the right foot and on his foot below the fifth/fourth toe.  Will start heparin drip to thin the blood and while off his aspirin and Plavix.  Spoke with vascular surgery.  Assessment and Plan: * Severe back pain T11, L3 and L5 compression  fractures.  Pain control.  Physical therapy recommending rehab.  Interventional radiology scheduled kyphoplasty's on Tuesday 7/23.  Needs to be off aspirin and Plavix till then.  Chronic respiratory failure with hypoxia (HCC) Back to baseline 2 L.  COPD exacerbation (HCC) Chronic respiratory failure on oxygen 2 L.  Completed prednisone.  Continue inhalers and nebulizers.  Breathing better since he coughed up some phlegm.  Currently back to baseline oxygen 2 L.  Hemoptysis Only slight.  Likely from coughing.  Will give cough medication Robitussin and Tessalon Perles.  Patient will end up needing a repeat CT scan in 2 to 3 months.  Aspirin and Plavix on hold.  May have a little more hemoptysis being on heparin drip.  Sepsis (HCC) Present on admission with right lower extremity cellulitis.  Patient had tachycardia tachypnea and leukocytosis and mildly elevated lactic acid.  Completed doxycycline.  Venous ulcer (HCC) Will need follow-up at the wound care center.  PAD (peripheral artery disease) (HCC) Holding aspirin and Plavix for kyphoplasty.  With some skin changes on the right foot I will start heparin drip for better blood supply.  We were able to Doppler pulses.  Case discussed with vascular surgery.  Paroxysmal atrial fibrillation with RVR (HCC) Patient on metoprolol.  Not on major anticoagulation but I put on heparin drip for now until patient can go back on aspirin and Plavix.  Chronic systolic CHF (congestive heart failure) (HCC) Last EF 35% patient on Toprol, will hold Demadex today with contraction alkalosis.  Continue Farxiga and spironolactone.  Obesity (BMI 30-39.9) BMI 31.85  Multiple lung nodules Recommend repeating a CT scan in a few months to ensure clearing.        Subjective: Patient still complaining of cough.  Still having back pain.  Patient will have kyphoplasty on Tuesday.  No complaints of pain in his foot.  Physical Exam: Vitals:   02/26/23 0725  02/26/23 0816 02/26/23 1408 02/26/23 1601  BP: 106/73   107/63  Pulse: 73   70  Resp: 16   18  Temp: 97.6 F (36.4 C)   98.1 F (36.7 C)  TempSrc:      SpO2: 96% 95% 92% 96%  Weight:      Height:       Physical Exam HENT:     Head: Normocephalic.     Mouth/Throat:     Pharynx: No oropharyngeal exudate.  Eyes:     General: Lids are normal.     Conjunctiva/sclera: Conjunctivae normal.  Cardiovascular:     Rate and Rhythm: Normal rate and regular rhythm.     Heart sounds: Normal heart sounds, S1 normal and S2 normal.  Pulmonary:     Breath sounds: Examination of the right-lower field reveals decreased breath sounds. Examination of the left-lower field reveals decreased breath sounds. Decreased breath sounds present. No wheezing, rhonchi or rales.  Abdominal:     Palpations: Abdomen is soft.     Tenderness: There is no abdominal tenderness.  Musculoskeletal:     Right lower leg: Swelling present.     Left lower leg: Swelling present.  Skin:    General: Skin is warm.     Comments: Large wounds posterior right leg.  No bleeding.  Chronic lower extremity discoloration.  Feet cool to touch and small area brownish/blackish area under right first toe and foot.  Able to doppler pulses.  Neurological:     Mental Status: He is alert and oriented to person, place, and time.     Data Reviewed: Platelet 433, white blood cell count 16.2, hemoglobin 15.8, creatinine 0.93, CO2 35, sodium 133  Family Communication: Updated patient's wife on the phone  Disposition: Status is: Inpatient Remains inpatient appropriate because:   Planned Discharge Destination: Home with Home health    Time spent: 28 minutes Case discussed with vascular surgery  Author: Alford Highland, MD 02/26/2023 4:07 PM  For on call review [email or web address].

## 2023-02-26 NOTE — Plan of Care (Signed)

## 2023-02-26 NOTE — Consult Note (Addendum)
ANTICOAGULATION CONSULT NOTE - Initial Consult  Pharmacy Consult for Heparin infusion Indication:  PVD  Allergies  Allergen Reactions   Benadryl [Diphenhydramine Hcl] Shortness Of Breath   Morphine And Codeine Swelling    Difficulty breathing per patient    Oxycodone Swelling    Severe mouth swelling requiring medical intervention    Patient Measurements: Height: 5\' 10"  (177.8 cm) Weight: 100.7 kg (222 lb) IBW/kg (Calculated) : 73 Heparin Dosing Weight: 94.1 kg  Vital Signs: Temp: 97.6 F (36.4 C) (07/21 0725) BP: 106/73 (07/21 0725) Pulse Rate: 73 (07/21 0725)  Labs: Recent Labs    02/24/23 0857 02/25/23 0445 02/26/23 0450  HGB 16.0  --  15.8  HCT 50.3  --  50.6  PLT 427*  --  433*  CREATININE 0.79 0.96 0.93    Estimated Creatinine Clearance: 90.4 mL/min (by C-G formula based on SCr of 0.93 mg/dL).   Medical History: Past Medical History:  Diagnosis Date   Arthritis    Asthma    Atrial fibrillation (HCC)    CHF (congestive heart failure) (HCC)    COPD (chronic obstructive pulmonary disease) (HCC)    Coronary artery disease    GERD (gastroesophageal reflux disease)    Hypertension    Myocardial infarction (HCC)    Shortness of breath dyspnea     Pertinent Medications:  No history of chronic anticoagulant use PTA  Assessment: 67 y.o. male with medical history significant of paroxysmal atrial fibrillation not on anticoagulation, chronic systolic congestive heart failure with ejection fraction 35 to 40%, moderate pulm hypertension, COPD, chronic hypoxemia on 4 L oxygen, coronary disease, essential hypertension, who present to the hospital with complaints of worsening wounds in the right leg. Baseline labs are ordered and pending.  Goal of Therapy:  Heparin level 0.3-0.7 units/ml Monitor platelets by anticoagulation protocol: Yes   Plan:  Give 4500 units bolus x 1 Start heparin infusion at 1350 units/hr Check anti-Xa level in 6 hours and daily while on  heparin Continue to monitor H&H and platelets  Fontaine Kossman A Everlina Gotts 02/26/2023,1:19 PM

## 2023-02-27 DIAGNOSIS — S22080S Wedge compression fracture of T11-T12 vertebra, sequela: Secondary | ICD-10-CM | POA: Diagnosis not present

## 2023-02-27 DIAGNOSIS — S32050S Wedge compression fracture of fifth lumbar vertebra, sequela: Secondary | ICD-10-CM | POA: Diagnosis not present

## 2023-02-27 DIAGNOSIS — M25562 Pain in left knee: Secondary | ICD-10-CM

## 2023-02-27 DIAGNOSIS — S32030S Wedge compression fracture of third lumbar vertebra, sequela: Secondary | ICD-10-CM | POA: Diagnosis not present

## 2023-02-27 DIAGNOSIS — M549 Dorsalgia, unspecified: Secondary | ICD-10-CM | POA: Diagnosis not present

## 2023-02-27 LAB — BASIC METABOLIC PANEL
Anion gap: 6 (ref 5–15)
BUN: 27 mg/dL — ABNORMAL HIGH (ref 8–23)
CO2: 38 mmol/L — ABNORMAL HIGH (ref 22–32)
Calcium: 8.5 mg/dL — ABNORMAL LOW (ref 8.9–10.3)
Chloride: 88 mmol/L — ABNORMAL LOW (ref 98–111)
Creatinine, Ser: 0.85 mg/dL (ref 0.61–1.24)
GFR, Estimated: >60 mL/min (ref >60)
Glucose, Bld: 108 mg/dL — ABNORMAL HIGH (ref 70–99)
Potassium: 4.2 mmol/L (ref 3.5–5.1)
Sodium: 132 mmol/L — ABNORMAL LOW (ref 135–145)

## 2023-02-27 LAB — HEPARIN LEVEL (UNFRACTIONATED)
Heparin Unfractionated: 0.7 IU/mL (ref 0.30–0.70)
Heparin Unfractionated: 0.64 IU/mL (ref 0.30–0.70)
Heparin Unfractionated: 0.66 IU/mL (ref 0.30–0.70)

## 2023-02-27 LAB — CBC
HCT: 46.3 % (ref 39.0–52.0)
Hemoglobin: 14.3 g/dL (ref 13.0–17.0)
MCH: 27.9 pg (ref 26.0–34.0)
MCHC: 30.9 g/dL (ref 30.0–36.0)
MCV: 90.4 fL (ref 80.0–100.0)
Platelets: 348 10*3/uL (ref 150–400)
RBC: 5.12 MIL/uL (ref 4.22–5.81)
RDW: 13.8 % (ref 11.5–15.5)
WBC: 15.9 10*3/uL — ABNORMAL HIGH (ref 4.0–10.5)
nRBC: 0 % (ref 0.0–0.2)

## 2023-02-27 LAB — URIC ACID: Uric Acid, Serum: 5.9 mg/dL (ref 3.7–8.6)

## 2023-02-27 MED ORDER — TORSEMIDE 20 MG PO TABS
10.0000 mg | ORAL_TABLET | Freq: Every day | ORAL | Status: DC
  Administered 2023-03-01: 10 mg via ORAL
  Filled 2023-02-27 (×2): qty 1

## 2023-02-27 MED ORDER — COLCHICINE 0.6 MG PO TABS
0.6000 mg | ORAL_TABLET | Freq: Once | ORAL | Status: AC
  Administered 2023-02-27: 0.6 mg via ORAL
  Filled 2023-02-27: qty 1

## 2023-02-27 MED ORDER — METHYLPREDNISOLONE SODIUM SUCC 40 MG IJ SOLR
20.0000 mg | Freq: Every day | INTRAMUSCULAR | Status: DC
  Administered 2023-02-27 – 2023-02-28 (×2): 20 mg via INTRAVENOUS
  Filled 2023-02-27 (×2): qty 1

## 2023-02-27 NOTE — Plan of Care (Signed)

## 2023-02-27 NOTE — Consult Note (Signed)
ANTICOAGULATION CONSULT NOTE -   Pharmacy Consult for Heparin infusion Indication:  PVD  Allergies  Allergen Reactions   Benadryl [Diphenhydramine Hcl] Shortness Of Breath   Morphine And Codeine Swelling    Difficulty breathing per patient    Oxycodone Swelling    Severe mouth swelling requiring medical intervention    Patient Measurements: Height: 5\' 10"  (177.8 cm) Weight: 100.7 kg (222 lb) IBW/kg (Calculated) : 73 Heparin Dosing Weight: 94.1 kg  Vital Signs: Temp: 97.9 F (36.6 C) (07/22 0736) BP: 117/80 (07/22 0736) Pulse Rate: 83 (07/22 0736)  Labs: Recent Labs    02/25/23 0445 02/26/23 0450 02/26/23 1239 02/26/23 1457 02/26/23 1826 02/27/23 0111 02/27/23 0547 02/27/23 0906  HGB  --  15.8  --   --   --   --  14.3  --   HCT  --  50.6  --   --   --   --  46.3  --   PLT  --  433*  --   --   --   --  348  --   APTT  --   --  >200* 164*  --   --   --   --   LABPROT  --   --  14.5  --   --   --   --   --   INR  --   --  1.1  --   --   --   --   --   HEPARINUNFRC  --   --   --   --  0.98* 0.70  --  0.64  CREATININE 0.96 0.93  --   --   --   --  0.85  --     Estimated Creatinine Clearance: 98.9 mL/min (by C-G formula based on SCr of 0.85 mg/dL).   Medical History: Past Medical History:  Diagnosis Date   Arthritis    Asthma    Atrial fibrillation (HCC)    CHF (congestive heart failure) (HCC)    COPD (chronic obstructive pulmonary disease) (HCC)    Coronary artery disease    GERD (gastroesophageal reflux disease)    Hypertension    Myocardial infarction (HCC)    Shortness of breath dyspnea    Pertinent Medications:  No history of chronic anticoagulant use PTA  Assessment: 67 y.o. male with medical history significant of paroxysmal atrial fibrillation not on anticoagulation, chronic systolic congestive heart failure with ejection fraction 35 to 40%, moderate pulm hypertension, COPD, chronic hypoxemia on 4 L oxygen, coronary disease, essential hypertension,  who present to the hospital with complaints of worsening wounds in the right leg. Baseline labs are ordered and pending.  Goal of Therapy:  Heparin level 0.3-0.7 units/ml Monitor platelets by anticoagulation protocol: Yes   7/21 1826 HL 0.98, supratherapeutic @ 1350 u/hr>1200 7/22 0111 HL 0.70,  therapeutic-upper end, 1200>1150 u/hr 7/22 0906 HL 0.64, therapeutic @ 1150 units/hour  Plan:  7/22 0906 HL 0.64, therapeutic Continue heparin infusion at 1150 units/hour Check confirmatory heparin level in 6 hours Monitor daily Anti-Xa levels Daily CBC while on heparin F/u plans to restart asa/plavix after IR procedure on 7/23  Erik [name] 02/27/2023,10:01 AM

## 2023-02-27 NOTE — Care Management Important Message (Signed)
Important Message  Patient Details  Name: Erik [name] MRN: [id number] Date of Birth: Feb 14, 1956   Medicare Important Message Given:  Yes     Johnell Comings 02/27/2023, 11:26 AM

## 2023-02-27 NOTE — Progress Notes (Signed)
PT Cancellation Note  Patient Details Name: Erik [name] MRN: [id number] DOB: January 25, 1956   Cancelled Treatment:    Reason Eval/Treat Not Completed: Other (comment).  Pt scheduled to have kyphoplasty tomorrow and pt continues to be in too much pain to participate well with therapy at this time.  Will hold PT at this time and await new orders for PT.     Nolon Bussing, PT, DPT Physical Therapist - [facility]  02/27/23, 11:44 AM

## 2023-02-27 NOTE — Consult Note (Signed)
ANTICOAGULATION CONSULT NOTE  Pharmacy Consult for Heparin infusion Indication:  PVD  Allergies  Allergen Reactions   Benadryl [Diphenhydramine Hcl] Shortness Of Breath   Morphine And Codeine Swelling    Difficulty breathing per patient    Oxycodone Swelling    Severe mouth swelling requiring medical intervention    Patient Measurements: Height: 5\' 10"  (177.8 cm) Weight: 100.7 kg (222 lb) IBW/kg (Calculated) : 73 Heparin Dosing Weight: 94.1 kg  Vital Signs: Temp: 97.9 F (36.6 C) (07/22 0736) BP: 117/80 (07/22 0736) Pulse Rate: 83 (07/22 0736)  Labs: Recent Labs    02/25/23 0445 02/26/23 0450 02/26/23 1239 02/26/23 1457 02/26/23 1826 02/27/23 0111 02/27/23 0547 02/27/23 0906  HGB  --  15.8  --   --   --   --  14.3  --   HCT  --  50.6  --   --   --   --  46.3  --   PLT  --  433*  --   --   --   --  348  --   APTT  --   --  >200* 164*  --   --   --   --   LABPROT  --   --  14.5  --   --   --   --   --   INR  --   --  1.1  --   --   --   --   --   HEPARINUNFRC  --   --   --   --  0.98* 0.70  --  0.64  CREATININE 0.96 0.93  --   --   --   --  0.85  --     Estimated Creatinine Clearance: 98.9 mL/min (by C-G formula based on SCr of 0.85 mg/dL).   Medical History: Past Medical History:  Diagnosis Date   Arthritis    Asthma    Atrial fibrillation (HCC)    CHF (congestive heart failure) (HCC)    COPD (chronic obstructive pulmonary disease) (HCC)    Coronary artery disease    GERD (gastroesophageal reflux disease)    Hypertension    Myocardial infarction (HCC)    Shortness of breath dyspnea    Pertinent Medications:  No history of chronic anticoagulant use PTA  Assessment: 67 y.o. male with medical history significant of paroxysmal atrial fibrillation not on anticoagulation, chronic systolic congestive heart failure with ejection fraction 35 to 40%, moderate pulm hypertension, COPD, chronic hypoxemia on 4 L oxygen, coronary disease, essential hypertension,  who present to the hospital with complaints of worsening wounds in the right leg. Baseline labs are ordered and pending.  Goal of Therapy:  Heparin level 0.3-0.7 units/ml Monitor platelets by anticoagulation protocol: Yes   7/21 1826 HL 0.98, supratherapeutic @ 1350 u/hr>1200 7/22 0111 HL 0.70,  therapeutic-upper end, 1200>1150 u/hr 7/22 0906 HL 0.64, therapeutic @ 1150 units/hour  Plan: heparin level therapeutic x 2 Continue heparin infusion at 1150 units/hour Check next heparin level in am Monitor daily Anti-Xa levels Daily CBC while on heparin F/u plans to restart asa/plavix after IR procedure on 7/23  Lowella Bandy 02/27/2023,12:12 PM

## 2023-02-27 NOTE — Progress Notes (Addendum)
Progress Note   Patient: Erik [name] HQI:[id number] DOB: 1956-07-13 DOA: 02/16/2023     11 DOS: the patient was seen and examined on 02/27/2023   Brief hospital course: Erik [name] is a 67 y.o. male with medical history significant of paroxysmal atrial fibrillation not on anticoagulation, chronic systolic congestive heart failure with ejection fraction 35 to 40%, moderate pulm hypertension, COPD, chronic hypoxemia on 4 L oxygen, coronary disease, essential hypertension, who present to the hospital with complaints of worsening wounds in the right leg.  Patient is treated with vancomycin and Rocephin for cellulitis and wound infection. Patient was also treated with IV steroids for COPD exacerbation.  CT chest ruled out PE. Condition had improved, antibiotic changed to oral.  Steroids dose tapering down.  Patient also has compressive fracture in T11 and L3, does not appear to be acute.  Neurosurgery consult obtained due to persistent pain.  7/17.  Patient having a lot of pain still and having difficulty breathing secondary to the pain.  MRI of the thoracic and lumbar spine showing T11, L3 and L5 compression fractures. 7/18.  Held aspirin and Plavix for potential kyphoplasty's on Tuesday. 7/19.  Patient still with back pain and difficulty moving around.  Feels his breathing has been better since yesterday when he coughed up some phlegm. 7/20.  Patient was coughing this morning coughed up a little bit of blood.  Family wanted me to test for COVID and that was negative.  Seen this morning while trying to stand and getting into the chair needing quite a bit of support with nursing staff. 7/21.  Noticed some blackish brownish discoloration under his first toe on the right foot and on his foot below the fifth/fourth toe.  Will start heparin drip to thin the blood and while off his aspirin and Plavix.  Spoke with vascular surgery. 7/22.  Patient complaining of left knee pain today.  Difficulty moving his  knee.  He does not think he has a history of gout.  Breathing is okay.  No coughing up blood.  No pain in his feet.  Assessment and Plan: * Severe back pain T11, L3 and L5 compression fractures.  Pain control.  Physical therapy recommending rehab.  Interventional radiology scheduled kyphoplasty's on Tuesday 7/23.  Needs to be off aspirin and Plavix till then.  Heparin drip will stop at 9 AM for procedure tomorrow.  Chronic respiratory failure with hypoxia (HCC) Baseline 2 L nasal cannula.  Nursing staff looks like increased up to 4 L.  Pulse ox is good and should be able to taper back down to 2 L.  COPD exacerbation (HCC) Chronic respiratory failure on oxygen 2 L.  Completed prednisone.  Continue inhalers and nebulizers.  Breathing better since he coughed up some phlegm.  Currently back to baseline oxygen 2 L as of this morning but looks like they increased his oxygen this afternoon.  Acute pain of left knee Could be gout even though uric acid not high.  Dose of Solu-Medrol given today.  Will give a dose of colchicine also.  Hemoptysis Only slight.  Likely from coughing.  Will give cough medication Robitussin and Tessalon Perles.  Patient will end up needing a repeat CT scan in 2 to 3 months.  Aspirin and Plavix on hold.  No hemoptysis while on heparin drip.  Sepsis (HCC) Present on admission with right lower extremity cellulitis.  Patient had tachycardia tachypnea and leukocytosis and mildly elevated lactic acid.  Completed doxycycline.  Venous ulcer (HCC)  Will need follow-up at the wound care center.  PAD (peripheral artery disease) (HCC) Holding aspirin and Plavix for kyphoplasty.  With some skin changes on the right foot I started heparin drip on 7/21 for better blood supply.  We were able to Doppler pulses.   Paroxysmal atrial fibrillation with RVR (HCC) Patient on metoprolol.  Not on major anticoagulation but I put on heparin drip for now until patient can go back on aspirin and  Plavix.  Chronic systolic CHF (congestive heart failure) (HCC) Last EF 35% patient on Toprol, Demadex, Farxiga and spironolactone.  Obesity (BMI 30-39.9) BMI 31.85  Multiple lung nodules Recommend repeating a CT scan in a few months to ensure clearing.        Subjective: Patient complaining of left knee pain.  No history of gout.  Patient will be set up for kyphoplasty tomorrow.  Physical Exam: Vitals:   02/26/23 1957 02/27/23 0016 02/27/23 0736 02/27/23 1605  BP:  115/73 117/80   Pulse:  93 83 79  Resp:  20 17 18   Temp:  97.9 F (36.6 C) 97.9 F (36.6 C) 97.6 F (36.4 C)  TempSrc:    Oral  SpO2: 95% 90% 98%   Weight:      Height:       Physical Exam HENT:     Head: Normocephalic.     Mouth/Throat:     Pharynx: No oropharyngeal exudate.  Eyes:     General: Lids are normal.     Conjunctiva/sclera: Conjunctivae normal.  Cardiovascular:     Rate and Rhythm: Normal rate and regular rhythm.     Heart sounds: Normal heart sounds, S1 normal and S2 normal.  Pulmonary:     Breath sounds: Examination of the right-lower field reveals decreased breath sounds. Examination of the left-lower field reveals decreased breath sounds. Decreased breath sounds present. No wheezing, rhonchi or rales.  Abdominal:     Palpations: Abdomen is soft.     Tenderness: There is no abdominal tenderness.  Musculoskeletal:     Left knee: Swelling present. Decreased range of motion.     Right lower leg: Swelling present.     Left lower leg: Swelling present.  Skin:    General: Skin is warm.     Comments: Large wounds posterior right leg.  No bleeding.  Chronic lower extremity discoloration.  Feet cool to touch and small area brownish/blackish area under right first toe and foot.  Able to doppler pulses.  Neurological:     Mental Status: He is alert and oriented to person, place, and time.     Data Reviewed: Sodium 132, creatinine 0.85, CO2 38, uric acid 5.9, white blood cell count 15.8,  hemoglobin 14.3, platelet count 348  Family Communication: Updated patient's wife on the phone  Disposition: Status is: Inpatient Remains inpatient appropriate because: for kyphoplasty tomorrow  Planned Discharge Destination: Home with Home Health    Time spent: 28 minutes  Author: Alford Highland, MD 02/27/2023 4:07 PM  For on call review [email or web address].

## 2023-02-27 NOTE — Progress Notes (Signed)
Patient ID: Erik [name], male   DOB: [birth year], 67 y.o.   MRN: [id number]   Pt seen today on the floor.  KP procedure reviewed in detail.  His back pain is no better over the weekend and after our discussion he does wish to proceed with the 3 level KP intervention.  He is scheduled for tomorrow in IR with Dr Grace Isaac who has reviewed the case and agrees.  All questions addressed.  Consent obtained.

## 2023-02-27 NOTE — Plan of Care (Signed)
  Problem: Fluid Volume: Goal: Hemodynamic stability will improve Outcome: Progressing   Problem: Clinical Measurements: Goal: Diagnostic test results will improve Outcome: Progressing   Problem: Education: Goal: Knowledge of General Education information will improve Description: Including pain rating scale, medication(s)/side effects and non-pharmacologic comfort measures Outcome: Progressing   Problem: Health Behavior/Discharge Planning: Goal: Ability to manage health-related needs will improve Outcome: Progressing   Problem: Clinical Measurements: Goal: Ability to maintain clinical measurements within normal limits will improve Outcome: Progressing

## 2023-02-27 NOTE — Consult Note (Addendum)
ANTICOAGULATION CONSULT NOTE -   Pharmacy Consult for Heparin infusion Indication:  PVD , empiric coverage for PE  Allergies  Allergen Reactions   Benadryl [Diphenhydramine Hcl] Shortness Of Breath   Morphine And Codeine Swelling    Difficulty breathing per patient    Oxycodone Swelling    Severe mouth swelling requiring medical intervention    Patient Measurements: Height: 5\' 10"  (177.8 cm) Weight: 100.7 kg (222 lb) IBW/kg (Calculated) : 73 Heparin Dosing Weight: 94.1 kg  Vital Signs: Temp: 97.9 F (36.6 C) (07/22 0016) BP: 115/73 (07/22 0016) Pulse Rate: 93 (07/22 0016)  Labs: Recent Labs    02/24/23 0857 02/25/23 0445 02/26/23 0450 02/26/23 1239 02/26/23 1457 02/26/23 1826 02/27/23 0111  HGB 16.0  --  15.8  --   --   --   --   HCT 50.3  --  50.6  --   --   --   --   PLT 427*  --  433*  --   --   --   --   APTT  --   --   --  >200* 164*  --   --   LABPROT  --   --   --  14.5  --   --   --   INR  --   --   --  1.1  --   --   --   HEPARINUNFRC  --   --   --   --   --  0.98* 0.70  CREATININE 0.79 0.96 0.93  --   --   --   --     Estimated Creatinine Clearance: 90.4 mL/min (by C-G formula based on SCr of 0.93 mg/dL).   Medical History: Past Medical History:  Diagnosis Date   Arthritis    Asthma    Atrial fibrillation (HCC)    CHF (congestive heart failure) (HCC)    COPD (chronic obstructive pulmonary disease) (HCC)    Coronary artery disease    GERD (gastroesophageal reflux disease)    Hypertension    Myocardial infarction (HCC)    Shortness of breath dyspnea     Pertinent Medications:  No history of chronic anticoagulant use PTA  Assessment: 67 y.o. male with medical history significant of paroxysmal atrial fibrillation not on anticoagulation, chronic systolic congestive heart failure with ejection fraction 35 to 40%, moderate pulm hypertension, COPD, chronic hypoxemia on 4 L oxygen, coronary disease, essential hypertension, who present to the  hospital with complaints of worsening wounds in the right leg. Baseline labs are ordered and pending.  Goal of Therapy:  Heparin level 0.3-0.7 units/ml Monitor platelets by anticoagulation protocol: Yes   7/21 1826 HL 0.98, supratherapeutic @ 1350 u/hr>1200 7/22 0111 HL 0.70,  therapeutic-upper end, 1200>1150 u/hr  Plan:  7/22 0111 HL 0.70,  therapeutic-upper end Reduce heparin infusion rate slightly to 1150 units.hr Recheck Anti-Xa level 6 hours after rate change Daily CBC while on heparin  Jaeceon Michelin A 02/27/2023,2:09 AM

## 2023-02-27 NOTE — Assessment & Plan Note (Signed)
Could be gout even though uric acid not high.  Solu-Medrol given yesterday and today.  Will discontinue

## 2023-02-28 ENCOUNTER — Inpatient Hospital Stay: Payer: HMO | Admitting: Certified Registered"

## 2023-02-28 ENCOUNTER — Encounter: Payer: Self-pay | Admitting: Internal Medicine

## 2023-02-28 ENCOUNTER — Inpatient Hospital Stay: Payer: HMO | Admitting: Radiology

## 2023-02-28 DIAGNOSIS — M549 Dorsalgia, unspecified: Secondary | ICD-10-CM | POA: Diagnosis not present

## 2023-02-28 DIAGNOSIS — S22080S Wedge compression fracture of T11-T12 vertebra, sequela: Secondary | ICD-10-CM | POA: Diagnosis not present

## 2023-02-28 DIAGNOSIS — S32030S Wedge compression fracture of third lumbar vertebra, sequela: Secondary | ICD-10-CM | POA: Diagnosis not present

## 2023-02-28 DIAGNOSIS — S32050S Wedge compression fracture of fifth lumbar vertebra, sequela: Secondary | ICD-10-CM | POA: Diagnosis not present

## 2023-02-28 DIAGNOSIS — I2510 Atherosclerotic heart disease of native coronary artery without angina pectoris: Secondary | ICD-10-CM | POA: Diagnosis not present

## 2023-02-28 DIAGNOSIS — I5022 Chronic systolic (congestive) heart failure: Secondary | ICD-10-CM | POA: Diagnosis not present

## 2023-02-28 DIAGNOSIS — I4891 Unspecified atrial fibrillation: Secondary | ICD-10-CM

## 2023-02-28 DIAGNOSIS — I480 Paroxysmal atrial fibrillation: Secondary | ICD-10-CM | POA: Diagnosis not present

## 2023-02-28 HISTORY — PX: IR KYPHO THORACIC WITH BONE BIOPSY: IMG5518

## 2023-02-28 LAB — CBC
HCT: 45.2 % (ref 39.0–52.0)
Hemoglobin: 14.6 g/dL (ref 13.0–17.0)
MCH: 28.8 pg (ref 26.0–34.0)
MCHC: 32.3 g/dL (ref 30.0–36.0)
MCV: 89.2 fL (ref 80.0–100.0)
Platelets: 352 10*3/uL (ref 150–400)
RBC: 5.07 MIL/uL (ref 4.22–5.81)
RDW: 13.9 % (ref 11.5–15.5)
WBC: 20.4 10*3/uL — ABNORMAL HIGH (ref 4.0–10.5)
nRBC: 0 % (ref 0.0–0.2)

## 2023-02-28 LAB — TROPONIN I (HIGH SENSITIVITY)
Troponin I (High Sensitivity): 23 ng/L — ABNORMAL HIGH (ref <18)
Troponin I (High Sensitivity): 24 ng/L — ABNORMAL HIGH (ref <18)

## 2023-02-28 LAB — BASIC METABOLIC PANEL
Anion gap: 8 (ref 5–15)
BUN: 23 mg/dL (ref 8–23)
CO2: 31 mmol/L (ref 22–32)
Calcium: 8.5 mg/dL — ABNORMAL LOW (ref 8.9–10.3)
Chloride: 94 mmol/L — ABNORMAL LOW (ref 98–111)
Creatinine, Ser: 0.89 mg/dL (ref 0.61–1.24)
GFR, Estimated: >60 mL/min (ref >60)
Glucose, Bld: 160 mg/dL — ABNORMAL HIGH (ref 70–99)
Potassium: 5.4 mmol/L — ABNORMAL HIGH (ref 3.5–5.1)
Sodium: 133 mmol/L — ABNORMAL LOW (ref 135–145)

## 2023-02-28 LAB — MAGNESIUM: Magnesium: 2.5 mg/dL — ABNORMAL HIGH (ref 1.7–2.4)

## 2023-02-28 LAB — HEPARIN LEVEL (UNFRACTIONATED): Heparin Unfractionated: 0.53 IU/mL (ref 0.30–0.70)

## 2023-02-28 MED ORDER — ENOXAPARIN SODIUM 40 MG/0.4ML IJ SOSY: 40.0000 mg | PREFILLED_SYRINGE | INTRAMUSCULAR | Status: DC

## 2023-02-28 MED ORDER — FENTANYL CITRATE (PF) 100 MCG/2ML IJ SOLN
INTRAMUSCULAR | Status: DC | PRN
  Administered 2023-02-28: 12.5 ug via INTRAVENOUS
  Administered 2023-02-28: 25 ug via INTRAVENOUS

## 2023-02-28 MED ORDER — FENTANYL CITRATE (PF) 100 MCG/2ML IJ SOLN
INTRAMUSCULAR | Status: AC
  Filled 2023-02-28: qty 2

## 2023-02-28 MED ORDER — LIDOCAINE-EPINEPHRINE 1 %-1:100000 IJ SOLN
INTRAMUSCULAR | Status: AC
  Filled 2023-02-28: qty 1

## 2023-02-28 MED ORDER — METOPROLOL TARTRATE 5 MG/5ML IV SOLN
INTRAVENOUS | Status: AC
  Filled 2023-02-28: qty 5

## 2023-02-28 MED ORDER — PROPOFOL 1000 MG/100ML IV EMUL
INTRAVENOUS | Status: AC
  Filled 2023-02-28: qty 100

## 2023-02-28 MED ORDER — ENOXAPARIN SODIUM 60 MG/0.6ML IJ SOSY
0.5000 mg/kg | PREFILLED_SYRINGE | INTRAMUSCULAR | Status: DC
  Filled 2023-02-28: qty 0.6

## 2023-02-28 MED ORDER — ALUM & MAG HYDROXIDE-SIMETH 200-200-20 MG/5ML PO SUSP: 30.0000 mL | ORAL | Status: DC | PRN

## 2023-02-28 MED ORDER — LABETALOL HCL 5 MG/ML IV SOLN
INTRAVENOUS | Status: DC | PRN
  Administered 2023-02-28: 5 mg via INTRAVENOUS

## 2023-02-28 MED ORDER — LABETALOL HCL 5 MG/ML IV SOLN
5.0000 mg | Freq: Once | INTRAVENOUS | Status: AC
  Administered 2023-02-28: 5 mg via INTRAVENOUS

## 2023-02-28 MED ORDER — ASPIRIN 81 MG PO TBEC: 81.0000 mg | DELAYED_RELEASE_TABLET | Freq: Every day | ORAL | Status: DC

## 2023-02-28 MED ORDER — LIDOCAINE HCL (CARDIAC) PF 100 MG/5ML IV SOSY
PREFILLED_SYRINGE | INTRAVENOUS | Status: DC | PRN
  Administered 2023-02-28: 20 mg via INTRAVENOUS

## 2023-02-28 MED ORDER — PROPOFOL 500 MG/50ML IV EMUL
INTRAVENOUS | Status: DC | PRN
  Administered 2023-02-28: 100 ug/kg/min via INTRAVENOUS

## 2023-02-28 MED ORDER — ASPIRIN 81 MG PO TBEC
81.0000 mg | DELAYED_RELEASE_TABLET | Freq: Every day | ORAL | Status: DC
  Administered 2023-03-01 – 2023-03-04 (×4): 81 mg via ORAL
  Filled 2023-02-28 (×4): qty 1

## 2023-02-28 MED ORDER — SODIUM CHLORIDE 0.9 % IV SOLN: INTRAVENOUS | Status: DC | PRN

## 2023-02-28 MED ORDER — METOPROLOL TARTRATE 5 MG/5ML IV SOLN
INTRAVENOUS | Status: DC | PRN
  Administered 2023-02-28 (×3): 1 mg via INTRAVENOUS

## 2023-02-28 MED ORDER — CEFAZOLIN SODIUM-DEXTROSE 2-4 GM/100ML-% IV SOLN
2.0000 g | INTRAVENOUS | Status: AC
  Administered 2023-02-28: 2 g via INTRAVENOUS

## 2023-02-28 MED ORDER — ENOXAPARIN SODIUM 60 MG/0.6ML IJ SOSY
0.5000 mg/kg | PREFILLED_SYRINGE | INTRAMUSCULAR | Status: DC
  Administered 2023-03-01 – 2023-03-02 (×2): 50 mg via SUBCUTANEOUS
  Filled 2023-02-28 (×4): qty 0.6

## 2023-02-28 MED ORDER — CLOPIDOGREL BISULFATE 75 MG PO TABS
75.0000 mg | ORAL_TABLET | Freq: Every day | ORAL | Status: DC
  Administered 2023-03-01 – 2023-03-02 (×2): 75 mg via ORAL
  Filled 2023-02-28 (×3): qty 1

## 2023-02-28 MED ORDER — LIDOCAINE-EPINEPHRINE 1 %-1:100000 IJ SOLN
6.0000 mL | Freq: Once | INTRAMUSCULAR | Status: AC
  Administered 2023-02-28: 6 mL

## 2023-02-28 MED ORDER — LABETALOL HCL 5 MG/ML IV SOLN
INTRAVENOUS | Status: AC
  Filled 2023-02-28: qty 4

## 2023-02-28 MED ORDER — PROPOFOL 10 MG/ML IV BOLUS
INTRAVENOUS | Status: AC
  Filled 2023-02-28: qty 20

## 2023-02-28 MED ORDER — LIDOCAINE HCL (PF) 1 % IJ SOLN
INTRAMUSCULAR | Status: AC
  Filled 2023-02-28: qty 30

## 2023-02-28 MED ORDER — LIDOCAINE HCL (PF) 1 % IJ SOLN
12.0000 mL | Freq: Once | INTRAMUSCULAR | Status: AC
  Administered 2023-02-28: 12 mL
  Filled 2023-02-28: qty 12

## 2023-02-28 MED ORDER — FENTANYL CITRATE (PF) 100 MCG/2ML IJ SOLN
25.0000 ug | INTRAMUSCULAR | Status: DC | PRN
  Administered 2023-02-28 (×2): 25 ug via INTRAVENOUS

## 2023-02-28 MED ORDER — PROPOFOL 10 MG/ML IV BOLUS
INTRAVENOUS | Status: DC | PRN
  Administered 2023-02-28: 30 mg via INTRAVENOUS
  Administered 2023-02-28: 20 mg via INTRAVENOUS

## 2023-02-28 MED ORDER — NITROGLYCERIN 0.4 MG SL SUBL
0.4000 mg | SUBLINGUAL_TABLET | SUBLINGUAL | Status: DC | PRN
  Administered 2023-02-28: 0.4 mg via SUBLINGUAL
  Filled 2023-02-28: qty 1

## 2023-02-28 MED ORDER — LIDOCAINE HCL (PF) 2 % IJ SOLN
INTRAMUSCULAR | Status: AC
  Filled 2023-02-28: qty 5

## 2023-02-28 MED ORDER — CEFAZOLIN SODIUM-DEXTROSE 2-4 GM/100ML-% IV SOLN
INTRAVENOUS | Status: AC
  Filled 2023-02-28: qty 100

## 2023-02-28 MED ORDER — METOPROLOL TARTRATE 5 MG/5ML IV SOLN: 5.0000 mg | INTRAVENOUS | Status: DC | PRN

## 2023-02-28 NOTE — Procedures (Signed)
Pre procedural Dx: Symptomatic compression fractures Post procedural Dx: Same  Technically successful cement augmentation of the T11, L3 and L5 vertebral bodies  Patient is to lie flat for 3 hours.  EBL: Trace Complications: Patient was sedated by the Anesthesia service for the procedure and was noted to have sustained hypercarbia and tachycardia ultimately necessitating the abrupt completion of the procedure.  Patient was recovery and further resuscitated in the PACU following the procedure.   Katherina Right, MD Pager #: [phone]

## 2023-02-28 NOTE — Progress Notes (Signed)
Progress Note   Patient: Erik [name] MVH:[id number] DOB: 1956/03/11 DOA: 02/16/2023     12 DOS: the patient was seen and examined on 02/28/2023   Brief hospital course: JUSIAH AGUAYO is a 67 y.o. male with medical history significant of paroxysmal atrial fibrillation not on anticoagulation, chronic systolic congestive heart failure with ejection fraction 35 to 40%, moderate pulm hypertension, COPD, chronic hypoxemia on 4 L oxygen, coronary disease, essential hypertension, who present to the hospital with complaints of worsening wounds in the right leg.  Patient is treated with vancomycin and Rocephin for cellulitis and wound infection. Patient was also treated with IV steroids for COPD exacerbation.  CT chest ruled out PE. Condition had improved, antibiotic changed to oral.  Steroids dose tapering down.  Patient also has compressive fracture in T11 and L3, does not appear to be acute.  Neurosurgery consult obtained due to persistent pain.  7/17.  Patient having a lot of pain still and having difficulty breathing secondary to the pain.  MRI of the thoracic and lumbar spine showing T11, L3 and L5 compression fractures. 7/18.  Held aspirin and Plavix for potential kyphoplasty's on Tuesday. 7/19.  Patient still with back pain and difficulty moving around.  Feels his breathing has been better since yesterday when he coughed up some phlegm. 7/20.  Patient was coughing this morning coughed up a little bit of blood.  Family wanted me to test for COVID and that was negative.  Seen this morning while trying to stand and getting into the chair needing quite a bit of support with nursing staff. 7/21.  Noticed some blackish brownish discoloration under his first toe on the right foot and on his foot below the fifth/fourth toe.  Will start heparin drip to thin the blood and while off his aspirin and Plavix.  Spoke with vascular surgery. 7/22.  Patient complaining of left knee pain today.  Difficulty moving his  knee.  He does not think he has a history of gout.  Breathing is okay.  No coughing up blood.  No pain in his feet. 7/23.  Left knee pain is better with steroid yesterday and today.  Patient went into atrial fibrillation postprocedure requiring IV meds.  Asked nursing staff to give his Toprol.  Assessment and Plan: * Severe back pain T11, L3 and L5 compression fractures.  Pain control.  Physical therapy recommending rehab.  3 level kyphoplasty today.  Heparin drip will stopped at 9 AM.  Will restart aspirin and Plavix tomorrow morning.  Chronic respiratory failure with hypoxia (HCC) Baseline 2 L nasal cannula.    COPD exacerbation (HCC) Chronic respiratory failure on oxygen 2 L.  Completed prednisone.  Continue inhalers and nebulizers.  Asked nursing staff to taper back to 2 L.  Acute pain of left knee Could be gout even though uric acid not high.  Solu-Medrol given yesterday and today.  Will discontinue  Hemoptysis Only slight.  Likely from coughing.  Will give cough medication Robitussin and Tessalon Perles.  Patient will end up needing a repeat CT scan in 2 to 3 months.  Aspirin and Plavix on hold until tomorrow.  No hemoptysis while on heparin drip.  Sepsis (HCC) Present on admission with right lower extremity cellulitis.  Patient had tachycardia tachypnea and leukocytosis and mildly elevated lactic acid.  Completed doxycycline.  Venous ulcer (HCC) Will need follow-up at the wound care center.  PAD (peripheral artery disease) (HCC) Holding aspirin and Plavix for kyphoplasty.  With some skin changes  on the right foot I started heparin drip on 7/21 for better blood supply.  We were able to Doppler pulses.  Off heparin drip for surgery today since 9 AM.  Restart aspirin and Plavix on 7/24.  Paroxysmal atrial fibrillation with RVR (HCC) Patient on metoprolol.  Went into rapid atrial fibrillation after procedure.  Required a few doses of IV meds.  Asked nursing staff to give the oral  metoprolol.  Chronic systolic CHF (congestive heart failure) (HCC) Last EF 35% patient on Toprol, Demadex, Farxiga and spironolactone.  Obesity (BMI 30-39.9) BMI 31.85  Multiple lung nodules Recommend repeating a CT scan in a few months to ensure clearing.        Subjective: Patient seen this morning and postprocedure in the PACU.  Nursing staff stated that he could not take his meds with a sip of water this morning and needed to eat with that.  Patient went into rapid atrial fibrillation and needed some IV pushes of metoprolol.  Asked nursing staff to give his Toprol.  Will start back on a diet.  Patient feeling okay.  Physical Exam: Vitals:   02/28/23 1615 02/28/23 1617 02/28/23 1620 02/28/23 1630  BP: 94/65  107/83 98/74  Pulse: (!) 101 (!) 105 94 91  Resp: (!) 29  (!) 28 20  Temp:      TempSrc:      SpO2: 93%  92% 94%  Weight:      Height:       Physical Exam HENT:     Head: Normocephalic.     Mouth/Throat:     Pharynx: No oropharyngeal exudate.  Eyes:     General: Lids are normal.     Conjunctiva/sclera: Conjunctivae normal.  Cardiovascular:     Rate and Rhythm: Normal rate and regular rhythm.     Heart sounds: Normal heart sounds, S1 normal and S2 normal.  Pulmonary:     Breath sounds: Examination of the right-lower field reveals decreased breath sounds. Examination of the left-lower field reveals decreased breath sounds. Decreased breath sounds present. No wheezing, rhonchi or rales.  Abdominal:     Palpations: Abdomen is soft.     Tenderness: There is no abdominal tenderness.  Musculoskeletal:     Left knee: Swelling present. Decreased range of motion.     Right lower leg: Swelling present.     Left lower leg: Swelling present.  Skin:    General: Skin is warm.     Comments: Large wounds posterior right leg.  No bleeding.  Chronic lower extremity discoloration.  Feet cool to touch and small area brownish/blackish area under right first toe and foot.  Able to  doppler pulses.  Neurological:     Mental Status: He is alert and oriented to person, place, and time.     Data Reviewed: White blood cell count 20.4, hemoglobin 14.6, platelet count 352  Family Communication: Spoke with wife on the phone  Disposition: Status is: Inpatient Remains inpatient appropriate because: Had 3 level kyphoplasty today  Planned Discharge Destination: Home    Time spent: 28 minutes  Author: Alford Highland, MD 02/28/2023 4:40 PM  For on call review [email or web address].

## 2023-02-28 NOTE — Progress Notes (Signed)
OT Cancellation Note  Patient Details Name: Erik [name] MRN: [id number] DOB: [birth year]   Cancelled Treatment:    Reason Eval/Treat Not Completed: Patient at procedure or test/ unavailable. Per chart plan for Kyphoplasty today. Will hold and await new orders once pt medically appropriate.   Kathie Dike, M.S. OTR/L  02/28/23, 11:03 AM  ascom [phone]

## 2023-02-28 NOTE — Consult Note (Addendum)
Cardiology Consultation   Patient ID: JAIVEON SUPPES MRN: [id number]; DOB: 10-09-1955  Admit date: 02/16/2023 Date of Consult: 02/28/2023  PCP:  Aviva Kluver   [facility] Providers Cardiologist:  Lorine Bears, MD   {  Patient Profile:   Erik [name] is a 67 y.o. male with a hx of CAD, afib, chronic anticoagulation, HFrEF, COPD on home 4L O2, arthritis, HLD who is being seen 02/28/2023 for the evaluation of Afib RVR at the request of Dr. Hilton Sinclair.  History of Present Illness:   Mr. Haskew is followed by Dr. Kirke Corin. H/o CAD s/p BMS 2009. Cardiac cath in 2016 showed patent stent with mild ISR, chroniccally occluded pRCA with good collaterals, moderate mid LAD stenosis and mild Lcx disease.   He was previously seen by [facility] cardiology. He was hospitalized in 2014 with mild acute pancreatitis and anticoagulation. Pradaxa was discontinued at that time due to compliance issues. Echo 2019 showed LVEF 55-60%. Echo in 2021 showed LVEF 40% (outside facility).   Seen by Dr. Kirke Corin in 09/2020 as a new patient noting worsening exertional dyspnea and tachycardia. He noted bilateral lower extremity edema. EKG showed afib RVR. For CHADSVASC of 3 he was started on Eliquis. Metoprolol was added. Echo showed LVEF 40-45%, normal RV function, severely dilated left atrium, moderately dilated RA, mild MR. Dilt was stopped and cath was discussed, but patient declined.   The patient was last seen 12/2021 via telehealth and reported trouble walking, LLE, and chronic SOB. No chest pain reported. He said he stopped Eliquis due to rash on the arms and was not interested in Xarelto. He was OK with taking ASA daily.   The patient was admitted 02/16/23 with worsening right leg wounds and back pain admitted for compression fractures, COPD exacerbation, sepsis, hemoptysis, PAD, venous ulcer, pAfib, and CHF. The patient underwent kyphoplasty 7/23. Post-op he was noted to go into Afib RVR. He reported he felt like he was  having a panic attack. He noted chronic DOE, chest pain while laying flat, no LLE.   EKG shows Afib RVR with new IVCD (?LBBB) vs VT. Heart monitor showing Afib with rates 100-120.   Past Medical History:  Diagnosis Date   Arthritis    Asthma    Atrial fibrillation (HCC)    CHF (congestive heart failure) (HCC)    COPD (chronic obstructive pulmonary disease) (HCC)    Coronary artery disease    GERD (gastroesophageal reflux disease)    Hypertension    Myocardial infarction [facility])    Shortness of breath dyspnea     Past Surgical History:  Procedure Laterality Date   ANGIOPLASTY  2009   CARDIAC CATHETERIZATION N/A 03/18/2015   Procedure: Left Heart Cath with Coronary Angiography;  Surgeon: Lamar Blinks, MD;  Location: [facility] INVASIVE CV LAB;  Service: Cardiovascular;  Laterality: N/A;   CHOLECYSTECTOMY  2005   CORONARY ANGIOGRAM  2009   LOWER EXTREMITY ANGIOGRAPHY Right 08/31/2022   Procedure: Lower Extremity Angiography;  Surgeon: Renford Dills, MD;  Location: [facility] INVASIVE CV LAB;  Service: Cardiovascular;  Laterality: Right;     Home Medications:  Prior to Admission medications   Medication Sig Start Date End Date Taking? Authorizing Provider  acetaminophen (TYLENOL) 325 MG tablet Take 2 tablets (650 mg total) by mouth every 6 (six) hours as needed for mild pain (or Fever >/= 101). 09/07/22  Yes Hollice Espy, MD  albuterol (VENTOLIN HFA) 108 (90 Base) MCG/ACT inhaler Inhale 1 puff into the lungs  every 6 (six) hours as needed for wheezing or shortness of breath. 02/17/22  Yes Lyndon Code, MD  aspirin EC 81 MG tablet Take 1 tablet (81 mg total) by mouth daily. Swallow whole. 09/08/22  Yes Hollice Espy, MD  clopidogrel (PLAVIX) 75 MG tablet Take 1 tablet (75 mg total) by mouth daily. 12/02/22  Yes Georgiana Spinner, NP  leptospermum manuka honey (MEDIHONEY) PSTE paste Apply 1 Application topically daily. 07/20/22  Yes Darlin Priestly, MD  metoprolol tartrate (LOPRESSOR) 100 MG  tablet Take 1 tablet (100 mg total) by mouth 2 (two) times daily. 09/07/22  Yes Hollice Espy, MD  oxymetazoline (AFRIN) 0.05 % nasal spray Place 1 spray into both nostrils 2 (two) times daily. 09/07/22  Yes Hollice Espy, MD  polyethylene glycol (MIRALAX / GLYCOLAX) 17 g packet Take 17 g by mouth daily. 09/08/22  Yes Hollice Espy, MD  torsemide (DEMADEX) 20 MG tablet Take 2 tablets (40 mg total) by mouth 2 (two) times daily for 3 days, THEN 2 tablets (40 mg total) daily. 09/08/22 02/16/23 Yes Hollice Espy, MD  TRELEGY ELLIPTA 100-62.5-25 MCG/ACT AEPB INHALE 1 PUFF BY MOUTH INTO LUNGS DAILY 05/25/22  Yes Abernathy, Alyssa, NP  gabapentin (NEURONTIN) 100 MG capsule Take 100 mg by mouth 3 (three) times daily. Patient not taking: Reported on 02/16/2023 10/15/22   [provider]  hydrOXYzine (ATARAX) 25 MG tablet Take 1 tablet (25 mg total) by mouth 3 (three) times daily as needed for anxiety. Patient not taking: Reported on 02/16/2023 07/19/22   Darlin Priestly, MD  ipratropium-albuterol (DUONEB) 0.5-2.5 (3) MG/3ML SOLN USE 1 VIAL IN NEBULIZER EVERY 6 HOURS Patient not taking: Reported on 02/16/2023 01/27/23   Sallyanne Kuster, NP  traMADol (ULTRAM) 50 MG tablet Take 50 mg by mouth every 6 (six) hours as needed. Patient not taking: Reported on 02/16/2023 10/12/22   [provider]    Inpatient Medications: Scheduled Meds:  [MAR Hold] benzonatate  100 mg Oral TID   [MAR Hold] dapagliflozin propanediol  10 mg Oral Daily   [MAR Hold] fluticasone furoate-vilanterol  1 puff Inhalation Daily   And   [MAR Hold] umeclidinium bromide  1 puff Inhalation Daily   [MAR Hold] gabapentin  100 mg Oral TID   [MAR Hold] ipratropium-albuterol  3 mL Nebulization TID   labetalol  5 mg Intravenous Once   [MAR Hold] lidocaine  1 patch Transdermal Q24H   [MAR Hold] methylPREDNISolone (SOLU-MEDROL) injection  20 mg Intravenous Daily   [MAR Hold] metoprolol succinate  50 mg Oral Daily   [MAR Hold]  pantoprazole  40 mg Oral Daily   [MAR Hold] polyethylene glycol  17 g Oral BID   [MAR Hold] senna-docusate  2 tablet Oral BID   [MAR Hold] spironolactone  12.5 mg Oral Daily   [MAR Hold] torsemide  10 mg Oral Daily   Continuous Infusions:  PRN Meds: [MAR Hold] acetaminophen, [MAR Hold] albuterol, [MAR Hold] alum & mag hydroxide-simeth, [MAR Hold] fentaNYL (SUBLIMAZE) injection, fentaNYL (SUBLIMAZE) injection, [MAR Hold] guaiFENesin-dextromethorphan, [MAR Hold] hydrOXYzine, [MAR Hold] metoprolol tartrate, [MAR Hold] ondansetron (ZOFRAN) IV, [MAR Hold] traMADol  Allergies:    Allergies  Allergen Reactions   Benadryl [Diphenhydramine Hcl] Shortness Of Breath   Morphine And Codeine Swelling    Difficulty breathing per patient    Oxycodone Swelling    Severe mouth swelling requiring medical intervention    Social History:   Social History   Socioeconomic History   Marital status: Married  Spouse name: Not on file   Number of children: Not on file   Years of education: Not on file   Highest education level: Not on file  Occupational History   Not on file  Tobacco Use   Smoking status: Former    Current packs/day: 0.00    Types: Cigarettes    Start date: 03/20/1972    Quit date: 03/20/1997    Years since quitting: 25.9   Smokeless tobacco: Never  Vaping Use   Vaping status: Never Used  Substance and Sexual Activity   Alcohol use: No   Drug use: No   Sexual activity: Not on file  Other Topics Concern   Not on file  Social History Narrative   Not on file   Social Determinants of Health   Financial Resource Strain: Low Risk  (11/04/2020)   Overall Financial Resource Strain (CARDIA)    Difficulty of Paying Living Expenses: Not hard at all  Food Insecurity: No Food Insecurity (02/16/2023)   Hunger Vital Sign    Worried About Running Out of Food in the Last Year: Never true    Ran Out of Food in the Last Year: Never true  Transportation Needs: No Transportation Needs  (02/16/2023)   PRAPARE - Administrator, Civil Service (Medical): No    Lack of Transportation (Non-Medical): No  Physical Activity: Not on file  Stress: Not on file  Social Connections: Not on file  Intimate Partner Violence: Not At Risk (02/16/2023)   Humiliation, Afraid, Rape, and Kick questionnaire    Fear of Current or Ex-Partner: No    Emotionally Abused: No    Physically Abused: No    Sexually Abused: No    Family History:    Family History  Problem Relation Age of Onset   Coronary artery disease Father    Diabetes Father    Hyperlipidemia Father    Hypertension Father    Stroke Mother    Hyperlipidemia Mother      ROS:  Please see the history of present illness.   All other ROS reviewed and negative.     Physical Exam/Data:   Vitals:   02/28/23 1400 02/28/23 1402 02/28/23 1420 02/28/23 1430  BP:  (!) 114/94 (!) 121/101 (!) 121/107  Pulse: (!) 121  (!) 142 (!) 145  Resp: (!) 27 (!) 22 (!) 23 (!) 32  Temp: (!) 96.8 F (36 C)     TempSrc:      SpO2: 94% 93% 92% 92%  Weight:      Height:        Intake/Output Summary (Last 24 hours) at 02/28/2023 1436 Last data filed at 02/28/2023 1420 Gross per 24 hour  Intake 500 ml  Output 3050 ml  Net -2550 ml      02/28/2023   11:25 AM 02/16/2023    9:42 AM 09/08/2022    8:50 AM  Last 3 Weights  Weight (lbs) 222 lb 0.1 oz 222 lb 245 lb 6 oz  Weight (kg) 100.7 kg 100.699 kg 111.3 kg     Body mass index is 31.85 kg/m.  General:  Well nourished, well developed, in no acute distress HEENT: normal Neck: no JVD Vascular: No carotid bruits; Distal pulses 2+ bilaterally Cardiac:  normal S1, S2; Irreg Irreg; no murmur  Lungs:  clear to auscultation bilaterally, no wheezing, rhonchi or rales  Abd: soft, nontender, no hepatomegaly  Ext: no edema Musculoskeletal:  No deformities, BUE and BLE strength normal and equal Skin:  warm and dry  Neuro:  CNs 2-12 intact, no focal abnormalities noted Psych:  Normal  affect   EKG:  The EKG was personally reviewed and demonstrates:  Afib RVR 132 bpm, new IVCD Telemetry:  Telemetry was personally reviewed and demonstrates:  Afib HR 100-120  Relevant CV Studies:  Echo 08/2022  1. Left ventricular ejection fraction, by estimation, is 35 to 40%. The  left ventricle has moderately decreased function. Left ventricular  endocardial border not optimally defined to evaluate regional wall motion.  Left ventricular diastolic parameters  are indeterminate.   2. Right ventricular systolic function is normal. The right ventricular  size is mildly enlarged. There is moderately elevated pulmonary artery  systolic pressure.   3. Left atrial size was mildly dilated.   4. The mitral valve is abnormal. Mild to moderate mitral valve  regurgitation. No evidence of mitral stenosis. Moderate mitral annular  calcification.   5. The aortic valve is normal in structure. Aortic valve regurgitation is  trivial. Aortic valve sclerosis/calcification is present, without any  evidence of aortic stenosis.   6. The inferior vena cava is dilated in size with <50% respiratory  variability, suggesting right atrial pressure of 15 mmHg.    Echo 01/2022   1. Challenging images   2. Left ventricular ejection fraction, by estimation, is 45 to 50%. The  left ventricle has mildly decreased function. The left ventricle has no  regional wall motion abnormalities. Left ventricular diastolic parameters  are indeterminate.   3. Right ventricular systolic function is normal. The right ventricular  size is normal. Tricuspid regurgitation signal is inadequate for assessing  PA pressure.   4. The mitral valve is normal in structure. Mild mitral valve  regurgitation. No evidence of mitral stenosis.   5. The aortic valve was not well visualized. Aortic valve regurgitation  is not visualized. No aortic stenosis is present.   6. The inferior vena cava is normal in size with greater than 50%   respiratory variability, suggesting right atrial pressure of 3 mmHg.   Echo 2022  1. Left ventricular ejection fraction, by estimation, is 40 to 45%. The  left ventricle has mild to moderately decreased function. Left ventricular  endocardial border not optimally defined to evaluate regional wall motion.  Left ventricular diastolic  parameters are indeterminate.   2. Right ventricular systolic function is low normal. The right  ventricular size is normal.   3. Left atrial size was severely dilated.   4. Right atrial size was moderately dilated.   5. The mitral valve is degenerative. Mild mitral valve regurgitation.   6. The aortic valve was not well visualized. Aortic valve regurgitation  is not visualized. Mild aortic valve sclerosis is present, with no  evidence of aortic valve stenosis.   7. The inferior vena cava is normal in size with greater than 50%  respiratory variability, suggesting right atrial pressure of 3 mmHg.   Laboratory Data:  High Sensitivity Troponin:   Recent Labs  Lab 02/16/23 0943 02/16/23 1307  TROPONINIHS 9 9     Chemistry Recent Labs  Lab 02/25/23 0445 02/26/23 0450 02/27/23 0547  NA 135 133* 132*  K 3.6 4.3 4.2  CL 86* 85* 88*  CO2 42* 35* 38*  GLUCOSE 121* 108* 108*  BUN 37* 28* 27*  CREATININE 0.96 0.93 0.85  CALCIUM 8.5* 9.0 8.5*  GFRNONAA >60 >60 >60  ANIONGAP 7 13 6     No results for input(s): "PROT", "ALBUMIN", "AST", "ALT", "ALKPHOS", "BILITOT" in  the last 168 hours. Lipids No results for input(s): "CHOL", "TRIG", "HDL", "LABVLDL", "LDLCALC", "CHOLHDL" in the last 168 hours.  Hematology Recent Labs  Lab 02/26/23 0450 02/27/23 0547 02/28/23 0428  WBC 16.2* 15.9* 20.4*  RBC 5.55 5.12 5.07  HGB 15.8 14.3 14.6  HCT 50.6 46.3 45.2  MCV 91.2 90.4 89.2  MCH 28.5 27.9 28.8  MCHC 31.2 30.9 32.3  RDW 13.8 13.8 13.9  PLT 433* 348 352   Thyroid No results for input(s): "TSH", "FREET4" in the last 168 hours.  BNPNo results for  input(s): "BNP", "PROBNP" in the last 168 hours.  DDimer No results for input(s): "DDIMER" in the last 168 hours.   Radiology/Studies:  No results found.   Assessment and Plan:   Paroxysmal Afib now with RVR - patient has h/o permanent Afib not on a/c by choice - post-op afib RVR with new bundle vs VT. Will discuss with MD - afib rates rates 100-120 - PTA lopressor 100mg  BID>this was held prior to surgery - patient reports CP while laying flat and chronic SOB - PTA ASA and Plavix held - on IV heparin - restart BB - monitor tele - Keep K>4 and Mag>2. Check TSH - check echo  HFrEF - echo 09/2022 showed LVEF 35-40% - Echo 01/2022 showed LVEF 45-50%, mild MR - Echo 09/2020 showed LVEF 40-45% - most recent cath in 2016 showed 100% pRCA stenosis, otherwise nonobstructive disease - patient declined subsequent cath - PTA lopressor, torsemide 40mg  BID x 3 days, 40mg  daily - he is on toprol 50mg  daily, Farixga 10mg  daily and spiro 12.5mg  daily in the hospital and torsemide 10mg  daily - patient appears euvolemic - BNP 332 on 7/11, appears it is chronically elevated  CAD - cath in 2016 showed 100% RCA lesion  - no further ischemic procedures since this time - he reported chest pain while laying flat - may have new bundle on EKG, appears to be LBBB - check an echo - patient has declined cath in the past, will discuss further ischemic work-up with MD. - PTA ASA and Plavix   For questions or updates, please contact [facility] Please consult [email or web address] for contact info under    Signed, Prabhnoor Ellenberger David Stall, PA-C  02/28/2023 2:36 PM

## 2023-02-28 NOTE — Transfer of Care (Signed)
Immediate Anesthesia Transfer of Care Note  Patient: Erik [name]  Procedure(s) Performed: IR KYPHO THORACIC WITH BONE BIOPSY  Patient Location: PACU  Anesthesia Type:General  Level of Consciousness: patient cooperative and confused  Airway & Oxygen Therapy: Patient Spontanous Breathing and Patient connected to nasal cannula oxygen  Post-op Assessment: Report given to RN and Post -op Vital signs reviewed and stable  Post vital signs: Reviewed and stable  Last Vitals:  Vitals Value Taken Time  BP 121/107 02/28/23 1430  Temp 36 C 02/28/23 1400  Pulse 144 02/28/23 1431  Resp 32 02/28/23 1431  SpO2 94 % 02/28/23 1431  Vitals shown include unfiled device data.  Last Pain:  Vitals:   02/28/23 1121  TempSrc: Oral  PainSc: 0-No pain      Patients Stated Pain Goal: 2 (02/21/23 2149)  Complications: No notable events documented.

## 2023-02-28 NOTE — Progress Notes (Signed)
PT Cancellation Note  Patient Details Name: Erik [name] MRN: [id number] DOB: 1955-08-15   Cancelled Treatment:    Reason Eval/Treat Not Completed: Medical issues which prohibited therapy (Pt OTF for Kyphoplasty with IR. Will resume PT services once medically appropriate.)  10:46 AM, 02/28/23 Rosamaria Lints, PT, DPT Physical Therapist - [facility]  [phone] (ASCOM)    Liron Eissler C 02/28/2023, 10:45 AM

## 2023-02-28 NOTE — Progress Notes (Signed)
       CROSS COVER NOTE  NAME: Erik [name] MRN: [id number] DOB : April 05, 1956    Concern as stated by nurse / staff   Hi Dr Para March pt just had kyphoplasty today and went afib rvr complaining of 3/10 CP described as aching pain. HR now is at 82. He also have diminshed lung sounds. Potassium was at 5.4 but didn't see anything done for his potassium. Were doing EKG now. Would you like add somehting else. thanks  19 mins first trops was at 23 second was just got drawn waiting for result  MK EKG resulted      Pertinent findings on chart review: Last progress note reviewed:  medical history significant of paroxysmal atrial fibrillation not on anticoagulation, chronic systolic congestive heart failure with ejection fraction 35 to 40%, moderate pulm hypertension, COPD, chronic hypoxemia on 4 L oxygen, coronary disease, essential hypertension. Admitted on 7/11 with intractable pain secondary to compression fractures.  Had a CTA chest on admission that ruled out PE  EKG below showing A-fib flutter rate controlled at 90     Cardiac Panel (last 3 results) Recent Labs    02/28/23 2029 02/28/23 2235  TROPONINIHS 23* 24*     Assessment and  Interventions   Assessment:  Chest pain.  No history of CAD, possibly related to recent bout of coughing with hemoptysis on 7/20 Known paroxysmal atrial fibrillation, not on Eliquis S/p kyphoplasty on 7/23  Plan: EKG showing controlled rate no acute ischemic changes Troponins negative Continue metoprolol.  Not on anticoagulation Trial of antireflux meds Patient currently on Lovenox 0.5 mg/kg so low suspicion for PE but will get D-dimer and if elevated will increase Lovenox to 1 mg/kg

## 2023-02-28 NOTE — Plan of Care (Signed)
  Problem: Respiratory: Goal: Ability to maintain adequate ventilation will improve Outcome: Progressing   Problem: Clinical Measurements: Goal: Will remain free from infection Outcome: Progressing Goal: Respiratory complications will improve Outcome: Progressing   

## 2023-02-28 NOTE — Anesthesia Preprocedure Evaluation (Signed)
Anesthesia Evaluation  Patient identified by MRN, date of birth, ID band Patient awake    Reviewed: Allergy & Precautions, H&P , NPO status , Patient's Chart, lab work & pertinent test results, reviewed documented beta blocker date and time   History of Anesthesia Complications Negative for: history of anesthetic complications  Airway Mallampati: IV  TM Distance: >3 FB Neck ROM: full    Dental  (+) Edentulous Upper, Edentulous Lower, Dental Advidsory Given, Chipped   Pulmonary shortness of breath and Long-Term Oxygen Therapy, asthma , sleep apnea and Continuous Positive Airway Pressure Ventilation , COPD,  COPD inhaler and oxygen dependent, neg recent URI, former smoker   Pulmonary exam normal breath sounds clear to auscultation       Cardiovascular Exercise Tolerance: Good hypertension, (-) angina + CAD, + Past MI, + Cardiac Stents, + Peripheral Vascular Disease and +CHF  (-) CABG + dysrhythmias Atrial Fibrillation (-) Valvular Problems/Murmurs Rhythm:regular Rate:Normal     Neuro/Psych negative neurological ROS  negative psych ROS   GI/Hepatic Neg liver ROS,GERD  ,,  Endo/Other  negative endocrine ROS    Renal/GU negative Renal ROS  negative genitourinary   Musculoskeletal   Abdominal   Peds  Hematology negative hematology ROS (+)   Anesthesia Other Findings Past Medical History: No date: Arthritis No date: Asthma No date: Atrial fibrillation (HCC) No date: CHF (congestive heart failure) (HCC) No date: COPD (chronic obstructive pulmonary disease) (HCC) No date: Coronary artery disease No date: GERD (gastroesophageal reflux disease) No date: Hypertension No date: Myocardial infarction (HCC) No date: Shortness of breath dyspnea   Reproductive/Obstetrics negative OB ROS                             Anesthesia Physical Anesthesia Plan  ASA: 3  Anesthesia Plan: General   Post-op  Pain Management:    Induction: Intravenous  PONV Risk Score and Plan: 2 and Propofol infusion and TIVA  Airway Management Planned: Natural Airway and Simple Face Mask  Additional Equipment:   Intra-op Plan:   Post-operative Plan:   Informed Consent: I have reviewed the patients History and Physical, chart, labs and discussed the procedure including the risks, benefits and alternatives for the proposed anesthesia with the patient or authorized representative who has indicated his/her understanding and acceptance.     Dental Advisory Given  Plan Discussed with: Anesthesiologist, CRNA and Surgeon  Anesthesia Plan Comments:        Anesthesia Quick Evaluation

## 2023-02-28 NOTE — Consult Note (Signed)
ANTICOAGULATION CONSULT NOTE  Pharmacy Consult for Heparin infusion Indication:  PVD  Allergies  Allergen Reactions   Benadryl [Diphenhydramine Hcl] Shortness Of Breath   Morphine And Codeine Swelling    Difficulty breathing per patient    Oxycodone Swelling    Severe mouth swelling requiring medical intervention    Patient Measurements: Height: 5\' 10"  (177.8 cm) Weight: 100.7 kg (222 lb) IBW/kg (Calculated) : 73 Heparin Dosing Weight: 94.1 kg  Vital Signs: Temp: 97.5 F (36.4 C) (07/22 2314) BP: 129/80 (07/22 2314) Pulse Rate: 88 (07/22 2314)  Labs: Recent Labs    02/26/23 0450 02/26/23 1239 02/26/23 1457 02/26/23 1826 02/27/23 0547 02/27/23 0906 02/27/23 1432 02/28/23 0428  HGB 15.8  --   --   --  14.3  --   --  14.6  HCT 50.6  --   --   --  46.3  --   --  45.2  PLT 433*  --   --   --  348  --   --  352  APTT  --  >200* 164*  --   --   --   --   --   LABPROT  --  14.5  --   --   --   --   --   --   INR  --  1.1  --   --   --   --   --   --   HEPARINUNFRC  --   --   --    < >  --  0.64 0.66 0.53  CREATININE 0.93  --   --   --  0.85  --   --   --    < > = values in this interval not displayed.    Estimated Creatinine Clearance: 98.9 mL/min (by C-G formula based on SCr of 0.85 mg/dL).   Medical History: Past Medical History:  Diagnosis Date   Arthritis    Asthma    Atrial fibrillation (HCC)    CHF (congestive heart failure) (HCC)    COPD (chronic obstructive pulmonary disease) (HCC)    Coronary artery disease    GERD (gastroesophageal reflux disease)    Hypertension    Myocardial infarction (HCC)    Shortness of breath dyspnea    Pertinent Medications:  No history of chronic anticoagulant use PTA  Assessment: 67 y.o. male with medical history significant of paroxysmal atrial fibrillation not on anticoagulation, chronic systolic congestive heart failure with ejection fraction 35 to 40%, moderate pulm hypertension, COPD, chronic hypoxemia on 4 L  oxygen, coronary disease, essential hypertension, who present to the hospital with complaints of worsening wounds in the right leg. Baseline labs are ordered and pending.  Goal of Therapy:  Heparin level 0.3-0.7 units/ml Monitor platelets by anticoagulation protocol: Yes   7/21 1826 HL 0.98, supratherapeutic @ 1350 u/hr>1200 7/22 0111 HL 0.70,  therapeutic-upper end, 1200>1150 u/hr 7/22 0906 HL 0.64, therapeutic @ 1150 units/hour 7/23 0428 HL 0.53, therapeutic x 3   Plan: Continue heparin infusion at 1150 units/hour Recheck HL daily w/ AM labs while therapeutic Daily CBC while on heparin F/u plans to restart asa/plavix after IR procedure on 7/23  Otelia Sergeant, PharmD, MBA 02/28/2023 5:30 AM

## 2023-02-28 NOTE — Progress Notes (Signed)
Pt is complaining of 3/10 CP aching/nagging CP. Pt had Kyphoplasty done today and went afib rvr. Pt also has diminished lung sounds. MD Para March made aware. Will continue to monitor.  Update 2321: See new orders. Will continue to monitor.

## 2023-02-28 NOTE — Plan of Care (Signed)

## 2023-03-01 ENCOUNTER — Inpatient Hospital Stay (HOSPITAL_COMMUNITY)
Admit: 2023-03-01 | Discharge: 2023-03-01 | Disposition: A | Discharge status: Home / Self Care | Payer: HMO | Attending: Medical | Admitting: Medical

## 2023-03-01 ENCOUNTER — Other Ambulatory Visit: Payer: HMO

## 2023-03-01 DIAGNOSIS — I255 Ischemic cardiomyopathy: Secondary | ICD-10-CM

## 2023-03-01 DIAGNOSIS — I4891 Unspecified atrial fibrillation: Secondary | ICD-10-CM | POA: Diagnosis not present

## 2023-03-01 DIAGNOSIS — M549 Dorsalgia, unspecified: Secondary | ICD-10-CM | POA: Diagnosis not present

## 2023-03-01 DIAGNOSIS — I5022 Chronic systolic (congestive) heart failure: Secondary | ICD-10-CM | POA: Diagnosis not present

## 2023-03-01 DIAGNOSIS — I480 Paroxysmal atrial fibrillation: Secondary | ICD-10-CM | POA: Diagnosis not present

## 2023-03-01 DIAGNOSIS — I25118 Atherosclerotic heart disease of native coronary artery with other forms of angina pectoris: Secondary | ICD-10-CM

## 2023-03-01 LAB — CBC
HCT: 47.4 % (ref 39.0–52.0)
Hemoglobin: 14.5 g/dL (ref 13.0–17.0)
MCH: 28.4 pg (ref 26.0–34.0)
MCHC: 30.6 g/dL (ref 30.0–36.0)
MCV: 92.8 fL (ref 80.0–100.0)
Platelets: 340 10*3/uL (ref 150–400)
RBC: 5.11 MIL/uL (ref 4.22–5.81)
RDW: 14 % (ref 11.5–15.5)
WBC: 19.8 10*3/uL — ABNORMAL HIGH (ref 4.0–10.5)
nRBC: 0 % (ref 0.0–0.2)

## 2023-03-01 LAB — ECHOCARDIOGRAM COMPLETE: Weight: 3552.05 oz

## 2023-03-01 LAB — TSH: TSH: 1.07 u[IU]/mL (ref 0.350–4.500)

## 2023-03-01 LAB — D-DIMER, QUANTITATIVE: D-Dimer, Quant: 2.44 ug/mL-FEU — ABNORMAL HIGH (ref 0.00–0.50)

## 2023-03-01 MED ORDER — TORSEMIDE 20 MG PO TABS
40.0000 mg | ORAL_TABLET | Freq: Every day | ORAL | Status: DC
  Administered 2023-03-02 – 2023-03-04 (×3): 40 mg via ORAL
  Filled 2023-03-01 (×3): qty 2

## 2023-03-01 MED ORDER — METOPROLOL SUCCINATE ER 50 MG PO TB24
75.0000 mg | ORAL_TABLET | Freq: Every day | ORAL | Status: DC
  Administered 2023-03-02: 75 mg via ORAL
  Filled 2023-03-01: qty 1

## 2023-03-01 MED ORDER — TRAMADOL HCL 50 MG PO TABS
100.0000 mg | ORAL_TABLET | Freq: Four times a day (QID) | ORAL | Status: DC | PRN
  Administered 2023-03-01 – 2023-03-04 (×6): 100 mg via ORAL
  Filled 2023-03-01 (×7): qty 2

## 2023-03-01 MED ORDER — FUROSEMIDE 10 MG/ML IJ SOLN
40.0000 mg | Freq: Once | INTRAMUSCULAR | Status: AC
  Administered 2023-03-01: 40 mg via INTRAVENOUS
  Filled 2023-03-01: qty 4

## 2023-03-01 MED ORDER — ATORVASTATIN CALCIUM 80 MG PO TABS
80.0000 mg | ORAL_TABLET | Freq: Every day | ORAL | Status: DC
  Administered 2023-03-02 – 2023-03-04 (×3): 80 mg via ORAL
  Filled 2023-03-01 (×4): qty 1

## 2023-03-01 MED ORDER — METOPROLOL SUCCINATE ER 25 MG PO TB24
25.0000 mg | ORAL_TABLET | Freq: Once | ORAL | Status: AC
  Administered 2023-03-01: 25 mg via ORAL
  Filled 2023-03-01: qty 1

## 2023-03-01 NOTE — Progress Notes (Addendum)
Pt refused brace at this time due to SOB added " I will wait for the morning provider before it put it on." Pt was educated about its importance. Will continue to monitor.

## 2023-03-01 NOTE — Plan of Care (Signed)
  Problem: Education: Goal: Knowledge of General Education information will improve Description: Including pain rating scale, medication(s)/side effects and non-pharmacologic comfort measures Outcome: Progressing   Problem: Clinical Measurements: Goal: Respiratory complications will improve Outcome: Progressing   Problem: Clinical Measurements: Goal: Cardiovascular complication will be avoided Outcome: Progressing   Problem: Nutrition: Goal: Adequate nutrition will be maintained Outcome: Progressing   Problem: Pain Managment: Goal: General experience of comfort will improve Outcome: Progressing   Problem: Safety: Goal: Ability to remain free from injury will improve Outcome: Progressing   

## 2023-03-01 NOTE — Evaluation (Signed)
Physical Therapy Re-Evaluation Patient Details Name: Erik [name] MRN: [id number] DOB: 15-Oct-1955 Today's Date: 03/01/2023  History of Present Illness  Pt is a 67 y.o. male past medical history significant for peripheral arterial disease, CAD, CHF, atrial fibrillation, COPD. Admitted due to SOB, cellulitis of LE, and a venous ulcer.  Pt with T11 and L3 compression facture now s/p kyphoplasthy 7/23.   Clinical Impression  Pt somewhat anxious during the session but ultimately put forth good effort.  Pt required physical assistance with all below functional tasks along with cuing for proper sequencing for log rolling, transfers, and safe use of the RW.  Pt was only able to take several small, effortful steps near the EOB and to the chair with mostly shuffling steps and assist to move the RW.  Pt quite anxious after making it to the recliner with frequent cuing for breathing technique. Pt will benefit from continued PT services upon discharge to safely address deficits listed in patient problem list for decreased caregiver assistance and eventual return to PLOF.         Assistance Recommended at Discharge Frequent or constant Supervision/Assistance  If plan is discharge home, recommend the following:  Can travel by private vehicle  A lot of help with walking and/or transfers;A lot of help with bathing/dressing/bathroom;Help with stairs or ramp for entrance;Assist for transportation;Assistance with cooking/housework   No    Equipment Recommendations Other (comment) (TBD at next venue of care)  Recommendations for Other Services       Functional Status Assessment Patient has had a recent decline in their functional status and/or demonstrates limited ability to make significant improvements in function in a reasonable and predictable amount of time     Precautions / Restrictions Precautions Precautions: Fall Required Braces or Orthoses: Spinal Brace Spinal Brace: Thoracolumbosacral  orthotic;Applied in sitting position Restrictions Weight Bearing Restrictions: No      Mobility  Bed Mobility Overal bed mobility: Needs Assistance Bed Mobility: Rolling, Sidelying to Sit Rolling: Min assist Sidelying to sit: +2 for physical assistance, Min assist       General bed mobility comments: Mod verbal and tactile cues for log roll sequencing    Transfers Overall transfer level: Needs assistance Equipment used: Rolling walker (2 wheels) Transfers: Sit to/from Stand Sit to Stand: Min assist, From elevated surface           General transfer comment: Verbal cues for hand placement and general sequencing    Ambulation/Gait Ambulation/Gait assistance: Min assist Gait Distance (Feet): 3 Feet Assistive device: Rolling walker (2 wheels) Gait Pattern/deviations: Step-to pattern, Antalgic, Shuffle Gait velocity: decreased     General Gait Details: Pt able to take several effortful steps at the EOB and to chair with min A to guide the RW  Stairs            Wheelchair Mobility     Tilt Bed    Modified Rankin (Stroke Patients Only)       Balance Overall balance assessment: Needs assistance Sitting-balance support: Bilateral upper extremity supported, Feet supported Sitting balance-Leahy Scale: Fair     Standing balance support: Bilateral upper extremity supported, During functional activity, Reliant on assistive device for balance Standing balance-Leahy Scale: Fair                               Pertinent Vitals/Pain Pain Assessment Pain Assessment: 0-10 Pain Score: 6  Pain Location: back Pain Descriptors / Indicators: Sore,  Guarding, Moaning Pain Intervention(s): Limited activity within patient's tolerance, Premedicated before session, Monitored during session, RN gave pain meds during session, Repositioned    Home Living Family/patient expects to be discharged to:: Private residence Living Arrangements: Spouse/significant  other Available Help at Discharge: Family;Available 24 hours/day Type of Home: House Home Access: Stairs to enter   [employer] of Steps: 6   Home Layout: One level Home Equipment: Agricultural consultant (2 wheels);Cane - quad Additional Comments: Home 3LO2/min    Prior Function Prior Level of Function : Needs assist       Physical Assist : ADLs (physical)   ADLs (physical): Dressing;Bathing;IADLs Mobility Comments: Reliant on RW or Cane. States he has not left home since his prior admission due to anxiety. ADLs Comments: IND in toileting, takes sponge baths.  Patient's wife assists with lower body dressing. Family handles IADLs,     Hand Dominance   Dominant Hand: Right    Extremity/Trunk Assessment   Upper Extremity Assessment Upper Extremity Assessment: Defer to OT evaluation    Lower Extremity Assessment Lower Extremity Assessment: Generalized weakness       Communication   Communication: No difficulties  Cognition Arousal/Alertness: Awake/alert Behavior During Therapy: Anxious Overall Cognitive Status: Within Functional Limits for tasks assessed                                          General Comments      Exercises Other Exercises Other Exercises: Log roll training   Assessment/Plan    PT Assessment Patient needs continued PT services  PT Problem List Decreased strength;Decreased activity tolerance;Decreased balance;Decreased mobility;Decreased knowledge of use of DME;Pain       PT Treatment Interventions DME instruction;Gait training;Stair training;Functional mobility training;Therapeutic activities;Therapeutic exercise;Balance training;Patient/family education    PT Goals (Current goals can be found in the Care Plan section)  Acute Rehab PT Goals Patient Stated Goal: To get better PT Goal Formulation: With patient Time For Goal Achievement: 03/14/23 Potential to Achieve Goals: Fair    Frequency Min 1X/week      Co-evaluation PT/OT/SLP Co-Evaluation/Treatment: Yes Reason for Co-Treatment: Complexity of the patient's impairments (multi-system involvement);For patient/therapist safety PT goals addressed during session: Mobility/safety with mobility;Proper use of DME         AM-PAC PT "6 Clicks" Mobility  Outcome Measure Help needed turning from your back to your side while in a flat bed without using bedrails?: A Little Help needed moving from lying on your back to sitting on the side of a flat bed without using bedrails?: A Lot Help needed moving to and from a bed to a chair (including a wheelchair)?: A Lot Help needed standing up from a chair using your arms (e.g., wheelchair or bedside chair)?: A Little Help needed to walk in hospital room?: A Lot Help needed climbing 3-5 steps with a railing? : Total 6 Click Score: 13    End of Session Equipment Utilized During Treatment: Oxygen;Gait belt;Back brace Activity Tolerance: Patient limited by pain Patient left: in chair;with call bell/phone within reach;with chair alarm set;with SCD's reapplied Nurse Communication: Mobility status PT Visit Diagnosis: Muscle weakness (generalized) (M62.81);Pain;Difficulty in walking, not elsewhere classified (R26.2);Unsteadiness on feet (R26.81) Pain - Right/Left:  (central back) Pain - part of body:  (back)    Time: 7829-5621 PT Time Calculation (min) (ACUTE ONLY): 29 min   Charges:   PT Evaluation $PT Re-evaluation: 1 Re-eval  PT General Charges $$ ACUTE PT VISIT: 1 Visit        D. Scott Ameer Sanden PT, DPT 03/01/23, 11:07 AM

## 2023-03-01 NOTE — Anesthesia Postprocedure Evaluation (Signed)
Anesthesia Post Note  Patient: Erik [name]  Procedure(s) Performed: IR KYPHO THORACIC WITH BONE BIOPSY  Patient location during evaluation: PACU Anesthesia Type: General Level of consciousness: awake and alert Pain management: pain level controlled Vital Signs Assessment: post-procedure vital signs reviewed and stable Respiratory status: spontaneous breathing, nonlabored ventilation, respiratory function stable and patient connected to nasal cannula oxygen Cardiovascular status: blood pressure returned to baseline and stable Postop Assessment: no apparent nausea or vomiting Anesthetic complications: no Comments: Patient was in afib with RVR at the end of his case and this continued into the PACU after the case was over.  He was given a dose of labetalol in the OR to help slow him down.  Additionally, his breathing was not very good at the end of the case so he was placed on a SuperNOVA.  On arrival to the PACU his HR was improved, but still elevated into the 120s as well as was showing some ST depression at -1.8.  Cardiology consult was obtained at that time.  Otherwise, his major complaint was back pain.   No notable events documented.   Last Vitals:  Vitals:   03/01/23 1918 03/01/23 2112  BP: 124/81   Pulse: 82   Resp: 20   Temp: 36.6 C   SpO2: 97% 99%    Last Pain:  Vitals:   03/01/23 2000  TempSrc:   PainSc: 0-No pain                 Lenard Simmer

## 2023-03-01 NOTE — Evaluation (Signed)
Occupational Therapy Evaluation Patient Details Name: Erik [name] MRN: [id number] DOB: [birth year] Today's Date: 03/01/2023   History of Present Illness Pt is a 67 y.o. male past medical history significant for peripheral arterial disease, CAD, CHF, atrial fibrillation, COPD. Admitted due to SOB, cellulitis of LE, and a venous ulcer.  Pt with T11 and L3 compression facture now s/p kyphoplasthy 7/23.   Clinical Impression   Patient presenting with decreased Ind in self care,balance, functional mobility/transfers, endurance, and safety awareness.  Patient reports living at home with wife who assists him with LB self care tasks at home. Pt's largest barrier to going home is steps to enter. Pt very limited in regards to functional mobility secondary to strength and increased anxiety. Max multimodal cuing for breathing techniques. Pt on 4Ls via [place] and RR increased to 30's with stand and steps to pivot to recliner chair. Total A to don TLSO brace in seated position.  Patient will benefit from acute OT to increase overall independence in the areas of ADLs, functional mobility, and safety awareness in order to safely discharge.     Recommendations for follow up therapy are one component of a multi-disciplinary discharge planning process, led by the attending physician.  Recommendations may be updated based on patient status, additional functional criteria and insurance authorization.   Assistance Recommended at Discharge Intermittent Supervision/Assistance  Patient can return home with the following A lot of help with walking and/or transfers;A lot of help with bathing/dressing/bathroom;Assistance with cooking/housework;Assist for transportation;Help with stairs or ramp for entrance;Direct supervision/assist for medications management    Functional Status Assessment  Patient has had a recent decline in their functional status and demonstrates the ability to make significant improvements in function in  a reasonable and predictable amount of time.  Equipment Recommendations  Other (comment) (defer to next venue of care)       Precautions / Restrictions Precautions Precautions: Fall Required Braces or Orthoses: Spinal Brace Spinal Brace: Thoracolumbosacral orthotic;Applied in sitting position Restrictions Weight Bearing Restrictions: No      Mobility Bed Mobility Overal bed mobility: Needs Assistance Bed Mobility: Rolling, Sidelying to Sit Rolling: Min assist Sidelying to sit: +2 for physical assistance, Min assist            Transfers Overall transfer level: Needs assistance Equipment used: Rolling walker (2 wheels) Transfers: Sit to/from Stand Sit to Stand: Min assist, From elevated surface     Step pivot transfers: Min assist            Balance Overall balance assessment: Needs assistance Sitting-balance support: Bilateral upper extremity supported, Feet supported Sitting balance-Leahy Scale: Fair     Standing balance support: Bilateral upper extremity supported, During functional activity, Reliant on assistive device for balance Standing balance-Leahy Scale: Fair                             ADL either performed or assessed with clinical judgement   ADL Overall ADL's : Needs assistance/impaired                                       General ADL Comments: total A to don lumbar brace while seated     Vision Patient Visual Report: No change from baseline              Pertinent Vitals/Pain Pain Assessment Pain Assessment: 0-10 Pain Score:  6  Pain Location: back Pain Descriptors / Indicators: Sore, Guarding, Moaning Pain Intervention(s): Monitored during session, Repositioned, RN gave pain meds during session     Hand Dominance Right   Extremity/Trunk Assessment Upper Extremity Assessment Upper Extremity Assessment: Generalized weakness   Lower Extremity Assessment Lower Extremity Assessment: Generalized  weakness   Cervical / Trunk Assessment Cervical / Trunk Assessment: Kyphotic   Communication Communication Communication: No difficulties   Cognition Arousal/Alertness: Awake/alert Behavior During Therapy: Anxious Overall Cognitive Status: Within Functional Limits for tasks assessed                                                  Home Living Family/patient expects to be discharged to:: Private residence Living Arrangements: Spouse/significant other Available Help at Discharge: Family;Available 24 hours/day Type of Home: House Home Access: Stairs to enter [employer] of Steps: 6 Entrance Stairs-Rails: Right;Left Home Layout: One level     Bathroom Shower/Tub: Sponge bathes at baseline   Bathroom Toilet: Handicapped height     Home Equipment: Agricultural consultant (2 wheels);Cane - quad   Additional Comments: Home 3LO2/min      Prior Functioning/Environment Prior Level of Function : Needs assist       Physical Assist : ADLs (physical)   ADLs (physical): Dressing;Bathing;IADLs Mobility Comments: Reliant on RW or Cane. States he has not left home since his prior admission due to anxiety. ADLs Comments: IND in toileting, takes sponge baths.  Patient's wife assists with lower body dressing. Family handles IADLs,        OT Problem List: Decreased strength;Cardiopulmonary status limiting activity;Decreased activity tolerance;Impaired balance (sitting and/or standing);Decreased knowledge of use of DME or AE      OT Treatment/Interventions: Self-care/ADL training;Therapeutic exercise;Therapeutic activities;Energy conservation;DME and/or AE instruction;Patient/family education;Balance training    OT Goals(Current goals can be found in the care plan section) Acute Rehab OT Goals Patient Stated Goal: to go home OT Goal Formulation: With patient Time For Goal Achievement: 03/15/23 Potential to Achieve Goals: Good ADL Goals Pt Will Perform  Grooming: with modified independence;sitting Pt Will Perform Lower Body Dressing: with min guard assist;with adaptive equipment Pt Will Transfer to Toilet: with supervision;ambulating Pt Will Perform Toileting - Clothing Manipulation and hygiene: with supervision;sit to/from stand  OT Frequency: Min 1X/week    Co-evaluation   Reason for Co-Treatment: Complexity of the patient's impairments (multi-system involvement);For patient/therapist safety PT goals addressed during session: Mobility/safety with mobility;Proper use of DME        AM-PAC OT "6 Clicks" Daily Activity     Outcome Measure Help from another person eating meals?: None Help from another person taking care of personal grooming?: A Little Help from another person toileting, which includes using toliet, bedpan, or urinal?: A Lot Help from another person bathing (including washing, rinsing, drying)?: A Lot Help from another person to put on and taking off regular upper body clothing?: A Little Help from another person to put on and taking off regular lower body clothing?: A Lot 6 Click Score: 16   End of Session Equipment Utilized During Treatment: Rolling walker (2 wheels);Oxygen Nurse Communication: Mobility status  Activity Tolerance: Patient tolerated treatment well Patient left: in chair;with chair alarm set;with call bell/phone within reach  OT Visit Diagnosis: Other abnormalities of gait and mobility (R26.89);Muscle weakness (generalized) (M62.81)  Time: 0865-7846 OT Time Calculation (min): 29 min Charges:  OT General Charges $OT Visit: 1 Visit OT Evaluation $OT Eval Moderate Complexity: [address], [place], OTR/L , [employer] ascom [phone]  03/01/23, 1:10 PM

## 2023-03-01 NOTE — Progress Notes (Signed)
[facility]  - [place] at [facility]   PATIENT NAME: Erik [name]    MR#:  [id number]  DATE OF BIRTH:  1955/08/25  SUBJECTIVE:   No family at bedside. Spoke with wife Erik [name] on the phone. Patient is status post-plasty. Complains of back pain. Educated about back brace and importance of wearing it while out of bed. Patient is refusing to go to rehab. Discussed his multiple comorbidities and need for rehab he continues to decline. Wife is aware.   VITALS:  Blood pressure (!) 153/85, pulse 92, temperature 97.9 F (36.6 C), resp. rate 20, height 5\' 10"  (1.778 m), weight 100.7 kg, SpO2 98%.  PHYSICAL EXAMINATION:   GENERAL:  67 y.o.-year-old patient with no acute distress. Morbidly ob LUNGS: distant breath sounds bilaterally, no wheezing CARDIOVASCULAR: S1, S2 normal. No murmur   ABDOMEN: Soft, nontender, nondistended. Bowel sounds present.  EXTREMITIES: 1+ edema b/l.    NEUROLOGIC: nonfocal  patient is alert and awake SKIN: No obvious rash, lesion, or ulcer.   LABORATORY PANEL:  CBC Recent Labs  Lab 03/01/23 0504  WBC 19.8*  HGB 14.5  HCT 47.4  PLT 340    Chemistries  Recent Labs  Lab 02/28/23 2029  NA 133*  K 5.4*  CL 94*  CO2 31  GLUCOSE 160*  BUN 23  CREATININE 0.89  CALCIUM 8.5*  MG 2.5*   Cardiac Enzymes No results for input(s): "TROPONINI" in the last 168 hours. RADIOLOGY:  IR KYPHO THORACIC WITH BONE BIOPSY  Result Date: 03/01/2023 CLINICAL DATA:  Symptomatic compression fractures. Please perform image guided kyphoplasty for therapeutic purposes. EXAM: FLUOROSCOPIC GUIDED KYPHOPLASTY OF THE T11, L3 AND L5 VERTEBRAL BODIES COMPARISON:  Thoracic and lumbar spine MRI-02/22/2023 MEDICATIONS: As antibiotic prophylaxis, Ancef 3 gm IV was ordered pre-procedure and administered intravenously within 1 hour of incision. ANESTHESIA/SEDATION: As per the ICU staff FLUOROSCOPY: 14 minutes, 48 seconds (1,285 mGy) COMPLICATIONS: SIR LEVEL B - Normal  therapy, includes overnight admission for observation. Procedure complicated by patient's development of atrial fibrillation with rapid ventricular response, ultimately managed medically. PROCEDURE: The procedure, risks (including but not limited to bleeding, infection, organ damage), benefits, and alternatives were explained to the patient. Questions regarding the procedure were encouraged and answered. The patient understands and consents to the procedure. The patient has suffered a fracture of the T11, L3 and L5 vertebral bodies. It is recommended that patients aged 32 years or older be evaluated for possible testing or treatment of osteoporosis. A copy of this procedure report is sent to the patient's referring physician The patient was placed prone on the fluoroscopic table. The skin overlying the upper thoracic region was then prepped and draped in the usual sterile fashion. Maximal barrier sterile technique was utilized including caps, mask, sterile gowns, sterile gloves, sterile drape, hand hygiene and skin antiseptic. Intravenous Fentanyl and Versed were administered as conscious sedation during continuous cardiorespiratory monitoring by the radiology RN. The left pedicle at T11 was then infiltrated with 1% lidocaine followed by the advancement of a Kyphon trocar needle through the left pedicle into the posterior one-third of the vertebral body. The trocar was removed and a bone biopsy was obtained at this location. Subsequently, the osteo drill was advanced to the anterior third of the vertebral body. The osteo drill was retracted. Through the working cannula, a Kyphon inflatable bone tamp 15 x 3 was advanced and positioned with the distal marker approximately 5 mm from the anterior aspect of the cortex. Appropriate positioning was confirmed on  the AP projection. The identical procedure was repeated at the L3 vertebral body via left transpedicular approach and at the L5 vertebral body via a right  transpedicular approach. At this time, the balloons were expanded using contrast via a Kyphon inflation syringe device via micro tubing. Inflations were continued until there was near apposition with the superior end plate. Methylmethacrylate mixture was reconstituted in the Kyphon bone mixing device system. This was then loaded into the delivery mechanism, attached to Kyphon bone fillers. The balloons were deflated and removed followed by the instillation of methylmethacrylate mixture with excellent filling in the AP and lateral projections. No extravasation was noted in the disk spaces or posteriorly into the spinal canal. No epidural venous contamination was seen. The working cannulae and the bone filler were then retrieved and removed. Hemostasis was achieved with manual compression. The patient was imaged with the hypercarbic and tachycardic necessitating the prompt conclusion of the procedure. The patient otherwise tolerated the procedure well without immediate postprocedural complication. IMPRESSION: 1. Technically successful T11, L3 and L5 vertebral body augmentation using balloon kyphoplasty. 2. Per CMS PQRS reporting requirements (PQRS Measure 24): Given the patient's age of greater than 50 and the fracture site (hip, distal radius, or spine), the patient should be tested for osteoporosis using DXA, and the appropriate treatment considered based on the DXA results. Electronically Signed   By: Simonne Come M.D.   On: 03/01/2023 09:38    Assessment and Plan  ohnnie L Svec is a 67 y.o. male with medical history significant of paroxysmal atrial fibrillation not on anticoagulation, chronic systolic congestive heart failure with ejection fraction 35 to 40%, moderate pulm hypertension, COPD, chronic hypoxemia on 4 L oxygen, coronary disease, essential hypertension, who present to the hospital with complaints of worsening wounds in the right leg.    Severe back pain T11, L3 and L5 compression fractures.   Pain control.   --Physical therapy recommending rehab-- patient has been refusing rehab. Will arrange home health PT. --s/p  3 level kyphoplasty on 7/23 -- restart aspirin and Plavix ttoday   Chronic respiratory failure with hypoxia (HCC) --Baseline 2 L nasal cannula.     COPD exacerbation (HCC) --Chronic respiratory failure on oxygen 2 L.  Completed prednisone.   --Continue inhalers and nebulizers.    Acute pain of left knee --Could be gout even though uric acid not high. Received Solu-Medrol x2  Hemoptysis Only slight.  Likely from coughing.  Will give cough medication Robitussin and Tessalon Perles.  Patient will end up needing a repeat CT scan in 2 to 3 months.  Aspirin and Plavix on hold until tomorrow.  No hemoptysis while on heparin drip. -- Resolved   Sepsis (HCC) Present on admission with right lower extremity cellulitis.  Patient had tachycardia tachypnea and leukocytosis and mildly elevated lactic acid.  Completed doxycycline.   Venous ulcer (HCC) --Will need follow-up at the wound care center.   PAD (peripheral artery disease) (HCC) ---aspirin and Plavix on 7/24. -- Patient will need to follow-up vascular surgery as outpatient.   Paroxysmal atrial fibrillation with RVR (HCC) -- continue metoprolol  Chronic systolic CHF (congestive heart failure) (HCC) --Last EF 35% patient on Toprol, Demadex, Farxiga and spironolactone.   Obesity (BMI 30-39.9) --BMI 31.85   Multiple lung nodules --Recommend repeating a CT scan in a few months to ensure clearing. Defer to PCP  Patient is at high risk for readmission given his multiple comorbidities. He is encouraged to consider rehab however he is so far  adamant and refusing care. His wife Erik [name] is aware. TOC for discharge planning to home with home health.    Procedures:successful cement augmentation of the T11, L3 and L5 vertebral bodies on 7/23 by IR   Family communication : wife Erik [name] on the phone Consults : IR, [facility] MG  cardiology CODE STATUS: full DVT Prophylaxis : SCD, ambulation Level of care: Telemetry Cardiac Status is: Inpatient Remains inpatient appropriate because: back pain, PT eval    TOTAL TIME TAKING CARE OF THIS PATIENT: 35 minutes.  >50% time spent on counselling and coordination of care  Note: This dictation was prepared with Dragon dictation along with smaller phrase technology. Any transcriptional errors that result from this process are unintentional.  Enedina Finner M.D    [facility]   CC: Primary care physician; Pcp, No

## 2023-03-01 NOTE — Progress Notes (Signed)
Progress Note  Patient Name: Erik [name] Date of Encounter: 03/01/2023  Primary Cardiologist: Kirke Corin  Subjective   No chest pain. Reports some SOB. On torsemide 10 mg daily while admitted, takes 40 mg daily at home.   Inpatient Medications    Scheduled Meds:  aspirin EC  81 mg Oral Daily   benzonatate  100 mg Oral TID   clopidogrel  75 mg Oral Daily   dapagliflozin propanediol  10 mg Oral Daily   enoxaparin (LOVENOX) injection  0.5 mg/kg Subcutaneous Q24H   fluticasone furoate-vilanterol  1 puff Inhalation Daily   And   umeclidinium bromide  1 puff Inhalation Daily   gabapentin  100 mg Oral TID   ipratropium-albuterol  3 mL Nebulization TID   lidocaine  1 patch Transdermal Q24H   metoprolol succinate  50 mg Oral Daily   pantoprazole  40 mg Oral Daily   polyethylene glycol  17 g Oral BID   senna-docusate  2 tablet Oral BID   torsemide  10 mg Oral Daily   Continuous Infusions:  PRN Meds: acetaminophen, albuterol, alum & mag hydroxide-simeth, alum & mag hydroxide-simeth, fentaNYL (SUBLIMAZE) injection, guaiFENesin-dextromethorphan, hydrOXYzine, metoprolol tartrate, nitroGLYCERIN, ondansetron (ZOFRAN) IV, traMADol   Vital Signs    Vitals:   03/01/23 0737 03/01/23 0800 03/01/23 0816 03/01/23 0855  BP: 111/74 106/74    Pulse: 74 (!) 57  91  Resp: 20 17    Temp: (!) 97.5 F (36.4 C)     TempSrc: Oral     SpO2:  99% 98%   Weight:      Height:        Intake/Output Summary (Last 24 hours) at 03/01/2023 0943 Last data filed at 03/01/2023 0738 Gross per 24 hour  Intake 1153 ml  Output 1505 ml  Net -352 ml   Filed Weights   02/16/23 0942 02/28/23 1125  Weight: 100.7 kg 100.7 kg    Telemetry    Afib, 70s to 90s bpm, rare episode into the 140s bpm - Personally Reviewed  ECG    No new tracings - Personally Reviewed  Physical Exam   GEN: No acute distress. Chronically ill appearing. Brace noted.  Neck: JVD difficult to assess with back brace in  place. Cardiac: IRIR, I/VI systolic murmur, no rubs, or gallops.  Respiratory: Clear to auscultation bilaterally.  GI: Soft, nontender, non-distended.   MS: Trivial pretibial edema; No deformity. Neuro:  Alert and oriented x 3; Nonfocal.  Psych: Normal affect.  Labs    Chemistry Recent Labs  Lab 02/26/23 0450 02/27/23 0547 02/28/23 2029  NA 133* 132* 133*  K 4.3 4.2 5.4*  CL 85* 88* 94*  CO2 35* 38* 31  GLUCOSE 108* 108* 160*  BUN 28* 27* 23  CREATININE 0.93 0.85 0.89  CALCIUM 9.0 8.5* 8.5*  GFRNONAA >60 >60 >60  ANIONGAP 13 6 8      Hematology Recent Labs  Lab 02/27/23 0547 02/28/23 0428 03/01/23 0504  WBC 15.9* 20.4* 19.8*  RBC 5.12 5.07 5.11  HGB 14.3 14.6 14.5  HCT 46.3 45.2 47.4  MCV 90.4 89.2 92.8  MCH 27.9 28.8 28.4  MCHC 30.9 32.3 30.6  RDW 13.8 13.9 14.0  PLT 348 352 340    Cardiac EnzymesNo results for input(s): "TROPONINI" in the last 168 hours. No results for input(s): "TROPIPOC" in the last 168 hours.   BNPNo results for input(s): "BNP", "PROBNP" in the last 168 hours.   DDimer  Recent Labs  Lab 02/28/23 [phone]  DDIMER 2.44*     Radiology    IR KYPHO THORACIC WITH BONE BIOPSY  Result Date: 03/01/2023 IMPRESSION: 1. Technically successful T11, L3 and L5 vertebral body augmentation using balloon kyphoplasty. 2. Per CMS PQRS reporting requirements (PQRS Measure 24): Given the patient's age of greater than 50 and the fracture site (hip, distal radius, or spine), the patient should be tested for osteoporosis using DXA, and the appropriate treatment considered based on the DXA results. Electronically Signed   By: Simonne Come M.D.   On: 03/01/2023 09:38    Cardiac Studies   2D echo pending __________  2D echo 08/30/2022: 1. Left ventricular ejection fraction, by estimation, is 35 to 40%. The  left ventricle has moderately decreased function. Left ventricular  endocardial border not optimally defined to evaluate regional wall motion.  Left  ventricular diastolic parameters  are indeterminate.   2. Right ventricular systolic function is normal. The right ventricular  size is mildly enlarged. There is moderately elevated pulmonary artery  systolic pressure.   3. Left atrial size was mildly dilated.   4. The mitral valve is abnormal. Mild to moderate mitral valve  regurgitation. No evidence of mitral stenosis. Moderate mitral annular  calcification.   5. The aortic valve is normal in structure. Aortic valve regurgitation is  trivial. Aortic valve sclerosis/calcification is present, without any  evidence of aortic stenosis.   6. The inferior vena cava is dilated in size with <50% respiratory  variability, suggesting right atrial pressure of 15 mmHg.   Patient Profile     67 y.o. male with history of CAD status post BMS in 2009 to OM 2 and CTO of the proximal RCA by LHC in 2016, permanent A-fib dating back to 2012, HFrEF, chronic hypoxic respiratory failure on supplemental oxygen at baseline, HLD, COPD, and arthritis who was admitted on 02/16/2023 with worsening wounds on the right leg as well as back pain. He was noted to have multilevel compression fractures and underwent kyphoplasty 02/28/2023 due to refractory pain, and we are seeing for A-fib with RVR during kyphoplasty.  Assessment & Plan    1. Permanent Afib with RVR: -Possibly secondary to stress in the perioperative setting -No further WCT with improvement in ventricular rates, previously felt to be consistent with a Edyth Gunnels conduction and less likely VT -2022, he was placed on apixaban, though for unclear reasons this was discontinued and aspirin/clopidogrel were used in the setting of PAD -The patient has a history of falls and has reportedly declined anticoagulation in the past, this to be revisited with him prior to discharge -Recommend outpatient EP evaluation for consideration of watchman implantation, though his other comorbidities including poor healing lower extremity  wounds would place him at increased risk for postprocedural complications -Titrate Toprol-XL to 75 mg daily -Check TSH -Potassium and magnesium above goal  2.  HFrEF secondary to ICM: -Reports some dyspnea, defer elevated D-dimer to primary service -Give one-time dose IV Lasix 40 mg with continuation of oral torsemide tomorrow, this will also help with his mildly elevated potassium -Spironolactone held with mild hyperkalemia -PTA Farxiga -Consider addition of ARB if blood pressure, renal function, and potassium allow -Echo pending  3.  CAD involving native coronary arteries with mildly elevated high-sensitivity troponin/HLD: -Without chest pain -High-sensitivity troponin mildly elevated and flat trending, not consistent with ACS -Echo pending -No current plans for inpatient ischemic evaluation in the postoperative timeframe with recommendation to follow-up as outpatient for further discussion of cardiac risk stratification -Has been maintained  on ASA and clopidogrel given underlying PAD -Not on statin therapy prior to admission with LDL 89 in 08/2022 with goal LDL at least less than 70 -Start atorvastatin 80 mg     For questions or updates, please contact [facility] Please consult [email or web address] for contact info under Cardiology/STEMI.    Signed, Eula Listen, PA-C [facility] Pager: [phone] 03/01/2023, 9:43 AM

## 2023-03-02 ENCOUNTER — Ambulatory Visit (INDEPENDENT_AMBULATORY_CARE_PROVIDER_SITE_OTHER): Payer: HMO | Admitting: Vascular Surgery

## 2023-03-02 ENCOUNTER — Encounter (INDEPENDENT_AMBULATORY_CARE_PROVIDER_SITE_OTHER): Payer: HMO

## 2023-03-02 DIAGNOSIS — I25118 Atherosclerotic heart disease of native coronary artery with other forms of angina pectoris: Secondary | ICD-10-CM | POA: Diagnosis not present

## 2023-03-02 DIAGNOSIS — I480 Paroxysmal atrial fibrillation: Secondary | ICD-10-CM | POA: Diagnosis not present

## 2023-03-02 DIAGNOSIS — I255 Ischemic cardiomyopathy: Secondary | ICD-10-CM | POA: Diagnosis not present

## 2023-03-02 DIAGNOSIS — M549 Dorsalgia, unspecified: Secondary | ICD-10-CM | POA: Diagnosis not present

## 2023-03-02 LAB — BASIC METABOLIC PANEL
Anion gap: 4 — ABNORMAL LOW (ref 5–15)
BUN: 25 mg/dL — ABNORMAL HIGH (ref 8–23)
CO2: 31 mmol/L (ref 22–32)
Calcium: 8.3 mg/dL — ABNORMAL LOW (ref 8.9–10.3)
Chloride: 95 mmol/L — ABNORMAL LOW (ref 98–111)
Creatinine, Ser: 0.85 mg/dL (ref 0.61–1.24)
GFR, Estimated: >60 mL/min (ref >60)
Glucose, Bld: 88 mg/dL (ref 70–99)
Potassium: 4.7 mmol/L (ref 3.5–5.1)
Sodium: 130 mmol/L — ABNORMAL LOW (ref 135–145)

## 2023-03-02 LAB — ECHOCARDIOGRAM COMPLETE
AR max vel: 1.55 cm2
AV Area mean vel: 1.42 cm2
AV Peak grad: 7 mmHg
Ao pk vel: 1.32 m/s
Height: 70 in

## 2023-03-02 MED ORDER — METOPROLOL SUCCINATE ER 100 MG PO TB24
100.0000 mg | ORAL_TABLET | Freq: Every day | ORAL | Status: DC
  Administered 2023-03-03 – 2023-03-04 (×2): 100 mg via ORAL
  Filled 2023-03-02 (×2): qty 1

## 2023-03-02 MED ORDER — LOSARTAN POTASSIUM 25 MG PO TABS
12.5000 mg | ORAL_TABLET | Freq: Every day | ORAL | Status: DC
  Administered 2023-03-02 – 2023-03-04 (×3): 12.5 mg via ORAL
  Filled 2023-03-02 (×3): qty 1

## 2023-03-02 MED ORDER — METOPROLOL SUCCINATE ER 25 MG PO TB24
25.0000 mg | ORAL_TABLET | Freq: Once | ORAL | Status: AC
  Administered 2023-03-02: 25 mg via ORAL
  Filled 2023-03-02: qty 1

## 2023-03-02 MED ORDER — TAMSULOSIN HCL 0.4 MG PO CAPS
0.4000 mg | ORAL_CAPSULE | Freq: Every day | ORAL | Status: DC
  Administered 2023-03-02 – 2023-03-03 (×2): 0.4 mg via ORAL
  Filled 2023-03-02 (×2): qty 1

## 2023-03-02 NOTE — [place] FL2 (Signed)
[place] MEDICAID FL2 LEVEL OF CARE FORM     IDENTIFICATION  Patient Name: Erik [name] Birthdate: 26-Oct-1955 Sex: male Admission Date (Current Location): 02/16/2023  [facility] [place] Number:  Chiropodist and Address:  [facility], [address] [place], [place] [address]      Provider Number: [id number]  Attending Physician Name and Address:  Enedina Finner, MD  Relative Name and Phone Number:  Ayuub, Penley (Spouse)  [phone] [facility] Phone)    Current Level of Care: Hospital Recommended Level of Care: Skilled Nursing Facility Prior Approval Number:    Date Approved/Denied:   PASRR Number: [id number]  Discharge Plan: SNF    Current Diagnoses: Patient Active Problem List   Diagnosis Date Noted   Acute pain of left knee 02/27/2023   Hemoptysis 02/25/2023   Compression fracture of fifth lumbar vertebra (HCC) 02/25/2023   Closed compression fracture of third lumbar vertebra (HCC) 02/20/2023   Dysphagia 02/19/2023   Compression fracture of T11 vertebra (HCC) 02/17/2023   Multiple lung nodules 02/17/2023   Cellulitis and abscess of right leg 02/16/2023   Sepsis (HCC) 02/16/2023   Moderate pulmonary hypertension (HCC) 02/16/2023   Obesity (BMI 30-39.9) 09/07/2022   Acute on chronic respiratory failure with hypoxia (HCC) 09/07/2022   Epistaxis 09/07/2022   PAD (peripheral artery disease) (HCC) 09/02/2022   Paroxysmal atrial fibrillation with RVR (HCC) 08/27/2022   CAP (community acquired pneumonia) 08/27/2022   Right sided weakness 08/27/2022   Lower extremity cellulitis 07/14/2022   Leg swelling 07/13/2022   Left hip pain 01/17/2022   Venous ulcer (HCC) 01/14/2022   Bilateral cellulitis of lower leg 01/13/2022   Ambulatory dysfunction 01/13/2022   Bilateral lower leg cellulitis 01/13/2022   Chronic systolic CHF (congestive heart failure) (HCC) 01/13/2022   Chronic respiratory failure with hypoxia (HCC) 01/13/2022    Atherosclerosis of native arteries of extremity with intermittent claudication (HCC) 06/01/2020   Severe back pain 09/11/2017   Occlusion and stenosis of bilateral carotid arteries 09/11/2017   Pulmonic heart disease (HCC) 09/11/2017   COPD exacerbation (HCC) 08/14/2017   Chronic a-fib (HCC) 03/27/2015   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   Essential hypertension with goal blood pressure less than 130/80 02/23/2015    Orientation RESPIRATION BLADDER Height & Weight     Self, Time, Situation, Place  O2 (O23L) Continent Weight: 100.7 kg Height:  5\' 10"  (177.8 cm)  BEHAVIORAL SYMPTOMS/MOOD NEUROLOGICAL BOWEL NUTRITION STATUS  Other (Comment) (n/a)  (n/a) Continent Diet  AMBULATORY STATUS COMMUNICATION OF NEEDS Skin   Limited Assist Verbally Bruising (Eccyhmosis to bilaterl scrotum and legs)                       Personal Care Assistance Level of Assistance  Bathing, Dressing Bathing Assistance: Limited assistance   Dressing Assistance: Limited assistance     Functional Limitations Info  Sight Sight Info: Impaired        SPECIAL CARE FACTORS FREQUENCY  PT (By licensed PT), OT (By licensed OT)     PT Frequency: Min 2x weekly OT Frequency: Min 2x weekly            Contractures Contractures Info: Not present    Additional Factors Info  Code Status, Allergies Code Status Info: FULL Allergies Info: Benadryl (Diphenhydramine Hcl), Morphine And Codeine, Oxycodone           Current Medications (03/02/2023):  This is the current hospital active medication list Current Facility-Administered Medications  Medication Dose  Route Frequency Provider Last Rate Last Admin   acetaminophen (TYLENOL) tablet 650 mg  650 mg Oral Q6H PRN Marrion Coy, MD   650 mg at 03/01/23 1648   albuterol (PROVENTIL) (2.5 MG/3ML) 0.083% nebulizer solution 3 mL  3 mL Inhalation Q6H PRN Marrion Coy, MD   3 mL at 02/25/23 0610   alum & mag hydroxide-simeth (MAALOX/MYLANTA) 200-200-20 MG/5ML  suspension 30 mL  30 mL Oral Q4H PRN Alford Highland, MD   30 mL at 02/26/23 0005   alum & mag hydroxide-simeth (MAALOX/MYLANTA) 200-200-20 MG/5ML suspension 30 mL  30 mL Oral Q4H PRN Andris Baumann, MD       aspirin EC tablet 81 mg  81 mg Oral Daily Alford Highland, MD   81 mg at 03/02/23 0836   atorvastatin (LIPITOR) tablet 80 mg  80 mg Oral Daily Eula Listen M, PA-C   80 mg at 03/02/23 4782   benzonatate (TESSALON) capsule 100 mg  100 mg Oral TID Alford Highland, MD   100 mg at 03/02/23 0836   clopidogrel (PLAVIX) tablet 75 mg  75 mg Oral Daily Alford Highland, MD   75 mg at 03/02/23 0836   dapagliflozin propanediol (FARXIGA) tablet 10 mg  10 mg Oral Daily Alford Highland, MD   10 mg at 03/02/23 0837   enoxaparin (LOVENOX) injection 50 mg  0.5 mg/kg Subcutaneous Q24H [name], RPH   50 mg at 03/02/23 0835   fentaNYL (SUBLIMAZE) injection 12.5 mcg  12.5 mcg Intravenous Q4H PRN Alford Highland, MD   12.5 mcg at 02/23/23 1345   fluticasone furoate-vilanterol (BREO ELLIPTA) 100-25 MCG/ACT 1 puff  1 puff Inhalation Daily Marrion Coy, MD   1 puff at 03/02/23 0941   And   umeclidinium bromide (INCRUSE ELLIPTA) 62.5 MCG/ACT 1 puff  1 puff Inhalation Daily Marrion Coy, MD   1 puff at 03/02/23 0941   gabapentin (NEURONTIN) capsule 100 mg  100 mg Oral TID Marrion Coy, MD   100 mg at 03/02/23 0836   guaiFENesin-dextromethorphan (ROBITUSSIN DM) 100-10 MG/5ML syrup 5 mL  5 mL Oral Q4H PRN Alford Highland, MD   5 mL at 02/23/23 1345   hydrOXYzine (ATARAX) tablet 25 mg  25 mg Oral TID PRN Marrion Coy, MD   25 mg at 03/01/23 1007   ipratropium-albuterol (DUONEB) 0.5-2.5 (3) MG/3ML nebulizer solution 3 mL  3 mL Nebulization TID Marrion Coy, MD   3 mL at 03/02/23 1331   lidocaine (LIDODERM) 5 % 1 patch  1 patch Transdermal Q24H Marrion Coy, MD   1 patch at 03/01/23 0909   losartan (COZAAR) tablet 12.5 mg  12.5 mg Oral Daily Antonieta Iba, MD   12.5 mg at 03/02/23 0940   [START ON  03/03/2023] metoprolol succinate (TOPROL-XL) 24 hr tablet 100 mg  100 mg Oral Daily Antonieta Iba, MD       nitroGLYCERIN (NITROSTAT) SL tablet 0.4 mg  0.4 mg Sublingual Q5 min PRN Alford Highland, MD   0.4 mg at 02/28/23 2307   ondansetron (ZOFRAN) injection 4 mg  4 mg Intravenous Q4H PRN Mansy, Jan A, MD   4 mg at 02/19/23 2135   pantoprazole (PROTONIX) EC tablet 40 mg  40 mg Oral Daily Mansy, Jan A, MD   40 mg at 03/02/23 0836   polyethylene glycol (MIRALAX / GLYCOLAX) packet 17 g  17 g Oral BID Alford Highland, MD   17 g at 03/02/23 0837   senna-docusate (Senokot-S) tablet 2 tablet  2 tablet  Oral BID Marrion Coy, MD   2 tablet at 03/02/23 0836   tamsulosin (FLOMAX) capsule 0.4 mg  0.4 mg Oral QPC supper Enedina Finner, MD       torsemide [facility]) tablet 40 mg  40 mg Oral Daily Antonieta Iba, MD   40 mg at 03/02/23 0835   traMADol (ULTRAM) tablet 100 mg  100 mg Oral Q6H PRN Enedina Finner, MD   100 mg at 03/02/23 0036     Discharge Medications: Please see discharge summary for a list of discharge medications.  Relevant Imaging Results:  Relevant Lab Results:   Additional Information SSN: [id number]  Truddie Hidden, RN

## 2023-03-02 NOTE — Progress Notes (Signed)
[facility]  - [place] at [facility]   PATIENT NAME: Erik [name]    MR#:  [id number]  DATE OF BIRTH:  Mar 30, 1956  SUBJECTIVE:   No family at bedside. Spoke with wife [name] on the phone about discharge planning. Patient is refusingto not to go to rehab.requires lot of help with PT  VITALS:  Blood pressure 116/79, pulse 87, temperature 97.8 F (36.6 C), temperature source Oral, resp. rate 20, height 5\' 10"  (1.778 m), weight 100.7 kg, SpO2 92%.  PHYSICAL EXAMINATION:   GENERAL:  67 y.o.-year-old patient with no acute distress. Morbidly obese LUNGS: distant breath sounds bilaterally, no wheezing CARDIOVASCULAR: S1, S2 normal. No murmur tachycardia ABDOMEN: Soft, nontender, nondistended. Bowel sounds present.  EXTREMITIES: 1+ edema b/l.    NEUROLOGIC: nonfocal  patient is alert and awake SKIN: No obvious rash, lesion, or ulcer.   LABORATORY PANEL:  CBC Recent Labs  Lab 03/01/23 0504  WBC 19.8*  HGB 14.5  HCT 47.4  PLT 340    Chemistries  Recent Labs  Lab 02/28/23 2029 03/02/23 0749  NA 133* 130*  K 5.4* 4.7  CL 94* 95*  CO2 31 31  GLUCOSE 160* 88  BUN 23 25*  CREATININE 0.89 0.85  CALCIUM 8.5* 8.3*  MG 2.5*  --    Cardiac Enzymes No results for input(s): "TROPONINI" in the last 168 hours. RADIOLOGY:  IR KYPHO THORACIC WITH BONE BIOPSY  Result Date: 03/01/2023 CLINICAL DATA:  Symptomatic compression fractures. Please perform image guided kyphoplasty for therapeutic purposes. EXAM: FLUOROSCOPIC GUIDED KYPHOPLASTY OF THE T11, L3 AND L5 VERTEBRAL BODIES COMPARISON:  Thoracic and lumbar spine MRI-02/22/2023 MEDICATIONS: As antibiotic prophylaxis, Ancef 3 gm IV was ordered pre-procedure and administered intravenously within 1 hour of incision. ANESTHESIA/SEDATION: As per the ICU staff FLUOROSCOPY: 14 minutes, 48 seconds (1,285 mGy) COMPLICATIONS: SIR LEVEL B - Normal therapy, includes overnight admission for observation. Procedure complicated by  patient's development of atrial fibrillation with rapid ventricular response, ultimately managed medically. PROCEDURE: The procedure, risks (including but not limited to bleeding, infection, organ damage), benefits, and alternatives were explained to the patient. Questions regarding the procedure were encouraged and answered. The patient understands and consents to the procedure. The patient has suffered a fracture of the T11, L3 and L5 vertebral bodies. It is recommended that patients aged 69 years or older be evaluated for possible testing or treatment of osteoporosis. A copy of this procedure report is sent to the patient's referring physician The patient was placed prone on the fluoroscopic table. The skin overlying the upper thoracic region was then prepped and draped in the usual sterile fashion. Maximal barrier sterile technique was utilized including caps, mask, sterile gowns, sterile gloves, sterile drape, hand hygiene and skin antiseptic. Intravenous Fentanyl and Versed were administered as conscious sedation during continuous cardiorespiratory monitoring by the radiology RN. The left pedicle at T11 was then infiltrated with 1% lidocaine followed by the advancement of a Kyphon trocar needle through the left pedicle into the posterior one-third of the vertebral body. The trocar was removed and a bone biopsy was obtained at this location. Subsequently, the osteo drill was advanced to the anterior third of the vertebral body. The osteo drill was retracted. Through the working cannula, a Kyphon inflatable bone tamp 15 x 3 was advanced and positioned with the distal marker approximately 5 mm from the anterior aspect of the cortex. Appropriate positioning was confirmed on the AP projection. The identical procedure was repeated at the L3 vertebral body via  left transpedicular approach and at the L5 vertebral body via a right transpedicular approach. At this time, the balloons were expanded using contrast via a  Kyphon inflation syringe device via micro tubing. Inflations were continued until there was near apposition with the superior end plate. Methylmethacrylate mixture was reconstituted in the Kyphon bone mixing device system. This was then loaded into the delivery mechanism, attached to Kyphon bone fillers. The balloons were deflated and removed followed by the instillation of methylmethacrylate mixture with excellent filling in the AP and lateral projections. No extravasation was noted in the disk spaces or posteriorly into the spinal canal. No epidural venous contamination was seen. The working cannulae and the bone filler were then retrieved and removed. Hemostasis was achieved with manual compression. The patient was imaged with the hypercarbic and tachycardic necessitating the prompt conclusion of the procedure. The patient otherwise tolerated the procedure well without immediate postprocedural complication. IMPRESSION: 1. Technically successful T11, L3 and L5 vertebral body augmentation using balloon kyphoplasty. 2. Per CMS PQRS reporting requirements (PQRS Measure 24): Given the patient's age of greater than 50 and the fracture site (hip, distal radius, or spine), the patient should be tested for osteoporosis using DXA, and the appropriate treatment considered based on the DXA results. Electronically Signed   By: Simonne Come M.D.   On: 03/01/2023 09:38    Assessment and Plan  Erik [name] is a 67 y.o. male with medical history significant of paroxysmal atrial fibrillation not on anticoagulation, chronic systolic congestive heart failure with ejection fraction 35 to 40%, moderate pulm hypertension, COPD, chronic hypoxemia on 4 L oxygen, coronary disease, essential hypertension, who present to the hospital with complaints of worsening wounds in the right leg.    Severe back pain T11, L3 and L5 compression fractures.  Pain control.   --Physical therapy recommending rehab-- patient has been refusing rehab.  Will arrange home health PT. --s/p  3 level kyphoplasty on 7/23 -- restart aspirin and Plavix    Chronic respiratory failure with hypoxia (HCC) --Baseline 2 L nasal cannula.     COPD exacerbation (HCC) --Chronic respiratory failure on oxygen 2 L.  Completed prednisone.   --Continue inhalers and nebulizers.    Acute pain of left knee --Could be gout even though uric acid not high. Received Solu-Medrol x2  Hemoptysis Only slight.  Likely from coughing.  Will give cough medication Robitussin and Tessalon Perles.  Patient will end up needing a repeat CT scan in 2 to 3 months.   -- Resolved   Sepsis (HCC) Present on admission with right lower extremity cellulitis.  Patient had tachycardia tachypnea and leukocytosis and mildly elevated lactic acid.  Completed doxycycline.   Venous ulcer (HCC) --Will need follow-up at the wound care center.   PAD (peripheral artery disease) (HCC) ---aspirin and Plavix on 7/24. -- Patient will need to follow-up vascular surgery as outpatient.   Paroxysmal atrial fibrillation with RVR (HCC) -- continue metoprolol,digoxin  Chronic systolic CHF (congestive heart failure) (HCC) --Last EF 35% patient on Toprol, Demadex, Farxiga    Obesity (BMI 30-39.9) --BMI 31.85   Multiple lung nodules --Recommend repeating a CT scan in a few months to ensure clearing. Defer to PCP  Patient is at high risk for readmission given his multiple comorbidities. He is encouraged to consider rehab however he is so far adamant and refusing rehab His wife Babette Relic is aware. TOC for discharge planning to home with home health.    Procedures:successful cement augmentation of the T11, L3  and L5 vertebral bodies on 7/23 by IR   Family communication : wife [name] on the phone 7/25 Consults : IR, [facility] cardiology CODE STATUS: full DVT Prophylaxis : SCD, ambulation Level of care: Telemetry Cardiac Status is: Inpatient Remains inpatient appropriate because: pt is medically [place] at  baseline. Awaiting d/c planning per [facility] for transportation since wife is unable to provide transportation    TOTAL TIME TAKING CARE OF THIS PATIENT: 35 minutes.  >50% time spent on counselling and coordination of care  Note: This dictation was prepared with Dragon dictation along with smaller phrase technology. Any transcriptional errors that result from this process are unintentional.  Enedina Finner M.D    [facility]   CC: Primary care physician; Pcp, No

## 2023-03-02 NOTE — Progress Notes (Signed)
Rounding Note    Patient Name: Erik [name] Date of Encounter: 03/02/2023  [facility] Cardiologist: Lorine Bears, MD   Subjective   Continued back discomfort Reports breathing is somewhat improved after IV Lasix yesterday, back on torsemide 40 today his regular outpatient dose -Metoprolol succinate dosing increased from 50 up to 75 yesterday for better rate control Telemetry reviewed, remains in atrial fibrillation rate 100 up to 120 at rest   Inpatient Medications    Scheduled Meds:  aspirin EC  81 mg Oral Daily   atorvastatin  80 mg Oral Daily   benzonatate  100 mg Oral TID   clopidogrel  75 mg Oral Daily   dapagliflozin propanediol  10 mg Oral Daily   enoxaparin (LOVENOX) injection  0.5 mg/kg Subcutaneous Q24H   fluticasone furoate-vilanterol  1 puff Inhalation Daily   And   umeclidinium bromide  1 puff Inhalation Daily   gabapentin  100 mg Oral TID   ipratropium-albuterol  3 mL Nebulization TID   lidocaine  1 patch Transdermal Q24H   losartan  12.5 mg Oral Daily   [START ON 03/03/2023] metoprolol succinate  100 mg Oral Daily   pantoprazole  40 mg Oral Daily   polyethylene glycol  17 g Oral BID   senna-docusate  2 tablet Oral BID   torsemide  40 mg Oral Daily   Continuous Infusions:  PRN Meds: acetaminophen, albuterol, alum & mag hydroxide-simeth, alum & mag hydroxide-simeth, fentaNYL (SUBLIMAZE) injection, guaiFENesin-dextromethorphan, hydrOXYzine, metoprolol tartrate, nitroGLYCERIN, ondansetron (ZOFRAN) IV, traMADol   Vital Signs    Vitals:   03/02/23 0020 03/02/23 0313 03/02/23 0757 03/02/23 0827  BP: 122/84 130/89  127/81  Pulse: 82  75 84  Resp: 20 18 20  (!) 21  Temp: 97.6 F (36.4 C) 97.6 F (36.4 C)  98 F (36.7 C)  TempSrc:  Oral  Oral  SpO2: 99%  99% 95%  Weight:      Height:        Intake/Output Summary (Last 24 hours) at 03/02/2023 1133 Last data filed at 03/02/2023 1029 Gross per 24 hour  Intake 830 ml  Output 1100 ml   Net -270 ml      02/28/2023   11:25 AM 02/16/2023    9:42 AM 09/08/2022    8:50 AM  Last 3 Weights  Weight (lbs) 222 lb 0.1 oz 222 lb 245 lb 6 oz  Weight (kg) 100.7 kg 100.699 kg 111.3 kg      Telemetry    Atrial fibrillation rate 100 up to 120- Personally Reviewed  ECG     - Personally Reviewed  Physical Exam   GEN: No acute distress.   Neck: No JVD Cardiac: Irregularly irregular no murmurs, rubs, or gallops.  Respiratory: Clear to auscultation bilaterally. GI: Soft, nontender, non-distended  MS: No edema; No deformity. Neuro:  Nonfocal  Psych: Normal affect   Labs    High Sensitivity Troponin:   Recent Labs  Lab 02/16/23 0943 02/16/23 1307 02/28/23 2029 02/28/23 2235  TROPONINIHS 9 9 23* 24*     Chemistry Recent Labs  Lab 02/27/23 0547 02/28/23 2029 03/02/23 0749  NA 132* 133* 130*  K 4.2 5.4* 4.7  CL 88* 94* 95*  CO2 38* 31 31  GLUCOSE 108* 160* 88  BUN 27* 23 25*  CREATININE 0.85 0.89 0.85  CALCIUM 8.5* 8.5* 8.3*  MG  --  2.5*  --   GFRNONAA >60 >60 >60  ANIONGAP 6 8 4*    Lipids  No results for input(s): "CHOL", "TRIG", "HDL", "LABVLDL", "LDLCALC", "CHOLHDL" in the last 168 hours.  Hematology Recent Labs  Lab 02/27/23 0547 02/28/23 0428 03/01/23 0504  WBC 15.9* 20.4* 19.8*  RBC 5.12 5.07 5.11  HGB 14.3 14.6 14.5  HCT 46.3 45.2 47.4  MCV 90.4 89.2 92.8  MCH 27.9 28.8 28.4  MCHC 30.9 32.3 30.6  RDW 13.8 13.9 14.0  PLT 348 352 340   Thyroid  Recent Labs  Lab 03/01/23 0504  TSH 1.070    BNPNo results for input(s): "BNP", "PROBNP" in the last 168 hours.  DDimer  Recent Labs  Lab 02/28/23 2334  DDIMER 2.44*     Radiology    IR KYPHO THORACIC WITH BONE BIOPSY  Result Date: 03/01/2023 CLINICAL DATA:  Symptomatic compression fractures. Please perform image guided kyphoplasty for therapeutic purposes. EXAM: FLUOROSCOPIC GUIDED KYPHOPLASTY OF THE T11, L3 AND L5 VERTEBRAL BODIES COMPARISON:  Thoracic and lumbar spine MRI-02/22/2023  MEDICATIONS: As antibiotic prophylaxis, Ancef 3 gm IV was ordered pre-procedure and administered intravenously within 1 hour of incision. ANESTHESIA/SEDATION: As per the ICU staff FLUOROSCOPY: 14 minutes, 48 seconds (1,285 mGy) COMPLICATIONS: SIR LEVEL B - Normal therapy, includes overnight admission for observation. Procedure complicated by patient's development of atrial fibrillation with rapid ventricular response, ultimately managed medically. PROCEDURE: The procedure, risks (including but not limited to bleeding, infection, organ damage), benefits, and alternatives were explained to the patient. Questions regarding the procedure were encouraged and answered. The patient understands and consents to the procedure. The patient has suffered a fracture of the T11, L3 and L5 vertebral bodies. It is recommended that patients aged 52 years or older be evaluated for possible testing or treatment of osteoporosis. A copy of this procedure report is sent to the patient's referring physician The patient was placed prone on the fluoroscopic table. The skin overlying the upper thoracic region was then prepped and draped in the usual sterile fashion. Maximal barrier sterile technique was utilized including caps, mask, sterile gowns, sterile gloves, sterile drape, hand hygiene and skin antiseptic. Intravenous Fentanyl and Versed were administered as conscious sedation during continuous cardiorespiratory monitoring by the radiology RN. The left pedicle at T11 was then infiltrated with 1% lidocaine followed by the advancement of a Kyphon trocar needle through the left pedicle into the posterior one-third of the vertebral body. The trocar was removed and a bone biopsy was obtained at this location. Subsequently, the osteo drill was advanced to the anterior third of the vertebral body. The osteo drill was retracted. Through the working cannula, a Kyphon inflatable bone tamp 15 x 3 was advanced and positioned with the distal marker  approximately 5 mm from the anterior aspect of the cortex. Appropriate positioning was confirmed on the AP projection. The identical procedure was repeated at the L3 vertebral body via left transpedicular approach and at the L5 vertebral body via a right transpedicular approach. At this time, the balloons were expanded using contrast via a Kyphon inflation syringe device via micro tubing. Inflations were continued until there was near apposition with the superior end plate. Methylmethacrylate mixture was reconstituted in the Kyphon bone mixing device system. This was then loaded into the delivery mechanism, attached to Kyphon bone fillers. The balloons were deflated and removed followed by the instillation of methylmethacrylate mixture with excellent filling in the AP and lateral projections. No extravasation was noted in the disk spaces or posteriorly into the spinal canal. No epidural venous contamination was seen. The working cannulae and the bone filler  were then retrieved and removed. Hemostasis was achieved with manual compression. The patient was imaged with the hypercarbic and tachycardic necessitating the prompt conclusion of the procedure. The patient otherwise tolerated the procedure well without immediate postprocedural complication. IMPRESSION: 1. Technically successful T11, L3 and L5 vertebral body augmentation using balloon kyphoplasty. 2. Per CMS PQRS reporting requirements (PQRS Measure 24): Given the patient's age of greater than 50 and the fracture site (hip, distal radius, or spine), the patient should be tested for osteoporosis using DXA, and the appropriate treatment considered based on the DXA results. Electronically Signed   By: Simonne Come M.D.   On: 03/01/2023 09:38    Cardiac Studies     Patient Profile     67 y.o. male with history of CAD status post BMS in 2009 to OM 2 and CTO of the proximal RCA by LHC in 2016, permanent A-fib dating back to 2012, HFrEF, chronic hypoxic  respiratory failure on supplemental oxygen at baseline, HLD, COPD, and arthritis who was admitted on 02/16/2023 with worsening wounds on the right leg as well as back pain. He was noted to have multilevel compression fractures and underwent kyphoplasty 02/28/2023 due to refractory pain, and we are seeing for A-fib with RVR during kyphoplasty.   Assessment & Plan    Permanent atrial fibrillation with RVR Continues to have elevated rate despite increasing metoprolol succinate up to 75 daily yesterday -Will recommend we increase metoprolol succinate up to 100 daily -Increase digoxin up to 0.25 daily given normal renal function, subtherapeutic digoxin level -Notes indicating he stopped Eliquis as outpatient secondary to side effects, possible rash, no desire to go back on NOAC, also with history of falls   Shortness of breath, cardiomyopathy with history of ischemic cardiomyopathy Ejection fraction 35 to 40%, history of pulmonary hypertension, chronic hypoxia on 4 L oxygen History of COPD, emphysema with chronic scarring/atelectasis at the bases on CT scan -Given IV Lasix yesterday, torsemide 40 today -Repeat echo pending -Digoxin up to 0.25, metoprolol succinate up to 100, start losartan 12.5 daily, on Farxiga   Coronary artery disease with stable angina Denies anginal symptoms Low nontrending troponin No plan for ischemic workup at this time Started on Lipitor, on aspirin Plavix   Compression fracture Status post kyphoplasty Back brace in place   Total encounter time more than 35 minutes  Greater than 50% was spent in counseling and coordination of care with the patient   For questions or updates, please contact [facility] Please consult [email or web address] for contact info under        Signed, Julien Nordmann, MD  03/02/2023, 11:33 AM

## 2023-03-02 NOTE — Progress Notes (Signed)
Referring Physician(s): Alford Highland, MD   Supervising Physician: Simonne Come  Patient Status:  Erik [name] - In-pt  Reason for visit: Subacute compression fractures with uncontrolled back pain S/p vertebral augmentation with kyphoplasty T11, L3 and L5 on 7/23  Subjective: Patient states he has noticed some improvement in his back pain when ambulating to the restroom or sitting up in bed. He states he has not walked since procedure but overall he thinks he has noticed an improvement.  Allergies: Benadryl [diphenhydramine hcl], Morphine and codeine, and Oxycodone  Medications: Prior to Admission medications   Medication Sig Start Date End Date Taking? Authorizing Provider  acetaminophen (TYLENOL) 325 MG tablet Take 2 tablets (650 mg total) by mouth every 6 (six) hours as needed for mild pain (or Fever >/= 101). 09/07/22  Yes Hollice Espy, MD  albuterol (VENTOLIN HFA) 108 (90 Base) MCG/ACT inhaler Inhale 1 puff into the lungs every 6 (six) hours as needed for wheezing or shortness of breath. 02/17/22  Yes Lyndon Code, MD  aspirin EC 81 MG tablet Take 1 tablet (81 mg total) by mouth daily. Swallow whole. 09/08/22  Yes Hollice Espy, MD  clopidogrel (PLAVIX) 75 MG tablet Take 1 tablet (75 mg total) by mouth daily. 12/02/22  Yes Georgiana Spinner, NP  leptospermum manuka honey (MEDIHONEY) PSTE paste Apply 1 Application topically daily. 07/20/22  Yes Darlin Priestly, MD  metoprolol tartrate (LOPRESSOR) 100 MG tablet Take 1 tablet (100 mg total) by mouth 2 (two) times daily. 09/07/22  Yes Hollice Espy, MD  oxymetazoline (AFRIN) 0.05 % nasal spray Place 1 spray into both nostrils 2 (two) times daily. 09/07/22  Yes Hollice Espy, MD  polyethylene glycol (MIRALAX / GLYCOLAX) 17 g packet Take 17 g by mouth daily. 09/08/22  Yes Hollice Espy, MD  torsemide (DEMADEX) 20 MG tablet Take 2 tablets (40 mg total) by mouth 2 (two) times daily for 3 days, THEN 2 tablets (40 mg total) daily.  09/08/22 02/16/23 Yes Hollice Espy, MD  TRELEGY ELLIPTA 100-62.5-25 MCG/ACT AEPB INHALE 1 PUFF BY MOUTH INTO LUNGS DAILY 05/25/22  Yes Abernathy, Alyssa, NP  gabapentin (NEURONTIN) 100 MG capsule Take 100 mg by mouth 3 (three) times daily. Patient not taking: Reported on 02/16/2023 10/15/22   [provider]  hydrOXYzine (ATARAX) 25 MG tablet Take 1 tablet (25 mg total) by mouth 3 (three) times daily as needed for anxiety. Patient not taking: Reported on 02/16/2023 07/19/22   Darlin Priestly, MD  ipratropium-albuterol (DUONEB) 0.5-2.5 (3) MG/3ML SOLN USE 1 VIAL IN NEBULIZER EVERY 6 HOURS Patient not taking: Reported on 02/16/2023 01/27/23   Sallyanne Kuster, NP  traMADol (ULTRAM) 50 MG tablet Take 50 mg by mouth every 6 (six) hours as needed. Patient not taking: Reported on 02/16/2023 10/12/22   [provider]    Vital Signs: BP 116/79   Pulse 87   Temp 97.8 F (36.6 C) (Oral)   Resp 20   Ht 5\' 10"  (1.778 m)   Wt 222 lb 0.1 oz (100.7 kg)   SpO2 92%   BMI 31.85 kg/m   Physical Exam General: Alert, NAD, PT in room helping patient ambulate  Back: Dressings intact with old blood on dressing, minimal TTP  Imaging: IR KYPHO THORACIC WITH BONE BIOPSY  Result Date: 03/01/2023 CLINICAL DATA:  Symptomatic compression fractures. Please perform image guided kyphoplasty for therapeutic purposes. EXAM: FLUOROSCOPIC GUIDED KYPHOPLASTY OF THE T11, L3 AND L5 VERTEBRAL BODIES COMPARISON:  Thoracic and  lumbar spine MRI-02/22/2023 MEDICATIONS: As antibiotic prophylaxis, Ancef 3 gm IV was ordered pre-procedure and administered intravenously within 1 hour of incision. ANESTHESIA/SEDATION: As per the ICU staff FLUOROSCOPY: 14 minutes, 48 seconds (1,285 mGy) COMPLICATIONS: SIR LEVEL B - Normal therapy, includes overnight admission for observation. Procedure complicated by patient's development of atrial fibrillation with rapid ventricular response, ultimately managed medically. PROCEDURE: The  procedure, risks (including but not limited to bleeding, infection, organ damage), benefits, and alternatives were explained to the patient. Questions regarding the procedure were encouraged and answered. The patient understands and consents to the procedure. The patient has suffered a fracture of the T11, L3 and L5 vertebral bodies. It is recommended that patients aged 104 years or older be evaluated for possible testing or treatment of osteoporosis. A copy of this procedure report is sent to the patient's referring physician The patient was placed prone on the fluoroscopic table. The skin overlying the upper thoracic region was then prepped and draped in the usual sterile fashion. Maximal barrier sterile technique was utilized including caps, mask, sterile gowns, sterile gloves, sterile drape, hand hygiene and skin antiseptic. Intravenous Fentanyl and Versed were administered as conscious sedation during continuous cardiorespiratory monitoring by the radiology RN. The left pedicle at T11 was then infiltrated with 1% lidocaine followed by the advancement of a Kyphon trocar needle through the left pedicle into the posterior one-third of the vertebral body. The trocar was removed and a bone biopsy was obtained at this location. Subsequently, the osteo drill was advanced to the anterior third of the vertebral body. The osteo drill was retracted. Through the working cannula, a Kyphon inflatable bone tamp 15 x 3 was advanced and positioned with the distal marker approximately 5 mm from the anterior aspect of the cortex. Appropriate positioning was confirmed on the AP projection. The identical procedure was repeated at the L3 vertebral body via left transpedicular approach and at the L5 vertebral body via a right transpedicular approach. At this time, the balloons were expanded using contrast via a Kyphon inflation syringe device via micro tubing. Inflations were continued until there was near apposition with the superior  end plate. Methylmethacrylate mixture was reconstituted in the Kyphon bone mixing device system. This was then loaded into the delivery mechanism, attached to Kyphon bone fillers. The balloons were deflated and removed followed by the instillation of methylmethacrylate mixture with excellent filling in the AP and lateral projections. No extravasation was noted in the disk spaces or posteriorly into the spinal canal. No epidural venous contamination was seen. The working cannulae and the bone filler were then retrieved and removed. Hemostasis was achieved with manual compression. The patient was imaged with the hypercarbic and tachycardic necessitating the prompt conclusion of the procedure. The patient otherwise tolerated the procedure well without immediate postprocedural complication. IMPRESSION: 1. Technically successful T11, L3 and L5 vertebral body augmentation using balloon kyphoplasty. 2. Per CMS PQRS reporting requirements (PQRS Measure 24): Given the patient's age of greater than 50 and the fracture site (hip, distal radius, or spine), the patient should be tested for osteoporosis using DXA, and the appropriate treatment considered based on the DXA results. Electronically Signed   By: Simonne Come M.D.   On: 03/01/2023 09:38    Labs:  CBC: Recent Labs    02/26/23 0450 02/27/23 0547 02/28/23 0428 03/01/23 0504  WBC 16.2* 15.9* 20.4* 19.8*  HGB 15.8 14.3 14.6 14.5  HCT 50.6 46.3 45.2 47.4  PLT 433* 348 352 340    COAGS: Recent  Labs    09/25/22 1021 02/16/23 0943 02/17/23 0756 02/26/23 1239 02/26/23 1457  INR 1.0 1.1 1.0 1.1  --   APTT 30 30  --  >200* 164*    BMP: Recent Labs    02/26/23 0450 02/27/23 0547 02/28/23 2029 03/02/23 0749  NA 133* 132* 133* 130*  K 4.3 4.2 5.4* 4.7  CL 85* 88* 94* 95*  CO2 35* 38* 31 31  GLUCOSE 108* 108* 160* 88  BUN 28* 27* 23 25*  CALCIUM 9.0 8.5* 8.5* 8.3*  CREATININE 0.93 0.85 0.89 0.85  GFRNONAA >60 >60 >60 >60    LIVER FUNCTION  TESTS: Recent Labs    09/06/22 0647 09/07/22 0135 09/25/22 1021 02/16/23 0943  BILITOT 0.6 0.7 1.0 1.1  AST 46* 34 28 22  ALT 70* 57* 19 11  ALKPHOS 86 91 121 100  PROT 5.0* 5.2* 7.0 6.8  ALBUMIN 2.5* 2.6* 3.5 3.4*    Assessment and Plan: Subacute compression fractures with uncontrolled back pain after injury while getting out of bed 7/11 S/p vertebral augmentation with kyphoplasty T11, L3 and L5 on 7/23 Procedure site without complication, dressings can be removed today or tomorrow with band-aid applied at site.  Patient states he has noticed some improvement in his back pain, should have maximum benefit within 2 weeks time.  Leukocytosis - unclear etiology, patient is afebrile- would monitor closely.  Recommend back brace as needed and continue to encourage PT.  Electronically Signed: Berneta Levins, PA-C 03/02/2023, 12:27 PM   I spent a total of 15 Minutes at the the patient's bedside AND on the patient's [name] floor or unit, greater than 50% of which was counseling/coordinating care for s/p vertebral augmentation with kyphoplasty T11, L3 and L5.

## 2023-03-02 NOTE — Care Management Important Message (Signed)
Important Message  Patient Details  Name: Erik [name] MRN: [id number] Date of Birth: 10-15-1955   Medicare Important Message Given:  Yes     Johnell Comings 03/02/2023, 1:21 PM

## 2023-03-02 NOTE — TOC Progression Note (Addendum)
Transition of Care [facility]) - Progression Note    Patient Details  Name: Erik [name] MRN: [id number] Date of Birth: June 01, 1956  Transition of Care [facility]) CM/SW Contact  Truddie Hidden, RN Phone Number: 03/02/2023, 1:17 PM  Clinical Narrative:    Spoke with patient at bedside regarding SNF recommendation. Patient is not agreeable to return to SNF and especially [facility]. RNCM attempted to discuss HH. Patient appeared very anxious and not wanting to discuss his discharge plan. RNCM to follow up.   Expected Discharge Plan: Home w Home Health Services Barriers to Discharge: No Barriers Identified  Expected Discharge Plan and Services   Discharge Planning Services: CM Consult   Living arrangements for the past 2 months: Single Family Home                 DME Arranged: Walker rolling with seat DME Agency: [employer] Date DME Agency Contacted: 02/17/23 Time DME Agency Contacted: [phone] Representative spoke with at DME Agency: [facility] HH Arranged: RN, PT HH Agency: [facility] Date [facility] Agency Contacted: 02/20/23 Time HH Agency Contacted: 1005 Representative spoke with at [facility] Agency: [facility]   Social Determinants of Health (SDOH) Interventions SDOH Screenings   Food Insecurity: No Food Insecurity (02/16/2023)  Housing: Low Risk  (02/16/2023)  Transportation Needs: No Transportation Needs (02/16/2023)  Utilities: Not At Risk (02/16/2023)  Alcohol Screen: Low Risk  (02/19/2020)  Depression (PHQ2-9): Low Risk  (08/25/2020)  Financial Resource Strain: Low Risk  (11/04/2020)  Tobacco Use: Medium Risk (02/28/2023)    Readmission Risk Interventions     No data to display

## 2023-03-02 NOTE — TOC Progression Note (Addendum)
Transition of Care [facility]) - Progression Note    Patient Details  Name: Erik [name] MRN: [id number] Date of Birth: [birth year]  Transition of Care [facility]) CM/SW Contact  Truddie Hidden, RN Phone Number: 03/02/2023, 1:49 PM  Clinical Narrative:    Erik [name] with Erik [name] from [facility]. Patient care declined related to being out of service area.  Spoke with patient regarding his discharge plan. He is not agreeable to SNF per therapy recommendations. He  was declined services with [facility] and [facility] related to noncompliance with MD appointments per TOC note last week. He is agreeable to outpatient therapy.  Patient was advised of discharge today. He stated he was unaware of his plan to discharge today. Patient's wife is unable to transport him. RNCM advised EMS could be arranged but a bill would be incurred. Patient is not agreeable to EMS transport. He states "This is a waste of my time." RNCM reiterated the services he has refused and his current options. He is now agreeable to SNF except [facility]. MD notified.  4:22pm Bed search initiated.    Expected Discharge Plan: Home w Home Health Services Barriers to Discharge: No Barriers Identified  Expected Discharge Plan and Services   Discharge Planning Services: CM Consult   Living arrangements for the past 2 months: Single Family Home                 DME Arranged: Walker rolling with seat DME Agency: [employer] Date DME Agency Contacted: 02/17/23 Time DME Agency Contacted: [phone] Representative spoke with at DME Agency: Erik [name] Arranged: RN, PT [name] Agency: [facility] Date [facility] Agency Contacted: 02/20/23 Time [name] Agency Contacted: 1005 Representative spoke with at [facility] Agency: Erik [name]   Social Determinants of Health (SDOH) Interventions SDOH Screenings   Food Insecurity: No Food Insecurity (02/16/2023)  Housing: Low Risk  (02/16/2023)  Transportation Needs: No Transportation Needs (02/16/2023)  Utilities: Not At Risk (02/16/2023)   Alcohol Screen: Low Risk  (02/19/2020)  Depression (PHQ2-9): Low Risk  (08/25/2020)  Financial Resource Strain: Low Risk  (11/04/2020)  Tobacco Use: Medium Risk (02/28/2023)    Readmission Risk Interventions     No data to display

## 2023-03-02 NOTE — Progress Notes (Signed)
Physical Therapy Treatment Patient Details Name: Erik [name] MRN: [id number] DOB: 08/20/1955 Today's Date: 03/02/2023   History of Present Illness Pt is a 67 y.o. male past medical history significant for peripheral arterial disease, CAD, CHF, atrial fibrillation, COPD. Admitted due to SOB, cellulitis of LE, and a venous ulcer.  Pt with T11 and L3 compression facture now s/p kyphoplasthy 7/23.    PT Comments  Patient is agreeable to PT. Patient still requires significant assistance with mobility. Patient reports his back pain has improved since the procedure. Patient able to take around 5 steps from bed to chair with further  activity tolerance limited by dyspnea, anxiety, elevated heart rate. Anticipate the need for frequent physical assistance required after this hospital stay. PT will continue to follow to maximize independence and facilitate return to prior level of function.     Assistance Recommended at Discharge Frequent or constant Supervision/Assistance  If plan is discharge home, recommend the following:  Can travel by private vehicle    A lot of help with walking and/or transfers;A lot of help with bathing/dressing/bathroom;Help with stairs or ramp for entrance;Assist for transportation;Assistance with cooking/housework   No  Equipment Recommendations   (to be determined at next level of care)    Recommendations for Other Services       Precautions / Restrictions Precautions Precautions: Fall Precaution Comments: back pain Required Braces or Orthoses: Spinal Brace Spinal Brace: Thoracolumbosacral orthotic;Applied in sitting position Restrictions Weight Bearing Restrictions: No     Mobility  Bed Mobility Overal bed mobility: Needs Assistance Bed Mobility: Supine to Sit     Supine to sit: Mod assist, +2 for physical assistance     General bed mobility comments: verbal cues for technique. assistance for LE and trunk support. TLSO donned with total assistance in  sitting prior to standing    Transfers Overall transfer level: Needs assistance Equipment used: Rolling walker (2 wheels) Transfers: Bed to chair/wheelchair/BSC     Step pivot transfers: Mod assist       General transfer comment: verbal cues for technique. assistance required for lifting from bed and for rolling walker negotiation. standing activity tolerance limited by shortness of breath. heart rate elevated with standing. cues for breathing techniques    Ambulation/Gait             Pre-gait activities: patient able to take around 5 steps from bed to chair with rolling walker. further ambulation distance is limited by fatigue, dyspna, elevated heart rate     Stairs             Wheelchair Mobility     Tilt Bed    Modified Rankin (Stroke Patients Only)       Balance Overall balance assessment: Needs assistance Sitting-balance support: Bilateral upper extremity supported Sitting balance-Leahy Scale: Fair Sitting balance - Comments: patient unable to maintain sitting balance without UE support.   Standing balance support: Bilateral upper extremity supported, During functional activity, Reliant on assistive device for balance Standing balance-Leahy Scale: Fair Standing balance comment: poor standing tolerance and heavy use of rolling walker in standing                            Cognition Arousal/Alertness: Awake/alert Behavior During Therapy: Anxious Overall Cognitive Status: Within Functional Limits for tasks assessed  Exercises      General Comments General comments (skin integrity, edema, etc.): heart rate up into the 140's with activity      Pertinent Vitals/Pain Pain Assessment Pain Assessment: Faces Faces Pain Scale: Hurts little more Pain Location: back Pain Descriptors / Indicators: Discomfort Pain Intervention(s): Limited activity within patient's tolerance, Monitored  during session, Repositioned    Home Living                          Prior Function            PT Goals (current goals can now be found in the care plan section) Acute Rehab PT Goals Patient Stated Goal: To get better PT Goal Formulation: With patient Time For Goal Achievement: 03/14/23 Potential to Achieve Goals: Fair Progress towards PT goals: Progressing toward goals    Frequency    Min 1X/week      PT Plan Current plan remains appropriate    Co-evaluation              AM-PAC PT "6 Clicks" Mobility   Outcome Measure  Help needed turning from your back to your side while in a flat bed without using bedrails?: A Little Help needed moving from lying on your back to sitting on the side of a flat bed without using bedrails?: A Lot Help needed moving to and from a bed to a chair (including a wheelchair)?: A Lot Help needed standing up from a chair using your arms (e.g., wheelchair or bedside chair)?: A Little Help needed to walk in hospital room?: A Lot Help needed climbing 3-5 steps with a railing? : Total 6 Click Score: 13    End of Session Equipment Utilized During Treatment: Oxygen Activity Tolerance: Patient limited by fatigue;Patient limited by pain Patient left: in chair;with call bell/phone within reach;with chair alarm set Nurse Communication: Mobility status (mobility tech, [name] in the room using session) PT Visit Diagnosis: Muscle weakness (generalized) (M62.81);Pain;Difficulty in walking, not elsewhere classified (R26.2);Unsteadiness on feet (R26.81)     Time: 9528-4132 PT Time Calculation (min) (ACUTE ONLY): 27 min  Charges:    $Therapeutic Activity: 23-37 mins PT General Charges $$ ACUTE PT VISIT: 1 Visit                    Donna Bernard, PT, MPT  Ina Homes 03/02/2023, 2:05 PM

## 2023-03-02 NOTE — Plan of Care (Signed)
  Problem: Education: Goal: Knowledge of General Education information will improve Description: Including pain rating scale, medication(s)/side effects and non-pharmacologic comfort measures Outcome: Progressing   Problem: Clinical Measurements: Goal: Signs and symptoms of infection will decrease Outcome: Progressing   Problem: Clinical Measurements: Goal: Respiratory complications will improve Outcome: Progressing   Problem: Activity: Goal: Risk for activity intolerance will decrease Outcome: Progressing   Problem: Pain Managment: Goal: General experience of comfort will improve Outcome: Progressing   Problem: Safety: Goal: Ability to remain free from injury will improve Outcome: Progressing

## 2023-03-03 ENCOUNTER — Other Ambulatory Visit: Payer: Self-pay

## 2023-03-03 DIAGNOSIS — I480 Paroxysmal atrial fibrillation: Secondary | ICD-10-CM | POA: Diagnosis not present

## 2023-03-03 DIAGNOSIS — I4821 Permanent atrial fibrillation: Secondary | ICD-10-CM | POA: Diagnosis not present

## 2023-03-03 DIAGNOSIS — S32050S Wedge compression fracture of fifth lumbar vertebra, sequela: Secondary | ICD-10-CM

## 2023-03-03 DIAGNOSIS — I5022 Chronic systolic (congestive) heart failure: Secondary | ICD-10-CM | POA: Diagnosis not present

## 2023-03-03 DIAGNOSIS — I2510 Atherosclerotic heart disease of native coronary artery without angina pectoris: Secondary | ICD-10-CM | POA: Diagnosis not present

## 2023-03-03 DIAGNOSIS — S32030S Wedge compression fracture of third lumbar vertebra, sequela: Secondary | ICD-10-CM

## 2023-03-03 MED ORDER — APIXABAN 5 MG PO TABS
5.0000 mg | ORAL_TABLET | Freq: Two times a day (BID) | ORAL | Status: DC
  Administered 2023-03-03 – 2023-03-04 (×3): 5 mg via ORAL
  Filled 2023-03-03 (×3): qty 1

## 2023-03-03 MED ORDER — LIDOCAINE 5 % EX PTCH
2.0000 | MEDICATED_PATCH | CUTANEOUS | Status: DC
  Filled 2023-03-03 (×2): qty 2

## 2023-03-03 NOTE — Plan of Care (Signed)
  Problem: Clinical Measurements: Goal: Signs and symptoms of infection will decrease Outcome: Progressing   Problem: Activity: Goal: Risk for activity intolerance will decrease Outcome: Progressing   Problem: Coping: Goal: Level of anxiety will decrease Outcome: Progressing   Problem: Pain Managment: Goal: General experience of comfort will improve Outcome: Progressing

## 2023-03-03 NOTE — TOC Progression Note (Addendum)
Transition of Care [facility]) - Progression Note    Patient Details  Name: Erik [name] MRN: [id number] Date of Birth: 1955/10/18  Transition [facility] Care [facility]) CM/SW Contact  Truddie Hidden, RN Phone Number: 03/03/2023, 10:55 AM  Clinical Narrative:    Spoke with patient regarding bed offer for [facility]. He is not agreeable to bed offer. He was advised of CMS guideline regarding bed offers and option to appeal once discharge orders have been submitted.  He stated he was being moved to another floor and would be admitted until Monday per his MD.   11:17pm Spoke with patient's wife regarding bed offer for [facility]. She stated she is not sure she could make it to [place]. She inquired about [facility]. She was advised no bed offer has been made from [facility]. Offer is pending. She was advised HH searches have been exhausted due to patient's noncompliance. She stated patient did not attend appointments due to the weather being too hot. Patient wife will speak with him to see if he would like to reconsider [facility].  11:21am Attempt to reach admissions coordinator for [facility], No answer. Left a message.  Attempt to reach Tanya in admissions at [facility], no answer.  Search extended to [facility].   11:45pm Per MD patient has accepted offer for [facility].  RNCM contacted [name] from [employer] to start auth.     Expected Discharge Plan: Home w Home Health Services Barriers to Discharge: No Barriers Identified  Expected Discharge Plan and Services   Discharge Planning Services: CM Consult   Living arrangements for the past 2 months: Single Family Home                 DME Arranged: Walker rolling with seat DME Agency: [employer] Date DME Agency Contacted: 02/17/23 Time DME Agency Contacted: [phone] Representative spoke with at DME Agency: [facility] HH Arranged: RN, PT HH Agency: [facility] Date [facility] Agency Contacted: 02/20/23 Time HH Agency Contacted:  [phone] Representative spoke with at [facility] Agency: [facility]   Social Determinants of Health (SDOH) Interventions SDOH Screenings   Food Insecurity: No Food Insecurity (02/16/2023)  Housing: Low Risk  (02/16/2023)  Transportation Needs: No Transportation Needs (02/16/2023)  Utilities: Not At Risk (02/16/2023)  Alcohol Screen: Low Risk  (02/19/2020)  Depression (PHQ2-9): Low Risk  (08/25/2020)  Financial Resource Strain: Low Risk  (11/04/2020)  Tobacco Use: Medium Risk (02/28/2023)    Readmission Risk Interventions     No data to display

## 2023-03-03 NOTE — Progress Notes (Signed)
Occupational Therapy Treatment Patient Details Name: Erik [name] MRN: [id number] DOB: 1956/01/25 Today's Date: 03/03/2023   History of present illness Pt is a 67 y.o. male past medical history significant for peripheral arterial disease, CAD, CHF, atrial fibrillation, COPD. Admitted due to SOB, cellulitis of LE, and a venous ulcer.  Pt with T11 and L3 compression facture now s/p kyphoplasthy 7/23.   OT comments  Upon entering the room, pt supine in bed with food in front of him and coughing on meal. OT noticed bed only elevated to 17 degrees and spoke to pt about HOB needing to be elevated further to decrease change of aspiration. Pt verbalized understanding. Pt needing assistance to get food items off of hospital gown. OT offered to assist pt up to chair to eat but he declines as he just recently returned to bed. Pt's RR in 30's and HR 130's while in bed speaking to therapist.  Pt then took phone call from family member and after waiting several minutes pt continues to talk therefore session terminated. Call bell and all needed items within reach upon exiting the room.    Recommendations for follow up therapy are one component of a multi-disciplinary discharge planning process, led by the attending physician.  Recommendations may be updated based on patient status, additional functional criteria and insurance authorization.    Assistance Recommended at Discharge Intermittent Supervision/Assistance  Patient can return home with the following  A lot of help with walking and/or transfers;A lot of help with bathing/dressing/bathroom;Assistance with cooking/housework;Assist for transportation;Help with stairs or ramp for entrance;Direct supervision/assist for medications management   Equipment Recommendations  Other (comment) (defer to next venue of care)       Precautions / Restrictions Precautions Precautions: Fall Precaution Comments: back pain Required Braces or Orthoses: Spinal  Brace Spinal Brace: Thoracolumbosacral orthotic;Applied in sitting position Restrictions Weight Bearing Restrictions: No              ADL either performed or assessed with clinical judgement    Extremity/Trunk Assessment Upper Extremity Assessment Upper Extremity Assessment: Generalized weakness   Lower Extremity Assessment Lower Extremity Assessment: Generalized weakness        Vision Patient Visual Report: No change from baseline            Cognition Arousal/Alertness: Awake/alert Behavior During Therapy: Anxious Overall Cognitive Status: Within Functional Limits for tasks assessed                                 General Comments: very anxious RR 30's and HR 130's with OT walking into room and speaking to pt.                   Pertinent Vitals/ Pain       Pain Assessment Pain Assessment: Faces Faces Pain Scale: Hurts a little bit Pain Location: back Pain Descriptors / Indicators: Discomfort Pain Intervention(s): Limited activity within patient's tolerance, Monitored during session, Repositioned         Frequency  Min 1X/week        Progress Toward Goals  OT Goals(current goals can now be found in the care plan section)  Progress towards OT goals: Progressing toward goals  Acute Rehab OT Goals OT Goal Formulation: With patient Time For Goal Achievement: 03/15/23 Potential to Achieve Goals: Good  Plan Discharge plan remains appropriate;Frequency remains appropriate       AM-PAC OT "6 Clicks" Daily Activity  Outcome Measure   Help from another person eating meals?: None Help from another person taking care of personal grooming?: A Little Help from another person toileting, which includes using toliet, bedpan, or urinal?: A Lot Help from another person bathing (including washing, rinsing, drying)?: A Lot Help from another person to put on and taking off regular upper body clothing?: A Lot Help from another person to put on  and taking off regular lower body clothing?: A Lot 6 Click Score: 15    End of Session Equipment Utilized During Treatment: Oxygen  OT Visit Diagnosis: Other abnormalities of gait and mobility (R26.89);Muscle weakness (generalized) (M62.81)   Activity Tolerance Patient tolerated treatment well   Patient Left with call bell/phone within reach;in bed;with bed alarm set   Nurse Communication          Time: 1324-4010 OT Time Calculation (min): 12 min  Charges: OT General Charges $OT Visit: 1 Visit OT Treatments $Self Care/Home Management : 8-22 mins  Jackquline Denmark, MS, OTR/L , CBIS ascom [phone]  03/03/23, 2:59 PM

## 2023-03-03 NOTE — Progress Notes (Signed)
Rounding Note    Patient Name: Erik [name] Date of Encounter: 03/03/2023  [facility] Cardiologist: Lorine Bears, MD   Subjective   Reports breathing is somewhat improved today when compared to yesterday. No chest pain or palpitations.  Not on telemetry. Documented UOP 900 mL for the past 24 hours, net - 19.3 L.   Inpatient Medications    Scheduled Meds:  apixaban  5 mg Oral BID   aspirin EC  81 mg Oral Daily   atorvastatin  80 mg Oral Daily   benzonatate  100 mg Oral TID   dapagliflozin propanediol  10 mg Oral Daily   fluticasone furoate-vilanterol  1 puff Inhalation Daily   And   umeclidinium bromide  1 puff Inhalation Daily   gabapentin  100 mg Oral TID   ipratropium-albuterol  3 mL Nebulization TID   lidocaine  2 patch Transdermal Q24H   losartan  12.5 mg Oral Daily   metoprolol succinate  100 mg Oral Daily   pantoprazole  40 mg Oral Daily   polyethylene glycol  17 g Oral BID   senna-docusate  2 tablet Oral BID   tamsulosin  0.4 mg Oral QPC supper   torsemide  40 mg Oral Daily   Continuous Infusions:  PRN Meds: acetaminophen, albuterol, alum & mag hydroxide-simeth, alum & mag hydroxide-simeth, guaiFENesin-dextromethorphan, hydrOXYzine, nitroGLYCERIN, ondansetron (ZOFRAN) IV, traMADol   Vital Signs    Vitals:   03/03/23 0034 03/03/23 0600 03/03/23 0716 03/03/23 0820  BP: 103/77 116/78  98/71  Pulse: 70 83  90  Resp: 18 18  20   Temp: 97.8 F (36.6 C) 97.9 F (36.6 C)    TempSrc:      SpO2: 98% 97% 99% 91%  Weight:      Height:        Intake/Output Summary (Last 24 hours) at 03/03/2023 1203 Last data filed at 03/03/2023 0900 Gross per 24 hour  Intake 440 ml  Output 980 ml  Net -540 ml      02/28/2023   11:25 AM 02/16/2023    9:42 AM 09/08/2022    8:50 AM  Last 3 Weights  Weight (lbs) 222 lb 0.1 oz 222 lb 245 lb 6 oz  Weight (kg) 100.7 kg 100.699 kg 111.3 kg      Telemetry    Not on telemetry - Personally Reviewed  ECG    No  new tracings - Personally Reviewed  Physical Exam   GEN: No acute distress.   Neck: No JVD Cardiac: Irregularly irregular no murmurs, rubs, or gallops.  Respiratory: Clear to auscultation bilaterally. GI: Soft, nontender, non-distended  MS: No edema; No deformity. Neuro:  Nonfocal  Psych: Normal affect   Labs    High Sensitivity Troponin:   Recent Labs  Lab 02/16/23 0943 02/16/23 1307 02/28/23 2029 02/28/23 2235  TROPONINIHS 9 9 23* 24*     Chemistry Recent Labs  Lab 02/27/23 0547 02/28/23 2029 03/02/23 0749  NA 132* 133* 130*  K 4.2 5.4* 4.7  CL 88* 94* 95*  CO2 38* 31 31  GLUCOSE 108* 160* 88  BUN 27* 23 25*  CREATININE 0.85 0.89 0.85  CALCIUM 8.5* 8.5* 8.3*  MG  --  2.5*  --   GFRNONAA >60 >60 >60  ANIONGAP 6 8 4*    Lipids No results for input(s): "CHOL", "TRIG", "HDL", "LABVLDL", "LDLCALC", "CHOLHDL" in the last 168 hours.  Hematology Recent Labs  Lab 02/27/23 0547 02/28/23 0428 03/01/23 0504  WBC 15.9*  20.4* 19.8*  RBC 5.12 5.07 5.11  HGB 14.3 14.6 14.5  HCT 46.3 45.2 47.4  MCV 90.4 89.2 92.8  MCH 27.9 28.8 28.4  MCHC 30.9 32.3 30.6  RDW 13.8 13.9 14.0  PLT 348 352 340   Thyroid  Recent Labs  Lab 03/01/23 0504  TSH 1.070    BNPNo results for input(s): "BNP", "PROBNP" in the last 168 hours.  DDimer  Recent Labs  Lab 02/28/23 2334  DDIMER 2.44*     Radiology     Cardiac Studies   2D echo 03/01/2023: 1. Left ventricular ejection fraction, by estimation, is 35 to 40%. The  left ventricle has moderately decreased function. The left ventricle  demonstrates global hypokinesis. Left ventricular diastolic parameters are  indeterminate.   2. Right ventricular systolic function is normal. The right ventricular  size is mildly enlarged. There is moderately elevated pulmonary artery  systolic pressure. The estimated right ventricular systolic pressure is  45.4 mmHg.   3. Left atrial size was severely dilated.   4. Right atrial size was  mildly dilated.   5. The mitral valve is normal in structure. No evidence of mitral valve  regurgitation. No evidence of mitral stenosis.   6. Tricuspid valve regurgitation is moderate.   7. The aortic valve is normal in structure. There is mild calcification  of the aortic valve. Aortic valve regurgitation is not visualized. Aortic  valve sclerosis/calcification is present, without any evidence of aortic  stenosis.   8. The inferior vena cava is normal in size with greater than 50%  respiratory variability, suggesting right atrial pressure of 3 mmHg.   Patient Profile     67 y.o. male with history of CAD status post BMS in 2009 to OM2 and CTO of the proximal RCA by LHC in 2016, permanent A-fib dating back to 2012, HFrEF, chronic hypoxic respiratory failure on supplemental oxygen at baseline, HLD, COPD, and arthritis who was admitted on 02/16/2023 with worsening wounds on the right leg as well as back pain. He was noted to have multilevel compression fractures and underwent kyphoplasty 02/28/2023 due to refractory pain, and we are seeing for A-fib with RVR during kyphoplasty.   Assessment & Plan    Permanent atrial fibrillation with RVR -By vital signs, ventricular rates are improved, not currently on telemetry, resume -Continue metoprolol succinate 100 mg daily  -He reports stopping Eliquis as outpatient secondary to brusing, agreeable to restarting Eliquis at this time -CHADS2VASc at least 3 (CHF, age x 1, vascular disease)    HFrEF/ICM: -Continues to report underlying dyspnea, appears euvolemic -Underlying dyspnea is likely multifactorial including pulmonary disease, cardiomyopathy, and A-fib -Defer elevated D-dimer to primary service -PTA torsemide 40 mg -Repeat echo pending -Metoprolol succinate 100 mg daily,  losartan 12.5 daily, and Farxiga 10 -Escalate GDMT with addition of spironolactone if potassium remains stable over the next 24 hours -Relative hypotension precludes  transition of ARB to ARNI at this time -CHF education   CAD involving the native coronary arteries with stable angina: -Denies anginal symptoms -Mildly elevated and flat trending high-sensitivity troponin, not consistent with ACS -No indication for heparin drip -No plan for ischemic workup at this time with recommendation to follow-up as outpatient for further discussion of cardiac risk stratification  -Aspirin and atorvastatin -LDL 89 in 08/2022 with goal LDL at least less than 70, now on statin  PAD: -Status post lower extremity stenting in 08/2022 -Transition from aspirin and Plavix to aspirin and Eliquis for stroke risk reduction given  A-fib -Followed by vascular surgery  Hyperkalemia: -Improved     For questions or updates, please contact [facility] Please consult [email or web address] for contact info under        Signed, Eula Listen, PA-C  03/03/2023, 12:03 PM

## 2023-03-03 NOTE — Progress Notes (Signed)
[facility]  - [place] [facility]   PATIENT NAME: Erik [name]    MR#:  [id number]  DATE OF BIRTH:  1956-03-13  SUBJECTIVE:   No family at bedside. Patient sitting out in the recliner chair. Virus quite a bit of help. I encouraged patient to consider [facility] for  rehab since no other place has offered bed. Pt is agreeable VITALS:  Blood pressure 98/71, pulse 90, temperature 97.9 F (36.6 C), resp. rate 20, height 5\' 10"  (1.778 m), weight 100.7 kg, SpO2 91%.  PHYSICAL EXAMINATION:   GENERAL:  67 y.o.-year-old patient with no acute distress. Morbidly obese LUNGS: distant breath sounds bilaterally, no wheezing CARDIOVASCULAR: S1, S2 normal. No murmur tachycardia ABDOMEN: Soft, nontender, nondistended. Bowel sounds present.  EXTREMITIES: 1+ edema b/l.    NEUROLOGIC: nonfocal  patient is alert and awake SKIN: No obvious rash, lesion, or ulcer.   LABORATORY PANEL:  CBC Recent Labs  Lab 03/01/23 0504  WBC 19.8*  HGB 14.5  HCT 47.4  PLT 340    Chemistries  Recent Labs  Lab 02/28/23 2029 03/02/23 0749  NA 133* 130*  K 5.4* 4.7  CL 94* 95*  CO2 31 31  GLUCOSE 160* 88  BUN 23 25*  CREATININE 0.89 0.85  CALCIUM 8.5* 8.3*  MG 2.5*  --    Cardiac Enzymes No results for input(s): "TROPONINI" in the last 168 hours. RADIOLOGY:  ECHOCARDIOGRAM COMPLETE  Result Date: 03/02/2023    ECHOCARDIOGRAM REPORT   Patient Name:   Erik [name] Date of Exam: 03/01/2023 Medical Rec #:  [id number]      Height:       70.0 in Accession #:    [id number]     Weight:       222.0 lb Date of Birth:  Mar 10, 1956     BSA:          2.182 m Patient Age:    66 years       BP:           152/84 mmHg Patient Gender: M              HR:           51 bpm. Exam Location:  [facility] Procedure: 2D Echo, Cardiac Doppler and Color Doppler Indications:     I48.91 Atrial Fibrillation  History:         Patient has prior history of Echocardiogram examinations, most                  recent 08/30/2022.  CHF, CAD and Previous Myocardial Infarction,                  COPD, Arrythmias:Atrial Fibrillation, Signs/Symptoms:Shortness                  of Breath; Risk Factors:Hypertension.  Sonographer:     Daphine Deutscher RDCS Referring Phys:  [id number] Francee Nodal FURTH Diagnosing Phys: Julien Nordmann MD IMPRESSIONS  1. Left ventricular ejection fraction, by estimation, is 35 to 40%. The left ventricle has moderately decreased function. The left ventricle demonstrates global hypokinesis. Left ventricular diastolic parameters are indeterminate.  2. Right ventricular systolic function is normal. The right ventricular size is mildly enlarged. There is moderately elevated pulmonary artery systolic pressure. The estimated right ventricular systolic pressure is 45.4 mmHg.  3. Left atrial size was severely dilated.  4. Right atrial size was mildly dilated.  5. The mitral valve is normal in structure. No evidence  of mitral valve regurgitation. No evidence of mitral stenosis.  6. Tricuspid valve regurgitation is moderate.  7. The aortic valve is normal in structure. There is mild calcification of the aortic valve. Aortic valve regurgitation is not visualized. Aortic valve sclerosis/calcification is present, without any evidence of aortic stenosis.  8. The inferior vena cava is normal in size with greater than 50% respiratory variability, suggesting right atrial pressure of 3 mmHg. FINDINGS  Left Ventricle: Left ventricular ejection fraction, by estimation, is 35 to 40%. The left ventricle has moderately decreased function. The left ventricle demonstrates global hypokinesis. The left ventricular internal cavity size was normal in size. There is no left ventricular hypertrophy. Left ventricular diastolic parameters are indeterminate. Right Ventricle: The right ventricular size is mildly enlarged. No increase in right ventricular wall thickness. Right ventricular systolic function is normal. There is moderately elevated pulmonary  artery systolic pressure. The tricuspid regurgitant velocity is 3.18 m/s, and with an assumed right atrial pressure of 5 mmHg, the estimated right ventricular systolic pressure is 45.4 mmHg. Left Atrium: Left atrial size was severely dilated. Right Atrium: Right atrial size was mildly dilated. Pericardium: There is no evidence of pericardial effusion. Mitral Valve: The mitral valve is normal in structure. Mild mitral annular calcification. No evidence of mitral valve regurgitation. No evidence of mitral valve stenosis. Tricuspid Valve: The tricuspid valve is normal in structure. Tricuspid valve regurgitation is moderate . No evidence of tricuspid stenosis. Aortic Valve: The aortic valve is normal in structure. There is mild calcification of the aortic valve. Aortic valve regurgitation is not visualized. Aortic valve sclerosis/calcification is present, without any evidence of aortic stenosis. Aortic valve mean gradient measures 3.5 mmHg. Aortic valve peak gradient measures 7.0 mmHg. Aortic valve area, by VTI measures 1.53 cm. Pulmonic Valve: The pulmonic valve was normal in structure. Pulmonic valve regurgitation is not visualized. No evidence of pulmonic stenosis. Aorta: The aortic root is normal in size and structure. Venous: The inferior vena cava is normal in size with greater than 50% respiratory variability, suggesting right atrial pressure of 3 mmHg. IAS/Shunts: No atrial level shunt detected by color flow Doppler.  LEFT VENTRICLE PLAX 2D LVIDd:         5.00 cm LVIDs:         3.90 cm LV PW:         1.00 cm LV IVS:        0.90 cm LVOT diam:     2.00 cm LV SV:         35 LV SV Index:   16 LVOT Area:     3.14 cm  RIGHT VENTRICLE            IVC RV Basal diam:  4.20 cm    IVC diam: 2.20 cm RV S prime:     7.34 cm/s TAPSE (M-mode): 2.4 cm LEFT ATRIUM              Index        RIGHT ATRIUM           Index LA diam:        5.70 cm  2.61 cm/m   RA Area:     27.60 cm LA Vol (A2C):   108.0 ml 49.50 ml/m  RA Volume:    91.20 ml  41.80 ml/m LA Vol (A4C):   71.5 ml  32.77 ml/m LA Biplane Vol: 89.7 ml  41.11 ml/m  AORTIC VALVE AV Area (Vmax):    1.55 cm AV  Area (Vmean):   1.42 cm AV Area (VTI):     1.53 cm AV Vmax:           132.00 cm/s AV Vmean:          89.150 cm/s AV VTI:            0.232 m AV Peak Grad:      7.0 mmHg AV Mean Grad:      3.5 mmHg LVOT Vmax:         65.20 cm/s LVOT Vmean:        40.350 cm/s LVOT VTI:          0.113 m LVOT/AV VTI ratio: 0.49  AORTA Ao Root diam: 3.20 cm MV E velocity: 94.17 cm/s  TRICUSPID VALVE                            TR Peak grad:   40.4 mmHg                            TR Vmax:        318.00 cm/s                             SHUNTS                            Systemic VTI:  0.11 m                            Systemic Diam: 2.00 cm Julien Nordmann MD Electronically signed by Julien Nordmann MD Signature Date/Time: 03/02/2023/1:29:33 PM    Final     Assessment and Plan  ohnnie L Stmartin is a 67 y.o. male with medical history significant of paroxysmal atrial fibrillation not on anticoagulation, chronic systolic congestive heart failure with ejection fraction 35 to 40%, moderate pulm hypertension, COPD, chronic hypoxemia on 4 L oxygen, coronary disease, essential hypertension, who present to the hospital with complaints of worsening wounds in the right leg.    Severe back pain T11, L3 and L5 compression fractures.  Pain control.   --Physical therapy recommending rehab-- patient has been refusing rehab. Will arrange home health PT. --s/p  3 level kyphoplasty on 7/23 --pt now agreeable for rehab after discussing with him at length. Patient has chosen [facility]. No other bed offers he is aware.   Chronic respiratory failure with hypoxia (HCC) --Baseline 2 L nasal cannula.     COPD exacerbation (HCC) --Chronic respiratory failure on oxygen 2 L.  Completed prednisone.   --Continue inhalers and nebulizers.    Acute pain of left knee --Could be gout even though uric acid not high.  Received Solu-Medrol x2   Sepsis (HCC) Present on admission with right lower extremity cellulitis.  Patient had tachycardia tachypnea and leukocytosis and mildly elevated lactic acid.  Completed doxycycline.   Venous ulcer (HCC) --Will need follow-up at the wound care center.   PAD (peripheral artery disease) (HCC) ---aspirin and Plavix on 7/24. -- Patient will need to follow-up vascular surgery as outpatient.   Paroxysmal atrial fibrillation with RVR (HCC) -- continue metoprolol,digoxin -- per cardiology notes from today patient is now in agreement with eliquis. Patient is now on aspirin + eliquis  Chronic systolic CHF (congestive heart failure) (HCC) --Last EF  35% patient on Toprol, Demadex, Farxiga    Obesity (BMI 30-39.9) --BMI 31.85   Multiple lung nodules --Recommend repeating a CT scan in a few months to ensure clearing. Defer to PCP  Patient is at high risk for readmission given his multiple comorbidities.  Patient now agreeable for rehab. TOC for discharge planning. Await insurance authorization.  Procedures:successful cement augmentation of the T11, L3 and L5 vertebral bodies on 7/23 by IR   Family communication : wife [name] on the phone 7/25 Consults : IR, [facility] cardiology CODE STATUS: full DVT Prophylaxis : eliquis Level of care: Med-Surg Status is: Inpatient Remains inpatient appropriate because: pt is medically Yochim at baseline. Awaiting d/c planning per TOC to rehab    TOTAL TIME TAKING CARE OF THIS PATIENT: 35 minutes.  >50% time spent on counselling and coordination of care  Note: This dictation was prepared with Dragon dictation along with smaller phrase technology. Any transcriptional errors that result from this process are unintentional.  Enedina Finner M.D    [facility]   CC: Primary care physician; Pcp, No

## 2023-03-03 NOTE — TOC Progression Note (Addendum)
Transition of Care [facility]) - Progression Note    Patient Details  Name: Erik [name] MRN: [id number] Date of Birth: 1956-04-11  Transition of Care [facility]) CM/SW Contact  Truddie Hidden, RN Phone Number: 03/03/2023, 3:48 PM  Clinical Narrative:    Per [name] at [facility] [employer] patient is approved for 7 days Auth#[id number] Ambulance [phone]. MD notified.   Attempt to reach [place] at [facility]. No answer. Left a message requesting confirmation for potential discharge tomorrow.     Expected Discharge Plan: Home w Home Health Services Barriers to Discharge: No Barriers Identified  Expected Discharge Plan and Services   Discharge Planning Services: CM Consult   Living arrangements for the past 2 months: Single Family Home                 DME Arranged: Walker rolling with seat DME Agency: [employer] Date DME Agency Contacted: 02/17/23 Time DME Agency Contacted: [phone] Representative spoke with at DME Agency: [facility] HH Arranged: RN, PT HH Agency: [facility] Date [facility] Agency Contacted: 02/20/23 Time HH Agency Contacted: 1005 Representative spoke with at [facility] Agency: Coralee [facility]   Social Determinants of Health (SDOH) Interventions SDOH Screenings   Food Insecurity: No Food Insecurity (02/16/2023)  Housing: Low Risk  (02/16/2023)  Transportation Needs: No Transportation Needs (02/16/2023)  Utilities: Not At Risk (02/16/2023)  Alcohol Screen: Low Risk  (02/19/2020)  Depression (PHQ2-9): Low Risk  (08/25/2020)  Financial Resource Strain: Low Risk  (11/04/2020)  Tobacco Use: Medium Risk (02/28/2023)    Readmission Risk Interventions     No data to display

## 2023-03-03 NOTE — Progress Notes (Signed)
Physical Therapy Treatment Patient Details Name: Erik [name] MRN: [id number] DOB: [birth year] Today's Date: 03/03/2023   History of Present Illness Pt is a 67 y.o. male past medical history significant for peripheral arterial disease, CAD, CHF, atrial fibrillation, COPD. Admitted due to SOB, cellulitis of LE, and a venous ulcer.  Pt with T11 and L3 compression facture now s/p kyphoplasthy 7/23.    PT Comments  Patient seen for second session today in attempts to progress functional mobility as patient ultimately would prefer to return home instead of going to rehab. However, he continues to be limited by dyspnea, fatigue with activity, and back pain.  He required increased assistance for bed mobility this session and is unable to ambulate at this time with limited standing tolerance.  PT will continue to follow in attempts to maximize independence and decrease caregiver burden.     Assistance Recommended at Discharge Frequent or constant Supervision/Assistance  If plan is discharge home, recommend the following:  Can travel by private vehicle        No  Equipment Recommendations       Recommendations for Other Services       Precautions / Restrictions Precautions Precautions: Fall Precaution Comments: back pain Required Braces or Orthoses: Spinal Brace Spinal Brace: Thoracolumbosacral orthotic;Applied in sitting position Restrictions Weight Bearing Restrictions: No     Mobility  Bed Mobility Overal bed mobility: Needs Assistance Bed Mobility: Sit to Supine     Supine to sit: Mod assist, HOB elevated Sit to supine: Mod assist, +2 for physical assistance   General bed mobility comments: verbal cues for technique. +2 person assistance required. patient is more anxious than earlier session. he reports increase in back pain also compared to this morning. cues for logroll technique    Transfers Overall transfer level: Needs assistance Equipment used: Rolling walker (2  wheels) Transfers: Bed to chair/wheelchair/BSC Sit to Stand: Min assist, From elevated surface   Step pivot transfers: Mod assist       General transfer comment: lifting assistance required for standing from recliner chair. verbal cues for technique and hand placement. significant extra time required to complete all tasks secondary to fatigue with activity. mild dyspnea also with mobility noted with cues for slow breathing techniques.    Ambulation/Gait Ambulation/Gait assistance: Min assist Gait Distance (Feet): 5 Feet Assistive device: Rolling walker (2 wheels) Gait Pattern/deviations: Step-to pattern Gait velocity: decreased     General Gait Details: unable to at this time   Stairs             Wheelchair Mobility     Tilt Bed    Modified Rankin (Stroke Patients Only)       Balance   Sitting-balance support: Feet supported Sitting balance-Leahy Scale: Fair     Standing balance support: Bilateral upper extremity supported, During functional activity, Reliant on assistive device for balance Standing balance-Leahy Scale: Fair Standing balance comment: poor standing tolerance and heavy use of rolling walker in standing                            Cognition Arousal/Alertness: Awake/alert Behavior During Therapy: Anxious Overall Cognitive Status: Within Functional Limits for tasks assessed                                          Exercises  General Comments General comments (skin integrity, edema, etc.): total assistance required for doffing TLSO prior to return to bed      Pertinent Vitals/Pain Pain Assessment Pain Assessment: Faces Faces Pain Scale: Hurts even more Pain Location: back Pain Descriptors / Indicators: Discomfort Pain Intervention(s): Limited activity within patient's tolerance, Monitored during session, Repositioned    Home Living                          Prior Function            PT  Goals (current goals can now be found in the care plan section) Acute Rehab PT Goals Patient Stated Goal: To get better PT Goal Formulation: With patient Time For Goal Achievement: 03/14/23 Potential to Achieve Goals: Fair Progress towards PT goals: Progressing toward goals    Frequency    Min 1X/week      PT Plan Current plan remains appropriate    Co-evaluation              AM-PAC PT "6 Clicks" Mobility   Outcome Measure  Help needed turning from your back to your side while in a flat bed without using bedrails?: A Little Help needed moving from lying on your back to sitting on the side of a flat bed without using bedrails?: A Lot Help needed moving to and from a bed to a chair (including a wheelchair)?: A Lot Help needed standing up from a chair using your arms (e.g., wheelchair or bedside chair)?: A Little Help needed to walk in hospital room?: A Lot Help needed climbing 3-5 steps with a railing? : Total 6 Click Score: 13    End of Session Equipment Utilized During Treatment: Gait belt;Back brace Activity Tolerance: Patient limited by fatigue Patient left: in bed;with call bell/phone within reach;with bed alarm set Nurse Communication: Mobility status PT Visit Diagnosis: Muscle weakness (generalized) (M62.81);Pain;Difficulty in walking, not elsewhere classified (R26.2);Unsteadiness on feet (R26.81)     Time: 1610-9604 PT Time Calculation (min) (ACUTE ONLY): 19 min  Charges:    $Therapeutic Activity: 8-22 mins PT General Charges $$ ACUTE PT VISIT: 1 Visit                     Donna Bernard, PT, MPT    Ina Homes 03/03/2023, 3:02 PM

## 2023-03-03 NOTE — Progress Notes (Signed)
Physical Therapy Treatment Patient Details Name: Erik [name] MRN: [id number] DOB: 03-Oct-1955 Today's Date: 03/03/2023   History of Present Illness Pt is a 67 y.o. male past medical history significant for peripheral arterial disease, CAD, CHF, atrial fibrillation, COPD. Admitted due to SOB, cellulitis of LE, and a venous ulcer.  Pt with T11 and L3 compression facture now s/p kyphoplasthy 7/23.    PT Comments  Patient is agreeable to PT. Anxiety seems improved today compared to previous sessions. Patient reports back pain as minimal with activity tolerance most limited by dyspnea with exertion and fatigue with standing activity. The patient continues to require assistance for bed mobility, transfers, and short distance ambulation. Continue to anticipate the need for frequent assistance required with ongoing PT recommended after this hospital stay.     Assistance Recommended at Discharge Frequent or constant Supervision/Assistance  If plan is discharge home, recommend the following:  Can travel by private vehicle    A lot of help with walking and/or transfers;A lot of help with bathing/dressing/bathroom;Help with stairs or ramp for entrance;Assist for transportation;Assistance with cooking/housework   No  Equipment Recommendations   (to be determined at next level of care)    Recommendations for Other Services       Precautions / Restrictions Precautions Precautions: Fall Precaution Comments: back pain Required Braces or Orthoses: Spinal Brace Spinal Brace: Thoracolumbosacral orthotic;Applied in sitting position Restrictions Weight Bearing Restrictions: No     Mobility  Bed Mobility Overal bed mobility: Needs Assistance Bed Mobility: Supine to Sit     Supine to sit: Mod assist, HOB elevated     General bed mobility comments: education for logroll technique. assistance for LE and trunk support. verbal cues for technique    Transfers Overall transfer level: Needs  assistance Equipment used: Rolling walker (2 wheels) Transfers: Sit to/from Stand Sit to Stand: Min assist, From elevated surface           General transfer comment: TLSO donned in sitting prior to standing. verbal cues for technique.    Ambulation/Gait Ambulation/Gait assistance: Min assist Gait Distance (Feet): 5 Feet Assistive device: Rolling walker (2 wheels) Gait Pattern/deviations: Step-to pattern Gait velocity: decreased     General Gait Details: patient is able to take 6-7 steps from bed to chair with steadying assistance provided. increased time and effort required. further ambulation distance is limited by dyspnea with exertion and fatigue with activity. back pain is reported as minimal   Optometrist     Tilt Bed    Modified Rankin (Stroke Patients Only)       Balance   Sitting-balance support: Feet supported Sitting balance-Leahy Scale: Fair     Standing balance support: Bilateral upper extremity supported, During functional activity, Reliant on assistive device for balance Standing balance-Leahy Scale: Fair Standing balance comment: poor standing tolerance and heavy use of rolling walker in standing                            Cognition Arousal/Alertness: Awake/alert Behavior During Therapy: Anxious Overall Cognitive Status: Within Functional Limits for tasks assessed                                          Exercises      General Comments  Pertinent Vitals/Pain Pain Assessment Pain Assessment: Faces Faces Pain Scale: Hurts a little bit Pain Location: back Pain Descriptors / Indicators: Discomfort Pain Intervention(s): Limited activity within patient's tolerance, Monitored during session, Repositioned    Home Living                          Prior Function            PT Goals (current goals can now be found in the care plan section) Acute Rehab PT  Goals Patient Stated Goal: To get better PT Goal Formulation: With patient Time For Goal Achievement: 03/14/23 Potential to Achieve Goals: Fair Progress towards PT goals: Progressing toward goals    Frequency    Min 1X/week      PT Plan Current plan remains appropriate    Co-evaluation              AM-PAC PT "6 Clicks" Mobility   Outcome Measure  Help needed turning from your back to your side while in a flat bed without using bedrails?: A Little Help needed moving from lying on your back to sitting on the side of a flat bed without using bedrails?: A Lot Help needed moving to and from a bed to a chair (including a wheelchair)?: A Lot Help needed standing up from a chair using your arms (e.g., wheelchair or bedside chair)?: A Little Help needed to walk in hospital room?: A Lot Help needed climbing 3-5 steps with a railing? : Total 6 Click Score: 13    End of Session Equipment Utilized During Treatment: Oxygen;Back brace Activity Tolerance: Patient limited by fatigue Patient left: in chair;with call bell/phone within reach;with chair alarm set Nurse Communication: Mobility status PT Visit Diagnosis: Muscle weakness (generalized) (M62.81);Pain;Difficulty in walking, not elsewhere classified (R26.2);Unsteadiness on feet (R26.81)     Time: 4270-6237 PT Time Calculation (min) (ACUTE ONLY): 34 min  Charges:    $Therapeutic Activity: 23-37 mins PT General Charges $$ ACUTE PT VISIT: 1 Visit                     Donna Bernard, PT, MPT    Ina Homes 03/03/2023, 11:11 AM

## 2023-03-03 NOTE — Plan of Care (Signed)
  Problem: Clinical Measurements: Goal: Signs and symptoms of infection will decrease Outcome: Progressing   Problem: Respiratory: Goal: Ability to maintain adequate ventilation will improve Outcome: Progressing   Problem: Clinical Measurements: Goal: Respiratory complications will improve Outcome: Progressing   Problem: Activity: Goal: Risk for activity intolerance will decrease Outcome: Progressing   Problem: Pain Managment: Goal: General experience of comfort will improve Outcome: Progressing   Problem: Safety: Goal: Ability to remain free from injury will improve Outcome: Progressing

## 2023-03-04 DIAGNOSIS — S32000A Wedge compression fracture of unspecified lumbar vertebra, initial encounter for closed fracture: Secondary | ICD-10-CM | POA: Diagnosis not present

## 2023-03-04 DIAGNOSIS — J9611 Chronic respiratory failure with hypoxia: Secondary | ICD-10-CM | POA: Diagnosis not present

## 2023-03-04 DIAGNOSIS — D649 Anemia, unspecified: Secondary | ICD-10-CM | POA: Diagnosis not present

## 2023-03-04 DIAGNOSIS — I83018 Varicose veins of right lower extremity with ulcer other part of lower leg: Secondary | ICD-10-CM | POA: Diagnosis not present

## 2023-03-04 DIAGNOSIS — Z9889 Other specified postprocedural states: Secondary | ICD-10-CM | POA: Diagnosis not present

## 2023-03-04 DIAGNOSIS — I255 Ischemic cardiomyopathy: Secondary | ICD-10-CM | POA: Diagnosis present

## 2023-03-04 DIAGNOSIS — I25118 Atherosclerotic heart disease of native coronary artery with other forms of angina pectoris: Secondary | ICD-10-CM | POA: Diagnosis not present

## 2023-03-04 DIAGNOSIS — F419 Anxiety disorder, unspecified: Secondary | ICD-10-CM | POA: Diagnosis not present

## 2023-03-04 DIAGNOSIS — L03115 Cellulitis of right lower limb: Secondary | ICD-10-CM | POA: Diagnosis not present

## 2023-03-04 DIAGNOSIS — I10 Essential (primary) hypertension: Secondary | ICD-10-CM | POA: Diagnosis not present

## 2023-03-04 DIAGNOSIS — Z4789 Encounter for other orthopedic aftercare: Secondary | ICD-10-CM | POA: Diagnosis not present

## 2023-03-04 DIAGNOSIS — Z515 Encounter for palliative care: Secondary | ICD-10-CM | POA: Diagnosis not present

## 2023-03-04 DIAGNOSIS — Z7401 Bed confinement status: Secondary | ICD-10-CM | POA: Diagnosis not present

## 2023-03-04 DIAGNOSIS — S22030D Wedge compression fracture of third thoracic vertebra, subsequent encounter for fracture with routine healing: Secondary | ICD-10-CM | POA: Diagnosis not present

## 2023-03-04 DIAGNOSIS — I739 Peripheral vascular disease, unspecified: Secondary | ICD-10-CM | POA: Diagnosis not present

## 2023-03-04 DIAGNOSIS — M7989 Other specified soft tissue disorders: Secondary | ICD-10-CM | POA: Diagnosis not present

## 2023-03-04 DIAGNOSIS — I480 Paroxysmal atrial fibrillation: Secondary | ICD-10-CM | POA: Diagnosis not present

## 2023-03-04 DIAGNOSIS — S32030D Wedge compression fracture of third lumbar vertebra, subsequent encounter for fracture with routine healing: Secondary | ICD-10-CM | POA: Diagnosis not present

## 2023-03-04 DIAGNOSIS — E785 Hyperlipidemia, unspecified: Secondary | ICD-10-CM | POA: Diagnosis not present

## 2023-03-04 DIAGNOSIS — S32050S Wedge compression fracture of fifth lumbar vertebra, sequela: Secondary | ICD-10-CM | POA: Diagnosis present

## 2023-03-04 DIAGNOSIS — F413 Other mixed anxiety disorders: Secondary | ICD-10-CM | POA: Diagnosis not present

## 2023-03-04 DIAGNOSIS — S32050D Wedge compression fracture of fifth lumbar vertebra, subsequent encounter for fracture with routine healing: Secondary | ICD-10-CM | POA: Diagnosis not present

## 2023-03-04 DIAGNOSIS — R278 Other lack of coordination: Secondary | ICD-10-CM | POA: Diagnosis not present

## 2023-03-04 DIAGNOSIS — Z539 Procedure and treatment not carried out, unspecified reason: Secondary | ICD-10-CM | POA: Diagnosis not present

## 2023-03-04 DIAGNOSIS — J441 Chronic obstructive pulmonary disease with (acute) exacerbation: Secondary | ICD-10-CM | POA: Diagnosis not present

## 2023-03-04 DIAGNOSIS — R300 Dysuria: Secondary | ICD-10-CM | POA: Diagnosis not present

## 2023-03-04 DIAGNOSIS — M7981 Nontraumatic hematoma of soft tissue: Secondary | ICD-10-CM | POA: Diagnosis not present

## 2023-03-04 DIAGNOSIS — R0989 Other specified symptoms and signs involving the circulatory and respiratory systems: Secondary | ICD-10-CM | POA: Diagnosis not present

## 2023-03-04 DIAGNOSIS — R049 Hemorrhage from respiratory passages, unspecified: Secondary | ICD-10-CM | POA: Diagnosis not present

## 2023-03-04 DIAGNOSIS — M6281 Muscle weakness (generalized): Secondary | ICD-10-CM | POA: Diagnosis not present

## 2023-03-04 DIAGNOSIS — R0902 Hypoxemia: Secondary | ICD-10-CM | POA: Diagnosis not present

## 2023-03-04 DIAGNOSIS — I5042 Chronic combined systolic (congestive) and diastolic (congestive) heart failure: Secondary | ICD-10-CM | POA: Diagnosis not present

## 2023-03-04 DIAGNOSIS — S32030S Wedge compression fracture of third lumbar vertebra, sequela: Secondary | ICD-10-CM | POA: Diagnosis present

## 2023-03-04 DIAGNOSIS — J449 Chronic obstructive pulmonary disease, unspecified: Secondary | ICD-10-CM | POA: Diagnosis not present

## 2023-03-04 DIAGNOSIS — M549 Dorsalgia, unspecified: Secondary | ICD-10-CM | POA: Diagnosis not present

## 2023-03-04 DIAGNOSIS — R042 Hemoptysis: Secondary | ICD-10-CM | POA: Diagnosis not present

## 2023-03-04 DIAGNOSIS — I87311 Chronic venous hypertension (idiopathic) with ulcer of right lower extremity: Secondary | ICD-10-CM | POA: Diagnosis not present

## 2023-03-04 DIAGNOSIS — I4821 Permanent atrial fibrillation: Secondary | ICD-10-CM | POA: Diagnosis not present

## 2023-03-04 DIAGNOSIS — N390 Urinary tract infection, site not specified: Secondary | ICD-10-CM | POA: Diagnosis not present

## 2023-03-04 DIAGNOSIS — M4856XA Collapsed vertebra, not elsewhere classified, lumbar region, initial encounter for fracture: Secondary | ICD-10-CM | POA: Diagnosis not present

## 2023-03-04 DIAGNOSIS — S22080D Wedge compression fracture of T11-T12 vertebra, subsequent encounter for fracture with routine healing: Secondary | ICD-10-CM | POA: Diagnosis not present

## 2023-03-04 DIAGNOSIS — M5459 Other low back pain: Secondary | ICD-10-CM | POA: Diagnosis not present

## 2023-03-04 DIAGNOSIS — L988 Other specified disorders of the skin and subcutaneous tissue: Secondary | ICD-10-CM | POA: Diagnosis not present

## 2023-03-04 DIAGNOSIS — I5022 Chronic systolic (congestive) heart failure: Secondary | ICD-10-CM | POA: Diagnosis not present

## 2023-03-04 MED ORDER — ATORVASTATIN CALCIUM 80 MG PO TABS: 80.0000 mg | ORAL_TABLET | Freq: Every day | ORAL | 1 refills | Status: DC

## 2023-03-04 MED ORDER — GUAIFENESIN-DM 100-10 MG/5ML PO SYRP: 5.0000 mL | ORAL_SOLUTION | ORAL | 0 refills | Status: DC | PRN

## 2023-03-04 MED ORDER — METOPROLOL SUCCINATE ER 100 MG PO TB24: 100.0000 mg | ORAL_TABLET | Freq: Every day | ORAL | 1 refills | Status: DC

## 2023-03-04 MED ORDER — LOSARTAN POTASSIUM 25 MG PO TABS: 12.5000 mg | ORAL_TABLET | Freq: Every day | ORAL | 1 refills | Status: DC

## 2023-03-04 MED ORDER — PANTOPRAZOLE SODIUM 40 MG PO TBEC: 40.0000 mg | DELAYED_RELEASE_TABLET | Freq: Every day | ORAL | 1 refills | Status: DC

## 2023-03-04 MED ORDER — NITROGLYCERIN 0.4 MG SL SUBL: 0.4000 mg | SUBLINGUAL_TABLET | SUBLINGUAL | 12 refills | Status: DC | PRN

## 2023-03-04 MED ORDER — TAMSULOSIN HCL 0.4 MG PO CAPS: 0.4000 mg | ORAL_CAPSULE | Freq: Every day | ORAL | 1 refills | Status: DC

## 2023-03-04 MED ORDER — TRAMADOL HCL 50 MG PO TABS: 100.0000 mg | ORAL_TABLET | Freq: Four times a day (QID) | ORAL | 0 refills | Status: DC | PRN

## 2023-03-04 MED ORDER — TORSEMIDE 40 MG PO TABS: 40.0000 mg | ORAL_TABLET | Freq: Every day | ORAL | 0 refills | Status: DC

## 2023-03-04 MED ORDER — APIXABAN 5 MG PO TABS: 5.0000 mg | ORAL_TABLET | Freq: Two times a day (BID) | ORAL | 1 refills | Status: DC

## 2023-03-04 MED ORDER — SENNOSIDES-DOCUSATE SODIUM 8.6-50 MG PO TABS: 2.0000 | ORAL_TABLET | Freq: Two times a day (BID) | ORAL | 0 refills | Status: DC

## 2023-03-04 MED ORDER — DAPAGLIFLOZIN PROPANEDIOL 10 MG PO TABS: 10.0000 mg | ORAL_TABLET | Freq: Every day | ORAL | 1 refills | Status: DC

## 2023-03-04 NOTE — Progress Notes (Signed)
Attempted to give report to [facility] at [phone], room 808. The nurse was suppose to call me back in 30 minutes after my initial call, however, it is now over 1.5 hours. I have unsuccessfully attempted to reach the nurse twice with no answer when the call got transferred by the person that answered the phone.

## 2023-03-04 NOTE — TOC Transition Note (Signed)
Transition of Care [facility]) - CM/SW Discharge Note   Patient Details  Name: Erik [name] MRN: [id number] Date of Birth: 1956/01/03  Transition of Care [facility]) CM/SW Contact:  Bing Quarry, RN Phone Number: 03/04/2023, 9:48 AM   Clinical Narrative:  03/04/23: Patient has discharge order for today and prior arrangements, bed offer from [facility], discussions with patients per prior provider and TOC CM notes, and insurance authorization completed. Alvino Chapel [phone], at [facility] confirmed and gave room number of [phone] and to call report at [phone]. Discharge Summary, DC orders, and SNF transfer report inboxed to [facility] place this am. EMS paperwork printed to unit printer ASURPA. Upated Unit RN via secure chat. Will call [facility] for transport when patient it ready per Unit RN.   Gabriel Cirri MSN RN CM  Transitions of Care Department [facility] [phone] Weekends Only     Final next level of care: Skilled Nursing Facility Barriers to Discharge: Barriers Resolved   Patient Goals and CMS Choice CMS Medicare.gov Compare Post Acute Care list provided to:: Patient Choice offered to / list presented to : Patient (Per prior CM notes.)  Discharge Placement                Patient [facility] bed at: [facility] Patient to be transferred to facility by: [facility]   Patient and family notified of of transfer: 03/03/23 (Per provider and prior [facility] on 03/03/23.)  Discharge Plan and Services Additional resources added to the After Visit Summary for   In-house Referral: [facility] (Discharging to [facility] 03/04/23. Has been consulted the [facility] IP Liaison this admmission.) Discharge Planning Services: CM Consult            DME Arranged: Walker rolling with seat DME Agency: [employer] Date DME Agency Contacted: 02/17/23 Time DME Agency Contacted: [phone] Representative spoke with at DME Agency: [facility] HH Arranged: RN, PT [facility] Agency: [facility] Date [facility] Agency Contacted: 02/20/23 Time HH Agency Contacted: [phone] Representative spoke with at [facility] Agency: [facility]  Social Determinants of Health (SDOH) Interventions SDOH Screenings   Food Insecurity: No Food Insecurity (02/16/2023)  Housing: Low Risk  (02/16/2023)  Transportation Needs: No Transportation Needs (02/16/2023)  Utilities: Not At Risk (02/16/2023)  Alcohol Screen: Low Risk  (02/19/2020)  Depression (PHQ2-9): Low Risk  (08/25/2020)  Financial Resource Strain: Low Risk  (11/04/2020)  Tobacco Use: Medium Risk (02/28/2023)     Readmission Risk Interventions     No data to display

## 2023-03-04 NOTE — Progress Notes (Signed)
Rounding Note    Patient Name: Erik [name] Date of Encounter: 03/04/2023  [facility] Cardiologist: Lorine Bears, MD   Subjective   Reports breathing is somewhat improved today when compared to yesterday. No chest pain or palpitations.  Documented UOP 410 mL for the past 24 hours, net - 17.7 L.   Inpatient Medications    Scheduled Meds:  apixaban  5 mg Oral BID   aspirin EC  81 mg Oral Daily   atorvastatin  80 mg Oral Daily   benzonatate  100 mg Oral TID   dapagliflozin propanediol  10 mg Oral Daily   fluticasone furoate-vilanterol  1 puff Inhalation Daily   And   umeclidinium bromide  1 puff Inhalation Daily   gabapentin  100 mg Oral TID   ipratropium-albuterol  3 mL Nebulization TID   lidocaine  2 patch Transdermal Q24H   losartan  12.5 mg Oral Daily   metoprolol succinate  100 mg Oral Daily   pantoprazole  40 mg Oral Daily   polyethylene glycol  17 g Oral BID   senna-docusate  2 tablet Oral BID   tamsulosin  0.4 mg Oral QPC supper   torsemide  40 mg Oral Daily   Continuous Infusions:  PRN Meds: acetaminophen, albuterol, alum & mag hydroxide-simeth, alum & mag hydroxide-simeth, guaiFENesin-dextromethorphan, hydrOXYzine, nitroGLYCERIN, ondansetron (ZOFRAN) IV, traMADol   Vital Signs    Vitals:   03/03/23 1954 03/04/23 0019 03/04/23 0437 03/04/23 0721  BP: 92/66 121/75 114/63   Pulse: 83 77 69   Resp: 16 20 18    Temp: 97.6 F (36.4 C) 97.8 F (36.6 C) 97.7 F (36.5 C)   TempSrc: Oral Oral Oral   SpO2: 97% 99% 100% 97%  Weight:      Height:        Intake/Output Summary (Last 24 hours) at 03/04/2023 0759 Last data filed at 03/04/2023 0437 Gross per 24 hour  Intake 440 ml  Output 850 ml  Net -410 ml      02/28/2023   11:25 AM 02/16/2023    9:42 AM 09/08/2022    8:50 AM  Last 3 Weights  Weight (lbs) 222 lb 0.1 oz 222 lb 245 lb 6 oz  Weight (kg) 100.7 kg 100.699 kg 111.3 kg      Telemetry    Afib with RVR improving to the 80s bpm  around midnight - Personally Reviewed  ECG    No new tracings - Personally Reviewed  Physical Exam   GEN: No acute distress.   Neck: No JVD Cardiac: Irregularly irregular no murmurs, rubs, or gallops.  Respiratory: Clear to auscultation bilaterally. GI: Soft, nontender, non-distended  MS: No edema; No deformity. Neuro:  Nonfocal  Psych: Normal affect   Labs    High Sensitivity Troponin:   Recent Labs  Lab 02/16/23 0943 02/16/23 1307 02/28/23 2029 02/28/23 2235  TROPONINIHS 9 9 23* 24*     Chemistry Recent Labs  Lab 02/27/23 0547 02/28/23 2029 03/02/23 0749  NA 132* 133* 130*  K 4.2 5.4* 4.7  CL 88* 94* 95*  CO2 38* 31 31  GLUCOSE 108* 160* 88  BUN 27* 23 25*  CREATININE 0.85 0.89 0.85  CALCIUM 8.5* 8.5* 8.3*  MG  --  2.5*  --   GFRNONAA >60 >60 >60  ANIONGAP 6 8 4*    Lipids No results for input(s): "CHOL", "TRIG", "HDL", "LABVLDL", "LDLCALC", "CHOLHDL" in the last 168 hours.  Hematology Recent Labs  Lab 02/27/23 [phone]  02/28/23 0428 03/01/23 0504  WBC 15.9* 20.4* 19.8*  RBC 5.12 5.07 5.11  HGB 14.3 14.6 14.5  HCT 46.3 45.2 47.4  MCV 90.4 89.2 92.8  MCH 27.9 28.8 28.4  MCHC 30.9 32.3 30.6  RDW 13.8 13.9 14.0  PLT 348 352 340   Thyroid  Recent Labs  Lab 03/01/23 0504  TSH 1.070    BNPNo results for input(s): "BNP", "PROBNP" in the last 168 hours.  DDimer  Recent Labs  Lab 02/28/23 2334  DDIMER 2.44*     Radiology     Cardiac Studies   2D echo 03/01/2023: 1. Left ventricular ejection fraction, by estimation, is 35 to 40%. The  left ventricle has moderately decreased function. The left ventricle  demonstrates global hypokinesis. Left ventricular diastolic parameters are  indeterminate.   2. Right ventricular systolic function is normal. The right ventricular  size is mildly enlarged. There is moderately elevated pulmonary artery  systolic pressure. The estimated right ventricular systolic pressure is  45.4 mmHg.   3. Left atrial  size was severely dilated.   4. Right atrial size was mildly dilated.   5. The mitral valve is normal in structure. No evidence of mitral valve  regurgitation. No evidence of mitral stenosis.   6. Tricuspid valve regurgitation is moderate.   7. The aortic valve is normal in structure. There is mild calcification  of the aortic valve. Aortic valve regurgitation is not visualized. Aortic  valve sclerosis/calcification is present, without any evidence of aortic  stenosis.   8. The inferior vena cava is normal in size with greater than 50%  respiratory variability, suggesting right atrial pressure of 3 mmHg.   Patient Profile     67 y.o. male with history of CAD status post BMS in 2009 to OM2 and CTO of the proximal RCA by LHC in 2016, permanent A-fib dating back to 2012, HFrEF, chronic hypoxic respiratory failure on supplemental oxygen at baseline, HLD, COPD, and arthritis who was admitted on 02/16/2023 with worsening wounds on the right leg as well as back pain. He was noted to have multilevel compression fractures and underwent kyphoplasty 02/28/2023 due to refractory pain, and we are seeing for A-fib with RVR during kyphoplasty.   Assessment & Plan    Permanent atrial fibrillation with RVR -Ventricular rates are improved, currently in the 80s bpm, was in RVR throughout the day on 7/26 -Continue metoprolol succinate 100 mg daily  -He reports stopping Eliquis as outpatient secondary to brusing, agreeable to restarting Eliquis at this time, started on 7/26 -CHADS2VASc at least 3 (CHF, age x 1, vascular disease)  -Consider EP evaluation for Watchman given patient concerns regarding bruising on OAC   HFrEF/ICM: -Continues to report underlying dyspnea, appears euvolemic -Underlying dyspnea is likely multifactorial including pulmonary disease, cardiomyopathy, and A-fib -Defer elevated D-dimer to primary service -PTA torsemide 40 mg -Echo with admission with an EF of 35-40%, stable -Metoprolol  succinate 100 mg daily,  losartan 12.5 daily, and Farxiga 10 -Escalate GDMT with addition of spironolactone if potassium remains stable -Relative hypotension precludes transition of ARB to ARNI at this time -CHF education   CAD involving the native coronary arteries with stable angina: -Denies anginal symptoms -Mildly elevated and flat trending high-sensitivity troponin, not consistent with ACS -No indication for heparin drip -No plan for ischemic workup at this time with recommendation to follow-up as outpatient for further discussion of cardiac risk stratification  -Aspirin and atorvastatin -LDL 89 in 08/2022 with goal LDL at least less  than 70, now on statin  PAD: -Status post lower extremity stenting in 08/2022 -Transition from aspirin and Plavix to aspirin and Eliquis for stroke risk reduction given A-fib -Followed by vascular surgery  Hyperkalemia: -Improved     For questions or updates, please contact [facility] Please consult [email or web address] for contact info under        Signed, Eula Listen, PA-C  03/04/2023, 7:59 AM

## 2023-03-04 NOTE — Discharge Instructions (Signed)
Pt advised to find PCP in the area

## 2023-03-04 NOTE — Progress Notes (Signed)
Physical Therapy Treatment Patient Details Name: Erik [name] MRN: [id number] DOB: 1955-08-20 Today's Date: 03/04/2023   History of Present Illness Pt is a 67 y.o. male past medical history significant for peripheral arterial disease, CAD, CHF, atrial fibrillation, COPD. Admitted due to SOB, cellulitis of LE, and a venous ulcer.  Pt with T11 and L3 compression facture now s/p kyphoplasthy 7/23.    PT Comments  Pt performed bed mobility with mod A needing cues for log roll technique and for sequencing.  Pt performed sit<>stand at EOB with mod A.  Pt requires a lot of  time to complete a mobility task demonstrating continued anxiety, dyspnea, fatigue, and reported pain in back (in sitting positon) and chest (at the end of session with 4/4 dyspnea.  Pt's TLSO brace donned in sitting.  Pt on 4 LO2 , during activity SPO2 83 -88% requiring seated rest to go back to >90%.  HR during activity 82-103bpm.  Continue PT will assist pt towards greater activity tolerance, functional strength, and mobility.    Assistance Recommended at Discharge Frequent or constant Supervision/Assistance  If plan is discharge home, recommend the following:  Can travel by private vehicle    A lot of help with walking and/or transfers;A lot of help with bathing/dressing/bathroom;Help with stairs or ramp for entrance;Assist for transportation;Assistance with cooking/housework   No  Equipment Recommendations   (TBD)    Recommendations for Other Services       Precautions / Restrictions Precautions Precautions: Fall Precaution Comments: back pain Required Braces or Orthoses: Spinal Brace Spinal Brace: Thoracolumbosacral orthotic;Applied in sitting position Restrictions Weight Bearing Restrictions: No     Mobility  Bed Mobility Overal bed mobility: Needs Assistance Bed Mobility: Sit to Supine, Sidelying to Sit   Sidelying to sit: Mod assist Supine to sit: Mod assist Sit to supine: Max assist Sit to  sidelying: Mod assist General bed mobility comments: cues for logroll technique;  increased time needed for scooting up  to [name] with cues for hand placement.    Transfers Overall transfer level: Needs assistance Equipment used: Rolling walker (2 wheels) Transfers: Sit to/from Stand Sit to Stand: Mod assist, From elevated surface           General transfer comment: significant extra time required to complete all tasks secondary to fatigue with activity. mild dyspnea also with mobility noted with cues for slow breathing techniques. Pt able to perform marching in place x2. Pt declined a second standing attempt reporting difficulty breathing and wanting to lie back down in bed.    Ambulation/Gait               General Gait Details: pt declined   Stairs             Wheelchair Mobility     Tilt Bed    Modified Rankin (Stroke Patients Only)       Balance Overall balance assessment: Needs assistance Sitting-balance support: Feet supported Sitting balance-Leahy Scale: Fair Sitting balance - Comments: patient unable to maintain sitting balance without UE support. Postural control: Posterior lean Standing balance support: Bilateral upper extremity supported, During functional activity, Reliant on assistive device for balance Standing balance-Leahy Scale: Fair Standing balance comment: poor standing tolerance and heavy use of rolling walker in standing                            Cognition Arousal/Alertness: Awake/alert Behavior During Therapy: Anxious Overall Cognitive Status: Within Functional Limits  for tasks assessed                                          Exercises      General Comments        Pertinent Vitals/Pain Pain Assessment Pain Assessment: Faces Faces Pain Scale: Hurts even more Pain Location: back Pain Descriptors / Indicators: Discomfort Pain Intervention(s): Limited activity within patient's tolerance,  Monitored during session    Home Living                          Prior Function            PT Goals (current goals can now be found in the care plan section) Acute Rehab PT Goals Patient Stated Goal: To get better PT Goal Formulation: With patient Time For Goal Achievement: 03/14/23 Potential to Achieve Goals: Fair Progress towards PT goals: Progressing toward goals    Frequency    Min 1X/week      PT Plan Current plan remains appropriate    Co-evaluation              AM-PAC PT "6 Clicks" Mobility   Outcome Measure  Help needed turning from your back to your side while in a flat bed without using bedrails?: A Lot Help needed moving from lying on your back to sitting on the side of a flat bed without using bedrails?: A Lot Help needed moving to and from a bed to a chair (including a wheelchair)?: A Lot Help needed standing up from a chair using your arms (e.g., wheelchair or bedside chair)?: A Lot Help needed to walk in hospital room?: A Lot Help needed climbing 3-5 steps with a railing? : Total 6 Click Score: 11    End of Session Equipment Utilized During Treatment: Back brace Activity Tolerance: Patient limited by fatigue;Patient limited by pain Patient left: in bed;with call bell/phone within reach;with nursing/sitter in room Nurse Communication: Mobility status PT Visit Diagnosis: Muscle weakness (generalized) (M62.81);Pain;Difficulty in walking, not elsewhere classified (R26.2);Unsteadiness on feet (R26.81) Pain - Right/Left:  (central back)     Time: 1345-1414 PT Time Calculation (min) (ACUTE ONLY): 29 min  Charges:    $Therapeutic Activity: 23-37 mins PT General Charges $$ ACUTE PT VISIT: 1 Visit                     Hortencia Conradi, PTA  03/04/23, 2:43 PM

## 2023-03-04 NOTE — Discharge Summary (Signed)
Physician Discharge Summary   Patient: Erik [name] MRN: [id number] DOB: November 02, 1955  Admit date:     02/16/2023  Discharge date: 03/04/23  Discharge Physician: Enedina Finner   PCP: Pcp, No   Recommendations at discharge:    Pt advised to find PCP in the area F/u Neurosurgery Manning Charity, PA in 1-2 weeks F/u [facility] cardiology in 1-2 weeks for Afib  Discharge Diagnoses: Principal Problem:   Severe back pain Active Problems:   COPD exacerbation (HCC)   Chronic respiratory failure with hypoxia (HCC)   Acute pain of left knee   Hemoptysis   Venous ulcer (HCC)   Sepsis (HCC)   PAD (peripheral artery disease) (HCC)   Paroxysmal atrial fibrillation with RVR (HCC)   Chronic systolic CHF (congestive heart failure) (HCC)   Obesity (BMI 30-39.9)   Cellulitis and abscess of right leg   Moderate pulmonary hypertension (HCC)   Compression fracture of T11 vertebra (HCC)   Multiple lung nodules   Dysphagia   Closed compression fracture of third lumbar vertebra (HCC)   Compression fracture of fifth lumbar vertebra (HCC)  Erik [name] is a 67 y.o. male with medical history significant of paroxysmal atrial fibrillation not on anticoagulation, chronic systolic congestive heart failure with ejection fraction 35 to 40%, moderate pulm hypertension, COPD, chronic hypoxemia on 4 L oxygen, coronary disease, essential hypertension, who present to the hospital with complaints of worsening wounds in the right leg.      Severe back pain T11, L3 and L5 compression fractures.  Pain control.   --Physical therapy recommending rehab-- patient has been refusing rehab but now agreeable --s/p  3 level kyphoplasty on 7/23 --pt now agreeable for rehab after discussing with him at length. Patient has chosen [facility].  --f/u Neurosurgery as out pt   Chronic respiratory failure with hypoxia (HCC) --Baseline 2 L nasal cannula.     COPD exacerbation (HCC) --Chronic respiratory failure on oxygen 2 L.   Completed prednisone.   --Continue inhalers and nebulizers.    Acute pain of left knee --Could be gout even though uric acid not high. Received Solu-Medrol x2   Sepsis (HCC) -Present on admission with right lower extremity cellulitis.  Patient had tachycardia tachypnea and leukocytosis and mildly elevated lactic acid.  Completed doxycycline. --resolved   Venous ulcer (HCC) --Will need follow-up at the wound care center.   PAD (peripheral artery disease) (HCC) ---aspirin and eliquis --plavix d/ced -- Patient will need to follow-up vascular surgery as outpatient.   Paroxysmal atrial fibrillation with RVR (HCC) -- continue metoprolol,digoxin -- per cardiology from 7/26 patient is now in agreement with eliquis. Patient is now on aspirin + eliquis   Chronic systolic CHF (congestive heart failure) (HCC) --Last EF 35% patient on Toprol, Demadex, Farxiga, losartan     Obesity (BMI 30-39.9) --BMI 31.85   Multiple lung nodules --Recommend repeating a CT scan in a few months to ensure clearing. Defer to PCP   Patient is at high risk for readmission given his multiple comorbidities.  Patient now agreeable for rehab D/c to [facility] today   Procedures:successful cement augmentation of the T11, L3 and L5 vertebral bodies on 7/23 by IR   Family communication : wife [name] on the phone 7/25 Consults : IR, [facility] cardiology CODE STATUS: full DVT Prophylaxis : eliquis Level of care: Med-Surg     Pain control - [facility], [place] Controlled Substance Reporting System database was reviewed. and patient was instructed, not to drive, operate heavy machinery,  perform activities at heights, swimming or participation in water activities or provide baby-sitting services while on Pain, Sleep and Anxiety Medications; until their outpatient Physician has advised to do so again. Also recommended to not to take more than prescribed Pain, Sleep and Anxiety Medications.  Disposition: Rehabilitation  facility Diet recommendation:  Discharge Diet Orders (From admission, onward)     Start     Ordered   03/04/23 0000  Diet - low sodium heart healthy        03/04/23 0819           Cardiac and Carb modified diet DISCHARGE MEDICATION: Allergies as of 03/04/2023       Reactions   Benadryl [diphenhydramine Hcl] Shortness Of Breath   Morphine And Codeine Swelling   Difficulty breathing per patient    Oxycodone Swelling   Severe mouth swelling requiring medical intervention        Medication List     STOP taking these medications    clopidogrel 75 MG tablet Commonly known as: PLAVIX   hydrOXYzine 25 MG tablet Commonly known as: ATARAX   leptospermum manuka honey Pste paste   metoprolol tartrate 100 MG tablet Commonly known as: LOPRESSOR   oxymetazoline 0.05 % nasal spray Commonly known as: AFRIN       TAKE these medications    acetaminophen 325 MG tablet Commonly known as: TYLENOL Take 2 tablets (650 mg total) by mouth every 6 (six) hours as needed for mild pain (or Fever >/= 101).   albuterol 108 (90 Base) MCG/ACT inhaler Commonly known as: VENTOLIN HFA Inhale 1 puff into the lungs every 6 (six) hours as needed for wheezing or shortness of breath.   apixaban 5 MG Tabs tablet Commonly known as: ELIQUIS Take 1 tablet (5 mg total) by mouth 2 (two) times daily.   aspirin EC 81 MG tablet Take 1 tablet (81 mg total) by mouth daily. Swallow whole.   atorvastatin 80 MG tablet Commonly known as: LIPITOR Take 1 tablet (80 mg total) by mouth daily.   dapagliflozin propanediol 10 MG Tabs tablet Commonly known as: FARXIGA Take 1 tablet (10 mg total) by mouth daily.   gabapentin 100 MG capsule Commonly known as: NEURONTIN Take 100 mg by mouth 3 (three) times daily.   guaiFENesin-dextromethorphan 100-10 MG/5ML syrup Commonly known as: ROBITUSSIN DM Take 5 mLs by mouth every 4 (four) hours as needed for cough.   ipratropium-albuterol 0.5-2.5 (3) MG/3ML  Soln Commonly known as: DUONEB USE 1 VIAL IN NEBULIZER EVERY 6 HOURS   losartan 25 MG tablet Commonly known as: COZAAR Take 0.5 tablets (12.5 mg total) by mouth daily.   metoprolol succinate 100 MG 24 hr tablet Commonly known as: TOPROL-XL Take 1 tablet (100 mg total) by mouth daily. Take with or immediately following a meal.   nitroGLYCERIN 0.4 MG SL tablet Commonly known as: NITROSTAT Place 1 tablet (0.4 mg total) under the tongue every 5 (five) minutes as needed for chest pain.   pantoprazole 40 MG tablet Commonly known as: PROTONIX Take 1 tablet (40 mg total) by mouth daily.   polyethylene glycol 17 g packet Commonly known as: MIRALAX / GLYCOLAX Take 17 g by mouth daily.   senna-docusate 8.6-50 MG tablet Commonly known as: Senokot-S Take 2 tablets by mouth 2 (two) times daily.   tamsulosin 0.4 MG Caps capsule Commonly known as: FLOMAX Take 1 capsule (0.4 mg total) by mouth daily after supper.   Torsemide 40 MG Tabs Take 40 mg by mouth  daily. What changed:  medication strength See the new instructions.   traMADol 50 MG tablet Commonly known as: ULTRAM Take 2 tablets (100 mg total) by mouth every 6 (six) hours as needed for severe pain. What changed:  how much to take reasons to take this   Trelegy Ellipta 100-62.5-25 MCG/ACT Aepb Generic drug: Fluticasone-Umeclidin-Vilant INHALE 1 PUFF BY MOUTH INTO LUNGS DAILY               Durable Medical Equipment  (From admission, onward)           Start     Ordered   02/17/23 0951  For home use only DME 4 wheeled rolling walker with seat  Once       Question:  Patient needs a walker to treat with the following condition  Answer:  Impaired mobility   02/17/23 0950              Discharge Care Instructions  (From admission, onward)           Start     Ordered   03/04/23 0000  Discharge wound care:       Comments: 02/17/23 0751    Wound care  Daily      Comments: Wound care to right lateral LE  wound: Cleanse wound with Vashe [place] [phone]), pat dry. Apply Vashe solution moistened gauze to wound, top with dry gauze, ABD pad and secure by wrapping with Kerlix roll gauze applied from just below toes to just below knee. Top Kerlix with ACE bandage applied in a similar manner.Change daily.   03/04/23 0454            Follow-up Information     Susanne Borders, PA Follow up on 03/16/2023.   Specialty: Neurosurgery Contact information: [address] [place] [address] [phone]         Antonieta Iba, MD. Schedule an appointment as soon as possible for a visit in 1 week(s).   Specialty: Cardiology Why: afib Contact information: [address] [place] [address] [phone]                Discharge Exam: Ceasar Mons Weights   02/16/23 3244 02/28/23 1125  Weight: 100.7 kg 100.7 kg   GENERAL:  67 y.o.-year-old patient with no acute distress. Morbidly obese LUNGS: distant breath sounds bilaterally, no wheezing CARDIOVASCULAR: S1, S2 normal. No murmur tachycardia ABDOMEN: Soft, nontender, nondistended. Bowel sounds present.  EXTREMITIES: 1+ edema b/l.   Venous ulcers+ NEUROLOGIC: nonfocal  patient is alert and awake   Condition at discharge: fair  The results of significant diagnostics from this hospitalization (including imaging, microbiology, ancillary and laboratory) are listed below for reference.   Imaging Studies: ECHOCARDIOGRAM COMPLETE  Result Date: 03/02/2023    ECHOCARDIOGRAM REPORT   Patient Name:   Erik [name] Date of Exam: 03/01/2023 Medical Rec #:  [id number]      Height:       70.0 in Accession #:    [id number]     Weight:       222.0 lb Date of Birth:  08/30/1955     BSA:          2.182 m Patient Age:    66 years       BP:           152/84 mmHg Patient Gender: M              HR:  51 bpm. Exam Location:  [facility] Procedure: 2D Echo, Cardiac Doppler and Color Doppler Indications:     I48.91  Atrial Fibrillation  History:         Patient has prior history of Echocardiogram examinations, most                  recent 08/30/2022. CHF, CAD and Previous Myocardial Infarction,                  COPD, Arrythmias:Atrial Fibrillation, Signs/Symptoms:Shortness                  of Breath; Risk Factors:Hypertension.  Sonographer:     Daphine Deutscher RDCS Referring Phys:  [id number] Francee Nodal FURTH Diagnosing Phys: Julien Nordmann MD IMPRESSIONS  1. Left ventricular ejection fraction, by estimation, is 35 to 40%. The left ventricle has moderately decreased function. The left ventricle demonstrates global hypokinesis. Left ventricular diastolic parameters are indeterminate.  2. Right ventricular systolic function is normal. The right ventricular size is mildly enlarged. There is moderately elevated pulmonary artery systolic pressure. The estimated right ventricular systolic pressure is 45.4 mmHg.  3. Left atrial size was severely dilated.  4. Right atrial size was mildly dilated.  5. The mitral valve is normal in structure. No evidence of mitral valve regurgitation. No evidence of mitral stenosis.  6. Tricuspid valve regurgitation is moderate.  7. The aortic valve is normal in structure. There is mild calcification of the aortic valve. Aortic valve regurgitation is not visualized. Aortic valve sclerosis/calcification is present, without any evidence of aortic stenosis.  8. The inferior vena cava is normal in size with greater than 50% respiratory variability, suggesting right atrial pressure of 3 mmHg. FINDINGS  Left Ventricle: Left ventricular ejection fraction, by estimation, is 35 to 40%. The left ventricle has moderately decreased function. The left ventricle demonstrates global hypokinesis. The left ventricular internal cavity size was normal in size. There is no left ventricular hypertrophy. Left ventricular diastolic parameters are indeterminate. Right Ventricle: The right ventricular size is mildly enlarged.  No increase in right ventricular wall thickness. Right ventricular systolic function is normal. There is moderately elevated pulmonary artery systolic pressure. The tricuspid regurgitant velocity is 3.18 m/s, and with an assumed right atrial pressure of 5 mmHg, the estimated right ventricular systolic pressure is 45.4 mmHg. Left Atrium: Left atrial size was severely dilated. Right Atrium: Right atrial size was mildly dilated. Pericardium: There is no evidence of pericardial effusion. Mitral Valve: The mitral valve is normal in structure. Mild mitral annular calcification. No evidence of mitral valve regurgitation. No evidence of mitral valve stenosis. Tricuspid Valve: The tricuspid valve is normal in structure. Tricuspid valve regurgitation is moderate . No evidence of tricuspid stenosis. Aortic Valve: The aortic valve is normal in structure. There is mild calcification of the aortic valve. Aortic valve regurgitation is not visualized. Aortic valve sclerosis/calcification is present, without any evidence of aortic stenosis. Aortic valve mean gradient measures 3.5 mmHg. Aortic valve peak gradient measures 7.0 mmHg. Aortic valve area, by VTI measures 1.53 cm. Pulmonic Valve: The pulmonic valve was normal in structure. Pulmonic valve regurgitation is not visualized. No evidence of pulmonic stenosis. Aorta: The aortic root is normal in size and structure. Venous: The inferior vena cava is normal in size with greater than 50% respiratory variability, suggesting right atrial pressure of 3 mmHg. IAS/Shunts: No atrial level shunt detected by color flow Doppler.  LEFT VENTRICLE PLAX 2D LVIDd:  5.00 cm LVIDs:         3.90 cm LV PW:         1.00 cm LV IVS:        0.90 cm LVOT diam:     2.00 cm LV SV:         35 LV SV Index:   16 LVOT Area:     3.14 cm  RIGHT VENTRICLE            IVC RV Basal diam:  4.20 cm    IVC diam: 2.20 cm RV S prime:     7.34 cm/s TAPSE (M-mode): 2.4 cm LEFT ATRIUM              Index        RIGHT  ATRIUM           Index LA diam:        5.70 cm  2.61 cm/m   RA Area:     27.60 cm LA Vol (A2C):   108.0 ml 49.50 ml/m  RA Volume:   91.20 ml  41.80 ml/m LA Vol (A4C):   71.5 ml  32.77 ml/m LA Biplane Vol: 89.7 ml  41.11 ml/m  AORTIC VALVE AV Area (Vmax):    1.55 cm AV Area (Vmean):   1.42 cm AV Area (VTI):     1.53 cm AV Vmax:           132.00 cm/s AV Vmean:          89.150 cm/s AV VTI:            0.232 m AV Peak Grad:      7.0 mmHg AV Mean Grad:      3.5 mmHg LVOT Vmax:         65.20 cm/s LVOT Vmean:        40.350 cm/s LVOT VTI:          0.113 m LVOT/AV VTI ratio: 0.49  AORTA Ao Root diam: 3.20 cm MV E velocity: 94.17 cm/s  TRICUSPID VALVE                            TR Peak grad:   40.4 mmHg                            TR Vmax:        318.00 cm/s                             SHUNTS                            Systemic VTI:  0.11 m                            Systemic Diam: 2.00 cm Julien Nordmann MD Electronically signed by Julien Nordmann MD Signature Date/Time: 03/02/2023/1:29:33 PM    Final    IR KYPHO THORACIC WITH BONE BIOPSY  Result Date: 03/01/2023 CLINICAL DATA:  Symptomatic compression fractures. Please perform image guided kyphoplasty for therapeutic purposes. EXAM: FLUOROSCOPIC GUIDED KYPHOPLASTY OF THE T11, L3 AND L5 VERTEBRAL BODIES COMPARISON:  Thoracic and lumbar spine MRI-02/22/2023 MEDICATIONS: As antibiotic prophylaxis, Ancef 3 gm IV was ordered pre-procedure and administered intravenously within 1 hour of incision. ANESTHESIA/SEDATION: As  per the ICU staff FLUOROSCOPY: 14 minutes, 48 seconds (1,285 mGy) COMPLICATIONS: SIR LEVEL B - Normal therapy, includes overnight admission for observation. Procedure complicated by patient's development of atrial fibrillation with rapid ventricular response, ultimately managed medically. PROCEDURE: The procedure, risks (including but not limited to bleeding, infection, organ damage), benefits, and alternatives were explained to the patient. Questions  regarding the procedure were encouraged and answered. The patient understands and consents to the procedure. The patient has suffered a fracture of the T11, L3 and L5 vertebral bodies. It is recommended that patients aged 22 years or older be evaluated for possible testing or treatment of osteoporosis. A copy of this procedure report is sent to the patient's referring physician The patient was placed prone on the fluoroscopic table. The skin overlying the upper thoracic region was then prepped and draped in the usual sterile fashion. Maximal barrier sterile technique was utilized including caps, mask, sterile gowns, sterile gloves, sterile drape, hand hygiene and skin antiseptic. Intravenous Fentanyl and Versed were administered as conscious sedation during continuous cardiorespiratory monitoring by the radiology RN. The left pedicle at T11 was then infiltrated with 1% lidocaine followed by the advancement of a Kyphon trocar needle through the left pedicle into the posterior one-third of the vertebral body. The trocar was removed and a bone biopsy was obtained at this location. Subsequently, the osteo drill was advanced to the anterior third of the vertebral body. The osteo drill was retracted. Through the working cannula, a Kyphon inflatable bone tamp 15 x 3 was advanced and positioned with the distal marker approximately 5 mm from the anterior aspect of the cortex. Appropriate positioning was confirmed on the AP projection. The identical procedure was repeated at the L3 vertebral body via left transpedicular approach and at the L5 vertebral body via a right transpedicular approach. At this time, the balloons were expanded using contrast via a Kyphon inflation syringe device via micro tubing. Inflations were continued until there was near apposition with the superior end plate. Methylmethacrylate mixture was reconstituted in the Kyphon bone mixing device system. This was then loaded into the delivery mechanism,  attached to Kyphon bone fillers. The balloons were deflated and removed followed by the instillation of methylmethacrylate mixture with excellent filling in the AP and lateral projections. No extravasation was noted in the disk spaces or posteriorly into the spinal canal. No epidural venous contamination was seen. The working cannulae and the bone filler were then retrieved and removed. Hemostasis was achieved with manual compression. The patient was imaged with the hypercarbic and tachycardic necessitating the prompt conclusion of the procedure. The patient otherwise tolerated the procedure well without immediate postprocedural complication. IMPRESSION: 1. Technically successful T11, L3 and L5 vertebral body augmentation using balloon kyphoplasty. 2. Per CMS PQRS reporting requirements (PQRS Measure 24): Given the patient's age of greater than 50 and the fracture site (hip, distal radius, or spine), the patient should be tested for osteoporosis using DXA, and the appropriate treatment considered based on the DXA results. Electronically Signed   By: Simonne Come M.D.   On: 03/01/2023 09:38   DG Chest 2 View  Result Date: 02/23/2023 CLINICAL DATA:  Cough EXAM: CHEST - 2 VIEW COMPARISON:  Previous studies including the examination of 02/16/2023 FINDINGS: Transverse diameter of heart is increased. There are no signs of pulmonary edema or new focal infiltrates. There is no pleural effusion or pneumothorax. Emphysematous changes are noted in the upper lung fields, more so on the right side. IMPRESSION: Cardiomegaly. There are  no signs of pulmonary edema or focal pulmonary consolidation. Electronically Signed   By: Ernie Avena M.D.   On: 02/23/2023 08:53   DG Lumbar Spine 2-3 Views  Result Date: 02/22/2023 CLINICAL DATA:  Upright images for fracture EXAM: LUMBAR SPINE - 2-3 VIEW COMPARISON:  MRI 02/22/2023 and radiographs 02/20/2023 FINDINGS: Demineralization. Similar compression fracture of the T11, L2,  L3, L4, and L5. Similar retropulsion of the superior endplate of L3. IMPRESSION: Acute and chronic compression fractures in the thoracolumbar spine similar to MRI 02/22/2023. Electronically Signed   By: Minerva Fester M.D.   On: 02/22/2023 19:10   MR LUMBAR SPINE WO CONTRAST  Result Date: 02/22/2023 CLINICAL DATA:  Thoracic compression fracture EXAM: MRI THORACIC AND LUMBAR SPINE WITHOUT CONTRAST TECHNIQUE: Multiplanar and multiecho pulse sequences of the thoracic and lumbar spine were obtained without intravenous contrast. COMPARISON:  None Available. FINDINGS: MRI THORACIC SPINE FINDINGS Alignment:  Physiologic. Vertebrae: Marrow edema in the T11 body with minimal superior endplate depression. No retropulsion. No evidence of underlying bone lesion. Cord:  Normal signal and morphology. Paraspinal and other soft tissues: Limited to no paravertebral edema at the level of fracture. Disc levels: No significant degenerative change, only minor spondylitic spurring typical for age. No neural impingement. MRI LUMBAR SPINE FINDINGS Segmentation:  Standard. Alignment:  Physiologic. Vertebrae: Compression fractures of L2-L5. Completed healing with no marrow edema at the L2 and L4 levels where there is moderate height loss. Marrow edema with fluid-filled fracture clefts the L3 and L5 bodies, following the superior endplates. Height loss measures up to 40% at L3 and 60% at L5. Mild retropulsion of the posterosuperior corners with up to moderate thecal sac narrowing at L4-5 due to the retropulsion and ligamentous thickening. Conus medullaris and cauda equina: Conus extends to the L2 level. Conus and cauda equina appear normal. Paraspinal and other soft tissues: Negative for perispinal mass or inflammation. Disc levels: Diffusely preserved disc height and hydration. No significant facet spurring. The foramina are diffusely patent. IMPRESSION: MR THORACIC SPINE IMPRESSION Recent and un-healed T11 compression fracture with  minimal height loss and no retropulsion. MR LUMBAR SPINE IMPRESSION 1. Recent and un-healed compression fractures at L3 and L5 with up to 60% height loss at L5 where there is also posterosuperior corner retropulsion contributing to moderate thecal sac narrowing. 2. Remote and healed L2 and L4 compression fractures. Electronically Signed   By: Tiburcio Pea M.D.   On: 02/22/2023 16:01   MR THORACIC SPINE WO CONTRAST  Result Date: 02/22/2023 CLINICAL DATA:  Thoracic compression fracture EXAM: MRI THORACIC AND LUMBAR SPINE WITHOUT CONTRAST TECHNIQUE: Multiplanar and multiecho pulse sequences of the thoracic and lumbar spine were obtained without intravenous contrast. COMPARISON:  None Available. FINDINGS: MRI THORACIC SPINE FINDINGS Alignment:  Physiologic. Vertebrae: Marrow edema in the T11 body with minimal superior endplate depression. No retropulsion. No evidence of underlying bone lesion. Cord:  Normal signal and morphology. Paraspinal and other soft tissues: Limited to no paravertebral edema at the level of fracture. Disc levels: No significant degenerative change, only minor spondylitic spurring typical for age. No neural impingement. MRI LUMBAR SPINE FINDINGS Segmentation:  Standard. Alignment:  Physiologic. Vertebrae: Compression fractures of L2-L5. Completed healing with no marrow edema at the L2 and L4 levels where there is moderate height loss. Marrow edema with fluid-filled fracture clefts the L3 and L5 bodies, following the superior endplates. Height loss measures up to 40% at L3 and 60% at L5. Mild retropulsion of the posterosuperior corners with up to moderate  thecal sac narrowing at L4-5 due to the retropulsion and ligamentous thickening. Conus medullaris and cauda equina: Conus extends to the L2 level. Conus and cauda equina appear normal. Paraspinal and other soft tissues: Negative for perispinal mass or inflammation. Disc levels: Diffusely preserved disc height and hydration. No significant  facet spurring. The foramina are diffusely patent. IMPRESSION: MR THORACIC SPINE IMPRESSION Recent and un-healed T11 compression fracture with minimal height loss and no retropulsion. MR LUMBAR SPINE IMPRESSION 1. Recent and un-healed compression fractures at L3 and L5 with up to 60% height loss at L5 where there is also posterosuperior corner retropulsion contributing to moderate thecal sac narrowing. 2. Remote and healed L2 and L4 compression fractures. Electronically Signed   By: Tiburcio Pea M.D.   On: 02/22/2023 16:01   DG Lumbar Spine 2-3 Views  Result Date: 02/20/2023 CLINICAL DATA:  67 year old male with back pain, severe pain when lying down. EXAM: LUMBAR SPINE - 2-3 VIEW COMPARISON:  MRI lumbar spine 01/20/2022. CT Abdomen and Pelvis 09/25/2022. CTA chest 02/17/2023. FINDINGS: Osteopenia. Normal lumbar segmentation. Chronic L2, chronic L2, L4, and L5 compression fractures are stable since February, but moderate new L3 compression fracture since that time. Visible lower thoracic levels and L1 appear radiographically stable, although mild new T11 compression fracture demonstrated by CTA chest 3 days ago. Stable cholecystectomy clips. Aortoiliac calcified atherosclerosis. Chronic right Common iliac artery vascular stent. Nonobstructed visible bowel gas pattern with retained stool. IMPRESSION: 1. T11 (mild) and L3 (moderate) compression fractures are new since February. If specific therapy such as vertebroplasty is desired, noncontrast MRI or Nuclear Medicine Whole-body Bone Scan would Schinke determine acuity and candidacy for augmentation. 2. Osteopenia with chronic L2, L4, and L5 compression fractures. 3.  Aortic Atherosclerosis (ICD10-I70.0). Electronically Signed   By: Odessa Fleming M.D.   On: 02/20/2023 12:11   CT Angio Chest Pulmonary Embolism (PE) W or WO Contrast  Result Date: 02/17/2023 CLINICAL DATA:  Pulmonary embolism suspected, high probability, shortness of breath. EXAM: CT ANGIOGRAPHY CHEST  WITH CONTRAST TECHNIQUE: Multidetector CT imaging of the chest was performed using the standard protocol during bolus administration of intravenous contrast. Multiplanar CT image reconstructions and MIPs were obtained to evaluate the vascular anatomy. RADIATION DOSE REDUCTION: This exam was performed according to the departmental dose-optimization program which includes automated exposure control, adjustment of the mA and/or kV according to patient size and/or use of iterative reconstruction technique. CONTRAST:  75mL OMNIPAQUE IOHEXOL 350 MG/ML SOLN COMPARISON:  Chest CT dated 08/27/2022. FINDINGS: Cardiovascular: There is no pulmonary embolism identified within the main, lobar or segmental pulmonary arteries bilaterally. No thoracic aortic aneurysm. Scattered aortic atherosclerosis. Cardiomegaly. No pericardial effusion. Diffuse three-vessel coronary artery calcifications. Mediastinum/Nodes: No mass or enlarged lymph nodes within the mediastinum or perihilar regions. Esophagus is unremarkable. Trachea and central bronchi are unremarkable. Lungs/Pleura: Emphysematous changes bilaterally. Chronic scarring/atelectasis at the bilateral lung bases. New nodular consolidation within the posterior RIGHT lung, at the junction of the RIGHT upper lobe and RIGHT lower lobe, measuring 2.9 x 1 cm (series 6, image 67). No pleural effusion or pneumothorax. Upper Abdomen: No acute abnormality. Musculoskeletal: New mild compression deformity of the T11 vertebral body. Osseous structures about the chest are otherwise unremarkable. Review of the MIP images confirms the above findings. IMPRESSION: 1. New nodular consolidation within the posterior RIGHT lung, at the junction of the RIGHT upper lobe and RIGHT lower lobe, measuring 2.9 x 1 cm. This is most likely a small focus of pneumonia or atelectasis. Recommend follow-up  chest CT in 2-3 months to ensure resolution. If persisted, atypical appearance of a neoplastic process could not  be excluded and PET-CT would be recommended for further characterization. 2. No pulmonary embolism. 3. Cardiomegaly. Diffuse three-vessel coronary artery calcifications. 4. New mild compression fracture deformity of the T11 vertebral body, age indeterminate. If any recent trauma or midline back pain, would consider nonemergent MRI of the thoracolumbar spine for further characterization. Aortic Atherosclerosis (ICD10-I70.0) and Emphysema (ICD10-J43.9). Electronically Signed   By: Bary Richard M.D.   On: 02/17/2023 08:50   DG Chest Port 1 View  Result Date: 02/16/2023 CLINICAL DATA:  Questionable sepsis EXAM: PORTABLE CHEST 1 VIEW COMPARISON:  Chest radiograph 09/25/2022 FINDINGS: Mild cardiomegaly is unchanged. The upper mediastinal contours are stable. Coarsened interstitial markings particularly in the lung bases are again seen, unchanged. Lucency in the upper lungs is unchanged consistent with underlying emphysema. There is no new focal airspace opacity. There is no pulmonary edema. There is no pleural effusion or pneumothorax. There is no acute osseous abnormality. IMPRESSION: 1. Unchanged cardiomegaly, coarsened interstitial markings, and emphysema. 2. No convincing acute airspace opacity. Electronically Signed   By: Lesia Hausen M.D.   On: 02/16/2023 10:51    Microbiology: Results for orders placed or performed during the hospital encounter of 02/16/23  Blood Culture (routine x 2)     Status: None   Collection Time: 02/16/23  9:43 AM   Specimen: BLOOD  Result Value Ref Range Status   Specimen Description BLOOD RIGHT ANTECUBITAL  Final   Special Requests   Final    BOTTLES DRAWN AEROBIC AND ANAEROBIC Blood Culture adequate volume   Culture   Final    NO GROWTH 5 DAYS Performed at [facility], [address], [place], [place] [address]    Report Status 02/21/2023 FINAL  Final  Blood Culture (routine x 2)     Status: None   Collection Time: 02/16/23  9:44 AM   Specimen: BLOOD   Result Value Ref Range Status   Specimen Description BLOOD LEFT ANTECUBITAL  Final   Special Requests   Final    BOTTLES DRAWN AEROBIC AND ANAEROBIC Blood Culture adequate volume   Culture   Final    NO GROWTH 5 DAYS Performed at [facility], [address], [place], [place] [address]    Report Status 02/21/2023 FINAL  Final  Resp panel by RT-PCR (RSV, Flu A&B, Covid) Anterior Nasal Swab     Status: None   Collection Time: 02/16/23 12:06 PM   Specimen: Anterior Nasal Swab  Result Value Ref Range Status   SARS Coronavirus 2 by RT PCR NEGATIVE NEGATIVE Final    Comment: (NOTE) SARS-CoV-2 target nucleic acids are NOT DETECTED.  The SARS-CoV-2 RNA is generally detectable in upper respiratory specimens during the acute phase of infection. The lowest concentration of SARS-CoV-2 viral copies this assay can detect is 138 copies/mL. A negative result does not preclude SARS-Cov-2 infection and should not be used as the sole basis for treatment or other patient management decisions. A negative result may occur with  improper specimen collection/handling, submission of specimen other than nasopharyngeal swab, presence of viral mutation(s) within the areas targeted by this assay, and inadequate number of viral copies(<138 copies/mL). A negative result must be combined with clinical observations, patient history, and epidemiological information. The expected result is Negative.  Fact Sheet for Patients:  BloggerCourse.com  Fact Sheet for Healthcare Providers:  SeriousBroker.it  This test is no t yet approved or  cleared by the [place] and  has been authorized for detection and/or diagnosis of SARS-CoV-2 by FDA under an Emergency Use Authorization (EUA). This EUA will remain  in effect (meaning this test can be used) for the duration of the COVID-19 declaration under Section 564(b)(1) of the Act, 21 U.S.C.section  360bbb-3(b)(1), unless the authorization is terminated  or revoked sooner.       Influenza A by PCR NEGATIVE NEGATIVE Final   Influenza B by PCR NEGATIVE NEGATIVE Final    Comment: (NOTE) The Xpert Xpress SARS-CoV-2/FLU/RSV plus assay is intended as an aid in the diagnosis of influenza from Nasopharyngeal swab specimens and should not be used as a sole basis for treatment. Nasal washings and aspirates are unacceptable for Xpert Xpress SARS-CoV-2/FLU/RSV testing.  Fact Sheet for Patients: BloggerCourse.com  Fact Sheet for Healthcare Providers: SeriousBroker.it  This test is not yet approved or cleared by the [place] FDA and has been authorized for detection and/or diagnosis of SARS-CoV-2 by FDA under an Emergency Use Authorization (EUA). This EUA will remain in effect (meaning this test can be used) for the duration of the COVID-19 declaration under Section 564(b)(1) of the Act, 21 U.S.C. section 360bbb-3(b)(1), unless the authorization is terminated or revoked.     Resp Syncytial Virus by PCR NEGATIVE NEGATIVE Final    Comment: (NOTE) Fact Sheet for Patients: BloggerCourse.com  Fact Sheet for Healthcare Providers: SeriousBroker.it  This test is not yet approved or cleared by the [place] FDA and has been authorized for detection and/or diagnosis of SARS-CoV-2 by FDA under an Emergency Use Authorization (EUA). This EUA will remain in effect (meaning this test can be used) for the duration of the COVID-19 declaration under Section 564(b)(1) of the Act, 21 U.S.C. section 360bbb-3(b)(1), unless the authorization is terminated or revoked.  Performed at [facility], [address] [place], [place] [address]   Respiratory (~20 pathogens) panel by PCR     Status: None   Collection Time: 02/25/23 11:25 AM   Specimen: Nasopharyngeal Swab; Respiratory   Result Value Ref Range Status   Adenovirus NOT DETECTED NOT DETECTED Final   Coronavirus 229E NOT DETECTED NOT DETECTED Final    Comment: (NOTE) The Coronavirus on the Respiratory Panel, DOES NOT test for the novel  Coronavirus (2019 nCoV)    Coronavirus HKU1 NOT DETECTED NOT DETECTED Final   Coronavirus NL63 NOT DETECTED NOT DETECTED Final   Coronavirus OC43 NOT DETECTED NOT DETECTED Final   Metapneumovirus NOT DETECTED NOT DETECTED Final   Rhinovirus / Enterovirus NOT DETECTED NOT DETECTED Final   Influenza A NOT DETECTED NOT DETECTED Final   Influenza B NOT DETECTED NOT DETECTED Final   Parainfluenza Virus 1 NOT DETECTED NOT DETECTED Final   Parainfluenza Virus 2 NOT DETECTED NOT DETECTED Final   Parainfluenza Virus 3 NOT DETECTED NOT DETECTED Final   Parainfluenza Virus 4 NOT DETECTED NOT DETECTED Final   Respiratory Syncytial Virus NOT DETECTED NOT DETECTED Final   Bordetella pertussis NOT DETECTED NOT DETECTED Final   Bordetella Parapertussis NOT DETECTED NOT DETECTED Final   Chlamydophila pneumoniae NOT DETECTED NOT DETECTED Final   Mycoplasma pneumoniae NOT DETECTED NOT DETECTED Final    Comment: Performed at [facility], [address] [place], [place] [address]  SARS Coronavirus 2 by RT PCR (hospital order, performed in [facility] hospital lab) *cepheid single result test* Anterior Nasal Swab     Status: None   Collection Time: 02/25/23 11:25 AM  Specimen: Anterior Nasal Swab  Result Value Ref Range Status   SARS Coronavirus 2 by RT PCR NEGATIVE NEGATIVE Final    Comment: (NOTE) SARS-CoV-2 target nucleic acids are NOT DETECTED.  The SARS-CoV-2 RNA is generally detectable in upper and lower respiratory specimens during the acute phase of infection. The lowest concentration of SARS-CoV-2 viral copies this assay can detect is 250 copies / mL. A negative result does not preclude SARS-CoV-2 infection and should not be used as the sole basis for treatment or  other patient management decisions.  A negative result may occur with improper specimen collection / handling, submission of specimen other than nasopharyngeal swab, presence of viral mutation(s) within the areas targeted by this assay, and inadequate number of viral copies (<250 copies / mL). A negative result must be combined with clinical observations, patient history, and epidemiological information.  Fact Sheet for Patients:   RoadLapTop.co.za  Fact Sheet for Healthcare Providers: http://kim-miller.com/  This test is not yet approved or  cleared by the [place] FDA and has been authorized for detection and/or diagnosis of SARS-CoV-2 by FDA under an Emergency Use Authorization (EUA).  This EUA will remain in effect (meaning this test can be used) for the duration of the COVID-19 declaration under Section 564(b)(1) of the Act, 21 U.S.C. section 360bbb-3(b)(1), unless the authorization is terminated or revoked sooner.  Performed at [facility], [address] [place], [place] [address]     Labs: CBC: Recent Labs  Lab 02/26/23 [phone] 02/27/23 0547 02/28/23 0428 03/01/23 0504  WBC 16.2* 15.9* 20.4* 19.8*  HGB 15.8 14.3 14.6 14.5  HCT 50.6 46.3 45.2 47.4  MCV 91.2 90.4 89.2 92.8  PLT 433* 348 352 340   Basic Metabolic Panel: Recent Labs  Lab 02/26/23 0450 02/27/23 0547 02/28/23 2029 03/02/23 0749  NA 133* 132* 133* 130*  K 4.3 4.2 5.4* 4.7  CL 85* 88* 94* 95*  CO2 35* 38* 31 31  GLUCOSE 108* 108* 160* 88  BUN 28* 27* 23 25*  CREATININE 0.93 0.85 0.89 0.85  CALCIUM 9.0 8.5* 8.5* 8.3*  MG  --   --  2.5*  --     Discharge time spent: greater than 30 minutes.  Signed: Enedina Finner, MD [facility] 03/04/2023

## 2023-03-05 ENCOUNTER — Other Ambulatory Visit: Payer: Self-pay | Admitting: Nurse Practitioner

## 2023-03-06 DIAGNOSIS — S32000A Wedge compression fracture of unspecified lumbar vertebra, initial encounter for closed fracture: Secondary | ICD-10-CM | POA: Diagnosis not present

## 2023-03-06 DIAGNOSIS — I87311 Chronic venous hypertension (idiopathic) with ulcer of right lower extremity: Secondary | ICD-10-CM | POA: Diagnosis not present

## 2023-03-06 DIAGNOSIS — I5042 Chronic combined systolic (congestive) and diastolic (congestive) heart failure: Secondary | ICD-10-CM | POA: Diagnosis not present

## 2023-03-06 DIAGNOSIS — I480 Paroxysmal atrial fibrillation: Secondary | ICD-10-CM | POA: Diagnosis not present

## 2023-03-06 DIAGNOSIS — I10 Essential (primary) hypertension: Secondary | ICD-10-CM | POA: Diagnosis not present

## 2023-03-06 DIAGNOSIS — I739 Peripheral vascular disease, unspecified: Secondary | ICD-10-CM | POA: Diagnosis not present

## 2023-03-06 DIAGNOSIS — M5459 Other low back pain: Secondary | ICD-10-CM | POA: Diagnosis not present

## 2023-03-06 DIAGNOSIS — L03115 Cellulitis of right lower limb: Secondary | ICD-10-CM | POA: Diagnosis not present

## 2023-03-06 DIAGNOSIS — J441 Chronic obstructive pulmonary disease with (acute) exacerbation: Secondary | ICD-10-CM | POA: Diagnosis not present

## 2023-03-06 DIAGNOSIS — M6281 Muscle weakness (generalized): Secondary | ICD-10-CM | POA: Diagnosis not present

## 2023-03-07 DIAGNOSIS — I480 Paroxysmal atrial fibrillation: Secondary | ICD-10-CM | POA: Diagnosis not present

## 2023-03-07 DIAGNOSIS — L988 Other specified disorders of the skin and subcutaneous tissue: Secondary | ICD-10-CM | POA: Diagnosis not present

## 2023-03-07 DIAGNOSIS — S32050D Wedge compression fracture of fifth lumbar vertebra, subsequent encounter for fracture with routine healing: Secondary | ICD-10-CM | POA: Diagnosis not present

## 2023-03-07 DIAGNOSIS — R278 Other lack of coordination: Secondary | ICD-10-CM | POA: Diagnosis not present

## 2023-03-07 DIAGNOSIS — Z4789 Encounter for other orthopedic aftercare: Secondary | ICD-10-CM | POA: Diagnosis not present

## 2023-03-07 DIAGNOSIS — I83018 Varicose veins of right lower extremity with ulcer other part of lower leg: Secondary | ICD-10-CM | POA: Diagnosis not present

## 2023-03-07 DIAGNOSIS — M6281 Muscle weakness (generalized): Secondary | ICD-10-CM | POA: Diagnosis not present

## 2023-03-07 DIAGNOSIS — S22080D Wedge compression fracture of T11-T12 vertebra, subsequent encounter for fracture with routine healing: Secondary | ICD-10-CM | POA: Diagnosis not present

## 2023-03-07 DIAGNOSIS — I5022 Chronic systolic (congestive) heart failure: Secondary | ICD-10-CM | POA: Diagnosis not present

## 2023-03-07 DIAGNOSIS — J449 Chronic obstructive pulmonary disease, unspecified: Secondary | ICD-10-CM | POA: Diagnosis not present

## 2023-03-07 DIAGNOSIS — S32030D Wedge compression fracture of third lumbar vertebra, subsequent encounter for fracture with routine healing: Secondary | ICD-10-CM | POA: Diagnosis not present

## 2023-03-07 DIAGNOSIS — J9611 Chronic respiratory failure with hypoxia: Secondary | ICD-10-CM | POA: Diagnosis not present

## 2023-03-09 DIAGNOSIS — I87311 Chronic venous hypertension (idiopathic) with ulcer of right lower extremity: Secondary | ICD-10-CM | POA: Diagnosis not present

## 2023-03-09 DIAGNOSIS — J441 Chronic obstructive pulmonary disease with (acute) exacerbation: Secondary | ICD-10-CM | POA: Diagnosis not present

## 2023-03-09 DIAGNOSIS — I480 Paroxysmal atrial fibrillation: Secondary | ICD-10-CM | POA: Diagnosis not present

## 2023-03-09 DIAGNOSIS — I5042 Chronic combined systolic (congestive) and diastolic (congestive) heart failure: Secondary | ICD-10-CM | POA: Diagnosis not present

## 2023-03-09 DIAGNOSIS — L03115 Cellulitis of right lower limb: Secondary | ICD-10-CM | POA: Diagnosis not present

## 2023-03-09 DIAGNOSIS — M5459 Other low back pain: Secondary | ICD-10-CM | POA: Diagnosis not present

## 2023-03-09 DIAGNOSIS — I10 Essential (primary) hypertension: Secondary | ICD-10-CM | POA: Diagnosis not present

## 2023-03-09 DIAGNOSIS — S32000A Wedge compression fracture of unspecified lumbar vertebra, initial encounter for closed fracture: Secondary | ICD-10-CM | POA: Diagnosis not present

## 2023-03-09 DIAGNOSIS — M6281 Muscle weakness (generalized): Secondary | ICD-10-CM | POA: Diagnosis not present

## 2023-03-09 DIAGNOSIS — I739 Peripheral vascular disease, unspecified: Secondary | ICD-10-CM | POA: Diagnosis not present

## 2023-03-10 DIAGNOSIS — M5459 Other low back pain: Secondary | ICD-10-CM | POA: Diagnosis not present

## 2023-03-10 DIAGNOSIS — S32030D Wedge compression fracture of third lumbar vertebra, subsequent encounter for fracture with routine healing: Secondary | ICD-10-CM | POA: Diagnosis not present

## 2023-03-10 DIAGNOSIS — S32000A Wedge compression fracture of unspecified lumbar vertebra, initial encounter for closed fracture: Secondary | ICD-10-CM | POA: Diagnosis not present

## 2023-03-10 DIAGNOSIS — J441 Chronic obstructive pulmonary disease with (acute) exacerbation: Secondary | ICD-10-CM | POA: Diagnosis not present

## 2023-03-10 DIAGNOSIS — L03115 Cellulitis of right lower limb: Secondary | ICD-10-CM | POA: Diagnosis not present

## 2023-03-10 DIAGNOSIS — I739 Peripheral vascular disease, unspecified: Secondary | ICD-10-CM | POA: Diagnosis not present

## 2023-03-10 DIAGNOSIS — M6281 Muscle weakness (generalized): Secondary | ICD-10-CM | POA: Diagnosis not present

## 2023-03-10 DIAGNOSIS — I480 Paroxysmal atrial fibrillation: Secondary | ICD-10-CM | POA: Diagnosis not present

## 2023-03-10 DIAGNOSIS — S22080D Wedge compression fracture of T11-T12 vertebra, subsequent encounter for fracture with routine healing: Secondary | ICD-10-CM | POA: Diagnosis not present

## 2023-03-10 DIAGNOSIS — I5022 Chronic systolic (congestive) heart failure: Secondary | ICD-10-CM | POA: Diagnosis not present

## 2023-03-10 DIAGNOSIS — R278 Other lack of coordination: Secondary | ICD-10-CM | POA: Diagnosis not present

## 2023-03-10 DIAGNOSIS — S32050D Wedge compression fracture of fifth lumbar vertebra, subsequent encounter for fracture with routine healing: Secondary | ICD-10-CM | POA: Diagnosis not present

## 2023-03-10 DIAGNOSIS — J9611 Chronic respiratory failure with hypoxia: Secondary | ICD-10-CM | POA: Diagnosis not present

## 2023-03-10 DIAGNOSIS — I87311 Chronic venous hypertension (idiopathic) with ulcer of right lower extremity: Secondary | ICD-10-CM | POA: Diagnosis not present

## 2023-03-10 DIAGNOSIS — I5042 Chronic combined systolic (congestive) and diastolic (congestive) heart failure: Secondary | ICD-10-CM | POA: Diagnosis not present

## 2023-03-10 DIAGNOSIS — J449 Chronic obstructive pulmonary disease, unspecified: Secondary | ICD-10-CM | POA: Diagnosis not present

## 2023-03-10 DIAGNOSIS — Z4789 Encounter for other orthopedic aftercare: Secondary | ICD-10-CM | POA: Diagnosis not present

## 2023-03-13 DIAGNOSIS — J441 Chronic obstructive pulmonary disease with (acute) exacerbation: Secondary | ICD-10-CM | POA: Diagnosis not present

## 2023-03-13 DIAGNOSIS — I10 Essential (primary) hypertension: Secondary | ICD-10-CM | POA: Diagnosis not present

## 2023-03-13 DIAGNOSIS — M5459 Other low back pain: Secondary | ICD-10-CM | POA: Diagnosis not present

## 2023-03-13 DIAGNOSIS — I5042 Chronic combined systolic (congestive) and diastolic (congestive) heart failure: Secondary | ICD-10-CM | POA: Diagnosis not present

## 2023-03-13 DIAGNOSIS — S32000A Wedge compression fracture of unspecified lumbar vertebra, initial encounter for closed fracture: Secondary | ICD-10-CM | POA: Diagnosis not present

## 2023-03-13 DIAGNOSIS — L03115 Cellulitis of right lower limb: Secondary | ICD-10-CM | POA: Diagnosis not present

## 2023-03-13 DIAGNOSIS — I480 Paroxysmal atrial fibrillation: Secondary | ICD-10-CM | POA: Diagnosis not present

## 2023-03-13 DIAGNOSIS — I739 Peripheral vascular disease, unspecified: Secondary | ICD-10-CM | POA: Diagnosis not present

## 2023-03-13 DIAGNOSIS — I87311 Chronic venous hypertension (idiopathic) with ulcer of right lower extremity: Secondary | ICD-10-CM | POA: Diagnosis not present

## 2023-03-13 DIAGNOSIS — M6281 Muscle weakness (generalized): Secondary | ICD-10-CM | POA: Diagnosis not present

## 2023-03-14 DIAGNOSIS — S32030D Wedge compression fracture of third lumbar vertebra, subsequent encounter for fracture with routine healing: Secondary | ICD-10-CM | POA: Diagnosis not present

## 2023-03-14 DIAGNOSIS — I5022 Chronic systolic (congestive) heart failure: Secondary | ICD-10-CM | POA: Diagnosis not present

## 2023-03-14 DIAGNOSIS — S32050D Wedge compression fracture of fifth lumbar vertebra, subsequent encounter for fracture with routine healing: Secondary | ICD-10-CM | POA: Diagnosis not present

## 2023-03-14 DIAGNOSIS — R042 Hemoptysis: Secondary | ICD-10-CM | POA: Diagnosis not present

## 2023-03-14 DIAGNOSIS — M6281 Muscle weakness (generalized): Secondary | ICD-10-CM | POA: Diagnosis not present

## 2023-03-14 DIAGNOSIS — S22080D Wedge compression fracture of T11-T12 vertebra, subsequent encounter for fracture with routine healing: Secondary | ICD-10-CM | POA: Diagnosis not present

## 2023-03-14 DIAGNOSIS — S32000A Wedge compression fracture of unspecified lumbar vertebra, initial encounter for closed fracture: Secondary | ICD-10-CM | POA: Diagnosis not present

## 2023-03-14 DIAGNOSIS — I87311 Chronic venous hypertension (idiopathic) with ulcer of right lower extremity: Secondary | ICD-10-CM | POA: Diagnosis not present

## 2023-03-14 DIAGNOSIS — J9611 Chronic respiratory failure with hypoxia: Secondary | ICD-10-CM | POA: Diagnosis not present

## 2023-03-14 DIAGNOSIS — L988 Other specified disorders of the skin and subcutaneous tissue: Secondary | ICD-10-CM | POA: Diagnosis not present

## 2023-03-14 DIAGNOSIS — J441 Chronic obstructive pulmonary disease with (acute) exacerbation: Secondary | ICD-10-CM | POA: Diagnosis not present

## 2023-03-14 DIAGNOSIS — I5042 Chronic combined systolic (congestive) and diastolic (congestive) heart failure: Secondary | ICD-10-CM | POA: Diagnosis not present

## 2023-03-14 DIAGNOSIS — M7981 Nontraumatic hematoma of soft tissue: Secondary | ICD-10-CM | POA: Diagnosis not present

## 2023-03-14 DIAGNOSIS — I83018 Varicose veins of right lower extremity with ulcer other part of lower leg: Secondary | ICD-10-CM | POA: Diagnosis not present

## 2023-03-14 DIAGNOSIS — J449 Chronic obstructive pulmonary disease, unspecified: Secondary | ICD-10-CM | POA: Diagnosis not present

## 2023-03-14 DIAGNOSIS — I739 Peripheral vascular disease, unspecified: Secondary | ICD-10-CM | POA: Diagnosis not present

## 2023-03-14 DIAGNOSIS — M5459 Other low back pain: Secondary | ICD-10-CM | POA: Diagnosis not present

## 2023-03-14 DIAGNOSIS — L03115 Cellulitis of right lower limb: Secondary | ICD-10-CM | POA: Diagnosis not present

## 2023-03-14 DIAGNOSIS — R278 Other lack of coordination: Secondary | ICD-10-CM | POA: Diagnosis not present

## 2023-03-14 DIAGNOSIS — I480 Paroxysmal atrial fibrillation: Secondary | ICD-10-CM | POA: Diagnosis not present

## 2023-03-14 DIAGNOSIS — Z4789 Encounter for other orthopedic aftercare: Secondary | ICD-10-CM | POA: Diagnosis not present

## 2023-03-15 ENCOUNTER — Other Ambulatory Visit: Payer: Self-pay | Admitting: Nurse Practitioner

## 2023-03-16 ENCOUNTER — Ambulatory Visit: Payer: HMO | Admitting: Neurosurgery

## 2023-03-16 DIAGNOSIS — M7981 Nontraumatic hematoma of soft tissue: Secondary | ICD-10-CM | POA: Diagnosis not present

## 2023-03-16 DIAGNOSIS — I739 Peripheral vascular disease, unspecified: Secondary | ICD-10-CM | POA: Diagnosis not present

## 2023-03-16 DIAGNOSIS — S32000A Wedge compression fracture of unspecified lumbar vertebra, initial encounter for closed fracture: Secondary | ICD-10-CM | POA: Diagnosis not present

## 2023-03-16 DIAGNOSIS — R042 Hemoptysis: Secondary | ICD-10-CM | POA: Diagnosis not present

## 2023-03-16 DIAGNOSIS — I5042 Chronic combined systolic (congestive) and diastolic (congestive) heart failure: Secondary | ICD-10-CM | POA: Diagnosis not present

## 2023-03-16 DIAGNOSIS — I480 Paroxysmal atrial fibrillation: Secondary | ICD-10-CM | POA: Diagnosis not present

## 2023-03-16 DIAGNOSIS — L03115 Cellulitis of right lower limb: Secondary | ICD-10-CM | POA: Diagnosis not present

## 2023-03-16 DIAGNOSIS — I87311 Chronic venous hypertension (idiopathic) with ulcer of right lower extremity: Secondary | ICD-10-CM | POA: Diagnosis not present

## 2023-03-16 DIAGNOSIS — J441 Chronic obstructive pulmonary disease with (acute) exacerbation: Secondary | ICD-10-CM | POA: Diagnosis not present

## 2023-03-16 DIAGNOSIS — M6281 Muscle weakness (generalized): Secondary | ICD-10-CM | POA: Diagnosis not present

## 2023-03-16 DIAGNOSIS — M5459 Other low back pain: Secondary | ICD-10-CM | POA: Diagnosis not present

## 2023-03-16 DIAGNOSIS — I10 Essential (primary) hypertension: Secondary | ICD-10-CM | POA: Diagnosis not present

## 2023-03-17 DIAGNOSIS — I480 Paroxysmal atrial fibrillation: Secondary | ICD-10-CM | POA: Diagnosis not present

## 2023-03-17 DIAGNOSIS — I5042 Chronic combined systolic (congestive) and diastolic (congestive) heart failure: Secondary | ICD-10-CM | POA: Diagnosis not present

## 2023-03-17 DIAGNOSIS — S32050D Wedge compression fracture of fifth lumbar vertebra, subsequent encounter for fracture with routine healing: Secondary | ICD-10-CM | POA: Diagnosis not present

## 2023-03-17 DIAGNOSIS — Z4789 Encounter for other orthopedic aftercare: Secondary | ICD-10-CM | POA: Diagnosis not present

## 2023-03-17 DIAGNOSIS — R042 Hemoptysis: Secondary | ICD-10-CM | POA: Diagnosis not present

## 2023-03-17 DIAGNOSIS — S32030D Wedge compression fracture of third lumbar vertebra, subsequent encounter for fracture with routine healing: Secondary | ICD-10-CM | POA: Diagnosis not present

## 2023-03-17 DIAGNOSIS — S22080D Wedge compression fracture of T11-T12 vertebra, subsequent encounter for fracture with routine healing: Secondary | ICD-10-CM | POA: Diagnosis not present

## 2023-03-17 DIAGNOSIS — J441 Chronic obstructive pulmonary disease with (acute) exacerbation: Secondary | ICD-10-CM | POA: Diagnosis not present

## 2023-03-17 DIAGNOSIS — S32000A Wedge compression fracture of unspecified lumbar vertebra, initial encounter for closed fracture: Secondary | ICD-10-CM | POA: Diagnosis not present

## 2023-03-17 DIAGNOSIS — M5459 Other low back pain: Secondary | ICD-10-CM | POA: Diagnosis not present

## 2023-03-17 DIAGNOSIS — M7981 Nontraumatic hematoma of soft tissue: Secondary | ICD-10-CM | POA: Diagnosis not present

## 2023-03-17 DIAGNOSIS — I10 Essential (primary) hypertension: Secondary | ICD-10-CM | POA: Diagnosis not present

## 2023-03-17 DIAGNOSIS — J9611 Chronic respiratory failure with hypoxia: Secondary | ICD-10-CM | POA: Diagnosis not present

## 2023-03-17 DIAGNOSIS — I5022 Chronic systolic (congestive) heart failure: Secondary | ICD-10-CM | POA: Diagnosis not present

## 2023-03-17 DIAGNOSIS — J449 Chronic obstructive pulmonary disease, unspecified: Secondary | ICD-10-CM | POA: Diagnosis not present

## 2023-03-17 DIAGNOSIS — F413 Other mixed anxiety disorders: Secondary | ICD-10-CM | POA: Diagnosis not present

## 2023-03-17 DIAGNOSIS — R278 Other lack of coordination: Secondary | ICD-10-CM | POA: Diagnosis not present

## 2023-03-17 DIAGNOSIS — I739 Peripheral vascular disease, unspecified: Secondary | ICD-10-CM | POA: Diagnosis not present

## 2023-03-17 DIAGNOSIS — M6281 Muscle weakness (generalized): Secondary | ICD-10-CM | POA: Diagnosis not present

## 2023-03-17 DIAGNOSIS — L03115 Cellulitis of right lower limb: Secondary | ICD-10-CM | POA: Diagnosis not present

## 2023-03-20 ENCOUNTER — Encounter: Payer: Self-pay | Admitting: Orthopedic Surgery

## 2023-03-20 ENCOUNTER — Ambulatory Visit
Admission: RE | Admit: 2023-03-20 | Discharge: 2023-03-20 | Disposition: A | Discharge status: Home / Self Care | Payer: HMO | Attending: Orthopedic Surgery | Admitting: Orthopedic Surgery

## 2023-03-20 ENCOUNTER — Ambulatory Visit
Admission: RE | Admit: 2023-03-20 | Discharge: 2023-03-20 | Disposition: A | Discharge status: Home / Self Care | Payer: HMO | Source: Ambulatory Visit | Attending: Orthopedic Surgery | Admitting: Orthopedic Surgery

## 2023-03-20 ENCOUNTER — Ambulatory Visit: Payer: HMO | Admitting: Orthopedic Surgery

## 2023-03-20 ENCOUNTER — Ambulatory Visit (INDEPENDENT_AMBULATORY_CARE_PROVIDER_SITE_OTHER): Payer: HMO | Admitting: Orthopedic Surgery

## 2023-03-20 ENCOUNTER — Telehealth: Payer: Self-pay | Admitting: Cardiovascular Disease

## 2023-03-20 ENCOUNTER — Telehealth: Payer: Self-pay | Admitting: *Deleted

## 2023-03-20 VITALS — BP 128/84 | Ht 69.0 in

## 2023-03-20 DIAGNOSIS — S32030D Wedge compression fracture of third lumbar vertebra, subsequent encounter for fracture with routine healing: Secondary | ICD-10-CM

## 2023-03-20 DIAGNOSIS — S32050S Wedge compression fracture of fifth lumbar vertebra, sequela: Secondary | ICD-10-CM | POA: Insufficient documentation

## 2023-03-20 DIAGNOSIS — S32050D Wedge compression fracture of fifth lumbar vertebra, subsequent encounter for fracture with routine healing: Secondary | ICD-10-CM

## 2023-03-20 DIAGNOSIS — I4821 Permanent atrial fibrillation: Secondary | ICD-10-CM

## 2023-03-20 DIAGNOSIS — S22080D Wedge compression fracture of T11-T12 vertebra, subsequent encounter for fracture with routine healing: Secondary | ICD-10-CM | POA: Diagnosis not present

## 2023-03-20 DIAGNOSIS — S32030S Wedge compression fracture of third lumbar vertebra, sequela: Secondary | ICD-10-CM

## 2023-03-20 DIAGNOSIS — I480 Paroxysmal atrial fibrillation: Secondary | ICD-10-CM

## 2023-03-20 DIAGNOSIS — M4856XA Collapsed vertebra, not elsewhere classified, lumbar region, initial encounter for fracture: Secondary | ICD-10-CM | POA: Diagnosis not present

## 2023-03-20 DIAGNOSIS — S22080S Wedge compression fracture of T11-T12 vertebra, sequela: Secondary | ICD-10-CM

## 2023-03-20 NOTE — Progress Notes (Deleted)
Referring Physician:  No referring provider defined for this encounter.  Primary Physician:  Pcp, No  History of Present Illness: 03/20/2023*** Mr. Erik [name] has a history of COPD on 4L O2, CHF, afib, CAD, GERD, HTN, MI, and obesity.   Seen as hospital consult by Danielle on 02/21/23 for chronic back and leg pain x years. Felt a pop when getting out of bed a few days prior to consultation. He was found to have T11 and L3 compression fractures along with chronic L1, L2, L4, L5 fractures.   He was placed in TLSO brace for comfort when OOB. He then had kyphoplasty of T11, L3, and L5 on 02/28/23. He is here for follow up.          Duration: *** Location: *** Quality: *** Severity: ***  Precipitating: aggravated by *** Modifying factors: made better by *** Weakness: none Timing: *** Bowel/Bladder Dysfunction: none  Conservative measures:  Physical therapy: ***  Multimodal medical therapy including regular antiinflammatories: ***  Injections: *** epidural steroid injections  Past Surgery:  kyphoplasty of T11, L3, and L5 on 02/28/23  Demarrius L Gathers has ***no symptoms of cervical myelopathy.  The symptoms are causing a significant impact on the patient's life.   Review of Systems:  A 10 point review of systems is negative, except for the pertinent positives and negatives detailed in the HPI.  Past Medical History: Past Medical History:  Diagnosis Date   Arthritis    Asthma    Atrial fibrillation (HCC)    CHF (congestive heart failure) (HCC)    COPD (chronic obstructive pulmonary disease) (HCC)    Coronary artery disease    GERD (gastroesophageal reflux disease)    Hypertension    Myocardial infarction (HCC)    Shortness of breath dyspnea     Past Surgical History: Past Surgical History:  Procedure Laterality Date   ANGIOPLASTY  2009   CARDIAC CATHETERIZATION N/A 03/18/2015   Procedure: Left Heart Cath with Coronary Angiography;  Surgeon: Lamar Blinks,  MD;  Location: [facility] INVASIVE CV LAB;  Service: Cardiovascular;  Laterality: N/A;   CHOLECYSTECTOMY  2005   CORONARY ANGIOGRAM  2009   IR KYPHO THORACIC WITH BONE BIOPSY  02/28/2023   LOWER EXTREMITY ANGIOGRAPHY Right 08/31/2022   Procedure: Lower Extremity Angiography;  Surgeon: Renford Dills, MD;  Location: [facility] INVASIVE CV LAB;  Service: Cardiovascular;  Laterality: Right;    Allergies: Allergies as of 03/20/2023 - Review Complete 03/01/2023  Allergen Reaction Noted   Benadryl [diphenhydramine hcl] Shortness Of Breath 03/18/2015   Morphine and codeine Swelling 08/13/2017   Oxycodone Swelling 09/02/2022    Medications: Outpatient Encounter Medications as of 03/20/2023  Medication Sig   acetaminophen (TYLENOL) 325 MG tablet Take 2 tablets (650 mg total) by mouth every 6 (six) hours as needed for mild pain (or Fever >/= 101).   albuterol (VENTOLIN HFA) 108 (90 Base) MCG/ACT inhaler Inhale 1 puff into the lungs every 6 (six) hours as needed for wheezing or shortness of breath.   apixaban (ELIQUIS) 5 MG TABS tablet Take 1 tablet (5 mg total) by mouth 2 (two) times daily.   aspirin EC 81 MG tablet Take 1 tablet (81 mg total) by mouth daily. Swallow whole.   atorvastatin (LIPITOR) 80 MG tablet Take 1 tablet (80 mg total) by mouth daily.   dapagliflozin propanediol (FARXIGA) 10 MG TABS tablet Take 1 tablet (10 mg total) by mouth daily.   gabapentin (NEURONTIN) 100 MG capsule Take 100 mg by  mouth 3 (three) times daily. (Patient not taking: Reported on 02/16/2023)   guaiFENesin-dextromethorphan (ROBITUSSIN DM) 100-10 MG/5ML syrup Take 5 mLs by mouth every 4 (four) hours as needed for cough.   ipratropium-albuterol (DUONEB) 0.5-2.5 (3) MG/3ML SOLN USE 1 VIAL IN NEBULIZER EVERY 6 HOURS (Patient not taking: Reported on 02/16/2023)   losartan (COZAAR) 25 MG tablet Take 0.5 tablets (12.5 mg total) by mouth daily.   metoprolol succinate (TOPROL-XL) 100 MG 24 hr tablet Take 1 tablet (100 mg total) by  mouth daily. Take with or immediately following a meal.   nitroGLYCERIN (NITROSTAT) 0.4 MG SL tablet Place 1 tablet (0.4 mg total) under the tongue every 5 (five) minutes as needed for chest pain.   pantoprazole (PROTONIX) 40 MG tablet Take 1 tablet (40 mg total) by mouth daily.   polyethylene glycol (MIRALAX / GLYCOLAX) 17 g packet Take 17 g by mouth daily.   senna-docusate (SENOKOT-S) 8.6-50 MG tablet Take 2 tablets by mouth 2 (two) times daily.   tamsulosin (FLOMAX) 0.4 MG CAPS capsule Take 1 capsule (0.4 mg total) by mouth daily after supper.   torsemide 40 MG TABS Take 40 mg by mouth daily.   traMADol (ULTRAM) 50 MG tablet Take 2 tablets (100 mg total) by mouth every 6 (six) hours as needed for severe pain.   TRELEGY ELLIPTA 100-62.5-25 MCG/ACT AEPB INHALE 1 PUFF BY MOUTH INTO LUNGS DAILY   No facility-administered encounter medications on file as of 03/20/2023.    Social History: Social History   Tobacco Use   Smoking status: Former    Current packs/day: 0.00    Types: Cigarettes    Start date: 03/20/1972    Quit date: 03/20/1997    Years since quitting: 26.0   Smokeless tobacco: Never  Vaping Use   Vaping status: Never Used  Substance Use Topics   Alcohol use: No   Drug use: No    Family Medical History: Family History  Problem Relation Age of Onset   Coronary artery disease Father    Diabetes Father    Hyperlipidemia Father    Hypertension Father    Stroke Mother    Hyperlipidemia Mother     Physical Examination: There were no vitals filed for this visit.  General: Patient is well developed, well nourished, calm, collected, and in no apparent distress. Attention to examination is appropriate.  Respiratory: Patient is breathing without any difficulty.   NEUROLOGICAL:     Awake, alert, oriented to person, place, and time.  Speech is clear and fluent. Fund of knowledge is appropriate.   Cranial Nerves: Pupils equal round and reactive to light.  Facial tone is  symmetric.    *** ROM of cervical spine *** pain *** posterior cervical tenderness. *** tenderness in bilateral trapezial region.   *** ROM of lumbar spine *** pain *** posterior lumbar tenderness.   No abnormal lesions on exposed skin.   Strength: Side Biceps Triceps Deltoid Interossei Grip Wrist Ext. Wrist Flex.  R 5 5 5 5 5 5 5   L 5 5 5 5 5 5 5    Side Iliopsoas Quads Hamstring PF DF EHL  R 5 5 5 5 5 5   L 5 5 5 5 5 5    Reflexes are ***2+ and symmetric at the biceps, brachioradialis, patella and achilles.   Hoffman's is absent.  Clonus is not present.   Bilateral upper and lower extremity sensation is intact to light touch.     Gait is normal.   ***No  difficulty with tandem gait.    Medical Decision Making  Imaging: Lumbar xrays dated ***:  ***  Radiology report not available for above xrays.   MRI of thoracic and lumbar spine dated 02/22/23:   IMPRESSION: MR THORACIC SPINE IMPRESSION   Recent and un-healed T11 compression fracture with minimal height loss and no retropulsion.   MR LUMBAR SPINE IMPRESSION   1. Recent and un-healed compression fractures at L3 and L5 with up to 60% height loss at L5 where there is also posterosuperior corner retropulsion contributing to moderate thecal sac narrowing. 2. Remote and healed L2 and L4 compression fractures.     Electronically Signed   By: Tiburcio Pea M.D.   On: 02/22/2023 16:01   I have personally reviewed the images and agree with the above interpretation.  Assessment and Plan: Mr. Amesquita is a pleasant 67 y.o. male has ***  Treatment options discussed with patient and following plan made:   - Order for physical therapy for *** spine ***. Patient to call to schedule appointment. *** - Continue current medications including ***. Reviewed dosing and side effects.  - Prescription for ***. Reviewed dosing and side effects. Take with food.  - Prescription for *** to take prn muscle spasms. Reviewed dosing and  side effects. Discussed this can cause drowsiness.  - MRI of *** to further evaluate *** radiculopathy. No improvement time or medications (***).  - Referral to PMR at [facility] to discuss possible *** injections.  - Will schedule phone visit to review MRI results once I get them back.   I spent a total of *** minutes in face-to-face and non-face-to-face activities related to this patient's care today including review of outside records, review of imaging, review of symptoms, physical exam, discussion of differential diagnosis, discussion of treatment options, and documentation.   Thank you for involving me in the care of this patient.   [name] Leach PA-C Dept. of Neurosurgery

## 2023-03-20 NOTE — Telephone Encounter (Signed)
Pt c/o medication issue:  1. Name of Medication: apixaban (ELIQUIS) 5 MG TABS tablet   2. How are you currently taking this medication (dosage and times per day)?    3. Are you having a reaction (difficulty breathing--STAT)? no  4. What is your medication issue? His body is bruising bad and he is very sore. Wife has stop giving him the meds. Please advise

## 2023-03-20 NOTE — Telephone Encounter (Signed)
-----   Message from Eula Listen sent at 03/20/2023  1:11 PM EDT ----- Elita Quick,  Please see message outlined below.  Clopidogrel will not be sufficient for cardioembolic stroke risk reduction in the setting of his A-fib.  We can transition him from apixaban to rivaroxaban 20 mg daily with dinner, or get him set up with Coumadin clinic if he would like to initiate Coumadin.  I would also like to refer him to EP for consideration of Watchman given his concerns over anticoagulation.  Pam, please just let me know what he decides.  Thanks,   Ryan ----- Message ----- From: [name] Leach, PA-C Sent: 03/20/2023  12:14 PM EDT To: Sondra Barges, PA-C  Hi Ryan,   He has a f/u with you on 8/20.   I saw him today for f/u of his compression fractures. He states he was changed to eliquis in the hospital and he cannot take this due to side effects (rash, bruising, vomiting blood). He wants to go back on his plavix. He is currently at a facility.   Can you please reach out to him or his wife? They are very concerned.   Thank you!  [name] Leach PA-C [facility]

## 2023-03-20 NOTE — Telephone Encounter (Signed)
Duplicate . See other telephone encounter.

## 2023-03-20 NOTE — Progress Notes (Addendum)
Referring Physician:  No referring provider defined for this encounter.  Primary Physician:  Pcp, No  History of Present Illness: 03/20/2023 Mr. Dontrelle Sieker has a history of COPD on 4L O2, CHF, afib, CAD, GERD, HTN, MI, and obesity.   Seen as hospital consult by Danielle on 02/21/23 for chronic back and leg pain x years. Felt a pop when getting out of bed a few days prior to consultation. He was found to have T11 and L3 compression fractures along with chronic L1, L2, L4, L5 fractures.   He was placed in TLSO brace for comfort when OOB. He then had kyphoplasty of T11, L3, and L5 on 02/28/23. He is here for follow up.   He has no significant pain in his back. This improved after his kyphoplasty procedure. He is still wearing his TLSO. He notes bilateral leg pain that he had prior to recent fractures.   Stopped neurontin due to side effects (shaking). He also stopped his ELIQUIS. He states he is unable to take this due to side effects.   He has follow up with vascular and cardiology this month.   No bowel or bladder issues- no perineal numbness. He had one episode this morning when he had BM when they were getting him up. He did not have much notice prior to going. Wife states he is getting multiple laxatives.   Past Surgery:  kyphoplasty of T11, L3, and L5 on 02/28/23  Review of Systems:  A 10 point review of systems is negative, except for the pertinent positives and negatives detailed in the HPI.  Past Medical History: Past Medical History:  Diagnosis Date   Arthritis    Asthma    Atrial fibrillation (HCC)    CHF (congestive heart failure) (HCC)    COPD (chronic obstructive pulmonary disease) (HCC)    Coronary artery disease    GERD (gastroesophageal reflux disease)    Hypertension    Myocardial infarction (HCC)    Shortness of breath dyspnea     Past Surgical History: Past Surgical History:  Procedure Laterality Date   ANGIOPLASTY  2009   CARDIAC CATHETERIZATION N/A  03/18/2015   Procedure: Left Heart Cath with Coronary Angiography;  Surgeon: Lamar Blinks, MD;  Location: [facility];  Service: Cardiovascular;  Laterality: N/A;   CHOLECYSTECTOMY  2005   CORONARY ANGIOGRAM  2009   IR KYPHO THORACIC WITH BONE BIOPSY  02/28/2023   LOWER EXTREMITY ANGIOGRAPHY Right 08/31/2022   Procedure: Lower Extremity Angiography;  Surgeon: Renford Dills, MD;  Location: [facility];  Service: Cardiovascular;  Laterality: Right;    Allergies: Allergies as of 03/20/2023 - Review Complete 03/20/2023  Allergen Reaction Noted   Benadryl [diphenhydramine hcl] Shortness Of Breath 03/18/2015   Morphine and codeine Swelling 08/13/2017   Oxycodone Swelling 09/02/2022    Medications: Outpatient Encounter Medications as of 03/20/2023  Medication Sig   acetaminophen (TYLENOL) 325 MG tablet Take 2 tablets (650 mg total) by mouth every 6 (six) hours as needed for mild pain (or Fever >/= 101).   albuterol (VENTOLIN HFA) 108 (90 Base) MCG/ACT inhaler Inhale 1 puff into the lungs every 6 (six) hours as needed for wheezing or shortness of breath.   aspirin EC 81 MG tablet Take 1 tablet (81 mg total) by mouth daily. Swallow whole.   atorvastatin (LIPITOR) 80 MG tablet Take 1 tablet (80 mg total) by mouth daily.   dapagliflozin propanediol (FARXIGA) 10 MG TABS tablet Take 1 tablet (10  mg total) by mouth daily.   guaiFENesin-dextromethorphan (ROBITUSSIN DM) 100-10 MG/5ML syrup Take 5 mLs by mouth every 4 (four) hours as needed for cough.   ipratropium-albuterol (DUONEB) 0.5-2.5 (3) MG/3ML SOLN USE 1 VIAL IN NEBULIZER EVERY 6 HOURS   KLONOPIN 0.5 MG tablet Take 0.5 mg by mouth daily as needed.   losartan (COZAAR) 25 MG tablet Take 0.5 tablets (12.5 mg total) by mouth daily.   metoprolol succinate (TOPROL-XL) 100 MG 24 hr tablet Take 1 tablet (100 mg total) by mouth daily. Take with or immediately following a meal.   nitroGLYCERIN (NITROSTAT) 0.4 MG SL tablet Place 1  tablet (0.4 mg total) under the tongue every 5 (five) minutes as needed for chest pain.   pantoprazole (PROTONIX) 40 MG tablet Take 1 tablet (40 mg total) by mouth daily.   senna-docusate (SENOKOT-S) 8.6-50 MG tablet Take 2 tablets by mouth 2 (two) times daily.   tamsulosin (FLOMAX) 0.4 MG CAPS capsule Take 1 capsule (0.4 mg total) by mouth daily after supper.   torsemide 40 MG TABS Take 40 mg by mouth daily.   traMADol (ULTRAM) 50 MG tablet Take 2 tablets (100 mg total) by mouth every 6 (six) hours as needed for severe pain.   TRELEGY ELLIPTA 100-62.5-25 MCG/ACT AEPB INHALE 1 PUFF BY MOUTH INTO LUNGS DAILY   apixaban (ELIQUIS) 5 MG TABS tablet Take 1 tablet (5 mg total) by mouth 2 (two) times daily. (Patient not taking: Reported on 03/20/2023)   gabapentin (NEURONTIN) 100 MG capsule Take 100 mg by mouth 3 (three) times daily. (Patient not taking: Reported on 02/16/2023)   [DISCONTINUED] polyethylene glycol (MIRALAX / GLYCOLAX) 17 g packet Take 17 g by mouth daily.   No facility-administered encounter medications on file as of 03/20/2023.    Social History: Social History   Tobacco Use   Smoking status: Former    Current packs/day: 0.00    Types: Cigarettes    Start date: 03/20/1972    Quit date: 03/20/1997    Years since quitting: 26.0   Smokeless tobacco: Never  Vaping Use   Vaping status: Never Used  Substance Use Topics   Alcohol use: No   Drug use: No    Family Medical History: Family History  Problem Relation Age of Onset   Coronary artery disease Father    Diabetes Father    Hyperlipidemia Father    Hypertension Father    Stroke Mother    Hyperlipidemia Mother     Physical Examination: Vitals:   03/20/23 1133  BP: 128/84    General: Patient is well developed, well nourished, calm, collected, and in no apparent distress. Attention to examination is appropriate.  Respiratory: He is on O2 via [place].    NEUROLOGICAL:     Awake, alert, oriented to person, place, and  time.  Speech is clear and fluent. Fund of knowledge is appropriate.   Cranial Nerves: Pupils equal round and reactive to light.  Facial tone is symmetric.    He has no thoracic or lumbar tenderness.   No abnormal lesions on exposed skin.   Strength: Side Biceps Triceps Deltoid Interossei Grip Wrist Ext. Wrist Flex.  R 5 5 5 5 5 5 5   L 5 5 5 5 5 5 5    Side Iliopsoas Quads Hamstring PF DF EHL  R 5 5 5 5 5 5   L 5 5 5 5 5 5    No gross weakness in lower extremities, but he has pain with strength testing.  Has wrap on his right leg.   Bilateral upper and lower extremity sensation is intact to light touch.     Clonus is not present.   He is in a WC.    Medical Decision Making  Imaging: Lumbar xrays dated 03/20/23:  Kyphoplasty done at T11, L3, and L5.   Radiology report not available for above xrays.   MRI of thoracic and lumbar spine dated 02/22/23:   IMPRESSION: MR THORACIC SPINE IMPRESSION   Recent and un-healed T11 compression fracture with minimal height loss and no retropulsion.   MR LUMBAR SPINE IMPRESSION   1. Recent and un-healed compression fractures at L3 and L5 with up to 60% height loss at L5 where there is also posterosuperior corner retropulsion contributing to moderate thecal sac narrowing. 2. Remote and healed L2 and L4 compression fractures.     Electronically Signed   By: Tiburcio Pea M.D.   On: 02/22/2023 16:01   I have personally reviewed the images and agree with the above interpretation.  Assessment and Plan: Mr. Hiser is a pleasant 67 y.o. male has good improvement in back pain s/p kyphoplasty of T11, L3, and L5. He has no significant back pain.   He continues with bilateral leg pain. This may be vascular in nature as it is chronic. Review of notes shows he has known claudication.   Xrays show kyphoplasty at T11, L3, and L5.   Treatment options discussed with patient and following plan made:   - He can wear TLSO brace for comfort  only. If it does not help, he does not need to wear.  - Continue with PT at facility. Recommend he continue there until he is able to mobilize better at home.  - Message to cardiology Eula Listen PA-C) regarding his eliquis.  - Keep follow ups with cardiology and vascular.  - Happy to see him back if leg pain does not improve or if vascular feels it is more lumbar in nature.  - For now, he will f/u prn.   I spent a total of 30 minutes in face-to-face and non-face-to-face activities related to this patient's care today including review of outside records, review of imaging, review of symptoms, physical exam, discussion of differential diagnosis, discussion of treatment options, and documentation.   Thank you for involving me in the care of this patient.   [name] Leach PA-C Dept. of Neurosurgery

## 2023-03-20 NOTE — Telephone Encounter (Signed)
Erik [name] is already reaching out to him for this as I received a staff message from neurosurgery indicating the patient's concerns.  Clopidogrel will not be sufficient for cardioembolic stroke risk reduction in the setting of his A-fib.  We can transition him from apixaban to rivaroxaban 20 mg daily with dinner, or get him set up with Coumadin clinic if he would like to initiate Coumadin.  Please refer him to EP for consideration of [facility] given his concerns over anticoagulation.

## 2023-03-20 NOTE — Telephone Encounter (Signed)
Left voicemail message to call back  

## 2023-03-20 NOTE — Addendum Note (Signed)
Addended byDrake Leach on: 03/20/2023 07:54 AM   Modules accepted: Orders

## 2023-03-20 NOTE — Telephone Encounter (Signed)
Spoke with patients wife in regards to recommendations. She states he is at [facility] and was not sure about medications. Advised that we can discuss changes at his appointment next Tuesday and schedule him to see EP at that time as well. She verbalized understanding with no further questions at this time.   Discussed with provider and he was agreeable to discuss these changes at that office visit.

## 2023-03-21 DIAGNOSIS — I83018 Varicose veins of right lower extremity with ulcer other part of lower leg: Secondary | ICD-10-CM | POA: Diagnosis not present

## 2023-03-21 DIAGNOSIS — L988 Other specified disorders of the skin and subcutaneous tissue: Secondary | ICD-10-CM | POA: Diagnosis not present

## 2023-03-22 DIAGNOSIS — S32030D Wedge compression fracture of third lumbar vertebra, subsequent encounter for fracture with routine healing: Secondary | ICD-10-CM | POA: Diagnosis not present

## 2023-03-22 DIAGNOSIS — M6281 Muscle weakness (generalized): Secondary | ICD-10-CM | POA: Diagnosis not present

## 2023-03-22 DIAGNOSIS — Z515 Encounter for palliative care: Secondary | ICD-10-CM | POA: Diagnosis not present

## 2023-03-22 DIAGNOSIS — R278 Other lack of coordination: Secondary | ICD-10-CM | POA: Diagnosis not present

## 2023-03-22 DIAGNOSIS — S32050D Wedge compression fracture of fifth lumbar vertebra, subsequent encounter for fracture with routine healing: Secondary | ICD-10-CM | POA: Diagnosis not present

## 2023-03-22 DIAGNOSIS — Z4789 Encounter for other orthopedic aftercare: Secondary | ICD-10-CM | POA: Diagnosis not present

## 2023-03-22 DIAGNOSIS — S22080D Wedge compression fracture of T11-T12 vertebra, subsequent encounter for fracture with routine healing: Secondary | ICD-10-CM | POA: Diagnosis not present

## 2023-03-22 DIAGNOSIS — J9611 Chronic respiratory failure with hypoxia: Secondary | ICD-10-CM | POA: Diagnosis not present

## 2023-03-22 DIAGNOSIS — I5022 Chronic systolic (congestive) heart failure: Secondary | ICD-10-CM | POA: Diagnosis not present

## 2023-03-22 DIAGNOSIS — J449 Chronic obstructive pulmonary disease, unspecified: Secondary | ICD-10-CM | POA: Diagnosis not present

## 2023-03-22 DIAGNOSIS — I480 Paroxysmal atrial fibrillation: Secondary | ICD-10-CM | POA: Diagnosis not present

## 2023-03-27 DIAGNOSIS — M7981 Nontraumatic hematoma of soft tissue: Secondary | ICD-10-CM | POA: Diagnosis not present

## 2023-03-27 DIAGNOSIS — M6281 Muscle weakness (generalized): Secondary | ICD-10-CM | POA: Diagnosis not present

## 2023-03-27 DIAGNOSIS — I739 Peripheral vascular disease, unspecified: Secondary | ICD-10-CM | POA: Diagnosis not present

## 2023-03-27 DIAGNOSIS — J441 Chronic obstructive pulmonary disease with (acute) exacerbation: Secondary | ICD-10-CM | POA: Diagnosis not present

## 2023-03-27 DIAGNOSIS — I5042 Chronic combined systolic (congestive) and diastolic (congestive) heart failure: Secondary | ICD-10-CM | POA: Diagnosis not present

## 2023-03-27 DIAGNOSIS — R042 Hemoptysis: Secondary | ICD-10-CM | POA: Diagnosis not present

## 2023-03-27 DIAGNOSIS — L03115 Cellulitis of right lower limb: Secondary | ICD-10-CM | POA: Diagnosis not present

## 2023-03-27 DIAGNOSIS — M5459 Other low back pain: Secondary | ICD-10-CM | POA: Diagnosis not present

## 2023-03-27 DIAGNOSIS — I480 Paroxysmal atrial fibrillation: Secondary | ICD-10-CM | POA: Diagnosis not present

## 2023-03-27 DIAGNOSIS — S32000A Wedge compression fracture of unspecified lumbar vertebra, initial encounter for closed fracture: Secondary | ICD-10-CM | POA: Diagnosis not present

## 2023-03-27 DIAGNOSIS — F413 Other mixed anxiety disorders: Secondary | ICD-10-CM | POA: Diagnosis not present

## 2023-03-27 DIAGNOSIS — I10 Essential (primary) hypertension: Secondary | ICD-10-CM | POA: Diagnosis not present

## 2023-03-27 NOTE — Progress Notes (Unsigned)
Cardiology Office Note    Date:  03/28/2023   ID:  [name], DOB 09-28-1955, MRN [id number]  PCP:  Pcp, No  Cardiologist:  Lorine Bears, MD  Electrophysiologist:  None   Chief Complaint: Hospital follow-up  History of Present Illness:   Erik [name] is a 67 y.o. male with history of CAD status post BMS in 2009 to OM2 and CTO of the proximal RCA by LHC in 2016, permanent A-fib dating back to 2012, HFrEF, chronic hypoxic respiratory failure on supplemental oxygen (4 L) at baseline, PAD status post prior stenting to the right SFA and popliteal artery followed by vascular surgery, HTN, HLD, COPD, medical therapy adherence concerns, and arthritis who we are seeing for hospital follow-up as outlined below.  He was previously followed by [facility] cardiology.  He was admitted in 2014 with acute pancreatitis.  Pradaxa was discontinued at that time due to patient compliance concerns.  Most recent cardiac cath in 2016 that showed an occluded proximal RCA, 40% mid LAD stenosis, and 30% mid to distal LCx stenosis.  EF 35%.  Medical therapy was recommended.  Over the years, echo has demonstrated fluctuating LV systolic function trending to 08% in 2019 with subsequent reduction in EF in 2021 at 40% by outside facility.  He established care with our office in 2022 with echo at that time demonstrating an EF of 40 to 45% with severely dilated left atrium, moderately dilated right atrium, mild mitral regurgitation, and mild aortic valve sclerosis without evidence of stenosis.  Cardiac cath was discussed, though patient was hesitant to move forward.  Echo 08/27/2021 showed an EF of 45 to 50% with no regional wall motion abnormalities, mild mitral regurgitation, and an estimated right atrial pressure of 3 mmHg.  He was last seen in the office in 12/2021 noting progressive lower extremity swelling with recommendation to escalate diuresis briefly.  He continued to decline cardiac cath for further evaluation  of his persistent cardiomyopathy.  Follow-up was recommended in 1 month.  Echo from 08/2022 showed an EF of 35 to 40%, normal RV systolic function with mildly enlarged ventricular cavity size, moderately elevated PASP estimated at 48.9 mmHg, mild to moderate mitral regurgitation, and aortic valve sclerosis without evidence of stenosis.  He was admitted in 02/2023 with worsening wounds on the right leg as well as back pain. He was noted to have multilevel compression fractures and underwent kyphoplasty 02/28/2023 due to refractory pain.  Postoperatively developed RVR in the setting of permanent A-fib with improved ventricular rates with medical therapy.  It was noted he has stopped taking apixaban as an outpatient secondary to bruising, though was agreeable to restarting this prior to discharge.  Echo showed an EF of 35 to 40%, global hypokinesis, normal RV systolic function with mildly enlarged ventricular cavity size, moderately elevated RVSP estimated at 45.4 mmHg, severely dilated left atrium, mildly dilated right atrium, moderate tricuspid regurgitation, aortic valve sclerosis without evidence of stenosis, and an estimated right atrial pressure of 3 mmHg.  Troponin was mildly elevated and flat trending peaking at 24.  We received notification from his neurosurgeons office that the patient reported intolerance to apixaban and requested to go back on clopidogrel.  In this setting, he was referred to EP for consideration of Watchman, which was discussed during prior admission.  He comes in accompanied by his wife today.  He is without symptoms of angina or cardiac decompensation.  He continues to note longstanding exertional fatigue and dyspnea.  He also  notes a greater than 1 year history of pinpoint left-sided chest discomfort that will last for 3 to 4 minutes and spontaneously resolve.  He is no longer on apixaban secondary to bruising, last dose on 03/14/2023.Erik [name]  Living facility currently has him taking aspirin  and clopidogrel.  He continues to have significant reservations regarding OAC.  He is scheduled to see EP for discussion of possible Watchman in September.  Due to back pain from his compression fracture, unable to stand on scale for weight today.  He is somnolent in the office visit today and reports he did not sleep well last night.  He reports some irritation along the opening of his urethra.   Labs independently reviewed: 02/2023 - potassium 4.7, BUN 25, serum creatinine 0.85, TSH normal, Hgb 14.5, PLT 340, magnesium 2.5, albumin 3.4, AST/ALT normal 08/2022 - A1c 6.3, TC 139, TG 65, HDL 37, LDL 89  Past Medical History:  Diagnosis Date   Arthritis    Asthma    Atrial fibrillation (HCC)    CHF (congestive heart failure) (HCC)    COPD (chronic obstructive pulmonary disease) (HCC)    Coronary artery disease    GERD (gastroesophageal reflux disease)    Hypertension    Myocardial infarction (HCC)    Shortness of breath dyspnea     Past Surgical History:  Procedure Laterality Date   ANGIOPLASTY  2009   CARDIAC CATHETERIZATION N/A 03/18/2015   Procedure: Left Heart Cath with Coronary Angiography;  Surgeon: Lamar Blinks, MD;  Location: [facility] INVASIVE CV LAB;  Service: Cardiovascular;  Laterality: N/A;   CHOLECYSTECTOMY  2005   CORONARY ANGIOGRAM  2009   IR KYPHO THORACIC WITH BONE BIOPSY  02/28/2023   LOWER EXTREMITY ANGIOGRAPHY Right 08/31/2022   Procedure: Lower Extremity Angiography;  Surgeon: Renford Dills, MD;  Location: [facility] INVASIVE CV LAB;  Service: Cardiovascular;  Laterality: Right;    Current Medications: Current Meds  Medication Sig   acetaminophen (TYLENOL) 325 MG tablet Take 2 tablets (650 mg total) by mouth every 6 (six) hours as needed for mild pain (or Fever >/= 101).   albuterol (VENTOLIN HFA) 108 (90 Base) MCG/ACT inhaler Inhale 1 puff into the lungs every 6 (six) hours as needed for wheezing or shortness of breath.   aspirin EC 81 MG tablet Take 1 tablet (81  mg total) by mouth daily. Swallow whole.   atorvastatin (LIPITOR) 80 MG tablet Take 1 tablet (80 mg total) by mouth daily.   clopidogrel (PLAVIX) 75 MG tablet Take 75 mg by mouth daily.   ipratropium-albuterol (DUONEB) 0.5-2.5 (3) MG/3ML SOLN USE 1 VIAL IN NEBULIZER EVERY 6 HOURS   KLONOPIN 0.5 MG tablet Take 0.5 mg by mouth daily as needed.   losartan (COZAAR) 25 MG tablet Take 0.5 tablets (12.5 mg total) by mouth daily.   metoprolol succinate (TOPROL-XL) 100 MG 24 hr tablet Take 1 tablet (100 mg total) by mouth daily. Take with or immediately following a meal.   nitroGLYCERIN (NITROSTAT) 0.4 MG SL tablet Place 1 tablet (0.4 mg total) under the tongue every 5 (five) minutes as needed for chest pain.   pantoprazole (PROTONIX) 40 MG tablet Take 1 tablet (40 mg total) by mouth daily.   polyethylene glycol (MIRALAX / GLYCOLAX) 17 g packet Take 17 g by mouth daily.   senna-docusate (SENOKOT-S) 8.6-50 MG tablet Take 2 tablets by mouth 2 (two) times daily.   tamsulosin (FLOMAX) 0.4 MG CAPS capsule Take 1 capsule (0.4 mg total) by mouth daily  after supper.   torsemide (DEMADEX) 20 MG tablet Take 1 tablet (20 mg total) by mouth daily.   traMADol (ULTRAM) 50 MG tablet Take 2 tablets (100 mg total) by mouth every 6 (six) hours as needed for severe pain.   TRELEGY ELLIPTA 100-62.5-25 MCG/ACT AEPB INHALE 1 PUFF BY MOUTH INTO LUNGS DAILY   [DISCONTINUED] dapagliflozin propanediol (FARXIGA) 10 MG TABS tablet Take 1 tablet (10 mg total) by mouth daily.   [DISCONTINUED] torsemide 40 MG TABS Take 40 mg by mouth daily.    Allergies:   Benadryl [diphenhydramine hcl], Morphine and codeine, Oxycodone, and Gabapentin (once-daily)   Social History   Socioeconomic History   Marital status: Married    Spouse name: Not on file   Number of children: Not on file   Years of education: Not on file   Highest education level: Not on file  Occupational History   Not on file  Tobacco Use   Smoking status: Former     Current packs/day: 0.00    Types: Cigarettes    Start date: 03/20/1972    Quit date: 03/20/1997    Years since quitting: 26.0   Smokeless tobacco: Never  Vaping Use   Vaping status: Never Used  Substance and Sexual Activity   Alcohol use: No   Drug use: No   Sexual activity: Not on file  Other Topics Concern   Not on file  Social History Narrative   Not on file   Social Determinants of Health   Financial Resource Strain: Low Risk  (11/04/2020)   Overall Financial Resource Strain (CARDIA)    Difficulty of Paying Living Expenses: Not hard at all  Food Insecurity: No Food Insecurity (02/16/2023)   Hunger Vital Sign    Worried About Running Out of Food in the Last Year: Never true    Ran Out of Food in the Last Year: Never true  Transportation Needs: No Transportation Needs (02/16/2023)   PRAPARE - Administrator, Civil Service (Medical): No    Lack of Transportation (Non-Medical): No  Physical Activity: Not on file  Stress: Not on file  Social Connections: Not on file     Family History:  The patient's family history includes Coronary artery disease in his father; Diabetes in his father; Hyperlipidemia in his father and mother; Hypertension in his father; Stroke in his mother.  ROS:   12-point review of systems is negative unless otherwise noted in the HPI.   EKGs/Labs/Other Studies Reviewed:    Studies reviewed were summarized above. The additional studies were reviewed today:  2D echo 03/01/2023: 1. Left ventricular ejection fraction, by estimation, is 35 to 40%. The  left ventricle has moderately decreased function. The left ventricle  demonstrates global hypokinesis. Left ventricular diastolic parameters are  indeterminate.   2. Right ventricular systolic function is normal. The right ventricular  size is mildly enlarged. There is moderately elevated pulmonary artery  systolic pressure. The estimated right ventricular systolic pressure is  45.4 mmHg.   3.  Left atrial size was severely dilated.   4. Right atrial size was mildly dilated.   5. The mitral valve is normal in structure. No evidence of mitral valve  regurgitation. No evidence of mitral stenosis.   6. Tricuspid valve regurgitation is moderate.   7. The aortic valve is normal in structure. There is mild calcification  of the aortic valve. Aortic valve regurgitation is not visualized. Aortic  valve sclerosis/calcification is present, without any evidence of aortic  stenosis.   8. The inferior vena cava is normal in size with greater than 50%  respiratory variability, suggesting right atrial pressure of 3 mmHg.  __________  2D echo 08/30/2022: 1. Left ventricular ejection fraction, by estimation, is 35 to 40%. The  left ventricle has moderately decreased function. Left ventricular  endocardial border not optimally defined to evaluate regional wall motion.  Left ventricular diastolic parameters  are indeterminate.   2. Right ventricular systolic function is normal. The right ventricular  size is mildly enlarged. There is moderately elevated pulmonary artery  systolic pressure.   3. Left atrial size was mildly dilated.   4. The mitral valve is abnormal. Mild to moderate mitral valve  regurgitation. No evidence of mitral stenosis. Moderate mitral annular  calcification.   5. The aortic valve is normal in structure. Aortic valve regurgitation is  trivial. Aortic valve sclerosis/calcification is present, without any  evidence of aortic stenosis.   6. The inferior vena cava is dilated in size with <50% respiratory  variability, suggesting right atrial pressure of 15 mmHg.  __________  2D echo 01/14/2022: 1. Left ventricular ejection fraction, by estimation, is 35 to 40%. The  left ventricle has moderately decreased function. Left ventricular  endocardial border not optimally defined to evaluate regional wall motion.  Left ventricular diastolic parameters  are indeterminate.   2.  Right ventricular systolic function is normal. The right ventricular  size is mildly enlarged. There is moderately elevated pulmonary artery  systolic pressure.   3. Left atrial size was mildly dilated.   4. The mitral valve is abnormal. Mild to moderate mitral valve  regurgitation. No evidence of mitral stenosis. Moderate mitral annular  calcification.   5. The aortic valve is normal in structure. Aortic valve regurgitation is  trivial. Aortic valve sclerosis/calcification is present, without any  evidence of aortic stenosis.   6. The inferior vena cava is dilated in size with <50% respiratory  variability, suggesting right atrial pressure of 15 mmHg.  __________  2D echo 09/30/2020: 1. Left ventricular ejection fraction, by estimation, is 40 to 45%. The  left ventricle has mild to moderately decreased function. Left ventricular  endocardial border not optimally defined to evaluate regional wall motion.  Left ventricular diastolic  parameters are indeterminate.   2. Right ventricular systolic function is low normal. The right  ventricular size is normal.   3. Left atrial size was severely dilated.   4. Right atrial size was moderately dilated.   5. The mitral valve is degenerative. Mild mitral valve regurgitation.   6. The aortic valve was not well visualized. Aortic valve regurgitation  is not visualized. Mild aortic valve sclerosis is present, with no  evidence of aortic valve stenosis.   7. The inferior vena cava is normal in size with greater than 50%  respiratory variability, suggesting right atrial pressure of 3 mmHg.   Comparison(s): Prior Echo done in [place], [place]  EF 49%.  __________  See CV studies for more remote studies  EKG:  EKG is ordered today.  The EKG ordered today demonstrates Afib, 96 bpm with rare PVCs vs aberrancy, poor R wave progression along the precordial leads, baseline artifact, no acute st/t changes  Recent Labs: 02/16/2023: ALT 11; B Natriuretic  Peptide 332.0 02/28/2023: Magnesium 2.5 03/01/2023: Hemoglobin 14.5; Platelets 340; TSH 1.070 03/02/2023: BUN 25; Creatinine, Ser 0.85; Potassium 4.7; Sodium 130  Recent Lipid Panel    Component Value Date/Time   CHOL 139 09/03/2022 0614   CHOL 184  02/19/2020 0000   CHOL 146 07/18/2013 0934   TRIG 65 09/03/2022 0614   TRIG 53 07/18/2013 0934   HDL 37 (L) 09/03/2022 0614   HDL 49 02/19/2020 0000   HDL 48 07/18/2013 0934   CHOLHDL 3.8 09/03/2022 0614   VLDL 13 09/03/2022 0614   VLDL 11 07/18/2013 0934   LDLCALC 89 09/03/2022 0614   LDLCALC 115 (H) 02/19/2020 0000   LDLCALC 87 07/18/2013 0934   LDLDIRECT 140.3 (H) 11/02/2020 1649    PHYSICAL EXAM:    VS:  BP 90/64 (BP Location: Left Arm, Patient Position: Sitting, Cuff Size: Large)   Pulse 96   SpO2 95%   BMI: There is no height or weight on file to calculate BMI.  Physical Exam Vitals reviewed.  Constitutional:      Appearance: He is well-developed.     Comments: Chronically ill-appearing.  HENT:     Head: Normocephalic and atraumatic.  Eyes:     General:        Right eye: No discharge.        Left eye: No discharge.  Neck:     Vascular: No JVD.  Cardiovascular:     Rate and Rhythm: Normal rate. Rhythm irregularly irregular.     Heart sounds: Normal heart sounds, S1 normal and S2 normal. Heart sounds not distant. No midsystolic click and no opening snap. No murmur heard.    No friction rub.  Pulmonary:     Effort: Pulmonary effort is normal. No respiratory distress.     Breath sounds: Decreased breath sounds present. No wheezing or rales.     Comments: Supplemental oxygen via nasal cannula noted. Chest:     Chest wall: No tenderness.  Abdominal:     General: There is no distension.     Palpations: Abdomen is soft.     Tenderness: There is no abdominal tenderness.     Comments: Ecchymosis noted along the abdomen.  Musculoskeletal:     Cervical back: Normal range of motion.     Left lower leg: No edema.      Comments: Right lower extremity wrapped.  Skin:    General: Skin is warm and dry.     Nails: There is no clubbing.     Comments: Bruising noted along the bilateral forearms.  Neurological:     Mental Status: He is alert and oriented to person, place, and time.  Psychiatric:        Speech: Speech normal.        Behavior: Behavior normal.        Thought Content: Thought content normal.        Judgment: Judgment normal.     Wt Readings from Last 3 Encounters:  02/28/23 222 lb 0.1 oz (100.7 kg)  09/08/22 245 lb 6 oz (111.3 kg)  07/26/22 230 lb (104.3 kg)     ASSESSMENT & PLAN:   CAD involving the native coronary arteries with stable angina: Currently without symptoms of angina or cardiac decompensation.  We have had discussions with him dating back to 2022 for further evaluation of his cardiomyopathy, though he has been hesitant to move forward with cardiac cath.  After discussion today of risks and benefits, he is agreeable to proceed with [facility] to evaluate for progressive CAD and for further evaluation of his cardiomyopathy.  He remains on DAPT with aspirin and clopidogrel.  If he were agreeable to [facility], in an effort to mitigate bleeding risk, would discontinue antiplatelet therapy and maintain patient  on anticoagulation.  He remains on atorvastatin.  HFrEF secondary to ICM: Volume status is difficult to assess on physical exam.  Lungs are clear to auscultation bilaterally with mildly diminished breath sounds.  No significant lower extremity swelling.  JVD difficult to assess secondary to body habitus and facial hair.  With noted relative hypotension, reduce torsemide to 20 mg daily.  With noted urethral irritation, discontinue Marcelline Deist with recommendation for patient to follow-up with physician at a living facility for further evaluation of possible UTI.  Continue losartan 12.5 mg daily and Toprol-XL 100 mg daily.  Pursue R/LHC for further evaluation of progressive cardiomyopathy as outlined  above.  Check CBC and BMP.  Permanent A-fib: Dates back to 2012.  Ventricular rates well-controlled on Toprol-XL 100 mg daily.  CHA2DS2-VASc at least 4 (CHF, HTN, age x 1, vascular disease).  Historically, has declined OAC due to concern for bruising.  He did agree to recently rechallenge apixaban during recent admission, though has discontinued this secondary to bruising.  He is unsure if he would like to rechallenge apixaban or alternative anticoagulation at this time.  He is aware that DAPT is not adequate treatment for CVA risk reduction and accepts this risk.  He has been referred to EP for discussion of watchman and given his concerns for anticoagulation in an effort to further mitigate stroke risk.  Unlikely to be a rhythm control candidate given the longstanding A-fib dating back to 2012, and in the context of obesity and significant pulmonary disease.  HTN: Blood pressure is on the low side today.  Decrease torsemide with discontinuation of Marcelline Deist as outlined above.  Check BMP and CBC.  HLD: LDL 89 in 08/2022 with goal being at least less than 70.  Remains on atorvastatin 80 mg.  Recommend follow-up fasting labs with recommendation to escalate lipid-lowering therapy as indicated to achieve target LDL.  PAD: DAPT per vascular surgery.  Follow-up as directed.  Chronic hypoxic respiratory failure with COPD: Appears stable.  Follow-up with PCP/pulmonology as directed.   Informed Consent   Shared Decision Making/Informed Consent{  The risks [stroke (1 in 1000), death (1 in 1000), kidney failure [usually temporary] (1 in 500), bleeding (1 in 200), allergic reaction [possibly serious] (1 in 200)], benefits (diagnostic support and management of coronary artery disease) and alternatives of a cardiac catheterization were discussed in detail with Mr. Tamminga and he is willing to proceed.        Disposition: F/u with Dr. Kirke Corin after cath.   Medication Adjustments/Labs and Tests Ordered: Current  medicines are reviewed at length with the patient today.  Concerns regarding medicines are outlined above. Medication changes, Labs and Tests ordered today are summarized above and listed in the Patient Instructions accessible in Encounters.   Signed, Eula Listen, PA-C 03/28/2023 5:09 PM     [facility] [place] [address] [place], [place] [address] [phone]

## 2023-03-28 ENCOUNTER — Ambulatory Visit: Payer: HMO | Attending: Physician Assistant | Admitting: Physician Assistant

## 2023-03-28 ENCOUNTER — Encounter: Payer: Self-pay | Admitting: Physician Assistant

## 2023-03-28 VITALS — BP 90/64 | HR 96

## 2023-03-28 DIAGNOSIS — I25118 Atherosclerotic heart disease of native coronary artery with other forms of angina pectoris: Secondary | ICD-10-CM | POA: Diagnosis not present

## 2023-03-28 DIAGNOSIS — I10 Essential (primary) hypertension: Secondary | ICD-10-CM

## 2023-03-28 DIAGNOSIS — R278 Other lack of coordination: Secondary | ICD-10-CM | POA: Diagnosis not present

## 2023-03-28 DIAGNOSIS — E785 Hyperlipidemia, unspecified: Secondary | ICD-10-CM | POA: Diagnosis not present

## 2023-03-28 DIAGNOSIS — I5022 Chronic systolic (congestive) heart failure: Secondary | ICD-10-CM | POA: Diagnosis not present

## 2023-03-28 DIAGNOSIS — J449 Chronic obstructive pulmonary disease, unspecified: Secondary | ICD-10-CM

## 2023-03-28 DIAGNOSIS — I255 Ischemic cardiomyopathy: Secondary | ICD-10-CM | POA: Diagnosis not present

## 2023-03-28 DIAGNOSIS — J9611 Chronic respiratory failure with hypoxia: Secondary | ICD-10-CM

## 2023-03-28 DIAGNOSIS — I480 Paroxysmal atrial fibrillation: Secondary | ICD-10-CM | POA: Diagnosis not present

## 2023-03-28 DIAGNOSIS — S22080D Wedge compression fracture of T11-T12 vertebra, subsequent encounter for fracture with routine healing: Secondary | ICD-10-CM | POA: Diagnosis not present

## 2023-03-28 DIAGNOSIS — S32030D Wedge compression fracture of third lumbar vertebra, subsequent encounter for fracture with routine healing: Secondary | ICD-10-CM | POA: Diagnosis not present

## 2023-03-28 DIAGNOSIS — I739 Peripheral vascular disease, unspecified: Secondary | ICD-10-CM

## 2023-03-28 DIAGNOSIS — I4821 Permanent atrial fibrillation: Secondary | ICD-10-CM | POA: Diagnosis not present

## 2023-03-28 DIAGNOSIS — M6281 Muscle weakness (generalized): Secondary | ICD-10-CM | POA: Diagnosis not present

## 2023-03-28 DIAGNOSIS — Z4789 Encounter for other orthopedic aftercare: Secondary | ICD-10-CM | POA: Diagnosis not present

## 2023-03-28 DIAGNOSIS — S32050D Wedge compression fracture of fifth lumbar vertebra, subsequent encounter for fracture with routine healing: Secondary | ICD-10-CM | POA: Diagnosis not present

## 2023-03-28 LAB — BASIC METABOLIC PANEL
BUN/Creatinine Ratio: 13 (ref 10–24)
BUN: 11 mg/dL (ref 8–27)
CO2: 37 mmol/L — ABNORMAL HIGH (ref 20–29)
Calcium: 9 mg/dL (ref 8.6–10.2)
Chloride: 94 mmol/L — ABNORMAL LOW (ref 96–106)
Creatinine, Ser: 0.83 mg/dL (ref 0.76–1.27)
Glucose: 93 mg/dL (ref 70–99)
Potassium: 3.7 mmol/L (ref 3.5–5.2)
Sodium: 141 mmol/L (ref 134–144)
eGFR: 95 mL/min/{1.73_m2} (ref >59)

## 2023-03-28 LAB — CBC
Hematocrit: 39.4 % (ref 37.5–51.0)
Hemoglobin: 12.4 g/dL — ABNORMAL LOW (ref 13.0–17.7)
MCH: 28 pg (ref 26.6–33.0)
MCHC: 31.5 g/dL (ref 31.5–35.7)
MCV: 89 fL (ref 79–97)
Platelets: 464 10*3/uL — ABNORMAL HIGH (ref 150–450)
RBC: 4.43 x10E6/uL (ref 4.14–5.80)
RDW: 12.9 % (ref 11.6–15.4)
WBC: 12.3 10*3/uL — ABNORMAL HIGH (ref 3.4–10.8)

## 2023-03-28 MED ORDER — TORSEMIDE 20 MG PO TABS: 20.0000 mg | ORAL_TABLET | Freq: Every day | ORAL | 3 refills | Status: DC

## 2023-03-28 NOTE — Patient Instructions (Addendum)
Medication Instructions:  Your physician recommends the following medication changes.  STOP TAKING: Farxiga   DECREASE: Torsemide 20 mg once daily    *If you need a refill on your cardiac medications before your next appointment, please call your pharmacy*   Lab Work: Your provider would like for you to have following labs drawn today cbc, bmp.   If you have labs (blood work) drawn today and your tests are completely normal, you will receive your results only by: MyChart Message (if you have MyChart) OR A paper copy in the mail If you have any lab test that is abnormal or we need to change your treatment, we will call you to review the results.   Testing/Procedures:  [place] [facility] DEPT OF [facility] [place] [address] [name], [address] [place] [address] Dept: [phone] Loc: [phone]  Erik [name]  03/28/2023  You are scheduled for a Cardiac Catheterization on Thursday, August 29 with Dr. Lorine Bears.  1. Please arrive at the [facility]ascular [facility] Entrance of [facility], [address], [place] [address] at 10:30 AM (This is 1 hour(s) prior to your procedure time).  Proceed to the Check-In Desk directly inside the entrance.  Procedure Parking: Use the entrance off of the [address] side of the hospital. Turn right upon entering and follow the driveway to parking that is directly in front of the Heart & Vascular Center. There is no valet parking available at this entrance, however there is an awning directly in front of the Heart & Vascular Center for drop off/ pick up for patients.  Special note: Every effort is made to have your procedure done on time. Please understand that emergencies sometimes delay scheduled procedures.  2. Diet: Do not eat solid foods after midnight.  The patient may have clear liquids until 5am upon the day of the procedure.   4. Medication instructions in  preparation for your procedure:   Contrast Allergy: No   Hold Torsemide morning of the procedure   On the morning of your procedure, take your Aspirin 81 mg and Plavix/Clopidogrel and any morning medicines NOT listed above.  You may use sips of water.  5. Plan to go home the same day, you will only stay overnight if medically necessary. 6. Bring a current list of your medications and current insurance cards. 7. You MUST have a responsible person to drive you home. 8. Someone MUST be with you the first 24 hours after you arrive home or your discharge will be delayed. 9. Please wear clothes that are easy to get on and off and wear slip-on shoes.  Thank you for allowing [place] to care for you!   -- [facility]    Follow-Up: At [facility], you and your health needs are our priority.  As part of our continuing mission to provide you with exceptional heart care, we have created designated Provider Care Teams.  These Care Teams include your primary Cardiologist (physician) and Advanced Practice Providers (APPs -  Physician Assistants and Nurse Practitioners) who all work together to provide you with the care you need, when you need it.  We recommend signing up for the patient portal called "MyChart".  Sign up information is provided on this After Visit Summary.  MyChart is used to connect with patients for Virtual Visits (Telemedicine).  Patients are able to view lab/test results, encounter notes, upcoming appointments, etc.  Non-urgent  messages can be sent to your provider as well.   To learn more about what you can do with MyChart, go to [email or web address].    Your next appointment:   2-3 weeks   Provider:   Lorine Bears, MD

## 2023-03-29 ENCOUNTER — Other Ambulatory Visit (INDEPENDENT_AMBULATORY_CARE_PROVIDER_SITE_OTHER): Payer: Self-pay | Admitting: Nurse Practitioner

## 2023-03-29 ENCOUNTER — Telehealth: Payer: Self-pay | Admitting: Cardiovascular Disease

## 2023-03-29 DIAGNOSIS — Z9889 Other specified postprocedural states: Secondary | ICD-10-CM

## 2023-03-29 NOTE — Telephone Encounter (Signed)
Left message to call back  

## 2023-03-29 NOTE — Telephone Encounter (Signed)
Spoke with patient's wife and she stated that PA-C Eula Listen told him to stop taking tamsulosin (FLOMAX) 0.4 MG CAPS capsule. Informed patient that according to PA-C Ryan Dunn's note he was to "With noted urethral irritation, discontinue Marcelline Deist with recommendation for patient to follow-up with physician at a living facility for further evaluation of possible UTI." Patient's spouse stated that "They don't believe nothing I tell them, so you have to tell them." Instructed patient to get living facility's fax number and we could fax last office note to them that shows the instructions. Patient's spouse stated that she would get the fax number and call back with it the following day

## 2023-03-29 NOTE — Telephone Encounter (Signed)
Pt c/o medication issue:  1. Name of Medication:   tamsulosin (FLOMAX) 0.4 MG CAPS capsule   2. How are you currently taking this medication (dosage and times per day)?   3. Are you having a reaction (difficulty breathing--STAT)?   4. What is your medication issue?   Wife stated at patient visit yesterday they discussed patient's issue with irritation along athe opening of his urethra.  Wife thinks it may be related to this medication.  Wife stated patient was supposed to be taken of this medication and wants confirmation.

## 2023-03-30 ENCOUNTER — Ambulatory Visit (INDEPENDENT_AMBULATORY_CARE_PROVIDER_SITE_OTHER): Payer: HMO

## 2023-03-30 ENCOUNTER — Ambulatory Visit (INDEPENDENT_AMBULATORY_CARE_PROVIDER_SITE_OTHER): Payer: HMO | Admitting: Nurse Practitioner

## 2023-03-30 ENCOUNTER — Encounter (INDEPENDENT_AMBULATORY_CARE_PROVIDER_SITE_OTHER): Payer: Self-pay | Admitting: Nurse Practitioner

## 2023-03-30 VITALS — Resp 20 | Ht 69.0 in | Wt 222.0 lb

## 2023-03-30 DIAGNOSIS — Z9889 Other specified postprocedural states: Secondary | ICD-10-CM | POA: Diagnosis not present

## 2023-03-30 DIAGNOSIS — I739 Peripheral vascular disease, unspecified: Secondary | ICD-10-CM

## 2023-03-30 DIAGNOSIS — I10 Essential (primary) hypertension: Secondary | ICD-10-CM | POA: Diagnosis not present

## 2023-03-30 DIAGNOSIS — M7989 Other specified soft tissue disorders: Secondary | ICD-10-CM | POA: Diagnosis not present

## 2023-03-30 NOTE — Telephone Encounter (Signed)
Wife called to report the fax number to [facility] - fax# [phone].

## 2023-03-30 NOTE — Telephone Encounter (Signed)
Called and spoke with wife. Informed her that we received the number for [facility] and that I would fax over the most recent office note, per patient request. Wife verbalizes understanding.

## 2023-03-30 NOTE — Progress Notes (Signed)
Subjective:    Patient ID: Erik [name], male    DOB: Jul 28, 1956, 67 y.o.   MRN: [id number] Chief Complaint  Patient presents with   Follow-up    f/u in 3 months with abi    [name] returns today for follow-up of noninvasive studies.  He has been advised partner.  He is currently in a rehab facility but notes that he should be getting it soon.  He has a wound on his right lower extremity which he will be coordinate with the wound center.  He is partly also concerned regarding his medications.  Tramadol and gabapentin.  He notes that the wound has been healing well per his facility.  He has not had issues with lower extremity edema but they are actually fairly well-controlled.  Today noninvasive studies show an ABI of 1.18 on the right and 1.33 on the left.  Previous study shows 1.07 on the right and 1.27 on the left.  He has triphasic tibial artery waveforms on the right and triphasic/biphasic in the left.    Review of Systems  Musculoskeletal:  Positive for back pain and gait problem.  Neurological:  Positive for weakness.  All other systems reviewed and are negative.      Objective:   Physical Exam Vitals reviewed.  HENT:     Head: Normocephalic.  Cardiovascular:     Rate and Rhythm: Normal rate.  Pulmonary:     Effort: Pulmonary effort is normal.  Skin:    General: Skin is warm and dry.  Neurological:     Mental Status: He is alert and oriented to person, place, and time.     Gait: Gait abnormal.  Psychiatric:        Mood and Affect: Mood normal.        Behavior: Behavior normal.        Thought Content: Thought content normal.        Judgment: Judgment normal.     Resp 20   Ht 5\' 9"  (1.753 m)   Wt 222 lb (100.7 kg)   BMI 32.78 kg/m   Past Medical History:  Diagnosis Date   Arthritis    Asthma    Atrial fibrillation (HCC)    CHF (congestive heart failure) (HCC)    COPD (chronic obstructive pulmonary disease) (HCC)    Coronary artery disease     GERD (gastroesophageal reflux disease)    Hypertension    Myocardial infarction (HCC)    Shortness of breath dyspnea     Social History   Socioeconomic History   Marital status: Married    Spouse name: Not on file   Number of children: Not on file   Years of education: Not on file   Highest education level: Not on file  Occupational History   Not on file  Tobacco Use   Smoking status: Former    Current packs/day: 0.00    Types: Cigarettes    Start date: 03/20/1972    Quit date: 03/20/1997    Years since quitting: 26.0   Smokeless tobacco: Never  Vaping Use   Vaping status: Never Used  Substance and Sexual Activity   Alcohol use: No   Drug use: No   Sexual activity: Not on file  Other Topics Concern   Not on file  Social History Narrative   Not on file   Social Determinants of Health   Financial Resource Strain: Low Risk  (11/04/2020)   Overall Financial Resource Strain (CARDIA)  Difficulty of Paying Living Expenses: Not hard at all  Food Insecurity: No Food Insecurity (02/16/2023)   Hunger Vital Sign    Worried About Running Out of Food in the Last Year: Never true    Ran Out of Food in the Last Year: Never true  Transportation Needs: No Transportation Needs (02/16/2023)   PRAPARE - Administrator, Civil Service (Medical): No    Lack of Transportation (Non-Medical): No  Physical Activity: Not on file  Stress: Not on file  Social Connections: Not on file  Intimate Partner Violence: Not At Risk (02/16/2023)   Humiliation, Afraid, Rape, and Kick questionnaire    Fear of Current or Ex-Partner: No    Emotionally Abused: No    Physically Abused: No    Sexually Abused: No    Past Surgical History:  Procedure Laterality Date   ANGIOPLASTY  2009   CARDIAC CATHETERIZATION N/A 03/18/2015   Procedure: Left Heart Cath with Coronary Angiography;  Surgeon: Lamar Blinks, MD;  Location: [facility] INVASIVE CV LAB;  Service: Cardiovascular;  Laterality: N/A;    CHOLECYSTECTOMY  2005   CORONARY ANGIOGRAM  2009   IR KYPHO THORACIC WITH BONE BIOPSY  02/28/2023   LOWER EXTREMITY ANGIOGRAPHY Right 08/31/2022   Procedure: Lower Extremity Angiography;  Surgeon: Renford Dills, MD;  Location: [facility] INVASIVE CV LAB;  Service: Cardiovascular;  Laterality: Right;    Family History  Problem Relation Age of Onset   Coronary artery disease Father    Diabetes Father    Hyperlipidemia Father    Hypertension Father    Stroke Mother    Hyperlipidemia Mother     Allergies  Allergen Reactions   Benadryl [Diphenhydramine Hcl] Shortness Of Breath   Morphine And Codeine Swelling    Difficulty breathing per patient    Oxycodone Swelling    Severe mouth swelling requiring medical intervention   Gabapentin (Once-Daily)        Latest Ref Rng & Units 03/28/2023    4:10 PM 03/01/2023    5:04 AM 02/28/2023    4:28 AM  CBC  WBC 3.4 - 10.8 x10E3/uL 12.3  19.8  20.4   Hemoglobin 13.0 - 17.7 g/dL 98.1  19.1  47.8   Hematocrit 37.5 - 51.0 % 39.4  47.4  45.2   Platelets 150 - 450 x10E3/uL 464  340  352       CMP     Component Value Date/Time   NA 141 03/28/2023 1610   NA 135 (L) 07/19/2013 0417   K 3.7 03/28/2023 1610   K 3.9 07/19/2013 0417   CL 94 (L) 03/28/2023 1610   CL 101 07/19/2013 0417   CO2 37 (H) 03/28/2023 1610   CO2 29 07/19/2013 0417   GLUCOSE 93 03/28/2023 1610   GLUCOSE 88 03/02/2023 0749   GLUCOSE 86 07/19/2013 0417   BUN 11 03/28/2023 1610   BUN 10 07/19/2013 0417   CREATININE 0.83 03/28/2023 1610   CREATININE 0.80 07/19/2013 0417   CALCIUM 9.0 03/28/2023 1610   CALCIUM 8.4 (L) 07/19/2013 0417   PROT 6.8 02/16/2023 0943   PROT 6.3 02/19/2020 0000   PROT 6.6 07/19/2013 0417   ALBUMIN 3.4 (L) 02/16/2023 0943   ALBUMIN 4.2 02/19/2020 0000   ALBUMIN 2.8 (L) 07/19/2013 0417   AST 22 02/16/2023 0943   AST 33 07/19/2013 0417   ALT 11 02/16/2023 0943   ALT 32 07/19/2013 0417   ALKPHOS 100 02/16/2023 0943   ALKPHOS 85 07/19/2013  0417   BILITOT 1.1 02/16/2023 0943   BILITOT 0.4 02/19/2020 0000   BILITOT 1.6 (H) 07/19/2013 0417   EGFR 95 03/28/2023 1610   GFRNONAA >60 03/02/2023 0749   GFRNONAA >60 07/19/2013 0417     No results found.     Assessment & Plan:   1. Peripheral arterial disease with history of revascularization (HCC) Today the patient's noninvasive studies were stable.  He should have adequate perfusion for wound healing.  Per the request of the patient and his partner we have DC the tramadol and gabapentin.  If the patient for any disease in the future they will need to discuss with his PCP for guidance  2. Essential hypertension with goal blood pressure less than 130/80 Continue antihypertensive medications as already ordered, these medications have been reviewed and there are no changes at this time.  3. Leg swelling The patient's lower extremity edema is much better controlled.  Previous office visit.  He is advised to utilize medical grade compression if it becomes uncontrolled once again in addition to elevation and activity.   Current Outpatient Medications on File Prior to Visit  Medication Sig Dispense Refill   acetaminophen (TYLENOL) 325 MG tablet Take 2 tablets (650 mg total) by mouth every 6 (six) hours as needed for mild pain (or Fever >/= 101).     albuterol (VENTOLIN HFA) 108 (90 Base) MCG/ACT inhaler Inhale 1 puff into the lungs every 6 (six) hours as needed for wheezing or shortness of breath. 18 g 1   aspirin EC 81 MG tablet Take 1 tablet (81 mg total) by mouth daily. Swallow whole. 30 tablet 12   atorvastatin (LIPITOR) 80 MG tablet Take 1 tablet (80 mg total) by mouth daily. 30 tablet 1   clopidogrel (PLAVIX) 75 MG tablet Take 75 mg by mouth daily.     gabapentin (NEURONTIN) 100 MG capsule Take 100 mg by mouth 3 (three) times daily.     ipratropium-albuterol (DUONEB) 0.5-2.5 (3) MG/3ML SOLN USE 1 VIAL IN NEBULIZER EVERY 6 HOURS 120 mL 0   KLONOPIN 0.5 MG tablet Take 0.5 mg by  mouth daily as needed.     losartan (COZAAR) 25 MG tablet Take 0.5 tablets (12.5 mg total) by mouth daily. 30 tablet 1   metoprolol succinate (TOPROL-XL) 100 MG 24 hr tablet Take 1 tablet (100 mg total) by mouth daily. Take with or immediately following a meal. 30 tablet 1   nitroGLYCERIN (NITROSTAT) 0.4 MG SL tablet Place 1 tablet (0.4 mg total) under the tongue every 5 (five) minutes as needed for chest pain. 30 tablet 12   pantoprazole (PROTONIX) 40 MG tablet Take 1 tablet (40 mg total) by mouth daily. 30 tablet 1   polyethylene glycol (MIRALAX / GLYCOLAX) 17 g packet Take 17 g by mouth daily.     senna-docusate (SENOKOT-S) 8.6-50 MG tablet Take 2 tablets by mouth 2 (two) times daily. 60 tablet 0   tamsulosin (FLOMAX) 0.4 MG CAPS capsule Take 1 capsule (0.4 mg total) by mouth daily after supper. 30 capsule 1   torsemide (DEMADEX) 20 MG tablet Take 1 tablet (20 mg total) by mouth daily. 180 tablet 3   traMADol (ULTRAM) 50 MG tablet Take 2 tablets (100 mg total) by mouth every 6 (six) hours as needed for severe pain. 10 tablet 0   TRELEGY ELLIPTA 100-62.5-25 MCG/ACT AEPB INHALE 1 PUFF BY MOUTH INTO LUNGS DAILY 60 each 3   apixaban (ELIQUIS) 5 MG TABS tablet Take 1 tablet (5  mg total) by mouth 2 (two) times daily. (Patient not taking: Reported on 03/20/2023) 60 tablet 1   No current facility-administered medications on file prior to visit.    There are no Patient Instructions on file for this visit. No follow-ups on file.   Georgiana Spinner, NP

## 2023-03-31 DIAGNOSIS — M6281 Muscle weakness (generalized): Secondary | ICD-10-CM | POA: Diagnosis not present

## 2023-03-31 DIAGNOSIS — S32050D Wedge compression fracture of fifth lumbar vertebra, subsequent encounter for fracture with routine healing: Secondary | ICD-10-CM | POA: Diagnosis not present

## 2023-03-31 DIAGNOSIS — R278 Other lack of coordination: Secondary | ICD-10-CM | POA: Diagnosis not present

## 2023-03-31 DIAGNOSIS — I480 Paroxysmal atrial fibrillation: Secondary | ICD-10-CM | POA: Diagnosis not present

## 2023-03-31 DIAGNOSIS — S32030D Wedge compression fracture of third lumbar vertebra, subsequent encounter for fracture with routine healing: Secondary | ICD-10-CM | POA: Diagnosis not present

## 2023-03-31 DIAGNOSIS — Z4789 Encounter for other orthopedic aftercare: Secondary | ICD-10-CM | POA: Diagnosis not present

## 2023-03-31 DIAGNOSIS — S22080D Wedge compression fracture of T11-T12 vertebra, subsequent encounter for fracture with routine healing: Secondary | ICD-10-CM | POA: Diagnosis not present

## 2023-03-31 DIAGNOSIS — I5022 Chronic systolic (congestive) heart failure: Secondary | ICD-10-CM | POA: Diagnosis not present

## 2023-03-31 DIAGNOSIS — J449 Chronic obstructive pulmonary disease, unspecified: Secondary | ICD-10-CM | POA: Diagnosis not present

## 2023-03-31 DIAGNOSIS — J9611 Chronic respiratory failure with hypoxia: Secondary | ICD-10-CM | POA: Diagnosis not present

## 2023-03-31 LAB — VAS US ABI WITH/WO TBI
Left ABI: 1.33
Right ABI: 1.18

## 2023-04-03 ENCOUNTER — Telehealth: Payer: Self-pay | Admitting: Cardiovascular Disease

## 2023-04-03 DIAGNOSIS — I255 Ischemic cardiomyopathy: Secondary | ICD-10-CM

## 2023-04-03 NOTE — Telephone Encounter (Signed)
Patient's wife would like to know if the patient needs to prepare to stay overnight for 8/29 procedure with Dr. Kirke Corin. She states patient has a lot of other issues and she assumes he may need to stay rather than going home immediately.

## 2023-04-03 NOTE — Telephone Encounter (Signed)
Called patient, advised that they can plan for a stay- to be prepared, but it would depend on the procedure. Most patients get to go home, so it would depend on how the procedure goes and what happens. Patient wife verbalized understanding, thankful for callback.  She stated he stayed last time he had this done, and just wanted to check on this again.

## 2023-04-04 ENCOUNTER — Telehealth: Payer: Self-pay | Admitting: Cardiovascular Disease

## 2023-04-04 DIAGNOSIS — Z515 Encounter for palliative care: Secondary | ICD-10-CM | POA: Diagnosis not present

## 2023-04-04 DIAGNOSIS — L988 Other specified disorders of the skin and subcutaneous tissue: Secondary | ICD-10-CM | POA: Diagnosis not present

## 2023-04-04 DIAGNOSIS — I83018 Varicose veins of right lower extremity with ulcer other part of lower leg: Secondary | ICD-10-CM | POA: Diagnosis not present

## 2023-04-04 NOTE — Telephone Encounter (Addendum)
Called and spoke with Graciella Belton- the appointment coordinator at [facility].  They are needing information faxed to them in regards to the patient's upcoming heart cath scheduled for 04/06/23.  Fax # confirmed- [phone].  Office note from 03/28/23 with Eula Listen, PA outlining the details of the patient's heart cath were faxed via [facility] fax function to [phone].

## 2023-04-04 NOTE — Telephone Encounter (Signed)
I called and spoke with the patient's wife, [name] (ok per DPR).  She is aware that I have faxed the patient's cath details to [facility] at [facility].  I have re-confirmed the arrival time of 10:30 am on 04/06/23 with [name] as well. She voices understanding and was appreciative for the call back.

## 2023-04-04 NOTE — Telephone Encounter (Signed)
Patient's wife is calling because the patient is in [facility] center. The rehab center is needing the information about the upcoming cath procedure fax to them. Patient's wife states they need the appointment information and the prep instructions for the procedure. Please advise.

## 2023-04-05 ENCOUNTER — Telehealth: Payer: Self-pay | Admitting: Cardiovascular Disease

## 2023-04-05 ENCOUNTER — Other Ambulatory Visit: Payer: Self-pay | Admitting: *Deleted

## 2023-04-05 DIAGNOSIS — I480 Paroxysmal atrial fibrillation: Secondary | ICD-10-CM | POA: Diagnosis not present

## 2023-04-05 DIAGNOSIS — I10 Essential (primary) hypertension: Secondary | ICD-10-CM | POA: Diagnosis not present

## 2023-04-05 DIAGNOSIS — F413 Other mixed anxiety disorders: Secondary | ICD-10-CM | POA: Diagnosis not present

## 2023-04-05 DIAGNOSIS — J441 Chronic obstructive pulmonary disease with (acute) exacerbation: Secondary | ICD-10-CM | POA: Diagnosis not present

## 2023-04-05 DIAGNOSIS — L03115 Cellulitis of right lower limb: Secondary | ICD-10-CM | POA: Diagnosis not present

## 2023-04-05 DIAGNOSIS — M6281 Muscle weakness (generalized): Secondary | ICD-10-CM | POA: Diagnosis not present

## 2023-04-05 DIAGNOSIS — M5459 Other low back pain: Secondary | ICD-10-CM | POA: Diagnosis not present

## 2023-04-05 DIAGNOSIS — I5042 Chronic combined systolic (congestive) and diastolic (congestive) heart failure: Secondary | ICD-10-CM | POA: Diagnosis not present

## 2023-04-05 DIAGNOSIS — S32000A Wedge compression fracture of unspecified lumbar vertebra, initial encounter for closed fracture: Secondary | ICD-10-CM | POA: Diagnosis not present

## 2023-04-05 DIAGNOSIS — I739 Peripheral vascular disease, unspecified: Secondary | ICD-10-CM | POA: Diagnosis not present

## 2023-04-05 NOTE — Telephone Encounter (Signed)
Called and spoke with wife. Informed her that the entrance for the upcoming procedure is the Heart and Vascular Entrance. Wife verbalizes understanding.

## 2023-04-05 NOTE — Telephone Encounter (Signed)
OV note and instructions for cath faxed over to [facility] at [phone] as requested. Called and advised facility that information had been faxed.

## 2023-04-05 NOTE — Telephone Encounter (Signed)
Dianne from [employer] called back. She mentioned that they received the fax yesterday, but unfortunately, they were unable to locate it today, as it may have been misplaced by one of the nurses. She kindly requests if it could be resent today to Fax [phone]. They need to know where the patient should be dropped off and what medications the patient will need to hold for tomorrow's procedure

## 2023-04-05 NOTE — Telephone Encounter (Signed)
Patient's wife is calling to get information on where she should meet patient in order to transport him from the facility vehicle into the building. She states she has called [facility] and they have given her conflicting information. Please advise.

## 2023-04-06 ENCOUNTER — Encounter: Payer: Self-pay | Admitting: Cardiovascular Disease

## 2023-04-06 ENCOUNTER — Other Ambulatory Visit: Payer: Self-pay | Admitting: Cardiology

## 2023-04-06 ENCOUNTER — Ambulatory Visit
Admission: RE | Admit: 2023-04-06 | Discharge: 2023-04-06 | Disposition: A | Discharge status: Skilled Nursing Facility | Payer: HMO | Attending: Cardiovascular Disease | Admitting: Cardiovascular Disease

## 2023-04-06 DIAGNOSIS — I255 Ischemic cardiomyopathy: Secondary | ICD-10-CM | POA: Insufficient documentation

## 2023-04-06 DIAGNOSIS — Z539 Procedure and treatment not carried out, unspecified reason: Secondary | ICD-10-CM | POA: Insufficient documentation

## 2023-04-06 DIAGNOSIS — F419 Anxiety disorder, unspecified: Secondary | ICD-10-CM | POA: Diagnosis not present

## 2023-04-06 DIAGNOSIS — I5022 Chronic systolic (congestive) heart failure: Secondary | ICD-10-CM

## 2023-04-06 SURGERY — RIGHT/LEFT HEART CATH AND CORONARY ANGIOGRAPHY
Anesthesia: Moderate Sedation | Laterality: Bilateral

## 2023-04-06 MED ORDER — ASPIRIN 81 MG PO CHEW: 81.0000 mg | CHEWABLE_TABLET | ORAL | Status: DC

## 2023-04-06 MED ORDER — CLONAZEPAM 0.5 MG PO TABS: 0.5000 mg | ORAL_TABLET | ORAL | 0 refills | Status: DC

## 2023-04-06 MED ORDER — METOPROLOL SUCCINATE ER 50 MG PO TB24
100.0000 mg | ORAL_TABLET | Freq: Every day | ORAL | Status: DC
  Administered 2023-04-06: 100 mg via ORAL

## 2023-04-06 MED ORDER — SODIUM CHLORIDE 0.9 % IV SOLN: INTRAVENOUS | Status: DC

## 2023-04-06 MED ORDER — METOPROLOL SUCCINATE ER 25 MG PO TB24
100.0000 mg | ORAL_TABLET | Freq: Every day | ORAL | Status: DC
Start: 2023-04-06 — End: 2023-05-28

## 2023-04-06 MED ORDER — METOPROLOL SUCCINATE ER 100 MG PO TB24: 100.0000 mg | ORAL_TABLET | Freq: Every day | ORAL | 11 refills | Status: DC

## 2023-04-06 MED ORDER — CLONAZEPAM 0.5 MG PO TBDP
0.5000 mg | ORAL_TABLET | Freq: Once | ORAL | Status: DC
Start: 2023-04-06 — End: 2023-05-28

## 2023-04-06 MED ORDER — CLONAZEPAM 0.5 MG PO TABS
0.5000 mg | ORAL_TABLET | Freq: Once | ORAL | Status: AC
  Administered 2023-04-06: 0.5 mg via ORAL
  Filled 2023-04-06: qty 1

## 2023-04-06 MED ORDER — METOPROLOL SUCCINATE ER 50 MG PO TB24
ORAL_TABLET | ORAL | Status: AC
  Filled 2023-04-06: qty 2

## 2023-04-06 NOTE — Progress Notes (Signed)
Called RN Demetrios Isaacs at [facility] with update on patient. Let her know that patient was given two medications while here and also was given lunch.

## 2023-04-06 NOTE — Progress Notes (Signed)
Patient's procedure cancelled after being assessed by Charlsie Quest NP cardiology. Patient and wife verbalize their understanding. Will return patient to [facility]. Zenaida Niece will be here at [facility]. Patient given medication for HR and anxiety. Given lunch and currently is watching TV. Call bell at bedside.

## 2023-04-07 DIAGNOSIS — I480 Paroxysmal atrial fibrillation: Secondary | ICD-10-CM | POA: Diagnosis not present

## 2023-04-07 DIAGNOSIS — Z4789 Encounter for other orthopedic aftercare: Secondary | ICD-10-CM | POA: Diagnosis not present

## 2023-04-07 DIAGNOSIS — R278 Other lack of coordination: Secondary | ICD-10-CM | POA: Diagnosis not present

## 2023-04-07 DIAGNOSIS — I5022 Chronic systolic (congestive) heart failure: Secondary | ICD-10-CM | POA: Diagnosis not present

## 2023-04-07 DIAGNOSIS — J9611 Chronic respiratory failure with hypoxia: Secondary | ICD-10-CM | POA: Diagnosis not present

## 2023-04-07 DIAGNOSIS — S32030D Wedge compression fracture of third lumbar vertebra, subsequent encounter for fracture with routine healing: Secondary | ICD-10-CM | POA: Diagnosis not present

## 2023-04-07 DIAGNOSIS — S32050D Wedge compression fracture of fifth lumbar vertebra, subsequent encounter for fracture with routine healing: Secondary | ICD-10-CM | POA: Diagnosis not present

## 2023-04-07 DIAGNOSIS — S22080D Wedge compression fracture of T11-T12 vertebra, subsequent encounter for fracture with routine healing: Secondary | ICD-10-CM | POA: Diagnosis not present

## 2023-04-07 DIAGNOSIS — J449 Chronic obstructive pulmonary disease, unspecified: Secondary | ICD-10-CM | POA: Diagnosis not present

## 2023-04-07 DIAGNOSIS — M6281 Muscle weakness (generalized): Secondary | ICD-10-CM | POA: Diagnosis not present

## 2023-04-07 NOTE — H&P (Signed)
The patient presented for an elective cardiac cath but was found to have significant discomfort in his back and arm related to a recent fall and he was tachycardic after his Toprol was held.  He was also having severe anxiety.  We felt that all these issues have to be addressed before proceeding with an elective cardiac cath.  The procedure was canceled.

## 2023-04-24 ENCOUNTER — Institutional Professional Consult (permissible substitution): Payer: HMO | Admitting: Cardiology

## 2023-04-24 ENCOUNTER — Telehealth: Payer: Self-pay | Admitting: Cardiology

## 2023-04-24 NOTE — Telephone Encounter (Signed)
Called and left a message to reschedule with dr Kirke Corin. A Thurmond Butts authorized to take a 7 day hold.

## 2023-04-24 NOTE — Telephone Encounter (Signed)
Pt called inquiring as to why he is scheduled to see Dr. Lalla Brothers. Per chart review, referral placed to discuss wtachman. Pt made aware and verbalized understanding.  Pt also reported he's been experiencing serve left sided pain since they attempted to help him on the bed prior to cath. Pt stated he is unable to apply pressure to his left side. Pt reported he had back surgery a few months ago. Nurse recommended pt contact surgeon office. Pt verbalized understanding.  Pt requested to cancel appointment scheduled with Dr. Kirke Corin tomorrow 04/26/23. Pt stated he is unable to leave his house because they are still working on his ramp.  Appointment cancelled Nurse will reach out to schedulers to reschedule.

## 2023-04-24 NOTE — Telephone Encounter (Signed)
Pt has questions after A fib. I did reschedule him for next week on the 24th

## 2023-04-25 ENCOUNTER — Ambulatory Visit: Payer: HMO | Admitting: Cardiovascular Disease

## 2023-05-01 NOTE — Progress Notes (Deleted)
Electrophysiology Office Note:    Date:  05/01/2023   ID:  Erik [name], DOB Dec 14, 1955, MRN [id number]  [facility] Cardiologist:  Lorine Bears, MD  [facility] Electrophysiologist:  None   Referring MD: Sondra Barges, PA-C   Chief Complaint: Atrial fibrillation  History of Present Illness:    Erik [name] is a 67 y.o. malewho I am seeing today for an evaluation of atrial fibrillation at the request of Dr. Kirke Corin.  The patient was last seen by Eula Listen on March 28, 2023.  The patient has a medical history that includes coronary artery disease, permanent atrial fibrillation, chronic systolic heart failure, chronic hypoxic respiratory failure on supplemental oxygen, peripheral arterial disease, hypertension, hyperlipidemia, COPD and arthritis.  There is prior documentation concerning possible medication regimen noncompliance.  He was admitted in July of this year with wounds on his right leg.  He underwent back surgery February 28, 2023 because of refractory back pain.  Postop, he developed rapid ventricular rates associated with his atrial fibrillation.  Eliquis was restarted.  He contacted his neurosurgeons office and reported intolerance to Eliquis and so he was switched back to Plavix.  Because of his intolerance to anticoagulation, he is referred to discuss left atrial appendage occlusion.        Their past medical, social and family history was reveiwed.   ROS:   Please see the history of present illness.    All other systems reviewed and are negative.  EKGs/Labs/Other Studies Reviewed:    The following studies were reviewed today:  March 02, 2023 echo EF 35-40 RV function normal Severely dilated left atrium Moderate TR        Physical Exam:    VS:  There were no vitals taken for this visit.    Wt Readings from Last 3 Encounters:  04/06/23 222 lb 0.1 oz (100.7 kg)  03/30/23 222 lb (100.7 kg)  02/28/23 222 lb 0.1 oz (100.7 kg)     GEN: *** Well  nourished, well developed in no acute distress CARDIAC: ***Irregularly irregular, no murmurs, rubs, gallops RESPIRATORY:  Clear to auscultation without rales, wheezing or rhonchi       ASSESSMENT AND PLAN:    No diagnosis found.  #Permanent atrial fibrillation #Intolerance of anticoagulation The patient has permanent atrial fibrillation and takes metoprolol for rate control.  He has severely dilated left atrium on echo.  He is currently on DAPT given an intolerance to anticoagulation.  We discussed his stroke risk during today's clinic appointment.  We discussed the role of left atrial appendage occlusion for stroke risk mitigation in those patients who have contraindications to anticoagulation.  Unfortunately, given his severe chronic respiratory failure requiring supplemental oxygen, I do not think he is an acceptable candidate to undergo left atrial appendage occlusion.  I have recommended continuing his DAPT.  We have discussed the limitations of the strategy but unfortunately there are no clear alternatives for him.  #Coronary artery disease No ischemic symptoms today.  Continue DAPT  #Chronic systolic heart failure NYHA class II-III.  Warm and dry on exam today.  Continue losartan, metoprolol, torsemide.  Follow-up with with EP on an as-needed basis.        Signed, Rossie Muskrat. Lalla Brothers, MD, [facility], [facility] 05/01/2023 10:28 AM    Electrophysiology  [facility]

## 2023-05-02 ENCOUNTER — Ambulatory Visit: Payer: HMO | Admitting: Cardiology

## 2023-05-05 ENCOUNTER — Other Ambulatory Visit (INDEPENDENT_AMBULATORY_CARE_PROVIDER_SITE_OTHER): Payer: Self-pay | Admitting: Nurse Practitioner

## 2023-05-05 DIAGNOSIS — Z9889 Other specified postprocedural states: Secondary | ICD-10-CM

## 2023-05-09 ENCOUNTER — Ambulatory Visit (INDEPENDENT_AMBULATORY_CARE_PROVIDER_SITE_OTHER): Payer: HMO | Admitting: Nurse Practitioner

## 2023-05-09 ENCOUNTER — Encounter (INDEPENDENT_AMBULATORY_CARE_PROVIDER_SITE_OTHER): Payer: HMO

## 2023-05-21 NOTE — Progress Notes (Deleted)
Electrophysiology Office Note:    Date:  05/21/2023   ID:  Erik [name], DOB 1956/04/21, MRN [id number]  [facility] Cardiologist:  Lorine Bears, MD  [facility] Electrophysiologist:  Lanier Prude, MD   Referring MD: Sondra Barges, PA-C   Chief Complaint: Atrial fibrillation  History of Present Illness:    Erik [name] is a 67 y.o. malewho I am seeing today for an evaluation of atrial fibrillation at the request of Dr. Kirke Corin.  The patient was last seen by Eula Listen on March 28, 2023.  The patient has a medical history that includes coronary artery disease, permanent atrial fibrillation, chronic systolic heart failure, chronic hypoxic respiratory failure on supplemental oxygen, peripheral arterial disease, hypertension, hyperlipidemia, COPD and arthritis.  There is prior documentation concerning possible medication regimen noncompliance.  He was admitted in July of this year with wounds on his right leg.  He underwent back surgery February 28, 2023 because of refractory back pain.  Postop, he developed rapid ventricular rates associated with his atrial fibrillation.  Eliquis was restarted.  He contacted his neurosurgeons office and reported intolerance to Eliquis and so he was switched back to Plavix.  Because of his intolerance to anticoagulation, he is referred to discuss left atrial appendage occlusion.        Their past medical, social and family history was reveiwed.   ROS:   Please see the history of present illness.    All other systems reviewed and are negative.  EKGs/Labs/Other Studies Reviewed:    The following studies were reviewed today:  March 02, 2023 echo EF 35-40 RV function normal Severely dilated left atrium Moderate TR        Physical Exam:    VS:  There were no vitals taken for this visit.    Wt Readings from Last 3 Encounters:  04/06/23 222 lb 0.1 oz (100.7 kg)  03/30/23 222 lb (100.7 kg)  02/28/23 222 lb 0.1 oz (100.7 kg)      GEN: *** Well nourished, well developed in no acute distress CARDIAC: ***Irregularly irregular, no murmurs, rubs, gallops RESPIRATORY:  Clear to auscultation without rales, wheezing or rhonchi       ASSESSMENT AND PLAN:    No diagnosis found.  #Permanent atrial fibrillation #Intolerance of anticoagulation The patient has permanent atrial fibrillation and takes metoprolol for rate control.  He has severely dilated left atrium on echo.  He is currently on DAPT given an intolerance to anticoagulation.  We discussed his stroke risk during today's clinic appointment.  We discussed the role of left atrial appendage occlusion for stroke risk mitigation in those patients who have contraindications to anticoagulation.  Unfortunately, given his severe chronic respiratory failure requiring supplemental oxygen, I do not think he is an acceptable candidate to undergo left atrial appendage occlusion.  I have recommended continuing his DAPT.  We have discussed the limitations of the strategy but unfortunately there are no clear alternatives for him.  #Coronary artery disease No ischemic symptoms today.  Continue DAPT  #Chronic systolic heart failure NYHA class II-III.  Warm and dry on exam today.  Continue losartan, metoprolol, torsemide.  Follow-up with with EP on an as-needed basis.        Signed, Rossie Muskrat. Lalla Brothers, MD, [facility], [facility] 05/21/2023 8:44 PM    Electrophysiology [facility]

## 2023-05-22 ENCOUNTER — Ambulatory Visit: Payer: HMO | Attending: Cardiology | Admitting: Cardiology

## 2023-05-23 ENCOUNTER — Telehealth: Payer: Self-pay

## 2023-05-23 ENCOUNTER — Encounter: Payer: Self-pay | Admitting: Cardiology

## 2023-05-23 NOTE — Telephone Encounter (Addendum)
The patient no-showed to his 05/24/2023 Watchman consult with Dr. Lalla Brothers. This was the third attempt at a visit.  Will send letter to call if the patient wishes to reschedule.  Referral closed.

## 2023-05-27 ENCOUNTER — Other Ambulatory Visit: Payer: Self-pay

## 2023-05-27 ENCOUNTER — Emergency Department: Payer: HMO

## 2023-05-27 ENCOUNTER — Emergency Department
Admission: EM | Admit: 2023-05-27 | Discharge: 2023-06-09 | Disposition: E | Payer: HMO | Attending: Emergency Medicine | Admitting: Emergency Medicine

## 2023-05-27 DIAGNOSIS — G319 Degenerative disease of nervous system, unspecified: Secondary | ICD-10-CM | POA: Diagnosis not present

## 2023-05-27 DIAGNOSIS — R069 Unspecified abnormalities of breathing: Secondary | ICD-10-CM | POA: Diagnosis not present

## 2023-05-27 DIAGNOSIS — Z7901 Long term (current) use of anticoagulants: Secondary | ICD-10-CM | POA: Insufficient documentation

## 2023-05-27 DIAGNOSIS — R0602 Shortness of breath: Secondary | ICD-10-CM | POA: Diagnosis not present

## 2023-05-27 DIAGNOSIS — R0789 Other chest pain: Secondary | ICD-10-CM | POA: Diagnosis not present

## 2023-05-27 DIAGNOSIS — W19XXXA Unspecified fall, initial encounter: Secondary | ICD-10-CM | POA: Insufficient documentation

## 2023-05-27 DIAGNOSIS — I509 Heart failure, unspecified: Secondary | ICD-10-CM | POA: Insufficient documentation

## 2023-05-27 DIAGNOSIS — I4891 Unspecified atrial fibrillation: Secondary | ICD-10-CM | POA: Insufficient documentation

## 2023-05-27 DIAGNOSIS — M8588 Other specified disorders of bone density and structure, other site: Secondary | ICD-10-CM | POA: Diagnosis not present

## 2023-05-27 DIAGNOSIS — R0603 Acute respiratory distress: Secondary | ICD-10-CM | POA: Diagnosis not present

## 2023-05-27 DIAGNOSIS — M549 Dorsalgia, unspecified: Secondary | ICD-10-CM | POA: Diagnosis not present

## 2023-05-27 DIAGNOSIS — R519 Headache, unspecified: Secondary | ICD-10-CM | POA: Insufficient documentation

## 2023-05-27 DIAGNOSIS — M545 Low back pain, unspecified: Secondary | ICD-10-CM | POA: Diagnosis not present

## 2023-05-27 DIAGNOSIS — I2510 Atherosclerotic heart disease of native coronary artery without angina pectoris: Secondary | ICD-10-CM | POA: Insufficient documentation

## 2023-05-27 DIAGNOSIS — R7309 Other abnormal glucose: Secondary | ICD-10-CM | POA: Insufficient documentation

## 2023-05-27 DIAGNOSIS — M4855XA Collapsed vertebra, not elsewhere classified, thoracolumbar region, initial encounter for fracture: Secondary | ICD-10-CM | POA: Diagnosis not present

## 2023-05-27 DIAGNOSIS — R0902 Hypoxemia: Secondary | ICD-10-CM | POA: Diagnosis not present

## 2023-05-27 DIAGNOSIS — J9621 Acute and chronic respiratory failure with hypoxia: Secondary | ICD-10-CM | POA: Diagnosis not present

## 2023-05-27 DIAGNOSIS — J449 Chronic obstructive pulmonary disease, unspecified: Secondary | ICD-10-CM | POA: Insufficient documentation

## 2023-05-27 DIAGNOSIS — I110 Hypertensive heart disease with heart failure: Secondary | ICD-10-CM | POA: Insufficient documentation

## 2023-05-27 DIAGNOSIS — M542 Cervicalgia: Secondary | ICD-10-CM | POA: Diagnosis not present

## 2023-05-27 DIAGNOSIS — J441 Chronic obstructive pulmonary disease with (acute) exacerbation: Secondary | ICD-10-CM | POA: Diagnosis not present

## 2023-05-27 DIAGNOSIS — I517 Cardiomegaly: Secondary | ICD-10-CM | POA: Diagnosis not present

## 2023-05-27 DIAGNOSIS — J439 Emphysema, unspecified: Secondary | ICD-10-CM | POA: Diagnosis not present

## 2023-05-27 DIAGNOSIS — R109 Unspecified abdominal pain: Secondary | ICD-10-CM | POA: Insufficient documentation

## 2023-05-27 DIAGNOSIS — J90 Pleural effusion, not elsewhere classified: Secondary | ICD-10-CM | POA: Diagnosis not present

## 2023-05-27 DIAGNOSIS — R079 Chest pain, unspecified: Secondary | ICD-10-CM | POA: Diagnosis not present

## 2023-05-27 DIAGNOSIS — G9389 Other specified disorders of brain: Secondary | ICD-10-CM | POA: Diagnosis not present

## 2023-05-27 DIAGNOSIS — R918 Other nonspecific abnormal finding of lung field: Secondary | ICD-10-CM | POA: Diagnosis not present

## 2023-05-27 DIAGNOSIS — R29890 Loss of height: Secondary | ICD-10-CM | POA: Diagnosis not present

## 2023-05-27 LAB — COMPREHENSIVE METABOLIC PANEL
ALT: 51 U/L — ABNORMAL HIGH (ref 0–44)
AST: 88 U/L — ABNORMAL HIGH (ref 15–41)
Albumin: 3.6 g/dL (ref 3.5–5.0)
Alkaline Phosphatase: 135 U/L — ABNORMAL HIGH (ref 38–126)
Anion gap: 16 — ABNORMAL HIGH (ref 5–15)
BUN: 26 mg/dL — ABNORMAL HIGH (ref 8–23)
CO2: 33 mmol/L — ABNORMAL HIGH (ref 22–32)
Calcium: 8.9 mg/dL (ref 8.9–10.3)
Chloride: 87 mmol/L — ABNORMAL LOW (ref 98–111)
Creatinine, Ser: 1.16 mg/dL (ref 0.61–1.24)
GFR, Estimated: >60 mL/min (ref >60)
Glucose, Bld: 134 mg/dL — ABNORMAL HIGH (ref 70–99)
Potassium: 5.1 mmol/L (ref 3.5–5.1)
Sodium: 136 mmol/L (ref 135–145)
Total Bilirubin: 2.2 mg/dL — ABNORMAL HIGH (ref 0.3–1.2)
Total Protein: 7.4 g/dL (ref 6.5–8.1)

## 2023-05-27 LAB — CBC WITH DIFFERENTIAL/PLATELET
Abs Immature Granulocytes: 0.26 10*3/uL — ABNORMAL HIGH (ref 0.00–0.07)
Basophils Absolute: 0.1 10*3/uL (ref 0.0–0.1)
Basophils Relative: 0 %
Eosinophils Absolute: 0 10*3/uL (ref 0.0–0.5)
Eosinophils Relative: 0 %
HCT: 49.5 % (ref 39.0–52.0)
Hemoglobin: 14.4 g/dL (ref 13.0–17.0)
Immature Granulocytes: 2 %
Lymphocytes Relative: 10 %
Lymphs Abs: 1.4 10*3/uL (ref 0.7–4.0)
MCH: 29 pg (ref 26.0–34.0)
MCHC: 29.1 g/dL — ABNORMAL LOW (ref 30.0–36.0)
MCV: 99.6 fL (ref 80.0–100.0)
Monocytes Absolute: 1.6 10*3/uL — ABNORMAL HIGH (ref 0.1–1.0)
Monocytes Relative: 12 %
Neutro Abs: 10 10*3/uL — ABNORMAL HIGH (ref 1.7–7.7)
Neutrophils Relative %: 76 %
Platelets: 341 10*3/uL (ref 150–400)
RBC: 4.97 MIL/uL (ref 4.22–5.81)
RDW: 16 % — ABNORMAL HIGH (ref 11.5–15.5)
WBC: 13.2 10*3/uL — ABNORMAL HIGH (ref 4.0–10.5)
nRBC: 0.2 % (ref 0.0–0.2)

## 2023-05-27 LAB — TROPONIN I (HIGH SENSITIVITY): Troponin I (High Sensitivity): 59 ng/L — ABNORMAL HIGH (ref <18)

## 2023-05-27 MED ORDER — LIDOCAINE 5 % EX PTCH
1.0000 | MEDICATED_PATCH | CUTANEOUS | Status: DC
  Filled 2023-05-27: qty 1

## 2023-05-27 MED ORDER — METOPROLOL TARTRATE 5 MG/5ML IV SOLN
5.0000 mg | Freq: Once | INTRAVENOUS | Status: AC
Start: 2023-05-27 — End: 2023-05-27
  Administered 2023-05-27: 5 mg via INTRAVENOUS
  Filled 2023-05-27: qty 5

## 2023-05-27 MED ORDER — IPRATROPIUM-ALBUTEROL 0.5-2.5 (3) MG/3ML IN SOLN
9.0000 mL | Freq: Once | RESPIRATORY_TRACT | Status: AC
  Administered 2023-05-27: 9 mL via RESPIRATORY_TRACT
  Filled 2023-05-27: qty 9

## 2023-05-27 MED ORDER — METHYLPREDNISOLONE SODIUM SUCC 125 MG IJ SOLR
125.0000 mg | Freq: Once | INTRAMUSCULAR | Status: AC
  Administered 2023-05-27: 125 mg via INTRAVENOUS
  Filled 2023-05-27: qty 2

## 2023-05-27 MED ORDER — ACETAMINOPHEN 500 MG PO TABS
1000.0000 mg | ORAL_TABLET | Freq: Once | ORAL | Status: DC
  Filled 2023-05-27: qty 2

## 2023-05-27 MED ORDER — IOHEXOL 350 MG/ML SOLN
100.0000 mL | Freq: Once | INTRAVENOUS | Status: AC | PRN
  Administered 2023-05-27: 100 mL via INTRAVENOUS

## 2023-05-27 MED ORDER — LACTATED RINGERS IV BOLUS: 1000.0000 mL | Freq: Once | INTRAVENOUS | Status: DC

## 2023-05-27 NOTE — ED Provider Notes (Signed)
[facility] Provider Note    Event Date/Time   First MD Initiated Contact with Patient 11-Jun-2023 1920     (approximate)   History   Chief Complaint Respiratory Distress and Fall   HPI  Erik [name] is a 67 y.o. male with past medical history of hypertension, CAD, atrial fibrillation on Eliquis, COPD, CHF, pulmonary hypertension, and chronic hypoxic respiratory failure on 4 L nasal cannula who presents to the ED complaining of shortness of breath.  Patient reports that he has been feeling increasingly short of breath with a productive cough for the past couple of days, describes some associated chest tightness but denies chest pain.  When EMS arrived on scene, patient was found down on the ground and unable to get up on his own.  He states that he tried to sit on the chair but that it slipped out from under him and he fell onto his back.  He reports hitting his head but denies losing consciousness, now complains of severe pain in the middle of his lower back.  EMS reports that patient with oxygen saturations of mid 80s on his usual 4 L nasal cannula, increased to 6 L with improvement.     Physical Exam   Triage Vital Signs: ED Triage Vitals  Encounter Vitals Group     BP      Systolic BP Percentile      Diastolic BP Percentile      Pulse      Resp      Temp      Temp src      SpO2      Weight      Height      Head Circumference      Peak Flow      Pain Score      Pain Loc      Pain Education      Exclude from Growth Chart     Most recent vital signs: Vitals:   2023-06-11 2100 2023-06-11 2200  BP: (!) 110/93 111/87  Pulse:    Resp: (!) 35 (!) 32  Temp:    SpO2:      Constitutional: Alert and oriented. Eyes: Conjunctivae are normal. Head: Atraumatic. Nose: No congestion/rhinnorhea. Mouth/Throat: Mucous membranes are moist.  Neck: No midline cervical spine tenderness to palpation. Cardiovascular: Tachycardic, irregularly irregular rhythm.  Grossly normal heart sounds.  2+ radial pulses bilaterally. Respiratory: Tachypneic with increased work of breathing and pursed lip breathing, very poor air movement throughout but no obvious wheezing or crackles noted. Gastrointestinal: Soft with diffuse tenderness to palpation. No distention. Musculoskeletal: 1+ pitting edema to knees bilaterally, chronic appearing skin changes noted.  Midline lumbar spinal tenderness to palpation noted, no thoracic spinal tenderness to palpation noted.  No extremity bony tenderness to palpation. Neurologic:  Normal speech and language. No gross focal neurologic deficits are appreciated.    ED Results / Procedures / Treatments   Labs (all labs ordered are listed, but only abnormal results are displayed) Labs Reviewed  CBC WITH DIFFERENTIAL/PLATELET - Abnormal; Notable for the following components:      Result Value   WBC 13.2 (*)    MCHC 29.1 (*)    RDW 16.0 (*)    Neutro Abs 10.0 (*)    Monocytes Absolute 1.6 (*)    Abs Immature Granulocytes 0.26 (*)    All other components within normal limits  COMPREHENSIVE METABOLIC PANEL - Abnormal; Notable for the following components:  Chloride 87 (*)    CO2 33 (*)    Glucose, Bld 134 (*)    BUN 26 (*)    AST 88 (*)    ALT 51 (*)    Alkaline Phosphatase 135 (*)    Total Bilirubin 2.2 (*)    Anion gap 16 (*)    All other components within normal limits  TROPONIN I (HIGH SENSITIVITY) - Abnormal; Notable for the following components:   Troponin I (High Sensitivity) 59 (*)    All other components within normal limits  BLOOD GAS, VENOUS  TROPONIN I (HIGH SENSITIVITY)     EKG  ED ECG REPORT I, Chesley Noon, the attending physician, personally viewed and interpreted this ECG.   Date: 2023/06/11  EKG Time: 22:19  Rate: 130  Rhythm: atrial fibrillation  Axis: Normal  Intervals:none  ST&T Change: None  RADIOLOGY Chest x-ray reviewed and interpreted by me with no infiltrate, edema, or  effusion.  PROCEDURES:  Critical Care performed: Yes, see critical care procedure note(s)  .Critical Care  Performed by: Chesley Noon, MD Authorized by: Chesley Noon, MD   Critical care provider statement:    Critical care time (minutes):  30   Critical care time was exclusive of:  Separately billable procedures and treating other patients and teaching time   Critical care was necessary to treat or prevent imminent or life-threatening deterioration of the following conditions:  Respiratory failure   Critical care was time spent personally by me on the following activities:  Development of treatment plan with patient or surrogate, discussions with consultants, evaluation of patient's response to treatment, examination of patient, ordering and review of laboratory studies, ordering and review of radiographic studies, ordering and performing treatments and interventions, pulse oximetry, re-evaluation of patient's condition and review of old charts   I assumed direction of critical care for this patient from another provider in my specialty: no     Care discussed with: admitting provider   Procedure Name: Intubation Date/Time: 06/03/2023 12:56 AM  Performed by: Chesley Noon, MDPre-anesthesia Checklist: Patient identified, Patient being monitored, Emergency Drugs available, Timeout performed and Suction available Oxygen Delivery Method: Non-rebreather mask Preoxygenation: Pre-oxygenation with 100% oxygen Induction Type: Rapid sequence Ventilation: Mask ventilation without difficulty Laryngoscope Size: Glidescope and 3 Tube size: 7.5 mm Number of attempts: 1 Placement Confirmation: ETT inserted through vocal cords under direct vision, CO2 detector and Breath sounds checked- equal and bilateral Secured at: 24 cm Tube secured with: ETT holder Dental Injury: Teeth and Oropharynx as per pre-operative assessment        MEDICATIONS ORDERED IN ED: Medications  acetaminophen  (TYLENOL) tablet 1,000 mg (has no administration in time range)  lidocaine (LIDODERM) 5 % 1 patch (has no administration in time range)  lactated ringers bolus 1,000 mL (has no administration in time range)  ipratropium-albuterol (DUONEB) 0.5-2.5 (3) MG/3ML nebulizer solution 9 mL (9 mLs Nebulization Given June 11, 2023 2030)  methylPREDNISolone sodium succinate (SOLU-MEDROL) 125 mg/2 mL injection 125 mg (125 mg Intravenous Given 2023-06-11 2037)  iohexol (OMNIPAQUE) 350 MG/ML injection 100 mL (100 mLs Intravenous Contrast Given June 11, 2023 2315)  metoprolol tartrate (LOPRESSOR) injection 5 mg (5 mg Intravenous Given 2023-06-11 2241)     IMPRESSION / MDM / ASSESSMENT AND PLAN / ED COURSE  I reviewed the triage vital signs and the nursing notes.                              67 y.o.  male with past medical history of hypertension, atrial fibrillation on Eliquis, CAD, CHF, pulmonary hypertension, and COPD with chronic hypoxic respiratory failure who presents to the ED complaining of increasing difficulty breathing for a couple of days followed by a fall today when he attempted to sit down on a chair.  Patient's presentation is most consistent with acute presentation with potential threat to life or bodily function.  Differential diagnosis includes, but is not limited to, ACS, arrhythmia, PE, pneumonia, COPD exacerbation, CHF exacerbation, anemia, electrolyte abnormality, AKI, intracranial injury, cervical spine injury, lumbar spinal injury.  Patient ill-appearing and in moderate respiratory distress with tachypnea and accessory muscle use, very poor air movement noted throughout.  We will transition patient to BiPAP, treat with DuoNebs and IV Solu-Medrol for suspected COPD exacerbation.  EKG shows atrial fibrillation with RVR, no ischemic changes noted.  We will hold off on rate control medication for now as we stabilize his respiratory status.  He will eventually need imaging to rule out traumatic injury,  especially given he is anticoagulated.  We will check CT head and cervical spine as well as CTA of his chest and CT of his abdomen/pelvis and lumbar spine.  No evidence of traumatic injury to his extremities.  Chest x-ray is unremarkable, labs show mild leukocytosis but no significant anemia, electrolyte abnormality, or AKI.  LFTs with mild transaminitis, troponin slightly elevated beyond patient's typical baseline.  We will trend troponin and hold off on heparin for now given no chest pain on reassessment.  Air movement and work of breathing seem to be improved, patient given IV metoprolol for rate control of his atrial fibrillation.  Patient turned over to oncoming provider pending CT results and admission.  ----------------------------------------- 12:57 AM on 2023/06/10 ----------------------------------------- Patient had rapid decompensation with drop in blood pressure and change in mental status.  Respiratory rate and heart rate rapidly declining when I was asked to reassess the patient, no palpable pulse at that time and CPR was initiated.  Patient initially in PEA but then noted to have wide-complex irregular tachycardia, could represent V. tach versus atrial fibrillation with aberrancy.  Patient received 2 rounds of epinephrine, bicarb, and magnesium with loading dose of amiodarone. He was defibrillated twice, appeared to return to narrow complex atrial fibrillation after the second shock with return of pulses.  Dr. Katrinka Blazing placed right femoral central line.  Patient then had second cardiac arrest before Levophed could be initiated, received 1 round of compressions for PEA, but had R SOC following 1 dose of epinephrine.  Patient started on Levophed and case discussed with ICU team for admission.  CT head with no hemorrhage by my read and CTA chest with no PE by my read.      FINAL CLINICAL IMPRESSION(S) / ED DIAGNOSES   Final diagnoses:  COPD exacerbation (HCC)  Acute on chronic respiratory  failure with hypoxia (HCC)  Atrial fibrillation with RVR (HCC)  Fall, initial encounter     Rx / DC Orders   ED Discharge Orders     None        Note:  This document was prepared using Dragon voice recognition software and may include unintentional dictation errors.   Chesley Noon, MD 06/04/2023 Joseph Pierini    Chesley Noon, MD 06-10-23 0100

## 2023-05-27 NOTE — ED Triage Notes (Signed)
Pt comes in by EMS from home where he fell trying to get into his chair was unable to get up due to the pain in his back 5/10 mid lower back. Pt stated he hit his head. EMS found pt on the ground. Pt was found to have difficulty breathing which pt states is his baseline - he wears 3L [place] all the time. Pt arrives with elevated HR 120s-130s Afib RVR. Pt states he is on blood thinners.

## 2023-05-28 DIAGNOSIS — I4891 Unspecified atrial fibrillation: Secondary | ICD-10-CM | POA: Diagnosis not present

## 2023-05-28 DIAGNOSIS — J9621 Acute and chronic respiratory failure with hypoxia: Secondary | ICD-10-CM | POA: Diagnosis not present

## 2023-05-28 DIAGNOSIS — J441 Chronic obstructive pulmonary disease with (acute) exacerbation: Secondary | ICD-10-CM | POA: Diagnosis not present

## 2023-05-28 LAB — CBG MONITORING, ED
Glucose-Capillary: 214 mg/dL — ABNORMAL HIGH (ref 70–99)
Glucose-Capillary: 28 mg/dL — CL (ref 70–99)
Glucose-Capillary: 51 mg/dL — ABNORMAL LOW (ref 70–99)

## 2023-05-28 MED ORDER — NOREPINEPHRINE 4 MG/250ML-% IV SOLN
2.0000 ug/min | INTRAVENOUS | Status: DC
Start: 2023-05-28 — End: 2023-05-28
  Administered 2023-05-28: 5 ug/min via INTRAVENOUS

## 2023-05-28 MED ORDER — NOREPINEPHRINE 4 MG/250ML-% IV SOLN
INTRAVENOUS | Status: AC
  Administered 2023-05-28: 10 ug/min via INTRAVENOUS
  Filled 2023-05-28: qty 250

## 2023-05-28 MED ORDER — CALCIUM CHLORIDE 10 % IV SOLN
INTRAVENOUS | Status: AC | PRN
  Administered 2023-05-28: 1 g via INTRAVENOUS

## 2023-05-28 MED ORDER — MIDAZOLAM HCL 2 MG/2ML IJ SOLN: 1.0000 mg | INTRAMUSCULAR | Status: DC | PRN

## 2023-05-28 MED ORDER — MAGNESIUM SULFATE 50 % IJ SOLN
INTRAMUSCULAR | Status: AC | PRN
  Administered 2023-05-28: 2 g via INTRAVENOUS

## 2023-05-28 MED ORDER — FENTANYL CITRATE PF 50 MCG/ML IJ SOSY
25.0000 ug | PREFILLED_SYRINGE | INTRAMUSCULAR | Status: DC | PRN
Start: 2023-05-28 — End: 2023-05-28

## 2023-05-28 MED ORDER — EPINEPHRINE 1 MG/10ML IJ SOSY
PREFILLED_SYRINGE | INTRAMUSCULAR | Status: AC | PRN
  Administered 2023-05-28 (×2): 1 mg via INTRAVENOUS

## 2023-05-28 MED ORDER — EPINEPHRINE HCL 5 MG/250ML IV SOLN IN NS
INTRAVENOUS | Status: AC
  Administered 2023-05-28: 0.5 ug/min via INTRAVENOUS
  Filled 2023-05-28: qty 250

## 2023-05-28 MED ORDER — AMIODARONE HCL IN DEXTROSE 360-4.14 MG/200ML-% IV SOLN: 30.0000 mg/h | INTRAVENOUS | Status: DC

## 2023-05-28 MED ORDER — FENTANYL CITRATE PF 50 MCG/ML IJ SOSY: 25.0000 ug | PREFILLED_SYRINGE | INTRAMUSCULAR | Status: DC | PRN

## 2023-05-28 MED ORDER — SODIUM BICARBONATE 8.4 % IV SOLN
INTRAVENOUS | Status: AC | PRN
  Administered 2023-05-28: 50 meq via INTRAVENOUS

## 2023-05-28 MED ORDER — EPINEPHRINE HCL 5 MG/250ML IV SOLN IN NS
0.5000 ug/min | INTRAVENOUS | Status: DC
Start: 2023-05-28 — End: 2023-05-28

## 2023-05-28 MED ORDER — AMIODARONE HCL IN DEXTROSE 360-4.14 MG/200ML-% IV SOLN
60.0000 mg/h | INTRAVENOUS | Status: DC
  Administered 2023-05-28: 60 mg/h via INTRAVENOUS
  Filled 2023-05-28: qty 200

## 2023-05-28 MED ORDER — EPINEPHRINE 1 MG/10ML IJ SOSY
PREFILLED_SYRINGE | INTRAMUSCULAR | Status: AC
  Filled 2023-05-28: qty 40

## 2023-05-28 MED ORDER — SODIUM CHLORIDE 0.9 % IV SOLN: 250.0000 mL | INTRAVENOUS | Status: DC

## 2023-05-29 LAB — BLOOD GAS, VENOUS
Bicarbonate: 43.3 mmol/L — ABNORMAL HIGH (ref 20.0–28.0)
Delivery systems: POSITIVE
Expiratory PAP: 5 cm[H2O]
FIO2: 40 %
Patient temperature: 37 cm[H2O]
Patient temperature: 37 mmol/L — ABNORMAL HIGH (ref 0.0–2.0)
pCO2, Ven: 86 mm[Hg] (ref 44–60)
pH, Ven: 7.31 (ref 7.25–7.43)
pO2, Ven: 43.3 mmol/L — ABNORMAL HIGH (ref 32–45)

## 2023-05-30 NOTE — ED Notes (Addendum)
This RN informed provider that pt's HR has sustained in afib RVR in 130s-140s. Provider to order meds.

## 2023-06-09 NOTE — ED Notes (Signed)
Provider Webb Silversmith arrived to room to run code.

## 2023-06-09 NOTE — Code Documentation (Signed)
Rhythm change - wide complex irregular atrial fibrillation with a pulse. Compressions stopped.

## 2023-06-09 NOTE — ED Notes (Signed)
Pt began to brady on the monitor. Provider Ouma used ultrasound machine and found no cardiac movement. Asystole on the monitor. Pronounced death @0136 . This RN and Berneda Rose, RN in the room. Family notified by provider Ouma.

## 2023-06-09 NOTE — ED Notes (Signed)
This RN informed provider that pt is having runs of Vtach.

## 2023-06-09 NOTE — Consult Note (Addendum)
NAME:  Erik [name], MRN:  [id number], DOB:  05-31-1956, LOS: 0 ADMISSION DATE:  Jun 03, 2023, INITIAL CONSULTATION DATE:  05/24/2023 REFERRING MD:  Chesley Noon, REASON FOR CONSULT: CARDIAC ARREST   Brief Patient Description  67 y.o. male with history of CAD status post BMS in 2009 to OM 2 and CTO of the proximal RCA by LHC in 2016, permanent A-fib dating back to 2012, HFrEF, chronic hypoxic respiratory failure on supplemental oxygen at baseline, HLD, COPD, and arthritis who presented to the ED with chief complaints of shortness of breath.  Per ED reports, patient fell and was unable to get up on his own he therefore called EMS.  On EMS arrival, patient was found with oxygen saturation in the mid 80s on his usual 4 L nasal cannula which was increased to 6 L.  ED Course: On arrival to the ED patient was noted to be in A-fib with RVR with increased work of breathing.  He was placed on BiPAP and treated with DuoNebs and IV Solu-Medrol for suspected COPD exacerbation.  EKG obtained showed A-fib with RVR.  CT head CT cervical spine obtained with no evidence of traumatic injury.  Patient received IV metoprolol for rate control.  At around 12:57 AM patient rapidly decompensated with a drop in blood pressure and change in mental status requiring emergent intubation.  Prior to intubation patient progressed to PEA cardiac arrest with wide-complex irregular tachycardia.  He was defibrillated twice, and appeared to return to narrow complex A-fib after the second shock with return of pulses.  He received 2 rounds of epi bicarb and magnesium with loading dose of amiodarone with ROSC achieved briefly then he lost pulses again.  Patient received second round of CPR again achieving ROSC for brief period of time then lost pulses again the third time.  He received another round of EPI with ROSC achieved, he was started on epi drip PCCM consulted.    On PCCM arrival to the bedside, patient was found with a CPR in progress  he had apparently lost pulses for the fourth time.  Patient received another dose of epi, blood sugar showed was 28 received 1 amp of glucose and and repeat dose again in 3 minutes.  He also received 1 amp sodium bicarb, calcium gluconate and a repeat dose of dextrose with ROSC achieved.  Contacted patient's family and informed them of patient's current status and possibility of going into cardiac arrest given his been resuscitated for total of 1 hour.  I explained to the family member that patient currently has poor prognosis with possible anoxic injury due to prolonged resuscitation.  Family stated they were on the way and concurred in the event that he lost pulses again no CPR should be attempted.    Pertinent  Medical History  OM 2 and CTO of the proximal RCA by LHC in 2016, permanent A-fib dating back to 2012, HFrEF, chronic hypoxic respiratory failure on supplemental oxygen at baseline, HLD, COPD, and arthritis   Significant Hospital Events: Including procedures, antibiotic start and stop dates in addition to other pertinent events   06-03-2023: Presented to the ED with chief complaints of shortness of breath 10/20: Developed acute respiratory failure.  Placed on BiPAP subsequently suffered PEA cardiac arrest.  OBJECTIVE   Blood pressure 111/87, pulse (!) 122, temperature 97.8 F (36.6 C), temperature source Axillary, resp. rate (!) 32, height 5\' 9"  (1.753 m), weight 108.9 kg, SpO2 96%.       No intake or output  data in the 24 hours ending 05/11/2023 0144 Filed Weights   06-12-23 1930  Weight: 108.9 kg   Labs/imaging that I havepersonally reviewed  (right click and "Reselect all SmartList Selections" daily)    Labs   CBC: Recent Labs  Lab 06/12/2023 1930  WBC 13.2*  NEUTROABS 10.0*  HGB 14.4  HCT 49.5  MCV 99.6  PLT 341    Basic Metabolic Panel: Recent Labs  Lab June 12, 2023 1930  NA 136  K 5.1  CL 87*  CO2 33*  GLUCOSE 134*  BUN 26*  CREATININE 1.16  CALCIUM 8.9    GFR: Estimated Creatinine Clearance: 73.1 mL/min (by C-G formula based on SCr of 1.16 mg/dL). Recent Labs  Lab 2023-06-12 1930  WBC 13.2*    Liver Function Tests: Recent Labs  Lab 12-Jun-2023 1930  AST 88*  ALT 51*  ALKPHOS 135*  BILITOT 2.2*  PROT 7.4  ALBUMIN 3.6   No results for input(s): "LIPASE", "AMYLASE" in the last 168 hours. No results for input(s): "AMMONIA" in the last 168 hours.  ABG    Component Value Date/Time   PHART 7.53 (H) 08/28/2022 0012   PCO2ART 43 08/28/2022 0012   PO2ART 116 (H) 08/28/2022 0012   HCO3 43.3 (H) 2023-06-12 2223   ACIDBASEDEF 0.2 08/13/2017 1941   O2SAT PENDING June 12, 2023 2223     Coagulation Profile: No results for input(s): "INR", "PROTIME" in the last 168 hours.  Cardiac Enzymes: No results for input(s): "CKTOTAL", "CKMB", "CKMBINDEX", "TROPONINI" in the last 168 hours.  HbA1C: Hemoglobin A1C  Date/Time Value Ref Range Status  07/19/2013 04:17 AM 6.3 4.2 - 6.3 % Final    Comment:    The American Diabetes Association recommends that a primary goal of therapy should be <7% and that physicians should reevaluate the treatment regimen in patients with HbA1c values consistently >8%.    Hgb A1c MFr Bld  Date/Time Value Ref Range Status  08/27/2022 06:10 PM 6.3 (H) 4.8 - 5.6 % Final    Comment:    (NOTE) Pre diabetes:          5.7%-6.4%  Diabetes:              >6.4%  Glycemic control for   <7.0% adults with diabetes     CBG: Recent Labs  Lab 05/09/2023 0108 06/07/2023 0114 06/04/2023 0122  GLUCAP 28* 51* 214*    Allergies Allergies  Allergen Reactions   Benadryl [Diphenhydramine Hcl] Shortness Of Breath   Morphine And Codeine Swelling    Difficulty breathing per patient    Oxycodone Swelling    Severe mouth swelling requiring medical intervention   Gabapentin (Once-Daily)      Home Medications  Prior to Admission medications   Medication Sig Start Date End Date Taking? Authorizing Provider  acetaminophen  (TYLENOL) 325 MG tablet Take 2 tablets (650 mg total) by mouth every 6 (six) hours as needed for mild pain (or Fever >/= 101). 09/07/22   Hollice Espy, MD  albuterol (VENTOLIN HFA) 108 (90 Base) MCG/ACT inhaler Inhale 1 puff into the lungs every 6 (six) hours as needed for wheezing or shortness of breath. 02/17/22   Lyndon Code, MD  aspirin EC 81 MG tablet Take 1 tablet (81 mg total) by mouth daily. Swallow whole. 09/08/22   Hollice Espy, MD  atorvastatin (LIPITOR) 80 MG tablet Take 1 tablet (80 mg total) by mouth daily. 03/04/23   Enedina Finner, MD  clopidogrel (PLAVIX) 75 MG tablet Take 75 mg  by mouth daily.    [provider]  dextromethorphan-guaiFENesin (ROBITUSSIN-DM) 10-100 MG/5ML liquid Take 5 mLs by mouth every 4 (four) hours as needed for cough.    [provider]  gabapentin (NEURONTIN) 100 MG capsule Take 100 mg by mouth 3 (three) times daily.    [provider]  ipratropium-albuterol (DUONEB) 0.5-2.5 (3) MG/3ML SOLN USE 1 VIAL IN NEBULIZER EVERY 6 HOURS 01/27/23   Sallyanne Kuster, NP  losartan (COZAAR) 25 MG tablet Take 0.5 tablets (12.5 mg total) by mouth daily. 03/04/23   Enedina Finner, MD  metoprolol succinate (TOPROL-XL) 100 MG 24 hr tablet Take 1 tablet (100 mg total) by mouth daily. Take with or immediately following a meal. 03/04/23   Enedina Finner, MD  nitroGLYCERIN (NITROSTAT) 0.4 MG SL tablet Place 1 tablet (0.4 mg total) under the tongue every 5 (five) minutes as needed for chest pain. 03/04/23   Enedina Finner, MD  nystatin powder Apply 1 Application topically 3 (three) times daily.    [provider]  pantoprazole (PROTONIX) 40 MG tablet Take 1 tablet (40 mg total) by mouth daily. 03/04/23   Enedina Finner, MD  polyethylene glycol (MIRALAX / GLYCOLAX) 17 g packet Take 17 g by mouth daily.    [provider]  senna-docusate (SENOKOT-S) 8.6-50 MG tablet Take 2 tablets by mouth 2 (two) times daily. 03/04/23   Enedina Finner, MD  tamsulosin  (FLOMAX) 0.4 MG CAPS capsule Take 1 capsule (0.4 mg total) by mouth daily after supper. 03/04/23   Enedina Finner, MD  torsemide (DEMADEX) 20 MG tablet Take 1 tablet (20 mg total) by mouth daily. 03/28/23 06/26/23  Sondra Barges, PA-C  traMADol (ULTRAM) 50 MG tablet Take 2 tablets (100 mg total) by mouth every 6 (six) hours as needed for severe pain. 03/04/23   Enedina Finner, MD  TRELEGY ELLIPTA 100-62.5-25 MCG/ACT AEPB INHALE 1 PUFF BY MOUTH INTO LUNGS DAILY 05/25/22   Sallyanne Kuster, NP  Scheduled Meds:  acetaminophen  1,000 mg Oral Once   clonazePAM  0.5 mg Oral Once   EPINEPHrine       EPINEPHrine NaCl       lidocaine  1 patch Transdermal Q24H   metoprolol succinate  100 mg Oral Daily   Continuous Infusions:  sodium chloride     amiodarone     amiodarone     epinephrine     lactated ringers     norepinephrine     norepinephrine (LEVOPHED) Adult infusion 5 mcg/min (05/15/2023 0043)   PRN Meds:.EPINEPHrine, EPINEPHrine NaCl, fentaNYL (SUBLIMAZE) injection, fentaNYL (SUBLIMAZE) injection, midazolam, norepinephrine   ASSESSMENT & PLAN  PEA Cardiac Arrest:  Multifactorial Shock: Cardiogenic +/- Septic Elevated Troponin, demand ischemia vs NSTEMI Atrial fibrillation w/ RVR   -Continuous cardiac monitoring -Maintain MAP >65 -IV fluids; Check CVP for further fluid resuscitation -Vasopressors as needed to maintain MAP goal (Levophed, Epi, Vasopressin) -Trend lactic acid until normalized -Trend HS Troponin until peaked -Echocardiogram pending -UDS pending -CTA Chest negative for PE -Check serum cortisol and Thyroid panel -Will start Solu-cortef due to multiple vasopressors -Consider Cardiology consult and Heparin gtt (likely not candidate for ischemic workup given poor Neurological prognosis) -Normothermia protocol   Acute Hypoxic & Hypercapnic Respiratory Failure in setting of cardiac arrest with suspected aspiration -Full vent support, implement lung protective strategies -Plateau  pressures less than 30 cm H20 -Wean FiO2 & PEEP as tolerated to maintain O2 sats >92% -Follow intermittent Chest X-ray & ABG as needed -Spontaneous Breathing Trials when respiratory  parameters met and mental status permits -Implement VAP Bundle -Prn Bronchodilators -Start Unasyn   Unfortunately after >1 hour of resuscitation efforts, code was called off due to extremely poor prognosis patient pronounced dead at 1:36 AM.  Family members contacted currently at the bedside.  PCCM will not admit.  EDP to resume care discussed with Dr. Katrinka Blazing who will handle post arrest/mortem care  Critical care time: 35 minutes       Webb Silversmith, DNP, FNP-C, AGACNP-BC Acute Care Nurse Practitioner  [facility] Pager: [phone] [place] [facility]

## 2023-06-09 NOTE — ED Notes (Signed)
Provider Ouma on the phone with family to notify of situation and possible impending death. Provider informed them to come to the hospital immediately.

## 2023-06-09 NOTE — Code Documentation (Addendum)
Compressions stopped. Pulse check- no pulse. Rhythm Vtach. Compressions continued while defibrillator charged. Shock delivered 200J.

## 2023-06-09 NOTE — ED Notes (Signed)
Pt started to brady down on the cardiac monitor - lost pulse. Initiated CPR. Epi given @0043 . CPR continued. Pulse check @0045 - no pulse or shockable rhythm (PEA). CPR continued. Epi given @0047 . Rhythm change on monitor to wide complex Afib. Pulse returned 0048.

## 2023-06-09 NOTE — ED Notes (Signed)
[facility] was called and given reference # [id number]. Medical Examiner was called and they notified this RN they will not be taking the case.

## 2023-06-09 NOTE — ED Notes (Addendum)
Pt's HR went bradycardic and we lost pulse. Resumed CPR. 1mg  Epi given @0101 . Pulse check @0102 . Pulse present @0103  with wide complex afib rhythm.

## 2023-06-09 NOTE — ED Notes (Signed)
This RN transported pt to CT and back with telemonitor accompanied with RT - maintaining Bipap

## 2023-06-09 NOTE — ED Notes (Signed)
Pt was placed in body bag with tags in place. Pt transported to morgue.

## 2023-06-09 NOTE — Code Documentation (Addendum)
Compressions stopped - pulse check. No pulse. PEA on monitor. Resumed compressions

## 2023-06-09 NOTE — ED Notes (Signed)
Calcium given @0112 . D50 given @0117 

## 2023-06-09 NOTE — ED Provider Notes (Signed)
I assisted Dr. Larinda Buttery by placing a right femoral central line.  He had discussed the case with ICU team for admission and Lanora Manis NP is in the ED for admission and patient has multiple subsequent recurrent cardiac arrests.  She discussed this with the wife and decision is made to no longer resuscitate considering an hour of on and off coding despite maximal efforts.  Patient unfortunately expires in the ED.    .Critical Care  Performed by: Delton Prairie, MD Authorized by: Delton Prairie, MD   Critical care provider statement:    Critical care time (minutes):  30   Critical care time was exclusive of:  Separately billable procedures and treating other patients   Critical care was necessary to treat or prevent imminent or life-threatening deterioration of the following conditions:  Cardiac failure   Critical care was time spent personally by me on the following activities:  Development of treatment plan with patient or surrogate, discussions with consultants, evaluation of patient's response to treatment, examination of patient, ordering and review of laboratory studies, ordering and review of radiographic studies, ordering and performing treatments and interventions, pulse oximetry, re-evaluation of patient's condition and review of old charts .Central Line  Date/Time: 22-Jun-2023 1:40 AM  Performed by: Delton Prairie, MD Authorized by: Delton Prairie, MD   Consent:    Consent obtained:  Emergent situation Pre-procedure details:    Indication(s): central venous access     Hand hygiene: Hand hygiene performed prior to insertion   Procedure details:    Location:  R femoral   Site selection rationale:  Coding   Patient position:  Supine   Procedural supplies:  Triple lumen   Catheter size:  7 Fr   Landmarks identified: yes     Ultrasound guidance: no     Number of attempts:  1   Successful placement: yes   Post-procedure details:    Post-procedure:  Dressing applied and line sutured    Assessment:  Blood return through all ports and free fluid flow   Procedure completion:  Tolerated well, no immediate complications Comments:     Dirty, emergent right fem line while patient is being coded     Delton Prairie, MD 2023-06-22 [phone]

## 2023-06-09 NOTE — ED Notes (Signed)
This RN having difficulty obtaining pulse ox reading due to poor perfusion to fingers - fingers cold. This RN attempting with replacing pulse ox - reading will show up briefly with low pleth. When reading comes through it is around 92-94%.

## 2023-06-09 NOTE — Progress Notes (Signed)
Pt underwent multiple rounds of cpr, which was finally terminated and pt declared deceased

## 2023-06-09 NOTE — ED Notes (Signed)
This RN entered the room to administer meds to pt. This RN removed bipap and put on 6L [place]. Pt was given a sip of water. Tolerated well, however O2 sats were dropping. This RN put back on the Bipap. Pulse ox was having a hard time reading. Pt started saying " I'm seeing stars". This RN and another nurse repositioned. Pt started acting altered. Fellow nurse grabbed doctor. Doctor came in and pt was losing consciousness. Doctor began bagging pt. Pt's HR started dropping. Pt lost pulse. This RN initiated CPR.

## 2023-06-09 NOTE — ED Notes (Addendum)
Pt lost pulse - PEA on the monitor. D50 given @0109 . 1mg  Epi given @0109 . Vial Bicarb given @0110 . Pulse check @0110 . Pulse return with afib on the monitor @0111 .

## 2023-06-09 NOTE — Progress Notes (Signed)
Please disregard VBG results from 22:23 on 05/17/2023.  It was a much older sample that was run inadvertently. A new sample will be drawn subsequently

## 2023-06-09 NOTE — ED Notes (Signed)
Pt having difficulty keeping pulse ox on. Pulse ox reading poor. Pt's hands cold. This RN attempted to obtain reading from ear lobe. Pulse ox reading 93% on monitor - poor pleth.

## 2023-06-09 NOTE — ED Notes (Signed)
Compressions stopped for pulse check- rhythm vtach with no pulse - defib charged and shock delivered 200J.

## 2023-06-09 NOTE — ED Notes (Signed)
Pt lost pulse and was PEA on the monitor. Resumed compressions @0117 . 1mg  Epi given @0119 . Pulse return with Afib on the monitor @0120 .

## 2023-06-09 DEATH — deceased

## 2023-06-14 ENCOUNTER — Encounter (INDEPENDENT_AMBULATORY_CARE_PROVIDER_SITE_OTHER): Payer: HMO

## 2023-06-14 ENCOUNTER — Ambulatory Visit (INDEPENDENT_AMBULATORY_CARE_PROVIDER_SITE_OTHER): Payer: HMO | Admitting: Nurse Practitioner

## 2023-06-20 ENCOUNTER — Ambulatory Visit (INDEPENDENT_AMBULATORY_CARE_PROVIDER_SITE_OTHER): Payer: HMO | Admitting: Nurse Practitioner

## 2023-07-21 ENCOUNTER — Telehealth: Payer: Self-pay | Admitting: Internal Medicine

## 2023-07-21 NOTE — Telephone Encounter (Signed)
08/08/21-08/08/23 MR uploaded to [employer]; [email or web address] @ 9:36 a.m.-Toni

## 2023-10-02 ENCOUNTER — Ambulatory Visit (INDEPENDENT_AMBULATORY_CARE_PROVIDER_SITE_OTHER): Payer: HMO | Admitting: Vascular Surgery

## 2023-10-02 ENCOUNTER — Encounter (INDEPENDENT_AMBULATORY_CARE_PROVIDER_SITE_OTHER): Payer: HMO

## 2024-03-03 IMAGING — MR MR LUMBAR SPINE W/O CM
5 series · 33 of 48 positions shown · non-contrast
Comparison: None Available.

CLINICAL DATA: Low back pain, chronic.

EXAM:
MRI LUMBAR SPINE WITHOUT CONTRAST
TECHNIQUE: Multiplanar, multisequence MR imaging of the lumbar spine was
performed. No intravenous contrast was administered.

[Series 5: T2 · sagittal · 4.0mm · 1.02mm/px · 6 of 16 slices shown (1 of 2)]
[im 1/16]
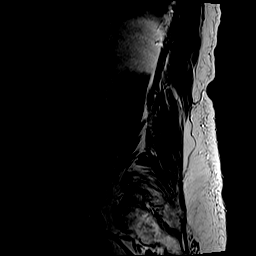
[im 4/16]
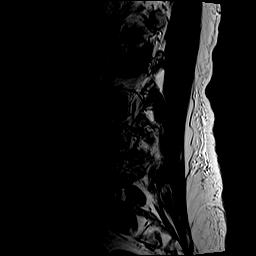
[im 7/16]
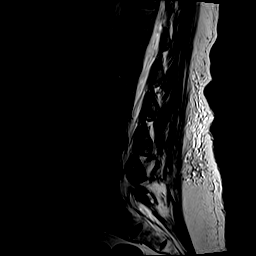
[im 10/16]
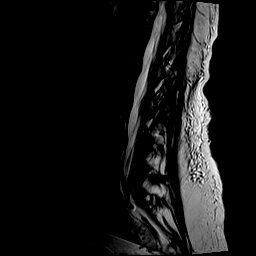
[im 13/16]
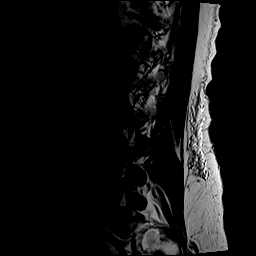
[im 16/16]
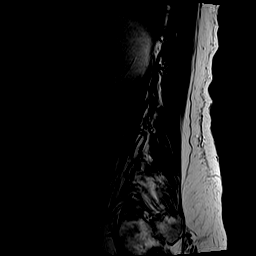

[Series 6: T1 · sagittal · 4.0mm · 1.02mm/px · 6 of 16 slices shown (1 of 2)]
[im 1/16]
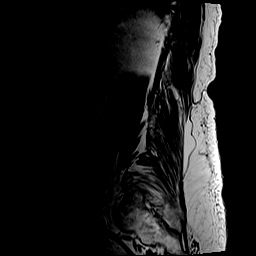
[im 4/16]
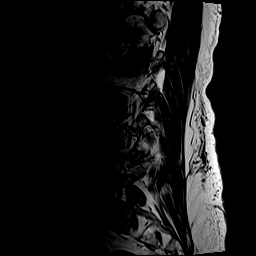
[im 7/16]
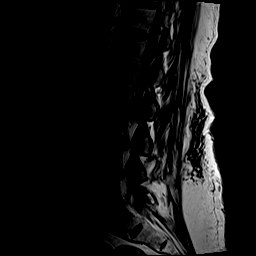
[im 10/16]
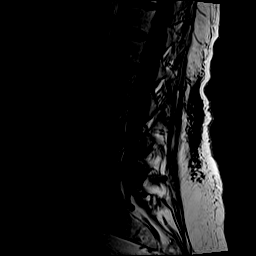
[im 13/16]
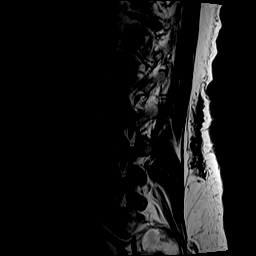
[im 16/16]
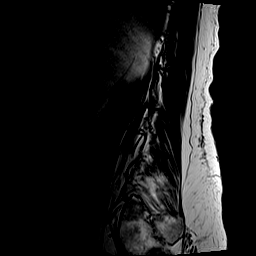

[Series 7: STIR · sagittal · 4.0mm · 0.51mm/px · 3 of 16 slices shown]
[im 1/16]
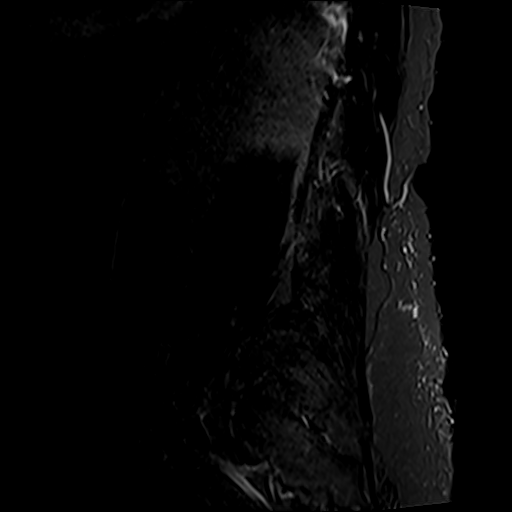
[im 4/16]
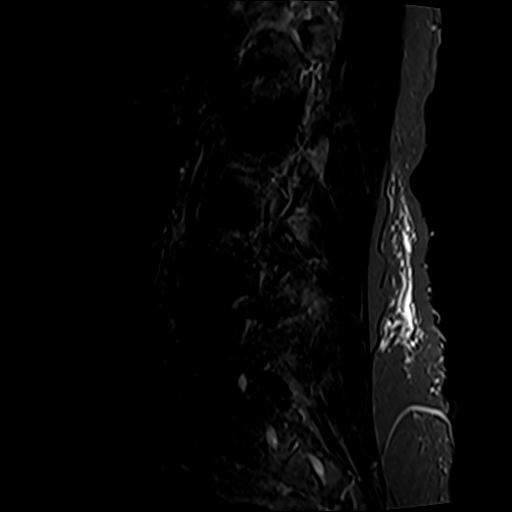
[im 7/16]
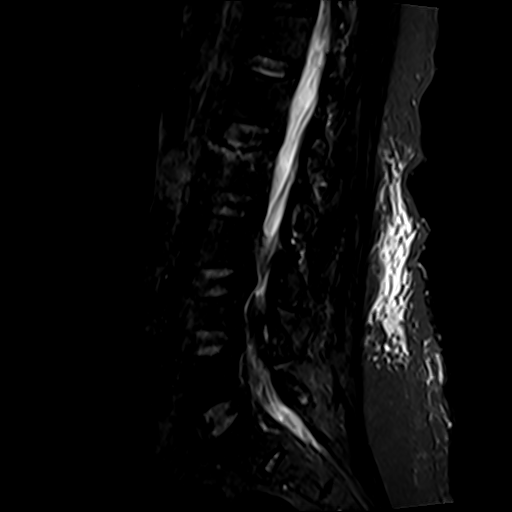

[Series 8: T2 · axial · 4.0mm · 0.78mm/px · z∈[-102,+121]mm · 9 of 40 slices shown (2 of 2)]
[im 1/40]
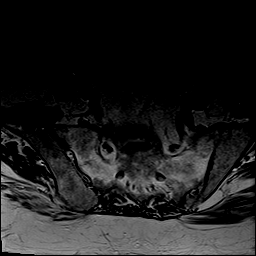
[im 6/40]
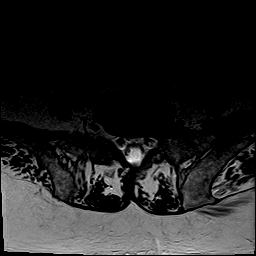
[im 12/40]
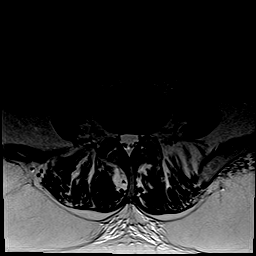
[im 17/40]
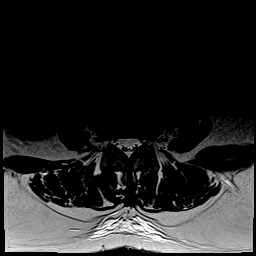
[im 20/40]
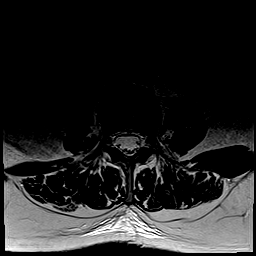
[im 23/40]
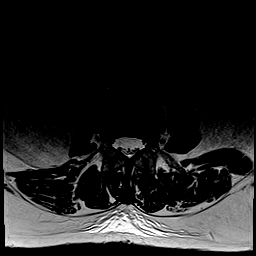
[im 28/40]
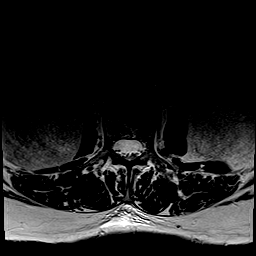
[im 34/40]
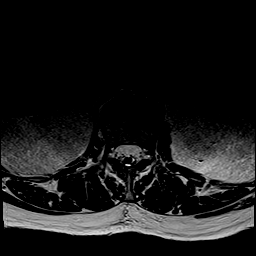
[im 40/40]
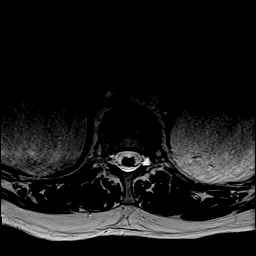

[Series 9: T1 · axial · 4.0mm · 0.39mm/px · z∈[-102,+121]mm · 9 of 40 slices shown (2 of 2)]
[im 1/40]
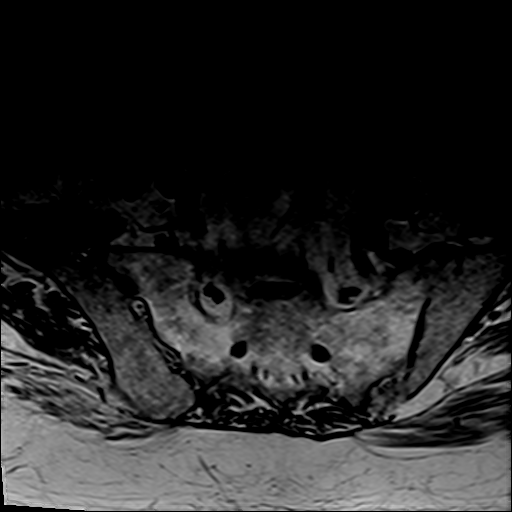
[im 6/40]
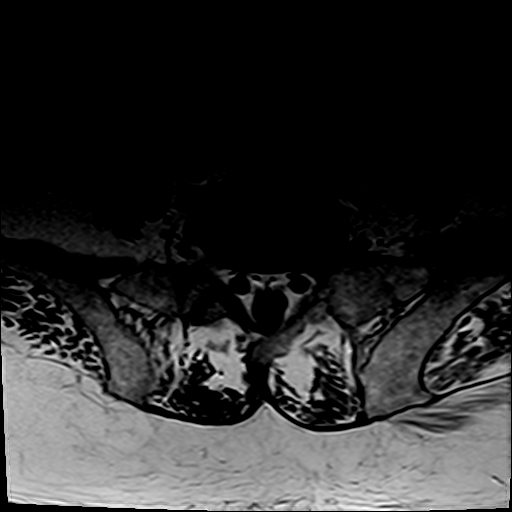
[im 12/40]
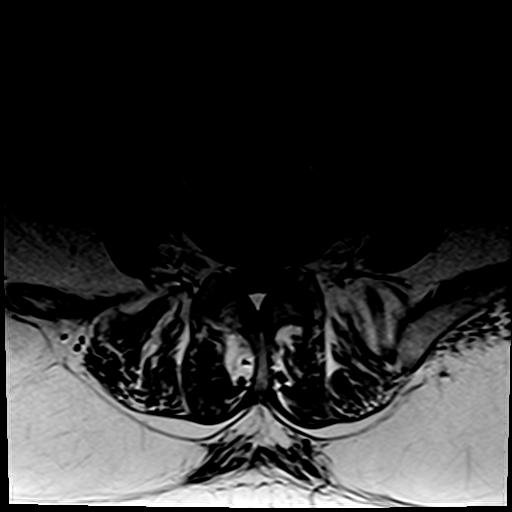
[im 17/40]
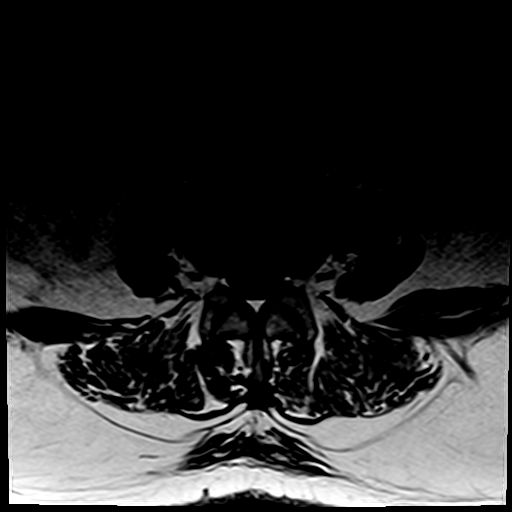
[im 20/40]
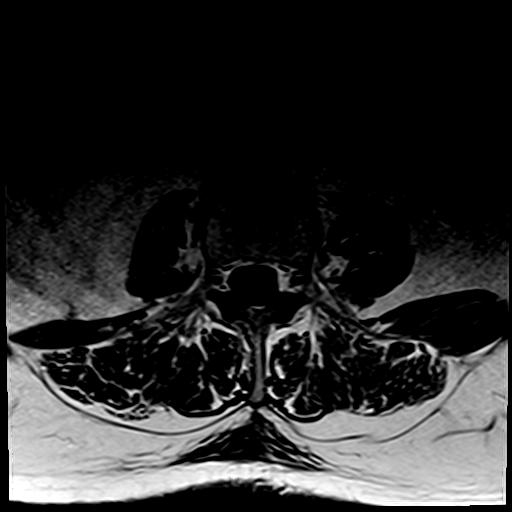
[im 23/40]
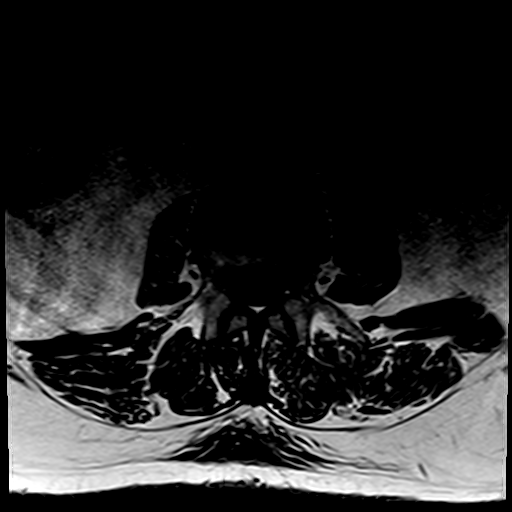
[im 28/40]
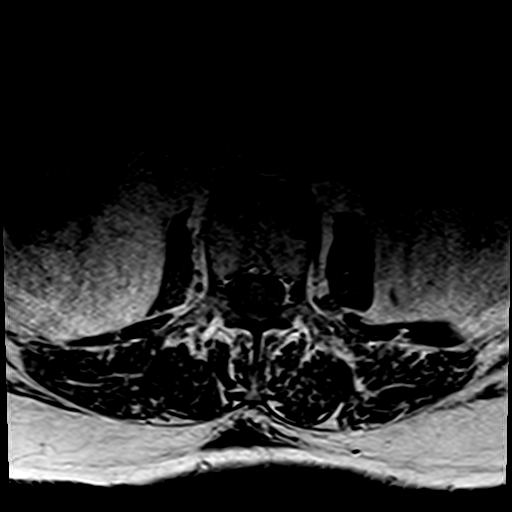
[im 34/40]
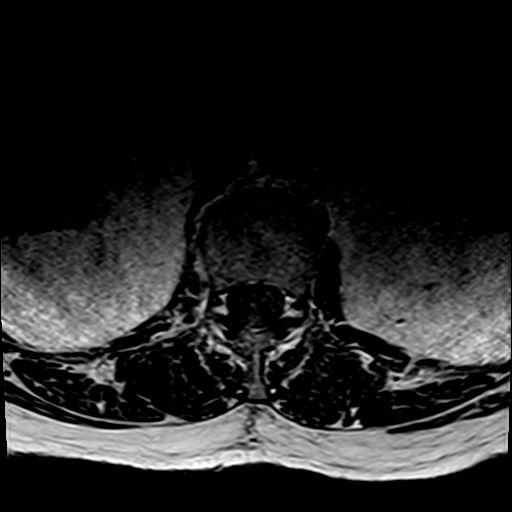
[im 40/40]
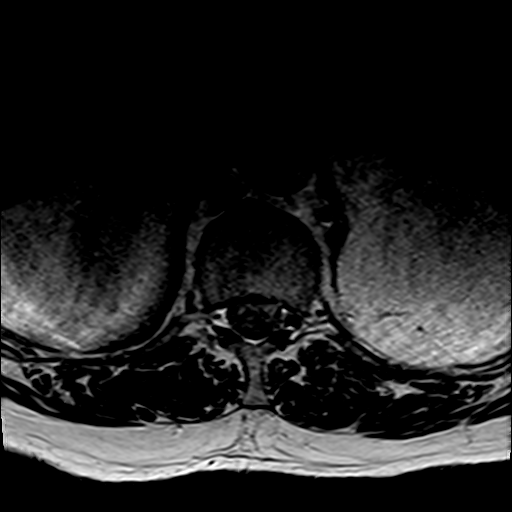

[33 of 48 positions shown; findings below may reference images not displayed]

FINDINGS: Segmentation:  Standard.

Alignment:  Physiologic.

Vertebrae: Compression fracture of the L2 superior endplate
resulting in loss of approximately 30% vertebral body height with
associated marrow edema, consistent with acute/subacute fracture.
There is no retropulsion. Chronic compression fracture of the L4
vertebral body with approximately 50% height loss height loss and no
significant retropulsion. No evidence of discitis or aggressive bone
lesion.

Conus medullaris and cauda equina: Conus extends to the L1-2 level.
Conus and cauda equina appear normal.

Paraspinal and other soft tissues: Negative.

Disc levels:

T12-L1: No spinal canal or neural foraminal stenosis.

L1-2: No spinal canal or neural foraminal stenosis.

L2-3: Mild facet degenerative changes. No spinal canal or neural
foraminal stenosis.

L3-4: Disc bulge and mild facet degenerative changes without
significant spinal canal or neural foraminal stenosis.

L4-5: Shallow disc bulge and mild facet degenerative changes without
significant spinal canal or neural foraminal stenosis.

L5-S1: Mild facet degenerative changes. No spinal canal or neural
foraminal stenosis.
IMPRESSION: 1. Acute/subacute compression fracture of the L2 vertebral body with
approximately 30% height loss. No retropulsion.
2. Chronic compression fracture of the L4 vertebral body resulting
in approximately 50% height loss without retropulsion.
3. Mild degenerative changes of the lumbar spine without high-grade
spinal canal or neural foraminal stenosis at any level.
